# Patient Record
Sex: Male | Born: 1970
Health system: Southern US, Community
[De-identification: ages and names within clinical notes are randomized; demographics above are authoritative.]

## PROBLEM LIST (undated history)

## (undated) DIAGNOSIS — Z21 Asymptomatic human immunodeficiency virus [HIV] infection status: Secondary | ICD-10-CM

## (undated) DIAGNOSIS — B20 Human immunodeficiency virus [HIV] disease: Secondary | ICD-10-CM

## (undated) DIAGNOSIS — R0600 Dyspnea, unspecified: Secondary | ICD-10-CM

## (undated) DIAGNOSIS — Z992 Dependence on renal dialysis: Secondary | ICD-10-CM

## (undated) DIAGNOSIS — F419 Anxiety disorder, unspecified: Secondary | ICD-10-CM

## (undated) DIAGNOSIS — I10 Essential (primary) hypertension: Secondary | ICD-10-CM

## (undated) DIAGNOSIS — E1140 Type 2 diabetes mellitus with diabetic neuropathy, unspecified: Secondary | ICD-10-CM

## (undated) DIAGNOSIS — E119 Type 2 diabetes mellitus without complications: Secondary | ICD-10-CM

## (undated) DIAGNOSIS — N186 End stage renal disease: Secondary | ICD-10-CM

## (undated) DIAGNOSIS — N189 Chronic kidney disease, unspecified: Secondary | ICD-10-CM

## (undated) HISTORY — DX: Essential (primary) hypertension: I10

## (undated) HISTORY — PX: AV FISTULA PLACEMENT: SHX1204

## (undated) HISTORY — DX: Asymptomatic human immunodeficiency virus (hiv) infection status: Z21

## (undated) HISTORY — DX: Human immunodeficiency virus (HIV) disease: B20

## (undated) SURGERY — ABDOMINAL AORTOGRAM
Anesthesia: Moderate Sedation | Laterality: Right

---

## 2013-04-24 ENCOUNTER — Ambulatory Visit: Payer: Self-pay

## 2013-05-01 ENCOUNTER — Ambulatory Visit: Payer: Self-pay

## 2013-05-02 ENCOUNTER — Telehealth: Payer: Self-pay

## 2013-05-02 NOTE — Telephone Encounter (Signed)
Pt called he is not able to make his appointment on Tuesday, May 01, 2013 he would like to reschedule.   Left message to call with available dates for intakes.   Laverle Patter, RN

## 2013-07-06 ENCOUNTER — Telehealth: Payer: Self-pay

## 2013-07-06 NOTE — Telephone Encounter (Signed)
Patient contacted regarding new intake.  [address] Counselor has contacted him regarding two missed intake appointments and he has assured her he will come for office visit.    Appointment given to patient.  He is un insured .  Explained Financial counselor will be calling to give him information regarding services offered.   Laverle Patter, RN

## 2013-07-17 ENCOUNTER — Ambulatory Visit: Payer: Self-pay

## 2013-07-19 ENCOUNTER — Ambulatory Visit (INDEPENDENT_AMBULATORY_CARE_PROVIDER_SITE_OTHER): Payer: Self-pay

## 2013-07-19 ENCOUNTER — Ambulatory Visit: Payer: Self-pay

## 2013-07-19 DIAGNOSIS — B20 Human immunodeficiency virus [HIV] disease: Secondary | ICD-10-CM

## 2013-07-19 DIAGNOSIS — Z79899 Other long term (current) drug therapy: Secondary | ICD-10-CM

## 2013-07-19 DIAGNOSIS — Z113 Encounter for screening for infections with a predominantly sexual mode of transmission: Secondary | ICD-10-CM

## 2013-07-19 DIAGNOSIS — E119 Type 2 diabetes mellitus without complications: Secondary | ICD-10-CM

## 2013-07-19 DIAGNOSIS — Z23 Encounter for immunization: Secondary | ICD-10-CM

## 2013-07-19 LAB — CBC WITH DIFFERENTIAL/PLATELET
Eosinophils Relative: 1 % (ref 0–5)
HCT: 36.7 % — ABNORMAL LOW (ref 39.0–52.0)
Hemoglobin: 12.2 g/dL — ABNORMAL LOW (ref 13.0–17.0)
Lymphocytes Relative: 44 % (ref 12–46)
Lymphs Abs: 3.1 10*3/uL (ref 0.7–4.0)
MCV: 83 fL (ref 78.0–100.0)
Monocytes Absolute: 0.8 10*3/uL (ref 0.1–1.0)
Monocytes Relative: 11 % (ref 3–12)
RBC: 4.42 MIL/uL (ref 4.22–5.81)
RDW: 15.1 % (ref 11.5–15.5)
WBC: 7 10*3/uL (ref 4.0–10.5)

## 2013-07-19 LAB — COMPLETE METABOLIC PANEL WITH GFR
BUN: 14 mg/dL (ref 6–23)
CO2: 26 mEq/L (ref 19–32)
Calcium: 8.8 mg/dL (ref 8.4–10.5)
Chloride: 98 mEq/L (ref 96–112)
Creat: 1.08 mg/dL (ref 0.50–1.35)
GFR, Est African American: >89 mL/min

## 2013-07-19 LAB — LIPID PANEL
Cholesterol: 156 mg/dL (ref 0–200)
HDL: 34 mg/dL — ABNORMAL LOW (ref >39)
LDL Cholesterol: 62 mg/dL (ref 0–99)
Triglycerides: 299 mg/dL — ABNORMAL HIGH (ref <150)

## 2013-07-20 LAB — URINALYSIS
Bilirubin Urine: NEGATIVE
Ketones, ur: NEGATIVE mg/dL
Nitrite: NEGATIVE
Specific Gravity, Urine: 1.026 (ref 1.005–1.030)
pH: 5.5 (ref 5.0–8.0)

## 2013-07-20 LAB — T-HELPER CELL (CD4) - (RCID CLINIC ONLY)
CD4 % Helper T Cell: 9 % — ABNORMAL LOW (ref 33–55)
CD4 T Cell Abs: 320 /uL — ABNORMAL LOW (ref 400–2700)

## 2013-07-20 LAB — HEPATITIS A ANTIBODY, TOTAL: Hep A Total Ab: POSITIVE — AB

## 2013-07-20 LAB — HEMOGLOBIN A1C: Mean Plasma Glucose: 367 mg/dL — ABNORMAL HIGH (ref <117)

## 2013-07-20 LAB — RPR

## 2013-07-20 LAB — HEPATITIS B SURFACE ANTIGEN: Hepatitis B Surface Ag: NEGATIVE

## 2013-07-24 LAB — TB SKIN TEST: Induration: 0 mm

## 2013-07-24 NOTE — Progress Notes (Signed)
Patient states he was not informed of his HIV status prior to discharge from hospital in March, 2014. He only found out later when seeing a primary care physician for diabetes.  He has not been in care for HIV due to lack of insurance and a recent move to [place].   He has not been in care for his hypertension or diabetes and  has been borrowing insulin from a friend . He has not checked his blood sugar in several months. There is a  sore present left lower leg that has not healed for a least one month per patient report.  He will need  assitance with obtaining insulin and blood glucose machine. I think he would benefit from seeing Internal Medicine for assistance with diabetes and hypertension. Vaccines given.    Laverle Patter, RN

## 2013-07-25 LAB — HIV-1 GENOTYPR PLUS

## 2013-08-07 ENCOUNTER — Ambulatory Visit: Payer: Self-pay | Admitting: Internal Medicine

## 2013-08-07 ENCOUNTER — Ambulatory Visit: Payer: Self-pay

## 2013-08-10 ENCOUNTER — Telehealth: Payer: Self-pay

## 2013-08-10 NOTE — Telephone Encounter (Signed)
Patient came in for intake on 07/19/13 - had some of his financial docouments, heleft one job and went right to next one and stated was going to fax or email his other paystubsW2/2013 taxes - called several times and left vm messages - he did not return calls or show for Dr. Hilaria Ota on 9/30.

## 2013-09-17 ENCOUNTER — Telehealth: Payer: Self-pay

## 2013-09-17 NOTE — Telephone Encounter (Signed)
Patient missed appointment with provider.  Informed Photographer.  Laverle Patter, RN

## 2014-04-09 ENCOUNTER — Telehealth: Payer: Self-pay

## 2014-04-09 NOTE — Telephone Encounter (Signed)
Patient did not show for office visit. [place] Counselor Referral.   Laverle Patter, RN

## 2015-03-05 DIAGNOSIS — E11628 Type 2 diabetes mellitus with other skin complications: Secondary | ICD-10-CM | POA: Insufficient documentation

## 2015-03-05 DIAGNOSIS — L089 Local infection of the skin and subcutaneous tissue, unspecified: Secondary | ICD-10-CM | POA: Insufficient documentation

## 2015-03-05 DIAGNOSIS — E1065 Type 1 diabetes mellitus with hyperglycemia: Secondary | ICD-10-CM | POA: Insufficient documentation

## 2015-03-05 DIAGNOSIS — R809 Proteinuria, unspecified: Secondary | ICD-10-CM | POA: Insufficient documentation

## 2015-03-05 DIAGNOSIS — D509 Iron deficiency anemia, unspecified: Secondary | ICD-10-CM | POA: Insufficient documentation

## 2015-03-09 DIAGNOSIS — K8020 Calculus of gallbladder without cholecystitis without obstruction: Secondary | ICD-10-CM | POA: Insufficient documentation

## 2015-03-19 ENCOUNTER — Ambulatory Visit: Payer: Self-pay | Admitting: Family Medicine

## 2016-05-31 DIAGNOSIS — R069 Unspecified abnormalities of breathing: Secondary | ICD-10-CM | POA: Insufficient documentation

## 2016-05-31 DIAGNOSIS — Z95828 Presence of other vascular implants and grafts: Secondary | ICD-10-CM | POA: Insufficient documentation

## 2016-05-31 DIAGNOSIS — E213 Hyperparathyroidism, unspecified: Secondary | ICD-10-CM | POA: Insufficient documentation

## 2016-05-31 DIAGNOSIS — R52 Pain, unspecified: Secondary | ICD-10-CM | POA: Insufficient documentation

## 2016-05-31 DIAGNOSIS — N186 End stage renal disease: Secondary | ICD-10-CM | POA: Insufficient documentation

## 2016-05-31 DIAGNOSIS — Z23 Encounter for immunization: Secondary | ICD-10-CM | POA: Insufficient documentation

## 2016-05-31 DIAGNOSIS — D509 Iron deficiency anemia, unspecified: Secondary | ICD-10-CM | POA: Insufficient documentation

## 2016-05-31 DIAGNOSIS — L299 Pruritus, unspecified: Secondary | ICD-10-CM | POA: Insufficient documentation

## 2016-05-31 DIAGNOSIS — N189 Chronic kidney disease, unspecified: Secondary | ICD-10-CM | POA: Insufficient documentation

## 2016-05-31 DIAGNOSIS — D631 Anemia in chronic kidney disease: Secondary | ICD-10-CM | POA: Insufficient documentation

## 2016-05-31 DIAGNOSIS — R509 Fever, unspecified: Secondary | ICD-10-CM | POA: Insufficient documentation

## 2016-05-31 DIAGNOSIS — D689 Coagulation defect, unspecified: Secondary | ICD-10-CM | POA: Insufficient documentation

## 2016-05-31 DIAGNOSIS — E1122 Type 2 diabetes mellitus with diabetic chronic kidney disease: Secondary | ICD-10-CM | POA: Insufficient documentation

## 2016-05-31 DIAGNOSIS — B20 Human immunodeficiency virus [HIV] disease: Secondary | ICD-10-CM | POA: Insufficient documentation

## 2016-05-31 DIAGNOSIS — I158 Other secondary hypertension: Secondary | ICD-10-CM | POA: Insufficient documentation

## 2016-07-06 DIAGNOSIS — E875 Hyperkalemia: Secondary | ICD-10-CM | POA: Insufficient documentation

## 2016-07-08 DIAGNOSIS — E440 Moderate protein-calorie malnutrition: Secondary | ICD-10-CM | POA: Insufficient documentation

## 2016-07-14 DIAGNOSIS — E8779 Other fluid overload: Secondary | ICD-10-CM | POA: Insufficient documentation

## 2016-07-15 ENCOUNTER — Other Ambulatory Visit: Payer: Self-pay | Admitting: Vascular Surgery

## 2016-07-19 ENCOUNTER — Ambulatory Visit: Admission: RE | Admit: 2016-07-19 | Payer: Medicaid Other | Source: Ambulatory Visit | Admitting: Vascular Surgery

## 2016-07-19 SURGERY — DIALYSIS/PERMA CATHETER REMOVAL
Anesthesia: Moderate Sedation

## 2016-08-09 ENCOUNTER — Other Ambulatory Visit
Admission: RE | Admit: 2016-08-09 | Discharge: 2016-08-09 | Disposition: A | Discharge status: Home / Self Care | Payer: Medicaid Other | Source: Ambulatory Visit | Attending: Nephrology | Admitting: Nephrology

## 2016-08-09 DIAGNOSIS — E875 Hyperkalemia: Secondary | ICD-10-CM | POA: Diagnosis not present

## 2016-08-09 LAB — POTASSIUM: Potassium: 5.6 mmol/L — ABNORMAL HIGH (ref 3.5–5.1)

## 2016-08-30 DIAGNOSIS — A528 Late syphilis, latent: Secondary | ICD-10-CM | POA: Insufficient documentation

## 2016-10-26 ENCOUNTER — Other Ambulatory Visit
Admission: RE | Admit: 2016-10-26 | Discharge: 2016-10-26 | Disposition: A | Discharge status: Home / Self Care | Payer: Medicaid Other | Source: Ambulatory Visit | Attending: Nephrology | Admitting: Nephrology

## 2016-10-26 DIAGNOSIS — E875 Hyperkalemia: Secondary | ICD-10-CM | POA: Insufficient documentation

## 2016-10-26 LAB — POTASSIUM: POTASSIUM: 5.8 mmol/L — AB (ref 3.5–5.1)

## 2016-12-24 DIAGNOSIS — K029 Dental caries, unspecified: Secondary | ICD-10-CM | POA: Insufficient documentation

## 2017-01-31 ENCOUNTER — Encounter: Payer: Self-pay | Admitting: *Deleted

## 2017-01-31 ENCOUNTER — Emergency Department: Payer: Medicaid Other

## 2017-01-31 ENCOUNTER — Inpatient Hospital Stay
Admission: EM | Admit: 2017-01-31 | Discharge: 2017-02-08 | DRG: 969 | Disposition: A | Discharge status: Home Health | Payer: Medicaid Other | Attending: Internal Medicine | Admitting: Internal Medicine

## 2017-01-31 DIAGNOSIS — Z794 Long term (current) use of insulin: Secondary | ICD-10-CM | POA: Diagnosis not present

## 2017-01-31 DIAGNOSIS — E1140 Type 2 diabetes mellitus with diabetic neuropathy, unspecified: Secondary | ICD-10-CM | POA: Diagnosis present

## 2017-01-31 DIAGNOSIS — E440 Moderate protein-calorie malnutrition: Secondary | ICD-10-CM | POA: Insufficient documentation

## 2017-01-31 DIAGNOSIS — E1169 Type 2 diabetes mellitus with other specified complication: Secondary | ICD-10-CM | POA: Diagnosis present

## 2017-01-31 DIAGNOSIS — I120 Hypertensive chronic kidney disease with stage 5 chronic kidney disease or end stage renal disease: Secondary | ICD-10-CM | POA: Diagnosis present

## 2017-01-31 DIAGNOSIS — L97526 Non-pressure chronic ulcer of other part of left foot with bone involvement without evidence of necrosis: Secondary | ICD-10-CM | POA: Diagnosis present

## 2017-01-31 DIAGNOSIS — A419 Sepsis, unspecified organism: Secondary | ICD-10-CM | POA: Diagnosis present

## 2017-01-31 DIAGNOSIS — D631 Anemia in chronic kidney disease: Secondary | ICD-10-CM | POA: Diagnosis present

## 2017-01-31 DIAGNOSIS — N186 End stage renal disease: Secondary | ICD-10-CM | POA: Diagnosis present

## 2017-01-31 DIAGNOSIS — M869 Osteomyelitis, unspecified: Secondary | ICD-10-CM | POA: Diagnosis present

## 2017-01-31 DIAGNOSIS — E1122 Type 2 diabetes mellitus with diabetic chronic kidney disease: Secondary | ICD-10-CM | POA: Diagnosis present

## 2017-01-31 DIAGNOSIS — E11621 Type 2 diabetes mellitus with foot ulcer: Secondary | ICD-10-CM | POA: Diagnosis present

## 2017-01-31 DIAGNOSIS — Z7982 Long term (current) use of aspirin: Secondary | ICD-10-CM | POA: Diagnosis not present

## 2017-01-31 DIAGNOSIS — Z79899 Other long term (current) drug therapy: Secondary | ICD-10-CM

## 2017-01-31 DIAGNOSIS — Z833 Family history of diabetes mellitus: Secondary | ICD-10-CM | POA: Diagnosis not present

## 2017-01-31 DIAGNOSIS — B20 Human immunodeficiency virus [HIV] disease: Secondary | ICD-10-CM | POA: Diagnosis present

## 2017-01-31 DIAGNOSIS — S91309A Unspecified open wound, unspecified foot, initial encounter: Secondary | ICD-10-CM

## 2017-01-31 DIAGNOSIS — R778 Other specified abnormalities of plasma proteins: Secondary | ICD-10-CM

## 2017-01-31 DIAGNOSIS — N2581 Secondary hyperparathyroidism of renal origin: Secondary | ICD-10-CM | POA: Diagnosis present

## 2017-01-31 DIAGNOSIS — R7989 Other specified abnormal findings of blood chemistry: Secondary | ICD-10-CM

## 2017-01-31 DIAGNOSIS — Z8249 Family history of ischemic heart disease and other diseases of the circulatory system: Secondary | ICD-10-CM | POA: Diagnosis not present

## 2017-01-31 DIAGNOSIS — L089 Local infection of the skin and subcutaneous tissue, unspecified: Secondary | ICD-10-CM | POA: Diagnosis present

## 2017-01-31 DIAGNOSIS — L97523 Non-pressure chronic ulcer of other part of left foot with necrosis of muscle: Secondary | ICD-10-CM

## 2017-01-31 DIAGNOSIS — Z992 Dependence on renal dialysis: Secondary | ICD-10-CM

## 2017-01-31 HISTORY — DX: End stage renal disease: Z99.2

## 2017-01-31 HISTORY — DX: Type 2 diabetes mellitus without complications: E11.9

## 2017-01-31 HISTORY — DX: Type 2 diabetes mellitus with diabetic neuropathy, unspecified: E11.40

## 2017-01-31 HISTORY — DX: End stage renal disease: N18.6

## 2017-01-31 LAB — CBC WITH DIFFERENTIAL/PLATELET
Basophils Absolute: 0 10*3/uL (ref 0–0.1)
Basophils Relative: 0 %
EOS PCT: 0 %
Eosinophils Absolute: 0 10*3/uL (ref 0–0.7)
HEMATOCRIT: 34.2 % — AB (ref 40.0–52.0)
HEMOGLOBIN: 11.5 g/dL — AB (ref 13.0–18.0)
LYMPHS ABS: 1 10*3/uL (ref 1.0–3.6)
LYMPHS PCT: 6 %
MCH: 29.8 pg (ref 26.0–34.0)
MCHC: 33.6 g/dL (ref 32.0–36.0)
MCV: 88.7 fL (ref 80.0–100.0)
MONOS PCT: 6 %
Monocytes Absolute: 0.9 10*3/uL (ref 0.2–1.0)
NEUTROS ABS: 13.3 10*3/uL — AB (ref 1.4–6.5)
Neutrophils Relative %: 88 %
Platelets: 237 10*3/uL (ref 150–440)
RBC: 3.86 MIL/uL — ABNORMAL LOW (ref 4.40–5.90)
RDW: 17.5 % — AB (ref 11.5–14.5)
WBC: 15.2 10*3/uL — ABNORMAL HIGH (ref 3.8–10.6)

## 2017-01-31 LAB — COMPREHENSIVE METABOLIC PANEL
ALBUMIN: 3.3 g/dL — AB (ref 3.5–5.0)
ALT: 10 U/L — ABNORMAL LOW (ref 17–63)
ANION GAP: 10 (ref 5–15)
AST: 11 U/L — AB (ref 15–41)
Alkaline Phosphatase: 68 U/L (ref 38–126)
BILIRUBIN TOTAL: 0.7 mg/dL (ref 0.3–1.2)
BUN: 37 mg/dL — AB (ref 6–20)
CO2: 28 mmol/L (ref 22–32)
Calcium: 8.7 mg/dL — ABNORMAL LOW (ref 8.9–10.3)
Chloride: 94 mmol/L — ABNORMAL LOW (ref 101–111)
Creatinine, Ser: 9.34 mg/dL — ABNORMAL HIGH (ref 0.61–1.24)
GFR calc Af Amer: 7 mL/min — ABNORMAL LOW (ref >60)
GFR calc non Af Amer: 6 mL/min — ABNORMAL LOW (ref >60)
GLUCOSE: 182 mg/dL — AB (ref 65–99)
POTASSIUM: 4 mmol/L (ref 3.5–5.1)
SODIUM: 132 mmol/L — AB (ref 135–145)
Total Protein: 8.9 g/dL — ABNORMAL HIGH (ref 6.5–8.1)

## 2017-01-31 LAB — GLUCOSE, CAPILLARY: Glucose-Capillary: 203 mg/dL — ABNORMAL HIGH (ref 65–99)

## 2017-01-31 LAB — TROPONIN I: Troponin I: 0.03 ng/mL (ref <0.03)

## 2017-01-31 LAB — PROCALCITONIN: Procalcitonin: 127.27 ng/mL

## 2017-01-31 LAB — PROTIME-INR
INR: 1.23
PROTHROMBIN TIME: 15.6 s — AB (ref 11.4–15.2)

## 2017-01-31 LAB — LIPASE, BLOOD: Lipase: 11 U/L (ref 11–51)

## 2017-01-31 LAB — LACTIC ACID, PLASMA: Lactic Acid, Venous: 1.6 mmol/L (ref 0.5–1.9)

## 2017-01-31 MED ORDER — LOSARTAN POTASSIUM 50 MG PO TABS
100.0000 mg | ORAL_TABLET | Freq: Every day | ORAL | Status: DC
  Administered 2017-02-01 – 2017-02-08 (×8): 100 mg via ORAL
  Filled 2017-01-31 (×8): qty 2

## 2017-01-31 MED ORDER — ACETAMINOPHEN 650 MG RE SUPP: 650.0000 mg | Freq: Four times a day (QID) | RECTAL | Status: DC | PRN

## 2017-01-31 MED ORDER — VANCOMYCIN HCL IN DEXTROSE 1-5 GM/200ML-% IV SOLN
1000.0000 mg | Freq: Once | INTRAVENOUS | Status: AC
  Administered 2017-01-31: 1000 mg via INTRAVENOUS
  Filled 2017-01-31: qty 200

## 2017-01-31 MED ORDER — HYDRALAZINE HCL 50 MG PO TABS: 25.0000 mg | ORAL_TABLET | Freq: Three times a day (TID) | ORAL | Status: DC

## 2017-01-31 MED ORDER — DOLUTEGRAVIR SODIUM 50 MG PO TABS
50.0000 mg | ORAL_TABLET | Freq: Two times a day (BID) | ORAL | Status: DC
Start: 2017-01-31 — End: 2017-02-08
  Administered 2017-01-31 – 2017-02-08 (×15): 50 mg via ORAL
  Filled 2017-01-31 (×15): qty 1

## 2017-01-31 MED ORDER — MINOXIDIL 2.5 MG PO TABS
2.5000 mg | ORAL_TABLET | Freq: Two times a day (BID) | ORAL | Status: DC
  Administered 2017-02-01 – 2017-02-08 (×11): 2.5 mg via ORAL
  Filled 2017-01-31 (×15): qty 1

## 2017-01-31 MED ORDER — PIPERACILLIN-TAZOBACTAM 3.375 G IVPB
3.3750 g | Freq: Two times a day (BID) | INTRAVENOUS | Status: DC
  Administered 2017-02-01 – 2017-02-08 (×15): 3.375 g via INTRAVENOUS
  Filled 2017-01-31 (×16): qty 50

## 2017-01-31 MED ORDER — CARVEDILOL 25 MG PO TABS
25.0000 mg | ORAL_TABLET | Freq: Two times a day (BID) | ORAL | Status: DC
  Administered 2017-02-01 – 2017-02-08 (×12): 25 mg via ORAL
  Filled 2017-01-31 (×14): qty 1

## 2017-01-31 MED ORDER — INSULIN ASPART 100 UNIT/ML ~~LOC~~ SOLN
0.0000 [IU] | Freq: Three times a day (TID) | SUBCUTANEOUS | Status: DC
  Administered 2017-02-01: 1 [IU] via SUBCUTANEOUS
  Administered 2017-02-01 – 2017-02-02 (×2): 2 [IU] via SUBCUTANEOUS
  Administered 2017-02-03 – 2017-02-07 (×6): 1 [IU] via SUBCUTANEOUS
  Administered 2017-02-08: 2 [IU] via SUBCUTANEOUS
  Filled 2017-01-31: qty 1
  Filled 2017-01-31: qty 2
  Filled 2017-01-31 (×2): qty 1
  Filled 2017-01-31 (×2): qty 2
  Filled 2017-01-31 (×2): qty 1
  Filled 2017-01-31 (×2): qty 2
  Filled 2017-01-31: qty 1

## 2017-01-31 MED ORDER — PIPERACILLIN-TAZOBACTAM 3.375 G IVPB 30 MIN
3.3750 g | Freq: Once | INTRAVENOUS | Status: AC
  Administered 2017-01-31: 3.375 g via INTRAVENOUS
  Filled 2017-01-31: qty 50

## 2017-01-31 MED ORDER — SODIUM CHLORIDE 0.9 % IV BOLUS (SEPSIS): 1000.0000 mL | Freq: Once | INTRAVENOUS | Status: DC

## 2017-01-31 MED ORDER — ONDANSETRON HCL 4 MG PO TABS: 4.0000 mg | ORAL_TABLET | Freq: Four times a day (QID) | ORAL | Status: DC | PRN

## 2017-01-31 MED ORDER — SENNOSIDES-DOCUSATE SODIUM 8.6-50 MG PO TABS: 1.0000 | ORAL_TABLET | Freq: Every evening | ORAL | Status: DC | PRN

## 2017-01-31 MED ORDER — INSULIN ASPART 100 UNIT/ML ~~LOC~~ SOLN
0.0000 [IU] | Freq: Every day | SUBCUTANEOUS | Status: DC
  Administered 2017-01-31: 2 [IU] via SUBCUTANEOUS
  Filled 2017-01-31: qty 2

## 2017-01-31 MED ORDER — ABACAVIR SULFATE 300 MG PO TABS
300.0000 mg | ORAL_TABLET | Freq: Two times a day (BID) | ORAL | Status: DC
  Administered 2017-01-31 – 2017-02-08 (×15): 300 mg via ORAL
  Filled 2017-01-31 (×16): qty 1

## 2017-01-31 MED ORDER — SODIUM CHLORIDE 0.9 % IV BOLUS (SEPSIS): 500.0000 mL | Freq: Once | INTRAVENOUS | Status: DC

## 2017-01-31 MED ORDER — ALBUTEROL SULFATE (2.5 MG/3ML) 0.083% IN NEBU: 2.5000 mg | INHALATION_SOLUTION | RESPIRATORY_TRACT | Status: DC | PRN

## 2017-01-31 MED ORDER — ACETAMINOPHEN 325 MG PO TABS: 650.0000 mg | ORAL_TABLET | Freq: Four times a day (QID) | ORAL | Status: DC | PRN

## 2017-01-31 MED ORDER — SODIUM CHLORIDE 0.9% FLUSH: 3.0000 mL | INTRAVENOUS | Status: DC | PRN

## 2017-01-31 MED ORDER — LAMIVUDINE 10 MG/ML PO SOLN
25.0000 mg | Freq: Every day | ORAL | Status: DC
  Administered 2017-02-01 – 2017-02-07 (×7): 25 mg via ORAL
  Filled 2017-01-31 (×10): qty 5

## 2017-01-31 MED ORDER — SODIUM CHLORIDE 0.9 % IV BOLUS (SEPSIS)
1000.0000 mL | Freq: Once | INTRAVENOUS | Status: AC
  Administered 2017-01-31: 1000 mL via INTRAVENOUS

## 2017-01-31 MED ORDER — SODIUM CHLORIDE 0.9 % IV SOLN
250.0000 mL | INTRAVENOUS | Status: DC | PRN
  Administered 2017-02-04: 13:00:00 via INTRAVENOUS

## 2017-01-31 MED ORDER — HYDROCODONE-ACETAMINOPHEN 5-325 MG PO TABS
1.0000 | ORAL_TABLET | ORAL | Status: DC | PRN
  Administered 2017-02-05 – 2017-02-08 (×3): 2 via ORAL
  Filled 2017-01-31 (×3): qty 2

## 2017-01-31 MED ORDER — ONDANSETRON HCL 4 MG/2ML IJ SOLN: 4.0000 mg | Freq: Four times a day (QID) | INTRAMUSCULAR | Status: DC | PRN

## 2017-01-31 MED ORDER — CALCIUM ACETATE (PHOS BINDER) 667 MG PO CAPS
667.0000 mg | ORAL_CAPSULE | Freq: Three times a day (TID) | ORAL | Status: DC
  Administered 2017-02-01 – 2017-02-06 (×11): 667 mg via ORAL
  Filled 2017-01-31 (×11): qty 1

## 2017-01-31 MED ORDER — ASPIRIN 81 MG PO CHEW
81.0000 mg | CHEWABLE_TABLET | Freq: Every day | ORAL | Status: DC
  Administered 2017-02-01 – 2017-02-08 (×8): 81 mg via ORAL
  Filled 2017-01-31 (×8): qty 1

## 2017-01-31 MED ORDER — HEPARIN SODIUM (PORCINE) 5000 UNIT/ML IJ SOLN
5000.0000 [IU] | Freq: Three times a day (TID) | INTRAMUSCULAR | Status: DC
Start: 2017-01-31 — End: 2017-02-08
  Administered 2017-01-31 – 2017-02-08 (×19): 5000 [IU] via SUBCUTANEOUS
  Filled 2017-01-31 (×21): qty 1

## 2017-01-31 MED ORDER — SODIUM CHLORIDE 0.9% FLUSH
3.0000 mL | Freq: Two times a day (BID) | INTRAVENOUS | Status: DC
  Administered 2017-01-31 – 2017-02-08 (×13): 3 mL via INTRAVENOUS

## 2017-01-31 NOTE — ED Notes (Signed)
Spoke with lab regarding blood work sent at approximately 1545. Reports they just received it and will process it now. MD made aware.

## 2017-01-31 NOTE — ED Triage Notes (Addendum)
Pt to triage via wheelchair.  Pt reports fever, chills, cough today.  Pt received only part of  dialysis today.  Pt has a cough.  Pt alert. Pt took tylenol at 1100am today.  Pt now reports an ulcer on left foot.   Pt is diabetic.  Pt taken to room 11 from triage.

## 2017-01-31 NOTE — ED Notes (Signed)
NAD. Waiting on admit bed. Pt alert and oriented. No needs at this time.

## 2017-01-31 NOTE — Progress Notes (Signed)
Pharmacy Antibiotic Note  Nathan Ayers is a 46 y.o. male admitted on 01/31/2017 with sepsis.  Pharmacy has been consulted for vancomycin and piperacillin/tazobactam dosing.  Patient has ESRD on HD.  Plan: Patient received vancomycin 1000 mg at 1600. Will order another 1000 mg dose to be given NOW for total loading dose of 2000 mg. Pharmacy will need to follow HD schedule and order 1000 mg to be given at the end of each HD session  Goal vancomycin level 15-25 mcg/mL pre HD - to be checked prior to 3rd session per protocol.  Piperacillin/tazobactam 3.375 g IV q12h EI  Height: 6' 6"  (198.1 cm) Weight: 222 lb 10.6 oz (101 kg) IBW/kg (Calculated) : 91.4  Temp (24hrs), Avg:103.1 F (39.5 C), Min:103.1 F (39.5 C), Max:103.1 F (39.5 C)   Recent Labs Lab 01/31/17 1600  WBC 15.2*  CREATININE 9.34*  LATICACIDVEN 1.6    Estimated Creatinine Clearance: 13.2 mL/min (A) (by C-G formula based on SCr of 9.34 mg/dL (H)).    No Known Allergies  Antimicrobials this admission: Piperacillin/tazobactam 3/26 >>  vancomycin 3/26 >>   Dose adjustments this admission:  Microbiology results: 3/26 BCx: Sent 3/26 UCx: Sent   Thank you for allowing pharmacy to be a part of this patient's care.  Lenis Noon, PharmD Clinical Pharmacist 01/31/2017 6:44 PM

## 2017-01-31 NOTE — ED Notes (Signed)
Code sepsis called to [facility] at [place]

## 2017-01-31 NOTE — ED Notes (Signed)
Code sepsis called to [facility] at [place]

## 2017-01-31 NOTE — ED Provider Notes (Signed)
[facility] Emergency Department Provider Note  ____________________________________________   First MD Initiated Contact with Patient 01/31/17 1523     (approximate)  I have reviewed the triage vital signs and the nursing notes.   HISTORY  Chief Complaint Fever and Code Sepsis    HPI Nathan Ayers is a 46 y.o. male whose past medical history includes HIV infection on antiretroviral meds, chronic kidney disease on dialysis, and diabetes with chronic neuropathy who receives all of his care at [facility] including infectious disease.  He presents today for evaluation of fever, chills,and a large wound on his left foot that is apparently been present and gradually worsening for at least weeks if not months.  He started having chills but no fever as of about 4 days ago.  Over the last couple of days he started having fevers and today was as high as 104.  He has been nauseated and had several episodes of vomiting.  He has had a mild cough but denies difficulty breathing and denies chest pain.  He had a short course of dialysis today before they sent him here for further evaluation of his fever.  He reports that the wound on the bottom of his foot was present about a year ago and got better.  He thinks it developed again over the last few weeks or even months but has not had anyone look at it.  It is open and draining a combination of liquid and purulent material.  He has severe neuropathy so he cannot feel it.  Nothing makes his symptoms better nor worse but he describes them as severe at this time.  He reports that his CD4 count and viral load were last checked about 6 months ago and they told him the viral load was undetectable.  He is not sure about the CD4 count at that time.   Past Medical History:  Diagnosis Date  . Chronic kidney disease on chronic dialysis ([place])   . Diabetes mellitus ([place])   . Diabetic neuropathy ([place])   . HIV infection ([place])   . Hypertension       Patient Active Problem List   Diagnosis Date Noted  . Sepsis ([place]) 01/31/2017    History reviewed. No pertinent surgical history.  Prior to Admission medications   Medication Sig Start Date End Date Taking? Authorizing Provider  abacavir (ZIAGEN) 300 MG tablet Take 1 tablet by mouth 2 (two) times daily. 11/12/16  Yes Historical Provider, MD  calcium acetate (PHOSLO) 667 MG capsule Take 1 capsule by mouth 3 (three) times daily. 11/06/16  Yes Historical Provider, MD  carvedilol (COREG) 25 MG tablet Take 1 tablet by mouth every 12 (twelve) hours. 01/02/17  Yes Historical Provider, MD  insulin aspart (NOVOLOG) 100 UNIT/ML injection Inject into the skin 3 (three) times daily with meals.   Yes Historical Provider, MD  lamiVUDine (EPIVIR) 10 MG/ML solution Take 2.5 mLs by mouth daily. Take 2.21m daily, after dialysis and on days of dialysis 01/02/17  Yes Historical Provider, MD  losartan (COZAAR) 100 MG tablet Take 100 mg by mouth daily. 06/28/16 06/28/17 Yes Historical Provider, MD  minoxidil (LONITEN) 2.5 MG tablet Take 2.5 mg by mouth 2 (two) times daily. 01/23/17  Yes Historical Provider, MD  ondansetron (ZOFRAN-ODT) 4 MG disintegrating tablet Take 1 tablet by mouth every 8 (eight) hours as needed. 02/24/16  Yes Historical Provider, MD  TIVICAY 50 MG tablet Take 1 tablet by mouth 2 (two) times daily. 01/02/17  Yes  Historical Provider, MD  aspirin 81 MG chewable tablet Chew 81 mg by mouth daily.    Historical Provider, MD  hydrALAZINE (APRESOLINE) 25 MG tablet Take 1 tablet by mouth every 8 (eight) hours. 02/24/16 02/23/17  Historical Provider, MD    Allergies Patient has no known allergies.  Family History  Problem Relation Age of Onset  . Diabetes Mother   . Hypertension Mother     Social History Social History  Substance Use Topics  . Smoking status: Never Smoker  . Smokeless tobacco: Never Used  . Alcohol use No    Review of Systems Constitutional: +fever/chills, MAXIMUM  TEMPERATURE of 104 prior to arrival and 103 and ED triage Eyes: No visual changes. ENT: No sore throat. Cardiovascular: Denies chest pain. Respiratory: Denies shortness of breath.  Mild cough Gastrointestinal: No abdominal pain.  Several episodes of nausea and vomiting.  No diarrhea.  No constipation. Genitourinary: Negative for dysuria. Musculoskeletal: Negative for back pain. Skin: Negative for rash.  Large wound on the bottom of his left foot Neurological: Mild global headache.  No focal weakness.  Chronic peripheral neuropathy  10-point ROS otherwise negative.  ____________________________________________   PHYSICAL EXAM:  VITAL SIGNS: ED Triage Vitals  Enc Vitals Group     BP 01/31/17 1507 (!) 113/48     Pulse Rate 01/31/17 1507 (!) 107     Resp 01/31/17 1507 20     Temp 01/31/17 1507 (!) 103.1 F (39.5 C)     Temp Source 01/31/17 1507 Oral     SpO2 01/31/17 1507 99 %     Weight 01/31/17 1507 222 lb 10.6 oz (101 kg)     Height 01/31/17 1507 6' 6"  (1.981 m)     Head Circumference --      Peak Flow --      Pain Score 01/31/17 1506 0     Pain Loc --      Pain Edu? --      Excl. in [place]? --     Constitutional: Alert and oriented. Ill appearing but does not appear toxic. Eyes: Conjunctivae are normal. PERRL. EOMI. Head: Atraumatic. Nose: No congestion/rhinnorhea. Mouth/Throat: Mucous membranes are moist. Neck: No stridor.  No meningeal signs.   Cardiovascular: Normal rate, regular rhythm. Good peripheral circulation. Grossly normal heart sounds. Respiratory: Normal respiratory effort.  No retractions. Lungs CTAB. Gastrointestinal: Soft and nontender. No distention.  Musculoskeletal: No lower extremity tenderness nor edema.  The patient has a large (approximately 4 cm in diameter) and deep chronic appearing wound on the bottom of his left foot.  It is oozing serosanguineous material as well as having some gangrenous-appearing tissue.   Neurologic:  Normal speech and  language. No gross focal neurologic deficits are appreciated except for chronic peripheral neuropathy. Psychiatric: Mood and affect are normal. Speech and behavior are normal.  ____________________________________________   LABS (all labs ordered are listed, but only abnormal results are displayed)  Labs Reviewed  PROTIME-INR - Abnormal; Notable for the following:       Result Value   Prothrombin Time 15.6 (*)    All other components within normal limits  TROPONIN I - Abnormal; Notable for the following:    Troponin I 0.03 (*)    All other components within normal limits  COMPREHENSIVE METABOLIC PANEL - Abnormal; Notable for the following:    Sodium 132 (*)    Chloride 94 (*)    Glucose, Bld 182 (*)    BUN 37 (*)    Creatinine, Ser  9.34 (*)    Calcium 8.7 (*)    Total Protein 8.9 (*)    Albumin 3.3 (*)    AST 11 (*)    ALT 10 (*)    GFR calc non Af Amer 6 (*)    GFR calc Af Amer 7 (*)    All other components within normal limits  CBC WITH DIFFERENTIAL/PLATELET - Abnormal; Notable for the following:    WBC 15.2 (*)    RBC 3.86 (*)    Hemoglobin 11.5 (*)    HCT 34.2 (*)    RDW 17.5 (*)    Neutro Abs 13.3 (*)    All other components within normal limits  CULTURE, BLOOD (ROUTINE X 2)  CULTURE, BLOOD (ROUTINE X 2)  AEROBIC/ANAEROBIC CULTURE (SURGICAL/DEEP WOUND)  LIPASE, BLOOD  PROCALCITONIN  LACTIC ACID, PLASMA  BASIC METABOLIC PANEL  CBC  HIV ANTIBODY (ROUTINE TESTING)  HEMOGLOBIN A1C   ____________________________________________  EKG  ED ECG REPORT I, Alisi Lupien, the attending physician, personally viewed and interpreted this ECG.  Date: 01/31/2017 EKG Time: 15:59 Rate: 103 Rhythm: borderline sinus tachycardia QRS Axis: normal Intervals: normal ST/T Wave abnormalities: Non-specific ST segment / T-wave changes, but no evidence of acute ischemia. Conduction Disturbances: none Narrative Interpretation:  unremarkable  ____________________________________________  RADIOLOGY   Dg Chest Portable 1 View  Result Date: 01/31/2017 CLINICAL DATA:  Code sepsis EXAM: PORTABLE CHEST 1 VIEW COMPARISON:  None. FINDINGS: Top-normal heart size. Normal mediastinal contour. No pneumothorax. No pleural effusion. Lungs appear clear, with no acute consolidative airspace disease and no pulmonary edema. IMPRESSION: No active disease. Electronically Signed   By: Ilona Sorrel M.D.   On: 01/31/2017 16:08   Dg Foot Complete Left  Result Date: 01/31/2017 CLINICAL DATA:  Ulcer overlying the distal aspect of the fifth metatarsal bone. Rule out osteomyelitis. EXAM: LEFT FOOT - COMPLETE 3+ VIEW COMPARISON:  None. FINDINGS: There is a large also or adjacent to the undersurface of the distal aspect of the fifth metatarsal bone and fifth MTP joints. There is no focal areas of bony destruction. No fractures or dislocation. IMPRESSION: 1. Large ulcer overlying the distal aspect of the fifth metatarsal. No underlying bony destruction identified. Electronically Signed   By: Kerby Moors M.D.   On: 01/31/2017 16:09    ____________________________________________   PROCEDURES  Critical Care performed: Yes, see critical care procedure note(s)   Procedure(s) performed:   .Critical Care Performed by: Hinda Kehr Authorized by: Hinda Kehr   Critical care provider statement:    Critical care time (minutes):  30   Critical care time was exclusive of:  Separately billable procedures and treating other patients   Critical care was necessary to treat or prevent imminent or life-threatening deterioration of the following conditions:  Sepsis   Critical care was time spent personally by me on the following activities:  Development of treatment plan with patient or surrogate, discussions with consultants, evaluation of patient's response to treatment, examination of patient, obtaining history from patient or surrogate, ordering  and performing treatments and interventions, ordering and review of laboratory studies, ordering and review of radiographic studies, pulse oximetry, re-evaluation of patient's condition and review of old charts      ____________________________________________   INITIAL IMPRESSION / [place] / ED COURSE  Pertinent labs & imaging results that were available during my care of the patient were reviewed by me and considered in my medical decision making (see chart for details).  The patient is not in septic  shock but does meet criteria for sepsis.  He is immunocompromised with HIV and febrile to 103 with an obvious wound on his foot suspicious for osteomyelitis.  I have made him a code sepsis and will treat aggressively with empiric antibiotics as well as IV fluids until his lactic acid results, but I need to be judicious with fluids given that he is a dialysis patient and did not receive a full course of dialysis today.  At this time he has no shortness of breath and no other signs of infectious processes.  He has no tenderness to palpation of his abdomen.  He has had no indication of influenza.  Because the patient receives all his care including infectious disease at [facility], I ask him his preference for transfer versus staying at [facility], and at this time he prefers to stay here.   Clinical Course as of Feb 01 1943  Mon Jan 31, 2017  1635 Normal CXR, no evidence of osteomyelitis on foot radiographs.  Awaiting lab results.  [CF]  1704 Leukocytosis, but lactic acid WNL.  Meets sepsis criteria and has HIV.  Will discuss with hospitalist.  Given no evidence of shock, will discontinue 1m/kg fluid bolus and stick with 1.5L target at this time given infection and persistent tachycardia.  No SOB.  Will discuss with hospitalist.  [CF]    Clinical Course User Index [CF] CHinda Kehr MD    ____________________________________________  FINAL CLINICAL  IMPRESSION(S) / ED DIAGNOSES  Final diagnoses:  Wound, open, foot  Sepsis, due to unspecified organism ([place]  HIV (human immunodeficiency virus infection) ([place]  Chronic kidney disease on chronic dialysis ([place]  Elevated troponin I level     MEDICATIONS GIVEN DURING THIS VISIT:  Medications  heparin injection 5,000 Units (not administered)  acetaminophen (TYLENOL) tablet 650 mg (not administered)    Or  acetaminophen (TYLENOL) suppository 650 mg (not administered)  ondansetron (ZOFRAN) tablet 4 mg (not administered)    Or  ondansetron (ZOFRAN) injection 4 mg (not administered)  albuterol (PROVENTIL) (2.5 MG/3ML) 0.083% nebulizer solution 2.5 mg (not administered)  sodium chloride flush (NS) 0.9 % injection 3 mL (not administered)  sodium chloride flush (NS) 0.9 % injection 3 mL (not administered)  0.9 %  sodium chloride infusion (not administered)  HYDROcodone-acetaminophen (NORCO/VICODIN) 5-325 MG per tablet 1-2 tablet (not administered)  senna-docusate (Senokot-S) tablet 1 tablet (not administered)  abacavir (ZIAGEN) tablet 300 mg (not administered)  aspirin chewable tablet 81 mg (not administered)  calcium acetate (PHOSLO) capsule 667 mg (not administered)  carvedilol (COREG) tablet 25 mg (not administered)  hydrALAZINE (APRESOLINE) tablet 25 mg (not administered)  lamiVUDine (EPIVIR) 10 MG/ML solution 25 mg (not administered)  losartan (COZAAR) tablet 100 mg (not administered)  minoxidil (LONITEN) tablet 2.5 mg (not administered)  dolutegravir (TIVICAY) tablet 50 mg (not administered)  insulin aspart (novoLOG) injection 0-9 Units (not administered)  insulin aspart (novoLOG) injection 0-5 Units (not administered)  piperacillin-tazobactam (ZOSYN) IVPB 3.375 g (not administered)  vancomycin (VANCOCIN) IVPB 1000 mg/200 mL premix (not administered)  sodium chloride 0.9 % bolus 1,000 mL (0 mLs Intravenous Stopped 01/31/17 1722)  piperacillin-tazobactam (ZOSYN) IVPB 3.375 g (0 g  Intravenous Stopped 01/31/17 1639)  vancomycin (VANCOCIN) IVPB 1000 mg/200 mL premix (0 mg Intravenous Stopped 01/31/17 1710)     NEW OUTPATIENT MEDICATIONS STARTED DURING THIS VISIT:  Current Discharge Medication List      Current Discharge Medication List      Current Discharge Medication List  Note:  This document was prepared using Dragon voice recognition software and may include unintentional dictation errors.    Hinda Kehr, MD 01/31/17 1944

## 2017-01-31 NOTE — ED Notes (Signed)
Report received 

## 2017-01-31 NOTE — Plan of Care (Signed)
Problem: Physical Regulation: Goal: Will remain free from infection Outcome: Progressing IV antibiotics started in ED

## 2017-01-31 NOTE — H&P (Signed)
[facility] NAME: Nathan Ayers    MR#:  [id number]  DATE OF BIRTH:  [birth year]  DATE OF ADMISSION:  01/31/2017  PRIMARY CARE PHYSICIAN: No PCP Per Patient   REQUESTING/REFERRING PHYSICIAN: Hinda Kehr, MD  CHIEF COMPLAINT:   Chief Complaint  Patient presents with  . Fever  . Code Sepsis   Fever and chills for several days. HISTORY OF PRESENT ILLNESS:  Nathan Ayers  is a 46 y.o. male with a known history of Hypertension, diabetes, diabetes neuropathy, ESRD on hemodialysis and HIV. The patient has had fever, chills for a few days. His temperature went up to 104 for the past couple of days. He also complains of nausea and vomiting several times. He denies any diarrhea, abdominal pain, melena or bloody stool. He has had a large wound on his left foot full at least weeks if not months. She got 2 hours hemodialysis today and then sent to the ED for further evaluation. He hasn't seen physician for long time. He said he is compliance to his medication. He was also found septic, given vancomycin and Zosyn in the ED.  PAST MEDICAL HISTORY:   Past Medical History:  Diagnosis Date  . Chronic kidney disease on chronic dialysis ([place])   . Diabetes mellitus ([place])   . Diabetic neuropathy ([place])   . HIV infection ([place])   . Hypertension     PAST SURGICAL HISTORY:  History reviewed. No pertinent surgical history.  SOCIAL HISTORY:   Social History  Substance Use Topics  . Smoking status: Never Smoker  . Smokeless tobacco: Never Used  . Alcohol use No    FAMILY HISTORY:   Family History  Problem Relation Age of Onset  . Diabetes Mother   . Hypertension Mother     DRUG ALLERGIES:  No Known Allergies  REVIEW OF SYSTEMS:   Review of Systems  Constitutional: Positive for chills, fever and malaise/fatigue.  HENT: Negative for congestion.   Eyes: Negative for blurred vision and double vision.  Respiratory: Negative for cough,  shortness of breath, wheezing and stridor.   Cardiovascular: Negative for chest pain, palpitations and leg swelling.  Gastrointestinal: Positive for nausea. Negative for abdominal pain, blood in stool, constipation, diarrhea, melena and vomiting.  Genitourinary: Negative for dysuria and hematuria.  Musculoskeletal: Negative for back pain.  Skin: Negative for itching and rash.  Neurological: Positive for weakness. Negative for dizziness, focal weakness, loss of consciousness and headaches.  Psychiatric/Behavioral: Negative for depression. The patient is not nervous/anxious.     MEDICATIONS AT HOME:   Prior to Admission medications   Medication Sig Start Date End Date Taking? Authorizing Provider  abacavir (ZIAGEN) 300 MG tablet Take 1 tablet by mouth 2 (two) times daily. 11/12/16  Yes Historical Provider, MD  calcium acetate (PHOSLO) 667 MG capsule Take 1 capsule by mouth 3 (three) times daily. 11/06/16  Yes Historical Provider, MD  carvedilol (COREG) 25 MG tablet Take 1 tablet by mouth every 12 (twelve) hours. 01/02/17  Yes Historical Provider, MD  insulin aspart (NOVOLOG) 100 UNIT/ML injection Inject into the skin 3 (three) times daily with meals.   Yes Historical Provider, MD  lamiVUDine (EPIVIR) 10 MG/ML solution Take 2.5 mLs by mouth daily. Take 2.71m daily, after dialysis and on days of dialysis 01/02/17  Yes Historical Provider, MD  losartan (COZAAR) 100 MG tablet Take 100 mg by mouth daily. 06/28/16 06/28/17 Yes Historical Provider, MD  minoxidil (LONITEN) 2.5 MG tablet  Take 2.5 mg by mouth 2 (two) times daily. 01/23/17  Yes Historical Provider, MD  ondansetron (ZOFRAN-ODT) 4 MG disintegrating tablet Take 1 tablet by mouth every 8 (eight) hours as needed. 02/24/16  Yes Historical Provider, MD  TIVICAY 50 MG tablet Take 1 tablet by mouth 2 (two) times daily. 01/02/17  Yes Historical Provider, MD  aspirin 81 MG chewable tablet Chew 81 mg by mouth daily.    Historical Provider, MD  hydrALAZINE  (APRESOLINE) 25 MG tablet Take 1 tablet by mouth every 8 (eight) hours. 02/24/16 02/23/17  Historical Provider, MD      VITAL SIGNS:  Blood pressure 115/65, pulse 99, temperature (!) 103.1 F (39.5 C), temperature source Oral, resp. rate 18, height 6' 6"  (1.981 m), weight 222 lb 10.6 oz (101 kg), SpO2 95 %.  PHYSICAL EXAMINATION:  Physical Exam  GENERAL:  46 y.o.-year-old patient lying in the bed with no acute distress.  EYES: Pupils equal, round, reactive to light and accommodation. No scleral icterus. Extraocular muscles intact.  HEENT: Head atraumatic, normocephalic. Oropharynx and nasopharynx clear.  NECK:  Supple, no jugular venous distention. No thyroid enlargement, no tenderness.  LUNGS: Normal breath sounds bilaterally, no wheezing, rales,rhonchi or crepitation. No use of accessory muscles of respiration.  CARDIOVASCULAR: S1, S2 normal. No murmurs, rubs, or gallops.  ABDOMEN: Soft, nontender, nondistended. Bowel sounds present. No organomegaly or mass.  EXTREMITIES: No pedal edema, cyanosis, or clubbing. left foot palm ulcer 2 cm in diameter, stage IV with discharge. NEUROLOGIC: Cranial nerves II through XII are intact. Muscle strength 5/5 in all extremities. Sensation intact. Gait not checked.  PSYCHIATRIC: The patient is alert and oriented x 3.  SKIN: No obvious rash, lesion, or ulcer.   LABORATORY PANEL:   CBC  Recent Labs Lab 01/31/17 1600  WBC 15.2*  HGB 11.5*  HCT 34.2*  PLT 237   ------------------------------------------------------------------------------------------------------------------  Chemistries   Recent Labs Lab 01/31/17 1600  NA 132*  K 4.0  CL 94*  CO2 28  GLUCOSE 182*  BUN 37*  CREATININE 9.34*  CALCIUM 8.7*  AST 11*  ALT 10*  ALKPHOS 68  BILITOT 0.7   ------------------------------------------------------------------------------------------------------------------  Cardiac Enzymes  Recent Labs Lab 01/31/17 1600  TROPONINI 0.03*    ------------------------------------------------------------------------------------------------------------------  RADIOLOGY:  Dg Chest Portable 1 View  Result Date: 01/31/2017 CLINICAL DATA:  Code sepsis EXAM: PORTABLE CHEST 1 VIEW COMPARISON:  None. FINDINGS: Top-normal heart size. Normal mediastinal contour. No pneumothorax. No pleural effusion. Lungs appear clear, with no acute consolidative airspace disease and no pulmonary edema. IMPRESSION: No active disease. Electronically Signed   By: Ilona Sorrel M.D.   On: 01/31/2017 16:08   Dg Foot Complete Left  Result Date: 01/31/2017 CLINICAL DATA:  Ulcer overlying the distal aspect of the fifth metatarsal bone. Rule out osteomyelitis. EXAM: LEFT FOOT - COMPLETE 3+ VIEW COMPARISON:  None. FINDINGS: There is a large also or adjacent to the undersurface of the distal aspect of the fifth metatarsal bone and fifth MTP joints. There is no focal areas of bony destruction. No fractures or dislocation. IMPRESSION: 1. Large ulcer overlying the distal aspect of the fifth metatarsal. No underlying bony destruction identified. Electronically Signed   By: Kerby Moors M.D.   On: 01/31/2017 16:09      IMPRESSION AND PLAN:   Sepsis due to left foot ulcer infection. The patient will be admitted to medical floor. Continue Zosyn and vancomycin, follow-up CBC and cultures, follow-up infectious disease consult and podiatry consult.  ESRD on  hemodialysis. Follow-up nephrology for hemodialysis.  Diabetes. Start sliding scale and check hemoglobin A1c.  Hypertension. Continue hypertension medication.  HIV. Continue HIV medications and ID consult.  All the records are reviewed and case discussed with ED provider. Management plans discussed with the patient, family and they are in agreement.  CODE STATUS: Full code  TOTAL TIME TAKING CARE OF THIS PATIENT: 56 minutes.    Nathan Ayers M.D on 01/31/2017 at 5:47 PM  Between 7am to 6pm - Pager -  [phone]  After 6pm go to [email or web address] - [facility]  Office  [phone]  CC: Primary care physician; No PCP Per Patient   Note: This dictation was prepared with Dragon dictation along with smaller phrase technology. Any transcriptional errors that result from this process are unintentional.

## 2017-02-01 ENCOUNTER — Inpatient Hospital Stay: Payer: Medicaid Other

## 2017-02-01 DIAGNOSIS — E440 Moderate protein-calorie malnutrition: Secondary | ICD-10-CM | POA: Insufficient documentation

## 2017-02-01 LAB — HEMOGLOBIN A1C
Hgb A1c MFr Bld: 6.1 % — ABNORMAL HIGH (ref 4.8–5.6)
Mean Plasma Glucose: 128 mg/dL

## 2017-02-01 LAB — CBC
HEMATOCRIT: 28.6 % — AB (ref 40.0–52.0)
Hemoglobin: 9.5 g/dL — ABNORMAL LOW (ref 13.0–18.0)
MCH: 29.5 pg (ref 26.0–34.0)
MCHC: 33.4 g/dL (ref 32.0–36.0)
MCV: 88.5 fL (ref 80.0–100.0)
PLATELETS: 212 10*3/uL (ref 150–440)
RBC: 3.23 MIL/uL — AB (ref 4.40–5.90)
RDW: 17.7 % — AB (ref 11.5–14.5)
WBC: 20.1 10*3/uL — ABNORMAL HIGH (ref 3.8–10.6)

## 2017-02-01 LAB — GLUCOSE, CAPILLARY
GLUCOSE-CAPILLARY: 97 mg/dL (ref 65–99)
GLUCOSE-CAPILLARY: 156 mg/dL — AB (ref 65–99)
GLUCOSE-CAPILLARY: 167 mg/dL — AB (ref 65–99)
Glucose-Capillary: 137 mg/dL — ABNORMAL HIGH (ref 65–99)

## 2017-02-01 LAB — BASIC METABOLIC PANEL
Anion gap: 10 (ref 5–15)
BUN: 46 mg/dL — ABNORMAL HIGH (ref 6–20)
CHLORIDE: 95 mmol/L — AB (ref 101–111)
CO2: 28 mmol/L (ref 22–32)
CREATININE: 11.49 mg/dL — AB (ref 0.61–1.24)
Calcium: 8 mg/dL — ABNORMAL LOW (ref 8.9–10.3)
GFR calc Af Amer: 5 mL/min — ABNORMAL LOW (ref >60)
GFR calc non Af Amer: 5 mL/min — ABNORMAL LOW (ref >60)
Glucose, Bld: 95 mg/dL (ref 65–99)
Potassium: 4.3 mmol/L (ref 3.5–5.1)
Sodium: 133 mmol/L — ABNORMAL LOW (ref 135–145)

## 2017-02-01 LAB — MRSA PCR SCREENING: MRSA by PCR: NEGATIVE

## 2017-02-01 MED ORDER — ALTEPLASE 2 MG IJ SOLR: 2.0000 mg | Freq: Once | INTRAMUSCULAR | Status: DC | PRN

## 2017-02-01 MED ORDER — NEPRO/CARBSTEADY PO LIQD: 237.0000 mL | ORAL | Status: DC | PRN

## 2017-02-01 MED ORDER — PRO-STAT SUGAR FREE PO LIQD
30.0000 mL | Freq: Two times a day (BID) | ORAL | Status: DC
  Administered 2017-02-01 – 2017-02-03 (×5): 30 mL via ORAL

## 2017-02-01 MED ORDER — HEPARIN SODIUM (PORCINE) 1000 UNIT/ML DIALYSIS
1000.0000 [IU] | INTRAMUSCULAR | Status: DC | PRN
  Filled 2017-02-01: qty 1

## 2017-02-01 MED ORDER — LIDOCAINE HCL (PF) 1 % IJ SOLN
5.0000 mL | INTRAMUSCULAR | Status: DC | PRN
  Filled 2017-02-01: qty 5

## 2017-02-01 MED ORDER — SODIUM CHLORIDE 0.9 % IV SOLN: 100.0000 mL | INTRAVENOUS | Status: DC | PRN

## 2017-02-01 MED ORDER — LIDOCAINE-PRILOCAINE 2.5-2.5 % EX CREA
1.0000 "application " | TOPICAL_CREAM | CUTANEOUS | Status: DC | PRN
  Filled 2017-02-01: qty 5

## 2017-02-01 NOTE — Consult Note (Signed)
ORTHOPAEDIC CONSULTATION  REQUESTING PHYSICIAN: Henreitta Leber, MD  Chief Complaint: Left foot infection  HPI: Nathan Ayers is a 46 y.o. male who complains of  left foot infection. Patient has a history of diabetes with end-stage renal disease on dialysis and HIV. He has noted neuropathy to the lower extremities. Has not seen a foot doctor in some time. Has had a chronic ulcer on his left foot. Has not seen anyone about this but noticed worsening drainage and foul odor over the last several days. Admitted to the hospital with code sepsis. Elevated white blood cell count of 20,000.  Past Medical History:  Diagnosis Date  . Chronic kidney disease on chronic dialysis ([place])   . Diabetes mellitus ([place])   . Diabetic neuropathy ([place])   . HIV infection ([place])   . Hypertension    History reviewed. No pertinent surgical history. Social History   Social History  . Marital status: Single    Spouse name: N/A  . Number of children: N/A  . Years of education: N/A   Social History Main Topics  . Smoking status: Never Smoker  . Smokeless tobacco: Never Used  . Alcohol use No  . Drug use: No  . Sexual activity: Not Currently    Partners: Male    Birth control/ protection: Condom   Other Topics Concern  . None   Social History Narrative  . None   Family History  Problem Relation Age of Onset  . Diabetes Mother   . Hypertension Mother    No Known Allergies Prior to Admission medications   Medication Sig Start Date End Date Taking? Authorizing Provider  abacavir (ZIAGEN) 300 MG tablet Take 1 tablet by mouth 2 (two) times daily. 11/12/16  Yes Historical Provider, MD  calcium acetate (PHOSLO) 667 MG capsule Take 1 capsule by mouth 3 (three) times daily. 11/06/16  Yes Historical Provider, MD  carvedilol (COREG) 25 MG tablet Take 1 tablet by mouth every 12 (twelve) hours. 01/02/17  Yes Historical Provider, MD  insulin aspart (NOVOLOG) 100 UNIT/ML injection Inject into the skin 3 (three)  times daily with meals.   Yes Historical Provider, MD  lamiVUDine (EPIVIR) 10 MG/ML solution Take 2.5 mLs by mouth daily. Take 2.25m daily, after dialysis and on days of dialysis 01/02/17  Yes Historical Provider, MD  losartan (COZAAR) 100 MG tablet Take 100 mg by mouth daily. 06/28/16 06/28/17 Yes Historical Provider, MD  minoxidil (LONITEN) 2.5 MG tablet Take 2.5 mg by mouth 2 (two) times daily. 01/23/17  Yes Historical Provider, MD  ondansetron (ZOFRAN-ODT) 4 MG disintegrating tablet Take 1 tablet by mouth every 8 (eight) hours as needed. 02/24/16  Yes Historical Provider, MD  TIVICAY 50 MG tablet Take 1 tablet by mouth 2 (two) times daily. 01/02/17  Yes Historical Provider, MD  aspirin 81 MG chewable tablet Chew 81 mg by mouth daily.    Historical Provider, MD  hydrALAZINE (APRESOLINE) 25 MG tablet Take 1 tablet by mouth every 8 (eight) hours. 02/24/16 02/23/17  Historical Provider, MD   Dg Chest Portable 1 View  Result Date: 01/31/2017 CLINICAL DATA:  Code sepsis EXAM: PORTABLE CHEST 1 VIEW COMPARISON:  None. FINDINGS: Top-normal heart size. Normal mediastinal contour. No pneumothorax. No pleural effusion. Lungs appear clear, with no acute consolidative airspace disease and no pulmonary edema. IMPRESSION: No active disease. Electronically Signed   By: JIlona SorrelM.D.   On: 01/31/2017 16:08   Dg Foot Complete Left  Result Date: 01/31/2017 CLINICAL DATA:  Ulcer overlying the distal aspect of the fifth metatarsal bone. Rule out osteomyelitis. EXAM: LEFT FOOT - COMPLETE 3+ VIEW COMPARISON:  None. FINDINGS: There is a large also or adjacent to the undersurface of the distal aspect of the fifth metatarsal bone and fifth MTP joints. There is no focal areas of bony destruction. No fractures or dislocation. IMPRESSION: 1. Large ulcer overlying the distal aspect of the fifth metatarsal. No underlying bony destruction identified. Electronically Signed   By: Kerby Moors M.D.   On: 01/31/2017 16:09     Positive ROS: All other systems have been reviewed and were otherwise negative with the exception of those mentioned in the HPI and as above.  12 point ROS was performed.  Physical Exam: General: Alert and oriented.  No apparent distress.  Vascular:  Left foot:Dorsalis Pedis:  present Posterior Tibial:  present  Right foot: Dorsalis Pedis:  present Posterior Tibial:  present  Neuro:absent objective sensation  Derm: Right foot without ulceration  Left foot with full-thickness necrotic tissue centimeter ulceration under the left fifth MTPJ. This probes directly down to bone. Foul odor and purulent drainage noted.  Ortho/MS: No pain with range of motion. Edema to the left foot is noted.  X-rays: Left foot x-ray does not show obvious erosive changes. Some osteopenia at the ulcerative site at the left fifth metatarsal head.   Assessment: Diabetic neuropathic ulceration left fifth metatarsal head probes directly to bone with necrosis of muscle and soft tissue.  Plan: Deep wound culture was performed. This does probe directly down to bone and I was worried osteomyelitis. The very necrotic foul odor to the area. We'll order an MRI to evaluate for osteomyelitis. We discussed the possibility of fifth metatarsal head resection and/or amputation fifth toe based on MRI findings. I'll follow-up after the MRI has been performed. Dressing applied today    Elesa Hacker, DPM Cell [phone]   02/01/2017 1:03 PM

## 2017-02-01 NOTE — Plan of Care (Signed)
Problem: Pain Managment: Goal: General experience of comfort will improve Outcome: Progressing Patient has had no complaints of pain.

## 2017-02-01 NOTE — Consult Note (Signed)
[facility] Infectious Disease     Reason for Consult: DM foot infection, HIV    Referring Physician:  Estanislado Spire Date of Admission:  01/31/2017   Active Problems:   Sepsis ([place])   Malnutrition of moderate degree   HPI: Nathan Ayers is a 46 y.o. male with well controlled HIV, ESRD, DM with PN and HTN admitted with fevers, chills and NV. He had a large L foot wound progressive over last several months. He has not sought care for it. He has not been on otpt abx. Said had similar issue a year ago. He reports compliance with his HIV meds and last  Cd4 435, VL 01 Jan 2017. Follows with Dr Russ Halo at [facility].   Past Medical History:  Diagnosis Date  . Chronic kidney disease on chronic dialysis ([place])   . Diabetes mellitus ([place])   . Diabetic neuropathy ([place])   . HIV infection ([place])   . Hypertension    History reviewed. No pertinent surgical history. Social History  Substance Use Topics  . Smoking status: Never Smoker  . Smokeless tobacco: Never Used  . Alcohol use No   Family History  Problem Relation Age of Onset  . Diabetes Mother   . Hypertension Mother     Allergies: No Known Allergies  Current antibiotics: Antibiotics Given (last 72 hours)    Date/Time Action Medication Dose Rate   01/31/17 2118 Given   abacavir (ZIAGEN) tablet 300 mg 300 mg    01/31/17 2118 Given   dolutegravir (TIVICAY) tablet 50 mg 50 mg    01/31/17 2118 Given   vancomycin (VANCOCIN) IVPB 1000 mg/200 mL premix 1,000 mg 200 mL/hr   02/01/17 2725 Given   piperacillin-tazobactam (ZOSYN) IVPB 3.375 g 3.375 g 12.5 mL/hr   02/01/17 3664 Given  [per patient preference]   abacavir (ZIAGEN) tablet 300 mg 300 mg    02/01/17 4034 Given  [per patient preference]   dolutegravir (TIVICAY) tablet 50 mg 50 mg       MEDICATIONS: . abacavir  300 mg Oral BID  . aspirin  81 mg Oral Daily  . calcium acetate  667 mg Oral TID WC  . carvedilol  25 mg Oral BID WC  . dolutegravir  50 mg Oral BID  . feeding  supplement (PRO-STAT SUGAR FREE 64)  30 mL Oral BID  . heparin  5,000 Units Subcutaneous Q8H  . insulin aspart  0-5 Units Subcutaneous QHS  . insulin aspart  0-9 Units Subcutaneous TID WC  . lamiVUDine  25 mg Oral Daily  . losartan  100 mg Oral Daily  . minoxidil  2.5 mg Oral BID  . piperacillin-tazobactam (ZOSYN)  IV  3.375 g Intravenous Q12H  . sodium chloride flush  3 mL Intravenous Q12H    Review of Systems - 11 systems reviewed and negative per HPI   OBJECTIVE: Temp:  [98.3 F (36.8 C)-102.1 F (38.9 C)] 98.5 F (36.9 C) (03/27 1248) Pulse Rate:  [83-105] 83 (03/27 1248) Resp:  [14-23] 16 (03/27 1248) BP: (98-157)/(52-77) 116/53 (03/27 1248) SpO2:  [95 %-100 %] 99 % (03/27 1248) Weight:  [99.7 kg (219 lb 14.4 oz)] 99.7 kg (219 lb 14.4 oz) (03/26 1919) Physical Exam  Constitutional: He is oriented to person, place, and time. He appears well-developed and well-nourished. No distress.  HENT: anicteric  Mouth/Throat: Oropharynx is clear and moist. No oropharyngeal exudate.  Cardiovascular: Normal rate, regular rhythm and normal heart sounds.  Pulmonary/Chest: Effort normal and breath sounds normal. No  respiratory distress. He has no wheezes.  Abdominal: Soft. Bowel sounds are normal. He exhibits no distension. There is no tenderness.  Lymphadenopathy: He has no cervical adenopathy.  Neurological: He is alert and oriented to person, place, and time.  Skin: L foot with large 2-3 cm circular ulcer on plantar surface laterally with necrotic tissue, odor, drainage. Probes to bone.  Psychiatric: He has a flat  affect. His behavior is normal.     LABS: Results for orders placed or performed during the hospital encounter of 01/31/17 (from the past 48 hour(s))  Culture, blood (Routine x 2)     Status: None (Preliminary result)   Collection Time: 01/31/17  3:32 PM  Result Value Ref Range   Specimen Description BLOOD LEFT ANTECUBITAL    Special Requests      BOTTLES DRAWN AEROBIC  AND ANAEROBIC Blood Culture adequate volume   Culture NO GROWTH < 24 HOURS    Report Status PENDING   Culture, blood (Routine x 2)     Status: None (Preliminary result)   Collection Time: 01/31/17  3:32 PM  Result Value Ref Range   Specimen Description BLOOD LEFT FOREARM    Special Requests      BOTTLES DRAWN AEROBIC AND ANAEROBIC Blood Culture adequate volume   Culture NO GROWTH < 24 HOURS    Report Status PENDING   Hemoglobin A1c     Status: Abnormal   Collection Time: 01/31/17  3:32 PM  Result Value Ref Range   Hgb A1c MFr Bld 6.1 (H) 4.8 - 5.6 %    Comment: (NOTE)         Pre-diabetes: 5.7 - 6.4         Diabetes: >6.4         Glycemic control for adults with diabetes: <7.0    Mean Plasma Glucose 128 mg/dL    Comment: (NOTE) Performed At: [facility] [address] [place], [place] [id number] Lindon Romp MD OT:[id number]   Lipase, blood     Status: None   Collection Time: 01/31/17  4:00 PM  Result Value Ref Range   Lipase 11 11 - 51 U/L  Procalcitonin     Status: None   Collection Time: 01/31/17  4:00 PM  Result Value Ref Range   Procalcitonin 127.27 ng/mL    Comment:        Interpretation: PCT >= 10 ng/mL: Important systemic inflammatory response, almost exclusively due to severe bacterial sepsis or septic shock. (NOTE)         ICU PCT Algorithm               Non ICU PCT Algorithm    ----------------------------     ------------------------------         PCT < 0.25 ng/mL                 PCT < 0.1 ng/mL     Stopping of antibiotics            Stopping of antibiotics       strongly encouraged.               strongly encouraged.    ----------------------------     ------------------------------       PCT level decrease by               PCT < 0.25 ng/mL       >= 80% from peak PCT       OR PCT 0.25 - 0.5  ng/mL          Stopping of antibiotics                                             encouraged.     Stopping of antibiotics           encouraged.     ----------------------------     ------------------------------       PCT level decrease by              PCT >= 0.25 ng/mL       < 80% from peak PCT        AND PCT >= 0.5 ng/mL             Continuing antibiotics                                              encouraged.       Continuing antibiotics            encouraged.    ----------------------------     ------------------------------     PCT level increase compared          PCT > 0.5 ng/mL         with peak PCT AND          PCT >= 0.5 ng/mL             Escalation of antibiotics                                          strongly encouraged.      Escalation of antibiotics        strongly encouraged.   Protime-INR     Status: Abnormal   Collection Time: 01/31/17  4:00 PM  Result Value Ref Range   Prothrombin Time 15.6 (H) 11.4 - 15.2 seconds   INR 1.23   Troponin I     Status: Abnormal   Collection Time: 01/31/17  4:00 PM  Result Value Ref Range   Troponin I 0.03 (HH) <0.03 ng/mL    Comment: CRITICAL RESULT CALLED TO, READ BACK BY AND VERIFIED WITH LAURA CATES AT 1709 01/31/2017 BY TFK.   Comprehensive metabolic panel     Status: Abnormal   Collection Time: 01/31/17  4:00 PM  Result Value Ref Range   Sodium 132 (L) 135 - 145 mmol/L   Potassium 4.0 3.5 - 5.1 mmol/L   Chloride 94 (L) 101 - 111 mmol/L   CO2 28 22 - 32 mmol/L   Glucose, Bld 182 (H) 65 - 99 mg/dL   BUN 37 (H) 6 - 20 mg/dL   Creatinine, Ser 9.34 (H) 0.61 - 1.24 mg/dL   Calcium 8.7 (L) 8.9 - 10.3 mg/dL   Total Protein 8.9 (H) 6.5 - 8.1 g/dL   Albumin 3.3 (L) 3.5 - 5.0 g/dL   AST 11 (L) 15 - 41 U/L   ALT 10 (L) 17 - 63 U/L   Alkaline Phosphatase 68 38 - 126 U/L   Total Bilirubin 0.7 0.3 - 1.2 mg/dL   GFR calc non Af Amer 6 (L) >60 mL/min   GFR calc  Af Amer 7 (L) >60 mL/min    Comment: (NOTE) The eGFR has been calculated using the CKD EPI equation. This calculation has not been validated in all clinical situations. eGFR's persistently <60 mL/min signify possible  Chronic Kidney Disease.    Anion gap 10 5 - 15  CBC with Differential     Status: Abnormal   Collection Time: 01/31/17  4:00 PM  Result Value Ref Range   WBC 15.2 (H) 3.8 - 10.6 K/uL   RBC 3.86 (L) 4.40 - 5.90 MIL/uL   Hemoglobin 11.5 (L) 13.0 - 18.0 g/dL   HCT 34.2 (L) 40.0 - 52.0 %   MCV 88.7 80.0 - 100.0 fL   MCH 29.8 26.0 - 34.0 pg   MCHC 33.6 32.0 - 36.0 g/dL   RDW 17.5 (H) 11.5 - 14.5 %   Platelets 237 150 - 440 K/uL   Neutrophils Relative % 88 %   Neutro Abs 13.3 (H) 1.4 - 6.5 K/uL   Lymphocytes Relative 6 %   Lymphs Abs 1.0 1.0 - 3.6 K/uL   Monocytes Relative 6 %   Monocytes Absolute 0.9 0.2 - 1.0 K/uL   Eosinophils Relative 0 %   Eosinophils Absolute 0.0 0 - 0.7 K/uL   Basophils Relative 0 %   Basophils Absolute 0.0 0 - 0.1 K/uL  Lactic acid, plasma     Status: None   Collection Time: 01/31/17  4:00 PM  Result Value Ref Range   Lactic Acid, Venous 1.6 0.5 - 1.9 mmol/L  Aerobic/Anaerobic Culture (surgical/deep wound)     Status: None (Preliminary result)   Collection Time: 01/31/17  4:00 PM  Result Value Ref Range   Specimen Description ABSCESS    Special Requests Immunocompromised    Gram Stain      MODERATE WBC PRESENT,BOTH PMN AND MONONUCLEAR ABUNDANT GRAM NEGATIVE RODS ABUNDANT GRAM POSITIVE COCCI IN PAIRS RARE GRAM POSITIVE RODS    Culture      TOO YOUNG TO READ Performed at [facility], [address], [place], [place] [address]    Report Status PENDING   Glucose, capillary     Status: Abnormal   Collection Time: 01/31/17  9:39 PM  Result Value Ref Range   Glucose-Capillary 203 (H) 65 - 99 mg/dL  Basic metabolic panel     Status: Abnormal   Collection Time: 02/01/17  6:15 AM  Result Value Ref Range   Sodium 133 (L) 135 - 145 mmol/L   Potassium 4.3 3.5 - 5.1 mmol/L   Chloride 95 (L) 101 - 111 mmol/L   CO2 28 22 - 32 mmol/L   Glucose, Bld 95 65 - 99 mg/dL   BUN 46 (H) 6 - 20 mg/dL   Creatinine, Ser 11.49 (H) 0.61 - 1.24 mg/dL   Calcium  8.0 (L) 8.9 - 10.3 mg/dL   GFR calc non Af Amer 5 (L) >60 mL/min   GFR calc Af Amer 5 (L) >60 mL/min    Comment: (NOTE) The eGFR has been calculated using the CKD EPI equation. This calculation has not been validated in all clinical situations. eGFR's persistently <60 mL/min signify possible Chronic Kidney Disease.    Anion gap 10 5 - 15  CBC     Status: Abnormal   Collection Time: 02/01/17  6:15 AM  Result Value Ref Range   WBC 20.1 (H) 3.8 - 10.6 K/uL   RBC 3.23 (L) 4.40 - 5.90 MIL/uL   Hemoglobin 9.5 (L) 13.0 - 18.0 g/dL  HCT 28.6 (L) 40.0 - 52.0 %   MCV 88.5 80.0 - 100.0 fL   MCH 29.5 26.0 - 34.0 pg   MCHC 33.4 32.0 - 36.0 g/dL   RDW 17.7 (H) 11.5 - 14.5 %   Platelets 212 150 - 440 K/uL  Glucose, capillary     Status: None   Collection Time: 02/01/17  7:45 AM  Result Value Ref Range   Glucose-Capillary 97 65 - 99 mg/dL   Comment 1 Notify RN   MRSA PCR Screening     Status: None   Collection Time: 02/01/17  8:26 AM  Result Value Ref Range   MRSA by PCR NEGATIVE NEGATIVE    Comment:        The GeneXpert MRSA Assay (FDA approved for NASAL specimens only), is one component of a comprehensive MRSA colonization surveillance program. It is not intended to diagnose MRSA infection nor to guide or monitor treatment for MRSA infections.   Glucose, capillary     Status: Abnormal   Collection Time: 02/01/17 11:51 AM  Result Value Ref Range   Glucose-Capillary 137 (H) 65 - 99 mg/dL   Comment 1 Notify RN    No components found for: ESR, C REACTIVE PROTEIN MICRO: Recent Results (from the past 720 hour(s))  Culture, blood (Routine x 2)     Status: None (Preliminary result)   Collection Time: 01/31/17  3:32 PM  Result Value Ref Range Status   Specimen Description BLOOD LEFT ANTECUBITAL  Final   Special Requests   Final    BOTTLES DRAWN AEROBIC AND ANAEROBIC Blood Culture adequate volume   Culture NO GROWTH < 24 HOURS  Final   Report Status PENDING  Incomplete  Culture,  blood (Routine x 2)     Status: None (Preliminary result)   Collection Time: 01/31/17  3:32 PM  Result Value Ref Range Status   Specimen Description BLOOD LEFT FOREARM  Final   Special Requests   Final    BOTTLES DRAWN AEROBIC AND ANAEROBIC Blood Culture adequate volume   Culture NO GROWTH < 24 HOURS  Final   Report Status PENDING  Incomplete  Aerobic/Anaerobic Culture (surgical/deep wound)     Status: None (Preliminary result)   Collection Time: 01/31/17  4:00 PM  Result Value Ref Range Status   Specimen Description ABSCESS  Final   Special Requests Immunocompromised  Final   Gram Stain   Final    MODERATE WBC PRESENT,BOTH PMN AND MONONUCLEAR ABUNDANT GRAM NEGATIVE RODS ABUNDANT GRAM POSITIVE COCCI IN PAIRS RARE GRAM POSITIVE RODS    Culture   Final    TOO YOUNG TO READ Performed at [facility], [place] [address], [place], [place] [address]    Report Status PENDING  Incomplete  MRSA PCR Screening     Status: None   Collection Time: 02/01/17  8:26 AM  Result Value Ref Range Status   MRSA by PCR NEGATIVE NEGATIVE Final    Comment:        The GeneXpert MRSA Assay (FDA approved for NASAL specimens only), is one component of a comprehensive MRSA colonization surveillance program. It is not intended to diagnose MRSA infection nor to guide or monitor treatment for MRSA infections.     IMAGING: Mr Foot Left Wo Contrast  Result Date: 02/01/2017 CLINICAL DATA:  Pt has a large wound on his left foot that is apparently been present and gradually worsening for at least weeks if not months. EXAM: MRI OF THE LEFT FOOT WITHOUT  CONTRAST TECHNIQUE: Multiplanar, multisequence MR imaging of the left forefoot was performed. No intravenous contrast was administered. COMPARISON:  None. FINDINGS: Bones/Joint/Cartilage Large soft tissue ulcer overlying the fifth MTP joint. Cortical destruction of the fifth metatarsal head with severe marrow edema most consistent with osteomyelitis. Cortical  irregularity of the fifth proximal phalanx with severe edema concerning for osteomyelitis. No other marrow signal abnormality. No fracture or dislocation. Normal alignment. No joint effusion. Ligaments Collateral ligaments are intact.  Lisfranc ligament is intact. Muscles and Tendons Flexor, peroneal and extensor compartment tendons are intact. Soft tissue No fluid collection or hematoma.  No soft tissue mass. IMPRESSION: 1. Large soft tissue ulcer overlying the fifth MTP joint. Findings concerning for osteomyelitis of the fifth metatarsal head and possibly base of the fifth proximal phalanx. Electronically Signed   By: Kathreen Devoid   On: 02/01/2017 14:46   Dg Chest Portable 1 View  Result Date: 01/31/2017 CLINICAL DATA:  Code sepsis EXAM: PORTABLE CHEST 1 VIEW COMPARISON:  None. FINDINGS: Top-normal heart size. Normal mediastinal contour. No pneumothorax. No pleural effusion. Lungs appear clear, with no acute consolidative airspace disease and no pulmonary edema. IMPRESSION: No active disease. Electronically Signed   By: Ilona Sorrel M.D.   On: 01/31/2017 16:08   Dg Foot Complete Left  Result Date: 01/31/2017 CLINICAL DATA:  Ulcer overlying the distal aspect of the fifth metatarsal bone. Rule out osteomyelitis. EXAM: LEFT FOOT - COMPLETE 3+ VIEW COMPARISON:  None. FINDINGS: There is a large also or adjacent to the undersurface of the distal aspect of the fifth metatarsal bone and fifth MTP joints. There is no focal areas of bony destruction. No fractures or dislocation. IMPRESSION: 1. Large ulcer overlying the distal aspect of the fifth metatarsal. No underlying bony destruction identified. Electronically Signed   By: Kerby Moors M.D.   On: 01/31/2017 16:09    Assessment:   Nathan Ayers is a 46 y.o. male with well controlled HIV, DM, PN, ESRD on HD admitted with L foot severe ulcer and underlying osteomyelitis of 5th MT head. Cultures pending  Recommendations 1. DM foot infection with  osteomyelitis- podiatry will likely operate. cxs pending Continue vanco and zosyn Will need prolonged IV abx but can be given at HD  2. HIV- well controlled. Cont current meds  Thank you very much for allowing me to participate in the care of this patient. Please call with questions.   Cheral Marker. Ola Spurr, MD

## 2017-02-01 NOTE — Progress Notes (Signed)
[place] [facility] [place] NAME: Nathan Ayers    MR#:  [id number]  DATE OF BIRTH:  Aug 24, 1971  SUBJECTIVE:   Patient here due to sepsis secondary to a left diabetic foot ulcer. Remains afebrile today, hemodynamically stable. Still having significant pain and foul-smelling drainage from the ulcer on the left foot.  REVIEW OF SYSTEMS:    Review of Systems  Constitutional: Negative for chills and fever.  HENT: Negative for congestion and tinnitus.   Eyes: Negative for blurred vision and double vision.  Respiratory: Negative for cough, shortness of breath and wheezing.   Cardiovascular: Negative for chest pain, orthopnea and PND.  Gastrointestinal: Negative for abdominal pain, diarrhea, nausea and vomiting.  Genitourinary: Negative for dysuria and hematuria.  Neurological: Positive for weakness. Negative for dizziness, sensory change and focal weakness.  All other systems reviewed and are negative.   Nutrition: Renal/Carb diet Tolerating Diet: Yes Tolerating PT: Await Eval   DRUG ALLERGIES:  No Known Allergies  VITALS:  Blood pressure (!) 116/53, pulse 83, temperature 98.5 F (36.9 C), temperature source Oral, resp. rate 16, height _0  (1.981 m), weight 99.7 kg (219 lb 14.4 oz), SpO2 99 %.  PHYSICAL EXAMINATION:   Physical Exam  GENERAL:  46 y.o.-year-old patient lying in bed in no acute distress.  EYES: Pupils equal, round, reactive to light and accommodation. No scleral icterus. Extraocular muscles intact.  HEENT: Head atraumatic, normocephalic. Oropharynx and nasopharynx clear.  NECK:  Supple, no jugular venous distention. No thyroid enlargement, no tenderness.  LUNGS: Normal breath sounds bilaterally, no wheezing, rales, rhonchi. No use of accessory muscles of respiration.  CARDIOVASCULAR: S1, S2 normal. No murmurs, rubs, or gallops.  ABDOMEN: Soft, nontender, nondistended. Bowel sounds present. No organomegaly or mass.   EXTREMITIES: No cyanosis, clubbing or edema b/l.    NEUROLOGIC: Cranial nerves II through XII are intact. No focal Motor or sensory deficits b/l.   PSYCHIATRIC: The patient is alert and oriented x 3.  SKIN: No obvious rash, lesion, left diabetic foot ulcer w/ foul smelling drainage.    LABORATORY PANEL:   CBC  Recent Labs Lab 02/01/17 0615  WBC 20.1*  HGB 9.5*  HCT 28.6*  PLT 212   ------------------------------------------------------------------------------------------------------------------  Chemistries   Recent Labs Lab 01/31/17 1600 02/01/17 0615  NA 132* 133*  K 4.0 4.3  CL 94* 95*  CO2 28 28  GLUCOSE 182* 95  BUN 37* 46*  CREATININE 9.34* 11.49*  CALCIUM 8.7* 8.0*  AST 11*  --   ALT 10*  --   ALKPHOS 68  --   BILITOT 0.7  --    ------------------------------------------------------------------------------------------------------------------  Cardiac Enzymes  Recent Labs Lab 01/31/17 1600  TROPONINI 0.03*   ------------------------------------------------------------------------------------------------------------------  RADIOLOGY:  Dg Chest Portable 1 View  Result Date: 01/31/2017 CLINICAL DATA:  Code sepsis EXAM: PORTABLE CHEST 1 VIEW COMPARISON:  None. FINDINGS: Top-normal heart size. Normal mediastinal contour. No pneumothorax. No pleural effusion. Lungs appear clear, with no acute consolidative airspace disease and no pulmonary edema. IMPRESSION: No active disease. Electronically Signed   By: Nathan Ayers M.D.   On: 01/31/2017 16:08   Dg Foot Complete Left  Result Date: 01/31/2017 CLINICAL DATA:  Ulcer overlying the distal aspect of the fifth metatarsal bone. Rule out osteomyelitis. EXAM: LEFT FOOT - COMPLETE 3+ VIEW COMPARISON:  None. FINDINGS: There is a large also or adjacent to the undersurface of the distal aspect of the fifth metatarsal bone and fifth MTP joints. There  is no focal areas of bony destruction. No fractures or dislocation.  IMPRESSION: 1. Large ulcer overlying the distal aspect of the fifth metatarsal. No underlying bony destruction identified. Electronically Signed   By: Nathan Ayers M.D.   On: 01/31/2017 16:09     ASSESSMENT AND PLAN:   46 year old history of HIV, hypertension, diabetes, end-stage renal disease hemodialysis, diabetic neuropathy who presented to the hospital due to fever and a left foot ulcer with foul-smelling drainage.  1. Sepsis-patient met criteria on admission given leukocytosis, fever -Continue broad-spectrum IV antibiotics with vancomycin, Zosyn. -Follow cultures, currently afebrile.  2. Left foot ulcer with foul-smelling drainage-dose of patient's sepsis. -Continue IV vancomycin, Zosyn. Appreciate podiatry input await MRI results and likely needs surgical intervention this Friday.  3. Stage renal disease on hemodialysis-we'll consult nephrology, continue dialysis on Monday Wednesday Friday.  4. Hx of HIV - cont. HAART.   - no acute issue. Await ID consult.   5. DM - cont. SSI.   - BS stable.   6. Imdur hyperparathyroidism-continue PhosLo.  7. Essential hypertension-continue losartan, minoxidil, Coreg.   All the records are reviewed and case discussed with Care Management/Social Worker. Management plans discussed with the patient, family and they are in agreement.  CODE STATUS: Full Code  DVT Prophylaxis: Hep. SQ  TOTAL TIME TAKING CARE OF THIS PATIENT: 30 minutes.   POSSIBLE D/C IN 2-3 DAYS, DEPENDING ON CLINICAL CONDITION.   Nathan Ayers M.D on 02/01/2017 at 2:06 PM  Between 7am to 6pm - Pager - [phone]  After 6pm go to [email or web address] - Technical brewer [facility]  Office  [phone]  CC: Primary care physician; No PCP Per Patient

## 2017-02-01 NOTE — Consult Note (Signed)
Date: 02/01/2017                  Patient Name:  Nathan Ayers  MRN: [id number]  DOB: 1971-08-05  Age / Sex: 46 y.o., male         PCP: No PCP Per Patient                 Service Requesting Consult: IM                 Reason for Consult: ESRD            History of Present Illness: Patient is a 46 y.o. male with medical problems of HIV, ESRD (Bx proven DM), diabetes with neuropathy, hypertension, who was admitted to [facility] on 01/31/2017 for evaluation of Large progressive foul smelling left foot wound.  Patient reports that he has been on hemodialysis for one year.  He dialyzes at [facility] nephrology.  History of diabetes Monday, Wednesday and Friday at 10:30 AM.  He dialyzes for 4-1/2 hours.  His binders calcium acetate He is disabled.  He used to work as a TEFL teacher Nephrology team has been consulted to arrange for dialysis while he is in the hospital   Medications: Outpatient medications: Prescriptions Prior to Admission  Medication Sig Dispense Refill Last Dose  . abacavir (ZIAGEN) 300 MG tablet Take 1 tablet by mouth 2 (two) times daily.   01/31/2017 at Unknown time  . calcium acetate (PHOSLO) 667 MG capsule Take 1 capsule by mouth 3 (three) times daily.  3 01/31/2017 at Unknown time  . carvedilol (COREG) 25 MG tablet Take 1 tablet by mouth every 12 (twelve) hours.  9 01/31/2017 at Unknown time  . insulin aspart (NOVOLOG) 100 UNIT/ML injection Inject into the skin 3 (three) times daily with meals.   01/31/2017 at Unknown time  . lamiVUDine (EPIVIR) 10 MG/ML solution Take 2.5 mLs by mouth daily. Take 2.24m daily, after dialysis and on days of dialysis  3 01/30/2017 at Unknown time  . losartan (COZAAR) 100 MG tablet Take 100 mg by mouth daily.   01/31/2017 at Unknown time  . minoxidil (LONITEN) 2.5 MG tablet Take 2.5 mg by mouth 2 (two) times daily.  3 01/31/2017 at Unknown time  . ondansetron (ZOFRAN-ODT) 4 MG disintegrating tablet Take 1 tablet by mouth every 8 (eight) hours as needed.    prn at prn  . TIVICAY 50 MG tablet Take 1 tablet by mouth 2 (two) times daily.  3 01/31/2017 at Unknown time  . aspirin 81 MG chewable tablet Chew 81 mg by mouth daily.     . hydrALAZINE (APRESOLINE) 25 MG tablet Take 1 tablet by mouth every 8 (eight) hours.   prn at prn    Current medications: Current Facility-Administered Medications  Medication Dose Route Frequency Provider Last Rate Last Dose  . 0.9 %  sodium chloride infusion  250 mL Intravenous PRN QDemetrios Loll MD      . abacavir ([facility] tablet 300 mg  300 mg Oral BID QDemetrios Loll MD   300 mg at 02/01/17 08546 . acetaminophen (TYLENOL) tablet 650 mg  650 mg Oral Q6H PRN QDemetrios Loll MD       Or  . acetaminophen (TYLENOL) suppository 650 mg  650 mg Rectal Q6H PRN QDemetrios Loll MD      . albuterol (PROVENTIL) (2.5 MG/3ML) 0.083% nebulizer solution 2.5 mg  2.5 mg Nebulization Q2H PRN QDemetrios Loll MD      . aspirin  chewable tablet 81 mg  81 mg Oral Daily Demetrios Loll, MD   81 mg at 02/01/17 0839  . calcium acetate (PHOSLO) capsule 667 mg  667 mg Oral TID WC Demetrios Loll, MD   667 mg at 02/01/17 1301  . carvedilol (COREG) tablet 25 mg  25 mg Oral BID WC Demetrios Loll, MD   25 mg at 02/01/17 0839  . dolutegravir (TIVICAY) tablet 50 mg  50 mg Oral BID Demetrios Loll, MD   50 mg at 02/01/17 0839  . feeding supplement (PRO-STAT SUGAR FREE 64) liquid 30 mL  30 mL Oral BID Henreitta Leber, MD   30 mL at 02/01/17 1300  . heparin injection 5,000 Units  5,000 Units Subcutaneous Q8H Demetrios Loll, MD   5,000 Units at 02/01/17 1301  . HYDROcodone-acetaminophen (NORCO/VICODIN) 5-325 MG per tablet 1-2 tablet  1-2 tablet Oral Q4H PRN Demetrios Loll, MD      . insulin aspart (novoLOG) injection 0-5 Units  0-5 Units Subcutaneous QHS Demetrios Loll, MD   2 Units at 01/31/17 2217  . insulin aspart (novoLOG) injection 0-9 Units  0-9 Units Subcutaneous TID WC Demetrios Loll, MD   1 Units at 02/01/17 1301  . lamiVUDine (EPIVIR) 10 MG/ML solution 25 mg  25 mg Oral Daily Demetrios Loll, MD      . losartan  (COZAAR) tablet 100 mg  100 mg Oral Daily Demetrios Loll, MD   100 mg at 02/01/17 0839  . minoxidil (LONITEN) tablet 2.5 mg  2.5 mg Oral BID Demetrios Loll, MD   2.5 mg at 02/01/17 0839  . ondansetron (ZOFRAN) tablet 4 mg  4 mg Oral Q6H PRN Demetrios Loll, MD       Or  . ondansetron [facility]) injection 4 mg  4 mg Intravenous Q6H PRN Demetrios Loll, MD      . piperacillin-tazobactam (ZOSYN) IVPB 3.375 g  3.375 g Intravenous Q12H Lenis Noon, RPH   3.375 g at 02/01/17 4431  . senna-docusate (Senokot-S) tablet 1 tablet  1 tablet Oral QHS PRN Demetrios Loll, MD      . sodium chloride flush (NS) 0.9 % injection 3 mL  3 mL Intravenous Q12H Demetrios Loll, MD   3 mL at 02/01/17 0840  . sodium chloride flush (NS) 0.9 % injection 3 mL  3 mL Intravenous PRN Demetrios Loll, MD          Allergies: No Known Allergies    Past Medical History: Past Medical History:  Diagnosis Date  . Chronic kidney disease on chronic dialysis ([place])   . Diabetes mellitus ([place])   . Diabetic neuropathy ([place])   . HIV infection ([place])   . Hypertension      Past Surgical History: History reviewed. No pertinent surgical history.   Family History: Family History  Problem Relation Age of Onset  . Diabetes Mother   . Hypertension Mother      Social History: Social History   Social History  . Marital status: Single    Spouse name: N/A  . Number of children: N/A  . Years of education: N/A   Occupational History  . Not on file.   Social History Main Topics  . Smoking status: Never Smoker  . Smokeless tobacco: Never Used  . Alcohol use No  . Drug use: No  . Sexual activity: Not Currently    Partners: Male    Birth control/ protection: Condom   Other Topics Concern  . Not on file   Social History Narrative  .  No narrative on file     Review of Systems: Gen: fever, chills HEENT: no vision changes, hearing problems or sore throat CV: no chest pain or shortness of breath Resp: no cough or sputum GI: appetite is fair GU :denies  any history of kidney stones.  No dysuria  MS: left foot wound ulcer.  No other complaints Derm:  no complaints Psych:no complaints Heme: no complaints Neuro: no complaints Endocrine.  No complaints  Vital Signs: Blood pressure (!) 116/53, pulse 83, temperature 98.5 F (36.9 C), temperature source Oral, resp. rate 16, height 6' 6"  (1.981 m), weight 99.7 kg (219 lb 14.4 oz), SpO2 99 %.   Intake/Output Summary (Last 24 hours) at 02/01/17 1707 Last data filed at 02/01/17 0900  Gross per 24 hour  Intake             1160 ml  Output                0 ml  Net             1160 ml    Weight trends: Filed Weights   01/31/17 1507 01/31/17 1919  Weight: 101 kg (222 lb 10.6 oz) 99.7 kg (219 lb 14.4 oz)    Physical Exam: General:  no acute distress  HEENT Anicteric, moist oral mucous membranes  Neck:  supple  Lungs: normal breathing effort  Heart::  regular, no rub or gallop  Abdomen: Soft, nontender  Extremities: no peripheral edema, left foot wound  Neurologic: Alert and oriented  Skin: No acute rashes  Access: AVF          Lab results: Basic Metabolic Panel:  Recent Labs Lab 01/31/17 1600 02/01/17 0615  NA 132* 133*  K 4.0 4.3  CL 94* 95*  CO2 28 28  GLUCOSE 182* 95  BUN 37* 46*  CREATININE 9.34* 11.49*  CALCIUM 8.7* 8.0*    Liver Function Tests:  Recent Labs Lab 01/31/17 1600  AST 11*  ALT 10*  ALKPHOS 68  BILITOT 0.7  PROT 8.9*  ALBUMIN 3.3*    Recent Labs Lab 01/31/17 1600  LIPASE 11   No results for input(s): AMMONIA in the last 168 hours.  CBC:  Recent Labs Lab 01/31/17 1600 02/01/17 0615  WBC 15.2* 20.1*  NEUTROABS 13.3*  --   HGB 11.5* 9.5*  HCT 34.2* 28.6*  MCV 88.7 88.5  PLT 237 212    Cardiac Enzymes:  Recent Labs Lab 01/31/17 1600  TROPONINI 0.03*    BNP: Invalid input(s): POCBNP  CBG:  Recent Labs Lab 01/31/17 2139 02/01/17 0745 02/01/17 1151 02/01/17 1651  GLUCAP 203* 97 137* 156*     Microbiology: Recent Results (from the past 720 hour(s))  Culture, blood (Routine x 2)     Status: None (Preliminary result)   Collection Time: 01/31/17  3:32 PM  Result Value Ref Range Status   Specimen Description BLOOD LEFT ANTECUBITAL  Final   Special Requests   Final    BOTTLES DRAWN AEROBIC AND ANAEROBIC Blood Culture adequate volume   Culture NO GROWTH < 24 HOURS  Final   Report Status PENDING  Incomplete  Culture, blood (Routine x 2)     Status: None (Preliminary result)   Collection Time: 01/31/17  3:32 PM  Result Value Ref Range Status   Specimen Description BLOOD LEFT FOREARM  Final   Special Requests   Final    BOTTLES DRAWN AEROBIC AND ANAEROBIC Blood Culture adequate volume   Culture NO GROWTH <  24 HOURS  Final   Report Status PENDING  Incomplete  Aerobic/Anaerobic Culture (surgical/deep wound)     Status: None (Preliminary result)   Collection Time: 01/31/17  4:00 PM  Result Value Ref Range Status   Specimen Description ABSCESS  Final   Special Requests Immunocompromised  Final   Gram Stain   Final    MODERATE WBC PRESENT,BOTH PMN AND MONONUCLEAR ABUNDANT GRAM NEGATIVE RODS ABUNDANT GRAM POSITIVE COCCI IN PAIRS RARE GRAM POSITIVE RODS    Culture   Final    TOO YOUNG TO READ Performed at [facility], [place] [address], [place],  [address]    Report Status PENDING  Incomplete  MRSA PCR Screening     Status: None   Collection Time: 02/01/17  8:26 AM  Result Value Ref Range Status   MRSA by PCR NEGATIVE NEGATIVE Final    Comment:        The GeneXpert MRSA Assay (FDA approved for NASAL specimens only), is one component of a comprehensive MRSA colonization surveillance program. It is not intended to diagnose MRSA infection nor to guide or monitor treatment for MRSA infections.      Coagulation Studies:  Recent Labs  01/31/17 1600  LABPROT 15.6*  INR 1.23    Urinalysis: No results for input(s): COLORURINE, LABSPEC, PHURINE,  GLUCOSEU, HGBUR, BILIRUBINUR, KETONESUR, PROTEINUR, UROBILINOGEN, NITRITE, LEUKOCYTESUR in the last 72 hours.  Invalid input(s): APPERANCEUR      Imaging: Mr Foot Left Wo Contrast  Result Date: 02/01/2017 CLINICAL DATA:  Pt has a large wound on his left foot that is apparently been present and gradually worsening for at least weeks if not months. EXAM: MRI OF THE LEFT FOOT WITHOUT CONTRAST TECHNIQUE: Multiplanar, multisequence MR imaging of the left forefoot was performed. No intravenous contrast was administered. COMPARISON:  None. FINDINGS: Bones/Joint/Cartilage Large soft tissue ulcer overlying the fifth MTP joint. Cortical destruction of the fifth metatarsal head with severe marrow edema most consistent with osteomyelitis. Cortical irregularity of the fifth proximal phalanx with severe edema concerning for osteomyelitis. No other marrow signal abnormality. No fracture or dislocation. Normal alignment. No joint effusion. Ligaments Collateral ligaments are intact.  Lisfranc ligament is intact. Muscles and Tendons Flexor, peroneal and extensor compartment tendons are intact. Soft tissue No fluid collection or hematoma.  No soft tissue mass. IMPRESSION: 1. Large soft tissue ulcer overlying the fifth MTP joint. Findings concerning for osteomyelitis of the fifth metatarsal head and possibly base of the fifth proximal phalanx. Electronically Signed   By: Kathreen Devoid   On: 02/01/2017 14:46   Dg Chest Portable 1 View  Result Date: 01/31/2017 CLINICAL DATA:  Code sepsis EXAM: PORTABLE CHEST 1 VIEW COMPARISON:  None. FINDINGS: Top-normal heart size. Normal mediastinal contour. No pneumothorax. No pleural effusion. Lungs appear clear, with no acute consolidative airspace disease and no pulmonary edema. IMPRESSION: No active disease. Electronically Signed   By: Ilona Sorrel M.D.   On: 01/31/2017 16:08   Dg Foot Complete Left  Result Date: 01/31/2017 CLINICAL DATA:  Ulcer overlying the distal aspect of the  fifth metatarsal bone. Rule out osteomyelitis. EXAM: LEFT FOOT - COMPLETE 3+ VIEW COMPARISON:  None. FINDINGS: There is a large also or adjacent to the undersurface of the distal aspect of the fifth metatarsal bone and fifth MTP joints. There is no focal areas of bony destruction. No fractures or dislocation. IMPRESSION: 1. Large ulcer overlying the distal aspect of the fifth metatarsal. No underlying bony destruction identified. Electronically Signed  By: Kerby Moors M.D.   On: 01/31/2017 16:09      Assessment & Plan: Pt is a 46 y.o. AA male with DM-2, HTN, HIV, was admitted on 01/31/2017 with foot ulcer and osteomyelitis  [facility]/ [address] [facility], MWF/ 4.5 hrs  1. ESRD 2. AOCKD 3. SHPTH 4. Left Diabetic foot ulcer and osteomyelitis  Plan: HD MWF while in hospital Monitor phos and Hgb Will schedule HD for tomorrow

## 2017-02-01 NOTE — Progress Notes (Signed)
Initial Nutrition Assessment  DOCUMENTATION CODES:   Non-severe (moderate) malnutrition in context of chronic illness  INTERVENTION:  -Add Prostat BID; pt agreeable to trying -Snacks TID between meals -Discussed importance of adequate protein and calories for wound healing, weight maintenance, increased nutritional needs with HD. Reviewed good sources of protein; encouraged smaller, more frequent meals. Pt receptive  NUTRITION DIAGNOSIS:   Malnutrition related to chronic illness as evidenced by mild depletion of muscle mass, energy intake < 75% for > or equal to 1 month.  GOAL:   Patient will meet greater than or equal to 90% of their needs  MONITOR:   PO intake, Supplement acceptance, Labs, Weight trends  REASON FOR ASSESSMENT:   Malnutrition Screening Tool    ASSESSMENT:   46 yo male admitted with fever, chills, N/V with sepsis due to left foot ulcer infection. Pt with hx of HIV, ESRD on HD, HTN, DM.  Pt ate 100% at breakfast this AM. Reports appetite is fair. Pt reports he typically eats 1.5 meals per day, not using any nutritional supplements. Meals might consist of sandwich or meat and 2 sides.  Pt started dialysis in April 2017. At that time, pt weighed 265-270 pounds; current wt 219 pounds (99.7 kg) 17-18.9% wt loss. Pt reports dry weight of 100 kg. Pt reports wt loss of 10 pounds in past couple of months (4.4% wt loss)  Nutrition-Focused physical exam completed. Findings are no fat depletion, mild muscle depletion, and no edema.   Labs: sodium 133 Meds: phoslo, ss novolog  Diet Order:  Diet renal/carb modified with fluid restriction Diet-HS Snack? Nothing; Room service appropriate? Yes; Fluid consistency: Thin  Skin:  Wound (see comment) (left infected foot ulceration)  Last BM:  01/28/17  Height:   Ht Readings from Last 1 Encounters:  01/31/17 6' 6"  (1.981 m)    Weight:   Wt Readings from Last 1 Encounters:  01/31/17 219 lb 14.4 oz (99.7 kg)    BMI:   Body mass index is 25.41 kg/m.  Estimated Nutritional Needs:   Kcal:  2500-3000 kcals  Protein:  125-150 g  Fluid:  1000 plus UOP  EDUCATION NEEDS:   Education needs addressed  Kerman Passey [place], [place], LDN [phone] Pager  [phone] Weekend/On-Call Pager

## 2017-02-02 LAB — GLUCOSE, CAPILLARY
Glucose-Capillary: 135 mg/dL — ABNORMAL HIGH (ref 65–99)
Glucose-Capillary: 163 mg/dL — ABNORMAL HIGH (ref 65–99)

## 2017-02-02 LAB — RENAL FUNCTION PANEL
ALBUMIN: 2.7 g/dL — AB (ref 3.5–5.0)
Anion gap: 11 (ref 5–15)
BUN: 68 mg/dL — AB (ref 6–20)
CO2: 26 mmol/L (ref 22–32)
Calcium: 8 mg/dL — ABNORMAL LOW (ref 8.9–10.3)
Chloride: 97 mmol/L — ABNORMAL LOW (ref 101–111)
Creatinine, Ser: 13.76 mg/dL — ABNORMAL HIGH (ref 0.61–1.24)
GFR calc Af Amer: 4 mL/min — ABNORMAL LOW (ref >60)
GFR, EST NON AFRICAN AMERICAN: 4 mL/min — AB (ref >60)
GLUCOSE: 136 mg/dL — AB (ref 65–99)
PHOSPHORUS: 5.7 mg/dL — AB (ref 2.5–4.6)
Potassium: 4.3 mmol/L (ref 3.5–5.1)
SODIUM: 134 mmol/L — AB (ref 135–145)

## 2017-02-02 LAB — CBC
HEMATOCRIT: 28.1 % — AB (ref 40.0–52.0)
HEMOGLOBIN: 9.4 g/dL — AB (ref 13.0–18.0)
MCH: 29.8 pg (ref 26.0–34.0)
MCHC: 33.5 g/dL (ref 32.0–36.0)
MCV: 88.9 fL (ref 80.0–100.0)
Platelets: 244 10*3/uL (ref 150–440)
RBC: 3.16 MIL/uL — ABNORMAL LOW (ref 4.40–5.90)
RDW: 17.4 % — AB (ref 11.5–14.5)
WBC: 16 10*3/uL — ABNORMAL HIGH (ref 3.8–10.6)

## 2017-02-02 LAB — HIV 1/2 AB DIFFERENTIATION
HIV 1 AB: POSITIVE — AB
HIV 2 AB: NEGATIVE

## 2017-02-02 LAB — HIV ANTIBODY (ROUTINE TESTING W REFLEX): HIV Screen 4th Generation wRfx: REACTIVE — AB

## 2017-02-02 MED ORDER — VANCOMYCIN HCL IN DEXTROSE 1-5 GM/200ML-% IV SOLN
1000.0000 mg | INTRAVENOUS | Status: DC
  Administered 2017-02-02: 1000 mg via INTRAVENOUS
  Filled 2017-02-02 (×2): qty 200

## 2017-02-02 NOTE — Progress Notes (Signed)
Handoff report given to Hoyle Sauer, RN.

## 2017-02-02 NOTE — Progress Notes (Signed)
[place] INFECTIOUS DISEASE PROGRESS NOTE Date of Admission:  01/31/2017     ID: Nathan Ayers is a 46 y.o. male with DM foot infection and osteo, HIV, ESRD Active Problems:   Sepsis ([place])   Malnutrition of moderate degree   Subjective: No fevers,   ROS  Eleven systems are reviewed and negative except per hpi  Medications:  Antibiotics Given (last 72 hours)    Date/Time Action Medication Dose Rate   01/31/17 2118 Given   abacavir (ZIAGEN) tablet 300 mg 300 mg    01/31/17 2118 Given   dolutegravir (TIVICAY) tablet 50 mg 50 mg    01/31/17 2118 Given   vancomycin (VANCOCIN) IVPB 1000 mg/200 mL premix 1,000 mg 200 mL/hr   02/01/17 0704 Given   piperacillin-tazobactam (ZOSYN) IVPB 3.375 g 3.375 g 12.5 mL/hr   02/01/17 6761 Given  [per patient preference]   abacavir (ZIAGEN) tablet 300 mg 300 mg    02/01/17 0839 Given  [per patient preference]   dolutegravir (TIVICAY) tablet 50 mg 50 mg    02/01/17 1720 Given   piperacillin-tazobactam (ZOSYN) IVPB 3.375 g 3.375 g 12.5 mL/hr   02/01/17 2130 Given   abacavir (ZIAGEN) tablet 300 mg 300 mg    02/01/17 2130 Given   dolutegravir (TIVICAY) tablet 50 mg 50 mg    02/01/17 2131 Given   lamiVUDine (EPIVIR) 10 MG/ML solution 25 mg 25 mg    02/02/17 0651 Given   piperacillin-tazobactam (ZOSYN) IVPB 3.375 g 3.375 g 12.5 mL/hr   02/02/17 1201 Given   vancomycin (VANCOCIN) IVPB 1000 mg/200 mL premix 1,000 mg 200 mL/hr   02/02/17 1419 Given   dolutegravir (TIVICAY) tablet 50 mg 50 mg    02/02/17 1420 Given   abacavir (ZIAGEN) tablet 300 mg 300 mg      . abacavir  300 mg Oral BID  . aspirin  81 mg Oral Daily  . calcium acetate  667 mg Oral TID WC  . carvedilol  25 mg Oral BID WC  . dolutegravir  50 mg Oral BID  . feeding supplement (PRO-STAT SUGAR FREE 64)  30 mL Oral BID  . heparin  5,000 Units Subcutaneous Q8H  . insulin aspart  0-5 Units Subcutaneous QHS  . insulin aspart  0-9 Units Subcutaneous TID WC  . lamiVUDine  25  mg Oral Daily  . losartan  100 mg Oral Daily  . minoxidil  2.5 mg Oral BID  . piperacillin-tazobactam (ZOSYN)  IV  3.375 g Intravenous Q12H  . sodium chloride flush  3 mL Intravenous Q12H    Objective: Vital signs in last 24 hours: Temp:  [98 F (36.7 C)-98.2 F (36.8 C)] 98 F (36.7 C) (03/28 1306) Pulse Rate:  [77-83] 79 (03/28 1308) Resp:  [12-19] 16 (03/28 1308) BP: (124-186)/(64-100) 178/96 (03/28 1308) SpO2:  [95 %-99 %] 97 % (03/28 1308) Weight:  [99.8 kg (220 lb)-101.5 kg (223 lb 12.8 oz)] 99.8 kg (220 lb) (03/28 1306) Constitutional: He is oriented to person, place, and time. He appears well-developed and well-nourished. No distress.  HENT: anicteric  Mouth/Throat: Oropharynx is clear and moist. No oropharyngeal exudate.  Cardiovascular: Normal rate, regular rhythm and normal heart sounds.  Pulmonary/Chest: Effort normal and breath sounds normal. No respiratory distress. He has no wheezes.  Abdominal: Soft. Bowel sounds are normal. He exhibits no distension. There is no tenderness.  Lymphadenopathy: He has no cervical adenopathy.  Neurological: He is alert and oriented to person, place, and time.  Skin: L foot  with large 2-3 cm circular ulcer on plantar surface laterally with necrotic tissue, odor, drainage. Probes to bone.  Psychiatric: He has a flat  affect. His behavior is normal.   Lab Results  Recent Labs  02/01/17 0615 02/02/17 0935  WBC 20.1* 16.0*  HGB 9.5* 9.4*  HCT 28.6* 28.1*  NA 133* 134*  K 4.3 4.3  CL 95* 97*  CO2 28 26  BUN 46* 68*  CREATININE 11.49* 13.76*    Microbiology: @micro @ Studies/Results: Mr Foot Left Wo Contrast  Result Date: 02/01/2017 CLINICAL DATA:  Pt has a large wound on his left foot that is apparently been present and gradually worsening for at least weeks if not months. EXAM: MRI OF THE LEFT FOOT WITHOUT CONTRAST TECHNIQUE: Multiplanar, multisequence MR imaging of the left forefoot was performed. No intravenous contrast was  administered. COMPARISON:  None. FINDINGS: Bones/Joint/Cartilage Large soft tissue ulcer overlying the fifth MTP joint. Cortical destruction of the fifth metatarsal head with severe marrow edema most consistent with osteomyelitis. Cortical irregularity of the fifth proximal phalanx with severe edema concerning for osteomyelitis. No other marrow signal abnormality. No fracture or dislocation. Normal alignment. No joint effusion. Ligaments Collateral ligaments are intact.  Lisfranc ligament is intact. Muscles and Tendons Flexor, peroneal and extensor compartment tendons are intact. Soft tissue No fluid collection or hematoma.  No soft tissue mass. IMPRESSION: 1. Large soft tissue ulcer overlying the fifth MTP joint. Findings concerning for osteomyelitis of the fifth metatarsal head and possibly base of the fifth proximal phalanx. Electronically Signed   By: Kathreen Devoid   On: 02/01/2017 14:46   Dg Chest Portable 1 View  Result Date: 01/31/2017 CLINICAL DATA:  Code sepsis EXAM: PORTABLE CHEST 1 VIEW COMPARISON:  None. FINDINGS: Top-normal heart size. Normal mediastinal contour. No pneumothorax. No pleural effusion. Lungs appear clear, with no acute consolidative airspace disease and no pulmonary edema. IMPRESSION: No active disease. Electronically Signed   By: Ilona Sorrel M.D.   On: 01/31/2017 16:08   Dg Foot Complete Left  Result Date: 01/31/2017 CLINICAL DATA:  Ulcer overlying the distal aspect of the fifth metatarsal bone. Rule out osteomyelitis. EXAM: LEFT FOOT - COMPLETE 3+ VIEW COMPARISON:  None. FINDINGS: There is a large also or adjacent to the undersurface of the distal aspect of the fifth metatarsal bone and fifth MTP joints. There is no focal areas of bony destruction. No fractures or dislocation. IMPRESSION: 1. Large ulcer overlying the distal aspect of the fifth metatarsal. No underlying bony destruction identified. Electronically Signed   By: Kerby Moors M.D.   On: 01/31/2017 16:09     Assessment/Plan: Nathan Ayers is a 46 y.o. male with well controlled HIV, DM, PN, ESRD on HD admitted with L foot severe ulcer and underlying osteomyelitis of 5th MT head. Cultures pending  Recommendations 1. DM foot infection with osteomyelitis- podiatry will likely operate. cxs pending Continue vanco and zosyn Will need prolonged IV abx but can be given at HD  2. HIV- well controlled. Cont current meds Thank you very much for the consult. Will follow with you.  Shanedra Lave P   02/02/2017, 3:59 PM

## 2017-02-02 NOTE — Progress Notes (Signed)
Pre HD

## 2017-02-02 NOTE — Progress Notes (Signed)
[facility] [place] NAME: Nathan Ayers    MR#:  [id number]  DATE OF BIRTH:  05/11/1971  SUBJECTIVE:   Patient here due to sepsis secondary to a left diabetic foot ulcer. MRI yesterday showing evidence of Osteomyelitis.  Going to OR on Friday as per Podiatry. Seen at HD today and no other complaints.   REVIEW OF SYSTEMS:    Review of Systems  Constitutional: Negative for chills and fever.  HENT: Negative for congestion and tinnitus.   Eyes: Negative for blurred vision and double vision.  Respiratory: Negative for cough, shortness of breath and wheezing.   Cardiovascular: Negative for chest pain, orthopnea and PND.  Gastrointestinal: Negative for abdominal pain, diarrhea, nausea and vomiting.  Genitourinary: Negative for dysuria and hematuria.  Neurological: Positive for weakness. Negative for dizziness, sensory change and focal weakness.  All other systems reviewed and are negative.   Nutrition: Renal/Carb diet Tolerating Diet: Yes Tolerating PT: Await Eval   DRUG ALLERGIES:  No Known Allergies  VITALS:  Blood pressure (!) 178/96, pulse 79, temperature 98 F (36.7 C), temperature source Oral, resp. rate 16, height 6' 6"  (1.981 m), weight 99.8 kg (220 lb), SpO2 97 %.  PHYSICAL EXAMINATION:   Physical Exam  GENERAL:  46 y.o.-year-old patient lying in bed in no acute distress.  EYES: Pupils equal, round, reactive to light and accommodation. No scleral icterus. Extraocular muscles intact.  HEENT: Head atraumatic, normocephalic. Oropharynx and nasopharynx clear.  NECK:  Supple, no jugular venous distention. No thyroid enlargement, no tenderness.  LUNGS: Normal breath sounds bilaterally, no wheezing, rales, rhonchi. No use of accessory muscles of respiration.  CARDIOVASCULAR: S1, S2 normal. No murmurs, rubs, or gallops.  ABDOMEN: Soft, nontender, nondistended. Bowel sounds present. No organomegaly or mass.  EXTREMITIES: No cyanosis,  clubbing or edema b/l.    NEUROLOGIC: Cranial nerves II through XII are intact. No focal Motor or sensory deficits b/l.   PSYCHIATRIC: The patient is alert and oriented x 3.  SKIN: No obvious rash, lesion, left diabetic foot ulcer w/ foul smelling drainage.    LABORATORY PANEL:   CBC  Recent Labs Lab 02/02/17 0935  WBC 16.0*  HGB 9.4*  HCT 28.1*  PLT 244   ------------------------------------------------------------------------------------------------------------------  Chemistries   Recent Labs Lab 01/31/17 1600  02/02/17 0935  NA 132*  < > 134*  K 4.0  < > 4.3  CL 94*  < > 97*  CO2 28  < > 26  GLUCOSE 182*  < > 136*  BUN 37*  < > 68*  CREATININE 9.34*  < > 13.76*  CALCIUM 8.7*  < > 8.0*  AST 11*  --   --   ALT 10*  --   --   ALKPHOS 68  --   --   BILITOT 0.7  --   --   < > = values in this interval not displayed. ------------------------------------------------------------------------------------------------------------------  Cardiac Enzymes  Recent Labs Lab 01/31/17 1600  TROPONINI 0.03*   ------------------------------------------------------------------------------------------------------------------  RADIOLOGY:  Mr Foot Left Wo Contrast  Result Date: 02/01/2017 CLINICAL DATA:  Pt has a large wound on his left foot that is apparently been present and gradually worsening for at least weeks if not months. EXAM: MRI OF THE LEFT FOOT WITHOUT CONTRAST TECHNIQUE: Multiplanar, multisequence MR imaging of the left forefoot was performed. No intravenous contrast was administered. COMPARISON:  None. FINDINGS: Bones/Joint/Cartilage Large soft tissue ulcer overlying the fifth MTP joint. Cortical destruction of the  fifth metatarsal head with severe marrow edema most consistent with osteomyelitis. Cortical irregularity of the fifth proximal phalanx with severe edema concerning for osteomyelitis. No other marrow signal abnormality. No fracture or dislocation. Normal  alignment. No joint effusion. Ligaments Collateral ligaments are intact.  Lisfranc ligament is intact. Muscles and Tendons Flexor, peroneal and extensor compartment tendons are intact. Soft tissue No fluid collection or hematoma.  No soft tissue mass. IMPRESSION: 1. Large soft tissue ulcer overlying the fifth MTP joint. Findings concerning for osteomyelitis of the fifth metatarsal head and possibly base of the fifth proximal phalanx. Electronically Signed   By: Kathreen Devoid   On: 02/01/2017 14:46     ASSESSMENT AND PLAN:   46 year old history of HIV, hypertension, diabetes, end-stage renal disease hemodialysis, diabetic neuropathy who presented to the hospital due to fever and a left foot ulcer with foul-smelling drainage.  1. Sepsis-patient met criteria on admission given leukocytosis, fever -Continue IV Zosyn, MRSA PCR (-) and d/c Vanc. For now.  -Follow cultures, currently afebrile.  2. Left foot ulcer with foul-smelling drainage- cause of patient's sepsis. -Continue IV Zosyn. Appreciate podiatry input MRI yesterday showing evidence of osteomyelitis in the fifth metatarsal head and proximal base of the fifth proximal phalanx. Podiatry plans on doing surgery on Friday.  3. End Stage renal disease on hemodialysis-Nephrology following. Cont. HD on MWF.   4. Hx of HIV - cont. HAART.   - no acute issue. Appreciate ID input and cont. Current care.    5. DM - cont. SSI.   - BS stable.   6. Imdur hyperparathyroidism-continue PhosLo.  7. Essential hypertension-continue losartan, minoxidil, Coreg.   All the records are reviewed and case discussed with Care Management/Social Worker. Management plans discussed with the patient, family and they are in agreement.  CODE STATUS: Full Code  DVT Prophylaxis: Hep. SQ  TOTAL TIME TAKING CARE OF THIS PATIENT: 25 minutes.   POSSIBLE D/C IN 2-3 DAYS, DEPENDING ON CLINICAL CONDITION.   Henreitta Leber M.D on 02/02/2017 at 4:23 PM  Between 7am to  6pm - Pager - [phone]  After 6pm go to [email or web address] - [employer] [facility]  Office  [phone]  CC: Primary care physician; No PCP Per Patient

## 2017-02-02 NOTE — Progress Notes (Signed)
MRI consistent with osteomyelitis. Plan for surgery Friday. Will d/w pt tomorrow.  MRI: Large soft tissue ulcer overlying the fifth MTP joint. Findings concerning for osteomyelitis of the fifth metatarsal head and possibly base of the fifth proximal phalanx.

## 2017-02-02 NOTE — Care Management (Signed)
Amanda Morris HD liaison notified of admission.  

## 2017-02-02 NOTE — Progress Notes (Signed)
HD completed without issue. Goal met. 1.5L uf. Patient tolerated well

## 2017-02-02 NOTE — Progress Notes (Signed)
Pharmacy Antibiotic Note  Nathan Ayers is a 46 y.o. male admitted on 01/31/2017 with sepsis.  Pharmacy has been consulted for vancomycin and piperacillin/tazobactam dosing.  Patient has ESRD on HD.  Plan: Patient received 2 g IV Loading dose.   Will start Vancomycin 1 g IV with each dialysis session.   Goal vancomycin level 15-25 mcg/mL pre HD - to be checked prior to 3rd session per protocol on 4/2   Piperacillin/tazobactam 3.375 g IV q12h EI  Height: 6' 6"  (198.1 cm) Weight: 223 lb 12.8 oz (101.5 kg) IBW/kg (Calculated) : 91.4  Temp (24hrs), Avg:98.3 F (36.8 C), Min:98.1 F (36.7 C), Max:98.5 F (36.9 C)   Recent Labs Lab 01/31/17 1600 02/01/17 0615  WBC 15.2* 20.1*  CREATININE 9.34* 11.49*  LATICACIDVEN 1.6  --     Estimated Creatinine Clearance: 10.7 mL/min (A) (by C-G formula based on SCr of 11.49 mg/dL (H)).    No Known Allergies  Antimicrobials this admission: Piperacillin/tazobactam 3/26 >>  vancomycin 3/26 >>   Dose adjustments this admission:  Microbiology results: 3/26 BCx: Sent 3/26 UCx: Sent   Thank you for allowing pharmacy to be a part of this patient's care.  Larene Beach, PharmD Clinical Pharmacist 02/02/2017 10:01 AM

## 2017-02-02 NOTE — Progress Notes (Signed)
HD initiated without issue. Patient has no complaints. States "my foot is numb but doesn't hurt."

## 2017-02-02 NOTE — Progress Notes (Signed)
Pre hd assessment

## 2017-02-02 NOTE — Progress Notes (Signed)
Subjective:   Patient seen during dialysis Tolerating well   HEMODIALYSIS FLOWSHEET:  Blood Flow Rate (mL/min): 350 mL/min Arterial Pressure (mmHg): -220 mmHg Venous Pressure (mmHg): 170 mmHg Transmembrane Pressure (mmHg): 50 mmHg Ultrafiltration Rate (mL/min): 570 mL/min Dialysate Flow Rate (mL/min): 800 ml/min Conductivity: Machine : 14.1 Conductivity: Machine : 14.1 Dialysis Fluid Bolus: Normal Saline Bolus Amount (mL): 100 mL Dialysate Change:  (3k 2.5ca) Intra-Hemodialysis Comments: system flushed, 1240 ml removed. patient resting with eyes closed    Objective:  Vital signs in last 24 hours:  Temp:  [98.1 F (36.7 C)-98.5 F (36.9 C)] 98.2 F (36.8 C) (03/28 0930) Pulse Rate:  [77-83] 81 (03/28 1130) Resp:  [12-19] 15 (03/28 1130) BP: (116-186)/(53-99) 186/99 (03/28 1130) SpO2:  [96 %-99 %] 96 % (03/28 1130) Weight:  [101.5 kg (223 lb 12.8 oz)] 101.5 kg (223 lb 12.8 oz) (03/28 0930)  Weight change:  Filed Weights   01/31/17 1507 01/31/17 1919 02/02/17 0930  Weight: 101 kg (222 lb 10.6 oz) 99.7 kg (219 lb 14.4 oz) 101.5 kg (223 lb 12.8 oz)    Intake/Output:    Intake/Output Summary (Last 24 hours) at 02/02/17 1158 Last data filed at 02/02/17 1019  Gross per 24 hour  Intake              585 ml  Output                0 ml  Net              585 ml     Physical Exam: General: No acute distress, laying in the bed   HEENT Anicteric, moist oral mucous membranes   Neck Supple   Pulm/lungs Normal breathing effort   CVS/Heart Regular rhythm, no rub or gallop   Abdomen:  Soft, nontender   Extremities: Left foot ulcer   Neurologic: Alert, oriented   Skin: No acute rashes   Access: AV fistula        Basic Metabolic Panel:   Recent Labs Lab 01/31/17 1600 02/01/17 0615 02/02/17 0935  NA 132* 133* 134*  K 4.0 4.3 4.3  CL 94* 95* 97*  CO2 28 28 26   GLUCOSE 182* 95 136*  BUN 37* 46* 68*  CREATININE 9.34* 11.49* 13.76*  CALCIUM 8.7* 8.0* 8.0*  PHOS   --   --  5.7*     CBC:  Recent Labs Lab 01/31/17 1600 02/01/17 0615 02/02/17 0935  WBC 15.2* 20.1* 16.0*  NEUTROABS 13.3*  --   --   HGB 11.5* 9.5* 9.4*  HCT 34.2* 28.6* 28.1*  MCV 88.7 88.5 88.9  PLT 237 212 244      Microbiology:  Recent Results (from the past 720 hour(s))  Culture, blood (Routine x 2)     Status: None (Preliminary result)   Collection Time: 01/31/17  3:32 PM  Result Value Ref Range Status   Specimen Description BLOOD LEFT ANTECUBITAL  Final   Special Requests   Final    BOTTLES DRAWN AEROBIC AND ANAEROBIC Blood Culture adequate volume   Culture NO GROWTH 2 DAYS  Final   Report Status PENDING  Incomplete  Culture, blood (Routine x 2)     Status: None (Preliminary result)   Collection Time: 01/31/17  3:32 PM  Result Value Ref Range Status   Specimen Description BLOOD LEFT FOREARM  Final   Special Requests   Final    BOTTLES DRAWN AEROBIC AND ANAEROBIC Blood Culture adequate volume   Culture NO GROWTH  2 DAYS  Final   Report Status PENDING  Incomplete  Aerobic/Anaerobic Culture (surgical/deep wound)     Status: None (Preliminary result)   Collection Time: 01/31/17  4:00 PM  Result Value Ref Range Status   Specimen Description ABSCESS  Final   Special Requests Immunocompromised  Final   Gram Stain   Final    MODERATE WBC PRESENT,BOTH PMN AND MONONUCLEAR ABUNDANT GRAM NEGATIVE RODS ABUNDANT GRAM POSITIVE COCCI IN PAIRS RARE GRAM POSITIVE RODS    Culture   Final    TOO YOUNG TO READ Performed at [facility], [place] [address], [place],  [address]    Report Status PENDING  Incomplete  MRSA PCR Screening     Status: None   Collection Time: 02/01/17  8:26 AM  Result Value Ref Range Status   MRSA by PCR NEGATIVE NEGATIVE Final    Comment:        The GeneXpert MRSA Assay (FDA approved for NASAL specimens only), is one component of a comprehensive MRSA colonization surveillance program. It is not intended to diagnose MRSA infection  nor to guide or monitor treatment for MRSA infections.     Coagulation Studies:  Recent Labs  01/31/17 1600  LABPROT 15.6*  INR 1.23    Urinalysis: No results for input(s): COLORURINE, LABSPEC, PHURINE, GLUCOSEU, HGBUR, BILIRUBINUR, KETONESUR, PROTEINUR, UROBILINOGEN, NITRITE, LEUKOCYTESUR in the last 72 hours.  Invalid input(s): APPERANCEUR    Imaging: Mr Foot Left Wo Contrast  Result Date: 02/01/2017 CLINICAL DATA:  Pt has a large wound on his left foot that is apparently been present and gradually worsening for at least weeks if not months. EXAM: MRI OF THE LEFT FOOT WITHOUT CONTRAST TECHNIQUE: Multiplanar, multisequence MR imaging of the left forefoot was performed. No intravenous contrast was administered. COMPARISON:  None. FINDINGS: Bones/Joint/Cartilage Large soft tissue ulcer overlying the fifth MTP joint. Cortical destruction of the fifth metatarsal head with severe marrow edema most consistent with osteomyelitis. Cortical irregularity of the fifth proximal phalanx with severe edema concerning for osteomyelitis. No other marrow signal abnormality. No fracture or dislocation. Normal alignment. No joint effusion. Ligaments Collateral ligaments are intact.  Lisfranc ligament is intact. Muscles and Tendons Flexor, peroneal and extensor compartment tendons are intact. Soft tissue No fluid collection or hematoma.  No soft tissue mass. IMPRESSION: 1. Large soft tissue ulcer overlying the fifth MTP joint. Findings concerning for osteomyelitis of the fifth metatarsal head and possibly base of the fifth proximal phalanx. Electronically Signed   By: Kathreen Devoid   On: 02/01/2017 14:46   Dg Chest Portable 1 View  Result Date: 01/31/2017 CLINICAL DATA:  Code sepsis EXAM: PORTABLE CHEST 1 VIEW COMPARISON:  None. FINDINGS: Top-normal heart size. Normal mediastinal contour. No pneumothorax. No pleural effusion. Lungs appear clear, with no acute consolidative airspace disease and no pulmonary  edema. IMPRESSION: No active disease. Electronically Signed   By: Ilona Sorrel M.D.   On: 01/31/2017 16:08   Dg Foot Complete Left  Result Date: 01/31/2017 CLINICAL DATA:  Ulcer overlying the distal aspect of the fifth metatarsal bone. Rule out osteomyelitis. EXAM: LEFT FOOT - COMPLETE 3+ VIEW COMPARISON:  None. FINDINGS: There is a large also or adjacent to the undersurface of the distal aspect of the fifth metatarsal bone and fifth MTP joints. There is no focal areas of bony destruction. No fractures or dislocation. IMPRESSION: 1. Large ulcer overlying the distal aspect of the fifth metatarsal. No underlying bony destruction identified. Electronically Signed   By:  Kerby Moors M.D.   On: 01/31/2017 16:09     Medications:    . abacavir  300 mg Oral BID  . aspirin  81 mg Oral Daily  . calcium acetate  667 mg Oral TID WC  . carvedilol  25 mg Oral BID WC  . dolutegravir  50 mg Oral BID  . feeding supplement (PRO-STAT SUGAR FREE 64)  30 mL Oral BID  . heparin  5,000 Units Subcutaneous Q8H  . insulin aspart  0-5 Units Subcutaneous QHS  . insulin aspart  0-9 Units Subcutaneous TID WC  . lamiVUDine  25 mg Oral Daily  . losartan  100 mg Oral Daily  . minoxidil  2.5 mg Oral BID  . piperacillin-tazobactam (ZOSYN)  IV  3.375 g Intravenous Q12H  . sodium chloride flush  3 mL Intravenous Q12H  . vancomycin  1,000 mg Intravenous Q M,W,F-HD   sodium chloride, sodium chloride, sodium chloride, acetaminophen **OR** acetaminophen, albuterol, alteplase, feeding supplement (NEPRO CARB STEADY), heparin, HYDROcodone-acetaminophen, lidocaine (PF), lidocaine-prilocaine, ondansetron **OR** ondansetron (ZOFRAN) IV, senna-docusate, sodium chloride flush  Assessment/ Plan:  46 y.o.African American male with ESRD on HD since APR 2017, DM-2, HTN, HIV, was admitted on 01/31/2017 with Left foot diabetic ulcer and osteomyelitis  [facility]/ [address] [facility], MWF/ 4.5 hrs  1. ESRD 2. AOCKD 3. SHPTH 4. Left Diabetic  foot ulcer and osteomyelitis  Plan: HD MWF while in hospital Monitor phos and Hgb  Patient seen during dialysis Tolerating well MRI confirms osteomyelitis of foot.  Evaluated by podiatry (Dr Vickki Muff) and infectious disease. Recommended vancomycin and Zosyn Possibility of foot amputation this admission as determined by Podiatrist.      LOS: 2 Karmin Kasprzak 3/28/201811:58 AM

## 2017-02-02 NOTE — Progress Notes (Signed)
Post HD assessment unchanged

## 2017-02-03 LAB — CBC
HEMATOCRIT: 31.4 % — AB (ref 40.0–52.0)
HEMOGLOBIN: 10.7 g/dL — AB (ref 13.0–18.0)
MCH: 30.5 pg (ref 26.0–34.0)
MCHC: 34 g/dL (ref 32.0–36.0)
MCV: 89.7 fL (ref 80.0–100.0)
Platelets: 245 10*3/uL (ref 150–440)
RBC: 3.5 MIL/uL — AB (ref 4.40–5.90)
RDW: 17.3 % — ABNORMAL HIGH (ref 11.5–14.5)
WBC: 9.7 10*3/uL (ref 3.8–10.6)

## 2017-02-03 LAB — GLUCOSE, CAPILLARY
GLUCOSE-CAPILLARY: 136 mg/dL — AB (ref 65–99)
GLUCOSE-CAPILLARY: 128 mg/dL — AB (ref 65–99)
GLUCOSE-CAPILLARY: 145 mg/dL — AB (ref 65–99)
Glucose-Capillary: 150 mg/dL — ABNORMAL HIGH (ref 65–99)
Glucose-Capillary: 124 mg/dL — ABNORMAL HIGH (ref 65–99)

## 2017-02-03 MED ORDER — CHLORHEXIDINE GLUCONATE 4 % EX LIQD
60.0000 mL | Freq: Once | CUTANEOUS | Status: AC
  Administered 2017-02-04: 4 via TOPICAL

## 2017-02-03 NOTE — Progress Notes (Signed)
Subjective:   Patient  Is doing fair No acute changes or c/o   Objective:  Vital signs in last 24 hours:  Temp:  [98.3 F (36.8 C)-99.1 F (37.3 C)] 98.3 F (36.8 C) (03/29 1302) Pulse Rate:  [77-81] 77 (03/29 1302) Resp:  [16-20] 16 (03/29 1302) BP: (162-178)/(83-93) 163/86 (03/29 1302) SpO2:  [93 %-100 %] 93 % (03/29 1302)  Weight change:  Filed Weights   01/31/17 1919 02/02/17 0930 02/02/17 1306  Weight: 99.7 kg (219 lb 14.4 oz) 101.5 kg (223 lb 12.8 oz) 99.8 kg (220 lb)    Intake/Output:    Intake/Output Summary (Last 24 hours) at 02/03/17 1357 Last data filed at 02/03/17 5374  Gross per 24 hour  Intake              361 ml  Output                0 ml  Net              361 ml     Physical Exam: General: No acute distress, laying in the bed   HEENT Anicteric, moist oral mucous membranes   Neck Supple   Pulm/lungs Normal breathing effort   CVS/Heart Regular rhythm, no rub or gallop   Abdomen:  Soft, nontender   Extremities: Left foot ulcer   Neurologic: Alert, oriented   Skin: No acute rashes   Access: AV fistula        Basic Metabolic Panel:   Recent Labs Lab 01/31/17 1600 02/01/17 0615 02/02/17 0935  NA 132* 133* 134*  K 4.0 4.3 4.3  CL 94* 95* 97*  CO2 28 28 26   GLUCOSE 182* 95 136*  BUN 37* 46* 68*  CREATININE 9.34* 11.49* 13.76*  CALCIUM 8.7* 8.0* 8.0*  PHOS  --   --  5.7*     CBC:  Recent Labs Lab 01/31/17 1600 02/01/17 0615 02/02/17 0935 02/03/17 0532  WBC 15.2* 20.1* 16.0* 9.7  NEUTROABS 13.3*  --   --   --   HGB 11.5* 9.5* 9.4* 10.7*  HCT 34.2* 28.6* 28.1* 31.4*  MCV 88.7 88.5 88.9 89.7  PLT 237 212 244 245      Microbiology:  Recent Results (from the past 720 hour(s))  Culture, blood (Routine x 2)     Status: None (Preliminary result)   Collection Time: 01/31/17  3:32 PM  Result Value Ref Range Status   Specimen Description BLOOD LEFT ANTECUBITAL  Final   Special Requests   Final    BOTTLES DRAWN AEROBIC AND  ANAEROBIC Blood Culture adequate volume   Culture NO GROWTH 3 DAYS  Final   Report Status PENDING  Incomplete  Culture, blood (Routine x 2)     Status: None (Preliminary result)   Collection Time: 01/31/17  3:32 PM  Result Value Ref Range Status   Specimen Description BLOOD LEFT FOREARM  Final   Special Requests   Final    BOTTLES DRAWN AEROBIC AND ANAEROBIC Blood Culture adequate volume   Culture NO GROWTH 3 DAYS  Final   Report Status PENDING  Incomplete  Aerobic/Anaerobic Culture (surgical/deep wound)     Status: Abnormal (Preliminary result)   Collection Time: 01/31/17  4:00 PM  Result Value Ref Range Status   Specimen Description ABSCESS  Final   Special Requests Immunocompromised  Final   Gram Stain   Final    MODERATE WBC PRESENT,BOTH PMN AND MONONUCLEAR ABUNDANT GRAM NEGATIVE RODS ABUNDANT GRAM POSITIVE COCCI IN  PAIRS RARE GRAM POSITIVE RODS    Culture (A)  Final    MULTIPLE ORGANISMS PRESENT, NONE PREDOMINANT HOLDING FOR POSSIBLE ANAEROBE Performed at [facility], [place] [address], [place], [place] [address]    Report Status PENDING  Incomplete  MRSA PCR Screening     Status: None   Collection Time: 02/01/17  8:26 AM  Result Value Ref Range Status   MRSA by PCR NEGATIVE NEGATIVE Final    Comment:        The GeneXpert MRSA Assay (FDA approved for NASAL specimens only), is one component of a comprehensive MRSA colonization surveillance program. It is not intended to diagnose MRSA infection nor to guide or monitor treatment for MRSA infections.     Coagulation Studies:  Recent Labs  01/31/17 1600  LABPROT 15.6*  INR 1.23    Urinalysis: No results for input(s): COLORURINE, LABSPEC, PHURINE, GLUCOSEU, HGBUR, BILIRUBINUR, KETONESUR, PROTEINUR, UROBILINOGEN, NITRITE, LEUKOCYTESUR in the last 72 hours.  Invalid input(s): APPERANCEUR    Imaging: Mr Foot Left Wo Contrast  Result Date: 02/01/2017 CLINICAL DATA:  Pt has a large wound on his left foot  that is apparently been present and gradually worsening for at least weeks if not months. EXAM: MRI OF THE LEFT FOOT WITHOUT CONTRAST TECHNIQUE: Multiplanar, multisequence MR imaging of the left forefoot was performed. No intravenous contrast was administered. COMPARISON:  None. FINDINGS: Bones/Joint/Cartilage Large soft tissue ulcer overlying the fifth MTP joint. Cortical destruction of the fifth metatarsal head with severe marrow edema most consistent with osteomyelitis. Cortical irregularity of the fifth proximal phalanx with severe edema concerning for osteomyelitis. No other marrow signal abnormality. No fracture or dislocation. Normal alignment. No joint effusion. Ligaments Collateral ligaments are intact.  Lisfranc ligament is intact. Muscles and Tendons Flexor, peroneal and extensor compartment tendons are intact. Soft tissue No fluid collection or hematoma.  No soft tissue mass. IMPRESSION: 1. Large soft tissue ulcer overlying the fifth MTP joint. Findings concerning for osteomyelitis of the fifth metatarsal head and possibly base of the fifth proximal phalanx. Electronically Signed   By: Kathreen Devoid   On: 02/01/2017 14:46     Medications:    . abacavir  300 mg Oral BID  . aspirin  81 mg Oral Daily  . calcium acetate  667 mg Oral TID WC  . carvedilol  25 mg Oral BID WC  . dolutegravir  50 mg Oral BID  . feeding supplement (PRO-STAT SUGAR FREE 64)  30 mL Oral BID  . heparin  5,000 Units Subcutaneous Q8H  . insulin aspart  0-5 Units Subcutaneous QHS  . insulin aspart  0-9 Units Subcutaneous TID WC  . lamiVUDine  25 mg Oral Daily  . losartan  100 mg Oral Daily  . minoxidil  2.5 mg Oral BID  . piperacillin-tazobactam (ZOSYN)  IV  3.375 g Intravenous Q12H  . sodium chloride flush  3 mL Intravenous Q12H   sodium chloride, sodium chloride, sodium chloride, acetaminophen **OR** acetaminophen, albuterol, alteplase, feeding supplement (NEPRO CARB STEADY), heparin, HYDROcodone-acetaminophen,  lidocaine (PF), lidocaine-prilocaine, ondansetron **OR** ondansetron (ZOFRAN) IV, senna-docusate, sodium chloride flush  Assessment/ Plan:  46 y.o.African American male with ESRD on HD since APR 2017, DM-2, HTN, HIV, was admitted on 01/31/2017 with Left foot diabetic ulcer and osteomyelitis  [facility]/ [address] [facility], MWF/ 4.5 hrs  1. ESRD 2. AOCKD 3. SHPTH 4. Left Diabetic foot ulcer and osteomyelitis  Plan: HD MWF while in hospital Monitor phos and Hgb  MRI confirms osteomyelitis  of foot.  Evaluated by podiatry (Dr Vickki Muff) and infectious disease. Recommended vancomycin and Zosyn Possibility of foot amputation this admission  Dialysis tomorrow        LOS: 3 Seydou Hearns 3/29/20181:57 PM

## 2017-02-03 NOTE — Progress Notes (Signed)
[facility] [place] NAME: Nathan Ayers    MR#:  [id number]  DATE OF BIRTH:  10-12-1971  SUBJECTIVE:   Patient here due to sepsis secondary to a left diabetic foot ulcer. MRI showing evidence of Osteomyelitis.  Going to OR on Friday as per Podiatry.   REVIEW OF SYSTEMS:    Review of Systems  Constitutional: Negative for chills and fever.  HENT: Negative for congestion and tinnitus.   Eyes: Negative for blurred vision and double vision.  Respiratory: Negative for cough, shortness of breath and wheezing.   Cardiovascular: Negative for chest pain, orthopnea and PND.  Gastrointestinal: Negative for abdominal pain, diarrhea, nausea and vomiting.  Genitourinary: Negative for dysuria and hematuria.  Neurological: Positive for weakness. Negative for dizziness, sensory change and focal weakness.  All other systems reviewed and are negative.   Nutrition: Renal/Carb diet Tolerating Diet: Yes Tolerating PT: Await Eval   DRUG ALLERGIES:  No Known Allergies  VITALS:  Blood pressure (!) 163/86, pulse 77, temperature 98.3 F (36.8 C), temperature source Oral, resp. rate 16, height 6' 6"  (1.981 m), weight 99.8 kg (220 lb), SpO2 93 %.  PHYSICAL EXAMINATION:   Physical Exam  GENERAL:  46 y.o.-year-old patient lying in bed in no acute distress.  EYES: Pupils equal, round, reactive to light and accommodation. No scleral icterus. Extraocular muscles intact.  HEENT: Head atraumatic, normocephalic. Oropharynx and nasopharynx clear.  NECK:  Supple, no jugular venous distention. No thyroid enlargement, no tenderness.  LUNGS: Normal breath sounds bilaterally, no wheezing, rales, rhonchi. No use of accessory muscles of respiration.  CARDIOVASCULAR: S1, S2 normal. No murmurs, rubs, or gallops.  ABDOMEN: Soft, nontender, nondistended. Bowel sounds present. No organomegaly or mass.  EXTREMITIES: No cyanosis, clubbing or edema b/l.    NEUROLOGIC: Cranial  nerves II through XII are intact. No focal Motor or sensory deficits b/l.   PSYCHIATRIC: The patient is alert and oriented x 3.  SKIN: No obvious rash, lesion, left diabetic foot ulcer w/ foul smelling drainage.    LABORATORY PANEL:   CBC  Recent Labs Lab 02/03/17 0532  WBC 9.7  HGB 10.7*  HCT 31.4*  PLT 245   ------------------------------------------------------------------------------------------------------------------  Chemistries   Recent Labs Lab 01/31/17 1600  02/02/17 0935  NA 132*  < > 134*  K 4.0  < > 4.3  CL 94*  < > 97*  CO2 28  < > 26  GLUCOSE 182*  < > 136*  BUN 37*  < > 68*  CREATININE 9.34*  < > 13.76*  CALCIUM 8.7*  < > 8.0*  AST 11*  --   --   ALT 10*  --   --   ALKPHOS 68  --   --   BILITOT 0.7  --   --   < > = values in this interval not displayed. ------------------------------------------------------------------------------------------------------------------  Cardiac Enzymes  Recent Labs Lab 01/31/17 1600  TROPONINI 0.03*   ------------------------------------------------------------------------------------------------------------------  RADIOLOGY:  No results found.   ASSESSMENT AND PLAN:   46 year old history of HIV, hypertension, diabetes, end-stage renal disease hemodialysis, diabetic neuropathy who presented to the hospital due to fever and a left foot ulcer with foul-smelling drainage.  1. Sepsis-patient met criteria on admission given leukocytosis, fever -Continue IV Zosyn - Wound cultures growing multiple organisms. currently afebrile.  2. Left foot ulcer with foul-smelling drainage- cause of patient's sepsis. -Continue IV Zosyn. Appreciate podiatry input MRI showing evidence of osteomyelitis in the fifth metatarsal head  and proximal base of the fifth proximal phalanx. Podiatry plans for surgical debridement tomorrow.  - likely long term abx but can be done at HD.   3. End Stage renal disease on hemodialysis-Nephrology  following. Cont. HD on MWF.   4. Hx of HIV - cont. HAART.   - no acute issue. Appreciate ID input and cont. Current care.    5. DM - cont. SSI.   - BS stable.   6. Secondary hyperparathyroidism-continue PhosLo.  7. Essential hypertension-continue losartan, minoxidil, Coreg.   All the records are reviewed and case discussed with Care Management/Social Worker. Management plans discussed with the patient, family and they are in agreement.  CODE STATUS: Full Code  DVT Prophylaxis: Hep. SQ  TOTAL TIME TAKING CARE OF THIS PATIENT: 25 minutes.   POSSIBLE D/C IN 2-3 DAYS, DEPENDING ON CLINICAL CONDITION.   Henreitta Leber M.D on 02/03/2017 at 2:36 PM  Between 7am to 6pm - Pager - [phone]  After 6pm go to [email or web address] - Technical [employer] [facility]  Office  [phone]  CC: Primary care physician; No PCP Per Patient

## 2017-02-03 NOTE — Progress Notes (Signed)
Pharmacy Antibiotic Note  Nathan Ayers is a 46 y.o. male admitted on 01/31/2017 with sepsis.  Pharmacy has been consulted for vancomycin and piperacillin/tazobactam dosing.  Patient has ESRD on HD.  Vancomycin d/c.  Plan: Day 4- Piperacillin/tazobactam 3.375 g IV q12h EI   Height: 6' 6"  (198.1 cm) Weight: 220 lb (99.8 kg) IBW/kg (Calculated) : 91.4  Temp (24hrs), Avg:98.7 F (37.1 C), Min:98.3 F (36.8 C), Max:99.1 F (37.3 C)   Recent Labs Lab 01/31/17 1600 02/01/17 0615 02/02/17 0935 02/03/17 0532  WBC 15.2* 20.1* 16.0* 9.7  CREATININE 9.34* 11.49* 13.76*  --   LATICACIDVEN 1.6  --   --   --     Estimated Creatinine Clearance: 8.9 mL/min (A) (by C-G formula based on SCr of 13.76 mg/dL (H)).    No Known Allergies  Antimicrobials this admission: Piperacillin/tazobactam 3/26 >>  vancomycin 3/26 >> 3/28  Dose adjustments this admission:  Microbiology results: 3/26 BCx: Sent 3/26 UCx: Sent   Thank you for allowing pharmacy to be a part of this patient's care.  Noralee Space, PharmD Clinical Pharmacist 02/03/2017 3:31 PM

## 2017-02-03 NOTE — Progress Notes (Signed)
Daily Progress Note   Subjective  - * No surgery date entered *  f/u left foot ulcer.  MRI consistent with osteomyelitis  Objective Vitals:   02/02/17 1308 02/02/17 1735 02/02/17 2008 02/03/17 0427  BP: (!) 178/96 (!) 171/88 (!) 176/93 (!) 162/83  Pulse: 79 81 81 80  Resp: 16  20 18   Temp:   99.1 F (37.3 C) 98.4 F (36.9 C)  TempSrc:   Oral Oral  SpO2: 97%  100% 100%  Weight:      Height:        Physical Exam: Ulcer present deep to bone.    MRI: Study Result   CLINICAL DATA:  Pt has a large wound on his left foot that is apparently been present and gradually worsening for at least weeks if not months.  EXAM: MRI OF THE LEFT FOOT WITHOUT CONTRAST  TECHNIQUE: Multiplanar, multisequence MR imaging of the left forefoot was performed. No intravenous contrast was administered.  COMPARISON:  None.  FINDINGS: Bones/Joint/Cartilage  Large soft tissue ulcer overlying the fifth MTP joint. Cortical destruction of the fifth metatarsal head with severe marrow edema most consistent with osteomyelitis. Cortical irregularity of the fifth proximal phalanx with severe edema concerning for osteomyelitis.  No other marrow signal abnormality. No fracture or dislocation. Normal alignment. No joint effusion.  Ligaments  Collateral ligaments are intact.  Lisfranc ligament is intact.  Muscles and Tendons Flexor, peroneal and extensor compartment tendons are intact.  Soft tissue No fluid collection or hematoma.  No soft tissue mass.  IMPRESSION: 1. Large soft tissue ulcer overlying the fifth MTP joint. Findings concerning for osteomyelitis of the fifth metatarsal head and possibly base of the fifth proximal phalanx.       Laboratory CBC    Component Value Date/Time   WBC 9.7 02/03/2017 0532   HGB 10.7 (L) 02/03/2017 0532   HCT 31.4 (L) 02/03/2017 0532   PLT 245 02/03/2017 0532    BMET    Component Value Date/Time   NA 134 (L) 02/02/2017 0935   K  4.3 02/02/2017 0935   CL 97 (L) 02/02/2017 0935   CO2 26 02/02/2017 0935   GLUCOSE 136 (H) 02/02/2017 0935   BUN 68 (H) 02/02/2017 0935   CREATININE 13.76 (H) 02/02/2017 0935   CREATININE 1.08 07/19/2013 1631   CALCIUM 8.0 (L) 02/02/2017 0935   GFRNONAA 4 (L) 02/02/2017 0935   GFRNONAA 85 07/19/2013 1631   GFRAA 4 (L) 02/02/2017 0935   GFRAA >89 07/19/2013 1631    Assessment/Planning: Osteomyelitisleft 5th metatarsal    Will plan for amputation 5th toe and joint tomorrow.  All r/b/a/c d/w pt  NPO tomorrow.   [name]  02/03/2017, 8:04 AM

## 2017-02-04 ENCOUNTER — Encounter
Admission: EM | Disposition: A | Discharge status: Home Health | Payer: Self-pay | Source: Home / Self Care | Attending: Internal Medicine

## 2017-02-04 ENCOUNTER — Inpatient Hospital Stay: Payer: Medicaid Other | Admitting: Anesthesiology

## 2017-02-04 ENCOUNTER — Encounter: Payer: Self-pay | Admitting: Anesthesiology

## 2017-02-04 HISTORY — PX: AMPUTATION TOE: SHX6595

## 2017-02-04 LAB — GLUCOSE, CAPILLARY
GLUCOSE-CAPILLARY: 112 mg/dL — AB (ref 65–99)
GLUCOSE-CAPILLARY: 118 mg/dL — AB (ref 65–99)
Glucose-Capillary: 112 mg/dL — ABNORMAL HIGH (ref 65–99)
Glucose-Capillary: 115 mg/dL — ABNORMAL HIGH (ref 65–99)
Glucose-Capillary: 109 mg/dL — ABNORMAL HIGH (ref 65–99)

## 2017-02-04 LAB — AEROBIC/ANAEROBIC CULTURE (SURGICAL/DEEP WOUND)

## 2017-02-04 LAB — AEROBIC/ANAEROBIC CULTURE W GRAM STAIN (SURGICAL/DEEP WOUND)

## 2017-02-04 SURGERY — AMPUTATION, TOE
Anesthesia: General | Site: Foot | Laterality: Left | Wound class: Dirty or Infected

## 2017-02-04 MED ORDER — LABETALOL HCL 5 MG/ML IV SOLN
INTRAVENOUS | Status: AC
  Administered 2017-02-04: 5 mg via INTRAVENOUS
  Filled 2017-02-04: qty 4

## 2017-02-04 MED ORDER — LIDOCAINE HCL 2 % EX GEL
CUTANEOUS | Status: AC
  Filled 2017-02-04: qty 5

## 2017-02-04 MED ORDER — MIDAZOLAM HCL 2 MG/2ML IJ SOLN
INTRAMUSCULAR | Status: AC
  Filled 2017-02-04: qty 2

## 2017-02-04 MED ORDER — LIDOCAINE HCL (PF) 1 % IJ SOLN
INTRAMUSCULAR | Status: DC | PRN
  Administered 2017-02-04: 5 mL

## 2017-02-04 MED ORDER — PHENYLEPHRINE HCL 10 MG/ML IJ SOLN
INTRAMUSCULAR | Status: DC | PRN
  Administered 2017-02-04 (×2): 100 ug via INTRAVENOUS

## 2017-02-04 MED ORDER — ONDANSETRON HCL 4 MG/2ML IJ SOLN: 4.0000 mg | Freq: Once | INTRAMUSCULAR | Status: DC | PRN

## 2017-02-04 MED ORDER — FENTANYL CITRATE (PF) 100 MCG/2ML IJ SOLN
INTRAMUSCULAR | Status: DC | PRN
  Administered 2017-02-04: 50 ug via INTRAVENOUS

## 2017-02-04 MED ORDER — LIDOCAINE HCL (PF) 1 % IJ SOLN
INTRAMUSCULAR | Status: AC
  Filled 2017-02-04: qty 30

## 2017-02-04 MED ORDER — LOPERAMIDE HCL 2 MG PO CAPS
2.0000 mg | ORAL_CAPSULE | ORAL | Status: DC | PRN
  Administered 2017-02-04: 2 mg via ORAL
  Filled 2017-02-04: qty 1

## 2017-02-04 MED ORDER — LABETALOL HCL 5 MG/ML IV SOLN
5.0000 mg | INTRAVENOUS | Status: AC | PRN
  Administered 2017-02-04 (×4): 5 mg via INTRAVENOUS

## 2017-02-04 MED ORDER — VANCOMYCIN HCL 1000 MG IV SOLR
INTRAVENOUS | Status: DC | PRN
  Administered 2017-02-04: 1000 mg

## 2017-02-04 MED ORDER — FENTANYL CITRATE (PF) 100 MCG/2ML IJ SOLN: 25.0000 ug | INTRAMUSCULAR | Status: DC | PRN

## 2017-02-04 MED ORDER — EPHEDRINE SULFATE 50 MG/ML IJ SOLN
INTRAMUSCULAR | Status: AC
  Filled 2017-02-04: qty 1

## 2017-02-04 MED ORDER — MORPHINE SULFATE (PF) 2 MG/ML IV SOLN: 2.0000 mg | INTRAVENOUS | Status: DC | PRN

## 2017-02-04 MED ORDER — PRO-STAT SUGAR FREE PO LIQD
30.0000 mL | Freq: Every day | ORAL | Status: DC
  Administered 2017-02-05 – 2017-02-08 (×4): 30 mL via ORAL

## 2017-02-04 MED ORDER — FENTANYL CITRATE (PF) 100 MCG/2ML IJ SOLN
INTRAMUSCULAR | Status: AC
  Filled 2017-02-04: qty 2

## 2017-02-04 MED ORDER — BUPIVACAINE-EPINEPHRINE 0.25% -1:200000 IJ SOLN
INTRAMUSCULAR | Status: DC | PRN
  Administered 2017-02-04: 3 mL

## 2017-02-04 MED ORDER — CARVEDILOL 25 MG PO TABS
ORAL_TABLET | ORAL | Status: AC
  Filled 2017-02-04: qty 1

## 2017-02-04 MED ORDER — RENA-VITE PO TABS
1.0000 | ORAL_TABLET | Freq: Every day | ORAL | Status: DC
  Administered 2017-02-04 – 2017-02-07 (×4): 1 via ORAL
  Filled 2017-02-04 (×4): qty 1

## 2017-02-04 MED ORDER — GLYCOPYRROLATE 0.2 MG/ML IJ SOLN
INTRAMUSCULAR | Status: DC | PRN
  Administered 2017-02-04: .2 mg via INTRAVENOUS

## 2017-02-04 MED ORDER — HYDRALAZINE HCL 20 MG/ML IJ SOLN
10.0000 mg | Freq: Once | INTRAMUSCULAR | Status: AC
  Administered 2017-02-04: 10 mg via INTRAVENOUS

## 2017-02-04 MED ORDER — BUPIVACAINE HCL 0.5 % IJ SOLN
INTRAMUSCULAR | Status: DC | PRN
  Administered 2017-02-04: 5 mL

## 2017-02-04 MED ORDER — LIDOCAINE HCL (CARDIAC) 20 MG/ML IV SOLN
INTRAVENOUS | Status: DC | PRN
  Administered 2017-02-04: 60 mg via INTRAVENOUS

## 2017-02-04 MED ORDER — NEPRO/CARBSTEADY PO LIQD
237.0000 mL | Freq: Every day | ORAL | Status: DC
  Administered 2017-02-07: 237 mL via ORAL

## 2017-02-04 MED ORDER — MIDAZOLAM HCL 2 MG/2ML IJ SOLN
INTRAMUSCULAR | Status: DC | PRN
  Administered 2017-02-04: 2 mg via INTRAVENOUS

## 2017-02-04 MED ORDER — PROPOFOL 10 MG/ML IV BOLUS
INTRAVENOUS | Status: AC
  Filled 2017-02-04: qty 20

## 2017-02-04 MED ORDER — HYDRALAZINE HCL 20 MG/ML IJ SOLN
INTRAMUSCULAR | Status: AC
Start: 2017-02-04 — End: 2017-02-05
  Filled 2017-02-04: qty 1

## 2017-02-04 MED ORDER — PROPOFOL 10 MG/ML IV BOLUS
INTRAVENOUS | Status: DC | PRN
  Administered 2017-02-04: 150 mg via INTRAVENOUS

## 2017-02-04 MED ORDER — GENTAMICIN SULFATE 40 MG/ML IJ SOLN
INTRAMUSCULAR | Status: AC
  Filled 2017-02-04: qty 4

## 2017-02-04 MED ORDER — BUPIVACAINE HCL (PF) 0.5 % IJ SOLN
INTRAMUSCULAR | Status: AC
  Filled 2017-02-04: qty 30

## 2017-02-04 MED ORDER — VANCOMYCIN HCL 1000 MG IV SOLR
INTRAVENOUS | Status: AC
  Filled 2017-02-04: qty 2000

## 2017-02-04 MED ORDER — CARVEDILOL 25 MG PO TABS: 25.0000 mg | ORAL_TABLET | Freq: Once | ORAL | Status: DC

## 2017-02-04 SURGICAL SUPPLY — 43 items
BANDAGE ELASTIC 4 LF NS (GAUZE/BANDAGES/DRESSINGS) ×2 IMPLANT
BANDAGE STRETCH 3X4.1 STRL (GAUZE/BANDAGES/DRESSINGS) ×4 IMPLANT
BLADE OSC/SAGITTAL 5.5X25 (BLADE) ×2 IMPLANT
BLADE OSC/SAGITTAL MD 5.5X18 (BLADE) ×2 IMPLANT
BLADE SURG MINI STRL (BLADE) ×2 IMPLANT
BNDG ESMARK 4X12 TAN STRL LF (GAUZE/BANDAGES/DRESSINGS) ×2 IMPLANT
BNDG GAUZE 4.5X4.1 6PLY STRL (MISCELLANEOUS) ×2 IMPLANT
CANISTER SUCT 1200ML W/VALVE (MISCELLANEOUS) ×2 IMPLANT
DRAPE FLUOR MINI C-ARM 54X84 (DRAPES) ×2 IMPLANT
DRAPE XRAY CASSETTE 23X24 (DRAPES) ×2 IMPLANT
DRSG MEPITEL 4X7.2 (GAUZE/BANDAGES/DRESSINGS) ×2 IMPLANT
DURAPREP 26ML APPLICATOR (WOUND CARE) ×2 IMPLANT
ELECT REM PT RETURN 9FT ADLT (ELECTROSURGICAL) ×2
ELECTRODE REM PT RTRN 9FT ADLT (ELECTROSURGICAL) ×1 IMPLANT
GAUZE PACKING IODOFORM 1/2 (PACKING) ×2 IMPLANT
GAUZE PETRO XEROFOAM 1X8 (MISCELLANEOUS) ×2 IMPLANT
GAUZE SPONGE 4X4 12PLY STRL (GAUZE/BANDAGES/DRESSINGS) ×2 IMPLANT
GAUZE STRETCH 2X75IN STRL (MISCELLANEOUS) ×2 IMPLANT
GLOVE BIO SURGEON STRL SZ7.5 (GLOVE) ×2 IMPLANT
GLOVE INDICATOR 8.0 STRL GRN (GLOVE) ×2 IMPLANT
GOWN STRL REUS W/ TWL LRG LVL3 (GOWN DISPOSABLE) ×2 IMPLANT
GOWN STRL REUS W/TWL LRG LVL3 (GOWN DISPOSABLE) ×2
HANDPIECE INTERPULSE COAX TIP (DISPOSABLE) ×1
KIT RM TURNOVER STRD PROC AR (KITS) ×2 IMPLANT
KIT STIMULAN RAPID CURE 5CC (Orthopedic Implant) ×2 IMPLANT
LABEL OR SOLS (LABEL) ×2 IMPLANT
NEEDLE FILTER BLUNT 18X 1/2SAF (NEEDLE) ×1
NEEDLE FILTER BLUNT 18X1 1/2 (NEEDLE) ×1 IMPLANT
NEEDLE HYPO 25X1 1.5 SAFETY (NEEDLE) ×2 IMPLANT
NS IRRIG 500ML POUR BTL (IV SOLUTION) ×2 IMPLANT
PACK EXTREMITY ARMC (MISCELLANEOUS) ×2 IMPLANT
PAD ABD DERMACEA PRESS 5X9 (GAUZE/BANDAGES/DRESSINGS) ×4 IMPLANT
SET HNDPC FAN SPRY TIP SCT (DISPOSABLE) ×1 IMPLANT
SOL .9 NS 3000ML IRR  AL (IV SOLUTION) ×1
SOL .9 NS 3000ML IRR UROMATIC (IV SOLUTION) ×1 IMPLANT
STAPLER SKIN PROX 35W (STAPLE) ×2 IMPLANT
STOCKINETTE M/LG 89821 (MISCELLANEOUS) ×2 IMPLANT
STRAP SAFETY BODY (MISCELLANEOUS) ×2 IMPLANT
SUT ETHILON 3-0 FS-10 30 BLK (SUTURE) ×2
SUT ETHILON 5-0 FS-2 18 BLK (SUTURE) ×2 IMPLANT
SUT VIC AB 4-0 FS2 27 (SUTURE) ×2 IMPLANT
SUTURE EHLN 3-0 FS-10 30 BLK (SUTURE) ×1 IMPLANT
SYRINGE 10CC LL (SYRINGE) ×6 IMPLANT

## 2017-02-04 NOTE — Progress Notes (Signed)
Pharmacy Antibiotic Note  Nathan Ayers is a 46 y.o. male admitted on 01/31/2017 with sepsis.  Pharmacy has been consulted for vancomycin and piperacillin/tazobactam dosing.  Patient has ESRD on HD.  This is day #5 of piperacillin/tazobactam.  Plan: Continue Piperacillin/tazobactam 3.375 g IV q12h EI   Height: 6' 6"  (198.1 cm) Weight: 220 lb (99.8 kg) IBW/kg (Calculated) : 91.4  Temp (24hrs), Avg:98.4 F (36.9 C), Min:98.3 F (36.8 C), Max:98.5 F (36.9 C)   Recent Labs Lab 01/31/17 1600 02/01/17 0615 02/02/17 0935 02/03/17 0532  WBC 15.2* 20.1* 16.0* 9.7  CREATININE 9.34* 11.49* 13.76*  --   LATICACIDVEN 1.6  --   --   --     Estimated Creatinine Clearance: 8.9 mL/min (A) (by C-G formula based on SCr of 13.76 mg/dL (H)).    No Known Allergies  Antimicrobials this admission: Piperacillin/tazobactam 3/26 >>  vancomycin 3/26 >> 3/28  Dose adjustments this admission:  Microbiology results: 3/26 BCx: No growth 4 days  Thank you for allowing pharmacy to be a part of this patient's care.  Lenis Noon, PharmD Clinical Pharmacist 02/04/2017 10:21 AM

## 2017-02-04 NOTE — Progress Notes (Signed)
Nutrition Follow-up  DOCUMENTATION CODES:   Non-severe (moderate) malnutrition in context of chronic illness  INTERVENTION:  -Continues snacks between meals -Continue Prostat daily -add Nepro daily (continue prn order for missed meal due to dialysis) -Continue to encourage protein intake -Add renal MVI  NUTRITION DIAGNOSIS:   Malnutrition related to chronic illness as evidenced by mild depletion of muscle mass, energy intake < 75% for > or equal to 1 month.  Improving as pt eating 100% of meals, taking snacks and supplements  GOAL:   Patient will meet greater than or equal to 90% of their needs  Progressing  MONITOR:   PO intake, Supplement acceptance, Labs, Weight trends  REASON FOR ASSESSMENT:   Malnutrition Screening Tool    ASSESSMENT:   46 yo male admitted with fever, chills, N/V with sepsis due to left foot ulcer infection. Pt with hx of HIV, ESRD on HD, HTN, DM.  Pt with osteomyelitis of left 5th metatarsal with plan for amputation of 5th toe and joint today; plan for scheduled dialysis today as well; last HD 3/28 with net UF 1.5 L. Weight 219 pounds on 3/26, 4 pound (1.8 kg) intradialytic weight gain between 3/26 and 3/28 which is acceptable  Recorded po intake 100% of meals; pt reports good appetite and that he is eating well at meal times. Pt also reports she is receiving his snacks between meals and eating these as well. Pt does not like the Prostat but is taking BID. Pt has Nepro ordered prn for missed meal during dialysis but reports he has not had a Nepro since admission  Labs: phosphorus 5.7 (H), corrected calcium 9.0 (albumin 2.7) Meds: phoslo   Diet Order:  Diet NPO time specified Except for: Sips with Meds  Skin:  Wound (see comment) (left infected foot ulceration)  Last BM:  01/28/17  Height:   Ht Readings from Last 1 Encounters:  01/31/17 6' 6"  (1.981 m)    Weight:   Wt Readings from Last 1 Encounters:  02/02/17 220 lb (99.8 kg)   Filed  Weights   01/31/17 1919 02/02/17 0930 02/02/17 1306  Weight: 219 lb 14.4 oz (99.7 kg) 223 lb 12.8 oz (101.5 kg) 220 lb (99.8 kg)    BMI:  Body mass index is 25.42 kg/m.  Estimated Nutritional Needs:   Kcal:  2500-3000 kcals  Protein:  125-150 g  Fluid:  1000 plus UOP  EDUCATION NEEDS:   Education needs addressed  Kerman Passey [place], [place], LDN [phone] Pager  [phone] Weekend/On-Call Pager

## 2017-02-04 NOTE — Transfer of Care (Signed)
Immediate Anesthesia Transfer of Care Note  Patient: Nathan Ayers  Procedure(s) Performed: Procedure(s): AMPUTATION 5TH METATARSAL AND JOINT (Left)  Patient Location: PACU  Anesthesia Type:General  Level of Consciousness: awake  Airway & Oxygen Therapy: Patient Spontanous Breathing and Patient connected to face mask oxygen  Post-op Assessment: Report given to RN and Post -op Vital signs reviewed and stable  Post vital signs: Reviewed and stable  Last Vitals:  Vitals:   02/04/17 1220 02/04/17 1431  BP: (!) 182/98 (!) 144/91  Pulse: 86 78  Resp: 20 (!) 0  Temp: 36.9 C 36.7 C    Last Pain:  Vitals:   02/04/17 1220  TempSrc: Tympanic  PainSc: 0-No pain         Complications: No apparent anesthesia complications

## 2017-02-04 NOTE — Progress Notes (Signed)
[facility] [place] NAME: Nathan Ayers    MR#:  [id number]  DATE OF BIRTH:  11-22-1970  SUBJECTIVE:   Patient here due to sepsis secondary to a left diabetic foot ulcer. MRI showing evidence of Osteomyelitis.   Going to OR today.  REVIEW OF SYSTEMS:    Review of Systems  Constitutional: Negative for chills and fever.  HENT: Negative for congestion and tinnitus.   Eyes: Negative for blurred vision and double vision.  Respiratory: Negative for cough, shortness of breath and wheezing.   Cardiovascular: Negative for chest pain, orthopnea and PND.  Gastrointestinal: Negative for abdominal pain, diarrhea, nausea and vomiting.  Genitourinary: Negative for dysuria and hematuria.  Neurological: Positive for weakness. Negative for dizziness, sensory change and focal weakness.  All other systems reviewed and are negative.  Nutrition: Renal/Carb diet Tolerating Diet: Yes Tolerating PT: Await Eval  DRUG ALLERGIES:  No Known Allergies  VITALS:  Blood pressure (!) 182/98, pulse 86, temperature 98.4 F (36.9 C), temperature source Tympanic, resp. rate 20, height 6' 6"  (1.981 m), weight 99.8 kg (220 lb), SpO2 100 %.  PHYSICAL EXAMINATION:   Physical Exam  GENERAL:  46 y.o.-year-old patient lying in bed in no acute distress.  EYES: Pupils equal, round, reactive to light and accommodation. No scleral icterus. Extraocular muscles intact.  HEENT: Head atraumatic, normocephalic. Oropharynx and nasopharynx clear.  NECK:  Supple, no jugular venous distention. No thyroid enlargement, no tenderness.  LUNGS: Normal breath sounds bilaterally, no wheezing, rales, rhonchi. No use of accessory muscles of respiration.  CARDIOVASCULAR: S1, S2 normal. No murmurs, rubs, or gallops.  ABDOMEN: Soft, nontender, nondistended. Bowel sounds present. No organomegaly or mass.  EXTREMITIES: No cyanosis, clubbing or edema b/l.    NEUROLOGIC: Cranial nerves II through XII  are intact. No focal Motor or sensory deficits b/l.   PSYCHIATRIC: The patient is alert and oriented x 3.  SKIN: No obvious rash, lesion, left diabetic foot ulcer w/ foul smelling drainage.   LABORATORY PANEL:   CBC  Recent Labs Lab 02/03/17 0532  WBC 9.7  HGB 10.7*  HCT 31.4*  PLT 245   ---------------------------------------------------------------------------------------------------------  Chemistries   Recent Labs Lab 01/31/17 1600  02/02/17 0935  NA 132*  < > 134*  K 4.0  < > 4.3  CL 94*  < > 97*  CO2 28  < > 26  GLUCOSE 182*  < > 136*  BUN 37*  < > 68*  CREATININE 9.34*  < > 13.76*  CALCIUM 8.7*  < > 8.0*  AST 11*  --   --   ALT 10*  --   --   ALKPHOS 68  --   --   BILITOT 0.7  --   --   < > = values in this interval not displayed. ---------------------------------------------------------------------------------------------------------  Cardiac Enzymes  Recent Labs Lab 01/31/17 1600  TROPONINI 0.03*   ------------------------------------------------------------------------------------------------------------------  RADIOLOGY:  No results found.   ASSESSMENT AND PLAN:   46 year old history of HIV, hypertension, diabetes, end-stage renal disease hemodialysis, diabetic neuropathy who presented to the hospital due to fever and a left foot ulcer with foul-smelling drainage.  1. Sepsis- POA and now resolved -Continue IV Zosyn - Wound cultures growing multiple organisms. currently afebrile.  2. Left foot ulcer with foul-smelling drainage- cause of patient's sepsis. -Continue IV Zosyn. Appreciate podiatry input MRI showing evidence of osteomyelitis in the fifth metatarsal head and proximal base of the fifth proximal phalanx. surgical debridement  today. - likely long term abx but can be done at HD. Wait for intra op cultures.  3. End Stage renal disease on hemodialysis-Nephrology following. Cont. HD on MWF.   4. Hx of HIV - cont. HAART.   - No acute  issue. Appreciate ID input.  5. DM - cont. SSI.   - BS stable.   6. Secondary hyperparathyroidism-continue PhosLo.  7. Essential hypertension-continue losartan, minoxidil, Coreg.  All the records are reviewed and case discussed with Care Management/Social Worker. Management plans discussed with the patient, family and they are in agreement.  CODE STATUS: Full Code  DVT Prophylaxis: Heparin SQ  TOTAL TIME TAKING CARE OF THIS PATIENT: 30 minutes.   POSSIBLE D/C IN 2-3 DAYS, DEPENDING ON CLINICAL CONDITION.  Hillary Bow R M.D on 02/04/2017 at 1:25 PM  Between 7am to 6pm - Pager - [phone]  After 6pm go to [email or web address] - Technical [employer] [facility]  Office  [phone]  CC: Primary care physician; No PCP Per Patient

## 2017-02-04 NOTE — Anesthesia Procedure Notes (Signed)
Procedure Name: LMA Insertion Date/Time: 02/04/2017 1:32 PM Performed by: Kannen Moxey Pre-anesthesia Checklist: Patient identified, Emergency Drugs available, Suction available, Patient being monitored and Timeout performed Patient Re-evaluated:Patient Re-evaluated prior to inductionOxygen Delivery Method: Circle system utilized Preoxygenation: Pre-oxygenation with 100% oxygen Intubation Type: IV induction Ventilation: Mask ventilation without difficulty LMA: LMA inserted LMA Size: 5.0 Number of attempts: 1 Placement Confirmation: ETT inserted through vocal cords under direct vision,  positive ETCO2 and breath sounds checked- equal and bilateral Tube secured with: Tape Dental Injury: Teeth and Oropharynx as per pre-operative assessment

## 2017-02-04 NOTE — Op Note (Signed)
Operative note   Surgeon:Benjamin Merrihew    Assistant:none    Preop diagnosis: Osteomyelitis left fifth ray    Postop diagnosis: Same    Procedure: Partial fifth ray amputation left foot    EBLMinimal    Anesthesia:local and general    HemMid calf tourniquet inflated to 200 mmHg for approximately 40 minutes    Specimen:  Partial fifth ray amputation for specimen and pathology    Complications: None    Operative indications:Sy A Lingafelter is an 46 y.o. that presents today for surgical intervention.  The risks/benefits/alternatives/complications have been discussed and consent has been given.    Procedure:  Patient was brought into the OR and placed on the operating table in thesupine position. After anesthesia was obtained theleft lower extremity was prepped and draped in usual sterile fashion.   after inflation of the tourniquet attention was directed to the dorsal aspect of the fifth metatarsal where a longitudinal incision was performed. Full-thickness incision was taken down to the bone. The toe was disarticulated and the fifth metatarsal midshaft area was exposed. An osteotomy was created in the residual bone was removed. A deep wound culture was performed from the bone. The remaining tissue was sent for pathological examination. All areas were flushed copiously. The wound was then excised of all necrotic infected soft tissue. The dorsal incision was closed with a 3-0 nylon. The plantar ulceration was reapproximated with a 3-0 nylon. It was packed with antibiotic vancomycin impregnated calcium sulfate beads. A small opening was left for drainage. This was then covered with a Mepitel. Bulky sterile dressing was then applied to the left foot.    Patient tolerated the procedure and anesthesia well.  Was transported from the OR to the PACU with all vital signs stable and vascular status intact. To be discharged per routine protocol.  Will follow up in approximately 1 week in the outpatient  clinic.

## 2017-02-04 NOTE — Anesthesia Post-op Follow-up Note (Cosign Needed)
Anesthesia QCDR form completed.        

## 2017-02-04 NOTE — Progress Notes (Signed)
Subjective:   Patient  Is doing fair No acute changes or c/o   Objective:  Vital signs in last 24 hours:  Temp:  [98 F (36.7 C)-98.5 F (36.9 C)] 98.1 F (36.7 C) (03/30 1609) Pulse Rate:  [76-86] 81 (03/30 1609) Resp:  [10-20] 16 (03/30 1609) BP: (138-197)/(72-106) 152/72 (03/30 1609) SpO2:  [97 %-100 %] 99 % (03/30 1609) Weight:  [99.8 kg (220 lb)] 99.8 kg (220 lb) (03/30 1236)  Weight change:  Filed Weights   02/02/17 0930 02/02/17 1306 02/04/17 1236  Weight: 101.5 kg (223 lb 12.8 oz) 99.8 kg (220 lb) 99.8 kg (220 lb)    Intake/Output:    Intake/Output Summary (Last 24 hours) at 02/04/17 1653 Last data filed at 02/04/17 1418  Gross per 24 hour  Intake              847 ml  Output                2 ml  Net              845 ml     Physical Exam: General: No acute distress, laying in the bed   HEENT Anicteric, moist oral mucous membranes   Neck Supple   Pulm/lungs Normal breathing effort   CVS/Heart Regular rhythm, no rub or gallop   Abdomen:  Soft, nontender   Extremities: Left foot ulcer   Neurologic: Alert, oriented   Skin: No acute rashes   Access: AV fistula        Basic Metabolic Panel:   Recent Labs Lab 01/31/17 1600 02/01/17 0615 02/02/17 0935  NA 132* 133* 134*  K 4.0 4.3 4.3  CL 94* 95* 97*  CO2 28 28 26   GLUCOSE 182* 95 136*  BUN 37* 46* 68*  CREATININE 9.34* 11.49* 13.76*  CALCIUM 8.7* 8.0* 8.0*  PHOS  --   --  5.7*     CBC:  Recent Labs Lab 01/31/17 1600 02/01/17 0615 02/02/17 0935 02/03/17 0532  WBC 15.2* 20.1* 16.0* 9.7  NEUTROABS 13.3*  --   --   --   HGB 11.5* 9.5* 9.4* 10.7*  HCT 34.2* 28.6* 28.1* 31.4*  MCV 88.7 88.5 88.9 89.7  PLT 237 212 244 245      Microbiology:  Recent Results (from the past 720 hour(s))  Culture, blood (Routine x 2)     Status: None (Preliminary result)   Collection Time: 01/31/17  3:32 PM  Result Value Ref Range Status   Specimen Description BLOOD LEFT ANTECUBITAL  Final    Special Requests   Final    BOTTLES DRAWN AEROBIC AND ANAEROBIC Blood Culture adequate volume   Culture NO GROWTH 4 DAYS  Final   Report Status PENDING  Incomplete  Culture, blood (Routine x 2)     Status: None (Preliminary result)   Collection Time: 01/31/17  3:32 PM  Result Value Ref Range Status   Specimen Description BLOOD LEFT FOREARM  Final   Special Requests   Final    BOTTLES DRAWN AEROBIC AND ANAEROBIC Blood Culture adequate volume   Culture NO GROWTH 4 DAYS  Final   Report Status PENDING  Incomplete  Aerobic/Anaerobic Culture (surgical/deep wound)     Status: Abnormal   Collection Time: 01/31/17  4:00 PM  Result Value Ref Range Status   Specimen Description ABSCESS  Final   Special Requests Immunocompromised  Final   Gram Stain   Final    MODERATE WBC PRESENT,BOTH PMN AND MONONUCLEAR ABUNDANT  GRAM NEGATIVE RODS ABUNDANT GRAM POSITIVE COCCI IN PAIRS RARE GRAM POSITIVE RODS Performed at [facility], [place] [address], [place], [place] [address]    Culture (A)  Final    MULTIPLE ORGANISMS PRESENT, NONE PREDOMINANT MIXED ANAEROBIC FLORA PRESENT.  CALL LAB IF FURTHER IID REQUIRED.    Report Status 02/04/2017 FINAL  Final  MRSA PCR Screening     Status: None   Collection Time: 02/01/17  8:26 AM  Result Value Ref Range Status   MRSA by PCR NEGATIVE NEGATIVE Final    Comment:        The GeneXpert MRSA Assay (FDA approved for NASAL specimens only), is one component of a comprehensive MRSA colonization surveillance program. It is not intended to diagnose MRSA infection nor to guide or monitor treatment for MRSA infections.     Coagulation Studies: No results for input(s): LABPROT, INR in the last 72 hours.  Urinalysis: No results for input(s): COLORURINE, LABSPEC, PHURINE, GLUCOSEU, HGBUR, BILIRUBINUR, KETONESUR, PROTEINUR, UROBILINOGEN, NITRITE, LEUKOCYTESUR in the last 72 hours.  Invalid input(s): APPERANCEUR    Imaging: No results  found.   Medications:    . abacavir  300 mg Oral BID  . aspirin  81 mg Oral Daily  . calcium acetate  667 mg Oral TID WC  . carvedilol      . carvedilol  25 mg Oral BID WC  . carvedilol  25 mg Oral Once  . dolutegravir  50 mg Oral BID  . feeding supplement (NEPRO CARB STEADY)  237 mL Oral Q1400  . [START ON 02/05/2017] feeding supplement (PRO-STAT SUGAR FREE 64)  30 mL Oral Daily  . heparin  5,000 Units Subcutaneous Q8H  . insulin aspart  0-5 Units Subcutaneous QHS  . insulin aspart  0-9 Units Subcutaneous TID WC  . lamiVUDine  25 mg Oral Daily  . losartan  100 mg Oral Daily  . minoxidil  2.5 mg Oral BID  . multivitamin  1 tablet Oral QHS  . piperacillin-tazobactam (ZOSYN)  IV  3.375 g Intravenous Q12H  . sodium chloride flush  3 mL Intravenous Q12H   sodium chloride, sodium chloride, sodium chloride, acetaminophen **OR** acetaminophen, albuterol, alteplase, feeding supplement (NEPRO CARB STEADY), heparin, HYDROcodone-acetaminophen, lidocaine (PF), lidocaine-prilocaine, loperamide, morphine injection, ondansetron **OR** ondansetron (ZOFRAN) IV, senna-docusate, sodium chloride flush  Assessment/ Plan:  46 y.o.African American male with ESRD on HD since APR 2017, DM-2, HTN, HIV, was admitted on 01/31/2017 with Left foot diabetic ulcer and osteomyelitis  [facility]/ [address] [facility], MWF/ 4.5 hrs  1. ESRD 2. AOCKD 3. SHPTH 4. Left Diabetic foot ulcer and osteomyelitis  Plan: HD MWF while in hospital- with surgery today postpone HD to saturday Monitor phos and Hgb  MRI confirms osteomyelitis of foot.  Evaluated by podiatry (Dr Vickki Muff) and infectious disease. Recommended vancomycin and Zosyn            LOS: 4 Dashanna Kinnamon 3/30/20184:53 PM

## 2017-02-04 NOTE — Anesthesia Preprocedure Evaluation (Signed)
Anesthesia Evaluation  Patient identified by MRN, date of birth, ID band Patient awake    Reviewed: Allergy & Precautions, NPO status , Patient's Chart, lab work & pertinent test results, reviewed documented beta blocker date and time   Airway Mallampati: II  TM Distance: >3 FB     Dental  (+) Chipped   Pulmonary           Cardiovascular hypertension, Pt. on medications and Pt. on home beta blockers      Neuro/Psych    GI/Hepatic   Endo/Other  diabetes, Type 2  Renal/GU Renal disease     Musculoskeletal   Abdominal   Peds  Hematology   Anesthesia Other Findings HIV. Hypertensive this am.  Reproductive/Obstetrics                             Anesthesia Physical Anesthesia Plan  ASA: III  Anesthesia Plan: General   Post-op Pain Management:    Induction: Intravenous  Airway Management Planned: LMA  Additional Equipment:   Intra-op Plan:   Post-operative Plan:   Informed Consent: I have reviewed the patients History and Physical, chart, labs and discussed the procedure including the risks, benefits and alternatives for the proposed anesthesia with the patient or authorized representative who has indicated his/her understanding and acceptance.     Plan Discussed with: CRNA  Anesthesia Plan Comments:         Anesthesia Quick Evaluation

## 2017-02-04 NOTE — OR Nursing (Signed)
Patient arrived in sds for surgery, warm to touch but temperature is 98.4.  Patient quiet and withdrawn. Unwilling to ask for assistance for current medical situation. Very tearful when questioned about his needs.  States he can take care of himself. Foot remains with foul odor. No visible drainage on dressing. Toe nails are long and curved with growth underneath. He states he has not had them clipped in a long time.  Is there a possibility of having this done while inpatient??

## 2017-02-04 NOTE — H&P (Signed)
HISTORY AND PHYSICAL INTERVAL NOTE:  02/04/2017  12:58 PM  Nathan Ayers  has presented today for surgery, with the diagnosis of osteomyelitis.  The various methods of treatment have been discussed with the patient.  No guarantees were given.  After consideration of risks, benefits and other options for treatment, the patient has consented to surgery.  I have reviewed the patients' chart and labs.    Patient Vitals for the past 24 hrs:  BP Temp Temp src Pulse Resp SpO2 Height Weight  02/04/17 1236 - - - - - - 6' 6"  (1.981 m) 99.8 kg (220 lb)  02/04/17 1220 (!) 182/98 98.4 F (36.9 C) Tympanic 86 20 100 % - -  02/04/17 0522 (!) 160/81 98.4 F (36.9 C) Oral 79 18 97 % - -  02/03/17 2015 138/72 98.5 F (36.9 C) Oral 78 18 100 % - -  02/03/17 1302 (!) 163/86 98.3 F (36.8 C) Oral 77 16 93 % - -      The patient was reexamined.  There have been no changes to his history and physical examination.  [name]

## 2017-02-05 ENCOUNTER — Encounter: Payer: Self-pay | Admitting: Podiatry

## 2017-02-05 LAB — CULTURE, BLOOD (ROUTINE X 2)
Culture: NO GROWTH
Culture: NO GROWTH
SPECIAL REQUESTS: ADEQUATE
SPECIAL REQUESTS: ADEQUATE

## 2017-02-05 LAB — GLUCOSE, CAPILLARY
GLUCOSE-CAPILLARY: 90 mg/dL (ref 65–99)
GLUCOSE-CAPILLARY: 88 mg/dL (ref 65–99)
GLUCOSE-CAPILLARY: 117 mg/dL — AB (ref 65–99)

## 2017-02-05 MED ORDER — EPOETIN ALFA 10000 UNIT/ML IJ SOLN: 4000.0000 [IU] | INTRAMUSCULAR | Status: DC

## 2017-02-05 MED ORDER — EPOETIN ALFA 10000 UNIT/ML IJ SOLN
4000.0000 [IU] | INTRAMUSCULAR | Status: DC
  Administered 2017-02-05 – 2017-02-07 (×2): 4000 [IU] via INTRAVENOUS
  Filled 2017-02-05: qty 1

## 2017-02-05 NOTE — Progress Notes (Signed)
RN called to room by patient saying he was bleeding. Patient found foot wound/dressing to be saturated and dripping blood into floor. Patient put back in bed and left extremity elevated. Dressing removed and Dr. Vickki Muff notified. MD order to apply large, bulky, tight pressure dressing. Dressing applied with help of charge RN. Patient laying comfortable in bed. Patient transported to dialysis without complication. Carmell Austria, RN in dialysis made aware to watch foot for bleeding.

## 2017-02-05 NOTE — Progress Notes (Signed)
Pre hd info

## 2017-02-05 NOTE — Progress Notes (Signed)
  End of hd

## 2017-02-05 NOTE — Progress Notes (Signed)
Subjective:   Patient  Is doing fair No acute changes or c/o Report that after he worked with PT there was some bleeding from foot wound   HEMODIALYSIS FLOWSHEET:  Blood Flow Rate (mL/min): 400 mL/min Arterial Pressure (mmHg): -150 mmHg Venous Pressure (mmHg): 150 mmHg Transmembrane Pressure (mmHg): 50 mmHg Ultrafiltration Rate (mL/min): 570 mL/min Dialysate Flow Rate (mL/min): 800 ml/min Conductivity: Machine : 14 Conductivity: Machine : 14 Dialysis Fluid Bolus: Normal Saline Bolus Amount (mL): 250 mL (prime) Dialysate Change:  (3k) Intra-Hemodialysis Comments: 558m (resting with eyes open)     Objective:  Vital signs in last 24 hours:  Temp:  [97.4 F (36.3 C)-99.6 F (37.6 C)] 99 F (37.2 C) (03/31 1024) Pulse Rate:  [76-88] 80 (03/31 1130) Resp:  [10-20] 18 (03/31 1105) BP: (115-197)/(61-106) 149/90 (03/31 1130) SpO2:  [97 %-100 %] 99 % (03/31 1130) Weight:  [99.8 kg (220 lb)-100 kg (220 lb 7.4 oz)] 100 kg (220 lb 7.4 oz) (03/31 1024)  Weight change:  Filed Weights   02/02/17 1306 02/04/17 1236 02/05/17 1024  Weight: 99.8 kg (220 lb) 99.8 kg (220 lb) 100 kg (220 lb 7.4 oz)    Intake/Output:    Intake/Output Summary (Last 24 hours) at 02/05/17 1156 Last data filed at 02/05/17 02725 Gross per 24 hour  Intake              590 ml  Output                2 ml  Net              588 ml     Physical Exam: General: No acute distress, laying in the bed   HEENT Anicteric, moist oral mucous membranes   Neck Supple   Pulm/lungs Normal breathing effort   CVS/Heart Regular rhythm, no rub or gallop   Abdomen:  Soft, nontender   Extremities: Left foot ulcer   Neurologic: Alert, oriented   Skin: No acute rashes   Access: AV fistula        Basic Metabolic Panel:   Recent Labs Lab 01/31/17 1600 02/01/17 0615 02/02/17 0935  NA 132* 133* 134*  K 4.0 4.3 4.3  CL 94* 95* 97*  CO2 28 28 26   GLUCOSE 182* 95 136*  BUN 37* 46* 68*  CREATININE 9.34* 11.49*  13.76*  CALCIUM 8.7* 8.0* 8.0*  PHOS  --   --  5.7*     CBC:  Recent Labs Lab 01/31/17 1600 02/01/17 0615 02/02/17 0935 02/03/17 0532  WBC 15.2* 20.1* 16.0* 9.7  NEUTROABS 13.3*  --   --   --   HGB 11.5* 9.5* 9.4* 10.7*  HCT 34.2* 28.6* 28.1* 31.4*  MCV 88.7 88.5 88.9 89.7  PLT 237 212 244 245      Microbiology:  Recent Results (from the past 720 hour(s))  Culture, blood (Routine x 2)     Status: None   Collection Time: 01/31/17  3:32 PM  Result Value Ref Range Status   Specimen Description BLOOD LEFT ANTECUBITAL  Final   Special Requests   Final    BOTTLES DRAWN AEROBIC AND ANAEROBIC Blood Culture adequate volume   Culture NO GROWTH 5 DAYS  Final   Report Status 02/05/2017 FINAL  Final  Culture, blood (Routine x 2)     Status: None   Collection Time: 01/31/17  3:32 PM  Result Value Ref Range Status   Specimen Description BLOOD LEFT FOREARM  Final   Special Requests  Final    BOTTLES DRAWN AEROBIC AND ANAEROBIC Blood Culture adequate volume   Culture NO GROWTH 5 DAYS  Final   Report Status 02/05/2017 FINAL  Final  Aerobic/Anaerobic Culture (surgical/deep wound)     Status: Abnormal   Collection Time: 01/31/17  4:00 PM  Result Value Ref Range Status   Specimen Description ABSCESS  Final   Special Requests Immunocompromised  Final   Gram Stain   Final    MODERATE WBC PRESENT,BOTH PMN AND MONONUCLEAR ABUNDANT GRAM NEGATIVE RODS ABUNDANT GRAM POSITIVE COCCI IN PAIRS RARE GRAM POSITIVE RODS Performed at [facility], [address], [place], [place] [address]    Culture (A)  Final    MULTIPLE ORGANISMS PRESENT, NONE PREDOMINANT MIXED ANAEROBIC FLORA PRESENT.  CALL LAB IF FURTHER IID REQUIRED.    Report Status 02/04/2017 FINAL  Final  MRSA PCR Screening     Status: None   Collection Time: 02/01/17  8:26 AM  Result Value Ref Range Status   MRSA by PCR NEGATIVE NEGATIVE Final    Comment:        The GeneXpert MRSA Assay (FDA approved for NASAL  specimens only), is one component of a comprehensive MRSA colonization surveillance program. It is not intended to diagnose MRSA infection nor to guide or monitor treatment for MRSA infections.   Aerobic/Anaerobic Culture (surgical/deep wound)     Status: None (Preliminary result)   Collection Time: 02/04/17  1:22 PM  Result Value Ref Range Status   Specimen Description TOE TOE  Final   Special Requests NONE  Final   Gram Stain   Final    FEW WBC PRESENT, PREDOMINANTLY PMN RARE GRAM POSITIVE COCCI IN PAIRS Performed at [facility], [address], [place], [place] [address]    Culture PENDING  Incomplete   Report Status PENDING  Incomplete    Coagulation Studies: No results for input(s): LABPROT, INR in the last 72 hours.  Urinalysis: No results for input(s): COLORURINE, LABSPEC, PHURINE, GLUCOSEU, HGBUR, BILIRUBINUR, KETONESUR, PROTEINUR, UROBILINOGEN, NITRITE, LEUKOCYTESUR in the last 72 hours.  Invalid input(s): APPERANCEUR    Imaging: No results found.   Medications:    . abacavir  300 mg Oral BID  . aspirin  81 mg Oral Daily  . calcium acetate  667 mg Oral TID WC  . carvedilol  25 mg Oral BID WC  . carvedilol  25 mg Oral Once  . dolutegravir  50 mg Oral BID  . feeding supplement (NEPRO CARB STEADY)  237 mL Oral Q1400  . feeding supplement (PRO-STAT SUGAR FREE 64)  30 mL Oral Daily  . heparin  5,000 Units Subcutaneous Q8H  . insulin aspart  0-5 Units Subcutaneous QHS  . insulin aspart  0-9 Units Subcutaneous TID WC  . lamiVUDine  25 mg Oral Daily  . losartan  100 mg Oral Daily  . minoxidil  2.5 mg Oral BID  . multivitamin  1 tablet Oral QHS  . piperacillin-tazobactam (ZOSYN)  IV  3.375 g Intravenous Q12H  . sodium chloride flush  3 mL Intravenous Q12H   sodium chloride, sodium chloride, sodium chloride, acetaminophen **OR** acetaminophen, albuterol, alteplase, feeding supplement (NEPRO CARB STEADY), heparin, HYDROcodone-acetaminophen, lidocaine  (PF), lidocaine-prilocaine, loperamide, morphine injection, ondansetron **OR** ondansetron (ZOFRAN) IV, senna-docusate, sodium chloride flush  Assessment/ Plan:  46 y.o.African American male with ESRD on HD since APR 2017, DM-2, HTN, HIV, was admitted on 01/31/2017 with Left foot diabetic ulcer and osteomyelitis  [facility]/ [address] [place], MWF/  4.5 hrs  1. ESRD 2. AOCKD 3. SHPTH 4. Left Diabetic foot ulcer and osteomyelitis  Plan: HD MWF while in hospital-  Patient seen during dialysis Tolerating well  Monitor phos and Hgb  MRI confirms osteomyelitis of foot.  Evaluated by podiatry (Dr Vickki Muff) and infectious disease.   Underwent partial 5th ray amputation on 3/30          LOS: 5 Rakel Junio 3/31/201811:56 AM

## 2017-02-05 NOTE — Evaluation (Signed)
Physical Therapy Evaluation Patient Details Name: Nathan Ayers MRN: [id number] DOB: June 02, 1971 Today's Date: 02/05/2017   History of Present Illness  46 y/o male HD pt.  L 5th toe osteomylitis with amputation 3/30.  Clinical Impression  Pt showed good effort with PT exam, but did not do as well with ambulation as he initially thought he would (decided he was not confident/safe enough on the steps or to use crutches instead of walker).  He was able to maintain NWBing on the L during all tasks, and did not need direct assist with most tasks.  Pt reports that his parents will be able to stay with him, but he has no contacted them yet, encouraged him to do so as he would be unable to manage at home w/o assist. Pt eager to get home, but realized post PT exam that he will need more assist than he initially expected.     Follow Up Recommendations Home health PT    Equipment Recommendations  Rolling walker with 5" wheels - tall height. Possibly knee scooter?   Recommendations for Other Services       Precautions / Restrictions Precautions Precautions: Fall Restrictions Weight Bearing Restrictions: Yes LLE Weight Bearing: Non weight bearing      Mobility  Bed Mobility Overal bed mobility: Independent             General bed mobility comments: able to get up to EOB w/o assist  Transfers Overall transfer level: Modified independent Equipment used: Rolling walker (2 wheeled)             General transfer comment: Pt needed extra cuing and some assist to stabilize in standing  Ambulation/Gait Ambulation/Gait assistance: Supervision Ambulation Distance (Feet): 50 Feet Assistive device: Rolling walker (2 wheeled)       General Gait Details: Pt able to use walker effectively to keep weight off the L LE.  He initially was unsure of his balance, but after taking a few steps felt okay with walker (did not feel confident enough to try less stable crutches).    Stairs Stairs:  Yes Stairs assistance: Min guard Stair Management: One rail Left Number of Stairs: 2 General stair comments: Initial plan was to do flight of steps, pt did a few and reports "I'm just going to stay on the first floor."  He showed good effort and was able to do 2 steps w/o direct assist; but with great effort and poor confidence.   Wheelchair Mobility    Modified Rankin (Stroke Patients Only)       Balance Overall balance assessment: Modified Independent                                           Pertinent Vitals/Pain Pain Assessment: 0-10 Pain Score: 2  Pain Location: surgical site    Home Living Family/patient expects to be discharged to:: Private residence Living Arrangements: Alone Available Help at Discharge: Family (reports parents can/will stay with him as long as he needs) Type of Home: House Home Access: Stairs to enter Entrance Stairs-Rails:  (wall) Entrance Stairs-Number of Steps: 2 Home Layout: Two level;1/2 bath on main level;Bed/bath upstairs Home Equipment: None      Prior Function Level of Independence: Independent         Comments: Pt drives himself to dialysis, able to run errands, etc     Hand Dominance  Extremity/Trunk Assessment   Upper Extremity Assessment Upper Extremity Assessment: Overall WFL for tasks assessed    Lower Extremity Assessment Lower Extremity Assessment: Overall WFL for tasks assessed       Communication   Communication: No difficulties  Cognition Arousal/Alertness: Awake/alert Behavior During Therapy: WFL for tasks assessed/performed Overall Cognitive Status: Within Functional Limits for tasks assessed                                        General Comments      Exercises     Assessment/Plan    PT Assessment Patient needs continued PT services  PT Problem List Decreased strength;Decreased activity tolerance;Decreased range of motion;Decreased balance;Decreased  mobility;Decreased knowledge of use of DME;Decreased safety awareness;Decreased knowledge of precautions;Pain       PT Treatment Interventions DME instruction;Gait training;Stair training;Functional mobility training;Therapeutic activities;Therapeutic exercise;Balance training;Neuromuscular re-education;Patient/family education    PT Goals (Current goals can be found in the Care Plan section)  Acute Rehab PT Goals Patient Stated Goal: go home PT Goal Formulation: With patient Time For Goal Achievement: 02/19/17 Potential to Achieve Goals: Fair    Frequency 7X/week   Barriers to discharge        Co-evaluation               End of Session Equipment Utilized During Treatment: Gait belt Activity Tolerance: Patient limited by fatigue;Patient tolerated treatment well Patient left: with chair alarm set;with call bell/phone within reach Nurse Communication: Mobility status PT Visit Diagnosis: Difficulty in walking, not elsewhere classified (R26.2);Muscle weakness (generalized) (M62.81)    Time: 9643-8381 PT Time Calculation (min) (ACUTE ONLY): 28 min   Charges:   PT Evaluation $PT Eval Low Complexity: 1 Procedure     PT G Codes:         Kreg Shropshire, DPT 02/05/2017, 10:46 AM

## 2017-02-05 NOTE — Progress Notes (Signed)
Hd start

## 2017-02-05 NOTE — Progress Notes (Signed)
This note also relates to the following rows which could not be included: Pulse Rate - Cannot attach notes to unvalidated device data BP - Cannot attach notes to unvalidated device data SpO2 - Cannot attach notes to unvalidated device data  Post hd vitals

## 2017-02-05 NOTE — Progress Notes (Signed)
Daily Progress Note   Subjective  - 1 Day Post-Op  f/u left 5th ray amputation. Minimal pain.    Objective Vitals:   02/04/17 1656 02/04/17 1808 02/04/17 2021 02/05/17 0626  BP: (!) 154/76 (!) 147/74 115/61 (!) 149/75  Pulse: 83 84 81 88  Resp: 12 16 18 16   Temp: 97.4 F (36.3 C) 98.2 F (36.8 C) 97.5 F (36.4 C) 99.6 F (37.6 C)  TempSrc: Oral Oral Oral Oral  SpO2: 99% 100% 97% 100%  Weight:      Height:        Physical Exam: Bandage with moderate bloody drainage.  No active bleeding with dressing change today.  Wound coapted dorsally. No purulence from plantar wound.  Laboratory CBC    Component Value Date/Time   WBC 9.7 02/03/2017 0532   HGB 10.7 (L) 02/03/2017 0532   HCT 31.4 (L) 02/03/2017 0532   PLT 245 02/03/2017 0532    BMET    Component Value Date/Time   NA 134 (L) 02/02/2017 0935   K 4.3 02/02/2017 0935   CL 97 (L) 02/02/2017 0935   CO2 26 02/02/2017 0935   GLUCOSE 136 (H) 02/02/2017 0935   BUN 68 (H) 02/02/2017 0935   CREATININE 13.76 (H) 02/02/2017 0935   CREATININE 1.08 07/19/2013 1631   CALCIUM 8.0 (L) 02/02/2017 0935   GFRNONAA 4 (L) 02/02/2017 0935   GFRNONAA 85 07/19/2013 1631   GFRAA 4 (L) 02/02/2017 0935   GFRAA >89 07/19/2013 1631   Results for orders placed or performed during the hospital encounter of 01/31/17  Culture, blood (Routine x 2)     Status: None   Collection Time: 01/31/17  3:32 PM  Result Value Ref Range Status   Specimen Description BLOOD LEFT ANTECUBITAL  Final   Special Requests   Final    BOTTLES DRAWN AEROBIC AND ANAEROBIC Blood Culture adequate volume   Culture NO GROWTH 5 DAYS  Final   Report Status 02/05/2017 FINAL  Final  Culture, blood (Routine x 2)     Status: None   Collection Time: 01/31/17  3:32 PM  Result Value Ref Range Status   Specimen Description BLOOD LEFT FOREARM  Final   Special Requests   Final    BOTTLES DRAWN AEROBIC AND ANAEROBIC Blood Culture adequate volume   Culture NO GROWTH 5 DAYS   Final   Report Status 02/05/2017 FINAL  Final  Aerobic/Anaerobic Culture (surgical/deep wound)     Status: Abnormal   Collection Time: 01/31/17  4:00 PM  Result Value Ref Range Status   Specimen Description ABSCESS  Final   Special Requests Immunocompromised  Final   Gram Stain   Final    MODERATE WBC PRESENT,BOTH PMN AND MONONUCLEAR ABUNDANT GRAM NEGATIVE RODS ABUNDANT GRAM POSITIVE COCCI IN PAIRS RARE GRAM POSITIVE RODS Performed at [facility], [place] [address], [place], [place] [address]    Culture (A)  Final    MULTIPLE ORGANISMS PRESENT, NONE PREDOMINANT MIXED ANAEROBIC FLORA PRESENT.  CALL LAB IF FURTHER IID REQUIRED.    Report Status 02/04/2017 FINAL  Final  MRSA PCR Screening     Status: None   Collection Time: 02/01/17  8:26 AM  Result Value Ref Range Status   MRSA by PCR NEGATIVE NEGATIVE Final    Comment:        The GeneXpert MRSA Assay (FDA approved for NASAL specimens only), is one component of a comprehensive MRSA colonization surveillance program. It is not intended to diagnose MRSA infection  nor to guide or monitor treatment for MRSA infections.   Aerobic/Anaerobic Culture (surgical/deep wound)     Status: None (Preliminary result)   Collection Time: 02/04/17  1:22 PM  Result Value Ref Range Status   Specimen Description TOE TOE  Final   Special Requests NONE  Final   Gram Stain   Final    FEW WBC PRESENT, PREDOMINANTLY PMN RARE GRAM POSITIVE COCCI IN PAIRS Performed at [facility], [address] [place], [place] [address]    Culture PENDING  Incomplete   Report Status PENDING  Incomplete    Assessment/Planning: Osteomyelitis left 5th ray.    Dressing changed today.  Will have home health change 3x's per week.  Culture pending.  To receive IV abx at dialysis.  NWB to left foot  f/u with me in 2-3 weeks.   Nathan Ayers A  02/05/2017, 9:05 AM

## 2017-02-05 NOTE — Progress Notes (Signed)
[facility] [place] NAME: Nathan Ayers    MR#:  [id number]  DATE OF BIRTH:  1971-03-26  SUBJECTIVE:   Patient here due to sepsis secondary to a left diabetic foot ulcer. MRI showing evidence of Osteomyelitis.   Surgery 02/04/2017.  Walk with physical therapy today had some bleeding from surgical site after that. Now stopped.  Dressing changed by Dr. Vickki Muff.  REVIEW OF SYSTEMS:    Review of Systems  Constitutional: Negative for chills and fever.  HENT: Negative for congestion and tinnitus.   Eyes: Negative for blurred vision and double vision.  Respiratory: Negative for cough, shortness of breath and wheezing.   Cardiovascular: Negative for chest pain, orthopnea and PND.  Gastrointestinal: Negative for abdominal pain, diarrhea, nausea and vomiting.  Genitourinary: Negative for dysuria and hematuria.  Neurological: Positive for weakness. Negative for dizziness, sensory change and focal weakness.  All other systems reviewed and are negative.  Nutrition: Renal/Carb diet Tolerating Diet: Yes Tolerating PT: Await Eval  DRUG ALLERGIES:  No Known Allergies  VITALS:  Blood pressure (!) 149/90, pulse 80, temperature 99 F (37.2 C), temperature source Oral, resp. rate 18, height 6' 6"  (1.981 m), weight 100 kg (220 lb 7.4 oz), SpO2 99 %.  PHYSICAL EXAMINATION:   Physical Exam  GENERAL:  46 y.o.-year-old patient lying in bed in no acute distress.  EYES: Pupils equal, round, reactive to light and accommodation. No scleral icterus. Extraocular muscles intact.  HEENT: Head atraumatic, normocephalic. Oropharynx and nasopharynx clear.  NECK:  Supple, no jugular venous distention. No thyroid enlargement, no tenderness.  LUNGS: Normal breath sounds bilaterally, no wheezing, rales, rhonchi. No use of accessory muscles of respiration.  CARDIOVASCULAR: S1, S2 normal. No murmurs, rubs, or gallops.  ABDOMEN: Soft, nontender, nondistended. Bowel  sounds present. No organomegaly or mass.  EXTREMITIES: No cyanosis, clubbing or edema b/l.    NEUROLOGIC: Cranial nerves II through XII are intact. No focal Motor or sensory deficits b/l.   PSYCHIATRIC: The patient is alert and oriented x 3.  SKIN: Dressing over left foot.  LABORATORY PANEL:   CBC  Recent Labs Lab 02/03/17 0532  WBC 9.7  HGB 10.7*  HCT 31.4*  PLT 245   ---------------------------------------------------------------------------------------------------------  Chemistries   Recent Labs Lab 01/31/17 1600  02/02/17 0935  NA 132*  < > 134*  K 4.0  < > 4.3  CL 94*  < > 97*  CO2 28  < > 26  GLUCOSE 182*  < > 136*  BUN 37*  < > 68*  CREATININE 9.34*  < > 13.76*  CALCIUM 8.7*  < > 8.0*  AST 11*  --   --   ALT 10*  --   --   ALKPHOS 68  --   --   BILITOT 0.7  --   --   < > = values in this interval not displayed. ---------------------------------------------------------------------------------------------------------  Cardiac Enzymes  Recent Labs Lab 01/31/17 1600  TROPONINI 0.03*   ------------------------------------------------------------------------------------------------------------------  RADIOLOGY:  No results found.   ASSESSMENT AND PLAN:   46 year old history of HIV, hypertension, diabetes, end-stage renal disease hemodialysis, diabetic neuropathy who presented to the hospital due to fever and a left foot ulcer with foul-smelling drainage.  1. Sepsis- POA and now resolved -Continue IV Zosyn - Wound cultures growing multiple organisms. currently afebrile.  2. Left foot ulcer with foul-smelling drainage- cause of patient's sepsis. -Continue IV Zosyn. Appreciate podiatry input MRI showing evidence of osteomyelitis in the  fifth metatarsal head and proximal base of the fifth proximal phalanx. surgical debridement 01/25/2017. - likely long term abx but can be done at HD. Wait for intra op cultures. Likely d/c 1-2 days once cx  available  3. End Stage renal disease on hemodialysis-Nephrology following. Cont. HD on MWF.   4. Hx of HIV - cont. HAART.   - No acute issue. Appreciate ID input.  5. DM - cont. SSI.   - BS stable.   6. Secondary hyperparathyroidism-continue PhosLo.  7. Essential hypertension-continue losartan, minoxidil, Coreg.  All the records are reviewed and case discussed with Care Management/Social Worker. Management plans discussed with the patient, family and they are in agreement.  CODE STATUS: Full Code  DVT Prophylaxis: Heparin SQ  TOTAL TIME TAKING CARE OF THIS PATIENT: 30 minutes.   POSSIBLE D/C IN 2-3 DAYS, DEPENDING ON CLINICAL CONDITION.  Hillary Bow R M.D on 02/05/2017 at 11:36 AM  Between 7am to 6pm - Pager - [phone]  After 6pm go to [email or web address] - [employer] [facility] [facility]  Office  [phone]  CC: Primary care physician; No PCP Per Patient

## 2017-02-05 NOTE — Progress Notes (Signed)
Pre hd assessment

## 2017-02-05 NOTE — Progress Notes (Signed)
Post hd assessment

## 2017-02-06 LAB — CBC
HEMATOCRIT: 29.1 % — AB (ref 40.0–52.0)
HEMOGLOBIN: 9.6 g/dL — AB (ref 13.0–18.0)
MCH: 29.3 pg (ref 26.0–34.0)
MCHC: 33.1 g/dL (ref 32.0–36.0)
MCV: 88.3 fL (ref 80.0–100.0)
Platelets: 264 10*3/uL (ref 150–440)
RBC: 3.29 MIL/uL — ABNORMAL LOW (ref 4.40–5.90)
RDW: 17.4 % — ABNORMAL HIGH (ref 11.5–14.5)
WBC: 7.7 10*3/uL (ref 3.8–10.6)

## 2017-02-06 LAB — GLUCOSE, CAPILLARY
GLUCOSE-CAPILLARY: 132 mg/dL — AB (ref 65–99)
GLUCOSE-CAPILLARY: 138 mg/dL — AB (ref 65–99)
Glucose-Capillary: 95 mg/dL (ref 65–99)
Glucose-Capillary: 146 mg/dL — ABNORMAL HIGH (ref 65–99)

## 2017-02-06 MED ORDER — CALCIUM ACETATE (PHOS BINDER) 667 MG PO CAPS
1334.0000 mg | ORAL_CAPSULE | Freq: Three times a day (TID) | ORAL | Status: DC
  Administered 2017-02-06 – 2017-02-08 (×7): 1334 mg via ORAL
  Filled 2017-02-06 (×7): qty 2

## 2017-02-06 NOTE — Progress Notes (Signed)
Subjective:   Patient  Is doing fair No acute changes or c/o States that his bleeding is better controlled now   Objective:  Vital signs in last 24 hours:  Temp:  [98 F (36.7 C)-98.6 F (37 C)] 98.3 F (36.8 C) (04/01 0510) Pulse Rate:  [77-83] 79 (04/01 0510) Resp:  [15-18] 16 (04/01 0510) BP: (122-164)/(63-90) 122/63 (04/01 0510) SpO2:  [93 %-100 %] 100 % (04/01 0510) Weight:  [98.9 kg (218 lb 0.6 oz)] 98.9 kg (218 lb 0.6 oz) (03/31 1345)  Weight change: 0.209 kg (7.4 oz) Filed Weights   02/04/17 1236 02/05/17 1024 02/05/17 1345  Weight: 99.8 kg (220 lb) 100 kg (220 lb 7.4 oz) 98.9 kg (218 lb 0.6 oz)    Intake/Output:    Intake/Output Summary (Last 24 hours) at 02/06/17 1032 Last data filed at 02/06/17 0958  Gross per 24 hour  Intake              720 ml  Output             1550 ml  Net             -830 ml     Physical Exam: General: No acute distress, laying in the bed   HEENT Anicteric, moist oral mucous membranes   Neck Supple   Pulm/lungs Normal breathing effort   CVS/Heart Regular rhythm, no rub or gallop   Abdomen:  Soft, nontender   Extremities: Left foot ulcer   Neurologic: Alert, oriented   Skin: No acute rashes   Access: AV fistula        Basic Metabolic Panel:   Recent Labs Lab 01/31/17 1600 02/01/17 0615 02/02/17 0935  NA 132* 133* 134*  K 4.0 4.3 4.3  CL 94* 95* 97*  CO2 28 28 26   GLUCOSE 182* 95 136*  BUN 37* 46* 68*  CREATININE 9.34* 11.49* 13.76*  CALCIUM 8.7* 8.0* 8.0*  PHOS  --   --  5.7*     CBC:  Recent Labs Lab 01/31/17 1600 02/01/17 0615 02/02/17 0935 02/03/17 0532 02/06/17 0258  WBC 15.2* 20.1* 16.0* 9.7 7.7  NEUTROABS 13.3*  --   --   --   --   HGB 11.5* 9.5* 9.4* 10.7* 9.6*  HCT 34.2* 28.6* 28.1* 31.4* 29.1*  MCV 88.7 88.5 88.9 89.7 88.3  PLT 237 212 244 245 264      Microbiology:  Recent Results (from the past 720 hour(s))  Culture, blood (Routine x 2)     Status: None   Collection Time:  01/31/17  3:32 PM  Result Value Ref Range Status   Specimen Description BLOOD LEFT ANTECUBITAL  Final   Special Requests   Final    BOTTLES DRAWN AEROBIC AND ANAEROBIC Blood Culture adequate volume   Culture NO GROWTH 5 DAYS  Final   Report Status 02/05/2017 FINAL  Final  Culture, blood (Routine x 2)     Status: None   Collection Time: 01/31/17  3:32 PM  Result Value Ref Range Status   Specimen Description BLOOD LEFT FOREARM  Final   Special Requests   Final    BOTTLES DRAWN AEROBIC AND ANAEROBIC Blood Culture adequate volume   Culture NO GROWTH 5 DAYS  Final   Report Status 02/05/2017 FINAL  Final  Aerobic/Anaerobic Culture (surgical/deep wound)     Status: Abnormal   Collection Time: 01/31/17  4:00 PM  Result Value Ref Range Status   Specimen Description ABSCESS  Final   Special  Requests Immunocompromised  Final   Gram Stain   Final    MODERATE WBC PRESENT,BOTH PMN AND MONONUCLEAR ABUNDANT GRAM NEGATIVE RODS ABUNDANT GRAM POSITIVE COCCI IN PAIRS RARE GRAM POSITIVE RODS Performed at [facility], [address] [place], [place] [address]    Culture (A)  Final    MULTIPLE ORGANISMS PRESENT, NONE PREDOMINANT MIXED ANAEROBIC FLORA PRESENT.  CALL LAB IF FURTHER IID REQUIRED.    Report Status 02/04/2017 FINAL  Final  MRSA PCR Screening     Status: None   Collection Time: 02/01/17  8:26 AM  Result Value Ref Range Status   MRSA by PCR NEGATIVE NEGATIVE Final    Comment:        The GeneXpert MRSA Assay (FDA approved for NASAL specimens only), is one component of a comprehensive MRSA colonization surveillance program. It is not intended to diagnose MRSA infection nor to guide or monitor treatment for MRSA infections.   Aerobic/Anaerobic Culture (surgical/deep wound)     Status: None (Preliminary result)   Collection Time: 02/04/17  1:22 PM  Result Value Ref Range Status   Specimen Description TOE TOE  Final   Special Requests NONE  Final   Gram Stain   Final    FEW  WBC PRESENT, PREDOMINANTLY PMN RARE GRAM POSITIVE COCCI IN PAIRS    Culture   Final    TOO YOUNG TO READ Performed at [facility], [place] [address], [place], [place] [address]    Report Status PENDING  Incomplete    Coagulation Studies: No results for input(s): LABPROT, INR in the last 72 hours.  Urinalysis: No results for input(s): COLORURINE, LABSPEC, PHURINE, GLUCOSEU, HGBUR, BILIRUBINUR, KETONESUR, PROTEINUR, UROBILINOGEN, NITRITE, LEUKOCYTESUR in the last 72 hours.  Invalid input(s): APPERANCEUR    Imaging: No results found.   Medications:    . abacavir  300 mg Oral BID  . aspirin  81 mg Oral Daily  . calcium acetate  667 mg Oral TID WC  . carvedilol  25 mg Oral BID WC  . carvedilol  25 mg Oral Once  . dolutegravir  50 mg Oral BID  . epoetin (EPOGEN/PROCRIT) injection  4,000 Units Intravenous Q M,W,F-HD  . feeding supplement (NEPRO CARB STEADY)  237 mL Oral Q1400  . feeding supplement (PRO-STAT SUGAR FREE 64)  30 mL Oral Daily  . heparin  5,000 Units Subcutaneous Q8H  . insulin aspart  0-5 Units Subcutaneous QHS  . insulin aspart  0-9 Units Subcutaneous TID WC  . lamiVUDine  25 mg Oral Daily  . losartan  100 mg Oral Daily  . minoxidil  2.5 mg Oral BID  . multivitamin  1 tablet Oral QHS  . piperacillin-tazobactam (ZOSYN)  IV  3.375 g Intravenous Q12H  . sodium chloride flush  3 mL Intravenous Q12H   sodium chloride, sodium chloride, sodium chloride, acetaminophen **OR** acetaminophen, albuterol, alteplase, feeding supplement (NEPRO CARB STEADY), heparin, HYDROcodone-acetaminophen, lidocaine (PF), lidocaine-prilocaine, loperamide, morphine injection, ondansetron **OR** ondansetron (ZOFRAN) IV, senna-docusate, sodium chloride flush  Assessment/ Plan:  46 y.o.African American male with ESRD on HD since APR 2017, DM-2, HTN, HIV, was admitted on 01/31/2017 with Left foot diabetic ulcer and osteomyelitis  [facility]/ [address] [facility], MWF/ 4.5 hrs  1. ESRD 2. AOCKD 3.  SHPTH 4. Left Diabetic foot ulcer and osteomyelitis  Plan: HD MWF while in hospital- Underwent partial fifth ray amputation of left foot by Dr. Vickki Muff on March 30  Antibiotics for osteomyelitis as per primary team Continue  low-dose EPO Increase PhosLo dose   LOS: 6 Nathan Ayers 4/1/201810:32 AM

## 2017-02-06 NOTE — Progress Notes (Signed)
[facility] [place] NAME: Nathan Ayers    MR#:  [id number]  DATE OF BIRTH:  Sep 23, 1971  SUBJECTIVE:   Patient here due to sepsis secondary to a left diabetic foot ulcer. MRI showing evidence of Osteomyelitis.   Surgery 02/04/2017.  Bleeding from the wound has stopped.  No pain due to neuropathy.  REVIEW OF SYSTEMS:    Review of Systems  Constitutional: Negative for chills and fever.  HENT: Negative for congestion and tinnitus.   Eyes: Negative for blurred vision and double vision.  Respiratory: Negative for cough, shortness of breath and wheezing.   Cardiovascular: Negative for chest pain, orthopnea and PND.  Gastrointestinal: Negative for abdominal pain, diarrhea, nausea and vomiting.  Genitourinary: Negative for dysuria and hematuria.  Neurological: Positive for weakness. Negative for dizziness, sensory change and focal weakness.  All other systems reviewed and are negative.  Nutrition: Renal/Carb diet Tolerating Diet: Yes Tolerating PT: Await Eval  DRUG ALLERGIES:  No Known Allergies  VITALS:  Blood pressure 122/63, pulse 79, temperature 98.3 F (36.8 C), temperature source Oral, resp. rate 16, height 6' 6"  (1.981 m), weight 98.9 kg (218 lb 0.6 oz), SpO2 100 %.  PHYSICAL EXAMINATION:   Physical Exam  GENERAL:  46 y.o.-year-old patient lying in bed in no acute distress.  EYES: Pupils equal, round, reactive to light and accommodation. No scleral icterus. Extraocular muscles intact.  HEENT: Head atraumatic, normocephalic. Oropharynx and nasopharynx clear.  NECK:  Supple, no jugular venous distention. No thyroid enlargement, no tenderness.  LUNGS: Normal breath sounds bilaterally, no wheezing, rales, rhonchi. No use of accessory muscles of respiration.  CARDIOVASCULAR: S1, S2 normal. No murmurs, rubs, or gallops.  ABDOMEN: Soft, nontender, nondistended. Bowel sounds present. No organomegaly or mass.  EXTREMITIES: No  cyanosis, clubbing or edema b/l.    NEUROLOGIC: Cranial nerves II through XII are intact. No focal Motor or sensory deficits b/l.   PSYCHIATRIC: The patient is alert and oriented x 3.  SKIN: Dressing over left foot.  LABORATORY PANEL:   CBC  Recent Labs Lab 02/06/17 0258  WBC 7.7  HGB 9.6*  HCT 29.1*  PLT 264   ---------------------------------------------------------------------------------------------------------  Chemistries   Recent Labs Lab 01/31/17 1600  02/02/17 0935  NA 132*  < > 134*  K 4.0  < > 4.3  CL 94*  < > 97*  CO2 28  < > 26  GLUCOSE 182*  < > 136*  BUN 37*  < > 68*  CREATININE 9.34*  < > 13.76*  CALCIUM 8.7*  < > 8.0*  AST 11*  --   --   ALT 10*  --   --   ALKPHOS 68  --   --   BILITOT 0.7  --   --   < > = values in this interval not displayed. ---------------------------------------------------------------------------------------------------------  Cardiac Enzymes  Recent Labs Lab 01/31/17 1600  TROPONINI 0.03*   ------------------------------------------------------------------------------------------------------------------  RADIOLOGY:  No results found.   ASSESSMENT AND PLAN:   46 year old history of HIV, hypertension, diabetes, end-stage renal disease hemodialysis, diabetic neuropathy who presented to the hospital due to fever and a left foot ulcer with foul-smelling drainage.  1. Sepsis- POA and now resolved -Continue IV Zosyn - Wound cultures growing multiple organisms. currently afebrile.  2. Left foot ulcer with foul-smelling drainage- cause of patient's sepsis. -Continue IV Zosyn. Appreciate podiatry input MRI showing evidence of osteomyelitis in the fifth metatarsal head and proximal base of the fifth proximal  phalanx. surgical debridement 01/25/2017. - likely long term abx but can be done at HD. Wait for intra op cultures. Likely d/c 1-2 days once cx available  3. End Stage renal disease on hemodialysis-Nephrology  following. Cont. HD on MWF.   4. Hx of HIV - cont. HAART.   - No acute issue. Appreciate ID input.  5. DM - cont. SSI.   - BS stable.   6. Secondary hyperparathyroidism-continue PhosLo.  7. Essential hypertension-continue losartan, minoxidil, Coreg.  All the records are reviewed and case discussed with Care Management/Social Worker. Management plans discussed with the patient, family and they are in agreement.  CODE STATUS: Full Code  DVT Prophylaxis: Heparin SQ  TOTAL TIME TAKING CARE OF THIS PATIENT: 30 minutes.   Likely discharge tomorrow once culture results available  Neita Carp M.D on 02/06/2017 at 11:45 AM  Between 7am to 6pm - Pager - [phone]  After 6pm go to [email or web address] - [employer] [facility] [facility]  Office  [phone]  CC: Primary care physician; No PCP Per Patient

## 2017-02-06 NOTE — Progress Notes (Signed)
qPhysical Therapy Treatment Patient Details Name: Nathan Ayers MRN: [id number] DOB: February 09, 1971 Today's Date: 02/06/2017    History of Present Illness 46 y/o male HD pt.  L 5th toe osteomylitis with amputation 3/30.    PT Comments    Pt did well with prolonged ambulation today and though he had some fatigue and needed cuing he was safe and able to keep weight off L LE w/o issue.  Pt showed good strength and mobility with seated exercises.  He states his parents will be home with him initially to help and he therefore should be able to go home safely with HHPT.   Follow Up Recommendations  Home health PT     Equipment Recommendations  Rolling walker with 5" wheels (tall height)    Recommendations for Other Services       Precautions / Restrictions Precautions Precautions: Fall Required Braces or Orthoses:  (post-op shoe) Restrictions Weight Bearing Restrictions: Yes LLE Weight Bearing: Non weight bearing    Mobility  Bed Mobility Overal bed mobility: Independent             General bed mobility comments: able to get up to EOB w/o assist  Transfers Overall transfer level: Modified independent Equipment used: Rolling walker (2 wheeled)             General transfer comment: Pt continues to need cuing to use UEs appropriate to both rise and return to sitting in a controlled manner  Ambulation/Gait Ambulation/Gait assistance: Supervision Ambulation Distance (Feet): 125 Feet Assistive device: Rolling walker (2 wheeled)       General Gait Details: Pt again able to keep weight off L LE  relatively well.  He did fatigue in UEs with the effort of prolonged ambulation and likely would not have been able to do a whole lot more than that.     Stairs            Wheelchair Mobility    Modified Rankin (Stroke Patients Only)       Balance Overall balance assessment: Modified Independent                                          Cognition  Arousal/Alertness: Awake/alert Behavior During Therapy: WFL for tasks assessed/performed Overall Cognitive Status: Within Functional Limits for tasks assessed                                        Exercises General Exercises - Lower Extremity Ankle Circles/Pumps: AROM;10 reps Long Arc Quad: Strengthening;10 reps Heel Slides: Strengthening;10 reps Hip ABduction/ADduction: Strengthening;10 reps Hip Flexion/Marching: Strengthening;10 reps    General Comments        Pertinent Vitals/Pain Pain Assessment: 0-10 Pain Score: 3     Home Living                      Prior Function            PT Goals (current goals can now be found in the care plan section) Progress towards PT goals: Progressing toward goals    Frequency    7X/week      PT Plan Current plan remains appropriate    Co-evaluation             End of Session Equipment Utilized  During Treatment: Gait belt Activity Tolerance: Patient limited by fatigue;Patient tolerated treatment well Patient left: with chair alarm set;with call bell/phone within reach   PT Visit Diagnosis: Difficulty in walking, not elsewhere classified (R26.2);Muscle weakness (generalized) (M62.81)     Time: 3435-6861 PT Time Calculation (min) (ACUTE ONLY): 26 min  Charges:  $Gait Training: 8-22 mins $Therapeutic Exercise: 8-22 mins                    G Codes:        Kreg Shropshire, DPT 02/06/2017, 3:07 PM

## 2017-02-07 LAB — GLUCOSE, CAPILLARY
Glucose-Capillary: 116 mg/dL — ABNORMAL HIGH (ref 65–99)
Glucose-Capillary: 120 mg/dL — ABNORMAL HIGH (ref 65–99)
Glucose-Capillary: 147 mg/dL — ABNORMAL HIGH (ref 65–99)
Glucose-Capillary: 137 mg/dL — ABNORMAL HIGH (ref 65–99)

## 2017-02-07 LAB — CBC
HCT: 26.8 % — ABNORMAL LOW (ref 40.0–52.0)
Hemoglobin: 9.1 g/dL — ABNORMAL LOW (ref 13.0–18.0)
MCH: 29.4 pg (ref 26.0–34.0)
MCHC: 33.9 g/dL (ref 32.0–36.0)
MCV: 86.6 fL (ref 80.0–100.0)
Platelets: 294 K/uL (ref 150–440)
RBC: 3.09 MIL/uL — ABNORMAL LOW (ref 4.40–5.90)
RDW: 17.7 % — ABNORMAL HIGH (ref 11.5–14.5)
WBC: 8.2 K/uL (ref 3.8–10.6)

## 2017-02-07 LAB — RENAL FUNCTION PANEL
Albumin: 2.7 g/dL — ABNORMAL LOW (ref 3.5–5.0)
Anion gap: 10 (ref 5–15)
BUN: 65 mg/dL — ABNORMAL HIGH (ref 6–20)
CO2: 26 mmol/L (ref 22–32)
Calcium: 8.5 mg/dL — ABNORMAL LOW (ref 8.9–10.3)
Chloride: 98 mmol/L — ABNORMAL LOW (ref 101–111)
Creatinine, Ser: 12.08 mg/dL — ABNORMAL HIGH (ref 0.61–1.24)
GFR calc Af Amer: 5 mL/min — ABNORMAL LOW
GFR calc non Af Amer: 4 mL/min — ABNORMAL LOW
Glucose, Bld: 132 mg/dL — ABNORMAL HIGH (ref 65–99)
Phosphorus: 6.6 mg/dL — ABNORMAL HIGH (ref 2.5–4.6)
Potassium: 4.5 mmol/L (ref 3.5–5.1)
Sodium: 134 mmol/L — ABNORMAL LOW (ref 135–145)

## 2017-02-07 NOTE — Care Management (Addendum)
Patient receives chronic dialysis at [facility] MWF.  Elvera Bicker with [facility] is aware.  PT has recommended home health PT. His current diagnosis is not covered by medicaid.  He will require home health nursing would wound care/instruction/management.  ID has documented patient will require prolonged IV antibiotics that can be given with his dialysis.  Updated Elvera Bicker

## 2017-02-07 NOTE — Progress Notes (Signed)
[facility] INFECTIOUS DISEASE PROGRESS NOTE Date of Admission:  01/31/2017     ID: Nathan Ayers is a 45 y.o. male with DM foot infection and osteo, HIV, ESRD Active Problems:   Sepsis ([place])   Malnutrition of moderate degree   Subjective: Had surgery 3/30 - no fevers, wbc down to 8  ROS  Eleven systems are reviewed and negative except per hpi  Medications:  Antibiotics Given (last 72 hours)    Date/Time Action Medication Dose Rate   02/04/17 1751 Given   lamiVUDine (EPIVIR) 10 MG/ML solution 25 mg 25 mg    02/04/17 1752 Given   piperacillin-tazobactam (ZOSYN) IVPB 3.375 g 3.375 g 12.5 mL/hr   02/04/17 2110 Given   dolutegravir (TIVICAY) tablet 50 mg 50 mg    02/04/17 2111 Given   abacavir (ZIAGEN) tablet 300 mg 300 mg    02/05/17 0522 Given   piperacillin-tazobactam (ZOSYN) IVPB 3.375 g 3.375 g 12.5 mL/hr   02/05/17 0737 Given   abacavir (ZIAGEN) tablet 300 mg 300 mg    02/05/17 0905 Given   dolutegravir (TIVICAY) tablet 50 mg 50 mg    02/05/17 1754 Given   piperacillin-tazobactam (ZOSYN) IVPB 3.375 g 3.375 g 12.5 mL/hr   02/05/17 2127 Given   lamiVUDine (EPIVIR) 10 MG/ML solution 25 mg 25 mg    02/05/17 2127 Given   dolutegravir (TIVICAY) tablet 50 mg 50 mg    02/05/17 2128 Given   abacavir (ZIAGEN) tablet 300 mg 300 mg    02/06/17 0553 Given   piperacillin-tazobactam (ZOSYN) IVPB 3.375 g 3.375 g 12.5 mL/hr   02/06/17 1062 Given   abacavir (ZIAGEN) tablet 300 mg 300 mg    02/06/17 6948 Given   dolutegravir (TIVICAY) tablet 50 mg 50 mg    02/06/17 1757 Given   piperacillin-tazobactam (ZOSYN) IVPB 3.375 g 3.375 g 12.5 mL/hr   02/06/17 2143 Given   abacavir (ZIAGEN) tablet 300 mg 300 mg    02/06/17 2143 Given   lamiVUDine (EPIVIR) 10 MG/ML solution 25 mg 25 mg    02/06/17 2143 Given   dolutegravir (TIVICAY) tablet 50 mg 50 mg    02/07/17 0522 Given   piperacillin-tazobactam (ZOSYN) IVPB 3.375 g 3.375 g 12.5 mL/hr   02/07/17 1455 Given   abacavir (ZIAGEN)  tablet 300 mg 300 mg    02/07/17 1455 Given   dolutegravir (TIVICAY) tablet 50 mg 50 mg      . abacavir  300 mg Oral BID  . aspirin  81 mg Oral Daily  . calcium acetate  1,334 mg Oral TID WC  . carvedilol  25 mg Oral BID WC  . dolutegravir  50 mg Oral BID  . epoetin (EPOGEN/PROCRIT) injection  4,000 Units Intravenous Q M,W,F-HD  . feeding supplement (NEPRO CARB STEADY)  237 mL Oral Q1400  . feeding supplement (PRO-STAT SUGAR FREE 64)  30 mL Oral Daily  . heparin  5,000 Units Subcutaneous Q8H  . insulin aspart  0-5 Units Subcutaneous QHS  . insulin aspart  0-9 Units Subcutaneous TID WC  . lamiVUDine  25 mg Oral Daily  . losartan  100 mg Oral Daily  . minoxidil  2.5 mg Oral BID  . multivitamin  1 tablet Oral QHS  . piperacillin-tazobactam (ZOSYN)  IV  3.375 g Intravenous Q12H  . sodium chloride flush  3 mL Intravenous Q12H    Objective: Vital signs in last 24 hours: Temp:  [98 F (36.7 C)-98.4 F (36.9 C)] 98 F (36.7 C) (04/02 1426)  Pulse Rate:  [80-86] 86 (04/02 1426) Resp:  [11-20] 12 (04/02 1426) BP: (124-164)/(63-89) 164/84 (04/02 1426) SpO2:  [96 %-100 %] 99 % (04/02 1426) Weight:  [97.1 kg (214 lb 1.1 oz)-98.7 kg (217 lb 9.5 oz)] 97.1 kg (214 lb 1.1 oz) (04/02 1351) Constitutional: He is oriented to person, place, and time. He appears well-developed and well-nourished. No distress.  HENT: anicteric  Mouth/Throat: Oropharynx is clear and moist. No oropharyngeal exudate.  Cardiovascular: Normal rate, regular rhythm and normal heart sounds.  Pulmonary/Chest: Effort normal and breath sounds normal. No respiratory distress. He has no wheezes.  Abdominal: Soft. Bowel sounds are normal. He exhibits no distension. There is no tenderness.  Lymphadenopathy: He has no cervical adenopathy.  Neurological: He is alert and oriented to person, place, and time.  Skin:L foot wrapped Psychiatric: He has a flat  affect. His behavior is normal.   Lab Results  Recent Labs   02/06/17 0258 02/07/17 1020  WBC 7.7 8.2  HGB 9.6* 9.1*  HCT 29.1* 26.8*  NA  --  134*  K  --  4.5  CL  --  98*  CO2  --  26  BUN  --  65*  CREATININE  --  12.08*    Microbiology: Results for orders placed or performed during the hospital encounter of 01/31/17  Culture, blood (Routine x 2)     Status: None   Collection Time: 01/31/17  3:32 PM  Result Value Ref Range Status   Specimen Description BLOOD LEFT ANTECUBITAL  Final   Special Requests   Final    BOTTLES DRAWN AEROBIC AND ANAEROBIC Blood Culture adequate volume   Culture NO GROWTH 5 DAYS  Final   Report Status 02/05/2017 FINAL  Final  Culture, blood (Routine x 2)     Status: None   Collection Time: 01/31/17  3:32 PM  Result Value Ref Range Status   Specimen Description BLOOD LEFT FOREARM  Final   Special Requests   Final    BOTTLES DRAWN AEROBIC AND ANAEROBIC Blood Culture adequate volume   Culture NO GROWTH 5 DAYS  Final   Report Status 02/05/2017 FINAL  Final  Aerobic/Anaerobic Culture (surgical/deep wound)     Status: Abnormal   Collection Time: 01/31/17  4:00 PM  Result Value Ref Range Status   Specimen Description ABSCESS  Final   Special Requests Immunocompromised  Final   Gram Stain   Final    MODERATE WBC PRESENT,BOTH PMN AND MONONUCLEAR ABUNDANT GRAM NEGATIVE RODS ABUNDANT GRAM POSITIVE COCCI IN PAIRS RARE GRAM POSITIVE RODS Performed at [facility], [place] [address], [place], [place] [address]    Culture (A)  Final    MULTIPLE ORGANISMS PRESENT, NONE PREDOMINANT MIXED ANAEROBIC FLORA PRESENT.  CALL LAB IF FURTHER IID REQUIRED.    Report Status 02/04/2017 FINAL  Final  MRSA PCR Screening     Status: None   Collection Time: 02/01/17  8:26 AM  Result Value Ref Range Status   MRSA by PCR NEGATIVE NEGATIVE Final    Comment:        The GeneXpert MRSA Assay (FDA approved for NASAL specimens only), is one component of a comprehensive MRSA colonization surveillance program. It is not intended  to diagnose MRSA infection nor to guide or monitor treatment for MRSA infections.   Aerobic/Anaerobic Culture (surgical/deep wound)     Status: None (Preliminary result)   Collection Time: 02/04/17  1:22 PM  Result Value Ref Range Status   Specimen Description  TOE TOE  Final   Special Requests NONE  Final   Gram Stain   Final    FEW WBC PRESENT, PREDOMINANTLY PMN RARE GRAM POSITIVE COCCI IN PAIRS Performed at [facility], [place] [address], [place], [place] [address]    Culture   Final    NORMAL SKIN FLORA AEROBICALLY NO ANAEROBES ISOLATED; CULTURE IN PROGRESS FOR 5 DAYS    Report Status PENDING  Incomplete    Studies/Results: No results found.  Assessment/Plan: Nathan Ayers is a 46 y.o. male with well controlled HIV, DM, PN, ESRD on HD admitted with L foot severe ulcer and underlying osteomyelitis of 5th MT head. Cultures grew mixed aerobic and anaerobic flora. S/p surgery 3/30 with partial 5th ray amputation - abx beads placed.   Recommendations 1. DM foot infection with osteomyelitis- s/p surgery 3/30- cultures mixed. Continue vanco  Change zosyn at dc to ceftazidime for ease of administering at HD Would rec a 4 week course at HD then fu to see if needs orals  2. HIV- well controlled. Cont current meds Thank you very much for the consult. Will follow with you.  Elaine Middleton P   02/07/2017, 4:16 PM

## 2017-02-07 NOTE — Progress Notes (Signed)
Pre HD

## 2017-02-07 NOTE — Progress Notes (Signed)
PT Cancellation Note  Patient Details Name: Nathan Ayers MRN: [id number] DOB: 1971/05/19   Cancelled Treatment:    Reason Eval/Treat Not Completed: Patient at procedure or test/unavailable. Treatment attempted; pt not available. Out of room for dialysis. Re attempt treatment at a later time/date as the schedule allows.     Larae Grooms, PTA 02/07/2017, 11:48 AM

## 2017-02-07 NOTE — Progress Notes (Signed)
Subjective:  Patient completed hemodialysis today. Resting comfortably at the moment.   Objective:  Vital signs in last 24 hours:  Temp:  [98 F (36.7 C)-98.4 F (36.9 C)] 98 F (36.7 C) (04/02 1426) Pulse Rate:  [80-86] 86 (04/02 1426) Resp:  [11-20] 12 (04/02 1426) BP: (124-164)/(63-89) 164/84 (04/02 1426) SpO2:  [96 %-100 %] 99 % (04/02 1426) Weight:  [97.1 kg (214 lb 1.1 oz)-98.7 kg (217 lb 9.5 oz)] 97.1 kg (214 lb 1.1 oz) (04/02 1351)  Weight change: -1.3 kg (-2 lb 13.9 oz) Filed Weights   02/07/17 0542 02/07/17 1012 02/07/17 1351  Weight: 98.7 kg (217 lb 9.5 oz) 98.6 kg (217 lb 6 oz) 97.1 kg (214 lb 1.1 oz)    Intake/Output:    Intake/Output Summary (Last 24 hours) at 02/07/17 1524 Last data filed at 02/07/17 1351  Gross per 24 hour  Intake              300 ml  Output             1700 ml  Net            -1400 ml     Physical Exam: General: No acute distress, laying in the bed   HEENT Anicteric, moist oral mucous membranes   Neck Supple   Pulm/lungs Normal breathing effort, CTAB  CVS/Heart Regular rhythm, no rub or gallop   Abdomen:  Soft, nontender   Extremities: Left foot ulcer   Neurologic: Alert, oriented x 3, follows commands  Skin: No acute rashes   Access: AV fistula        Basic Metabolic Panel:   Recent Labs Lab 01/31/17 1600 02/01/17 0615 02/02/17 0935 02/07/17 1020  NA 132* 133* 134* 134*  K 4.0 4.3 4.3 4.5  CL 94* 95* 97* 98*  CO2 28 28 26 26   GLUCOSE 182* 95 136* 132*  BUN 37* 46* 68* 65*  CREATININE 9.34* 11.49* 13.76* 12.08*  CALCIUM 8.7* 8.0* 8.0* 8.5*  PHOS  --   --  5.7* 6.6*     CBC:  Recent Labs Lab 01/31/17 1600 02/01/17 0615 02/02/17 0935 02/03/17 0532 02/06/17 0258 02/07/17 1020  WBC 15.2* 20.1* 16.0* 9.7 7.7 8.2  NEUTROABS 13.3*  --   --   --   --   --   HGB 11.5* 9.5* 9.4* 10.7* 9.6* 9.1*  HCT 34.2* 28.6* 28.1* 31.4* 29.1* 26.8*  MCV 88.7 88.5 88.9 89.7 88.3 86.6  PLT 237 212 244 245 264 294       Microbiology:  Recent Results (from the past 720 hour(s))  Culture, blood (Routine x 2)     Status: None   Collection Time: 01/31/17  3:32 PM  Result Value Ref Range Status   Specimen Description BLOOD LEFT ANTECUBITAL  Final   Special Requests   Final    BOTTLES DRAWN AEROBIC AND ANAEROBIC Blood Culture adequate volume   Culture NO GROWTH 5 DAYS  Final   Report Status 02/05/2017 FINAL  Final  Culture, blood (Routine x 2)     Status: None   Collection Time: 01/31/17  3:32 PM  Result Value Ref Range Status   Specimen Description BLOOD LEFT FOREARM  Final   Special Requests   Final    BOTTLES DRAWN AEROBIC AND ANAEROBIC Blood Culture adequate volume   Culture NO GROWTH 5 DAYS  Final   Report Status 02/05/2017 FINAL  Final  Aerobic/Anaerobic Culture (surgical/deep wound)     Status: Abnormal  Collection Time: 01/31/17  4:00 PM  Result Value Ref Range Status   Specimen Description ABSCESS  Final   Special Requests Immunocompromised  Final   Gram Stain   Final    MODERATE WBC PRESENT,BOTH PMN AND MONONUCLEAR ABUNDANT GRAM NEGATIVE RODS ABUNDANT GRAM POSITIVE COCCI IN PAIRS RARE GRAM POSITIVE RODS Performed at [facility], [place] [address] [place], [place] [address]    Culture (A)  Final    MULTIPLE ORGANISMS PRESENT, NONE PREDOMINANT MIXED ANAEROBIC FLORA PRESENT.  CALL LAB IF FURTHER IID REQUIRED.    Report Status 02/04/2017 FINAL  Final  MRSA PCR Screening     Status: None   Collection Time: 02/01/17  8:26 AM  Result Value Ref Range Status   MRSA by PCR NEGATIVE NEGATIVE Final    Comment:        The GeneXpert MRSA Assay (FDA approved for NASAL specimens only), is one component of a comprehensive MRSA colonization surveillance program. It is not intended to diagnose MRSA infection nor to guide or monitor treatment for MRSA infections.   Aerobic/Anaerobic Culture (surgical/deep wound)     Status: None (Preliminary result)   Collection Time: 02/04/17   1:22 PM  Result Value Ref Range Status   Specimen Description TOE TOE  Final   Special Requests NONE  Final   Gram Stain   Final    FEW WBC PRESENT, PREDOMINANTLY PMN RARE GRAM POSITIVE COCCI IN PAIRS Performed at [facility], [address] [place], [place] [address]    Culture   Final    NORMAL SKIN FLORA AEROBICALLY NO ANAEROBES ISOLATED; CULTURE IN PROGRESS FOR 5 DAYS    Report Status PENDING  Incomplete    Coagulation Studies: No results for input(s): LABPROT, INR in the last 72 hours.  Urinalysis: No results for input(s): COLORURINE, LABSPEC, PHURINE, GLUCOSEU, HGBUR, BILIRUBINUR, KETONESUR, PROTEINUR, UROBILINOGEN, NITRITE, LEUKOCYTESUR in the last 72 hours.  Invalid input(s): APPERANCEUR    Imaging: No results found.   Medications:    . abacavir  300 mg Oral BID  . aspirin  81 mg Oral Daily  . calcium acetate  1,334 mg Oral TID WC  . carvedilol  25 mg Oral BID WC  . dolutegravir  50 mg Oral BID  . epoetin (EPOGEN/PROCRIT) injection  4,000 Units Intravenous Q M,W,F-HD  . feeding supplement (NEPRO CARB STEADY)  237 mL Oral Q1400  . feeding supplement (PRO-STAT SUGAR FREE 64)  30 mL Oral Daily  . heparin  5,000 Units Subcutaneous Q8H  . insulin aspart  0-5 Units Subcutaneous QHS  . insulin aspart  0-9 Units Subcutaneous TID WC  . lamiVUDine  25 mg Oral Daily  . losartan  100 mg Oral Daily  . minoxidil  2.5 mg Oral BID  . multivitamin  1 tablet Oral QHS  . piperacillin-tazobactam (ZOSYN)  IV  3.375 g Intravenous Q12H  . sodium chloride flush  3 mL Intravenous Q12H   sodium chloride, sodium chloride, sodium chloride, acetaminophen **OR** acetaminophen, albuterol, alteplase, feeding supplement (NEPRO CARB STEADY), heparin, HYDROcodone-acetaminophen, lidocaine (PF), lidocaine-prilocaine, loperamide, morphine injection, ondansetron **OR** ondansetron (ZOFRAN) IV, senna-docusate, sodium chloride flush  Assessment/ Plan:  46 y.o.African American male with  ESRD on HD since APR 2017, DM-2, HTN, HIV, was admitted on 01/31/2017 with Left foot diabetic ulcer and osteomyelitis  [facility]/ [address] [facility], MWF/ 4.5 hrs  1. ESRD on HD MWF 2. AOCKD 3. SHP[facility] 4. Left Diabetic foot ulcer and osteomyelitis  Plan: Patient completed  hemodialysis today and tolerated well.  Continue Epogen 4000 units IV with dialysis.  We will also continue to monitor bone mineral metabolism parameters.  Patient will be continued on PhosLo 2 tablets by mouth 3 times a day with meals for phosphorous control.  Otherwise antibiotic management as per hospitalist and podiatry.   LOS: 7 Maddyson Keil 4/2/20183:24 PM

## 2017-02-07 NOTE — Progress Notes (Signed)
HD initiated without issue via R AVF. Patient currently has no complaints

## 2017-02-07 NOTE — Progress Notes (Signed)
[facility] NAME: Izaha Shughart    MR#:  [id number]  DATE OF BIRTH:  [birth year]  SUBJECTIVE: Seen during dialysis, patient denies any complaints. waiting for  intraoperative cultures from    Patient here due to sepsis secondary to a left diabetic foot ulcer. MRI showing evidence of Osteomyelitis.   Surgery 02/04/2017.Partial fifth ray amputation left foot    No pain due to neuropathy.  REVIEW OF SYSTEMS:    Review of Systems  Constitutional: Positive for weight loss. Negative for chills and fever.  HENT: Negative for congestion and tinnitus.   Eyes: Negative for blurred vision and double vision.  Respiratory: Negative.  Negative for cough, shortness of breath and wheezing.   Cardiovascular: Negative for chest pain, orthopnea and PND.  Gastrointestinal: Negative for abdominal pain, diarrhea, nausea and vomiting.  Genitourinary: Negative for dysuria and hematuria.  Neurological: Positive for weakness. Negative for dizziness, sensory change and focal weakness.  All other systems reviewed and are negative.  Nutrition: Renal/Carb diet Tolerating Diet: Yes Tolerating PT: Await Eval  DRUG ALLERGIES:  No Known Allergies  VITALS:  Blood pressure 130/72, pulse 81, temperature 98.2 F (36.8 C), temperature source Oral, resp. rate 17, height 6' 6"  (1.981 m), weight 98.6 kg (217 lb 6 oz), SpO2 96 %.  PHYSICAL EXAMINATION:   Physical Exam  GENERAL:  46 y.o.-year-old patient lying in bed in no acute distress.  EYES: Pupils equal, round, reactive to light . No scleral icterus. Extraocular muscles intact.  HEENT: Head atraumatic, normocephalic. Oropharynx and nasopharynx clear.  NECK:  Supple, no jugular venous distention. No thyroid enlargement, no tenderness.  LUNGS: Normal breath sounds bilaterally, no wheezing, rales, rhonchi. No use of accessory muscles of respiration.  CARDIOVASCULAR: S1, S2 normal. No murmurs, rubs, or gallops.   ABDOMEN: Soft, nontender, nondistended. Bowel sounds present. No organomegaly or mass.  EXTREMITIES: No cyanosis, clubbing or edema b/l.    NEUROLOGIC: Cranial nerves II through XII are intact. No focal Motor or sensory deficits b/l.   PSYCHIATRIC: The patient is alert and oriented x 3.  SKIN: Dressing over left foot.  LABORATORY PANEL:   CBC  Recent Labs Lab 02/07/17 1020  WBC 8.2  HGB 9.1*  HCT 26.8*  PLT 294   ---------------------------------------------------------------------------------------------------------  Chemistries   Recent Labs Lab 01/31/17 1600  02/07/17 1020  NA 132*  < > 134*  K 4.0  < > 4.5  CL 94*  < > 98*  CO2 28  < > 26  GLUCOSE 182*  < > 132*  BUN 37*  < > 65*  CREATININE 9.34*  < > 12.08*  CALCIUM 8.7*  < > 8.5*  AST 11*  --   --   ALT 10*  --   --   ALKPHOS 68  --   --   BILITOT 0.7  --   --   < > = values in this interval not displayed. ---------------------------------------------------------------------------------------------------------  Cardiac Enzymes  Recent Labs Lab 01/31/17 1600  TROPONINI 0.03*   ------------------------------------------------------------------------------------------------------------------  RADIOLOGY:  No results found.   ASSESSMENT AND PLAN:   46 year old history of HIV, hypertension, diabetes, end-stage renal disease hemodialysis, diabetic neuropathy who presented to the hospital due to fever and a left foot ulcer with foul-smelling drainage.  1. Sepsis- POA and now resolved -Continue IV Zosyn   2. Left foot ulcer with foul-smelling drainage- cause of patient's sepsis. -Continue IV Zosyn. Appreciate podiatry input MRI showing evidence of osteomyelitis  in the fifth metatarsal head and proximal base of the fifth proximal phalanx.urgery 02/04/2017.Partial fifth ray amputation left foot   - likely long term abx but can be done at HD. Wait for intra op cultures. Likely d/c 1-2 days once cx  available  3. End Stage renal disease on hemodialysis-Nephrology following. Cont. HD on MWF.   4. Hx of HIV - cont. HAART.   - No acute issue. Appreciate ID input.  5. DM - cont. SSI.   - BS stable.   6. Secondary hyperparathyroidism-continue PhosLo.  7. Essential hypertension-continue losartan, minoxidil, Coreg.  All the records are reviewed and case discussed with Care Management/Social Worker. Management plans discussed with the patient, family and they are in agreement.  CODE STATUS: Full Code  DVT Prophylaxis: Heparin SQ  TOTAL TIME TAKING CARE OF THIS PATIENT: 30 minutes.   Likely discharge tomorrow once culture results available  Kalyna Paolella M.D on 02/07/2017 at 1:33 PM  Between 7am to 6pm - Pager - [phone]  After 6pm go to [email or web address] - [employer] [facility]  Office  [phone]  CC: Primary care physician; No PCP Per Patient

## 2017-02-07 NOTE — Progress Notes (Signed)
HD completed without issue. Tolerated well

## 2017-02-07 NOTE — Progress Notes (Signed)
qPhysical Therapy Treatment Patient Details Name: DEMONTAE ANTUNES MRN: [id number] DOB: November 15, 1970 Today's Date: 02/07/2017    History of Present Illness 46 y/o male HD pt.  L 5th toe osteomylitis with amputation 3/30.    PT Comments     Pt recently returned from dialysis and notes he is too tired to physically participate with PT. Pt has question regarding assistive device use. Pt questioning if he can use crutches instead of rolling walker. Lengthy discussion regarding pt's activity, dialysis schedule and home set up once discharged to address best assistive device to use in those situations. Compared use of rolling walker versus crutches regarding safety, ease of use and versatility in situations pt will come across. Discussed stair use as well. Pt notes his upper body strength is suffering as well. Advised use of rw for safer and greater ease of use. Pt agreeable to use rolling walker. Offered crutches to try at this time; pt refuses due to fatigue. Discussed possibility of purchase or borrowing crutches later once strength improved or assessed by another healthcare professional. Continue PT to progress strength,endurance to improve functional mobility to allow for an optimal, safe transition home.   Follow Up Recommendations  Home health PT     Equipment Recommendations  Rolling walker with 5" wheels    Recommendations for Other Services       Precautions / Restrictions Precautions Precautions: Fall Restrictions Weight Bearing Restrictions: Yes LLE Weight Bearing: Non weight bearing    Mobility  Bed Mobility               General bed mobility comments: Refused out of bed/physical activity due to fatigue from dialysis  Transfers                    Ambulation/Gait             General Gait Details: Pt has question regarding AD. Pt questioning if he can use crutches instead of rw. Lengthy discussion regarding pt's activity, dialysis schedule and home set up  once discharged to address AD use in those situations. Compared use of rw versus crutches regarding safety, ease of use and versatility in situations pt will come across. Discussed stair use as well. Pt notes his upper body strength is suffering as well. Advised use of rw for safer and greater ease of use. Pt agreeable to use rw. Offered crutches to try at this time; pt refuses due to fatigue.    Stairs            Wheelchair Mobility    Modified Rankin (Stroke Patients Only)       Balance                                            Cognition Arousal/Alertness: Awake/alert Behavior During Therapy: WFL for tasks assessed/performed Overall Cognitive Status: Within Functional Limits for tasks assessed                                        Exercises      General Comments        Pertinent Vitals/Pain      Home Living                      Prior  Function            PT Goals (current goals can now be found in the care plan section) Progress towards PT goals: PT to reassess next treatment    Frequency    7X/week      PT Plan Current plan remains appropriate    Co-evaluation             End of Session   Activity Tolerance: Patient limited by fatigue Patient left: in bed;with call bell/phone within reach;with bed alarm set   PT Visit Diagnosis: Difficulty in walking, not elsewhere classified (R26.2);Muscle weakness (generalized) (M62.81)     Time: 7915-0569 PT Time Calculation (min) (ACUTE ONLY): 13 min  Charges:  $Gait Training: 8-22 mins                    G Codes:        Larae Grooms, PTA 02/07/2017, 3:39 PM

## 2017-02-07 NOTE — Progress Notes (Signed)
Pharmacy Antibiotic Note  Nathan Ayers is a 46 y.o. male admitted on 01/31/2017 with sepsis.  Pharmacy has been consulted for vancomycin and piperacillin/tazobactam dosing.  Patient has ESRD on HD.  This is day #7 of piperacillin/tazobactam.  Plan: Continue Piperacillin/tazobactam 3.375 g IV q12h EI  Height: 6' 6"  (198.1 cm) Weight: 217 lb 9.5 oz (98.7 kg) IBW/kg (Calculated) : 91.4  Temp (24hrs), Avg:98.3 F (36.8 C), Min:98.2 F (36.8 C), Max:98.4 F (36.9 C)   Recent Labs Lab 01/31/17 1600 02/01/17 0615 02/02/17 0935 02/03/17 0532 02/06/17 0258  WBC 15.2* 20.1* 16.0* 9.7 7.7  CREATININE 9.34* 11.49* 13.76*  --   --   LATICACIDVEN 1.6  --   --   --   --     Estimated Creatinine Clearance: 8.9 mL/min (A) (by C-G formula based on SCr of 13.76 mg/dL (H)).    No Known Allergies  Antimicrobials this admission: Piperacillin/tazobactam 3/26 >>  vancomycin 3/26 >> 3/28  Dose adjustments this admission:  Microbiology results: 3/26 BCx: No growth final 3/26 Wound: multiple organisms present, none predominant  Thank you for allowing pharmacy to be a part of this patient's care.  Lenis Noon, PharmD Clinical Pharmacist 02/07/2017 9:40 AM

## 2017-02-07 NOTE — Progress Notes (Signed)
Post HD assessment unchanged

## 2017-02-08 LAB — GLUCOSE, CAPILLARY
GLUCOSE-CAPILLARY: 106 mg/dL — AB (ref 65–99)
GLUCOSE-CAPILLARY: 157 mg/dL — AB (ref 65–99)
Glucose-Capillary: 125 mg/dL — ABNORMAL HIGH (ref 65–99)

## 2017-02-08 LAB — SURGICAL PATHOLOGY

## 2017-02-08 MED ORDER — NEPRO/CARBSTEADY PO LIQD: 237.0000 mL | ORAL | 0 refills | Status: AC | PRN

## 2017-02-08 MED ORDER — VANCOMYCIN HCL IN DEXTROSE 1-5 GM/200ML-% IV SOLN
1000.0000 mg | INTRAVENOUS | Status: DC
  Filled 2017-02-08: qty 200

## 2017-02-08 MED ORDER — VANCOMYCIN HCL IN DEXTROSE 1-5 GM/200ML-% IV SOLN: 1000.0000 mg | INTRAVENOUS | 4 refills | Status: DC

## 2017-02-08 MED ORDER — DEXTROSE 5 % IV SOLN: 2.0000 g | INTRAVENOUS | 0 refills | Status: DC

## 2017-02-08 MED ORDER — PRO-STAT SUGAR FREE PO LIQD: 30.0000 mL | Freq: Every day | ORAL | 0 refills | Status: DC

## 2017-02-08 MED ORDER — DEXTROSE 5 % IV SOLN: 2.0000 g | INTRAVENOUS | Status: DC

## 2017-02-08 MED ORDER — VANCOMYCIN HCL 10 G IV SOLR
1500.0000 mg | Freq: Once | INTRAVENOUS | Status: AC
  Administered 2017-02-08: 1500 mg via INTRAVENOUS
  Filled 2017-02-08: qty 1500

## 2017-02-08 NOTE — Progress Notes (Signed)
02/08/2017  BP 139/76   Pulse 85   Temp 98.4 F (36.9 C) (Oral)   Resp 15   Ht 6' 6"  (1.981 m)   Wt 97.1 kg (214 lb 1.1 oz)   SpO2 100%   BMI 24.74 kg/m  Patient discharged per MD orders. Discharge instructions reviewed with patient and patient verbalized understanding. IV removed per policy.Patient instructed how to wrap foot/change dressing. Home meds returned to patient. Prescriptions discussed and given to patient. Discharged via wheelchair escorted by nursing staff.  Almedia Balls, RN

## 2017-02-08 NOTE — Progress Notes (Signed)
Subjective:  Pt had dialysis yesterday.  Feeling well today.  Counseled pt on increased protein intake.   Objective:  Vital signs in last 24 hours:  Temp:  [98 F (36.7 C)-98.5 F (36.9 C)] 98.2 F (36.8 C) (04/03 0408) Pulse Rate:  [81-91] 91 (04/03 1008) Resp:  [12-17] 16 (04/03 0408) BP: (108-164)/(59-84) 108/59 (04/03 0408) SpO2:  [96 %-100 %] 100 % (04/03 1008) Weight:  [97.1 kg (214 lb 1.1 oz)] 97.1 kg (214 lb 1.1 oz) (04/02 1351)  Weight change: -0.1 kg (-3.5 oz) Filed Weights   02/07/17 0542 02/07/17 1012 02/07/17 1351  Weight: 98.7 kg (217 lb 9.5 oz) 98.6 kg (217 lb 6 oz) 97.1 kg (214 lb 1.1 oz)    Intake/Output:    Intake/Output Summary (Last 24 hours) at 02/08/17 1229 Last data filed at 02/08/17 0900  Gross per 24 hour  Intake              240 ml  Output             1500 ml  Net            -1260 ml     Physical Exam: General: No acute distress  HEENT Anicteric, moist oral mucous membranes   Neck Supple   Pulm/lungs Normal breathing effort, CTAB  CVS/Heart Regular rhythm, no rub or gallop   Abdomen:  Soft, nontender   Extremities: Left foot bandaged  Neurologic: Alert, oriented x 3, follows commands  Skin: No acute rashes   Access: AV fistula        Basic Metabolic Panel:   Recent Labs Lab 02/02/17 0935 02/07/17 1020  NA 134* 134*  K 4.3 4.5  CL 97* 98*  CO2 26 26  GLUCOSE 136* 132*  BUN 68* 65*  CREATININE 13.76* 12.08*  CALCIUM 8.0* 8.5*  PHOS 5.7* 6.6*     CBC:  Recent Labs Lab 02/02/17 0935 02/03/17 0532 02/06/17 0258 02/07/17 1020  WBC 16.0* 9.7 7.7 8.2  HGB 9.4* 10.7* 9.6* 9.1*  HCT 28.1* 31.4* 29.1* 26.8*  MCV 88.9 89.7 88.3 86.6  PLT 244 245 264 294      Microbiology:  Recent Results (from the past 720 hour(s))  Culture, blood (Routine x 2)     Status: None   Collection Time: 01/31/17  3:32 PM  Result Value Ref Range Status   Specimen Description BLOOD LEFT ANTECUBITAL  Final   Special Requests   Final   BOTTLES DRAWN AEROBIC AND ANAEROBIC Blood Culture adequate volume   Culture NO GROWTH 5 DAYS  Final   Report Status 02/05/2017 FINAL  Final  Culture, blood (Routine x 2)     Status: None   Collection Time: 01/31/17  3:32 PM  Result Value Ref Range Status   Specimen Description BLOOD LEFT FOREARM  Final   Special Requests   Final    BOTTLES DRAWN AEROBIC AND ANAEROBIC Blood Culture adequate volume   Culture NO GROWTH 5 DAYS  Final   Report Status 02/05/2017 FINAL  Final  Aerobic/Anaerobic Culture (surgical/deep wound)     Status: Abnormal   Collection Time: 01/31/17  4:00 PM  Result Value Ref Range Status   Specimen Description ABSCESS  Final   Special Requests Immunocompromised  Final   Gram Stain   Final    MODERATE WBC PRESENT,BOTH PMN AND MONONUCLEAR ABUNDANT GRAM NEGATIVE RODS ABUNDANT GRAM POSITIVE COCCI IN PAIRS RARE GRAM POSITIVE RODS Performed at [facility], [address],  [place] [address]    Culture (A)  Final    MULTIPLE ORGANISMS PRESENT, NONE PREDOMINANT MIXED ANAEROBIC FLORA PRESENT.  CALL LAB IF FURTHER IID REQUIRED.    Report Status 02/04/2017 FINAL  Final  MRSA PCR Screening     Status: None   Collection Time: 02/01/17  8:26 AM  Result Value Ref Range Status   MRSA by PCR NEGATIVE NEGATIVE Final    Comment:        The GeneXpert MRSA Assay (FDA approved for NASAL specimens only), is one component of a comprehensive MRSA colonization surveillance program. It is not intended to diagnose MRSA infection nor to guide or monitor treatment for MRSA infections.   Aerobic/Anaerobic Culture (surgical/deep wound)     Status: None (Preliminary result)   Collection Time: 02/04/17  1:22 PM  Result Value Ref Range Status   Specimen Description TOE TOE  Final   Special Requests NONE  Final   Gram Stain   Final    FEW WBC PRESENT, PREDOMINANTLY PMN RARE GRAM POSITIVE COCCI IN PAIRS Performed at [facility], [address] [place],  [place] [address]    Culture   Final    NORMAL SKIN FLORA AEROBICALLY NO ANAEROBES ISOLATED; CULTURE IN PROGRESS FOR 5 DAYS    Report Status PENDING  Incomplete    Coagulation Studies: No results for input(s): LABPROT, INR in the last 72 hours.  Urinalysis: No results for input(s): COLORURINE, LABSPEC, PHURINE, GLUCOSEU, HGBUR, BILIRUBINUR, KETONESUR, PROTEINUR, UROBILINOGEN, NITRITE, LEUKOCYTESUR in the last 72 hours.  Invalid input(s): APPERANCEUR    Imaging: No results found.   Medications:    . abacavir  300 mg Oral BID  . aspirin  81 mg Oral Daily  . calcium acetate  1,334 mg Oral TID WC  . carvedilol  25 mg Oral BID WC  . dolutegravir  50 mg Oral BID  . epoetin (EPOGEN/PROCRIT) injection  4,000 Units Intravenous Q M,W,F-HD  . feeding supplement (NEPRO CARB STEADY)  237 mL Oral Q1400  . feeding supplement (PRO-STAT SUGAR FREE 64)  30 mL Oral Daily  . heparin  5,000 Units Subcutaneous Q8H  . insulin aspart  0-5 Units Subcutaneous QHS  . insulin aspart  0-9 Units Subcutaneous TID WC  . lamiVUDine  25 mg Oral Daily  . losartan  100 mg Oral Daily  . minoxidil  2.5 mg Oral BID  . multivitamin  1 tablet Oral QHS  . piperacillin-tazobactam (ZOSYN)  IV  3.375 g Intravenous Q12H  . sodium chloride flush  3 mL Intravenous Q12H   sodium chloride, sodium chloride, sodium chloride, acetaminophen **OR** acetaminophen, albuterol, alteplase, feeding supplement (NEPRO CARB STEADY), heparin, HYDROcodone-acetaminophen, lidocaine (PF), lidocaine-prilocaine, loperamide, morphine injection, ondansetron **OR** ondansetron (ZOFRAN) IV, senna-docusate, sodium chloride flush  Assessment/ Plan:  46 y.o.African American male with ESRD on HD since APR 2017, DM-2, HTN, HIV, was admitted on 01/31/2017 with Left foot diabetic ulcer and osteomyelitis  [facility]/ [address] [facility], MWF/ 4.5 hrs  1. ESRD on HD MWF 2. [facility] 3. [facility] 4. Left Diabetic foot ulcer and osteomyelitis  Plan: Patient had  hemodialysis yesterday. He tolerated this well. He will resume his normal outpatient dialysis schedule tomorrow.  Patient has had low albumin as an outpatient. We counseled the patient on increasing his protein intake to help improve his albumin as he has a healing surgical wound on his left foot now. Patient verbalized understanding of this. Thanks for allowing [place] to participate in his care.  LOS: 8  Adilynn Bessey 4/3/201812:29 PM

## 2017-02-08 NOTE — Progress Notes (Signed)
Pharmacy Antibiotic Note  Nathan Ayers is a 46 y.o. male admitted on 01/31/2017 with OM.  Pharmacy has been consulted for vancomycin and ceftazidime dosing.  Patient has ESRD on HD MWF. Antibiotics being switched from piperacillin/tazobactam to ceftazidime and vancomycin on discharge today.   Plan: Vancomycin 1500 mg loading dose today followed by vancomycin 1000 mg with HD MWF.  Ceftazidime 2 g IV post-HD on MWF  Height: 6' 6"  (198.1 cm) Weight: 214 lb 1.1 oz (97.1 kg) IBW/kg (Calculated) : 91.4  Temp (24hrs), Avg:98.2 F (36.8 C), Min:98 F (36.7 C), Max:98.5 F (36.9 C)   Recent Labs Lab 02/02/17 0935 02/03/17 0532 02/06/17 0258 02/07/17 1020  WBC 16.0* 9.7 7.7 8.2  CREATININE 13.76*  --   --  12.08*    Estimated Creatinine Clearance: 10.2 mL/min (A) (by C-G formula based on SCr of 12.08 mg/dL (H)).    No Known Allergies  Thank you for allowing pharmacy to be a part of this patient's care.  Darylene Price Crystel Demarco 02/08/2017 1:46 PM

## 2017-02-08 NOTE — Progress Notes (Signed)
qPhysical Therapy Treatment Patient Details Name: Nathan Ayers MRN: [id number] DOB: 1971-02-05 Today's Date: 02/08/2017    History of Present Illness 46 y/o male HD pt.  L 5th toe osteomylitis with amputation 3/30.    PT Comments    Pt agreeable to PT; reports minimal pain left foot. Pt does note some dizziness with change in position that does subside with rest time before further activity. Pt feels this is due to medication that is given differently here than at home. Advised pt to discuss with nurse. Pt demonstrating I with bed mobility and supervision/Mod I with transfers and ambulation in the room as well as static stand for hygiene at the sink. Discussed needs for strengthening exercises; pt feels need for upper extremities, which was addressed this session. Pt given written instructions and T band for upper extremity/upper back strengthening with instruction for carryover at home regarding technique and progression/regression. Continue PT to progress strength, and endurance for improved functional mobility to ensure a safe transition home.   Follow Up Recommendations  Home health PT     Equipment Recommendations  Rolling walker with 5" wheels    Recommendations for Other Services       Precautions / Restrictions Precautions Precautions: Fall Restrictions Weight Bearing Restrictions: Yes LLE Weight Bearing: Non weight bearing    Mobility  Bed Mobility Overal bed mobility: Independent             General bed mobility comments: No issues; good speed, not effortful. Pt does experience mild dizziness with change in position that pt feels is medication induced  Transfers Overall transfer level: Modified independent Equipment used: Rolling walker (2 wheeled)             General transfer comment: Safe use of hands/technique. Encouraged to stand and ensure no any dizziness resolves before ambulation  Ambulation/Gait Ambulation/Gait assistance:  Supervision Ambulation Distance (Feet): 10 Feet (performed 2x w/ lengthy stand time btn for hygiene) Assistive device: Rolling walker (2 wheeled) Gait Pattern/deviations: WFL(Within Functional Limits) Gait velocity: reduced Gait velocity interpretation: Below normal speed for age/gender General Gait Details: Safe use of rw. Able to maintain NWB on L. Re iterated ease of functional use of rw vs. crutches, as rw stays safely around pt for quick use if needed.    Stairs            Wheelchair Mobility    Modified Rankin (Stroke Patients Only)       Balance Overall balance assessment: Modified Independent                                          Cognition Arousal/Alertness: Awake/alert Behavior During Therapy: WFL for tasks assessed/performed Overall Cognitive Status: Within Functional Limits for tasks assessed                                        Exercises General Exercises - Upper Extremity Shoulder Horizontal ABduction: Strengthening;Both;10 reps;Seated (with T band) Elbow Flexion: Strengthening;Both;10 reps;Seated (with T band) Elbow Extension: Strengthening;Both;10 reps;Seated (T band) Chair Push Up: Strengthening;10 reps;Seated Other Exercises Other Exercises: Diagonal scap retraction B 10x each Other Exercises: chest press with t band seated 10x  Other Exercises: Pt given yellow and red t band    General Comments  Pertinent Vitals/Pain Pain Assessment: 0-10 Pain Score: 1  Pain Location: surgical site Pain Intervention(s): Monitored during session;Premedicated before session    Home Living                      Prior Function            PT Goals (current goals can now be found in the care plan section) Progress towards PT goals: Progressing toward goals    Frequency    7X/week      PT Plan Current plan remains appropriate    Co-evaluation             End of Session   Activity  Tolerance: Patient tolerated treatment well;Other (comment) (mild dizziness) Patient left: in bed;with call bell/phone within reach;Other (comment) (MD in room)   PT Visit Diagnosis: Difficulty in walking, not elsewhere classified (R26.2);Muscle weakness (generalized) (M62.81)     Time: 2426-8341 PT Time Calculation (min) (ACUTE ONLY): 33 min  Charges:  $Gait Training: 8-22 mins $Therapeutic Exercise: 8-22 mins                    G Codes:        Larae Grooms, PTA 02/08/2017, 11:13 AM

## 2017-02-08 NOTE — Progress Notes (Signed)
Infectious Disease Long Term IV Antibiotic Orders  Diagnosis: DM foot infection with osteomyelitis  Culture results Mixed cultures  Allergies: No Known Allergies  Discharge antibiotics Vancomycin   1000  mg  every      Dialysis session Goal vancomycin trough 15-20.    Pharmacy to adjust dosing based on levels Ceftazidime 2 gm IV  At each Hemodialysis session  PICC Care per protocol Labs weekly while on IV antibiotics      CBC w diff   Comprehensive met panel Vancomycin Trough   CRP    Planned duration of antibiotics 4 weeks from 3/30   Stop date 4/28  Follow up clinic date Within 2 weeks of dc  FAX weekly labs to [phone]  Leonel Ramsay, MD

## 2017-02-08 NOTE — Care Management (Signed)
Patient to discharge home today.  [facility] liaison notified of discharge.  Plan to have antibiotics at HD. RW delivered to room.  Patient does not insurance coverage for home health PT.  Patient states that he does not have a PCP.  I have called Medicaid [place] [facility].  The rep stated that the patient's PCP listed is [facility] [place].  Rep states that patient would have to call and request PCP to be changed, that it was not something I could do as a case Freight forwarder.  Rep also states that after patient calls for a change, it will not go in to effect until the following month.  Patient updated with this information.  Due to not having a PCP patient will not be eligible for home health services for dressing changes.  Patient to have bulk dry dressing changed 3 times a week.  [facility] liaison has checked with outpatient HD clinic to see if this is something they are able to accomodate.  They are not able to.  Patient agreeable to change his own dressings.  Bedside nurse to educate prior to discharge.  RNCM signing off

## 2017-02-08 NOTE — Discharge Summary (Signed)
Nathan Ayers, is a 46 y.o. male  DOB 10-28-1971  MRN [id number].  Admission date:  01/31/2017  Admitting Physician  Demetrios Loll, MD  Discharge Date:  02/08/2017   Primary MD  No PCP Per Patient  Recommendations for primary care physician for things to follow:   Up with Dr. Ola Spurr in 2 weeks Follow-up with Dr. Vickki Muff on April 17.   Admission Diagnosis  HIV (human immunodeficiency virus infection) ([place]) [B20] Wound, open, foot [S91.309A] Elevated troponin I level [R74.8] Sepsis, due to unspecified organism [facility]) [A41.9] Chronic kidney disease on chronic dialysis ([place]) [N18.9, Z99.2]   Discharge Diagnosis  HIV (human immunodeficiency virus infection) ([place]) [B20] Wound, open, foot [S91.309A] Elevated troponin I level [R74.8] Sepsis, due to unspecified organism [facility]) [A41.9] Chronic kidney disease on chronic dialysis ([place]) [N18.9, Z99.2]    Active Problems:   Sepsis ([name])   Malnutrition of moderate degree      Past Medical History:  Diagnosis Date  . Chronic kidney disease on chronic dialysis ([place])   . Diabetes mellitus ([place])   . Diabetic neuropathy ([place])   . HIV infection ([place]athan A[place])   . Hypertension     Past Surgical History:  Procedure Laterality Date  . AMPUTATION TOE Left 02/04/2017   Procedure: AMPUTATION 5TH METATARSAL AND JOINT;  Surgeon: Samara Deist, DPM;  Location: [facility] ORS;  Service: Podiatry;  Laterality: Left;       History of present illness and  Hospital Course:     Kindly see H&P for history of present illness and admission details, please review complete Labs, Consult reports and Test reports for all details in brief  HPI  from the history and physical done on the day of admission 46 year old male patient admitted on March 26 for fever, foul-smelling drainage from left foot  ulcer.   Hospital Course   -1. Sepsis- POA and now resolved.. Sepsis secondary to left ulcer with MRI showing osteomyelitis of fifth metatarsal head status post amputation by podiatry., seen by ID. Discharge home today with home health physical therapy, registered nurse, IV antibiotics with dialysis, Fortaz 2 g IV at each hemodialysis, vancomycin 1 g with each hemodialysis with . Vanco trough 15-20, weekly labs CBC with differential, CMP, Vanco trough, CRP  To fax labs Dr. Blane Ohara office. duration of antibiotics for weeks from March 30, stop date for antibiotics April 28. Labs  2.proximal base of the fifth proximal phalanx.urgery 02/04/2017.Partial fifth ray amputation left foot intraoperative  cultures showed mixed cell species. Discharge home with home with home health . nurse for dressing changes 3 times a week for the left foot. -  3. End Stage renal disease on hemodialysis-Nephrology following. Cont. HD on MWF.   4. Hx of HIV - cont. HAART.   - No acute issue. Appreciate ID input.  5. DM - cont. SSI.   - BS stable.   6. Secondary hyperparathyroidism-continue PhosLo.  7. Essential hypertension-continue losartan, minoxidil, Coreg.          Discharge Condition: stable.   Follow UP  Follow-up Information    Nathan Ayers, DPM. Go on 02/22/2017.   Specialty:  Podiatry Why:  Tuesday at 8:30am for hospital follow-up Contact information: [place] [address] [phone]        Nathan Ramsay, MD. Nathan Ayers on 02/22/2017.   Specialty:  Infectious Diseases Why:  Tuesday at 9:00am for hospital follow-up Contact information: [place] [address] [phone]  Discharge Instructions  and  Discharge Medications    Discharge Instructions    Apply dressing    Complete by:  As directed    Home health for dressing changes.  Cleanse wound with saline.  Apply large bulky dry dressing  (gauze, abd's, kerlex, kling and ace wrap) three times per week.   Face-to-face encounter (required for Medicare/Medicaid patients)    Complete by:  As directed    I Jeramia Saleeby certify that this patient is under my care and that I, or a nurse practitioner or physician's assistant working with me, had a face-to-face encounter that meets the physician face-to-face encounter requirements with this patient on 02/08/2017. The encounter with the patient was in whole, or in part for the following medical condition(s) which is the primary reason for home health care  Diabetic foot infection HTN DMII\ HIV   The encounter with the patient was in whole, or in part, for the following medical condition, which is the primary reason for home health care:  whole   I certify that, based on my findings, the following services are medically necessary home health services:   Nursing Physical therapy     Reason for Medically Necessary Home Health Services:  Skilled Nursing- Complex Wound Care   My clinical findings support the need for the above services:  Pain interferes with ambulation/mobility   Further, I certify that my clinical findings support that this patient is homebound due to:  Pain interferes with ambulation/mobility   Home Health    Complete by:  As directed    To provide the following care/treatments:   PT RN       Allergies as of 02/08/2017   No Known Allergies     Medication List    TAKE these medications   abacavir 300 MG tablet Commonly known as:  ZIAGEN Take 1 tablet by mouth 2 (two) times daily.   aspirin 81 MG chewable tablet Chew 81 mg by mouth daily.   calcium acetate 667 MG capsule Commonly known as:  PHOSLO Take 1 capsule by mouth 3 (three) times daily.   carvedilol 25 MG tablet Commonly known as:  COREG Take 1 tablet by mouth every 12 (twelve) hours.   cefTAZidime 2 g in dextrose 5 % 50 mL Inject 2 g into the vein every Monday, Wednesday, and Friday with  hemodialysis. Start taking on:  02/09/2017   feeding supplement (NEPRO CARB STEADY) Liqd Take 237 mLs by mouth as needed (missed meal during dialysis.).   feeding supplement (PRO-STAT SUGAR FREE 64) Liqd Take 30 mLs by mouth daily. Start taking on:  02/09/2017   hydrALAZINE 25 MG tablet Commonly known as:  APRESOLINE Take 1 tablet by mouth every 8 (eight) hours.   insulin aspart 100 UNIT/ML injection Commonly known as:  novoLOG Inject into the skin 3 (three) times daily with meals.   lamiVUDine 10 MG/ML solution Commonly known as:  EPIVIR Take 2.5 mLs by mouth daily. Take 2.59m daily, after dialysis and on days of dialysis   losartan 100 MG tablet Commonly known as:  COZAAR Take 100 mg by mouth daily.   minoxidil 2.5 MG tablet Commonly known as:  LONITEN Take 2.5 mg by mouth 2 (two) times daily.   ondansetron 4 MG disintegrating tablet Commonly known as:  ZOFRAN-ODT Take 1 tablet by mouth every 8 (eight) hours as needed.   TIVICAY 50 MG tablet Generic drug:  dolutegravir Take 1 tablet by mouth 2 (two) times daily.   vancomycin 1-5  GM/200ML-% Soln Commonly known as:  VANCOCIN Inject 200 mLs (1,000 mg total) into the vein every Monday, Wednesday, and Friday with hemodialysis. Start taking on:  02/09/2017         Diet and Activity recommendation: See Discharge Instructions above   Consults obtained - podiatry, ID, nephrology Major procedures and Radiology Reports - PLEASE review detailed and final reports for all details, in brief -     Mr Foot Left Wo Contrast  Result Date: 02/01/2017 CLINICAL DATA:  Pt has a large wound on his left foot that is apparently been present and gradually worsening for at least weeks if not months. EXAM: MRI OF THE LEFT FOOT WITHOUT CONTRAST TECHNIQUE: Multiplanar, multisequence MR imaging of the left forefoot was performed. No intravenous contrast was administered. COMPARISON:  None. FINDINGS: Bones/Joint/Cartilage Large soft tissue ulcer  overlying the fifth MTP joint. Cortical destruction of the fifth metatarsal head with severe marrow edema most consistent with osteomyelitis. Cortical irregularity of the fifth proximal phalanx with severe edema concerning for osteomyelitis. No other marrow signal abnormality. No fracture or dislocation. Normal alignment. No joint effusion. Ligaments Collateral ligaments are intact.  Lisfranc ligament is intact. Muscles and Tendons Flexor, peroneal and extensor compartment tendons are intact. Soft tissue No fluid collection or hematoma.  No soft tissue mass. IMPRESSION: 1. Large soft tissue ulcer overlying the fifth MTP joint. Findings concerning for osteomyelitis of the fifth metatarsal head and possibly base of the fifth proximal phalanx. Electronically Signed   By: Kathreen Devoid   On: 02/01/2017 14:46   Dg Chest Portable 1 View  Result Date: 01/31/2017 CLINICAL DATA:  Code sepsis EXAM: PORTABLE CHEST 1 VIEW COMPARISON:  None. FINDINGS: Top-normal heart size. Normal mediastinal contour. No pneumothorax. No pleural effusion. Lungs appear clear, with no acute consolidative airspace disease and no pulmonary edema. IMPRESSION: No active disease. Electronically Signed   By: Ilona Sorrel M.D.   On: 01/31/2017 16:08   Dg Foot Complete Left  Result Date: 01/31/2017 CLINICAL DATA:  Ulcer overlying the distal aspect of the fifth metatarsal bone. Rule out osteomyelitis. EXAM: LEFT FOOT - COMPLETE 3+ VIEW COMPARISON:  None. FINDINGS: There is a large also or adjacent to the undersurface of the distal aspect of the fifth metatarsal bone and fifth MTP joints. There is no focal areas of bony destruction. No fractures or dislocation. IMPRESSION: 1. Large ulcer overlying the distal aspect of the fifth metatarsal. No underlying bony destruction identified. Electronically Signed   By: Kerby Moors M.D.   On: 01/31/2017 16:09    Micro Results    Recent Results (from the past 240 hour(s))  Culture, blood (Routine x 2)      Status: None   Collection Time: 01/31/17  3:32 PM  Result Value Ref Range Status   Specimen Description BLOOD LEFT ANTECUBITAL  Final   Special Requests   Final    BOTTLES DRAWN AEROBIC AND ANAEROBIC Blood Culture adequate volume   Culture NO GROWTH 5 DAYS  Final   Report Status 02/05/2017 FINAL  Final  Culture, blood (Routine x 2)     Status: None   Collection Time: 01/31/17  3:32 PM  Result Value Ref Range Status   Specimen Description BLOOD LEFT FOREARM  Final   Special Requests   Final    BOTTLES DRAWN AEROBIC AND ANAEROBIC Blood Culture adequate volume   Culture NO GROWTH 5 DAYS  Final   Report Status 02/05/2017 FINAL  Final  Aerobic/Anaerobic Culture (surgical/deep wound)  Status: Abnormal   Collection Time: 01/31/17  4:00 PM  Result Value Ref Range Status   Specimen Description ABSCESS  Final   Special Requests Immunocompromised  Final   Gram Stain   Final    MODERATE WBC PRESENT,BOTH PMN AND MONONUCLEAR ABUNDANT GRAM NEGATIVE RODS ABUNDANT GRAM POSITIVE COCCI IN PAIRS RARE GRAM POSITIVE RODS Performed at [facility], [place] [address], [place], [place] [address]    Culture (A)  Final    MULTIPLE ORGANISMS PRESENT, NONE PREDOMINANT MIXED ANAEROBIC FLORA PRESENT.  CALL LAB IF FURTHER IID REQUIRED.    Report Status 02/04/2017 FINAL  Final  MRSA PCR Screening     Status: None   Collection Time: 02/01/17  8:26 AM  Result Value Ref Range Status   MRSA by PCR NEGATIVE NEGATIVE Final    Comment:        The GeneXpert MRSA Assay (FDA approved for NASAL specimens only), is one component of a comprehensive MRSA colonization surveillance program. It is not intended to diagnose MRSA infection nor to guide or monitor treatment for MRSA infections.   Aerobic/Anaerobic Culture (surgical/deep wound)     Status: None (Preliminary result)   Collection Time: 02/04/17  1:22 PM  Result Value Ref Range Status   Specimen Description TOE TOE  Final   Special Requests NONE   Final   Gram Stain   Final    FEW WBC PRESENT, PREDOMINANTLY PMN RARE GRAM POSITIVE COCCI IN PAIRS Performed at [facility], [address] [place], [place] [address]    Culture   Final    NORMAL SKIN FLORA AEROBICALLY NO ANAEROBES ISOLATED; CULTURE IN PROGRESS FOR 5 DAYS    Report Status PENDING  Incomplete       Today   Subjective:   Greycen Felter today has no headache,no chest abdominal pain,no new weakness tingling or numbness, feels much better wants to go home today.   Objective:   Blood pressure 139/76, pulse 85, temperature 98.4 F (36.9 C), temperature source Oral, resp. rate 15, height 6' 6"  (1.981 m), weight 97.1 kg (214 lb 1.1 oz), SpO2 100 %.   Intake/Output Summary (Last 24 hours) at 02/08/17 1431 Last data filed at 02/08/17 0900  Gross per 24 hour  Intake              240 ml  Output                0 ml  Net              240 ml    Exam Awake Alert, Oriented x 3, No new F.N deficits, Normal affect Onset.AT,PERRAL Supple Neck,No JVD, No cervical lymphadenopathy appriciated.  Symmetrical Chest wall movement, Good air movement bilaterally, CTAB RRR,No Gallops,Rubs or new Murmurs, No Parasternal Heave +ve B.Sounds, Abd Soft, Non tender, No organomegaly appriciated, No rebound -guarding or rigidity. No Cyanosis, left foot dressing present  Data Review   CBC w Diff:  Lab Results  Component Value Date   WBC 8.2 02/07/2017   HGB 9.1 (L) 02/07/2017   HCT 26.8 (L) 02/07/2017   PLT 294 02/07/2017   LYMPHOPCT 6 01/31/2017   MONOPCT 6 01/31/2017   EOSPCT 0 01/31/2017   BASOPCT 0 01/31/2017    CMP:  Lab Results  Component Value Date   NA 134 (L) 02/07/2017   K 4.5 02/07/2017   CL 98 (L) 02/07/2017   CO2 26 02/07/2017   BUN 65 (H) 02/07/2017  CREATININE 12.08 (H) 02/07/2017   CREATININE 1.08 07/19/2013   PROT 8.9 (H) 01/31/2017   ALBUMIN 2.7 (L) 02/07/2017   BILITOT 0.7 01/31/2017   ALKPHOS 68 01/31/2017   AST 11 (L) 01/31/2017   ALT 10  (L) 01/31/2017  .   Total Time in preparing paper work, data evaluation and todays exam - 52 minutes  Nasrin Lanzo M.D on 02/08/2017 at 2:31 PM    Note: This dictation was prepared with Dragon dictation along with smaller phrase technology. Any transcriptional errors that result from this process are unintentional.

## 2017-02-08 NOTE — Progress Notes (Deleted)
Patient's PPD test that was administered 48 hours prior to today is negative. There is no inderation at the injection site.

## 2017-02-09 DIAGNOSIS — Z5181 Encounter for therapeutic drug level monitoring: Secondary | ICD-10-CM | POA: Insufficient documentation

## 2017-02-09 DIAGNOSIS — A498 Other bacterial infections of unspecified site: Secondary | ICD-10-CM | POA: Insufficient documentation

## 2017-02-09 LAB — AEROBIC/ANAEROBIC CULTURE W GRAM STAIN (SURGICAL/DEEP WOUND): Culture: NORMAL

## 2017-02-10 NOTE — Anesthesia Postprocedure Evaluation (Signed)
Anesthesia Post Note  Patient: Nathan Ayers  Procedure(s) Performed: Procedure(s) (LRB): AMPUTATION 5TH METATARSAL AND JOINT (Left)  Patient location during evaluation: PACU Anesthesia Type: General Level of consciousness: awake and alert Pain management: pain level controlled Vital Signs Assessment: post-procedure vital signs reviewed and stable Respiratory status: spontaneous breathing, nonlabored ventilation, respiratory function stable and patient connected to nasal cannula oxygen Cardiovascular status: blood pressure returned to baseline and stable Postop Assessment: no signs of nausea or vomiting Anesthetic complications: no     Last Vitals:  Vitals:   02/08/17 1008 02/08/17 1426  BP:  139/76  Pulse: 91 85  Resp:  15  Temp:  36.9 C    Last Pain:  Vitals:   02/08/17 1522  TempSrc:   PainSc: 2                  Molli Barrows

## 2017-05-12 ENCOUNTER — Other Ambulatory Visit: Payer: Self-pay | Admitting: Podiatry

## 2017-05-17 ENCOUNTER — Encounter
Admission: RE | Admit: 2017-05-17 | Discharge: 2017-05-17 | Disposition: A | Discharge status: Home / Self Care | Payer: Medicaid Other | Source: Ambulatory Visit | Attending: Podiatry | Admitting: Podiatry

## 2017-05-17 DIAGNOSIS — Z01812 Encounter for preprocedural laboratory examination: Secondary | ICD-10-CM | POA: Diagnosis not present

## 2017-05-17 DIAGNOSIS — M86472 Chronic osteomyelitis with draining sinus, left ankle and foot: Secondary | ICD-10-CM | POA: Diagnosis not present

## 2017-05-17 DIAGNOSIS — Z01818 Encounter for other preprocedural examination: Secondary | ICD-10-CM | POA: Diagnosis not present

## 2017-05-17 DIAGNOSIS — I10 Essential (primary) hypertension: Secondary | ICD-10-CM | POA: Diagnosis not present

## 2017-05-17 HISTORY — DX: Anxiety disorder, unspecified: F41.9

## 2017-05-17 HISTORY — DX: Dyspnea, unspecified: R06.00

## 2017-05-17 HISTORY — DX: Chronic kidney disease, unspecified: N18.9

## 2017-05-17 LAB — CBC
HCT: 32 % — ABNORMAL LOW (ref 40.0–52.0)
Hemoglobin: 10.8 g/dL — ABNORMAL LOW (ref 13.0–18.0)
MCH: 29.1 pg (ref 26.0–34.0)
MCHC: 33.7 g/dL (ref 32.0–36.0)
MCV: 86.4 fL (ref 80.0–100.0)
Platelets: 227 K/uL (ref 150–440)
RBC: 3.71 MIL/uL — ABNORMAL LOW (ref 4.40–5.90)
RDW: 16.9 % — ABNORMAL HIGH (ref 11.5–14.5)
WBC: 8.5 K/uL (ref 3.8–10.6)

## 2017-05-17 LAB — BASIC METABOLIC PANEL WITH GFR
Anion gap: 12 (ref 5–15)
BUN: 51 mg/dL — ABNORMAL HIGH (ref 6–20)
CO2: 32 mmol/L (ref 22–32)
Calcium: 9.3 mg/dL (ref 8.9–10.3)
Chloride: 96 mmol/L — ABNORMAL LOW (ref 101–111)
Creatinine, Ser: 7.95 mg/dL — ABNORMAL HIGH (ref 0.61–1.24)
GFR calc Af Amer: 8 mL/min — ABNORMAL LOW
GFR calc non Af Amer: 7 mL/min — ABNORMAL LOW
Glucose, Bld: 217 mg/dL — ABNORMAL HIGH (ref 65–99)
Potassium: 5.7 mmol/L — ABNORMAL HIGH (ref 3.5–5.1)
Sodium: 140 mmol/L (ref 135–145)

## 2017-05-17 NOTE — Patient Instructions (Signed)
Your procedure is scheduled on: 05/20/17 Fri Report to Same Day Surgery 2nd floor medical mall [facility] Entrance-take elevator on left to 2nd floor.  Check in with surgery information desk.) To find out your arrival time please call [phone] between 1PM - 3PM on   Remember: Instructions that are not followed completely may result in serious medical risk, up to and including death, or upon the discretion of your surgeon and anesthesiologist your surgery may need to be rescheduled.    _x___ 1. Do not eat food or drink liquids after midnight. No gum chewing or                              hard candies.     __x__ 2. No Alcohol for 24 hours before or after surgery.   __x__3. No Smoking for 24 prior to surgery.   ____  4. Bring all medications with you on the day of surgery if instructed.    __x__ 5. Notify your doctor if there is any change in your medical condition     (cold, fever, infections).     Do not wear jewelry, make-up, hairpins, clips or nail polish.  Do not wear lotions, powders, or perfumes. You may wear deodorant.  Do not shave 48 hours prior to surgery. Men may shave face and neck.  Do not bring valuables to the hospital.    [facility] is not responsible for any belongings or valuables.               Contacts, dentures or bridgework may not be worn into surgery.  Leave your suitcase in the car. After surgery it may be brought to your room.  For patients admitted to the hospital, discharge time is determined by your                       treatment team.   Patients discharged the day of surgery will not be allowed to drive home.  You will need someone to drive you home and stay with you the night of your procedure.    Please read over the following fact sheets that you were given:   [facility] Preparing for Surgery and or MRSA Information   _x___ Take anti-hypertensive (unless it includes a diuretic), cardiac, seizure, asthma,     anti-reflux and psychiatric  medicines. These include:  1. abacavir (ZIAGEN  2.carvedilol (COREG  3.losartan (COZAAR  4.lamiVUDine (EPIVIR)  5.minoxidil (LONITEN  6.TIVICAY 50 MG   ____Fleets enema or Magnesium Citrate as directed.   _x___ Use CHG Soap or sage wipes as directed on instruction sheet   ____ Use inhalers on the day of surgery and bring to hospital day of surgery  ____ Stop Metformin and Janumet 2 days prior to surgery.    ____ Take 1/2 of usual insulin dose the night before surgery and none on the morning     surgery.   _x___ Follow recommendations from Cardiologist, Pulmonologist or PCP regarding          stopping Aspirin, Coumadin, Pllavix ,Eliquis, Effient, or Pradaxa, and Pletal.  Stop Aspirin until after surgery.  X____Stop Anti-inflammatories such as Advil, Aleve, Ibuprofen, Motrin, Naproxen, Naprosyn, Goodies powders or aspirin products. OK to take Tylenol and                          Celebrex.   _x___  Stop supplements until after surgery.  But may continue Vitamin D, Vitamin B,       and multivitamin.   ____ Bring C-Pap to the hospital.

## 2017-05-18 ENCOUNTER — Other Ambulatory Visit (INDEPENDENT_AMBULATORY_CARE_PROVIDER_SITE_OTHER): Payer: Self-pay | Admitting: Vascular Surgery

## 2017-05-18 DIAGNOSIS — L97521 Non-pressure chronic ulcer of other part of left foot limited to breakdown of skin: Secondary | ICD-10-CM

## 2017-05-19 ENCOUNTER — Ambulatory Visit (INDEPENDENT_AMBULATORY_CARE_PROVIDER_SITE_OTHER): Payer: Medicaid Other | Admitting: Vascular Surgery

## 2017-05-19 ENCOUNTER — Ambulatory Visit (INDEPENDENT_AMBULATORY_CARE_PROVIDER_SITE_OTHER): Payer: Medicaid Other

## 2017-05-19 ENCOUNTER — Encounter (INDEPENDENT_AMBULATORY_CARE_PROVIDER_SITE_OTHER): Payer: Self-pay | Admitting: Vascular Surgery

## 2017-05-19 DIAGNOSIS — L97521 Non-pressure chronic ulcer of other part of left foot limited to breakdown of skin: Secondary | ICD-10-CM | POA: Diagnosis not present

## 2017-05-19 DIAGNOSIS — I10 Essential (primary) hypertension: Secondary | ICD-10-CM | POA: Diagnosis not present

## 2017-05-19 DIAGNOSIS — B20 Human immunodeficiency virus [HIV] disease: Secondary | ICD-10-CM | POA: Diagnosis not present

## 2017-05-19 DIAGNOSIS — I70245 Atherosclerosis of native arteries of left leg with ulceration of other part of foot: Secondary | ICD-10-CM

## 2017-05-19 DIAGNOSIS — E1052 Type 1 diabetes mellitus with diabetic peripheral angiopathy with gangrene: Secondary | ICD-10-CM | POA: Diagnosis not present

## 2017-05-19 MED ORDER — CEFAZOLIN SODIUM-DEXTROSE 2-4 GM/100ML-% IV SOLN
2.0000 g | INTRAVENOUS | Status: AC
  Administered 2017-05-20: 2 g via INTRAVENOUS

## 2017-05-20 ENCOUNTER — Ambulatory Visit: Payer: Medicaid Other | Admitting: Certified Registered Nurse Anesthetist

## 2017-05-20 ENCOUNTER — Encounter
Admission: RE | Disposition: A | Discharge status: Home / Self Care | Payer: Self-pay | Source: Ambulatory Visit | Attending: Podiatry

## 2017-05-20 ENCOUNTER — Encounter: Payer: Self-pay | Admitting: *Deleted

## 2017-05-20 ENCOUNTER — Ambulatory Visit
Admission: RE | Admit: 2017-05-20 | Discharge: 2017-05-20 | Disposition: A | Discharge status: Home / Self Care | Payer: Medicaid Other | Source: Ambulatory Visit | Attending: Podiatry | Admitting: Podiatry

## 2017-05-20 DIAGNOSIS — B20 Human immunodeficiency virus [HIV] disease: Secondary | ICD-10-CM | POA: Insufficient documentation

## 2017-05-20 DIAGNOSIS — E1069 Type 1 diabetes mellitus with other specified complication: Secondary | ICD-10-CM | POA: Insufficient documentation

## 2017-05-20 DIAGNOSIS — Z992 Dependence on renal dialysis: Secondary | ICD-10-CM | POA: Insufficient documentation

## 2017-05-20 DIAGNOSIS — I10 Essential (primary) hypertension: Secondary | ICD-10-CM | POA: Insufficient documentation

## 2017-05-20 DIAGNOSIS — L97523 Non-pressure chronic ulcer of other part of left foot with necrosis of muscle: Secondary | ICD-10-CM | POA: Insufficient documentation

## 2017-05-20 DIAGNOSIS — I120 Hypertensive chronic kidney disease with stage 5 chronic kidney disease or end stage renal disease: Secondary | ICD-10-CM | POA: Insufficient documentation

## 2017-05-20 DIAGNOSIS — E1022 Type 1 diabetes mellitus with diabetic chronic kidney disease: Secondary | ICD-10-CM | POA: Diagnosis not present

## 2017-05-20 DIAGNOSIS — Z79899 Other long term (current) drug therapy: Secondary | ICD-10-CM | POA: Insufficient documentation

## 2017-05-20 DIAGNOSIS — M86672 Other chronic osteomyelitis, left ankle and foot: Secondary | ICD-10-CM | POA: Diagnosis present

## 2017-05-20 DIAGNOSIS — I70209 Unspecified atherosclerosis of native arteries of extremities, unspecified extremity: Secondary | ICD-10-CM | POA: Insufficient documentation

## 2017-05-20 DIAGNOSIS — L98499 Non-pressure chronic ulcer of skin of other sites with unspecified severity: Secondary | ICD-10-CM

## 2017-05-20 DIAGNOSIS — Z89422 Acquired absence of other left toe(s): Secondary | ICD-10-CM | POA: Diagnosis not present

## 2017-05-20 DIAGNOSIS — Z7982 Long term (current) use of aspirin: Secondary | ICD-10-CM | POA: Diagnosis not present

## 2017-05-20 DIAGNOSIS — E1065 Type 1 diabetes mellitus with hyperglycemia: Secondary | ICD-10-CM | POA: Diagnosis not present

## 2017-05-20 DIAGNOSIS — N186 End stage renal disease: Secondary | ICD-10-CM | POA: Diagnosis not present

## 2017-05-20 DIAGNOSIS — E119 Type 2 diabetes mellitus without complications: Secondary | ICD-10-CM | POA: Insufficient documentation

## 2017-05-20 DIAGNOSIS — Z794 Long term (current) use of insulin: Secondary | ICD-10-CM | POA: Diagnosis not present

## 2017-05-20 DIAGNOSIS — E109 Type 1 diabetes mellitus without complications: Secondary | ICD-10-CM | POA: Insufficient documentation

## 2017-05-20 HISTORY — PX: APPLICATION OF WOUND VAC: SHX5189

## 2017-05-20 HISTORY — PX: INCISION AND DRAINAGE OF WOUND: SHX1803

## 2017-05-20 LAB — POCT I-STAT 4, (NA,K, GLUC, HGB,HCT)
Glucose, Bld: 193 mg/dL — ABNORMAL HIGH (ref 65–99)
HCT: 32 % — ABNORMAL LOW (ref 39.0–52.0)
Hemoglobin: 10.9 g/dL — ABNORMAL LOW (ref 13.0–17.0)
Potassium: 5.3 mmol/L — ABNORMAL HIGH (ref 3.5–5.1)
Sodium: 139 mmol/L (ref 135–145)

## 2017-05-20 LAB — GLUCOSE, CAPILLARY: Glucose-Capillary: 193 mg/dL — ABNORMAL HIGH (ref 65–99)

## 2017-05-20 SURGERY — IRRIGATION AND DEBRIDEMENT WOUND
Anesthesia: General | Laterality: Left | Wound class: Clean Contaminated

## 2017-05-20 MED ORDER — FAMOTIDINE 20 MG PO TABS
20.0000 mg | ORAL_TABLET | Freq: Once | ORAL | Status: AC
  Administered 2017-05-20: 20 mg via ORAL

## 2017-05-20 MED ORDER — LIDOCAINE HCL (PF) 2 % IJ SOLN
INTRAMUSCULAR | Status: AC
Start: 2017-05-20 — End: ?
  Filled 2017-05-20: qty 2

## 2017-05-20 MED ORDER — POVIDONE-IODINE 7.5 % EX SOLN
Freq: Once | CUTANEOUS | Status: DC
  Filled 2017-05-20: qty 118

## 2017-05-20 MED ORDER — SODIUM CHLORIDE 0.9 % IV SOLN
INTRAVENOUS | Status: DC
  Administered 2017-05-20: 08:00:00 via INTRAVENOUS

## 2017-05-20 MED ORDER — ONDANSETRON HCL 4 MG/2ML IJ SOLN
INTRAMUSCULAR | Status: AC
  Filled 2017-05-20: qty 2

## 2017-05-20 MED ORDER — PROMETHAZINE HCL 25 MG/ML IJ SOLN: 6.2500 mg | INTRAMUSCULAR | Status: DC | PRN

## 2017-05-20 MED ORDER — MIDAZOLAM HCL 2 MG/2ML IJ SOLN
INTRAMUSCULAR | Status: AC
Start: 2017-05-20 — End: ?
  Filled 2017-05-20: qty 2

## 2017-05-20 MED ORDER — PROPOFOL 10 MG/ML IV BOLUS
INTRAVENOUS | Status: AC
  Filled 2017-05-20: qty 20

## 2017-05-20 MED ORDER — CEFAZOLIN SODIUM-DEXTROSE 2-4 GM/100ML-% IV SOLN
INTRAVENOUS | Status: AC
  Filled 2017-05-20: qty 100

## 2017-05-20 MED ORDER — FENTANYL CITRATE (PF) 100 MCG/2ML IJ SOLN
INTRAMUSCULAR | Status: DC | PRN
  Administered 2017-05-20: 25 ug via INTRAVENOUS

## 2017-05-20 MED ORDER — EPHEDRINE SULFATE 50 MG/ML IJ SOLN
INTRAMUSCULAR | Status: AC
Start: 2017-05-20 — End: ?
  Filled 2017-05-20: qty 1

## 2017-05-20 MED ORDER — PROPOFOL 10 MG/ML IV BOLUS
INTRAVENOUS | Status: DC | PRN
  Administered 2017-05-20: 200 mg via INTRAVENOUS

## 2017-05-20 MED ORDER — LIDOCAINE-EPINEPHRINE 1 %-1:100000 IJ SOLN
INTRAMUSCULAR | Status: AC
  Filled 2017-05-20: qty 1

## 2017-05-20 MED ORDER — DEXAMETHASONE SODIUM PHOSPHATE 10 MG/ML IJ SOLN
INTRAMUSCULAR | Status: AC
  Filled 2017-05-20: qty 1

## 2017-05-20 MED ORDER — EPHEDRINE SULFATE 50 MG/ML IJ SOLN
INTRAMUSCULAR | Status: DC | PRN
  Administered 2017-05-20 (×2): 10 mg via INTRAVENOUS

## 2017-05-20 MED ORDER — DEXAMETHASONE SODIUM PHOSPHATE 10 MG/ML IJ SOLN
INTRAMUSCULAR | Status: DC | PRN
  Administered 2017-05-20: 10 mg via INTRAVENOUS

## 2017-05-20 MED ORDER — FAMOTIDINE 20 MG PO TABS
ORAL_TABLET | ORAL | Status: AC
  Administered 2017-05-20: 20 mg via ORAL
  Filled 2017-05-20: qty 1

## 2017-05-20 MED ORDER — FENTANYL CITRATE (PF) 100 MCG/2ML IJ SOLN: 25.0000 ug | INTRAMUSCULAR | Status: DC | PRN

## 2017-05-20 MED ORDER — FENTANYL CITRATE (PF) 100 MCG/2ML IJ SOLN
INTRAMUSCULAR | Status: AC
  Filled 2017-05-20: qty 2

## 2017-05-20 MED ORDER — LIDOCAINE HCL (CARDIAC) 20 MG/ML IV SOLN
INTRAVENOUS | Status: DC | PRN
  Administered 2017-05-20: 60 mg via INTRAVENOUS

## 2017-05-20 MED ORDER — ONDANSETRON HCL 4 MG/2ML IJ SOLN
INTRAMUSCULAR | Status: DC | PRN
  Administered 2017-05-20: 4 mg via INTRAVENOUS

## 2017-05-20 MED ORDER — MIDAZOLAM HCL 2 MG/2ML IJ SOLN
INTRAMUSCULAR | Status: DC | PRN
  Administered 2017-05-20: 2 mg via INTRAVENOUS

## 2017-05-20 MED ORDER — PHENYLEPHRINE HCL 10 MG/ML IJ SOLN
INTRAMUSCULAR | Status: DC | PRN
  Administered 2017-05-20 (×2): 200 ug via INTRAVENOUS
  Administered 2017-05-20 (×2): 100 ug via INTRAVENOUS

## 2017-05-20 MED ORDER — PHENYLEPHRINE HCL 10 MG/ML IJ SOLN
INTRAMUSCULAR | Status: AC
  Filled 2017-05-20: qty 1

## 2017-05-20 SURGICAL SUPPLY — 62 items
BANDAGE ACE 4X5 VEL STRL LF (GAUZE/BANDAGES/DRESSINGS) IMPLANT
BANDAGE STRETCH 3X4.1 STRL (GAUZE/BANDAGES/DRESSINGS) IMPLANT
BLADE OSC/SAGITTAL MD 5.5X18 (BLADE) IMPLANT
BLADE OSCILLATING/SAGITTAL (BLADE)
BLADE SURG 15 STRL LF DISP TIS (BLADE) IMPLANT
BLADE SURG 15 STRL SS (BLADE)
BLADE SW THK.38XMED LNG THN (BLADE) IMPLANT
BNDG COHESIVE 4X5 TAN STRL (GAUZE/BANDAGES/DRESSINGS) IMPLANT
BNDG COHESIVE 6X5 TAN STRL LF (GAUZE/BANDAGES/DRESSINGS) IMPLANT
BNDG ESMARK 4X12 TAN STRL LF (GAUZE/BANDAGES/DRESSINGS) IMPLANT
BNDG GAUZE 4.5X4.1 6PLY STRL (MISCELLANEOUS) IMPLANT
CANISTER SUCT 1200ML W/VALVE (MISCELLANEOUS) ×2 IMPLANT
CANISTER SUCT 3000ML PPV (MISCELLANEOUS) IMPLANT
CUFF TOURN 18 STER (MISCELLANEOUS) ×2 IMPLANT
CUFF TOURN DUAL PL 12 NO SLV (MISCELLANEOUS) IMPLANT
DRAPE FLUOR MINI C-ARM 54X84 (DRAPES) IMPLANT
DRAPE XRAY CASSETTE 23X24 (DRAPES) IMPLANT
DRESSING ALLEVYN 4X4 (MISCELLANEOUS) IMPLANT
DURAPREP 26ML APPLICATOR (WOUND CARE) IMPLANT
ELECT REM PT RETURN 9FT ADLT (ELECTROSURGICAL) ×2
ELECTRODE REM PT RTRN 9FT ADLT (ELECTROSURGICAL) ×1 IMPLANT
GAUZE PACKING 1/4 X5 YD (GAUZE/BANDAGES/DRESSINGS) IMPLANT
GAUZE PACKING IODOFORM 1X5 (MISCELLANEOUS) IMPLANT
GAUZE PETRO XEROFOAM 1X8 (MISCELLANEOUS) IMPLANT
GAUZE SPONGE 4X4 12PLY STRL (GAUZE/BANDAGES/DRESSINGS) IMPLANT
GAUZE STRETCH 2X75IN STRL (MISCELLANEOUS) IMPLANT
GLOVE BIO SURGEON STRL SZ7.5 (GLOVE) ×4 IMPLANT
GLOVE INDICATOR 8.0 STRL GRN (GLOVE) ×4 IMPLANT
GOWN STRL REUS W/ TWL LRG LVL3 (GOWN DISPOSABLE) ×2 IMPLANT
GOWN STRL REUS W/TWL LRG LVL3 (GOWN DISPOSABLE) ×2
GOWN STRL REUS W/TWL MED LVL3 (GOWN DISPOSABLE) IMPLANT
HANDPIECE INTERPULSE COAX TIP (DISPOSABLE)
HANDPIECE VERSAJET DEBRIDEMENT (MISCELLANEOUS) ×2 IMPLANT
IV NS 1000ML (IV SOLUTION) ×1
IV NS 1000ML BAXH (IV SOLUTION) ×1 IMPLANT
KIT DRSG VAC SLVR GRANUFM (MISCELLANEOUS) ×2 IMPLANT
KIT RM TURNOVER STRD PROC AR (KITS) ×2 IMPLANT
LABEL OR SOLS (LABEL) IMPLANT
NEEDLE FILTER BLUNT 18X 1/2SAF (NEEDLE)
NEEDLE FILTER BLUNT 18X1 1/2 (NEEDLE) IMPLANT
NEEDLE HYPO 25X1 1.5 SAFETY (NEEDLE) IMPLANT
NS IRRIG 500ML POUR BTL (IV SOLUTION) ×2 IMPLANT
PACK EXTREMITY ARMC (MISCELLANEOUS) ×2 IMPLANT
PAD ABD DERMACEA PRESS 5X9 (GAUZE/BANDAGES/DRESSINGS) IMPLANT
PENCIL ELECTRO HAND CTR (MISCELLANEOUS) IMPLANT
RASP SM TEAR CROSS CUT (RASP) IMPLANT
SET HNDPC FAN SPRY TIP SCT (DISPOSABLE) IMPLANT
SOL .9 NS 3000ML IRR  AL (IV SOLUTION)
SOL .9 NS 3000ML IRR UROMATIC (IV SOLUTION) IMPLANT
STOCKINETTE IMPERVIOUS 9X36 MD (GAUZE/BANDAGES/DRESSINGS) ×2 IMPLANT
SUT ETHILON 2 0 FS 18 (SUTURE) IMPLANT
SUT ETHILON 4-0 (SUTURE)
SUT ETHILON 4-0 FS2 18XMFL BLK (SUTURE)
SUT VIC AB 3-0 SH 27 (SUTURE)
SUT VIC AB 3-0 SH 27X BRD (SUTURE) IMPLANT
SUT VIC AB 4-0 FS2 27 (SUTURE) IMPLANT
SUTURE ETHLN 4-0 FS2 18XMF BLK (SUTURE) IMPLANT
SWAB CULTURE AMIES ANAERIB BLU (MISCELLANEOUS) IMPLANT
SYR 3ML LL SCALE MARK (SYRINGE) IMPLANT
SYRINGE 10CC LL (SYRINGE) IMPLANT
TRAY PREP VAG/GEN (MISCELLANEOUS) IMPLANT
WND VAC CANISTER 500ML (MISCELLANEOUS) IMPLANT

## 2017-05-20 NOTE — Progress Notes (Signed)
States no pain and wants to go home

## 2017-05-20 NOTE — Progress Notes (Signed)
Wound vac intact and draining

## 2017-05-20 NOTE — Anesthesia Preprocedure Evaluation (Signed)
Anesthesia Evaluation  Patient identified by MRN, date of birth, ID band Patient awake    Reviewed: Allergy & Precautions, NPO status , Patient's Chart, lab work & pertinent test results, reviewed documented beta blocker date and time   History of Anesthesia Complications Negative for: history of anesthetic complications  Airway Mallampati: II  TM Distance: >3 FB Neck ROM: full    Dental  (+) Chipped, Dental Advidsory Given   Pulmonary neg pulmonary ROS,           Cardiovascular Exercise Tolerance: Good hypertension, Pt. on medications and Pt. on home beta blockers (-) angina(-) CAD, (-) Past MI, (-) Cardiac Stents and (-) CABG (-) dysrhythmias (-) Valvular Problems/Murmurs     Neuro/Psych negative neurological ROS  negative psych ROS   GI/Hepatic negative GI ROS, Neg liver ROS,   Endo/Other  diabetes, Type 2  Renal/GU ESRF and DialysisRenal disease  negative genitourinary   Musculoskeletal   Abdominal   Peds  Hematology negative hematology ROS (+)   Anesthesia Other Findings Past Medical History: No date: Anxiety No date: Chronic kidney disease (CKD), active medical management  without dialysis     Comment:  Mon, Wed and Fri No date: Chronic kidney disease on chronic dialysis ([place]) No date: Diabetes mellitus ([place]) No date: Diabetic neuropathy ([place]) No date: Dyspnea No date: HIV infection ([place]) No date: Hypertension   Reproductive/Obstetrics negative OB ROS                             Anesthesia Physical  Anesthesia Plan  ASA: III  Anesthesia Plan: General   Post-op Pain Management:    Induction: Intravenous  PONV Risk Score and Plan: 2 and Ondansetron and Dexamethasone  Airway Management Planned: LMA  Additional Equipment:   Intra-op Plan:   Post-operative Plan: Extubation in OR  Informed Consent: I have reviewed the patients History and Physical, chart, labs and  discussed the procedure including the risks, benefits and alternatives for the proposed anesthesia with the patient or authorized representative who has indicated his/her understanding and acceptance.   Dental Advisory Given  Plan Discussed with: CRNA  Anesthesia Plan Comments:         Anesthesia Quick Evaluation

## 2017-05-20 NOTE — Anesthesia Procedure Notes (Signed)
Procedure Name: LMA Insertion Date/Time: 05/20/2017 9:02 AM Performed by: Eben Burow Pre-anesthesia Checklist: Patient identified, Emergency Drugs available, Suction available, Patient being monitored and Timeout performed Patient Re-evaluated:Patient Re-evaluated prior to induction Oxygen Delivery Method: Circle system utilized Preoxygenation: Pre-oxygenation with 100% oxygen Induction Type: IV induction Ventilation: Mask ventilation without difficulty LMA: LMA inserted LMA Size: 5.0 Number of attempts: 1 Placement Confirmation: positive ETCO2 Tube secured with: Tape Dental Injury: Teeth and Oropharynx as per pre-operative assessment

## 2017-05-20 NOTE — Anesthesia Post-op Follow-up Note (Cosign Needed)
Anesthesia QCDR form completed.        

## 2017-05-20 NOTE — Anesthesia Postprocedure Evaluation (Signed)
Anesthesia Post Note  Patient: Nathan Ayers  Procedure(s) Performed: Procedure(s) (LRB): IRRIGATION AND DEBRIDEMENT WOUND (Left) APPLICATION OF WOUND VAC (Left)  Patient location during evaluation: PACU Anesthesia Type: General Level of consciousness: awake and alert Pain management: pain level controlled Vital Signs Assessment: post-procedure vital signs reviewed and stable Respiratory status: spontaneous breathing, nonlabored ventilation, respiratory function stable and patient connected to nasal cannula oxygen Cardiovascular status: blood pressure returned to baseline and stable Postop Assessment: no signs of nausea or vomiting Anesthetic complications: no     Last Vitals:  Vitals:   05/20/17 1034 05/20/17 1100  BP: 127/73 (!) 147/77  Pulse: 84 80  Resp: 16   Temp: (!) 36 C     Last Pain:  Vitals:   05/20/17 1034  TempSrc: Temporal                 Martha Clan

## 2017-05-20 NOTE — Op Note (Addendum)
Operative note   Surgeon:Erikah Thumm    Assistant:none    Preop diagnosis: Full-thickness ulceration dorsal and lateral left foot to bone.  110 cmsq total size    Postop diagnosis: Same    Procedure: 1.Excisional debridement to bone and including bone dorsal and lower left foot, 110 cm total  2. Application wound VAC left foot    EBL: Minimal    Anesthesia:local and general    Hemostasis: Epinephrine infiltrated along the incision site.    Specimen: None    Complications: None    Operative indications:Nathan Ayers is an 46 y.o. that presents today for surgical intervention.  The risks/benefits/alternatives/complications have been discussed and consent has been given.    Procedure:  Patient was brought into the OR and placed on the operating table in thesupine position. The wound was infiltrated with a total of approximately 15 cc of lidocaine with epinephrine. After anesthesia was obtained theleft lower extremity was prepped and draped in usual sterile fashion.  Attention was directed to the dorsolateral left foot. Full-thickness large ulcerations were noted. First ulceration is only lateral aspect of the foot where previous fifth ray amputation has been performed. Excisional debridement down to and including the stump of the fifth metatarsal base was performed to good healthy bleeding tissue with the use of a versa jet. Fibrotic tissue was noted with good granular tissue also mixed. This ulceration measured 8 cm x 4 cm. Second ulceration was on the dorsal aspect of the foot measuring 13 x 6 cm. Full-thickness excisional debridement performed in this region as well with versa jet. Good bleeding was noted. Pressure was applied and hemostasis obtained. At this time a wound VAC was applied with a silver wound VAC pad with good suction at 125 mmHg continuous. Bulky sterile bandage was applied.    Patient tolerated the procedure and anesthesia well.  Was transported from the OR to the  PACU with all vital signs stable and vascular status intact. To be discharged per routine protocol.  Will follow up in approximately 1 week in the outpatient clinic.

## 2017-05-20 NOTE — Transfer of Care (Signed)
Immediate Anesthesia Transfer of Care Note  Patient: Nathan Ayers  Procedure(s) Performed: Procedure(s): IRRIGATION AND DEBRIDEMENT WOUND (Left) APPLICATION OF WOUND VAC (Left)  Patient Location: PACU  Anesthesia Type:general  Level of Consciousness: drowsy  Airway & Oxygen Therapy: Patient Spontanous Breathing and Patient connected to nasal cannula oxygen  Post-op Assessment: Report given to RN and Post -op Vital signs reviewed and stable  Post vital signs: Reviewed and stable  Last Vitals:  Vitals:   05/20/17 0759 05/20/17 0954  BP: 131/74 (!) 127/92  Pulse: 89 82  Resp: 18 17  Temp: (!) 36.3 C (P) 36.4 C    Last Pain:  Vitals:   05/20/17 0759  TempSrc: Oral         Complications: No apparent anesthesia complications

## 2017-05-20 NOTE — H&P (Signed)
HISTORY AND PHYSICAL INTERVAL NOTE:  05/20/2017  8:46 AM  Nathan Ayers  has presented today for surgery, with the diagnosis of Chronic osteomyelitis of left foot with draining sinus - M86.472.  The various methods of treatment have been discussed with the patient.  No guarantees were given.  After consideration of risks, benefits and other options for treatment, the patient has consented to surgery.  I have reviewed the patients' chart and labs.    Patient Vitals for the past 24 hrs:  BP Temp Temp src Pulse Resp SpO2 Height Weight  05/20/17 0759 131/74 (!) 97.4 F (36.3 C) Oral 89 18 100 % 6' 6"  (1.981 m) 103.4 kg (228 lb)    A history and physical examination was performed in my office.  The patient was reexamined.  There have been no changes to this history and physical examination.  Nathan Ayers A

## 2017-05-20 NOTE — Discharge Instructions (Signed)
Keep wound VAC intact. Allow home health nursing to change. Remain nonweightbearing. Keep dressing clean and dry. Use crutches.  [place] DR. [place]   1. Take your medication as prescribed.  Pain medication should be taken only as needed.  2. Keep the dressing clean, dry and intact.  3. Keep your foot elevated above the heart level for the first 48 hours.  4. Walking to the bathroom and brief periods of walking are acceptable, unless we have instructed you to be non-weight bearing.  5. Always wear your post-op shoe when walking.  Always use your crutches if you are to be non-weight bearing.  6. Do not take a shower.  7. Every hour you are awake:  - Bend your knee 15 times. - Flex foot 15 times - Massage calf 15 times  8. Call [facility] [phone]) if any of the following problems occur: - You develop a temperature or fever. - The bandage becomes saturated with blood. - Medication does not stop your pain. - Injury of the foot occurs. - Any symptoms of infection including redness, odor, or red streaks running from wound.

## 2017-05-20 NOTE — Progress Notes (Signed)
MRN : [id number]  Nathan Ayers is a 46 y.o. (1971-08-11) male who presents with chief complaint of  Chief Complaint  Patient presents with  . New Evaluation    ABI/ART, PVD  .  History of Present Illness: The patient is seen for evaluation of painful lower extremities and diminished pulses associated with ulceration of the Left foot.  He is seen at the request of Dr. Vickki Muff who is already debrided a large amount of necrotic tissue from the left foot. Apparently, he underwent amputation of the fifth toe for gangrene this area subsequently dehisced and the patient was attempting his own dressing changes. It is likely that he wrapped his foot too tight and subsequently caused necrosis on the dorsum of the left foot over a large area of the mid foot. The patient notes the ulcer has been present for multiple weeks and feels that it has not been improving.  It is very painful and has had some drainage.  No specific history of trauma noted by the patient.  The patient denies fever or chills.  the patient does have diabetes which has been difficult to control.  Patient notes prior to the ulcer developing the extremities were not painful and he denies any problems with ambulation or activity and the discomfort is very consistent day today.   The patient denies rest pain or dangling of an extremity off the side of the bed during the night for relief. No prior interventions or surgeries.  No history of back problems or DJD of the lumbar sacral spine.   The patient denies amaurosis fugax or recent TIA symptoms. There are no recent neurological changes noted. The patient denies history of DVT, PE or superficial thrombophlebitis. The patient denies recent episodes of angina or shortness of breath.   ABIs right equals 1.19 with triphasic Doppler signals and left equals 1.17 with hyperemic Doppler signals. Toe tracings are strongly pulsatile.  Duplex ultrasound of the left lobe 70 arterial system  demonstrates hyperemic signal throughout but the velocities are uniform and there is no evidence of a hemodynamically significant lesion.  Current Meds  Medication Sig  . abacavir (ZIAGEN) 300 MG tablet Take 300 mg by mouth 2 (two) times daily.   . Amino Acids-Protein Hydrolys (FEEDING SUPPLEMENT, PRO-STAT SUGAR FREE 64,) LIQD Take 30 mLs by mouth daily. (Patient taking differently: Take 30 mLs by mouth every Monday, Wednesday, and Friday. )  . calcium acetate (PHOSLO) 667 MG capsule Take 1,334 mg by mouth 3 (three) times daily.   . carvedilol (COREG) 25 MG tablet Take 25 mg by mouth 2 (two) times daily.   . cefTAZidime 2 g in dextrose 5 % 50 mL Inject 2 g into the vein every Monday, Wednesday, and Friday with hemodialysis.  Marland Kitchen insulin aspart (NOVOLOG) 100 UNIT/ML injection Inject 2-18 Units into the skin 3 (three) times daily with meals. ONLY TAKES IF BLOOD SUGAR OVER 200 SLIDING SCALE  201-250 = 2-6 UNITS 251-300= 10 UNITS 301-350=10-12 UNITS 351-400= 15-18 UNITS  OVER 400= 18 UNITS  . lamiVUDine (EPIVIR) 10 MG/ML solution Take 2.5 mLs by mouth at bedtime.   Marland Kitchen losartan (COZAAR) 100 MG tablet Take 100 mg by mouth daily.  . metroNIDAZOLE (FLAGYL) 500 MG tablet Take 500 mg by mouth 2 (two) times daily.  . minoxidil (LONITEN) 2.5 MG tablet Take 2.5 mg by mouth See admin instructions. TAKES TWICE DAILY, EXCEPT FOR THE AM DOSE ON DIALYSIS DAYS; MON, WED, FRI  . mirtazapine (REMERON) 7.5  MG tablet Take 7.5 mg by mouth at bedtime.  . Nutritional Supplements (FEEDING SUPPLEMENT, NEPRO CARB STEADY,) LIQD Take 237 mLs by mouth as needed (missed meal during dialysis.).  Marland Kitchen ondansetron (ZOFRAN-ODT) 4 MG disintegrating tablet Take 4 mg by mouth every 8 (eight) hours as needed for nausea or vomiting.   Marland Kitchen SANTYL ointment Apply 1 application topically daily. APPLIES TO FOOT USING GAUZE THEN WRAPS FOOT  . TIVICAY 50 MG tablet Take 50 mg by mouth 2 (two) times daily.   . Turmeric 450 MG CAPS Take 450 mg by  mouth 2 (two) times daily as needed (PAIN).  Marland Kitchen vancomycin (VANCOCIN) 1-5 GM/200ML-% SOLN Inject 200 mLs (1,000 mg total) into the vein every Monday, Wednesday, and Friday with hemodialysis.    Past Medical History:  Diagnosis Date  . Anxiety   . Chronic kidney disease (CKD), active medical management without dialysis    Mon, Wed and Fri  . Chronic kidney disease on chronic dialysis ([place])   . Diabetes mellitus ([place])   . Diabetic neuropathy ([place])   . Dyspnea   . HIV infection ([place])   . Hypertension     Past Surgical History:  Procedure Laterality Date  . AMPUTATION TOE Left 02/04/2017   Procedure: AMPUTATION 5TH METATARSAL AND JOINT;  Surgeon: Samara Deist, DPM;  Location: [facility] ORS;  Service: Podiatry;  Laterality: Left;  . AV FISTULA PLACEMENT      Social History Social History  Substance Use Topics  . Smoking status: Never Smoker  . Smokeless tobacco: Never Used  . Alcohol use No    Family History Family History  Problem Relation Age of Onset  . Diabetes Mother   . Hypertension Mother   No family history of bleeding/clotting disorders, porphyria or autoimmune disease   Allergies  Allergen Reactions  . Lac Bovis Other (See Comments)    PLEASE DO NOT GIVE PATIENT MILK PRODUCT. PATIENT IS LACTOSE INTOLERANT.      REVIEW OF SYSTEMS (Negative unless checked)  Constitutional: [] Weight loss  [] Fever  [] Chills Cardiac: [] Chest pain   [] Chest pressure   [] Palpitations   [] Shortness of breath when laying flat   [x] Shortness of breath with exertion. Vascular:  [] Pain in legs with walking   [] Pain in legs at rest  [] History of DVT   [] Phlebitis   [] Swelling in legs   [] Varicose veins   [x] Non-healing ulcers Pulmonary:   [] Uses home oxygen   [] Productive cough   [] Hemoptysis   [] Wheeze  [] COPD   [] Asthma Neurologic:  [] Dizziness   [] Seizures   [] History of stroke   [] History of TIA  [] Aphasia   [] Vissual changes   [] Weakness or numbness in arm   [] Weakness or numbness in  leg Musculoskeletal:   [] Joint swelling   [] Joint pain   [] Low back pain Hematologic:  [] Easy bruising  [] Easy bleeding   [] Hypercoagulable state   [] Anemic Gastrointestinal:  [] Diarrhea   [] Vomiting  [] Gastroesophageal reflux/heartburn   [] Difficulty swallowing. Genitourinary:  [] Chronic kidney disease   [] Difficult urination  [] Frequent urination   [] Blood in urine Skin:  [] Rashes   [x] Ulcers  Psychological:  [] History of anxiety   []  History of major depression.  Physical Examination  Vitals:   05/19/17 1553  BP: (!) 148/83  Pulse: 84  Resp: 17  Weight: 102.5 kg (226 lb)  Height: 6' 6"  (1.981 m)   Body mass index is 26.12 kg/m. Gen: WD/WN, NAD Head: [place]/AT, No temporalis wasting.  Ear/Nose/Throat: Hearing grossly intact, nares w/o erythema or  drainage, poor dentition Eyes: PER, EOMI, sclera nonicteric.  Neck: Supple, no masses.  No bruit or JVD.  Pulmonary:  Good air movement, clear to auscultation bilaterally, no use of accessory muscles.  Cardiac: RRR, normal S1, S2, no Murmurs. Vascular: The left foot shows a large ulceration on the dorsal surface over the midfoot which encompasses two thirds of the foot itself there is also an area laterally over the fifth ray.  Both of these ulcers are richly granulated. The dorsal ulcer has slough in the midportion but surrounded by Rich very healthy-appearing granulation. There is no odor there is no drainage. He has significant trophic nail changes noted as well. Vessel Right Left  Radial Palpable Palpable  PT Palpable Palpable  DP Palpable Unable to palpate because of the ulcer   Gastrointestinal: soft, non-distended. No guarding/no peritoneal signs.  Musculoskeletal: M/S 5/5 throughout.  No deformity or atrophy.  Neurologic: CN 2-12 intact. Pain and light touch intact in extremities.  Symmetrical.  Speech is fluent. Motor exam as listed above. Psychiatric: Judgment intact, Mood & affect appropriate for pt's clinical  situation. Dermatologic: No rashes or ulcers noted.  No changes consistent with cellulitis. Lymph : No Cervical lymphadenopathy, no lichenification or skin changes of chronic lymphedema.  CBC Lab Results  Component Value Date   WBC 8.5 05/17/2017   HGB 10.9 (L) 05/20/2017   HCT 32.0 (L) 05/20/2017   MCV 86.4 05/17/2017   PLT 227 05/17/2017    BMET    Component Value Date/Time   NA 139 05/20/2017 0829   K 5.3 (H) 05/20/2017 0829   CL 96 (L) 05/17/2017 0820   CO2 32 05/17/2017 0820   GLUCOSE 193 (H) 05/20/2017 0829   BUN 51 (H) 05/17/2017 0820   CREATININE 7.95 (H) 05/17/2017 0820   CREATININE 1.08 07/19/2013 1631   CALCIUM 9.3 05/17/2017 0820   GFRNONAA 7 (L) 05/17/2017 0820   GFRNONAA 85 07/19/2013 1631   GFRAA 8 (L) 05/17/2017 0820   GFRAA >89 07/19/2013 1631   Estimated Creatinine Clearance: 15.3 mL/min (A) (by C-G formula based on SCr of 7.95 mg/dL (H)).  COAG Lab Results  Component Value Date   INR 1.23 01/31/2017    Radiology No results found.   Assessment/Plan 1. Atherosclerosis of native artery of left lower extremity with ulceration of other part of foot ([place]) Recommend:  The patient appears to have some mild small vessel disease consistent with his diabetes and his subsequent nonhealing of his fifth toe amputation. I do not find evidence of macrovascular disease that would necessitate angiography or intervention.  Although his wounds are certainly very large at this point they appear to be granulating quite richly there is no odor and I believe there is hope that he will heal this.  Noninvasive studies including ABI's of the legs and left lobes from the arterial duplex do not identify critical vascular problems.  ABIs right equals 1.19 with triphasic Doppler signals and left equals 1.17 with hyperemic Doppler signals. Toe tracings are strongly pulsatile.  Duplex ultrasound of the left lobe 70 arterial system demonstrates hyperemic signal throughout but  the velocities are uniform and there is no evidence of a hemodynamically significant lesion.  The patient should continue with his plan for further debridement in the operating room tomorrow and application of the [facility] dressing change as I believe this will help him right a bit.  The patient should continue his antiplatelet therapy and aggressive treatment of the lipid abnormalities.  Once the patient is  healed he should begin wearing graduated compression socks 15-20 mmHg strength to control her mild edema.  Patient will follow-up with me on a PRN basis   2. Type 1 diabetes mellitus with diabetic peripheral angiopathy with gangrene (HCC) Continue hypoglycemic medications as already ordered, these medications have been reviewed and there are no changes at this time.  Hgb A1C to be monitored as already arranged by primary service   3. Essential hypertension Continue antihypertensive medications as already ordered, these medications have been reviewed and there are no changes at this time.   4. HIV disease ([place]) Continue antiviral medications as already ordered, these medications have been reviewed and there are no changes at this time.     Hortencia Pilar, MD  05/20/2017 3:45 PM

## 2017-05-21 ENCOUNTER — Encounter: Payer: Self-pay | Admitting: Podiatry

## 2017-06-17 DIAGNOSIS — N2581 Secondary hyperparathyroidism of renal origin: Secondary | ICD-10-CM | POA: Insufficient documentation

## 2017-09-06 ENCOUNTER — Encounter (INDEPENDENT_AMBULATORY_CARE_PROVIDER_SITE_OTHER): Payer: Self-pay | Admitting: Vascular Surgery

## 2017-12-06 ENCOUNTER — Encounter (INDEPENDENT_AMBULATORY_CARE_PROVIDER_SITE_OTHER): Payer: Self-pay

## 2017-12-06 ENCOUNTER — Ambulatory Visit (INDEPENDENT_AMBULATORY_CARE_PROVIDER_SITE_OTHER): Payer: Self-pay | Admitting: Vascular Surgery

## 2018-01-05 ENCOUNTER — Other Ambulatory Visit (INDEPENDENT_AMBULATORY_CARE_PROVIDER_SITE_OTHER): Payer: Self-pay | Admitting: Vascular Surgery

## 2018-01-05 ENCOUNTER — Ambulatory Visit (INDEPENDENT_AMBULATORY_CARE_PROVIDER_SITE_OTHER): Payer: Medicare Other | Admitting: Vascular Surgery

## 2018-01-05 ENCOUNTER — Encounter (INDEPENDENT_AMBULATORY_CARE_PROVIDER_SITE_OTHER): Payer: Self-pay | Admitting: Vascular Surgery

## 2018-01-05 ENCOUNTER — Ambulatory Visit (INDEPENDENT_AMBULATORY_CARE_PROVIDER_SITE_OTHER): Payer: Medicare Other

## 2018-01-05 VITALS — BP 140/82 | HR 97 | Resp 16 | Ht 78.0 in | Wt 240.0 lb

## 2018-01-05 DIAGNOSIS — E1052 Type 1 diabetes mellitus with diabetic peripheral angiopathy with gangrene: Secondary | ICD-10-CM | POA: Diagnosis not present

## 2018-01-05 DIAGNOSIS — I10 Essential (primary) hypertension: Secondary | ICD-10-CM

## 2018-01-05 DIAGNOSIS — T829XXA Unspecified complication of cardiac and vascular prosthetic device, implant and graft, initial encounter: Secondary | ICD-10-CM | POA: Diagnosis not present

## 2018-01-05 DIAGNOSIS — Z992 Dependence on renal dialysis: Secondary | ICD-10-CM

## 2018-01-05 DIAGNOSIS — N186 End stage renal disease: Secondary | ICD-10-CM | POA: Diagnosis not present

## 2018-01-05 NOTE — Progress Notes (Signed)
Subjective:    Patient ID: Nathan Ayers, male    DOB: 25-May-1971, 47 y.o.   MRN: [id number] Chief Complaint  Patient presents with  . Follow-up    Ultrasound   The patient presents at the request of his dialysis center.  As per the patient, he was having some cannulation issues approximately 1 month ago however since then he has not experienced any problems with dialysis. The patient underwent a duplex ultrasound of the AV access which was notable for a patent fistula without any significant hemodynamic stenosis. Hemodialysis doppler flow is 1158. The patient denies any issues with hemodialysis such as cannulation problems, increased bleeding, decrease in doppler flow or recirculation. The patient also denies any fistula skin breakdown, pain, edema, pallor or ulceration of the arm / hand.  The patient denies any uremic symptoms.  The patient denies any fever, nausea or vomiting.   Review of Systems  Constitutional: Negative.   HENT: Negative.   Eyes: Negative.   Respiratory: Negative.   Cardiovascular: Negative.   Gastrointestinal: Negative.   Endocrine: Negative.   Genitourinary: Negative.   Musculoskeletal: Negative.   Skin: Negative.   Allergic/Immunologic: Negative.   Neurological: Negative.   Hematological: Negative.   Psychiatric/Behavioral: Negative.       Objective:   Physical Exam  Constitutional: He is oriented to person, place, and time. He appears well-developed and well-nourished. No distress.  HENT:  Head: Normocephalic and atraumatic.  Eyes: Conjunctivae are normal. Pupils are equal, round, and reactive to light.  Neck: Normal range of motion.  Cardiovascular: Normal rate, regular rhythm, normal heart sounds and intact distal pulses.  Pulses:      Radial pulses are 2+ on the right side, and 2+ on the left side.  Right upper extremity radiocephalic AV fistula: Good bruit and thrill.  Skin is intact.  Pulmonary/Chest: Effort normal and breath sounds normal.    Musculoskeletal: Normal range of motion. He exhibits no edema.  Neurological: He is alert and oriented to person, place, and time.  Skin: He is not diaphoretic.  Psychiatric: He has a normal mood and affect. His behavior is normal. Judgment and thought content normal.  Vitals reviewed.  BP 140/82   Pulse 97   Resp 16   Ht 6' 6"  (1.981 m)   Wt 240 lb (108.9 kg)   BMI 27.73 kg/m   Past Medical History:  Diagnosis Date  . Anxiety   . Chronic kidney disease (CKD), active medical management without dialysis    Mon, Wed and Fri  . Chronic kidney disease on chronic dialysis ([place])   . Diabetes mellitus ([place])   . Diabetic neuropathy ([place])   . Dyspnea   . HIV infection ([place])   . Hypertension    Social History   Socioeconomic History  . Marital status: Single    Spouse name: Not on file  . Number of children: Not on file  . Years of education: Not on file  . Highest education level: Not on file  Social Needs  . Financial resource strain: Not on file  . Food insecurity - worry: Not on file  . Food insecurity - inability: Not on file  . Transportation needs - medical: Not on file  . Transportation needs - non-medical: Not on file  Occupational History  . Not on file  Tobacco Use  . Smoking status: Never Smoker  . Smokeless tobacco: Never Used  Substance and Sexual Activity  . Alcohol use: No  . Drug use:  No  . Sexual activity: Not Currently    Partners: Male    Birth control/protection: Condom  Other Topics Concern  . Not on file  Social History Narrative  . Not on file   Past Surgical History:  Procedure Laterality Date  . AMPUTATION TOE Left 02/04/2017   Procedure: AMPUTATION 5TH METATARSAL AND JOINT;  Surgeon: Samara Deist, DPM;  Location: [facility] ORS;  Service: Podiatry;  Laterality: Left;  . APPLICATION OF WOUND VAC Left 05/20/2017   Procedure: APPLICATION OF WOUND VAC;  Surgeon: Samara Deist, DPM;  Location: [facility] ORS;  Service: Podiatry;  Laterality: Left;  . AV  FISTULA PLACEMENT    . INCISION AND DRAINAGE OF WOUND Left 05/20/2017   Procedure: IRRIGATION AND DEBRIDEMENT WOUND;  Surgeon: Samara Deist, DPM;  Location: [facility] ORS;  Service: Podiatry;  Laterality: Left;   Family History  Problem Relation Age of Onset  . Diabetes Mother   . Hypertension Mother    Allergies  Allergen Reactions  . Lac Bovis Other (See Comments)    PLEASE DO NOT GIVE PATIENT MILK PRODUCT. PATIENT IS LACTOSE INTOLERANT.       Assessment & Plan:  The patient presents at the request of his dialysis center.  As per the patient, he was having some cannulation issues approximately 1 month ago however since then he has not experienced any problems with dialysis. The patient underwent a duplex ultrasound of the AV access which was notable for a patent fistula without any significant hemodynamic stenosis. Hemodialysis doppler flow is 1158. The patient denies any issues with hemodialysis such as cannulation problems, increased bleeding, decrease in doppler flow or recirculation. The patient also denies any fistula skin breakdown, pain, edema, pallor or ulceration of the arm / hand.  The patient denies any uremic symptoms.  The patient denies any fever, nausea or vomiting.  1. ESRD on dialysis ([place]) - Stable Studies reviewed with patient. The patient is doing well and currently has adequate dialysis access.  The patient denies any issues with dialysis at this time Duplex ultrasound of the AV access shows a patent access with no evidence of hemodynamically significant strictures or stenosis.  We will hold off on a fistulogram for now however if the patient should start to experience any issues the dialysis center/the patient should call her office and we will move forward with the fistulogram The patient should continue to have duplex ultrasounds of the dialysis access every 6 months. The patient was instructed to call the office in the interim if any issues with dialysis access /  doppler flow, pain, edema, pallor, fistula skin breakdown or ulceration of the arm / hand occur. The patient expressed their understanding.  - VAS US DUPLEX DIALYSIS ACCESS (AVF,AVG); Future  2. Type 1 diabetes mellitus with diabetic peripheral angiopathy with gangrene (HCC) - Stable Encouraged good control as its slows the progression of atherosclerotic disease  3. Essential hypertension - Stable Encouraged good control as its slows the progression of atherosclerotic disease  Current Outpatient Medications on File Prior to Visit  Medication Sig Dispense Refill  . abacavir (ZIAGEN) 300 MG tablet Take 300 mg by mouth 2 (two) times daily.     . Amino Acids-Protein Hydrolys (FEEDING SUPPLEMENT, PRO-STAT SUGAR FREE 64,) LIQD Take 30 mLs by mouth daily. (Patient taking differently: Take 30 mLs by mouth every Monday, Wednesday, and Friday. ) 900 mL 0  . calcium acetate (PHOSLO) 667 MG capsule Take 1,334 mg by mouth 3 (three) times daily.   3  .  carvedilol (COREG) 25 MG tablet Take 25 mg by mouth 2 (two) times daily.   9  . lamiVUDine (EPIVIR) 10 MG/ML solution Take 2.5 mLs by mouth at bedtime.   3  . losartan (COZAAR) 100 MG tablet Take 100 mg by mouth 1 day or 1 dose.    . minoxidil (LONITEN) 2.5 MG tablet Take 2.5 mg by mouth See admin instructions. TAKES TWICE DAILY, EXCEPT FOR THE AM DOSE ON DIALYSIS DAYS; MON, WED, FRI  3  . Nutritional Supplements (FEEDING SUPPLEMENT, NEPRO CARB STEADY,) LIQD Take 237 mLs by mouth as needed (missed meal during dialysis.). 30 Can 0  . TIVICAY 50 MG tablet Take 50 mg by mouth 2 (two) times daily.   3  . aspirin EC 81 MG tablet Take 81 mg by mouth 3 (three) times a week.    . cefTAZidime 2 g in dextrose 5 % 50 mL Inject 2 g into the vein every Monday, Wednesday, and Friday with hemodialysis. (Patient not taking: Reported on 01/05/2018) 12 ampule 0  . insulin aspart (NOVOLOG) 100 UNIT/ML injection Inject 2-18 Units into the skin 3 (three) times daily with meals.  ONLY TAKES IF BLOOD SUGAR OVER 200 SLIDING SCALE  201-250 = 2-6 UNITS 251-300= 10 UNITS 301-350=10-12 UNITS 351-400= 15-18 UNITS  OVER 400= 18 UNITS    . losartan (COZAAR) 100 MG tablet Take 100 mg by mouth daily.    . mirtazapine (REMERON) 7.5 MG tablet Take 7.5 mg by mouth at bedtime.  3  . ondansetron (ZOFRAN-ODT) 4 MG disintegrating tablet Take 4 mg by mouth every 8 (eight) hours as needed for nausea or vomiting.     Marland Kitchen SANTYL ointment Apply 1 application topically daily. APPLIES TO FOOT USING GAUZE THEN WRAPS FOOT  2  . Turmeric 450 MG CAPS Take 450 mg by mouth 2 (two) times daily as needed (PAIN).    Marland Kitchen vancomycin (VANCOCIN) 1-5 GM/200ML-% SOLN Inject 200 mLs (1,000 mg total) into the vein every Monday, Wednesday, and Friday with hemodialysis. (Patient not taking: Reported on 01/05/2018) 4000 mL 4   No current facility-administered medications on file prior to visit.    There are no Patient Instructions on file for this visit. No Follow-up on file.  Jaquisha Frech A Chan Sheahan, PA-C

## 2018-02-07 ENCOUNTER — Other Ambulatory Visit: Payer: Self-pay | Admitting: Podiatry

## 2018-02-10 ENCOUNTER — Other Ambulatory Visit: Payer: Self-pay

## 2018-02-10 ENCOUNTER — Encounter
Admission: RE | Admit: 2018-02-10 | Discharge: 2018-02-10 | Disposition: A | Discharge status: Home / Self Care | Payer: Medicare Other | Source: Ambulatory Visit | Attending: Podiatry | Admitting: Podiatry

## 2018-02-10 DIAGNOSIS — I10 Essential (primary) hypertension: Secondary | ICD-10-CM | POA: Insufficient documentation

## 2018-02-10 DIAGNOSIS — R9431 Abnormal electrocardiogram [ECG] [EKG]: Secondary | ICD-10-CM | POA: Diagnosis not present

## 2018-02-10 DIAGNOSIS — Z01810 Encounter for preprocedural cardiovascular examination: Secondary | ICD-10-CM | POA: Insufficient documentation

## 2018-02-10 DIAGNOSIS — Z01812 Encounter for preprocedural laboratory examination: Secondary | ICD-10-CM | POA: Insufficient documentation

## 2018-02-10 LAB — BASIC METABOLIC PANEL
Anion gap: 10 (ref 5–15)
BUN: 30 mg/dL — ABNORMAL HIGH (ref 6–20)
CHLORIDE: 92 mmol/L — AB (ref 101–111)
CO2: 34 mmol/L — ABNORMAL HIGH (ref 22–32)
Calcium: 9 mg/dL (ref 8.9–10.3)
Creatinine, Ser: 6.05 mg/dL — ABNORMAL HIGH (ref 0.61–1.24)
GFR calc Af Amer: 12 mL/min — ABNORMAL LOW (ref >60)
GFR calc non Af Amer: 10 mL/min — ABNORMAL LOW (ref >60)
GLUCOSE: 274 mg/dL — AB (ref 65–99)
POTASSIUM: 3.8 mmol/L (ref 3.5–5.1)
SODIUM: 136 mmol/L (ref 135–145)

## 2018-02-10 NOTE — Patient Instructions (Signed)
Your procedure is scheduled on: February 17, 2018 FRIDAY Report to Day Surgery on the 2nd floor of the [facility]. To find out your arrival time, please call [phone] between 1PM - 3PM on: Thursday February 16, 2018  REMEMBER: Instructions that are not followed completely may result in serious medical risk, up to and including death; or upon the discretion of your surgeon and anesthesiologist your surgery may need to be rescheduled.  Do not eat food after midnight the night before your procedure.  No gum chewing, lozengers or hard candies.  You may however, drink CLEAR liquids up to 2 hours before you are scheduled to arrive for your surgery. Do not drink anything within 2 hours of the start of your surgery.  Clear liquids include: - water    Do NOT drink anything that is not on this list.  Type 1 and Type 2 diabetics should only drink water.  No Alcohol for 24 hours before or after surgery.  No Smoking including e-cigarettes for 24 hours prior to surgery.  No chewable tobacco products for at least 6 hours prior to surgery.  No nicotine patches on the day of surgery.  On the morning of surgery brush your teeth with toothpaste and water, you may rinse your mouth with mouthwash if you wish. Do not swallow any toothpaste or mouthwash.  Notify your doctor if there is any change in your medical condition (cold, fever, infection).  Do not wear jewelry, make-up, hairpins, clips or nail polish.  Do not wear lotions, powders, or perfumes. You may wear deodorant.  Do not shave 48 hours prior to surgery. Men may shave face and neck.  Contacts and dentures may not be worn into surgery.  Do not bring valuables to the hospital, including drivers license, insurance or credit cards.  [facility] is not responsible for any belongings or valuables.   TAKE THESE MEDICATIONS THE MORNING OF SURGERY: CARVEDILOL AND ABACAVIR IF CASE SCHEDULED AFTERNOON   Use CHG Soap  as directed on  instruction sheet.  Take 1/2 of usual insulin dose the night before surgery and none on the morning of surgery.  Follow recommendations from Cardiologist, Pulmonologist or PCP regarding stopping Aspirin, Coumadin, Plavix, Eliquis, Pradaxa, or Pletal.  Stop Anti-inflammatories (NSAIDS) such as Advil, Aleve, Ibuprofen, Motrin, Naproxen, Naprosyn and Aspirin based products such as Excedrin, Goodys Powder, BC Powder. (May take Tylenol or Acetaminophen if needed.)  Stop NOW  ANY OVER THE COUNTER  TUMERIC supplements until after surgery. (May continue Vitamin D, Vitamin B, and multivitamin.)  Wear comfortable clothing (specific to your surgery type) to the hospital.  Plan for stool softeners for home use.  If you are being discharged the day of surgery, you will not be allowed to drive home. You will need a responsible adult to drive you home and stay with you that night.   If you are taking public transportation, you will need to have a responsible adult with you. Please confirm with your physician that it is acceptable to use public transportation.   Please call [phone] if you have any questions about these instructions.

## 2018-02-13 NOTE — Pre-Procedure Instructions (Signed)
EKG OK BY DR Ronelle Nigh

## 2018-02-16 MED ORDER — CEFAZOLIN SODIUM-DEXTROSE 2-4 GM/100ML-% IV SOLN
2.0000 g | INTRAVENOUS | Status: AC
  Administered 2018-02-17: 2 g via INTRAVENOUS
  Filled 2018-02-16: qty 100

## 2018-02-17 ENCOUNTER — Ambulatory Visit
Admission: RE | Admit: 2018-02-17 | Discharge: 2018-02-17 | Disposition: A | Discharge status: Home / Self Care | Payer: Medicare Other | Source: Ambulatory Visit | Attending: Podiatry | Admitting: Podiatry

## 2018-02-17 ENCOUNTER — Encounter
Admission: RE | Disposition: A | Discharge status: Home / Self Care | Payer: Self-pay | Source: Ambulatory Visit | Attending: Podiatry

## 2018-02-17 ENCOUNTER — Ambulatory Visit: Payer: Medicare Other | Admitting: Anesthesiology

## 2018-02-17 ENCOUNTER — Encounter: Payer: Self-pay | Admitting: *Deleted

## 2018-02-17 ENCOUNTER — Other Ambulatory Visit: Payer: Self-pay

## 2018-02-17 DIAGNOSIS — I120 Hypertensive chronic kidney disease with stage 5 chronic kidney disease or end stage renal disease: Secondary | ICD-10-CM | POA: Diagnosis not present

## 2018-02-17 DIAGNOSIS — E1069 Type 1 diabetes mellitus with other specified complication: Secondary | ICD-10-CM | POA: Insufficient documentation

## 2018-02-17 DIAGNOSIS — N186 End stage renal disease: Secondary | ICD-10-CM | POA: Insufficient documentation

## 2018-02-17 DIAGNOSIS — Z7982 Long term (current) use of aspirin: Secondary | ICD-10-CM | POA: Insufficient documentation

## 2018-02-17 DIAGNOSIS — M86472 Chronic osteomyelitis with draining sinus, left ankle and foot: Secondary | ICD-10-CM | POA: Diagnosis not present

## 2018-02-17 DIAGNOSIS — L97529 Non-pressure chronic ulcer of other part of left foot with unspecified severity: Secondary | ICD-10-CM | POA: Diagnosis not present

## 2018-02-17 DIAGNOSIS — E1040 Type 1 diabetes mellitus with diabetic neuropathy, unspecified: Secondary | ICD-10-CM | POA: Insufficient documentation

## 2018-02-17 DIAGNOSIS — Z992 Dependence on renal dialysis: Secondary | ICD-10-CM | POA: Diagnosis not present

## 2018-02-17 DIAGNOSIS — Z21 Asymptomatic human immunodeficiency virus [HIV] infection status: Secondary | ICD-10-CM | POA: Insufficient documentation

## 2018-02-17 DIAGNOSIS — E1065 Type 1 diabetes mellitus with hyperglycemia: Secondary | ICD-10-CM | POA: Diagnosis not present

## 2018-02-17 DIAGNOSIS — E1022 Type 1 diabetes mellitus with diabetic chronic kidney disease: Secondary | ICD-10-CM | POA: Insufficient documentation

## 2018-02-17 DIAGNOSIS — Z89422 Acquired absence of other left toe(s): Secondary | ICD-10-CM | POA: Diagnosis not present

## 2018-02-17 DIAGNOSIS — Z79899 Other long term (current) drug therapy: Secondary | ICD-10-CM | POA: Diagnosis not present

## 2018-02-17 DIAGNOSIS — Z794 Long term (current) use of insulin: Secondary | ICD-10-CM | POA: Insufficient documentation

## 2018-02-17 DIAGNOSIS — E1051 Type 1 diabetes mellitus with diabetic peripheral angiopathy without gangrene: Secondary | ICD-10-CM | POA: Insufficient documentation

## 2018-02-17 HISTORY — PX: BONE EXCISION: SHX6730

## 2018-02-17 LAB — GLUCOSE, CAPILLARY
Glucose-Capillary: 166 mg/dL — ABNORMAL HIGH (ref 65–99)
Glucose-Capillary: 179 mg/dL — ABNORMAL HIGH (ref 65–99)

## 2018-02-17 LAB — POCT I-STAT 4, (NA,K, GLUC, HGB,HCT)
Glucose, Bld: 176 mg/dL — ABNORMAL HIGH (ref 65–99)
HCT: 38 % — ABNORMAL LOW (ref 39.0–52.0)
Hemoglobin: 12.9 g/dL — ABNORMAL LOW (ref 13.0–17.0)
Potassium: 5.1 mmol/L (ref 3.5–5.1)
Sodium: 136 mmol/L (ref 135–145)

## 2018-02-17 SURGERY — BONE EXCISION
Anesthesia: General | Site: Foot | Laterality: Left | Wound class: Dirty or Infected

## 2018-02-17 MED ORDER — PROPOFOL 500 MG/50ML IV EMUL
INTRAVENOUS | Status: DC | PRN
  Administered 2018-02-17: 60 ug/kg/min via INTRAVENOUS

## 2018-02-17 MED ORDER — LIDOCAINE HCL (CARDIAC) 20 MG/ML IV SOLN
INTRAVENOUS | Status: DC | PRN
  Administered 2018-02-17: 100 mg via INTRAVENOUS

## 2018-02-17 MED ORDER — FAMOTIDINE 20 MG PO TABS
ORAL_TABLET | ORAL | Status: AC
  Filled 2018-02-17: qty 1

## 2018-02-17 MED ORDER — ONDANSETRON HCL 4 MG PO TABS: 4.0000 mg | ORAL_TABLET | Freq: Four times a day (QID) | ORAL | Status: DC | PRN

## 2018-02-17 MED ORDER — MIDAZOLAM HCL 2 MG/2ML IJ SOLN
INTRAMUSCULAR | Status: DC | PRN
  Administered 2018-02-17: 2 mg via INTRAVENOUS

## 2018-02-17 MED ORDER — PROPOFOL 500 MG/50ML IV EMUL
INTRAVENOUS | Status: AC
  Filled 2018-02-17: qty 50

## 2018-02-17 MED ORDER — BUPIVACAINE HCL (PF) 0.5 % IJ SOLN
INTRAMUSCULAR | Status: DC | PRN
  Administered 2018-02-17: 5 mL

## 2018-02-17 MED ORDER — SODIUM CHLORIDE 0.9 % IV SOLN
INTRAVENOUS | Status: DC
  Administered 2018-02-17: 09:00:00 via INTRAVENOUS

## 2018-02-17 MED ORDER — ONDANSETRON HCL 4 MG/2ML IJ SOLN
INTRAMUSCULAR | Status: DC | PRN
  Administered 2018-02-17: 4 mg via INTRAVENOUS

## 2018-02-17 MED ORDER — ONDANSETRON HCL 4 MG/2ML IJ SOLN: 4.0000 mg | Freq: Four times a day (QID) | INTRAMUSCULAR | Status: DC | PRN

## 2018-02-17 MED ORDER — FAMOTIDINE 20 MG PO TABS
20.0000 mg | ORAL_TABLET | Freq: Once | ORAL | Status: AC
  Administered 2018-02-17: 20 mg via ORAL

## 2018-02-17 MED ORDER — LIDOCAINE-EPINEPHRINE 1 %-1:100000 IJ SOLN
INTRAMUSCULAR | Status: DC | PRN
  Administered 2018-02-17: 5 mL

## 2018-02-17 MED ORDER — MIDAZOLAM HCL 2 MG/2ML IJ SOLN
INTRAMUSCULAR | Status: AC
  Filled 2018-02-17: qty 2

## 2018-02-17 MED ORDER — PROPOFOL 10 MG/ML IV BOLUS
INTRAVENOUS | Status: DC | PRN
  Administered 2018-02-17: 200 mg via INTRAVENOUS

## 2018-02-17 MED ORDER — POVIDONE-IODINE 7.5 % EX SOLN
Freq: Once | CUTANEOUS | Status: AC
  Administered 2018-02-17: 09:00:00 via TOPICAL
  Filled 2018-02-17: qty 118

## 2018-02-17 MED ORDER — FENTANYL CITRATE (PF) 100 MCG/2ML IJ SOLN
INTRAMUSCULAR | Status: AC
  Filled 2018-02-17: qty 2

## 2018-02-17 MED ORDER — FENTANYL CITRATE (PF) 100 MCG/2ML IJ SOLN: 25.0000 ug | INTRAMUSCULAR | Status: DC | PRN

## 2018-02-17 MED ORDER — CEFAZOLIN SODIUM-DEXTROSE 2-3 GM-%(50ML) IV SOLR
INTRAVENOUS | Status: AC
  Filled 2018-02-17: qty 50

## 2018-02-17 MED ORDER — DEXMEDETOMIDINE HCL 200 MCG/2ML IV SOLN
INTRAVENOUS | Status: DC | PRN
  Administered 2018-02-17 (×2): 8 ug via INTRAVENOUS

## 2018-02-17 MED ORDER — PROMETHAZINE HCL 25 MG/ML IJ SOLN: 6.2500 mg | INTRAMUSCULAR | Status: DC | PRN

## 2018-02-17 MED ORDER — FENTANYL CITRATE (PF) 100 MCG/2ML IJ SOLN
INTRAMUSCULAR | Status: DC | PRN
  Administered 2018-02-17 (×2): 50 ug via INTRAVENOUS

## 2018-02-17 SURGICAL SUPPLY — 46 items
BAG COUNTER SPONGE EZ (MISCELLANEOUS) ×2 IMPLANT
BANDAGE ACE 4X5 VEL STRL LF (GAUZE/BANDAGES/DRESSINGS) ×2 IMPLANT
BANDAGE ELASTIC 4 LF NS (GAUZE/BANDAGES/DRESSINGS) ×2 IMPLANT
BANDAGE ELASTIC 6 LF NS (GAUZE/BANDAGES/DRESSINGS) ×4 IMPLANT
BLADE MED AGGRESSIVE (BLADE) ×2 IMPLANT
BLADE OSC/SAGITTAL MD 5.5X18 (BLADE) ×2 IMPLANT
BLADE SURG 15 STRL LF DISP TIS (BLADE) ×2 IMPLANT
BLADE SURG 15 STRL SS (BLADE) ×2
BLADE SURG MINI STRL (BLADE) ×2 IMPLANT
BNDG CONFORM 3 STRL LF (GAUZE/BANDAGES/DRESSINGS) ×2 IMPLANT
BNDG ESMARK 4X12 TAN STRL LF (GAUZE/BANDAGES/DRESSINGS) ×2 IMPLANT
BNDG STRETCH 4X75 STRL LF (GAUZE/BANDAGES/DRESSINGS) ×2 IMPLANT
CANISTER SUCT 1200ML W/VALVE (MISCELLANEOUS) ×2 IMPLANT
DRAPE FLUOR MINI C-ARM 54X84 (DRAPES) ×2 IMPLANT
DURAPREP 26ML APPLICATOR (WOUND CARE) ×2 IMPLANT
ELECT REM PT RETURN 9FT ADLT (ELECTROSURGICAL) ×2
ELECTRODE REM PT RTRN 9FT ADLT (ELECTROSURGICAL) ×1 IMPLANT
GAUZE PETRO XEROFOAM 1X8 (MISCELLANEOUS) ×2 IMPLANT
GAUZE SPONGE 4X4 12PLY STRL (GAUZE/BANDAGES/DRESSINGS) ×2 IMPLANT
GAUZE XEROFORM 4X4 STRL (GAUZE/BANDAGES/DRESSINGS) ×2 IMPLANT
GLOVE BIO SURGEON STRL SZ7.5 (GLOVE) ×2 IMPLANT
GLOVE INDICATOR 8.0 STRL GRN (GLOVE) ×2 IMPLANT
GOWN STRL REUS W/ TWL LRG LVL3 (GOWN DISPOSABLE) ×2 IMPLANT
GOWN STRL REUS W/TWL LRG LVL3 (GOWN DISPOSABLE) ×2
NEEDLE FILTER BLUNT 18X 1/2SAF (NEEDLE) ×1
NEEDLE FILTER BLUNT 18X1 1/2 (NEEDLE) ×1 IMPLANT
NS IRRIG 500ML POUR BTL (IV SOLUTION) ×2 IMPLANT
PACK EXTREMITY ARMC (MISCELLANEOUS) ×2 IMPLANT
PAD ABD DERMACEA PRESS 5X9 (GAUZE/BANDAGES/DRESSINGS) ×2 IMPLANT
PAD CAST CTTN 4X4 STRL (SOFTGOODS) ×1 IMPLANT
PAD PREP 24X41 OB/GYN DISP (PERSONAL CARE ITEMS) ×2 IMPLANT
PADDING CAST COTTON 4X4 STRL (SOFTGOODS) ×1
PENCIL ELECTRO HAND CTR (MISCELLANEOUS) ×2 IMPLANT
PIN BALLS 3/8 F/.054-.062 WIRE (MISCELLANEOUS) ×2 IMPLANT
RASP SM TEAR CROSS CUT (RASP) ×2 IMPLANT
SPONGE XRAY 4X4 16PLY STRL (MISCELLANEOUS) ×2 IMPLANT
STOCKINETTE STRL 6IN 960660 (GAUZE/BANDAGES/DRESSINGS) ×2 IMPLANT
STRAP SAFETY 5IN WIDE (MISCELLANEOUS) ×2 IMPLANT
STRIP CLOSURE SKIN 1/4X4 (GAUZE/BANDAGES/DRESSINGS) ×2 IMPLANT
SUT ETHILON 5-0 FS-2 18 BLK (SUTURE) ×2 IMPLANT
SUT VIC AB 4-0 FS2 27 (SUTURE) ×2 IMPLANT
SUT VIC AB 4-0 RB1 27 (SUTURE) ×1
SUT VIC AB 4-0 RB1 27X BRD (SUTURE) ×1 IMPLANT
SYR 10ML LL (SYRINGE) ×2 IMPLANT
WIRE Z .045 C-WIRE SPADE TIP (WIRE) ×4 IMPLANT
WIRE Z .062 C-WIRE SPADE TIP (WIRE) ×4 IMPLANT

## 2018-02-17 NOTE — Anesthesia Post-op Follow-up Note (Signed)
Anesthesia QCDR form completed.        

## 2018-02-17 NOTE — Anesthesia Postprocedure Evaluation (Signed)
Anesthesia Post Note  Patient: WYLAN GENTZLER  Procedure(s) Performed: BONE EXCISION METATARSAL (Left Foot)  Patient location during evaluation: PACU Anesthesia Type: General Level of consciousness: awake and alert Pain management: pain level controlled Vital Signs Assessment: post-procedure vital signs reviewed and stable Respiratory status: spontaneous breathing, nonlabored ventilation, respiratory function stable and patient connected to nasal cannula oxygen Cardiovascular status: blood pressure returned to baseline and stable Postop Assessment: no apparent nausea or vomiting Anesthetic complications: no     Last Vitals:  Vitals:   02/17/18 1107 02/17/18 1121  BP: (!) 172/87 (!) 144/80  Pulse: 71   Resp:    Temp: (!) 36.4 C   SpO2: 100%     Last Pain:  Vitals:   02/17/18 1107  TempSrc:   PainSc: 0-No pain                 Martha Clan

## 2018-02-17 NOTE — Discharge Instructions (Signed)
AMBULATORY SURGERY  DISCHARGE INSTRUCTIONS   1) The drugs that you were given will stay in your system until tomorrow so for the next 24 hours you should not:  A) Drive an automobile B) Make any legal decisions C) Drink any alcoholic beverage   2) You may resume regular meals tomorrow.  Today it is better to start with liquids and gradually work up to solid foods.  You may eat anything you prefer, but it is better to start with liquids, then soup and crackers, and gradually work up to solid foods.   3) Please notify your doctor immediately if you have any unusual bleeding, trouble breathing, redness and pain at the surgery site, drainage, fever, or pain not relieved by medication.    4) Additional Instructions:        Please contact your physician with any problems or Same Day Surgery at [phone], Monday through Friday 6 am to 4 pm, or [place] at [facility] number at [phone].AMBULATORY SURGERY

## 2018-02-17 NOTE — OR Nursing (Signed)
Taking sips of soda tolerated well

## 2018-02-17 NOTE — Anesthesia Procedure Notes (Signed)
Procedure Name: LMA Insertion Date/Time: 02/17/2018 9:00 AM Performed by: Patience Musca., CRNA Pre-anesthesia Checklist: Patient identified, Patient being monitored, Timeout performed, Emergency Drugs available and Suction available Patient Re-evaluated:Patient Re-evaluated prior to induction Oxygen Delivery Method: Circle system utilized Preoxygenation: Pre-oxygenation with 100% oxygen Induction Type: IV induction Ventilation: Mask ventilation without difficulty LMA: LMA inserted LMA Size: 5.0 Tube type: Oral Number of attempts: 1 Placement Confirmation: positive ETCO2 and breath sounds checked- equal and bilateral Tube secured with: Tape Dental Injury: Teeth and Oropharynx as per pre-operative assessment

## 2018-02-17 NOTE — H&P (Signed)
HISTORY AND PHYSICAL INTERVAL NOTE:  02/17/2018  8:32 AM  Nathan Ayers  has presented today for surgery, with the diagnosis of Chronic osteomyelitis-left foot.  The various methods of treatment have been discussed with the patient.  No guarantees were given.  After consideration of risks, benefits and other options for treatment, the patient has consented to surgery.  I have reviewed the patients' chart and labs.    Patient Vitals for the past 24 hrs:  BP Temp Temp src Pulse Resp SpO2 Height Weight  02/17/18 0808 134/80 (!) 97.2 F (36.2 C) Temporal 84 12 100 % 6' 6"  (1.981 m) 104.3 kg (230 lb)    A history and physical examination was performed in my office.  The patient was reexamined.  There have been no changes to this history and physical examination.  [name]

## 2018-02-17 NOTE — Transfer of Care (Signed)
Immediate Anesthesia Transfer of Care Note  Patient: Nathan Ayers  Procedure(s) Performed: BONE EXCISION METATARSAL (Left Foot)  Patient Location: PACU  Anesthesia Type:General  Level of Consciousness: awake, alert , oriented and patient cooperative  Airway & Oxygen Therapy: Patient Spontanous Breathing  Post-op Assessment: Report given to RN, Post -op Vital signs reviewed and stable and Patient moving all extremities  Post vital signs: Reviewed and stable  Last Vitals:  Vitals Value Taken Time  BP    Temp    Pulse    Resp 16 02/17/2018  9:39 AM  SpO2    Vitals shown include unvalidated device data.  Last Pain:  Vitals:   02/17/18 0808  TempSrc: Temporal  PainSc: 0-No pain         Complications: No apparent anesthesia complications

## 2018-02-17 NOTE — Anesthesia Preprocedure Evaluation (Addendum)
Anesthesia Evaluation  Patient identified by MRN, date of birth, ID band Patient awake    Reviewed: Allergy & Precautions, NPO status , Patient's Chart, lab work & pertinent test results, reviewed documented beta blocker date and time   History of Anesthesia Complications Negative for: history of anesthetic complications  Airway Mallampati: II  TM Distance: >3 FB Neck ROM: full    Dental  (+) Chipped, Dental Advidsory Given   Pulmonary neg pulmonary ROS,           Cardiovascular Exercise Tolerance: Good hypertension, Pt. on medications and Pt. on home beta blockers (-) angina+ Peripheral Vascular Disease  (-) CAD, (-) Past MI, (-) Cardiac Stents and (-) CABG (-) dysrhythmias (-) Valvular Problems/Murmurs     Neuro/Psych PSYCHIATRIC DISORDERS Anxiety negative neurological ROS     GI/Hepatic negative GI ROS, Neg liver ROS,   Endo/Other  diabetes, Type 2  Renal/GU ESRF and DialysisRenal disease  negative genitourinary   Musculoskeletal   Abdominal   Peds  Hematology  (+) HIV,   Anesthesia Other Findings Past Medical History: No date: Anxiety No date: Chronic kidney disease (CKD), active medical management  without dialysis     Comment:  Mon, Wed and Fri No date: Chronic kidney disease on chronic dialysis ([place]) No date: Diabetes mellitus ([place]) No date: Diabetic neuropathy ([place]) No date: Dyspnea No date: HIV infection ([place]) No date: Hypertension   Reproductive/Obstetrics negative OB ROS                            Anesthesia Physical  Anesthesia Plan  ASA: III  Anesthesia Plan: General   Post-op Pain Management:    Induction: Intravenous  PONV Risk Score and Plan: 2 and Ondansetron and Dexamethasone  Airway Management Planned: LMA  Additional Equipment:   Intra-op Plan:   Post-operative Plan: Extubation in OR  Informed Consent: I have reviewed the patients History and  Physical, chart, labs and discussed the procedure including the risks, benefits and alternatives for the proposed anesthesia with the patient or authorized representative who has indicated his/her understanding and acceptance.   Dental Advisory Given  Plan Discussed with: CRNA  Anesthesia Plan Comments:         Anesthesia Quick Evaluation

## 2018-02-17 NOTE — Op Note (Signed)
Operative note   Surgeon:Ashiyah Pavlak Lawyer: None    Preop diagnosis: Osteomyelitis left fifth metatarsal    Postop diagnosis: Same    Procedure: Partial excision left fifth metatarsal    EBL: Minimal    Anesthesia:local and general    Hemostasis: Ankle tourniquet inflated to 200 mmHg for 10 minutes    Specimen: Bone for pathology and deep wound culture    Complications: None    Operative indications:Nathan Ayers is an 47 y.o. that presents today for surgical intervention.  The risks/benefits/alternatives/complications have been discussed and consent has been given.    Procedure:  Patient was brought into the OR and placed on the operating table in thesupine position. After anesthesia was obtained theleft lower extremity was prepped and draped in usual sterile fashion.  Attention was directed to the lateral aspect of the foot where the open ulceration was noted at the level of what would be the midshaft of the fifth metatarsal.  Fluoroscopy revealed erosive changes of previous excision of the distal one half of the fifth metatarsal and toe.  Incision was taken down to bone.  Incision was carried back to the base of the fifth metatarsal.  At this time just distal to the base of the fifth metatarsal leaving the peroneal brevis insertion intact a through and through osteotomy was created.  The distal portion of the bone was then excised full-thickness.  A deep wound culture was performed to the wound site.  Wound was flushed with copious amounts of irrigation.  Tourniquet was deflated.  All bleeders were Bovie cauterized.  Closure was then performed with a 3-0 nylon.  Small ulcerative site was left open for drainage.  Large bulky sterile dressing was applied.    Patient tolerated the procedure and anesthesia well.  Was transported from the OR to the PACU with all vital signs stable and vascular status intact. To be discharged per routine protocol.  Will follow up in approximately  1 week in the outpatient clinic.

## 2018-02-17 NOTE — OR Nursing (Signed)
Discharge instructions discussed with pt and Dad. Both voice understanding.

## 2018-02-20 ENCOUNTER — Encounter: Payer: Self-pay | Admitting: Podiatry

## 2018-02-21 LAB — SURGICAL PATHOLOGY

## 2018-02-23 LAB — AEROBIC/ANAEROBIC CULTURE W GRAM STAIN (SURGICAL/DEEP WOUND)

## 2018-03-12 DIAGNOSIS — Z9181 History of falling: Secondary | ICD-10-CM | POA: Insufficient documentation

## 2018-03-17 DIAGNOSIS — R61 Generalized hyperhidrosis: Secondary | ICD-10-CM | POA: Insufficient documentation

## 2018-07-06 ENCOUNTER — Encounter (INDEPENDENT_AMBULATORY_CARE_PROVIDER_SITE_OTHER): Payer: Medicare Other

## 2018-07-06 ENCOUNTER — Ambulatory Visit (INDEPENDENT_AMBULATORY_CARE_PROVIDER_SITE_OTHER): Payer: Medicare Other | Admitting: Vascular Surgery

## 2018-07-21 DIAGNOSIS — Z23 Encounter for immunization: Secondary | ICD-10-CM | POA: Insufficient documentation

## 2018-07-27 DIAGNOSIS — E441 Mild protein-calorie malnutrition: Secondary | ICD-10-CM | POA: Insufficient documentation

## 2019-01-02 DIAGNOSIS — R0982 Postnasal drip: Secondary | ICD-10-CM | POA: Insufficient documentation

## 2019-01-02 DIAGNOSIS — R0683 Snoring: Secondary | ICD-10-CM | POA: Insufficient documentation

## 2019-04-08 DIAGNOSIS — G4733 Obstructive sleep apnea (adult) (pediatric): Secondary | ICD-10-CM | POA: Diagnosis present

## 2019-05-29 ENCOUNTER — Other Ambulatory Visit
Admission: RE | Admit: 2019-05-29 | Discharge: 2019-05-29 | Disposition: A | Discharge status: Home / Self Care | Payer: Medicare Other | Source: Ambulatory Visit | Attending: Podiatry | Admitting: Podiatry

## 2019-05-29 DIAGNOSIS — M659 Synovitis and tenosynovitis, unspecified: Secondary | ICD-10-CM | POA: Insufficient documentation

## 2019-05-29 DIAGNOSIS — M25572 Pain in left ankle and joints of left foot: Secondary | ICD-10-CM | POA: Insufficient documentation

## 2019-05-30 LAB — SYNOVIAL FLUID, CRYSTAL: Crystals, Fluid: NONE SEEN

## 2019-06-02 LAB — BODY FLUID CULTURE: Culture: NO GROWTH

## 2020-02-08 DIAGNOSIS — Z992 Dependence on renal dialysis: Secondary | ICD-10-CM | POA: Diagnosis not present

## 2020-02-08 DIAGNOSIS — N2581 Secondary hyperparathyroidism of renal origin: Secondary | ICD-10-CM | POA: Diagnosis not present

## 2020-02-08 DIAGNOSIS — D689 Coagulation defect, unspecified: Secondary | ICD-10-CM | POA: Diagnosis not present

## 2020-02-08 DIAGNOSIS — N186 End stage renal disease: Secondary | ICD-10-CM | POA: Diagnosis not present

## 2020-02-08 DIAGNOSIS — D509 Iron deficiency anemia, unspecified: Secondary | ICD-10-CM | POA: Diagnosis not present

## 2020-02-08 DIAGNOSIS — D631 Anemia in chronic kidney disease: Secondary | ICD-10-CM | POA: Diagnosis not present

## 2020-02-11 DIAGNOSIS — N186 End stage renal disease: Secondary | ICD-10-CM | POA: Diagnosis not present

## 2020-02-11 DIAGNOSIS — D689 Coagulation defect, unspecified: Secondary | ICD-10-CM | POA: Diagnosis not present

## 2020-02-11 DIAGNOSIS — Z992 Dependence on renal dialysis: Secondary | ICD-10-CM | POA: Diagnosis not present

## 2020-02-11 DIAGNOSIS — N2581 Secondary hyperparathyroidism of renal origin: Secondary | ICD-10-CM | POA: Diagnosis not present

## 2020-02-11 DIAGNOSIS — D509 Iron deficiency anemia, unspecified: Secondary | ICD-10-CM | POA: Diagnosis not present

## 2020-02-11 DIAGNOSIS — D631 Anemia in chronic kidney disease: Secondary | ICD-10-CM | POA: Diagnosis not present

## 2020-02-13 DIAGNOSIS — N186 End stage renal disease: Secondary | ICD-10-CM | POA: Diagnosis not present

## 2020-02-13 DIAGNOSIS — D509 Iron deficiency anemia, unspecified: Secondary | ICD-10-CM | POA: Diagnosis not present

## 2020-02-13 DIAGNOSIS — D631 Anemia in chronic kidney disease: Secondary | ICD-10-CM | POA: Diagnosis not present

## 2020-02-13 DIAGNOSIS — Z992 Dependence on renal dialysis: Secondary | ICD-10-CM | POA: Diagnosis not present

## 2020-02-13 DIAGNOSIS — D689 Coagulation defect, unspecified: Secondary | ICD-10-CM | POA: Diagnosis not present

## 2020-02-13 DIAGNOSIS — N2581 Secondary hyperparathyroidism of renal origin: Secondary | ICD-10-CM | POA: Diagnosis not present

## 2020-02-15 DIAGNOSIS — N186 End stage renal disease: Secondary | ICD-10-CM | POA: Diagnosis not present

## 2020-02-15 DIAGNOSIS — D689 Coagulation defect, unspecified: Secondary | ICD-10-CM | POA: Diagnosis not present

## 2020-02-15 DIAGNOSIS — D509 Iron deficiency anemia, unspecified: Secondary | ICD-10-CM | POA: Diagnosis not present

## 2020-02-15 DIAGNOSIS — Z992 Dependence on renal dialysis: Secondary | ICD-10-CM | POA: Diagnosis not present

## 2020-02-15 DIAGNOSIS — D631 Anemia in chronic kidney disease: Secondary | ICD-10-CM | POA: Diagnosis not present

## 2020-02-15 DIAGNOSIS — N2581 Secondary hyperparathyroidism of renal origin: Secondary | ICD-10-CM | POA: Diagnosis not present

## 2020-02-18 DIAGNOSIS — D631 Anemia in chronic kidney disease: Secondary | ICD-10-CM | POA: Diagnosis not present

## 2020-02-18 DIAGNOSIS — D689 Coagulation defect, unspecified: Secondary | ICD-10-CM | POA: Diagnosis not present

## 2020-02-18 DIAGNOSIS — D509 Iron deficiency anemia, unspecified: Secondary | ICD-10-CM | POA: Diagnosis not present

## 2020-02-18 DIAGNOSIS — N2581 Secondary hyperparathyroidism of renal origin: Secondary | ICD-10-CM | POA: Diagnosis not present

## 2020-02-18 DIAGNOSIS — Z992 Dependence on renal dialysis: Secondary | ICD-10-CM | POA: Diagnosis not present

## 2020-02-18 DIAGNOSIS — N186 End stage renal disease: Secondary | ICD-10-CM | POA: Diagnosis not present

## 2020-02-20 ENCOUNTER — Encounter: Payer: Self-pay | Admitting: Internal Medicine

## 2020-02-20 DIAGNOSIS — Z992 Dependence on renal dialysis: Secondary | ICD-10-CM | POA: Diagnosis not present

## 2020-02-20 DIAGNOSIS — D689 Coagulation defect, unspecified: Secondary | ICD-10-CM | POA: Diagnosis not present

## 2020-02-20 DIAGNOSIS — N2581 Secondary hyperparathyroidism of renal origin: Secondary | ICD-10-CM | POA: Diagnosis not present

## 2020-02-20 DIAGNOSIS — D631 Anemia in chronic kidney disease: Secondary | ICD-10-CM | POA: Diagnosis not present

## 2020-02-20 DIAGNOSIS — D509 Iron deficiency anemia, unspecified: Secondary | ICD-10-CM | POA: Diagnosis not present

## 2020-02-20 DIAGNOSIS — N186 End stage renal disease: Secondary | ICD-10-CM | POA: Diagnosis not present

## 2020-02-20 DIAGNOSIS — G4733 Obstructive sleep apnea (adult) (pediatric): Secondary | ICD-10-CM | POA: Diagnosis not present

## 2020-02-22 DIAGNOSIS — N186 End stage renal disease: Secondary | ICD-10-CM | POA: Diagnosis not present

## 2020-02-22 DIAGNOSIS — D509 Iron deficiency anemia, unspecified: Secondary | ICD-10-CM | POA: Diagnosis not present

## 2020-02-22 DIAGNOSIS — D631 Anemia in chronic kidney disease: Secondary | ICD-10-CM | POA: Diagnosis not present

## 2020-02-22 DIAGNOSIS — Z992 Dependence on renal dialysis: Secondary | ICD-10-CM | POA: Diagnosis not present

## 2020-02-22 DIAGNOSIS — D689 Coagulation defect, unspecified: Secondary | ICD-10-CM | POA: Diagnosis not present

## 2020-02-22 DIAGNOSIS — N2581 Secondary hyperparathyroidism of renal origin: Secondary | ICD-10-CM | POA: Diagnosis not present

## 2020-02-27 DIAGNOSIS — N186 End stage renal disease: Secondary | ICD-10-CM | POA: Diagnosis not present

## 2020-02-27 DIAGNOSIS — D509 Iron deficiency anemia, unspecified: Secondary | ICD-10-CM | POA: Diagnosis not present

## 2020-02-27 DIAGNOSIS — D631 Anemia in chronic kidney disease: Secondary | ICD-10-CM | POA: Diagnosis not present

## 2020-02-27 DIAGNOSIS — N2581 Secondary hyperparathyroidism of renal origin: Secondary | ICD-10-CM | POA: Diagnosis not present

## 2020-02-27 DIAGNOSIS — Z992 Dependence on renal dialysis: Secondary | ICD-10-CM | POA: Diagnosis not present

## 2020-02-27 DIAGNOSIS — D689 Coagulation defect, unspecified: Secondary | ICD-10-CM | POA: Diagnosis not present

## 2020-02-29 DIAGNOSIS — N186 End stage renal disease: Secondary | ICD-10-CM | POA: Diagnosis not present

## 2020-02-29 DIAGNOSIS — D509 Iron deficiency anemia, unspecified: Secondary | ICD-10-CM | POA: Diagnosis not present

## 2020-02-29 DIAGNOSIS — D689 Coagulation defect, unspecified: Secondary | ICD-10-CM | POA: Diagnosis not present

## 2020-02-29 DIAGNOSIS — N2581 Secondary hyperparathyroidism of renal origin: Secondary | ICD-10-CM | POA: Diagnosis not present

## 2020-02-29 DIAGNOSIS — D631 Anemia in chronic kidney disease: Secondary | ICD-10-CM | POA: Diagnosis not present

## 2020-02-29 DIAGNOSIS — Z992 Dependence on renal dialysis: Secondary | ICD-10-CM | POA: Diagnosis not present

## 2020-03-03 DIAGNOSIS — Z992 Dependence on renal dialysis: Secondary | ICD-10-CM | POA: Diagnosis not present

## 2020-03-03 DIAGNOSIS — N2581 Secondary hyperparathyroidism of renal origin: Secondary | ICD-10-CM | POA: Diagnosis not present

## 2020-03-03 DIAGNOSIS — D509 Iron deficiency anemia, unspecified: Secondary | ICD-10-CM | POA: Diagnosis not present

## 2020-03-03 DIAGNOSIS — D631 Anemia in chronic kidney disease: Secondary | ICD-10-CM | POA: Diagnosis not present

## 2020-03-03 DIAGNOSIS — D689 Coagulation defect, unspecified: Secondary | ICD-10-CM | POA: Diagnosis not present

## 2020-03-03 DIAGNOSIS — N186 End stage renal disease: Secondary | ICD-10-CM | POA: Diagnosis not present

## 2020-03-05 DIAGNOSIS — D509 Iron deficiency anemia, unspecified: Secondary | ICD-10-CM | POA: Diagnosis not present

## 2020-03-05 DIAGNOSIS — N2581 Secondary hyperparathyroidism of renal origin: Secondary | ICD-10-CM | POA: Diagnosis not present

## 2020-03-05 DIAGNOSIS — D689 Coagulation defect, unspecified: Secondary | ICD-10-CM | POA: Diagnosis not present

## 2020-03-05 DIAGNOSIS — D631 Anemia in chronic kidney disease: Secondary | ICD-10-CM | POA: Diagnosis not present

## 2020-03-05 DIAGNOSIS — Z992 Dependence on renal dialysis: Secondary | ICD-10-CM | POA: Diagnosis not present

## 2020-03-05 DIAGNOSIS — N186 End stage renal disease: Secondary | ICD-10-CM | POA: Diagnosis not present

## 2020-03-07 DIAGNOSIS — Z992 Dependence on renal dialysis: Secondary | ICD-10-CM | POA: Diagnosis not present

## 2020-03-07 DIAGNOSIS — D689 Coagulation defect, unspecified: Secondary | ICD-10-CM | POA: Diagnosis not present

## 2020-03-07 DIAGNOSIS — N186 End stage renal disease: Secondary | ICD-10-CM | POA: Diagnosis not present

## 2020-03-07 DIAGNOSIS — D631 Anemia in chronic kidney disease: Secondary | ICD-10-CM | POA: Diagnosis not present

## 2020-03-07 DIAGNOSIS — N2581 Secondary hyperparathyroidism of renal origin: Secondary | ICD-10-CM | POA: Diagnosis not present

## 2020-03-07 DIAGNOSIS — E1122 Type 2 diabetes mellitus with diabetic chronic kidney disease: Secondary | ICD-10-CM | POA: Diagnosis not present

## 2020-03-07 DIAGNOSIS — D509 Iron deficiency anemia, unspecified: Secondary | ICD-10-CM | POA: Diagnosis not present

## 2020-03-10 DIAGNOSIS — D689 Coagulation defect, unspecified: Secondary | ICD-10-CM | POA: Diagnosis not present

## 2020-03-10 DIAGNOSIS — D509 Iron deficiency anemia, unspecified: Secondary | ICD-10-CM | POA: Diagnosis not present

## 2020-03-10 DIAGNOSIS — D631 Anemia in chronic kidney disease: Secondary | ICD-10-CM | POA: Diagnosis not present

## 2020-03-10 DIAGNOSIS — N186 End stage renal disease: Secondary | ICD-10-CM | POA: Diagnosis not present

## 2020-03-10 DIAGNOSIS — Z992 Dependence on renal dialysis: Secondary | ICD-10-CM | POA: Diagnosis not present

## 2020-03-10 DIAGNOSIS — N2581 Secondary hyperparathyroidism of renal origin: Secondary | ICD-10-CM | POA: Diagnosis not present

## 2020-03-12 DIAGNOSIS — N186 End stage renal disease: Secondary | ICD-10-CM | POA: Diagnosis not present

## 2020-03-12 DIAGNOSIS — N2581 Secondary hyperparathyroidism of renal origin: Secondary | ICD-10-CM | POA: Diagnosis not present

## 2020-03-12 DIAGNOSIS — D509 Iron deficiency anemia, unspecified: Secondary | ICD-10-CM | POA: Diagnosis not present

## 2020-03-12 DIAGNOSIS — D631 Anemia in chronic kidney disease: Secondary | ICD-10-CM | POA: Diagnosis not present

## 2020-03-12 DIAGNOSIS — Z992 Dependence on renal dialysis: Secondary | ICD-10-CM | POA: Diagnosis not present

## 2020-03-12 DIAGNOSIS — D689 Coagulation defect, unspecified: Secondary | ICD-10-CM | POA: Diagnosis not present

## 2020-03-17 DIAGNOSIS — N2581 Secondary hyperparathyroidism of renal origin: Secondary | ICD-10-CM | POA: Diagnosis not present

## 2020-03-17 DIAGNOSIS — D689 Coagulation defect, unspecified: Secondary | ICD-10-CM | POA: Diagnosis not present

## 2020-03-17 DIAGNOSIS — D509 Iron deficiency anemia, unspecified: Secondary | ICD-10-CM | POA: Diagnosis not present

## 2020-03-17 DIAGNOSIS — D631 Anemia in chronic kidney disease: Secondary | ICD-10-CM | POA: Diagnosis not present

## 2020-03-17 DIAGNOSIS — N186 End stage renal disease: Secondary | ICD-10-CM | POA: Diagnosis not present

## 2020-03-17 DIAGNOSIS — Z992 Dependence on renal dialysis: Secondary | ICD-10-CM | POA: Diagnosis not present

## 2020-03-18 DIAGNOSIS — L97511 Non-pressure chronic ulcer of other part of right foot limited to breakdown of skin: Secondary | ICD-10-CM | POA: Diagnosis not present

## 2020-03-18 DIAGNOSIS — B351 Tinea unguium: Secondary | ICD-10-CM | POA: Diagnosis not present

## 2020-03-18 DIAGNOSIS — Z89422 Acquired absence of other left toe(s): Secondary | ICD-10-CM | POA: Diagnosis not present

## 2020-03-18 DIAGNOSIS — E11621 Type 2 diabetes mellitus with foot ulcer: Secondary | ICD-10-CM | POA: Diagnosis not present

## 2020-03-18 DIAGNOSIS — L97509 Non-pressure chronic ulcer of other part of unspecified foot with unspecified severity: Secondary | ICD-10-CM | POA: Diagnosis not present

## 2020-03-19 DIAGNOSIS — D509 Iron deficiency anemia, unspecified: Secondary | ICD-10-CM | POA: Diagnosis not present

## 2020-03-19 DIAGNOSIS — N2581 Secondary hyperparathyroidism of renal origin: Secondary | ICD-10-CM | POA: Diagnosis not present

## 2020-03-19 DIAGNOSIS — D631 Anemia in chronic kidney disease: Secondary | ICD-10-CM | POA: Diagnosis not present

## 2020-03-19 DIAGNOSIS — N186 End stage renal disease: Secondary | ICD-10-CM | POA: Diagnosis not present

## 2020-03-19 DIAGNOSIS — D689 Coagulation defect, unspecified: Secondary | ICD-10-CM | POA: Diagnosis not present

## 2020-03-19 DIAGNOSIS — Z992 Dependence on renal dialysis: Secondary | ICD-10-CM | POA: Diagnosis not present

## 2020-03-21 DIAGNOSIS — Z992 Dependence on renal dialysis: Secondary | ICD-10-CM | POA: Diagnosis not present

## 2020-03-21 DIAGNOSIS — D631 Anemia in chronic kidney disease: Secondary | ICD-10-CM | POA: Diagnosis not present

## 2020-03-21 DIAGNOSIS — N2581 Secondary hyperparathyroidism of renal origin: Secondary | ICD-10-CM | POA: Diagnosis not present

## 2020-03-21 DIAGNOSIS — D689 Coagulation defect, unspecified: Secondary | ICD-10-CM | POA: Diagnosis not present

## 2020-03-21 DIAGNOSIS — N186 End stage renal disease: Secondary | ICD-10-CM | POA: Diagnosis not present

## 2020-03-21 DIAGNOSIS — D509 Iron deficiency anemia, unspecified: Secondary | ICD-10-CM | POA: Diagnosis not present

## 2020-03-24 DIAGNOSIS — D509 Iron deficiency anemia, unspecified: Secondary | ICD-10-CM | POA: Diagnosis not present

## 2020-03-24 DIAGNOSIS — Z992 Dependence on renal dialysis: Secondary | ICD-10-CM | POA: Diagnosis not present

## 2020-03-24 DIAGNOSIS — D689 Coagulation defect, unspecified: Secondary | ICD-10-CM | POA: Diagnosis not present

## 2020-03-24 DIAGNOSIS — N186 End stage renal disease: Secondary | ICD-10-CM | POA: Diagnosis not present

## 2020-03-24 DIAGNOSIS — D631 Anemia in chronic kidney disease: Secondary | ICD-10-CM | POA: Diagnosis not present

## 2020-03-24 DIAGNOSIS — N2581 Secondary hyperparathyroidism of renal origin: Secondary | ICD-10-CM | POA: Diagnosis not present

## 2020-03-26 DIAGNOSIS — Z992 Dependence on renal dialysis: Secondary | ICD-10-CM | POA: Diagnosis not present

## 2020-03-26 DIAGNOSIS — D509 Iron deficiency anemia, unspecified: Secondary | ICD-10-CM | POA: Diagnosis not present

## 2020-03-26 DIAGNOSIS — D631 Anemia in chronic kidney disease: Secondary | ICD-10-CM | POA: Diagnosis not present

## 2020-03-26 DIAGNOSIS — N2581 Secondary hyperparathyroidism of renal origin: Secondary | ICD-10-CM | POA: Diagnosis not present

## 2020-03-26 DIAGNOSIS — N186 End stage renal disease: Secondary | ICD-10-CM | POA: Diagnosis not present

## 2020-03-26 DIAGNOSIS — D689 Coagulation defect, unspecified: Secondary | ICD-10-CM | POA: Diagnosis not present

## 2020-03-28 DIAGNOSIS — Z992 Dependence on renal dialysis: Secondary | ICD-10-CM | POA: Diagnosis not present

## 2020-03-28 DIAGNOSIS — N186 End stage renal disease: Secondary | ICD-10-CM | POA: Diagnosis not present

## 2020-03-28 DIAGNOSIS — D509 Iron deficiency anemia, unspecified: Secondary | ICD-10-CM | POA: Diagnosis not present

## 2020-03-28 DIAGNOSIS — D689 Coagulation defect, unspecified: Secondary | ICD-10-CM | POA: Diagnosis not present

## 2020-03-28 DIAGNOSIS — D631 Anemia in chronic kidney disease: Secondary | ICD-10-CM | POA: Diagnosis not present

## 2020-03-28 DIAGNOSIS — N2581 Secondary hyperparathyroidism of renal origin: Secondary | ICD-10-CM | POA: Diagnosis not present

## 2020-03-31 DIAGNOSIS — D689 Coagulation defect, unspecified: Secondary | ICD-10-CM | POA: Diagnosis not present

## 2020-03-31 DIAGNOSIS — N186 End stage renal disease: Secondary | ICD-10-CM | POA: Diagnosis not present

## 2020-03-31 DIAGNOSIS — D631 Anemia in chronic kidney disease: Secondary | ICD-10-CM | POA: Diagnosis not present

## 2020-03-31 DIAGNOSIS — D509 Iron deficiency anemia, unspecified: Secondary | ICD-10-CM | POA: Diagnosis not present

## 2020-03-31 DIAGNOSIS — Z992 Dependence on renal dialysis: Secondary | ICD-10-CM | POA: Diagnosis not present

## 2020-03-31 DIAGNOSIS — N2581 Secondary hyperparathyroidism of renal origin: Secondary | ICD-10-CM | POA: Diagnosis not present

## 2020-04-01 DIAGNOSIS — E11621 Type 2 diabetes mellitus with foot ulcer: Secondary | ICD-10-CM | POA: Diagnosis not present

## 2020-04-01 DIAGNOSIS — L97522 Non-pressure chronic ulcer of other part of left foot with fat layer exposed: Secondary | ICD-10-CM | POA: Diagnosis not present

## 2020-04-01 DIAGNOSIS — L97509 Non-pressure chronic ulcer of other part of unspecified foot with unspecified severity: Secondary | ICD-10-CM | POA: Diagnosis not present

## 2020-04-02 DIAGNOSIS — N2581 Secondary hyperparathyroidism of renal origin: Secondary | ICD-10-CM | POA: Diagnosis not present

## 2020-04-02 DIAGNOSIS — D631 Anemia in chronic kidney disease: Secondary | ICD-10-CM | POA: Diagnosis not present

## 2020-04-02 DIAGNOSIS — Z992 Dependence on renal dialysis: Secondary | ICD-10-CM | POA: Diagnosis not present

## 2020-04-02 DIAGNOSIS — D509 Iron deficiency anemia, unspecified: Secondary | ICD-10-CM | POA: Diagnosis not present

## 2020-04-02 DIAGNOSIS — D689 Coagulation defect, unspecified: Secondary | ICD-10-CM | POA: Diagnosis not present

## 2020-04-02 DIAGNOSIS — N186 End stage renal disease: Secondary | ICD-10-CM | POA: Diagnosis not present

## 2020-04-04 DIAGNOSIS — N2581 Secondary hyperparathyroidism of renal origin: Secondary | ICD-10-CM | POA: Diagnosis not present

## 2020-04-04 DIAGNOSIS — Z992 Dependence on renal dialysis: Secondary | ICD-10-CM | POA: Diagnosis not present

## 2020-04-04 DIAGNOSIS — D509 Iron deficiency anemia, unspecified: Secondary | ICD-10-CM | POA: Diagnosis not present

## 2020-04-04 DIAGNOSIS — D631 Anemia in chronic kidney disease: Secondary | ICD-10-CM | POA: Diagnosis not present

## 2020-04-04 DIAGNOSIS — N186 End stage renal disease: Secondary | ICD-10-CM | POA: Diagnosis not present

## 2020-04-04 DIAGNOSIS — D689 Coagulation defect, unspecified: Secondary | ICD-10-CM | POA: Diagnosis not present

## 2020-04-07 DIAGNOSIS — N186 End stage renal disease: Secondary | ICD-10-CM | POA: Diagnosis not present

## 2020-04-07 DIAGNOSIS — E1122 Type 2 diabetes mellitus with diabetic chronic kidney disease: Secondary | ICD-10-CM | POA: Diagnosis not present

## 2020-04-07 DIAGNOSIS — Z992 Dependence on renal dialysis: Secondary | ICD-10-CM | POA: Diagnosis not present

## 2020-04-09 DIAGNOSIS — Z992 Dependence on renal dialysis: Secondary | ICD-10-CM | POA: Diagnosis not present

## 2020-04-09 DIAGNOSIS — D631 Anemia in chronic kidney disease: Secondary | ICD-10-CM | POA: Diagnosis not present

## 2020-04-09 DIAGNOSIS — N2581 Secondary hyperparathyroidism of renal origin: Secondary | ICD-10-CM | POA: Diagnosis not present

## 2020-04-09 DIAGNOSIS — E1122 Type 2 diabetes mellitus with diabetic chronic kidney disease: Secondary | ICD-10-CM | POA: Diagnosis not present

## 2020-04-09 DIAGNOSIS — N186 End stage renal disease: Secondary | ICD-10-CM | POA: Diagnosis not present

## 2020-04-09 DIAGNOSIS — D689 Coagulation defect, unspecified: Secondary | ICD-10-CM | POA: Diagnosis not present

## 2020-04-09 DIAGNOSIS — D509 Iron deficiency anemia, unspecified: Secondary | ICD-10-CM | POA: Diagnosis not present

## 2020-04-14 DIAGNOSIS — E1122 Type 2 diabetes mellitus with diabetic chronic kidney disease: Secondary | ICD-10-CM | POA: Diagnosis not present

## 2020-04-14 DIAGNOSIS — N186 End stage renal disease: Secondary | ICD-10-CM | POA: Diagnosis not present

## 2020-04-14 DIAGNOSIS — Z992 Dependence on renal dialysis: Secondary | ICD-10-CM | POA: Diagnosis not present

## 2020-04-14 DIAGNOSIS — N2581 Secondary hyperparathyroidism of renal origin: Secondary | ICD-10-CM | POA: Diagnosis not present

## 2020-04-14 DIAGNOSIS — D689 Coagulation defect, unspecified: Secondary | ICD-10-CM | POA: Diagnosis not present

## 2020-04-14 DIAGNOSIS — D631 Anemia in chronic kidney disease: Secondary | ICD-10-CM | POA: Diagnosis not present

## 2020-04-14 DIAGNOSIS — D509 Iron deficiency anemia, unspecified: Secondary | ICD-10-CM | POA: Diagnosis not present

## 2020-04-15 DIAGNOSIS — L97521 Non-pressure chronic ulcer of other part of left foot limited to breakdown of skin: Secondary | ICD-10-CM | POA: Diagnosis not present

## 2020-04-15 DIAGNOSIS — E11621 Type 2 diabetes mellitus with foot ulcer: Secondary | ICD-10-CM | POA: Diagnosis not present

## 2020-04-16 DIAGNOSIS — E1122 Type 2 diabetes mellitus with diabetic chronic kidney disease: Secondary | ICD-10-CM | POA: Diagnosis not present

## 2020-04-16 DIAGNOSIS — N2581 Secondary hyperparathyroidism of renal origin: Secondary | ICD-10-CM | POA: Diagnosis not present

## 2020-04-16 DIAGNOSIS — Z992 Dependence on renal dialysis: Secondary | ICD-10-CM | POA: Diagnosis not present

## 2020-04-16 DIAGNOSIS — N186 End stage renal disease: Secondary | ICD-10-CM | POA: Diagnosis not present

## 2020-04-16 DIAGNOSIS — D509 Iron deficiency anemia, unspecified: Secondary | ICD-10-CM | POA: Diagnosis not present

## 2020-04-16 DIAGNOSIS — D689 Coagulation defect, unspecified: Secondary | ICD-10-CM | POA: Diagnosis not present

## 2020-04-16 DIAGNOSIS — D631 Anemia in chronic kidney disease: Secondary | ICD-10-CM | POA: Diagnosis not present

## 2020-04-18 DIAGNOSIS — D509 Iron deficiency anemia, unspecified: Secondary | ICD-10-CM | POA: Diagnosis not present

## 2020-04-18 DIAGNOSIS — Z992 Dependence on renal dialysis: Secondary | ICD-10-CM | POA: Diagnosis not present

## 2020-04-18 DIAGNOSIS — D631 Anemia in chronic kidney disease: Secondary | ICD-10-CM | POA: Diagnosis not present

## 2020-04-18 DIAGNOSIS — D689 Coagulation defect, unspecified: Secondary | ICD-10-CM | POA: Diagnosis not present

## 2020-04-18 DIAGNOSIS — N186 End stage renal disease: Secondary | ICD-10-CM | POA: Diagnosis not present

## 2020-04-18 DIAGNOSIS — N2581 Secondary hyperparathyroidism of renal origin: Secondary | ICD-10-CM | POA: Diagnosis not present

## 2020-04-18 DIAGNOSIS — E1122 Type 2 diabetes mellitus with diabetic chronic kidney disease: Secondary | ICD-10-CM | POA: Diagnosis not present

## 2020-04-21 DIAGNOSIS — E1122 Type 2 diabetes mellitus with diabetic chronic kidney disease: Secondary | ICD-10-CM | POA: Diagnosis not present

## 2020-04-21 DIAGNOSIS — D689 Coagulation defect, unspecified: Secondary | ICD-10-CM | POA: Diagnosis not present

## 2020-04-21 DIAGNOSIS — N186 End stage renal disease: Secondary | ICD-10-CM | POA: Diagnosis not present

## 2020-04-21 DIAGNOSIS — D509 Iron deficiency anemia, unspecified: Secondary | ICD-10-CM | POA: Diagnosis not present

## 2020-04-21 DIAGNOSIS — D631 Anemia in chronic kidney disease: Secondary | ICD-10-CM | POA: Diagnosis not present

## 2020-04-21 DIAGNOSIS — N2581 Secondary hyperparathyroidism of renal origin: Secondary | ICD-10-CM | POA: Diagnosis not present

## 2020-04-21 DIAGNOSIS — Z992 Dependence on renal dialysis: Secondary | ICD-10-CM | POA: Diagnosis not present

## 2020-04-23 DIAGNOSIS — D689 Coagulation defect, unspecified: Secondary | ICD-10-CM | POA: Diagnosis not present

## 2020-04-23 DIAGNOSIS — D631 Anemia in chronic kidney disease: Secondary | ICD-10-CM | POA: Diagnosis not present

## 2020-04-23 DIAGNOSIS — E1122 Type 2 diabetes mellitus with diabetic chronic kidney disease: Secondary | ICD-10-CM | POA: Diagnosis not present

## 2020-04-23 DIAGNOSIS — N186 End stage renal disease: Secondary | ICD-10-CM | POA: Diagnosis not present

## 2020-04-23 DIAGNOSIS — Z992 Dependence on renal dialysis: Secondary | ICD-10-CM | POA: Diagnosis not present

## 2020-04-23 DIAGNOSIS — N2581 Secondary hyperparathyroidism of renal origin: Secondary | ICD-10-CM | POA: Diagnosis not present

## 2020-04-23 DIAGNOSIS — D509 Iron deficiency anemia, unspecified: Secondary | ICD-10-CM | POA: Diagnosis not present

## 2020-04-25 DIAGNOSIS — D631 Anemia in chronic kidney disease: Secondary | ICD-10-CM | POA: Diagnosis not present

## 2020-04-25 DIAGNOSIS — D689 Coagulation defect, unspecified: Secondary | ICD-10-CM | POA: Diagnosis not present

## 2020-04-25 DIAGNOSIS — N186 End stage renal disease: Secondary | ICD-10-CM | POA: Diagnosis not present

## 2020-04-25 DIAGNOSIS — Z992 Dependence on renal dialysis: Secondary | ICD-10-CM | POA: Diagnosis not present

## 2020-04-25 DIAGNOSIS — N2581 Secondary hyperparathyroidism of renal origin: Secondary | ICD-10-CM | POA: Diagnosis not present

## 2020-04-25 DIAGNOSIS — E1122 Type 2 diabetes mellitus with diabetic chronic kidney disease: Secondary | ICD-10-CM | POA: Diagnosis not present

## 2020-04-25 DIAGNOSIS — D509 Iron deficiency anemia, unspecified: Secondary | ICD-10-CM | POA: Diagnosis not present

## 2020-04-28 DIAGNOSIS — Z992 Dependence on renal dialysis: Secondary | ICD-10-CM | POA: Diagnosis not present

## 2020-04-28 DIAGNOSIS — N186 End stage renal disease: Secondary | ICD-10-CM | POA: Diagnosis not present

## 2020-04-28 DIAGNOSIS — D689 Coagulation defect, unspecified: Secondary | ICD-10-CM | POA: Diagnosis not present

## 2020-04-28 DIAGNOSIS — D509 Iron deficiency anemia, unspecified: Secondary | ICD-10-CM | POA: Diagnosis not present

## 2020-04-28 DIAGNOSIS — D631 Anemia in chronic kidney disease: Secondary | ICD-10-CM | POA: Diagnosis not present

## 2020-04-28 DIAGNOSIS — E1122 Type 2 diabetes mellitus with diabetic chronic kidney disease: Secondary | ICD-10-CM | POA: Diagnosis not present

## 2020-04-28 DIAGNOSIS — N2581 Secondary hyperparathyroidism of renal origin: Secondary | ICD-10-CM | POA: Diagnosis not present

## 2020-04-30 DIAGNOSIS — N2581 Secondary hyperparathyroidism of renal origin: Secondary | ICD-10-CM | POA: Diagnosis not present

## 2020-04-30 DIAGNOSIS — D689 Coagulation defect, unspecified: Secondary | ICD-10-CM | POA: Diagnosis not present

## 2020-04-30 DIAGNOSIS — D509 Iron deficiency anemia, unspecified: Secondary | ICD-10-CM | POA: Diagnosis not present

## 2020-04-30 DIAGNOSIS — E1122 Type 2 diabetes mellitus with diabetic chronic kidney disease: Secondary | ICD-10-CM | POA: Diagnosis not present

## 2020-04-30 DIAGNOSIS — Z992 Dependence on renal dialysis: Secondary | ICD-10-CM | POA: Diagnosis not present

## 2020-04-30 DIAGNOSIS — D631 Anemia in chronic kidney disease: Secondary | ICD-10-CM | POA: Diagnosis not present

## 2020-04-30 DIAGNOSIS — N186 End stage renal disease: Secondary | ICD-10-CM | POA: Diagnosis not present

## 2020-05-02 DIAGNOSIS — D631 Anemia in chronic kidney disease: Secondary | ICD-10-CM | POA: Diagnosis not present

## 2020-05-02 DIAGNOSIS — E1122 Type 2 diabetes mellitus with diabetic chronic kidney disease: Secondary | ICD-10-CM | POA: Diagnosis not present

## 2020-05-02 DIAGNOSIS — D689 Coagulation defect, unspecified: Secondary | ICD-10-CM | POA: Diagnosis not present

## 2020-05-02 DIAGNOSIS — D509 Iron deficiency anemia, unspecified: Secondary | ICD-10-CM | POA: Diagnosis not present

## 2020-05-02 DIAGNOSIS — N186 End stage renal disease: Secondary | ICD-10-CM | POA: Diagnosis not present

## 2020-05-02 DIAGNOSIS — N2581 Secondary hyperparathyroidism of renal origin: Secondary | ICD-10-CM | POA: Diagnosis not present

## 2020-05-02 DIAGNOSIS — Z992 Dependence on renal dialysis: Secondary | ICD-10-CM | POA: Diagnosis not present

## 2020-05-06 DIAGNOSIS — L97521 Non-pressure chronic ulcer of other part of left foot limited to breakdown of skin: Secondary | ICD-10-CM | POA: Diagnosis not present

## 2020-05-06 DIAGNOSIS — Z89422 Acquired absence of other left toe(s): Secondary | ICD-10-CM | POA: Diagnosis not present

## 2020-05-06 DIAGNOSIS — M14672 Charcot's joint, left ankle and foot: Secondary | ICD-10-CM | POA: Diagnosis not present

## 2020-05-06 DIAGNOSIS — L97509 Non-pressure chronic ulcer of other part of unspecified foot with unspecified severity: Secondary | ICD-10-CM | POA: Diagnosis not present

## 2020-05-06 DIAGNOSIS — E11621 Type 2 diabetes mellitus with foot ulcer: Secondary | ICD-10-CM | POA: Diagnosis not present

## 2020-05-07 DIAGNOSIS — D509 Iron deficiency anemia, unspecified: Secondary | ICD-10-CM | POA: Diagnosis not present

## 2020-05-07 DIAGNOSIS — Z992 Dependence on renal dialysis: Secondary | ICD-10-CM | POA: Diagnosis not present

## 2020-05-07 DIAGNOSIS — D689 Coagulation defect, unspecified: Secondary | ICD-10-CM | POA: Diagnosis not present

## 2020-05-07 DIAGNOSIS — D631 Anemia in chronic kidney disease: Secondary | ICD-10-CM | POA: Diagnosis not present

## 2020-05-07 DIAGNOSIS — N186 End stage renal disease: Secondary | ICD-10-CM | POA: Diagnosis not present

## 2020-05-07 DIAGNOSIS — E1122 Type 2 diabetes mellitus with diabetic chronic kidney disease: Secondary | ICD-10-CM | POA: Diagnosis not present

## 2020-05-07 DIAGNOSIS — N2581 Secondary hyperparathyroidism of renal origin: Secondary | ICD-10-CM | POA: Diagnosis not present

## 2020-05-09 DIAGNOSIS — D631 Anemia in chronic kidney disease: Secondary | ICD-10-CM | POA: Diagnosis not present

## 2020-05-09 DIAGNOSIS — D689 Coagulation defect, unspecified: Secondary | ICD-10-CM | POA: Diagnosis not present

## 2020-05-09 DIAGNOSIS — N2581 Secondary hyperparathyroidism of renal origin: Secondary | ICD-10-CM | POA: Diagnosis not present

## 2020-05-09 DIAGNOSIS — D509 Iron deficiency anemia, unspecified: Secondary | ICD-10-CM | POA: Diagnosis not present

## 2020-05-09 DIAGNOSIS — N186 End stage renal disease: Secondary | ICD-10-CM | POA: Diagnosis not present

## 2020-05-09 DIAGNOSIS — Z992 Dependence on renal dialysis: Secondary | ICD-10-CM | POA: Diagnosis not present

## 2020-05-14 DIAGNOSIS — D689 Coagulation defect, unspecified: Secondary | ICD-10-CM | POA: Diagnosis not present

## 2020-05-14 DIAGNOSIS — N186 End stage renal disease: Secondary | ICD-10-CM | POA: Diagnosis not present

## 2020-05-14 DIAGNOSIS — N2581 Secondary hyperparathyroidism of renal origin: Secondary | ICD-10-CM | POA: Diagnosis not present

## 2020-05-14 DIAGNOSIS — D509 Iron deficiency anemia, unspecified: Secondary | ICD-10-CM | POA: Diagnosis not present

## 2020-05-14 DIAGNOSIS — Z992 Dependence on renal dialysis: Secondary | ICD-10-CM | POA: Diagnosis not present

## 2020-05-14 DIAGNOSIS — D631 Anemia in chronic kidney disease: Secondary | ICD-10-CM | POA: Diagnosis not present

## 2020-05-16 DIAGNOSIS — N186 End stage renal disease: Secondary | ICD-10-CM | POA: Diagnosis not present

## 2020-05-16 DIAGNOSIS — Z992 Dependence on renal dialysis: Secondary | ICD-10-CM | POA: Diagnosis not present

## 2020-05-16 DIAGNOSIS — D689 Coagulation defect, unspecified: Secondary | ICD-10-CM | POA: Diagnosis not present

## 2020-05-16 DIAGNOSIS — N2581 Secondary hyperparathyroidism of renal origin: Secondary | ICD-10-CM | POA: Diagnosis not present

## 2020-05-16 DIAGNOSIS — D631 Anemia in chronic kidney disease: Secondary | ICD-10-CM | POA: Diagnosis not present

## 2020-05-16 DIAGNOSIS — D509 Iron deficiency anemia, unspecified: Secondary | ICD-10-CM | POA: Diagnosis not present

## 2020-05-19 DIAGNOSIS — N186 End stage renal disease: Secondary | ICD-10-CM | POA: Diagnosis not present

## 2020-05-19 DIAGNOSIS — Z992 Dependence on renal dialysis: Secondary | ICD-10-CM | POA: Diagnosis not present

## 2020-05-19 DIAGNOSIS — D631 Anemia in chronic kidney disease: Secondary | ICD-10-CM | POA: Diagnosis not present

## 2020-05-19 DIAGNOSIS — N2581 Secondary hyperparathyroidism of renal origin: Secondary | ICD-10-CM | POA: Diagnosis not present

## 2020-05-19 DIAGNOSIS — D689 Coagulation defect, unspecified: Secondary | ICD-10-CM | POA: Diagnosis not present

## 2020-05-19 DIAGNOSIS — D509 Iron deficiency anemia, unspecified: Secondary | ICD-10-CM | POA: Diagnosis not present

## 2020-05-21 DIAGNOSIS — Z992 Dependence on renal dialysis: Secondary | ICD-10-CM | POA: Diagnosis not present

## 2020-05-21 DIAGNOSIS — D631 Anemia in chronic kidney disease: Secondary | ICD-10-CM | POA: Diagnosis not present

## 2020-05-21 DIAGNOSIS — N2581 Secondary hyperparathyroidism of renal origin: Secondary | ICD-10-CM | POA: Diagnosis not present

## 2020-05-21 DIAGNOSIS — D689 Coagulation defect, unspecified: Secondary | ICD-10-CM | POA: Diagnosis not present

## 2020-05-21 DIAGNOSIS — N186 End stage renal disease: Secondary | ICD-10-CM | POA: Diagnosis not present

## 2020-05-21 DIAGNOSIS — D509 Iron deficiency anemia, unspecified: Secondary | ICD-10-CM | POA: Diagnosis not present

## 2020-05-23 DIAGNOSIS — N2581 Secondary hyperparathyroidism of renal origin: Secondary | ICD-10-CM | POA: Diagnosis not present

## 2020-05-23 DIAGNOSIS — N186 End stage renal disease: Secondary | ICD-10-CM | POA: Diagnosis not present

## 2020-05-23 DIAGNOSIS — D631 Anemia in chronic kidney disease: Secondary | ICD-10-CM | POA: Diagnosis not present

## 2020-05-23 DIAGNOSIS — D509 Iron deficiency anemia, unspecified: Secondary | ICD-10-CM | POA: Diagnosis not present

## 2020-05-23 DIAGNOSIS — D689 Coagulation defect, unspecified: Secondary | ICD-10-CM | POA: Diagnosis not present

## 2020-05-23 DIAGNOSIS — Z992 Dependence on renal dialysis: Secondary | ICD-10-CM | POA: Diagnosis not present

## 2020-05-26 DIAGNOSIS — D509 Iron deficiency anemia, unspecified: Secondary | ICD-10-CM | POA: Diagnosis not present

## 2020-05-26 DIAGNOSIS — Z992 Dependence on renal dialysis: Secondary | ICD-10-CM | POA: Diagnosis not present

## 2020-05-26 DIAGNOSIS — N2581 Secondary hyperparathyroidism of renal origin: Secondary | ICD-10-CM | POA: Diagnosis not present

## 2020-05-26 DIAGNOSIS — D689 Coagulation defect, unspecified: Secondary | ICD-10-CM | POA: Diagnosis not present

## 2020-05-26 DIAGNOSIS — N186 End stage renal disease: Secondary | ICD-10-CM | POA: Diagnosis not present

## 2020-05-26 DIAGNOSIS — D631 Anemia in chronic kidney disease: Secondary | ICD-10-CM | POA: Diagnosis not present

## 2020-05-27 DIAGNOSIS — E11621 Type 2 diabetes mellitus with foot ulcer: Secondary | ICD-10-CM | POA: Diagnosis not present

## 2020-05-27 DIAGNOSIS — L97509 Non-pressure chronic ulcer of other part of unspecified foot with unspecified severity: Secondary | ICD-10-CM | POA: Diagnosis not present

## 2020-05-27 DIAGNOSIS — Z794 Long term (current) use of insulin: Secondary | ICD-10-CM | POA: Diagnosis not present

## 2020-05-27 DIAGNOSIS — Z89422 Acquired absence of other left toe(s): Secondary | ICD-10-CM | POA: Diagnosis not present

## 2020-05-27 DIAGNOSIS — S90822A Blister (nonthermal), left foot, initial encounter: Secondary | ICD-10-CM | POA: Diagnosis not present

## 2020-05-28 DIAGNOSIS — D689 Coagulation defect, unspecified: Secondary | ICD-10-CM | POA: Diagnosis not present

## 2020-05-28 DIAGNOSIS — Z992 Dependence on renal dialysis: Secondary | ICD-10-CM | POA: Diagnosis not present

## 2020-05-28 DIAGNOSIS — N2581 Secondary hyperparathyroidism of renal origin: Secondary | ICD-10-CM | POA: Diagnosis not present

## 2020-05-28 DIAGNOSIS — D631 Anemia in chronic kidney disease: Secondary | ICD-10-CM | POA: Diagnosis not present

## 2020-05-28 DIAGNOSIS — D509 Iron deficiency anemia, unspecified: Secondary | ICD-10-CM | POA: Diagnosis not present

## 2020-05-28 DIAGNOSIS — N186 End stage renal disease: Secondary | ICD-10-CM | POA: Diagnosis not present

## 2020-05-30 DIAGNOSIS — D631 Anemia in chronic kidney disease: Secondary | ICD-10-CM | POA: Diagnosis not present

## 2020-05-30 DIAGNOSIS — Z992 Dependence on renal dialysis: Secondary | ICD-10-CM | POA: Diagnosis not present

## 2020-05-30 DIAGNOSIS — N2581 Secondary hyperparathyroidism of renal origin: Secondary | ICD-10-CM | POA: Diagnosis not present

## 2020-05-30 DIAGNOSIS — D689 Coagulation defect, unspecified: Secondary | ICD-10-CM | POA: Diagnosis not present

## 2020-05-30 DIAGNOSIS — D509 Iron deficiency anemia, unspecified: Secondary | ICD-10-CM | POA: Diagnosis not present

## 2020-05-30 DIAGNOSIS — N186 End stage renal disease: Secondary | ICD-10-CM | POA: Diagnosis not present

## 2020-06-04 DIAGNOSIS — D631 Anemia in chronic kidney disease: Secondary | ICD-10-CM | POA: Diagnosis not present

## 2020-06-04 DIAGNOSIS — D689 Coagulation defect, unspecified: Secondary | ICD-10-CM | POA: Diagnosis not present

## 2020-06-04 DIAGNOSIS — N186 End stage renal disease: Secondary | ICD-10-CM | POA: Diagnosis not present

## 2020-06-04 DIAGNOSIS — N2581 Secondary hyperparathyroidism of renal origin: Secondary | ICD-10-CM | POA: Diagnosis not present

## 2020-06-04 DIAGNOSIS — D509 Iron deficiency anemia, unspecified: Secondary | ICD-10-CM | POA: Diagnosis not present

## 2020-06-04 DIAGNOSIS — Z992 Dependence on renal dialysis: Secondary | ICD-10-CM | POA: Diagnosis not present

## 2020-06-06 DIAGNOSIS — N186 End stage renal disease: Secondary | ICD-10-CM | POA: Diagnosis not present

## 2020-06-06 DIAGNOSIS — D689 Coagulation defect, unspecified: Secondary | ICD-10-CM | POA: Diagnosis not present

## 2020-06-06 DIAGNOSIS — D631 Anemia in chronic kidney disease: Secondary | ICD-10-CM | POA: Diagnosis not present

## 2020-06-06 DIAGNOSIS — Z992 Dependence on renal dialysis: Secondary | ICD-10-CM | POA: Diagnosis not present

## 2020-06-06 DIAGNOSIS — D509 Iron deficiency anemia, unspecified: Secondary | ICD-10-CM | POA: Diagnosis not present

## 2020-06-06 DIAGNOSIS — N2581 Secondary hyperparathyroidism of renal origin: Secondary | ICD-10-CM | POA: Diagnosis not present

## 2020-06-07 DIAGNOSIS — E1122 Type 2 diabetes mellitus with diabetic chronic kidney disease: Secondary | ICD-10-CM | POA: Diagnosis not present

## 2020-06-07 DIAGNOSIS — N186 End stage renal disease: Secondary | ICD-10-CM | POA: Diagnosis not present

## 2020-06-07 DIAGNOSIS — Z992 Dependence on renal dialysis: Secondary | ICD-10-CM | POA: Diagnosis not present

## 2020-06-09 DIAGNOSIS — N186 End stage renal disease: Secondary | ICD-10-CM | POA: Diagnosis not present

## 2020-06-09 DIAGNOSIS — D689 Coagulation defect, unspecified: Secondary | ICD-10-CM | POA: Diagnosis not present

## 2020-06-09 DIAGNOSIS — N2581 Secondary hyperparathyroidism of renal origin: Secondary | ICD-10-CM | POA: Diagnosis not present

## 2020-06-09 DIAGNOSIS — D631 Anemia in chronic kidney disease: Secondary | ICD-10-CM | POA: Diagnosis not present

## 2020-06-09 DIAGNOSIS — D509 Iron deficiency anemia, unspecified: Secondary | ICD-10-CM | POA: Diagnosis not present

## 2020-06-09 DIAGNOSIS — Z992 Dependence on renal dialysis: Secondary | ICD-10-CM | POA: Diagnosis not present

## 2020-06-11 DIAGNOSIS — N186 End stage renal disease: Secondary | ICD-10-CM | POA: Diagnosis not present

## 2020-06-11 DIAGNOSIS — D689 Coagulation defect, unspecified: Secondary | ICD-10-CM | POA: Diagnosis not present

## 2020-06-11 DIAGNOSIS — D509 Iron deficiency anemia, unspecified: Secondary | ICD-10-CM | POA: Diagnosis not present

## 2020-06-11 DIAGNOSIS — N2581 Secondary hyperparathyroidism of renal origin: Secondary | ICD-10-CM | POA: Diagnosis not present

## 2020-06-11 DIAGNOSIS — Z992 Dependence on renal dialysis: Secondary | ICD-10-CM | POA: Diagnosis not present

## 2020-06-11 DIAGNOSIS — D631 Anemia in chronic kidney disease: Secondary | ICD-10-CM | POA: Diagnosis not present

## 2020-06-13 DIAGNOSIS — N186 End stage renal disease: Secondary | ICD-10-CM | POA: Diagnosis not present

## 2020-06-13 DIAGNOSIS — D509 Iron deficiency anemia, unspecified: Secondary | ICD-10-CM | POA: Diagnosis not present

## 2020-06-13 DIAGNOSIS — N2581 Secondary hyperparathyroidism of renal origin: Secondary | ICD-10-CM | POA: Diagnosis not present

## 2020-06-13 DIAGNOSIS — D631 Anemia in chronic kidney disease: Secondary | ICD-10-CM | POA: Diagnosis not present

## 2020-06-13 DIAGNOSIS — D689 Coagulation defect, unspecified: Secondary | ICD-10-CM | POA: Diagnosis not present

## 2020-06-13 DIAGNOSIS — Z992 Dependence on renal dialysis: Secondary | ICD-10-CM | POA: Diagnosis not present

## 2020-06-16 DIAGNOSIS — Z992 Dependence on renal dialysis: Secondary | ICD-10-CM | POA: Diagnosis not present

## 2020-06-16 DIAGNOSIS — D689 Coagulation defect, unspecified: Secondary | ICD-10-CM | POA: Diagnosis not present

## 2020-06-16 DIAGNOSIS — D631 Anemia in chronic kidney disease: Secondary | ICD-10-CM | POA: Diagnosis not present

## 2020-06-16 DIAGNOSIS — N2581 Secondary hyperparathyroidism of renal origin: Secondary | ICD-10-CM | POA: Diagnosis not present

## 2020-06-16 DIAGNOSIS — D509 Iron deficiency anemia, unspecified: Secondary | ICD-10-CM | POA: Diagnosis not present

## 2020-06-16 DIAGNOSIS — N186 End stage renal disease: Secondary | ICD-10-CM | POA: Diagnosis not present

## 2020-06-17 DIAGNOSIS — L97522 Non-pressure chronic ulcer of other part of left foot with fat layer exposed: Secondary | ICD-10-CM | POA: Diagnosis not present

## 2020-06-17 DIAGNOSIS — Z794 Long term (current) use of insulin: Secondary | ICD-10-CM | POA: Diagnosis not present

## 2020-06-17 DIAGNOSIS — E11621 Type 2 diabetes mellitus with foot ulcer: Secondary | ICD-10-CM | POA: Diagnosis not present

## 2020-06-17 DIAGNOSIS — L97509 Non-pressure chronic ulcer of other part of unspecified foot with unspecified severity: Secondary | ICD-10-CM | POA: Diagnosis not present

## 2020-06-18 DIAGNOSIS — D509 Iron deficiency anemia, unspecified: Secondary | ICD-10-CM | POA: Diagnosis not present

## 2020-06-18 DIAGNOSIS — D631 Anemia in chronic kidney disease: Secondary | ICD-10-CM | POA: Diagnosis not present

## 2020-06-18 DIAGNOSIS — D689 Coagulation defect, unspecified: Secondary | ICD-10-CM | POA: Diagnosis not present

## 2020-06-18 DIAGNOSIS — N2581 Secondary hyperparathyroidism of renal origin: Secondary | ICD-10-CM | POA: Diagnosis not present

## 2020-06-18 DIAGNOSIS — N186 End stage renal disease: Secondary | ICD-10-CM | POA: Diagnosis not present

## 2020-06-18 DIAGNOSIS — Z992 Dependence on renal dialysis: Secondary | ICD-10-CM | POA: Diagnosis not present

## 2020-06-20 DIAGNOSIS — D631 Anemia in chronic kidney disease: Secondary | ICD-10-CM | POA: Diagnosis not present

## 2020-06-20 DIAGNOSIS — N186 End stage renal disease: Secondary | ICD-10-CM | POA: Diagnosis not present

## 2020-06-20 DIAGNOSIS — Z992 Dependence on renal dialysis: Secondary | ICD-10-CM | POA: Diagnosis not present

## 2020-06-20 DIAGNOSIS — D509 Iron deficiency anemia, unspecified: Secondary | ICD-10-CM | POA: Diagnosis not present

## 2020-06-20 DIAGNOSIS — D689 Coagulation defect, unspecified: Secondary | ICD-10-CM | POA: Diagnosis not present

## 2020-06-20 DIAGNOSIS — N2581 Secondary hyperparathyroidism of renal origin: Secondary | ICD-10-CM | POA: Diagnosis not present

## 2020-06-21 DIAGNOSIS — G4733 Obstructive sleep apnea (adult) (pediatric): Secondary | ICD-10-CM | POA: Diagnosis not present

## 2020-06-25 DIAGNOSIS — D509 Iron deficiency anemia, unspecified: Secondary | ICD-10-CM | POA: Diagnosis not present

## 2020-06-25 DIAGNOSIS — D631 Anemia in chronic kidney disease: Secondary | ICD-10-CM | POA: Diagnosis not present

## 2020-06-25 DIAGNOSIS — Z992 Dependence on renal dialysis: Secondary | ICD-10-CM | POA: Diagnosis not present

## 2020-06-25 DIAGNOSIS — E1122 Type 2 diabetes mellitus with diabetic chronic kidney disease: Secondary | ICD-10-CM | POA: Diagnosis not present

## 2020-06-25 DIAGNOSIS — E1065 Type 1 diabetes mellitus with hyperglycemia: Secondary | ICD-10-CM | POA: Diagnosis not present

## 2020-06-25 DIAGNOSIS — Z4902 Encounter for fitting and adjustment of peritoneal dialysis catheter: Secondary | ICD-10-CM | POA: Diagnosis not present

## 2020-06-25 DIAGNOSIS — G4733 Obstructive sleep apnea (adult) (pediatric): Secondary | ICD-10-CM | POA: Diagnosis not present

## 2020-06-25 DIAGNOSIS — D689 Coagulation defect, unspecified: Secondary | ICD-10-CM | POA: Diagnosis not present

## 2020-06-25 DIAGNOSIS — N2581 Secondary hyperparathyroidism of renal origin: Secondary | ICD-10-CM | POA: Diagnosis not present

## 2020-06-25 DIAGNOSIS — N186 End stage renal disease: Secondary | ICD-10-CM | POA: Diagnosis not present

## 2020-06-27 DIAGNOSIS — N186 End stage renal disease: Secondary | ICD-10-CM | POA: Diagnosis not present

## 2020-06-27 DIAGNOSIS — D689 Coagulation defect, unspecified: Secondary | ICD-10-CM | POA: Diagnosis not present

## 2020-06-27 DIAGNOSIS — D631 Anemia in chronic kidney disease: Secondary | ICD-10-CM | POA: Diagnosis not present

## 2020-06-27 DIAGNOSIS — Z992 Dependence on renal dialysis: Secondary | ICD-10-CM | POA: Diagnosis not present

## 2020-06-27 DIAGNOSIS — D509 Iron deficiency anemia, unspecified: Secondary | ICD-10-CM | POA: Diagnosis not present

## 2020-06-27 DIAGNOSIS — N2581 Secondary hyperparathyroidism of renal origin: Secondary | ICD-10-CM | POA: Diagnosis not present

## 2020-06-30 DIAGNOSIS — D689 Coagulation defect, unspecified: Secondary | ICD-10-CM | POA: Diagnosis not present

## 2020-06-30 DIAGNOSIS — Z992 Dependence on renal dialysis: Secondary | ICD-10-CM | POA: Diagnosis not present

## 2020-06-30 DIAGNOSIS — N2581 Secondary hyperparathyroidism of renal origin: Secondary | ICD-10-CM | POA: Diagnosis not present

## 2020-06-30 DIAGNOSIS — D631 Anemia in chronic kidney disease: Secondary | ICD-10-CM | POA: Diagnosis not present

## 2020-06-30 DIAGNOSIS — D509 Iron deficiency anemia, unspecified: Secondary | ICD-10-CM | POA: Diagnosis not present

## 2020-06-30 DIAGNOSIS — N186 End stage renal disease: Secondary | ICD-10-CM | POA: Diagnosis not present

## 2020-07-02 DIAGNOSIS — N186 End stage renal disease: Secondary | ICD-10-CM | POA: Diagnosis not present

## 2020-07-02 DIAGNOSIS — D509 Iron deficiency anemia, unspecified: Secondary | ICD-10-CM | POA: Diagnosis not present

## 2020-07-02 DIAGNOSIS — N2581 Secondary hyperparathyroidism of renal origin: Secondary | ICD-10-CM | POA: Diagnosis not present

## 2020-07-02 DIAGNOSIS — Z992 Dependence on renal dialysis: Secondary | ICD-10-CM | POA: Diagnosis not present

## 2020-07-02 DIAGNOSIS — D631 Anemia in chronic kidney disease: Secondary | ICD-10-CM | POA: Diagnosis not present

## 2020-07-02 DIAGNOSIS — D689 Coagulation defect, unspecified: Secondary | ICD-10-CM | POA: Diagnosis not present

## 2020-07-07 DIAGNOSIS — D509 Iron deficiency anemia, unspecified: Secondary | ICD-10-CM | POA: Diagnosis not present

## 2020-07-07 DIAGNOSIS — D689 Coagulation defect, unspecified: Secondary | ICD-10-CM | POA: Diagnosis not present

## 2020-07-07 DIAGNOSIS — Z992 Dependence on renal dialysis: Secondary | ICD-10-CM | POA: Diagnosis not present

## 2020-07-07 DIAGNOSIS — N2581 Secondary hyperparathyroidism of renal origin: Secondary | ICD-10-CM | POA: Diagnosis not present

## 2020-07-07 DIAGNOSIS — N186 End stage renal disease: Secondary | ICD-10-CM | POA: Diagnosis not present

## 2020-07-07 DIAGNOSIS — D631 Anemia in chronic kidney disease: Secondary | ICD-10-CM | POA: Diagnosis not present

## 2020-07-08 DIAGNOSIS — Z992 Dependence on renal dialysis: Secondary | ICD-10-CM | POA: Diagnosis not present

## 2020-07-08 DIAGNOSIS — L97509 Non-pressure chronic ulcer of other part of unspecified foot with unspecified severity: Secondary | ICD-10-CM | POA: Diagnosis not present

## 2020-07-08 DIAGNOSIS — L97521 Non-pressure chronic ulcer of other part of left foot limited to breakdown of skin: Secondary | ICD-10-CM | POA: Diagnosis not present

## 2020-07-08 DIAGNOSIS — E11621 Type 2 diabetes mellitus with foot ulcer: Secondary | ICD-10-CM | POA: Diagnosis not present

## 2020-07-08 DIAGNOSIS — E1122 Type 2 diabetes mellitus with diabetic chronic kidney disease: Secondary | ICD-10-CM | POA: Diagnosis not present

## 2020-07-08 DIAGNOSIS — N186 End stage renal disease: Secondary | ICD-10-CM | POA: Diagnosis not present

## 2020-07-08 DIAGNOSIS — Z794 Long term (current) use of insulin: Secondary | ICD-10-CM | POA: Diagnosis not present

## 2020-07-09 DIAGNOSIS — Z992 Dependence on renal dialysis: Secondary | ICD-10-CM | POA: Diagnosis not present

## 2020-07-09 DIAGNOSIS — E1122 Type 2 diabetes mellitus with diabetic chronic kidney disease: Secondary | ICD-10-CM | POA: Diagnosis not present

## 2020-07-09 DIAGNOSIS — D509 Iron deficiency anemia, unspecified: Secondary | ICD-10-CM | POA: Diagnosis not present

## 2020-07-09 DIAGNOSIS — N2581 Secondary hyperparathyroidism of renal origin: Secondary | ICD-10-CM | POA: Diagnosis not present

## 2020-07-09 DIAGNOSIS — N186 End stage renal disease: Secondary | ICD-10-CM | POA: Diagnosis not present

## 2020-07-09 DIAGNOSIS — D631 Anemia in chronic kidney disease: Secondary | ICD-10-CM | POA: Diagnosis not present

## 2020-07-09 DIAGNOSIS — D689 Coagulation defect, unspecified: Secondary | ICD-10-CM | POA: Diagnosis not present

## 2020-07-11 DIAGNOSIS — D631 Anemia in chronic kidney disease: Secondary | ICD-10-CM | POA: Diagnosis not present

## 2020-07-11 DIAGNOSIS — D509 Iron deficiency anemia, unspecified: Secondary | ICD-10-CM | POA: Diagnosis not present

## 2020-07-11 DIAGNOSIS — D689 Coagulation defect, unspecified: Secondary | ICD-10-CM | POA: Diagnosis not present

## 2020-07-11 DIAGNOSIS — E1122 Type 2 diabetes mellitus with diabetic chronic kidney disease: Secondary | ICD-10-CM | POA: Diagnosis not present

## 2020-07-11 DIAGNOSIS — N186 End stage renal disease: Secondary | ICD-10-CM | POA: Diagnosis not present

## 2020-07-11 DIAGNOSIS — Z992 Dependence on renal dialysis: Secondary | ICD-10-CM | POA: Diagnosis not present

## 2020-07-11 DIAGNOSIS — N2581 Secondary hyperparathyroidism of renal origin: Secondary | ICD-10-CM | POA: Diagnosis not present

## 2020-07-15 DIAGNOSIS — E10621 Type 1 diabetes mellitus with foot ulcer: Secondary | ICD-10-CM | POA: Diagnosis not present

## 2020-07-15 DIAGNOSIS — E1022 Type 1 diabetes mellitus with diabetic chronic kidney disease: Secondary | ICD-10-CM | POA: Diagnosis not present

## 2020-07-15 DIAGNOSIS — N186 End stage renal disease: Secondary | ICD-10-CM | POA: Diagnosis not present

## 2020-07-15 DIAGNOSIS — I10 Essential (primary) hypertension: Secondary | ICD-10-CM | POA: Diagnosis not present

## 2020-07-15 DIAGNOSIS — Z794 Long term (current) use of insulin: Secondary | ICD-10-CM | POA: Diagnosis not present

## 2020-07-15 DIAGNOSIS — E1042 Type 1 diabetes mellitus with diabetic polyneuropathy: Secondary | ICD-10-CM | POA: Diagnosis not present

## 2020-07-15 DIAGNOSIS — Z992 Dependence on renal dialysis: Secondary | ICD-10-CM | POA: Diagnosis not present

## 2020-07-15 DIAGNOSIS — Z23 Encounter for immunization: Secondary | ICD-10-CM | POA: Diagnosis not present

## 2020-07-15 DIAGNOSIS — E1065 Type 1 diabetes mellitus with hyperglycemia: Secondary | ICD-10-CM | POA: Diagnosis not present

## 2020-07-15 DIAGNOSIS — I120 Hypertensive chronic kidney disease with stage 5 chronic kidney disease or end stage renal disease: Secondary | ICD-10-CM | POA: Diagnosis not present

## 2020-07-16 DIAGNOSIS — D631 Anemia in chronic kidney disease: Secondary | ICD-10-CM | POA: Diagnosis not present

## 2020-07-16 DIAGNOSIS — D689 Coagulation defect, unspecified: Secondary | ICD-10-CM | POA: Diagnosis not present

## 2020-07-16 DIAGNOSIS — N186 End stage renal disease: Secondary | ICD-10-CM | POA: Diagnosis not present

## 2020-07-16 DIAGNOSIS — D509 Iron deficiency anemia, unspecified: Secondary | ICD-10-CM | POA: Diagnosis not present

## 2020-07-16 DIAGNOSIS — E1122 Type 2 diabetes mellitus with diabetic chronic kidney disease: Secondary | ICD-10-CM | POA: Diagnosis not present

## 2020-07-16 DIAGNOSIS — Z992 Dependence on renal dialysis: Secondary | ICD-10-CM | POA: Diagnosis not present

## 2020-07-16 DIAGNOSIS — N2581 Secondary hyperparathyroidism of renal origin: Secondary | ICD-10-CM | POA: Diagnosis not present

## 2020-07-17 DIAGNOSIS — N189 Chronic kidney disease, unspecified: Secondary | ICD-10-CM | POA: Diagnosis not present

## 2020-07-17 DIAGNOSIS — Z4902 Encounter for fitting and adjustment of peritoneal dialysis catheter: Secondary | ICD-10-CM | POA: Diagnosis not present

## 2020-07-17 DIAGNOSIS — Z992 Dependence on renal dialysis: Secondary | ICD-10-CM | POA: Diagnosis not present

## 2020-07-17 DIAGNOSIS — I120 Hypertensive chronic kidney disease with stage 5 chronic kidney disease or end stage renal disease: Secondary | ICD-10-CM | POA: Diagnosis not present

## 2020-07-17 DIAGNOSIS — N185 Chronic kidney disease, stage 5: Secondary | ICD-10-CM | POA: Diagnosis not present

## 2020-07-17 DIAGNOSIS — E1122 Type 2 diabetes mellitus with diabetic chronic kidney disease: Secondary | ICD-10-CM | POA: Diagnosis not present

## 2020-07-17 DIAGNOSIS — G4730 Sleep apnea, unspecified: Secondary | ICD-10-CM | POA: Diagnosis not present

## 2020-07-17 DIAGNOSIS — K660 Peritoneal adhesions (postprocedural) (postinfection): Secondary | ICD-10-CM | POA: Diagnosis not present

## 2020-07-17 DIAGNOSIS — N186 End stage renal disease: Secondary | ICD-10-CM | POA: Diagnosis not present

## 2020-07-18 DIAGNOSIS — Z992 Dependence on renal dialysis: Secondary | ICD-10-CM | POA: Diagnosis not present

## 2020-07-18 DIAGNOSIS — D631 Anemia in chronic kidney disease: Secondary | ICD-10-CM | POA: Diagnosis not present

## 2020-07-18 DIAGNOSIS — N2581 Secondary hyperparathyroidism of renal origin: Secondary | ICD-10-CM | POA: Diagnosis not present

## 2020-07-18 DIAGNOSIS — N186 End stage renal disease: Secondary | ICD-10-CM | POA: Diagnosis not present

## 2020-07-18 DIAGNOSIS — E1122 Type 2 diabetes mellitus with diabetic chronic kidney disease: Secondary | ICD-10-CM | POA: Diagnosis not present

## 2020-07-18 DIAGNOSIS — D689 Coagulation defect, unspecified: Secondary | ICD-10-CM | POA: Diagnosis not present

## 2020-07-18 DIAGNOSIS — D509 Iron deficiency anemia, unspecified: Secondary | ICD-10-CM | POA: Diagnosis not present

## 2020-07-21 DIAGNOSIS — D509 Iron deficiency anemia, unspecified: Secondary | ICD-10-CM | POA: Diagnosis not present

## 2020-07-21 DIAGNOSIS — N2581 Secondary hyperparathyroidism of renal origin: Secondary | ICD-10-CM | POA: Diagnosis not present

## 2020-07-21 DIAGNOSIS — E1122 Type 2 diabetes mellitus with diabetic chronic kidney disease: Secondary | ICD-10-CM | POA: Diagnosis not present

## 2020-07-21 DIAGNOSIS — Z992 Dependence on renal dialysis: Secondary | ICD-10-CM | POA: Diagnosis not present

## 2020-07-21 DIAGNOSIS — D689 Coagulation defect, unspecified: Secondary | ICD-10-CM | POA: Diagnosis not present

## 2020-07-21 DIAGNOSIS — N186 End stage renal disease: Secondary | ICD-10-CM | POA: Diagnosis not present

## 2020-07-21 DIAGNOSIS — D631 Anemia in chronic kidney disease: Secondary | ICD-10-CM | POA: Diagnosis not present

## 2020-07-22 DIAGNOSIS — G4733 Obstructive sleep apnea (adult) (pediatric): Secondary | ICD-10-CM | POA: Diagnosis not present

## 2020-07-23 DIAGNOSIS — Z992 Dependence on renal dialysis: Secondary | ICD-10-CM | POA: Diagnosis not present

## 2020-07-23 DIAGNOSIS — D689 Coagulation defect, unspecified: Secondary | ICD-10-CM | POA: Diagnosis not present

## 2020-07-23 DIAGNOSIS — N186 End stage renal disease: Secondary | ICD-10-CM | POA: Diagnosis not present

## 2020-07-23 DIAGNOSIS — D509 Iron deficiency anemia, unspecified: Secondary | ICD-10-CM | POA: Diagnosis not present

## 2020-07-23 DIAGNOSIS — D631 Anemia in chronic kidney disease: Secondary | ICD-10-CM | POA: Diagnosis not present

## 2020-07-23 DIAGNOSIS — E1122 Type 2 diabetes mellitus with diabetic chronic kidney disease: Secondary | ICD-10-CM | POA: Diagnosis not present

## 2020-07-23 DIAGNOSIS — N2581 Secondary hyperparathyroidism of renal origin: Secondary | ICD-10-CM | POA: Diagnosis not present

## 2020-07-28 DIAGNOSIS — D509 Iron deficiency anemia, unspecified: Secondary | ICD-10-CM | POA: Diagnosis not present

## 2020-07-28 DIAGNOSIS — D631 Anemia in chronic kidney disease: Secondary | ICD-10-CM | POA: Diagnosis not present

## 2020-07-28 DIAGNOSIS — D689 Coagulation defect, unspecified: Secondary | ICD-10-CM | POA: Diagnosis not present

## 2020-07-28 DIAGNOSIS — E1122 Type 2 diabetes mellitus with diabetic chronic kidney disease: Secondary | ICD-10-CM | POA: Diagnosis not present

## 2020-07-28 DIAGNOSIS — N186 End stage renal disease: Secondary | ICD-10-CM | POA: Diagnosis not present

## 2020-07-28 DIAGNOSIS — Z992 Dependence on renal dialysis: Secondary | ICD-10-CM | POA: Diagnosis not present

## 2020-07-28 DIAGNOSIS — N2581 Secondary hyperparathyroidism of renal origin: Secondary | ICD-10-CM | POA: Diagnosis not present

## 2020-07-30 DIAGNOSIS — N186 End stage renal disease: Secondary | ICD-10-CM | POA: Diagnosis not present

## 2020-07-30 DIAGNOSIS — D631 Anemia in chronic kidney disease: Secondary | ICD-10-CM | POA: Diagnosis not present

## 2020-07-30 DIAGNOSIS — N2581 Secondary hyperparathyroidism of renal origin: Secondary | ICD-10-CM | POA: Diagnosis not present

## 2020-07-30 DIAGNOSIS — D689 Coagulation defect, unspecified: Secondary | ICD-10-CM | POA: Diagnosis not present

## 2020-07-30 DIAGNOSIS — Z992 Dependence on renal dialysis: Secondary | ICD-10-CM | POA: Diagnosis not present

## 2020-07-30 DIAGNOSIS — E1122 Type 2 diabetes mellitus with diabetic chronic kidney disease: Secondary | ICD-10-CM | POA: Diagnosis not present

## 2020-07-30 DIAGNOSIS — D509 Iron deficiency anemia, unspecified: Secondary | ICD-10-CM | POA: Diagnosis not present

## 2020-08-01 DIAGNOSIS — D631 Anemia in chronic kidney disease: Secondary | ICD-10-CM | POA: Diagnosis not present

## 2020-08-01 DIAGNOSIS — Z992 Dependence on renal dialysis: Secondary | ICD-10-CM | POA: Diagnosis not present

## 2020-08-01 DIAGNOSIS — N186 End stage renal disease: Secondary | ICD-10-CM | POA: Diagnosis not present

## 2020-08-01 DIAGNOSIS — D509 Iron deficiency anemia, unspecified: Secondary | ICD-10-CM | POA: Diagnosis not present

## 2020-08-01 DIAGNOSIS — N2581 Secondary hyperparathyroidism of renal origin: Secondary | ICD-10-CM | POA: Diagnosis not present

## 2020-08-01 DIAGNOSIS — E1122 Type 2 diabetes mellitus with diabetic chronic kidney disease: Secondary | ICD-10-CM | POA: Diagnosis not present

## 2020-08-01 DIAGNOSIS — D689 Coagulation defect, unspecified: Secondary | ICD-10-CM | POA: Diagnosis not present

## 2020-08-04 DIAGNOSIS — N2581 Secondary hyperparathyroidism of renal origin: Secondary | ICD-10-CM | POA: Diagnosis not present

## 2020-08-04 DIAGNOSIS — Z992 Dependence on renal dialysis: Secondary | ICD-10-CM | POA: Diagnosis not present

## 2020-08-04 DIAGNOSIS — N186 End stage renal disease: Secondary | ICD-10-CM | POA: Diagnosis not present

## 2020-08-05 DIAGNOSIS — Z992 Dependence on renal dialysis: Secondary | ICD-10-CM | POA: Diagnosis not present

## 2020-08-05 DIAGNOSIS — N186 End stage renal disease: Secondary | ICD-10-CM | POA: Diagnosis not present

## 2020-08-05 DIAGNOSIS — N2581 Secondary hyperparathyroidism of renal origin: Secondary | ICD-10-CM | POA: Diagnosis not present

## 2020-08-06 DIAGNOSIS — N2581 Secondary hyperparathyroidism of renal origin: Secondary | ICD-10-CM | POA: Diagnosis not present

## 2020-08-06 DIAGNOSIS — Z992 Dependence on renal dialysis: Secondary | ICD-10-CM | POA: Diagnosis not present

## 2020-08-06 DIAGNOSIS — N186 End stage renal disease: Secondary | ICD-10-CM | POA: Diagnosis not present

## 2020-08-07 DIAGNOSIS — N2581 Secondary hyperparathyroidism of renal origin: Secondary | ICD-10-CM | POA: Diagnosis not present

## 2020-08-07 DIAGNOSIS — N186 End stage renal disease: Secondary | ICD-10-CM | POA: Diagnosis not present

## 2020-08-07 DIAGNOSIS — Z992 Dependence on renal dialysis: Secondary | ICD-10-CM | POA: Diagnosis not present

## 2020-08-07 DIAGNOSIS — E1122 Type 2 diabetes mellitus with diabetic chronic kidney disease: Secondary | ICD-10-CM | POA: Diagnosis not present

## 2020-08-08 DIAGNOSIS — E789 Disorder of lipoprotein metabolism, unspecified: Secondary | ICD-10-CM | POA: Insufficient documentation

## 2020-08-08 DIAGNOSIS — E878 Other disorders of electrolyte and fluid balance, not elsewhere classified: Secondary | ICD-10-CM | POA: Insufficient documentation

## 2020-08-08 DIAGNOSIS — E1122 Type 2 diabetes mellitus with diabetic chronic kidney disease: Secondary | ICD-10-CM | POA: Diagnosis not present

## 2020-08-08 DIAGNOSIS — D509 Iron deficiency anemia, unspecified: Secondary | ICD-10-CM | POA: Diagnosis not present

## 2020-08-08 DIAGNOSIS — K769 Liver disease, unspecified: Secondary | ICD-10-CM | POA: Insufficient documentation

## 2020-08-08 DIAGNOSIS — N2589 Other disorders resulting from impaired renal tubular function: Secondary | ICD-10-CM | POA: Diagnosis not present

## 2020-08-08 DIAGNOSIS — N2581 Secondary hyperparathyroidism of renal origin: Secondary | ICD-10-CM | POA: Diagnosis not present

## 2020-08-08 DIAGNOSIS — R17 Unspecified jaundice: Secondary | ICD-10-CM | POA: Insufficient documentation

## 2020-08-08 DIAGNOSIS — Z992 Dependence on renal dialysis: Secondary | ICD-10-CM | POA: Diagnosis not present

## 2020-08-08 DIAGNOSIS — N186 End stage renal disease: Secondary | ICD-10-CM | POA: Diagnosis not present

## 2020-08-08 DIAGNOSIS — E872 Acidosis, unspecified: Secondary | ICD-10-CM | POA: Insufficient documentation

## 2020-08-08 DIAGNOSIS — Z79899 Other long term (current) drug therapy: Secondary | ICD-10-CM | POA: Insufficient documentation

## 2020-08-11 DIAGNOSIS — D509 Iron deficiency anemia, unspecified: Secondary | ICD-10-CM | POA: Diagnosis not present

## 2020-08-11 DIAGNOSIS — N186 End stage renal disease: Secondary | ICD-10-CM | POA: Diagnosis not present

## 2020-08-11 DIAGNOSIS — Z992 Dependence on renal dialysis: Secondary | ICD-10-CM | POA: Diagnosis not present

## 2020-08-11 DIAGNOSIS — E789 Disorder of lipoprotein metabolism, unspecified: Secondary | ICD-10-CM | POA: Diagnosis not present

## 2020-08-11 DIAGNOSIS — N2581 Secondary hyperparathyroidism of renal origin: Secondary | ICD-10-CM | POA: Diagnosis not present

## 2020-08-11 DIAGNOSIS — N2589 Other disorders resulting from impaired renal tubular function: Secondary | ICD-10-CM | POA: Diagnosis not present

## 2020-08-11 DIAGNOSIS — K769 Liver disease, unspecified: Secondary | ICD-10-CM | POA: Diagnosis not present

## 2020-08-11 DIAGNOSIS — E1122 Type 2 diabetes mellitus with diabetic chronic kidney disease: Secondary | ICD-10-CM | POA: Diagnosis not present

## 2020-08-12 DIAGNOSIS — L97509 Non-pressure chronic ulcer of other part of unspecified foot with unspecified severity: Secondary | ICD-10-CM | POA: Diagnosis not present

## 2020-08-12 DIAGNOSIS — L97521 Non-pressure chronic ulcer of other part of left foot limited to breakdown of skin: Secondary | ICD-10-CM | POA: Diagnosis not present

## 2020-08-12 DIAGNOSIS — N2589 Other disorders resulting from impaired renal tubular function: Secondary | ICD-10-CM | POA: Diagnosis not present

## 2020-08-12 DIAGNOSIS — E789 Disorder of lipoprotein metabolism, unspecified: Secondary | ICD-10-CM | POA: Diagnosis not present

## 2020-08-12 DIAGNOSIS — E11621 Type 2 diabetes mellitus with foot ulcer: Secondary | ICD-10-CM | POA: Diagnosis not present

## 2020-08-12 DIAGNOSIS — K769 Liver disease, unspecified: Secondary | ICD-10-CM | POA: Diagnosis not present

## 2020-08-12 DIAGNOSIS — D509 Iron deficiency anemia, unspecified: Secondary | ICD-10-CM | POA: Diagnosis not present

## 2020-08-12 DIAGNOSIS — N2581 Secondary hyperparathyroidism of renal origin: Secondary | ICD-10-CM | POA: Diagnosis not present

## 2020-08-12 DIAGNOSIS — Z89422 Acquired absence of other left toe(s): Secondary | ICD-10-CM | POA: Diagnosis not present

## 2020-08-12 DIAGNOSIS — Z992 Dependence on renal dialysis: Secondary | ICD-10-CM | POA: Diagnosis not present

## 2020-08-12 DIAGNOSIS — N186 End stage renal disease: Secondary | ICD-10-CM | POA: Diagnosis not present

## 2020-08-12 DIAGNOSIS — Z794 Long term (current) use of insulin: Secondary | ICD-10-CM | POA: Diagnosis not present

## 2020-08-12 DIAGNOSIS — E1122 Type 2 diabetes mellitus with diabetic chronic kidney disease: Secondary | ICD-10-CM | POA: Diagnosis not present

## 2020-08-13 DIAGNOSIS — N2581 Secondary hyperparathyroidism of renal origin: Secondary | ICD-10-CM | POA: Diagnosis not present

## 2020-08-13 DIAGNOSIS — N2589 Other disorders resulting from impaired renal tubular function: Secondary | ICD-10-CM | POA: Diagnosis not present

## 2020-08-13 DIAGNOSIS — K769 Liver disease, unspecified: Secondary | ICD-10-CM | POA: Diagnosis not present

## 2020-08-13 DIAGNOSIS — E1122 Type 2 diabetes mellitus with diabetic chronic kidney disease: Secondary | ICD-10-CM | POA: Diagnosis not present

## 2020-08-13 DIAGNOSIS — G4733 Obstructive sleep apnea (adult) (pediatric): Secondary | ICD-10-CM | POA: Diagnosis not present

## 2020-08-13 DIAGNOSIS — D509 Iron deficiency anemia, unspecified: Secondary | ICD-10-CM | POA: Diagnosis not present

## 2020-08-13 DIAGNOSIS — N186 End stage renal disease: Secondary | ICD-10-CM | POA: Diagnosis not present

## 2020-08-13 DIAGNOSIS — Z992 Dependence on renal dialysis: Secondary | ICD-10-CM | POA: Diagnosis not present

## 2020-08-13 DIAGNOSIS — E789 Disorder of lipoprotein metabolism, unspecified: Secondary | ICD-10-CM | POA: Diagnosis not present

## 2020-08-14 DIAGNOSIS — N186 End stage renal disease: Secondary | ICD-10-CM | POA: Diagnosis not present

## 2020-08-14 DIAGNOSIS — Z992 Dependence on renal dialysis: Secondary | ICD-10-CM | POA: Diagnosis not present

## 2020-08-14 DIAGNOSIS — D631 Anemia in chronic kidney disease: Secondary | ICD-10-CM | POA: Diagnosis not present

## 2020-08-14 DIAGNOSIS — D509 Iron deficiency anemia, unspecified: Secondary | ICD-10-CM | POA: Diagnosis not present

## 2020-08-14 DIAGNOSIS — N2581 Secondary hyperparathyroidism of renal origin: Secondary | ICD-10-CM | POA: Diagnosis not present

## 2020-08-15 DIAGNOSIS — D631 Anemia in chronic kidney disease: Secondary | ICD-10-CM | POA: Diagnosis not present

## 2020-08-15 DIAGNOSIS — D509 Iron deficiency anemia, unspecified: Secondary | ICD-10-CM | POA: Diagnosis not present

## 2020-08-15 DIAGNOSIS — Z992 Dependence on renal dialysis: Secondary | ICD-10-CM | POA: Diagnosis not present

## 2020-08-15 DIAGNOSIS — N2581 Secondary hyperparathyroidism of renal origin: Secondary | ICD-10-CM | POA: Diagnosis not present

## 2020-08-15 DIAGNOSIS — N186 End stage renal disease: Secondary | ICD-10-CM | POA: Diagnosis not present

## 2020-08-16 DIAGNOSIS — N2581 Secondary hyperparathyroidism of renal origin: Secondary | ICD-10-CM | POA: Diagnosis not present

## 2020-08-16 DIAGNOSIS — D631 Anemia in chronic kidney disease: Secondary | ICD-10-CM | POA: Diagnosis not present

## 2020-08-16 DIAGNOSIS — D509 Iron deficiency anemia, unspecified: Secondary | ICD-10-CM | POA: Diagnosis not present

## 2020-08-16 DIAGNOSIS — Z992 Dependence on renal dialysis: Secondary | ICD-10-CM | POA: Diagnosis not present

## 2020-08-16 DIAGNOSIS — N186 End stage renal disease: Secondary | ICD-10-CM | POA: Diagnosis not present

## 2020-08-17 DIAGNOSIS — Z992 Dependence on renal dialysis: Secondary | ICD-10-CM | POA: Diagnosis not present

## 2020-08-17 DIAGNOSIS — N186 End stage renal disease: Secondary | ICD-10-CM | POA: Diagnosis not present

## 2020-08-17 DIAGNOSIS — D509 Iron deficiency anemia, unspecified: Secondary | ICD-10-CM | POA: Diagnosis not present

## 2020-08-17 DIAGNOSIS — D631 Anemia in chronic kidney disease: Secondary | ICD-10-CM | POA: Diagnosis not present

## 2020-08-17 DIAGNOSIS — N2581 Secondary hyperparathyroidism of renal origin: Secondary | ICD-10-CM | POA: Diagnosis not present

## 2020-08-18 DIAGNOSIS — N186 End stage renal disease: Secondary | ICD-10-CM | POA: Diagnosis not present

## 2020-08-18 DIAGNOSIS — N2581 Secondary hyperparathyroidism of renal origin: Secondary | ICD-10-CM | POA: Diagnosis not present

## 2020-08-18 DIAGNOSIS — D631 Anemia in chronic kidney disease: Secondary | ICD-10-CM | POA: Diagnosis not present

## 2020-08-18 DIAGNOSIS — D509 Iron deficiency anemia, unspecified: Secondary | ICD-10-CM | POA: Diagnosis not present

## 2020-08-18 DIAGNOSIS — Z992 Dependence on renal dialysis: Secondary | ICD-10-CM | POA: Diagnosis not present

## 2020-08-19 DIAGNOSIS — Z992 Dependence on renal dialysis: Secondary | ICD-10-CM | POA: Diagnosis not present

## 2020-08-19 DIAGNOSIS — D631 Anemia in chronic kidney disease: Secondary | ICD-10-CM | POA: Diagnosis not present

## 2020-08-19 DIAGNOSIS — N186 End stage renal disease: Secondary | ICD-10-CM | POA: Diagnosis not present

## 2020-08-19 DIAGNOSIS — N2581 Secondary hyperparathyroidism of renal origin: Secondary | ICD-10-CM | POA: Diagnosis not present

## 2020-08-19 DIAGNOSIS — D509 Iron deficiency anemia, unspecified: Secondary | ICD-10-CM | POA: Diagnosis not present

## 2020-08-20 DIAGNOSIS — N186 End stage renal disease: Secondary | ICD-10-CM | POA: Diagnosis not present

## 2020-08-20 DIAGNOSIS — D509 Iron deficiency anemia, unspecified: Secondary | ICD-10-CM | POA: Diagnosis not present

## 2020-08-20 DIAGNOSIS — Z992 Dependence on renal dialysis: Secondary | ICD-10-CM | POA: Diagnosis not present

## 2020-08-20 DIAGNOSIS — D631 Anemia in chronic kidney disease: Secondary | ICD-10-CM | POA: Diagnosis not present

## 2020-08-20 DIAGNOSIS — N2581 Secondary hyperparathyroidism of renal origin: Secondary | ICD-10-CM | POA: Diagnosis not present

## 2020-08-21 DIAGNOSIS — D631 Anemia in chronic kidney disease: Secondary | ICD-10-CM | POA: Diagnosis not present

## 2020-08-21 DIAGNOSIS — D509 Iron deficiency anemia, unspecified: Secondary | ICD-10-CM | POA: Diagnosis not present

## 2020-08-21 DIAGNOSIS — Z992 Dependence on renal dialysis: Secondary | ICD-10-CM | POA: Diagnosis not present

## 2020-08-21 DIAGNOSIS — G4733 Obstructive sleep apnea (adult) (pediatric): Secondary | ICD-10-CM | POA: Diagnosis not present

## 2020-08-21 DIAGNOSIS — N2581 Secondary hyperparathyroidism of renal origin: Secondary | ICD-10-CM | POA: Diagnosis not present

## 2020-08-21 DIAGNOSIS — N186 End stage renal disease: Secondary | ICD-10-CM | POA: Diagnosis not present

## 2020-08-22 DIAGNOSIS — N186 End stage renal disease: Secondary | ICD-10-CM | POA: Diagnosis not present

## 2020-08-22 DIAGNOSIS — N2581 Secondary hyperparathyroidism of renal origin: Secondary | ICD-10-CM | POA: Diagnosis not present

## 2020-08-22 DIAGNOSIS — D509 Iron deficiency anemia, unspecified: Secondary | ICD-10-CM | POA: Diagnosis not present

## 2020-08-22 DIAGNOSIS — Z992 Dependence on renal dialysis: Secondary | ICD-10-CM | POA: Diagnosis not present

## 2020-08-22 DIAGNOSIS — D631 Anemia in chronic kidney disease: Secondary | ICD-10-CM | POA: Diagnosis not present

## 2020-08-23 DIAGNOSIS — N186 End stage renal disease: Secondary | ICD-10-CM | POA: Diagnosis not present

## 2020-08-23 DIAGNOSIS — D631 Anemia in chronic kidney disease: Secondary | ICD-10-CM | POA: Diagnosis not present

## 2020-08-23 DIAGNOSIS — Z992 Dependence on renal dialysis: Secondary | ICD-10-CM | POA: Diagnosis not present

## 2020-08-23 DIAGNOSIS — N2581 Secondary hyperparathyroidism of renal origin: Secondary | ICD-10-CM | POA: Diagnosis not present

## 2020-08-23 DIAGNOSIS — D509 Iron deficiency anemia, unspecified: Secondary | ICD-10-CM | POA: Diagnosis not present

## 2020-08-24 DIAGNOSIS — D631 Anemia in chronic kidney disease: Secondary | ICD-10-CM | POA: Diagnosis not present

## 2020-08-24 DIAGNOSIS — N186 End stage renal disease: Secondary | ICD-10-CM | POA: Diagnosis not present

## 2020-08-24 DIAGNOSIS — D509 Iron deficiency anemia, unspecified: Secondary | ICD-10-CM | POA: Diagnosis not present

## 2020-08-24 DIAGNOSIS — Z992 Dependence on renal dialysis: Secondary | ICD-10-CM | POA: Diagnosis not present

## 2020-08-24 DIAGNOSIS — N2581 Secondary hyperparathyroidism of renal origin: Secondary | ICD-10-CM | POA: Diagnosis not present

## 2020-08-25 DIAGNOSIS — D509 Iron deficiency anemia, unspecified: Secondary | ICD-10-CM | POA: Diagnosis not present

## 2020-08-25 DIAGNOSIS — N186 End stage renal disease: Secondary | ICD-10-CM | POA: Diagnosis not present

## 2020-08-25 DIAGNOSIS — N2581 Secondary hyperparathyroidism of renal origin: Secondary | ICD-10-CM | POA: Diagnosis not present

## 2020-08-25 DIAGNOSIS — D631 Anemia in chronic kidney disease: Secondary | ICD-10-CM | POA: Diagnosis not present

## 2020-08-25 DIAGNOSIS — Z992 Dependence on renal dialysis: Secondary | ICD-10-CM | POA: Diagnosis not present

## 2020-08-26 DIAGNOSIS — D509 Iron deficiency anemia, unspecified: Secondary | ICD-10-CM | POA: Diagnosis not present

## 2020-08-26 DIAGNOSIS — N2581 Secondary hyperparathyroidism of renal origin: Secondary | ICD-10-CM | POA: Diagnosis not present

## 2020-08-26 DIAGNOSIS — N186 End stage renal disease: Secondary | ICD-10-CM | POA: Diagnosis not present

## 2020-08-26 DIAGNOSIS — D631 Anemia in chronic kidney disease: Secondary | ICD-10-CM | POA: Diagnosis not present

## 2020-08-26 DIAGNOSIS — Z992 Dependence on renal dialysis: Secondary | ICD-10-CM | POA: Diagnosis not present

## 2020-08-27 DIAGNOSIS — Z992 Dependence on renal dialysis: Secondary | ICD-10-CM | POA: Diagnosis not present

## 2020-08-27 DIAGNOSIS — N2581 Secondary hyperparathyroidism of renal origin: Secondary | ICD-10-CM | POA: Diagnosis not present

## 2020-08-27 DIAGNOSIS — N186 End stage renal disease: Secondary | ICD-10-CM | POA: Diagnosis not present

## 2020-08-27 DIAGNOSIS — D509 Iron deficiency anemia, unspecified: Secondary | ICD-10-CM | POA: Diagnosis not present

## 2020-08-27 DIAGNOSIS — D631 Anemia in chronic kidney disease: Secondary | ICD-10-CM | POA: Diagnosis not present

## 2020-08-28 DIAGNOSIS — D509 Iron deficiency anemia, unspecified: Secondary | ICD-10-CM | POA: Diagnosis not present

## 2020-08-28 DIAGNOSIS — N186 End stage renal disease: Secondary | ICD-10-CM | POA: Diagnosis not present

## 2020-08-28 DIAGNOSIS — Z992 Dependence on renal dialysis: Secondary | ICD-10-CM | POA: Diagnosis not present

## 2020-08-28 DIAGNOSIS — D631 Anemia in chronic kidney disease: Secondary | ICD-10-CM | POA: Diagnosis not present

## 2020-08-28 DIAGNOSIS — N2581 Secondary hyperparathyroidism of renal origin: Secondary | ICD-10-CM | POA: Diagnosis not present

## 2020-08-29 DIAGNOSIS — Z992 Dependence on renal dialysis: Secondary | ICD-10-CM | POA: Diagnosis not present

## 2020-08-29 DIAGNOSIS — D631 Anemia in chronic kidney disease: Secondary | ICD-10-CM | POA: Diagnosis not present

## 2020-08-29 DIAGNOSIS — D509 Iron deficiency anemia, unspecified: Secondary | ICD-10-CM | POA: Diagnosis not present

## 2020-08-29 DIAGNOSIS — N2581 Secondary hyperparathyroidism of renal origin: Secondary | ICD-10-CM | POA: Diagnosis not present

## 2020-08-29 DIAGNOSIS — N186 End stage renal disease: Secondary | ICD-10-CM | POA: Diagnosis not present

## 2020-08-30 DIAGNOSIS — D631 Anemia in chronic kidney disease: Secondary | ICD-10-CM | POA: Diagnosis not present

## 2020-08-30 DIAGNOSIS — Z992 Dependence on renal dialysis: Secondary | ICD-10-CM | POA: Diagnosis not present

## 2020-08-30 DIAGNOSIS — N186 End stage renal disease: Secondary | ICD-10-CM | POA: Diagnosis not present

## 2020-08-30 DIAGNOSIS — D509 Iron deficiency anemia, unspecified: Secondary | ICD-10-CM | POA: Diagnosis not present

## 2020-08-30 DIAGNOSIS — N2581 Secondary hyperparathyroidism of renal origin: Secondary | ICD-10-CM | POA: Diagnosis not present

## 2020-08-31 DIAGNOSIS — D509 Iron deficiency anemia, unspecified: Secondary | ICD-10-CM | POA: Diagnosis not present

## 2020-08-31 DIAGNOSIS — Z992 Dependence on renal dialysis: Secondary | ICD-10-CM | POA: Diagnosis not present

## 2020-08-31 DIAGNOSIS — D631 Anemia in chronic kidney disease: Secondary | ICD-10-CM | POA: Diagnosis not present

## 2020-08-31 DIAGNOSIS — N2581 Secondary hyperparathyroidism of renal origin: Secondary | ICD-10-CM | POA: Diagnosis not present

## 2020-08-31 DIAGNOSIS — N186 End stage renal disease: Secondary | ICD-10-CM | POA: Diagnosis not present

## 2020-09-01 DIAGNOSIS — Z992 Dependence on renal dialysis: Secondary | ICD-10-CM | POA: Diagnosis not present

## 2020-09-01 DIAGNOSIS — D631 Anemia in chronic kidney disease: Secondary | ICD-10-CM | POA: Diagnosis not present

## 2020-09-01 DIAGNOSIS — N2581 Secondary hyperparathyroidism of renal origin: Secondary | ICD-10-CM | POA: Diagnosis not present

## 2020-09-01 DIAGNOSIS — D509 Iron deficiency anemia, unspecified: Secondary | ICD-10-CM | POA: Diagnosis not present

## 2020-09-01 DIAGNOSIS — N186 End stage renal disease: Secondary | ICD-10-CM | POA: Diagnosis not present

## 2020-09-02 DIAGNOSIS — E11621 Type 2 diabetes mellitus with foot ulcer: Secondary | ICD-10-CM | POA: Diagnosis not present

## 2020-09-02 DIAGNOSIS — D631 Anemia in chronic kidney disease: Secondary | ICD-10-CM | POA: Diagnosis not present

## 2020-09-02 DIAGNOSIS — N2581 Secondary hyperparathyroidism of renal origin: Secondary | ICD-10-CM | POA: Diagnosis not present

## 2020-09-02 DIAGNOSIS — L97522 Non-pressure chronic ulcer of other part of left foot with fat layer exposed: Secondary | ICD-10-CM | POA: Diagnosis not present

## 2020-09-02 DIAGNOSIS — M25572 Pain in left ankle and joints of left foot: Secondary | ICD-10-CM | POA: Diagnosis not present

## 2020-09-02 DIAGNOSIS — M14672 Charcot's joint, left ankle and foot: Secondary | ICD-10-CM | POA: Diagnosis not present

## 2020-09-02 DIAGNOSIS — D509 Iron deficiency anemia, unspecified: Secondary | ICD-10-CM | POA: Diagnosis not present

## 2020-09-02 DIAGNOSIS — L97509 Non-pressure chronic ulcer of other part of unspecified foot with unspecified severity: Secondary | ICD-10-CM | POA: Diagnosis not present

## 2020-09-02 DIAGNOSIS — N186 End stage renal disease: Secondary | ICD-10-CM | POA: Diagnosis not present

## 2020-09-02 DIAGNOSIS — Z992 Dependence on renal dialysis: Secondary | ICD-10-CM | POA: Diagnosis not present

## 2020-09-03 DIAGNOSIS — Z992 Dependence on renal dialysis: Secondary | ICD-10-CM | POA: Diagnosis not present

## 2020-09-03 DIAGNOSIS — N2581 Secondary hyperparathyroidism of renal origin: Secondary | ICD-10-CM | POA: Diagnosis not present

## 2020-09-03 DIAGNOSIS — D509 Iron deficiency anemia, unspecified: Secondary | ICD-10-CM | POA: Diagnosis not present

## 2020-09-03 DIAGNOSIS — D631 Anemia in chronic kidney disease: Secondary | ICD-10-CM | POA: Diagnosis not present

## 2020-09-03 DIAGNOSIS — N186 End stage renal disease: Secondary | ICD-10-CM | POA: Diagnosis not present

## 2020-09-04 DIAGNOSIS — N186 End stage renal disease: Secondary | ICD-10-CM | POA: Diagnosis not present

## 2020-09-04 DIAGNOSIS — D631 Anemia in chronic kidney disease: Secondary | ICD-10-CM | POA: Diagnosis not present

## 2020-09-04 DIAGNOSIS — N2581 Secondary hyperparathyroidism of renal origin: Secondary | ICD-10-CM | POA: Diagnosis not present

## 2020-09-04 DIAGNOSIS — Z992 Dependence on renal dialysis: Secondary | ICD-10-CM | POA: Diagnosis not present

## 2020-09-04 DIAGNOSIS — D509 Iron deficiency anemia, unspecified: Secondary | ICD-10-CM | POA: Diagnosis not present

## 2020-09-05 DIAGNOSIS — N186 End stage renal disease: Secondary | ICD-10-CM | POA: Diagnosis not present

## 2020-09-05 DIAGNOSIS — D631 Anemia in chronic kidney disease: Secondary | ICD-10-CM | POA: Diagnosis not present

## 2020-09-05 DIAGNOSIS — D509 Iron deficiency anemia, unspecified: Secondary | ICD-10-CM | POA: Diagnosis not present

## 2020-09-05 DIAGNOSIS — Z992 Dependence on renal dialysis: Secondary | ICD-10-CM | POA: Diagnosis not present

## 2020-09-05 DIAGNOSIS — N2581 Secondary hyperparathyroidism of renal origin: Secondary | ICD-10-CM | POA: Diagnosis not present

## 2020-09-06 DIAGNOSIS — D631 Anemia in chronic kidney disease: Secondary | ICD-10-CM | POA: Diagnosis not present

## 2020-09-06 DIAGNOSIS — Z992 Dependence on renal dialysis: Secondary | ICD-10-CM | POA: Diagnosis not present

## 2020-09-06 DIAGNOSIS — N186 End stage renal disease: Secondary | ICD-10-CM | POA: Diagnosis not present

## 2020-09-06 DIAGNOSIS — D509 Iron deficiency anemia, unspecified: Secondary | ICD-10-CM | POA: Diagnosis not present

## 2020-09-06 DIAGNOSIS — N2581 Secondary hyperparathyroidism of renal origin: Secondary | ICD-10-CM | POA: Diagnosis not present

## 2020-09-07 DIAGNOSIS — Z992 Dependence on renal dialysis: Secondary | ICD-10-CM | POA: Diagnosis not present

## 2020-09-07 DIAGNOSIS — N186 End stage renal disease: Secondary | ICD-10-CM | POA: Diagnosis not present

## 2020-09-07 DIAGNOSIS — N2581 Secondary hyperparathyroidism of renal origin: Secondary | ICD-10-CM | POA: Diagnosis not present

## 2020-09-07 DIAGNOSIS — D509 Iron deficiency anemia, unspecified: Secondary | ICD-10-CM | POA: Diagnosis not present

## 2020-09-07 DIAGNOSIS — E1122 Type 2 diabetes mellitus with diabetic chronic kidney disease: Secondary | ICD-10-CM | POA: Diagnosis not present

## 2020-09-07 DIAGNOSIS — D631 Anemia in chronic kidney disease: Secondary | ICD-10-CM | POA: Diagnosis not present

## 2020-09-08 DIAGNOSIS — Z992 Dependence on renal dialysis: Secondary | ICD-10-CM | POA: Diagnosis not present

## 2020-09-08 DIAGNOSIS — N2581 Secondary hyperparathyroidism of renal origin: Secondary | ICD-10-CM | POA: Diagnosis not present

## 2020-09-08 DIAGNOSIS — E440 Moderate protein-calorie malnutrition: Secondary | ICD-10-CM | POA: Diagnosis not present

## 2020-09-08 DIAGNOSIS — R17 Unspecified jaundice: Secondary | ICD-10-CM | POA: Diagnosis not present

## 2020-09-08 DIAGNOSIS — D509 Iron deficiency anemia, unspecified: Secondary | ICD-10-CM | POA: Diagnosis not present

## 2020-09-08 DIAGNOSIS — Z79899 Other long term (current) drug therapy: Secondary | ICD-10-CM | POA: Diagnosis not present

## 2020-09-08 DIAGNOSIS — N186 End stage renal disease: Secondary | ICD-10-CM | POA: Diagnosis not present

## 2020-09-08 DIAGNOSIS — D631 Anemia in chronic kidney disease: Secondary | ICD-10-CM | POA: Diagnosis not present

## 2020-09-08 DIAGNOSIS — N2589 Other disorders resulting from impaired renal tubular function: Secondary | ICD-10-CM | POA: Diagnosis not present

## 2020-09-09 DIAGNOSIS — Z79899 Other long term (current) drug therapy: Secondary | ICD-10-CM | POA: Diagnosis not present

## 2020-09-09 DIAGNOSIS — E440 Moderate protein-calorie malnutrition: Secondary | ICD-10-CM | POA: Diagnosis not present

## 2020-09-09 DIAGNOSIS — R17 Unspecified jaundice: Secondary | ICD-10-CM | POA: Diagnosis not present

## 2020-09-09 DIAGNOSIS — N2589 Other disorders resulting from impaired renal tubular function: Secondary | ICD-10-CM | POA: Diagnosis not present

## 2020-09-09 DIAGNOSIS — D509 Iron deficiency anemia, unspecified: Secondary | ICD-10-CM | POA: Diagnosis not present

## 2020-09-09 DIAGNOSIS — N2581 Secondary hyperparathyroidism of renal origin: Secondary | ICD-10-CM | POA: Diagnosis not present

## 2020-09-09 DIAGNOSIS — Z992 Dependence on renal dialysis: Secondary | ICD-10-CM | POA: Diagnosis not present

## 2020-09-09 DIAGNOSIS — D631 Anemia in chronic kidney disease: Secondary | ICD-10-CM | POA: Diagnosis not present

## 2020-09-09 DIAGNOSIS — N186 End stage renal disease: Secondary | ICD-10-CM | POA: Diagnosis not present

## 2020-09-10 DIAGNOSIS — Z79899 Other long term (current) drug therapy: Secondary | ICD-10-CM | POA: Diagnosis not present

## 2020-09-10 DIAGNOSIS — N186 End stage renal disease: Secondary | ICD-10-CM | POA: Diagnosis not present

## 2020-09-10 DIAGNOSIS — R17 Unspecified jaundice: Secondary | ICD-10-CM | POA: Diagnosis not present

## 2020-09-10 DIAGNOSIS — D509 Iron deficiency anemia, unspecified: Secondary | ICD-10-CM | POA: Diagnosis not present

## 2020-09-10 DIAGNOSIS — D631 Anemia in chronic kidney disease: Secondary | ICD-10-CM | POA: Diagnosis not present

## 2020-09-10 DIAGNOSIS — E440 Moderate protein-calorie malnutrition: Secondary | ICD-10-CM | POA: Diagnosis not present

## 2020-09-10 DIAGNOSIS — Z992 Dependence on renal dialysis: Secondary | ICD-10-CM | POA: Diagnosis not present

## 2020-09-10 DIAGNOSIS — N2581 Secondary hyperparathyroidism of renal origin: Secondary | ICD-10-CM | POA: Diagnosis not present

## 2020-09-10 DIAGNOSIS — N2589 Other disorders resulting from impaired renal tubular function: Secondary | ICD-10-CM | POA: Diagnosis not present

## 2020-09-11 DIAGNOSIS — Z79899 Other long term (current) drug therapy: Secondary | ICD-10-CM | POA: Diagnosis not present

## 2020-09-11 DIAGNOSIS — N186 End stage renal disease: Secondary | ICD-10-CM | POA: Diagnosis not present

## 2020-09-11 DIAGNOSIS — N2589 Other disorders resulting from impaired renal tubular function: Secondary | ICD-10-CM | POA: Diagnosis not present

## 2020-09-11 DIAGNOSIS — R17 Unspecified jaundice: Secondary | ICD-10-CM | POA: Diagnosis not present

## 2020-09-11 DIAGNOSIS — Z992 Dependence on renal dialysis: Secondary | ICD-10-CM | POA: Diagnosis not present

## 2020-09-11 DIAGNOSIS — E440 Moderate protein-calorie malnutrition: Secondary | ICD-10-CM | POA: Diagnosis not present

## 2020-09-11 DIAGNOSIS — D509 Iron deficiency anemia, unspecified: Secondary | ICD-10-CM | POA: Diagnosis not present

## 2020-09-11 DIAGNOSIS — D631 Anemia in chronic kidney disease: Secondary | ICD-10-CM | POA: Diagnosis not present

## 2020-09-11 DIAGNOSIS — N2581 Secondary hyperparathyroidism of renal origin: Secondary | ICD-10-CM | POA: Diagnosis not present

## 2020-09-12 DIAGNOSIS — E440 Moderate protein-calorie malnutrition: Secondary | ICD-10-CM | POA: Diagnosis not present

## 2020-09-12 DIAGNOSIS — Z79899 Other long term (current) drug therapy: Secondary | ICD-10-CM | POA: Diagnosis not present

## 2020-09-12 DIAGNOSIS — N2589 Other disorders resulting from impaired renal tubular function: Secondary | ICD-10-CM | POA: Diagnosis not present

## 2020-09-12 DIAGNOSIS — R17 Unspecified jaundice: Secondary | ICD-10-CM | POA: Diagnosis not present

## 2020-09-12 DIAGNOSIS — Z992 Dependence on renal dialysis: Secondary | ICD-10-CM | POA: Diagnosis not present

## 2020-09-12 DIAGNOSIS — N186 End stage renal disease: Secondary | ICD-10-CM | POA: Diagnosis not present

## 2020-09-12 DIAGNOSIS — D631 Anemia in chronic kidney disease: Secondary | ICD-10-CM | POA: Diagnosis not present

## 2020-09-12 DIAGNOSIS — N2581 Secondary hyperparathyroidism of renal origin: Secondary | ICD-10-CM | POA: Diagnosis not present

## 2020-09-12 DIAGNOSIS — D509 Iron deficiency anemia, unspecified: Secondary | ICD-10-CM | POA: Diagnosis not present

## 2020-09-13 DIAGNOSIS — N2589 Other disorders resulting from impaired renal tubular function: Secondary | ICD-10-CM | POA: Diagnosis not present

## 2020-09-13 DIAGNOSIS — R17 Unspecified jaundice: Secondary | ICD-10-CM | POA: Diagnosis not present

## 2020-09-13 DIAGNOSIS — Z992 Dependence on renal dialysis: Secondary | ICD-10-CM | POA: Diagnosis not present

## 2020-09-13 DIAGNOSIS — N2581 Secondary hyperparathyroidism of renal origin: Secondary | ICD-10-CM | POA: Diagnosis not present

## 2020-09-13 DIAGNOSIS — D631 Anemia in chronic kidney disease: Secondary | ICD-10-CM | POA: Diagnosis not present

## 2020-09-13 DIAGNOSIS — D509 Iron deficiency anemia, unspecified: Secondary | ICD-10-CM | POA: Diagnosis not present

## 2020-09-13 DIAGNOSIS — N186 End stage renal disease: Secondary | ICD-10-CM | POA: Diagnosis not present

## 2020-09-13 DIAGNOSIS — E440 Moderate protein-calorie malnutrition: Secondary | ICD-10-CM | POA: Diagnosis not present

## 2020-09-13 DIAGNOSIS — Z79899 Other long term (current) drug therapy: Secondary | ICD-10-CM | POA: Diagnosis not present

## 2020-09-14 DIAGNOSIS — E440 Moderate protein-calorie malnutrition: Secondary | ICD-10-CM | POA: Diagnosis not present

## 2020-09-14 DIAGNOSIS — Z992 Dependence on renal dialysis: Secondary | ICD-10-CM | POA: Diagnosis not present

## 2020-09-14 DIAGNOSIS — D509 Iron deficiency anemia, unspecified: Secondary | ICD-10-CM | POA: Diagnosis not present

## 2020-09-14 DIAGNOSIS — D631 Anemia in chronic kidney disease: Secondary | ICD-10-CM | POA: Diagnosis not present

## 2020-09-14 DIAGNOSIS — N2581 Secondary hyperparathyroidism of renal origin: Secondary | ICD-10-CM | POA: Diagnosis not present

## 2020-09-14 DIAGNOSIS — N2589 Other disorders resulting from impaired renal tubular function: Secondary | ICD-10-CM | POA: Diagnosis not present

## 2020-09-14 DIAGNOSIS — N186 End stage renal disease: Secondary | ICD-10-CM | POA: Diagnosis not present

## 2020-09-14 DIAGNOSIS — Z79899 Other long term (current) drug therapy: Secondary | ICD-10-CM | POA: Diagnosis not present

## 2020-09-14 DIAGNOSIS — R17 Unspecified jaundice: Secondary | ICD-10-CM | POA: Diagnosis not present

## 2020-09-15 DIAGNOSIS — D631 Anemia in chronic kidney disease: Secondary | ICD-10-CM | POA: Diagnosis not present

## 2020-09-15 DIAGNOSIS — N2589 Other disorders resulting from impaired renal tubular function: Secondary | ICD-10-CM | POA: Diagnosis not present

## 2020-09-15 DIAGNOSIS — N186 End stage renal disease: Secondary | ICD-10-CM | POA: Diagnosis not present

## 2020-09-15 DIAGNOSIS — D509 Iron deficiency anemia, unspecified: Secondary | ICD-10-CM | POA: Diagnosis not present

## 2020-09-15 DIAGNOSIS — R17 Unspecified jaundice: Secondary | ICD-10-CM | POA: Diagnosis not present

## 2020-09-15 DIAGNOSIS — N2581 Secondary hyperparathyroidism of renal origin: Secondary | ICD-10-CM | POA: Diagnosis not present

## 2020-09-15 DIAGNOSIS — E440 Moderate protein-calorie malnutrition: Secondary | ICD-10-CM | POA: Diagnosis not present

## 2020-09-15 DIAGNOSIS — Z992 Dependence on renal dialysis: Secondary | ICD-10-CM | POA: Diagnosis not present

## 2020-09-15 DIAGNOSIS — Z79899 Other long term (current) drug therapy: Secondary | ICD-10-CM | POA: Diagnosis not present

## 2020-09-16 DIAGNOSIS — D509 Iron deficiency anemia, unspecified: Secondary | ICD-10-CM | POA: Diagnosis not present

## 2020-09-16 DIAGNOSIS — D631 Anemia in chronic kidney disease: Secondary | ICD-10-CM | POA: Diagnosis not present

## 2020-09-16 DIAGNOSIS — L97509 Non-pressure chronic ulcer of other part of unspecified foot with unspecified severity: Secondary | ICD-10-CM | POA: Diagnosis not present

## 2020-09-16 DIAGNOSIS — Z79899 Other long term (current) drug therapy: Secondary | ICD-10-CM | POA: Diagnosis not present

## 2020-09-16 DIAGNOSIS — N186 End stage renal disease: Secondary | ICD-10-CM | POA: Diagnosis not present

## 2020-09-16 DIAGNOSIS — L97522 Non-pressure chronic ulcer of other part of left foot with fat layer exposed: Secondary | ICD-10-CM | POA: Diagnosis not present

## 2020-09-16 DIAGNOSIS — N2589 Other disorders resulting from impaired renal tubular function: Secondary | ICD-10-CM | POA: Diagnosis not present

## 2020-09-16 DIAGNOSIS — E11621 Type 2 diabetes mellitus with foot ulcer: Secondary | ICD-10-CM | POA: Diagnosis not present

## 2020-09-16 DIAGNOSIS — M14672 Charcot's joint, left ankle and foot: Secondary | ICD-10-CM | POA: Diagnosis not present

## 2020-09-16 DIAGNOSIS — Z992 Dependence on renal dialysis: Secondary | ICD-10-CM | POA: Diagnosis not present

## 2020-09-16 DIAGNOSIS — N2581 Secondary hyperparathyroidism of renal origin: Secondary | ICD-10-CM | POA: Diagnosis not present

## 2020-09-16 DIAGNOSIS — L97511 Non-pressure chronic ulcer of other part of right foot limited to breakdown of skin: Secondary | ICD-10-CM | POA: Diagnosis not present

## 2020-09-16 DIAGNOSIS — R17 Unspecified jaundice: Secondary | ICD-10-CM | POA: Diagnosis not present

## 2020-09-16 DIAGNOSIS — E440 Moderate protein-calorie malnutrition: Secondary | ICD-10-CM | POA: Diagnosis not present

## 2020-09-17 DIAGNOSIS — N2589 Other disorders resulting from impaired renal tubular function: Secondary | ICD-10-CM | POA: Diagnosis not present

## 2020-09-17 DIAGNOSIS — D631 Anemia in chronic kidney disease: Secondary | ICD-10-CM | POA: Diagnosis not present

## 2020-09-17 DIAGNOSIS — D509 Iron deficiency anemia, unspecified: Secondary | ICD-10-CM | POA: Diagnosis not present

## 2020-09-17 DIAGNOSIS — Z79899 Other long term (current) drug therapy: Secondary | ICD-10-CM | POA: Diagnosis not present

## 2020-09-17 DIAGNOSIS — R17 Unspecified jaundice: Secondary | ICD-10-CM | POA: Diagnosis not present

## 2020-09-17 DIAGNOSIS — N2581 Secondary hyperparathyroidism of renal origin: Secondary | ICD-10-CM | POA: Diagnosis not present

## 2020-09-17 DIAGNOSIS — N186 End stage renal disease: Secondary | ICD-10-CM | POA: Diagnosis not present

## 2020-09-17 DIAGNOSIS — E440 Moderate protein-calorie malnutrition: Secondary | ICD-10-CM | POA: Diagnosis not present

## 2020-09-17 DIAGNOSIS — Z992 Dependence on renal dialysis: Secondary | ICD-10-CM | POA: Diagnosis not present

## 2020-09-18 DIAGNOSIS — N186 End stage renal disease: Secondary | ICD-10-CM | POA: Diagnosis not present

## 2020-09-18 DIAGNOSIS — Z79899 Other long term (current) drug therapy: Secondary | ICD-10-CM | POA: Diagnosis not present

## 2020-09-18 DIAGNOSIS — R17 Unspecified jaundice: Secondary | ICD-10-CM | POA: Diagnosis not present

## 2020-09-18 DIAGNOSIS — E440 Moderate protein-calorie malnutrition: Secondary | ICD-10-CM | POA: Diagnosis not present

## 2020-09-18 DIAGNOSIS — Z992 Dependence on renal dialysis: Secondary | ICD-10-CM | POA: Diagnosis not present

## 2020-09-18 DIAGNOSIS — N2589 Other disorders resulting from impaired renal tubular function: Secondary | ICD-10-CM | POA: Diagnosis not present

## 2020-09-18 DIAGNOSIS — D631 Anemia in chronic kidney disease: Secondary | ICD-10-CM | POA: Diagnosis not present

## 2020-09-18 DIAGNOSIS — N2581 Secondary hyperparathyroidism of renal origin: Secondary | ICD-10-CM | POA: Diagnosis not present

## 2020-09-18 DIAGNOSIS — D509 Iron deficiency anemia, unspecified: Secondary | ICD-10-CM | POA: Diagnosis not present

## 2020-09-19 DIAGNOSIS — E440 Moderate protein-calorie malnutrition: Secondary | ICD-10-CM | POA: Diagnosis not present

## 2020-09-19 DIAGNOSIS — N2581 Secondary hyperparathyroidism of renal origin: Secondary | ICD-10-CM | POA: Diagnosis not present

## 2020-09-19 DIAGNOSIS — D509 Iron deficiency anemia, unspecified: Secondary | ICD-10-CM | POA: Diagnosis not present

## 2020-09-19 DIAGNOSIS — N2589 Other disorders resulting from impaired renal tubular function: Secondary | ICD-10-CM | POA: Diagnosis not present

## 2020-09-19 DIAGNOSIS — D631 Anemia in chronic kidney disease: Secondary | ICD-10-CM | POA: Diagnosis not present

## 2020-09-19 DIAGNOSIS — Z992 Dependence on renal dialysis: Secondary | ICD-10-CM | POA: Diagnosis not present

## 2020-09-19 DIAGNOSIS — R17 Unspecified jaundice: Secondary | ICD-10-CM | POA: Diagnosis not present

## 2020-09-19 DIAGNOSIS — N186 End stage renal disease: Secondary | ICD-10-CM | POA: Diagnosis not present

## 2020-09-19 DIAGNOSIS — Z79899 Other long term (current) drug therapy: Secondary | ICD-10-CM | POA: Diagnosis not present

## 2020-09-20 DIAGNOSIS — N2581 Secondary hyperparathyroidism of renal origin: Secondary | ICD-10-CM | POA: Diagnosis not present

## 2020-09-20 DIAGNOSIS — N186 End stage renal disease: Secondary | ICD-10-CM | POA: Diagnosis not present

## 2020-09-20 DIAGNOSIS — E440 Moderate protein-calorie malnutrition: Secondary | ICD-10-CM | POA: Diagnosis not present

## 2020-09-20 DIAGNOSIS — D631 Anemia in chronic kidney disease: Secondary | ICD-10-CM | POA: Diagnosis not present

## 2020-09-20 DIAGNOSIS — D509 Iron deficiency anemia, unspecified: Secondary | ICD-10-CM | POA: Diagnosis not present

## 2020-09-20 DIAGNOSIS — Z992 Dependence on renal dialysis: Secondary | ICD-10-CM | POA: Diagnosis not present

## 2020-09-20 DIAGNOSIS — R17 Unspecified jaundice: Secondary | ICD-10-CM | POA: Diagnosis not present

## 2020-09-20 DIAGNOSIS — N2589 Other disorders resulting from impaired renal tubular function: Secondary | ICD-10-CM | POA: Diagnosis not present

## 2020-09-20 DIAGNOSIS — Z79899 Other long term (current) drug therapy: Secondary | ICD-10-CM | POA: Diagnosis not present

## 2020-09-21 DIAGNOSIS — N2581 Secondary hyperparathyroidism of renal origin: Secondary | ICD-10-CM | POA: Diagnosis not present

## 2020-09-21 DIAGNOSIS — N186 End stage renal disease: Secondary | ICD-10-CM | POA: Diagnosis not present

## 2020-09-21 DIAGNOSIS — N2589 Other disorders resulting from impaired renal tubular function: Secondary | ICD-10-CM | POA: Diagnosis not present

## 2020-09-21 DIAGNOSIS — D631 Anemia in chronic kidney disease: Secondary | ICD-10-CM | POA: Diagnosis not present

## 2020-09-21 DIAGNOSIS — E440 Moderate protein-calorie malnutrition: Secondary | ICD-10-CM | POA: Diagnosis not present

## 2020-09-21 DIAGNOSIS — Z992 Dependence on renal dialysis: Secondary | ICD-10-CM | POA: Diagnosis not present

## 2020-09-21 DIAGNOSIS — G4733 Obstructive sleep apnea (adult) (pediatric): Secondary | ICD-10-CM | POA: Diagnosis not present

## 2020-09-21 DIAGNOSIS — Z79899 Other long term (current) drug therapy: Secondary | ICD-10-CM | POA: Diagnosis not present

## 2020-09-21 DIAGNOSIS — R17 Unspecified jaundice: Secondary | ICD-10-CM | POA: Diagnosis not present

## 2020-09-21 DIAGNOSIS — D509 Iron deficiency anemia, unspecified: Secondary | ICD-10-CM | POA: Diagnosis not present

## 2020-09-22 DIAGNOSIS — D631 Anemia in chronic kidney disease: Secondary | ICD-10-CM | POA: Diagnosis not present

## 2020-09-22 DIAGNOSIS — N2581 Secondary hyperparathyroidism of renal origin: Secondary | ICD-10-CM | POA: Diagnosis not present

## 2020-09-22 DIAGNOSIS — E440 Moderate protein-calorie malnutrition: Secondary | ICD-10-CM | POA: Diagnosis not present

## 2020-09-22 DIAGNOSIS — Z992 Dependence on renal dialysis: Secondary | ICD-10-CM | POA: Diagnosis not present

## 2020-09-22 DIAGNOSIS — D509 Iron deficiency anemia, unspecified: Secondary | ICD-10-CM | POA: Diagnosis not present

## 2020-09-22 DIAGNOSIS — N2589 Other disorders resulting from impaired renal tubular function: Secondary | ICD-10-CM | POA: Diagnosis not present

## 2020-09-22 DIAGNOSIS — R17 Unspecified jaundice: Secondary | ICD-10-CM | POA: Diagnosis not present

## 2020-09-22 DIAGNOSIS — N186 End stage renal disease: Secondary | ICD-10-CM | POA: Diagnosis not present

## 2020-09-22 DIAGNOSIS — Z79899 Other long term (current) drug therapy: Secondary | ICD-10-CM | POA: Diagnosis not present

## 2020-09-23 DIAGNOSIS — D509 Iron deficiency anemia, unspecified: Secondary | ICD-10-CM | POA: Diagnosis not present

## 2020-09-23 DIAGNOSIS — N2581 Secondary hyperparathyroidism of renal origin: Secondary | ICD-10-CM | POA: Diagnosis not present

## 2020-09-23 DIAGNOSIS — N2589 Other disorders resulting from impaired renal tubular function: Secondary | ICD-10-CM | POA: Diagnosis not present

## 2020-09-23 DIAGNOSIS — Z79899 Other long term (current) drug therapy: Secondary | ICD-10-CM | POA: Diagnosis not present

## 2020-09-23 DIAGNOSIS — D631 Anemia in chronic kidney disease: Secondary | ICD-10-CM | POA: Diagnosis not present

## 2020-09-23 DIAGNOSIS — E440 Moderate protein-calorie malnutrition: Secondary | ICD-10-CM | POA: Diagnosis not present

## 2020-09-23 DIAGNOSIS — R17 Unspecified jaundice: Secondary | ICD-10-CM | POA: Diagnosis not present

## 2020-09-23 DIAGNOSIS — N186 End stage renal disease: Secondary | ICD-10-CM | POA: Diagnosis not present

## 2020-09-23 DIAGNOSIS — Z992 Dependence on renal dialysis: Secondary | ICD-10-CM | POA: Diagnosis not present

## 2020-09-24 DIAGNOSIS — N2581 Secondary hyperparathyroidism of renal origin: Secondary | ICD-10-CM | POA: Diagnosis not present

## 2020-09-24 DIAGNOSIS — E440 Moderate protein-calorie malnutrition: Secondary | ICD-10-CM | POA: Diagnosis not present

## 2020-09-24 DIAGNOSIS — N186 End stage renal disease: Secondary | ICD-10-CM | POA: Diagnosis not present

## 2020-09-24 DIAGNOSIS — Z992 Dependence on renal dialysis: Secondary | ICD-10-CM | POA: Diagnosis not present

## 2020-09-24 DIAGNOSIS — Z79899 Other long term (current) drug therapy: Secondary | ICD-10-CM | POA: Diagnosis not present

## 2020-09-24 DIAGNOSIS — N2589 Other disorders resulting from impaired renal tubular function: Secondary | ICD-10-CM | POA: Diagnosis not present

## 2020-09-24 DIAGNOSIS — D509 Iron deficiency anemia, unspecified: Secondary | ICD-10-CM | POA: Diagnosis not present

## 2020-09-24 DIAGNOSIS — D631 Anemia in chronic kidney disease: Secondary | ICD-10-CM | POA: Diagnosis not present

## 2020-09-24 DIAGNOSIS — R17 Unspecified jaundice: Secondary | ICD-10-CM | POA: Diagnosis not present

## 2020-09-25 DIAGNOSIS — Z992 Dependence on renal dialysis: Secondary | ICD-10-CM | POA: Diagnosis not present

## 2020-09-25 DIAGNOSIS — D509 Iron deficiency anemia, unspecified: Secondary | ICD-10-CM | POA: Diagnosis not present

## 2020-09-25 DIAGNOSIS — Z79899 Other long term (current) drug therapy: Secondary | ICD-10-CM | POA: Diagnosis not present

## 2020-09-25 DIAGNOSIS — N186 End stage renal disease: Secondary | ICD-10-CM | POA: Diagnosis not present

## 2020-09-25 DIAGNOSIS — N2589 Other disorders resulting from impaired renal tubular function: Secondary | ICD-10-CM | POA: Diagnosis not present

## 2020-09-25 DIAGNOSIS — D631 Anemia in chronic kidney disease: Secondary | ICD-10-CM | POA: Diagnosis not present

## 2020-09-25 DIAGNOSIS — R17 Unspecified jaundice: Secondary | ICD-10-CM | POA: Diagnosis not present

## 2020-09-25 DIAGNOSIS — N2581 Secondary hyperparathyroidism of renal origin: Secondary | ICD-10-CM | POA: Diagnosis not present

## 2020-09-25 DIAGNOSIS — E440 Moderate protein-calorie malnutrition: Secondary | ICD-10-CM | POA: Diagnosis not present

## 2020-09-26 DIAGNOSIS — E440 Moderate protein-calorie malnutrition: Secondary | ICD-10-CM | POA: Diagnosis not present

## 2020-09-26 DIAGNOSIS — D509 Iron deficiency anemia, unspecified: Secondary | ICD-10-CM | POA: Diagnosis not present

## 2020-09-26 DIAGNOSIS — N186 End stage renal disease: Secondary | ICD-10-CM | POA: Diagnosis not present

## 2020-09-26 DIAGNOSIS — N2589 Other disorders resulting from impaired renal tubular function: Secondary | ICD-10-CM | POA: Diagnosis not present

## 2020-09-26 DIAGNOSIS — N2581 Secondary hyperparathyroidism of renal origin: Secondary | ICD-10-CM | POA: Diagnosis not present

## 2020-09-26 DIAGNOSIS — Z992 Dependence on renal dialysis: Secondary | ICD-10-CM | POA: Diagnosis not present

## 2020-09-26 DIAGNOSIS — R17 Unspecified jaundice: Secondary | ICD-10-CM | POA: Diagnosis not present

## 2020-09-26 DIAGNOSIS — Z79899 Other long term (current) drug therapy: Secondary | ICD-10-CM | POA: Diagnosis not present

## 2020-09-26 DIAGNOSIS — D631 Anemia in chronic kidney disease: Secondary | ICD-10-CM | POA: Diagnosis not present

## 2020-09-27 DIAGNOSIS — N2589 Other disorders resulting from impaired renal tubular function: Secondary | ICD-10-CM | POA: Diagnosis not present

## 2020-09-27 DIAGNOSIS — D509 Iron deficiency anemia, unspecified: Secondary | ICD-10-CM | POA: Diagnosis not present

## 2020-09-27 DIAGNOSIS — Z992 Dependence on renal dialysis: Secondary | ICD-10-CM | POA: Diagnosis not present

## 2020-09-27 DIAGNOSIS — Z79899 Other long term (current) drug therapy: Secondary | ICD-10-CM | POA: Diagnosis not present

## 2020-09-27 DIAGNOSIS — D631 Anemia in chronic kidney disease: Secondary | ICD-10-CM | POA: Diagnosis not present

## 2020-09-27 DIAGNOSIS — N2581 Secondary hyperparathyroidism of renal origin: Secondary | ICD-10-CM | POA: Diagnosis not present

## 2020-09-27 DIAGNOSIS — E440 Moderate protein-calorie malnutrition: Secondary | ICD-10-CM | POA: Diagnosis not present

## 2020-09-27 DIAGNOSIS — R17 Unspecified jaundice: Secondary | ICD-10-CM | POA: Diagnosis not present

## 2020-09-27 DIAGNOSIS — N186 End stage renal disease: Secondary | ICD-10-CM | POA: Diagnosis not present

## 2020-09-28 DIAGNOSIS — N2581 Secondary hyperparathyroidism of renal origin: Secondary | ICD-10-CM | POA: Diagnosis not present

## 2020-09-28 DIAGNOSIS — Z992 Dependence on renal dialysis: Secondary | ICD-10-CM | POA: Diagnosis not present

## 2020-09-28 DIAGNOSIS — D509 Iron deficiency anemia, unspecified: Secondary | ICD-10-CM | POA: Diagnosis not present

## 2020-09-28 DIAGNOSIS — Z79899 Other long term (current) drug therapy: Secondary | ICD-10-CM | POA: Diagnosis not present

## 2020-09-28 DIAGNOSIS — N186 End stage renal disease: Secondary | ICD-10-CM | POA: Diagnosis not present

## 2020-09-28 DIAGNOSIS — E440 Moderate protein-calorie malnutrition: Secondary | ICD-10-CM | POA: Diagnosis not present

## 2020-09-28 DIAGNOSIS — N2589 Other disorders resulting from impaired renal tubular function: Secondary | ICD-10-CM | POA: Diagnosis not present

## 2020-09-28 DIAGNOSIS — D631 Anemia in chronic kidney disease: Secondary | ICD-10-CM | POA: Diagnosis not present

## 2020-09-28 DIAGNOSIS — R17 Unspecified jaundice: Secondary | ICD-10-CM | POA: Diagnosis not present

## 2020-09-29 DIAGNOSIS — N2581 Secondary hyperparathyroidism of renal origin: Secondary | ICD-10-CM | POA: Diagnosis not present

## 2020-09-29 DIAGNOSIS — N186 End stage renal disease: Secondary | ICD-10-CM | POA: Diagnosis not present

## 2020-09-29 DIAGNOSIS — D509 Iron deficiency anemia, unspecified: Secondary | ICD-10-CM | POA: Diagnosis not present

## 2020-09-29 DIAGNOSIS — D631 Anemia in chronic kidney disease: Secondary | ICD-10-CM | POA: Diagnosis not present

## 2020-09-29 DIAGNOSIS — Z992 Dependence on renal dialysis: Secondary | ICD-10-CM | POA: Diagnosis not present

## 2020-09-29 DIAGNOSIS — R17 Unspecified jaundice: Secondary | ICD-10-CM | POA: Diagnosis not present

## 2020-09-29 DIAGNOSIS — Z79899 Other long term (current) drug therapy: Secondary | ICD-10-CM | POA: Diagnosis not present

## 2020-09-29 DIAGNOSIS — E440 Moderate protein-calorie malnutrition: Secondary | ICD-10-CM | POA: Diagnosis not present

## 2020-09-29 DIAGNOSIS — N2589 Other disorders resulting from impaired renal tubular function: Secondary | ICD-10-CM | POA: Diagnosis not present

## 2020-09-30 DIAGNOSIS — Z992 Dependence on renal dialysis: Secondary | ICD-10-CM | POA: Diagnosis not present

## 2020-09-30 DIAGNOSIS — D509 Iron deficiency anemia, unspecified: Secondary | ICD-10-CM | POA: Diagnosis not present

## 2020-09-30 DIAGNOSIS — N2581 Secondary hyperparathyroidism of renal origin: Secondary | ICD-10-CM | POA: Diagnosis not present

## 2020-09-30 DIAGNOSIS — E440 Moderate protein-calorie malnutrition: Secondary | ICD-10-CM | POA: Diagnosis not present

## 2020-09-30 DIAGNOSIS — R17 Unspecified jaundice: Secondary | ICD-10-CM | POA: Diagnosis not present

## 2020-09-30 DIAGNOSIS — N2589 Other disorders resulting from impaired renal tubular function: Secondary | ICD-10-CM | POA: Diagnosis not present

## 2020-09-30 DIAGNOSIS — N186 End stage renal disease: Secondary | ICD-10-CM | POA: Diagnosis not present

## 2020-09-30 DIAGNOSIS — D631 Anemia in chronic kidney disease: Secondary | ICD-10-CM | POA: Diagnosis not present

## 2020-09-30 DIAGNOSIS — Z79899 Other long term (current) drug therapy: Secondary | ICD-10-CM | POA: Diagnosis not present

## 2020-10-01 DIAGNOSIS — E440 Moderate protein-calorie malnutrition: Secondary | ICD-10-CM | POA: Diagnosis not present

## 2020-10-01 DIAGNOSIS — R17 Unspecified jaundice: Secondary | ICD-10-CM | POA: Diagnosis not present

## 2020-10-01 DIAGNOSIS — Z992 Dependence on renal dialysis: Secondary | ICD-10-CM | POA: Diagnosis not present

## 2020-10-01 DIAGNOSIS — N2581 Secondary hyperparathyroidism of renal origin: Secondary | ICD-10-CM | POA: Diagnosis not present

## 2020-10-01 DIAGNOSIS — Z79899 Other long term (current) drug therapy: Secondary | ICD-10-CM | POA: Diagnosis not present

## 2020-10-01 DIAGNOSIS — D631 Anemia in chronic kidney disease: Secondary | ICD-10-CM | POA: Diagnosis not present

## 2020-10-01 DIAGNOSIS — N2589 Other disorders resulting from impaired renal tubular function: Secondary | ICD-10-CM | POA: Diagnosis not present

## 2020-10-01 DIAGNOSIS — N186 End stage renal disease: Secondary | ICD-10-CM | POA: Diagnosis not present

## 2020-10-01 DIAGNOSIS — D509 Iron deficiency anemia, unspecified: Secondary | ICD-10-CM | POA: Diagnosis not present

## 2020-10-02 DIAGNOSIS — E440 Moderate protein-calorie malnutrition: Secondary | ICD-10-CM | POA: Diagnosis not present

## 2020-10-02 DIAGNOSIS — D631 Anemia in chronic kidney disease: Secondary | ICD-10-CM | POA: Diagnosis not present

## 2020-10-02 DIAGNOSIS — Z992 Dependence on renal dialysis: Secondary | ICD-10-CM | POA: Diagnosis not present

## 2020-10-02 DIAGNOSIS — D509 Iron deficiency anemia, unspecified: Secondary | ICD-10-CM | POA: Diagnosis not present

## 2020-10-02 DIAGNOSIS — R17 Unspecified jaundice: Secondary | ICD-10-CM | POA: Diagnosis not present

## 2020-10-02 DIAGNOSIS — Z79899 Other long term (current) drug therapy: Secondary | ICD-10-CM | POA: Diagnosis not present

## 2020-10-02 DIAGNOSIS — N2589 Other disorders resulting from impaired renal tubular function: Secondary | ICD-10-CM | POA: Diagnosis not present

## 2020-10-02 DIAGNOSIS — N186 End stage renal disease: Secondary | ICD-10-CM | POA: Diagnosis not present

## 2020-10-02 DIAGNOSIS — N2581 Secondary hyperparathyroidism of renal origin: Secondary | ICD-10-CM | POA: Diagnosis not present

## 2020-10-03 DIAGNOSIS — N2581 Secondary hyperparathyroidism of renal origin: Secondary | ICD-10-CM | POA: Diagnosis not present

## 2020-10-03 DIAGNOSIS — Z992 Dependence on renal dialysis: Secondary | ICD-10-CM | POA: Diagnosis not present

## 2020-10-03 DIAGNOSIS — N2589 Other disorders resulting from impaired renal tubular function: Secondary | ICD-10-CM | POA: Diagnosis not present

## 2020-10-03 DIAGNOSIS — D631 Anemia in chronic kidney disease: Secondary | ICD-10-CM | POA: Diagnosis not present

## 2020-10-03 DIAGNOSIS — Z79899 Other long term (current) drug therapy: Secondary | ICD-10-CM | POA: Diagnosis not present

## 2020-10-03 DIAGNOSIS — N186 End stage renal disease: Secondary | ICD-10-CM | POA: Diagnosis not present

## 2020-10-03 DIAGNOSIS — R17 Unspecified jaundice: Secondary | ICD-10-CM | POA: Diagnosis not present

## 2020-10-03 DIAGNOSIS — E440 Moderate protein-calorie malnutrition: Secondary | ICD-10-CM | POA: Diagnosis not present

## 2020-10-03 DIAGNOSIS — D509 Iron deficiency anemia, unspecified: Secondary | ICD-10-CM | POA: Diagnosis not present

## 2020-10-04 DIAGNOSIS — N186 End stage renal disease: Secondary | ICD-10-CM | POA: Diagnosis not present

## 2020-10-04 DIAGNOSIS — N2589 Other disorders resulting from impaired renal tubular function: Secondary | ICD-10-CM | POA: Diagnosis not present

## 2020-10-04 DIAGNOSIS — E440 Moderate protein-calorie malnutrition: Secondary | ICD-10-CM | POA: Diagnosis not present

## 2020-10-04 DIAGNOSIS — R17 Unspecified jaundice: Secondary | ICD-10-CM | POA: Diagnosis not present

## 2020-10-04 DIAGNOSIS — Z79899 Other long term (current) drug therapy: Secondary | ICD-10-CM | POA: Diagnosis not present

## 2020-10-04 DIAGNOSIS — D509 Iron deficiency anemia, unspecified: Secondary | ICD-10-CM | POA: Diagnosis not present

## 2020-10-04 DIAGNOSIS — D631 Anemia in chronic kidney disease: Secondary | ICD-10-CM | POA: Diagnosis not present

## 2020-10-04 DIAGNOSIS — Z992 Dependence on renal dialysis: Secondary | ICD-10-CM | POA: Diagnosis not present

## 2020-10-04 DIAGNOSIS — N2581 Secondary hyperparathyroidism of renal origin: Secondary | ICD-10-CM | POA: Diagnosis not present

## 2020-10-05 DIAGNOSIS — E440 Moderate protein-calorie malnutrition: Secondary | ICD-10-CM | POA: Diagnosis not present

## 2020-10-05 DIAGNOSIS — R17 Unspecified jaundice: Secondary | ICD-10-CM | POA: Diagnosis not present

## 2020-10-05 DIAGNOSIS — Z79899 Other long term (current) drug therapy: Secondary | ICD-10-CM | POA: Diagnosis not present

## 2020-10-05 DIAGNOSIS — Z992 Dependence on renal dialysis: Secondary | ICD-10-CM | POA: Diagnosis not present

## 2020-10-05 DIAGNOSIS — N2581 Secondary hyperparathyroidism of renal origin: Secondary | ICD-10-CM | POA: Diagnosis not present

## 2020-10-05 DIAGNOSIS — D509 Iron deficiency anemia, unspecified: Secondary | ICD-10-CM | POA: Diagnosis not present

## 2020-10-05 DIAGNOSIS — D631 Anemia in chronic kidney disease: Secondary | ICD-10-CM | POA: Diagnosis not present

## 2020-10-05 DIAGNOSIS — N2589 Other disorders resulting from impaired renal tubular function: Secondary | ICD-10-CM | POA: Diagnosis not present

## 2020-10-05 DIAGNOSIS — N186 End stage renal disease: Secondary | ICD-10-CM | POA: Diagnosis not present

## 2020-10-06 DIAGNOSIS — D509 Iron deficiency anemia, unspecified: Secondary | ICD-10-CM | POA: Diagnosis not present

## 2020-10-06 DIAGNOSIS — N186 End stage renal disease: Secondary | ICD-10-CM | POA: Diagnosis not present

## 2020-10-06 DIAGNOSIS — Z79899 Other long term (current) drug therapy: Secondary | ICD-10-CM | POA: Diagnosis not present

## 2020-10-06 DIAGNOSIS — N2581 Secondary hyperparathyroidism of renal origin: Secondary | ICD-10-CM | POA: Diagnosis not present

## 2020-10-06 DIAGNOSIS — Z992 Dependence on renal dialysis: Secondary | ICD-10-CM | POA: Diagnosis not present

## 2020-10-06 DIAGNOSIS — D631 Anemia in chronic kidney disease: Secondary | ICD-10-CM | POA: Diagnosis not present

## 2020-10-06 DIAGNOSIS — E440 Moderate protein-calorie malnutrition: Secondary | ICD-10-CM | POA: Diagnosis not present

## 2020-10-06 DIAGNOSIS — R17 Unspecified jaundice: Secondary | ICD-10-CM | POA: Diagnosis not present

## 2020-10-06 DIAGNOSIS — N2589 Other disorders resulting from impaired renal tubular function: Secondary | ICD-10-CM | POA: Diagnosis not present

## 2020-10-07 DIAGNOSIS — N186 End stage renal disease: Secondary | ICD-10-CM | POA: Diagnosis not present

## 2020-10-07 DIAGNOSIS — N2581 Secondary hyperparathyroidism of renal origin: Secondary | ICD-10-CM | POA: Diagnosis not present

## 2020-10-07 DIAGNOSIS — Z79899 Other long term (current) drug therapy: Secondary | ICD-10-CM | POA: Diagnosis not present

## 2020-10-07 DIAGNOSIS — E1122 Type 2 diabetes mellitus with diabetic chronic kidney disease: Secondary | ICD-10-CM | POA: Diagnosis not present

## 2020-10-07 DIAGNOSIS — D631 Anemia in chronic kidney disease: Secondary | ICD-10-CM | POA: Diagnosis not present

## 2020-10-07 DIAGNOSIS — D509 Iron deficiency anemia, unspecified: Secondary | ICD-10-CM | POA: Diagnosis not present

## 2020-10-07 DIAGNOSIS — N2589 Other disorders resulting from impaired renal tubular function: Secondary | ICD-10-CM | POA: Diagnosis not present

## 2020-10-07 DIAGNOSIS — R17 Unspecified jaundice: Secondary | ICD-10-CM | POA: Diagnosis not present

## 2020-10-07 DIAGNOSIS — E440 Moderate protein-calorie malnutrition: Secondary | ICD-10-CM | POA: Diagnosis not present

## 2020-10-07 DIAGNOSIS — Z992 Dependence on renal dialysis: Secondary | ICD-10-CM | POA: Diagnosis not present

## 2020-10-08 DIAGNOSIS — N2581 Secondary hyperparathyroidism of renal origin: Secondary | ICD-10-CM | POA: Diagnosis not present

## 2020-10-08 DIAGNOSIS — N2589 Other disorders resulting from impaired renal tubular function: Secondary | ICD-10-CM | POA: Diagnosis not present

## 2020-10-08 DIAGNOSIS — E1122 Type 2 diabetes mellitus with diabetic chronic kidney disease: Secondary | ICD-10-CM | POA: Diagnosis not present

## 2020-10-08 DIAGNOSIS — D631 Anemia in chronic kidney disease: Secondary | ICD-10-CM | POA: Diagnosis not present

## 2020-10-08 DIAGNOSIS — Z992 Dependence on renal dialysis: Secondary | ICD-10-CM | POA: Diagnosis not present

## 2020-10-08 DIAGNOSIS — D509 Iron deficiency anemia, unspecified: Secondary | ICD-10-CM | POA: Diagnosis not present

## 2020-10-08 DIAGNOSIS — N186 End stage renal disease: Secondary | ICD-10-CM | POA: Diagnosis not present

## 2020-10-09 DIAGNOSIS — Z89422 Acquired absence of other left toe(s): Secondary | ICD-10-CM | POA: Diagnosis not present

## 2020-10-09 DIAGNOSIS — L97511 Non-pressure chronic ulcer of other part of right foot limited to breakdown of skin: Secondary | ICD-10-CM | POA: Diagnosis not present

## 2020-10-09 DIAGNOSIS — D631 Anemia in chronic kidney disease: Secondary | ICD-10-CM | POA: Diagnosis not present

## 2020-10-09 DIAGNOSIS — N186 End stage renal disease: Secondary | ICD-10-CM | POA: Diagnosis not present

## 2020-10-09 DIAGNOSIS — Z794 Long term (current) use of insulin: Secondary | ICD-10-CM | POA: Diagnosis not present

## 2020-10-09 DIAGNOSIS — D509 Iron deficiency anemia, unspecified: Secondary | ICD-10-CM | POA: Diagnosis not present

## 2020-10-09 DIAGNOSIS — E1122 Type 2 diabetes mellitus with diabetic chronic kidney disease: Secondary | ICD-10-CM | POA: Diagnosis not present

## 2020-10-09 DIAGNOSIS — Z992 Dependence on renal dialysis: Secondary | ICD-10-CM | POA: Diagnosis not present

## 2020-10-09 DIAGNOSIS — E11621 Type 2 diabetes mellitus with foot ulcer: Secondary | ICD-10-CM | POA: Diagnosis not present

## 2020-10-09 DIAGNOSIS — L97522 Non-pressure chronic ulcer of other part of left foot with fat layer exposed: Secondary | ICD-10-CM | POA: Diagnosis not present

## 2020-10-09 DIAGNOSIS — N2581 Secondary hyperparathyroidism of renal origin: Secondary | ICD-10-CM | POA: Diagnosis not present

## 2020-10-09 DIAGNOSIS — N2589 Other disorders resulting from impaired renal tubular function: Secondary | ICD-10-CM | POA: Diagnosis not present

## 2020-10-10 DIAGNOSIS — N186 End stage renal disease: Secondary | ICD-10-CM | POA: Diagnosis not present

## 2020-10-10 DIAGNOSIS — N2589 Other disorders resulting from impaired renal tubular function: Secondary | ICD-10-CM | POA: Diagnosis not present

## 2020-10-10 DIAGNOSIS — Z992 Dependence on renal dialysis: Secondary | ICD-10-CM | POA: Diagnosis not present

## 2020-10-10 DIAGNOSIS — E1122 Type 2 diabetes mellitus with diabetic chronic kidney disease: Secondary | ICD-10-CM | POA: Diagnosis not present

## 2020-10-10 DIAGNOSIS — N2581 Secondary hyperparathyroidism of renal origin: Secondary | ICD-10-CM | POA: Diagnosis not present

## 2020-10-10 DIAGNOSIS — D509 Iron deficiency anemia, unspecified: Secondary | ICD-10-CM | POA: Diagnosis not present

## 2020-10-10 DIAGNOSIS — D631 Anemia in chronic kidney disease: Secondary | ICD-10-CM | POA: Diagnosis not present

## 2020-10-11 DIAGNOSIS — N2581 Secondary hyperparathyroidism of renal origin: Secondary | ICD-10-CM | POA: Diagnosis not present

## 2020-10-11 DIAGNOSIS — E1122 Type 2 diabetes mellitus with diabetic chronic kidney disease: Secondary | ICD-10-CM | POA: Diagnosis not present

## 2020-10-11 DIAGNOSIS — N186 End stage renal disease: Secondary | ICD-10-CM | POA: Diagnosis not present

## 2020-10-11 DIAGNOSIS — Z992 Dependence on renal dialysis: Secondary | ICD-10-CM | POA: Diagnosis not present

## 2020-10-11 DIAGNOSIS — D509 Iron deficiency anemia, unspecified: Secondary | ICD-10-CM | POA: Diagnosis not present

## 2020-10-11 DIAGNOSIS — N2589 Other disorders resulting from impaired renal tubular function: Secondary | ICD-10-CM | POA: Diagnosis not present

## 2020-10-11 DIAGNOSIS — D631 Anemia in chronic kidney disease: Secondary | ICD-10-CM | POA: Diagnosis not present

## 2020-10-12 DIAGNOSIS — Z992 Dependence on renal dialysis: Secondary | ICD-10-CM | POA: Diagnosis not present

## 2020-10-12 DIAGNOSIS — E1122 Type 2 diabetes mellitus with diabetic chronic kidney disease: Secondary | ICD-10-CM | POA: Diagnosis not present

## 2020-10-12 DIAGNOSIS — N2589 Other disorders resulting from impaired renal tubular function: Secondary | ICD-10-CM | POA: Diagnosis not present

## 2020-10-12 DIAGNOSIS — N186 End stage renal disease: Secondary | ICD-10-CM | POA: Diagnosis not present

## 2020-10-12 DIAGNOSIS — N2581 Secondary hyperparathyroidism of renal origin: Secondary | ICD-10-CM | POA: Diagnosis not present

## 2020-10-12 DIAGNOSIS — D631 Anemia in chronic kidney disease: Secondary | ICD-10-CM | POA: Diagnosis not present

## 2020-10-12 DIAGNOSIS — D509 Iron deficiency anemia, unspecified: Secondary | ICD-10-CM | POA: Diagnosis not present

## 2020-10-13 DIAGNOSIS — Z992 Dependence on renal dialysis: Secondary | ICD-10-CM | POA: Diagnosis not present

## 2020-10-13 DIAGNOSIS — N2581 Secondary hyperparathyroidism of renal origin: Secondary | ICD-10-CM | POA: Diagnosis not present

## 2020-10-13 DIAGNOSIS — E1122 Type 2 diabetes mellitus with diabetic chronic kidney disease: Secondary | ICD-10-CM | POA: Diagnosis not present

## 2020-10-13 DIAGNOSIS — N186 End stage renal disease: Secondary | ICD-10-CM | POA: Diagnosis not present

## 2020-10-13 DIAGNOSIS — D509 Iron deficiency anemia, unspecified: Secondary | ICD-10-CM | POA: Diagnosis not present

## 2020-10-13 DIAGNOSIS — N2589 Other disorders resulting from impaired renal tubular function: Secondary | ICD-10-CM | POA: Diagnosis not present

## 2020-10-13 DIAGNOSIS — D631 Anemia in chronic kidney disease: Secondary | ICD-10-CM | POA: Diagnosis not present

## 2020-10-14 DIAGNOSIS — D509 Iron deficiency anemia, unspecified: Secondary | ICD-10-CM | POA: Diagnosis not present

## 2020-10-14 DIAGNOSIS — E1122 Type 2 diabetes mellitus with diabetic chronic kidney disease: Secondary | ICD-10-CM | POA: Diagnosis not present

## 2020-10-14 DIAGNOSIS — N2581 Secondary hyperparathyroidism of renal origin: Secondary | ICD-10-CM | POA: Diagnosis not present

## 2020-10-14 DIAGNOSIS — N186 End stage renal disease: Secondary | ICD-10-CM | POA: Diagnosis not present

## 2020-10-14 DIAGNOSIS — Z992 Dependence on renal dialysis: Secondary | ICD-10-CM | POA: Diagnosis not present

## 2020-10-14 DIAGNOSIS — D631 Anemia in chronic kidney disease: Secondary | ICD-10-CM | POA: Diagnosis not present

## 2020-10-14 DIAGNOSIS — N2589 Other disorders resulting from impaired renal tubular function: Secondary | ICD-10-CM | POA: Diagnosis not present

## 2020-10-15 DIAGNOSIS — D631 Anemia in chronic kidney disease: Secondary | ICD-10-CM | POA: Diagnosis not present

## 2020-10-15 DIAGNOSIS — D509 Iron deficiency anemia, unspecified: Secondary | ICD-10-CM | POA: Diagnosis not present

## 2020-10-15 DIAGNOSIS — N2581 Secondary hyperparathyroidism of renal origin: Secondary | ICD-10-CM | POA: Diagnosis not present

## 2020-10-15 DIAGNOSIS — Z992 Dependence on renal dialysis: Secondary | ICD-10-CM | POA: Diagnosis not present

## 2020-10-15 DIAGNOSIS — N2589 Other disorders resulting from impaired renal tubular function: Secondary | ICD-10-CM | POA: Diagnosis not present

## 2020-10-15 DIAGNOSIS — E1122 Type 2 diabetes mellitus with diabetic chronic kidney disease: Secondary | ICD-10-CM | POA: Diagnosis not present

## 2020-10-15 DIAGNOSIS — N186 End stage renal disease: Secondary | ICD-10-CM | POA: Diagnosis not present

## 2020-10-16 DIAGNOSIS — N2581 Secondary hyperparathyroidism of renal origin: Secondary | ICD-10-CM | POA: Diagnosis not present

## 2020-10-16 DIAGNOSIS — Z992 Dependence on renal dialysis: Secondary | ICD-10-CM | POA: Diagnosis not present

## 2020-10-16 DIAGNOSIS — N186 End stage renal disease: Secondary | ICD-10-CM | POA: Diagnosis not present

## 2020-10-16 DIAGNOSIS — E1122 Type 2 diabetes mellitus with diabetic chronic kidney disease: Secondary | ICD-10-CM | POA: Diagnosis not present

## 2020-10-16 DIAGNOSIS — D509 Iron deficiency anemia, unspecified: Secondary | ICD-10-CM | POA: Diagnosis not present

## 2020-10-16 DIAGNOSIS — D631 Anemia in chronic kidney disease: Secondary | ICD-10-CM | POA: Diagnosis not present

## 2020-10-16 DIAGNOSIS — N2589 Other disorders resulting from impaired renal tubular function: Secondary | ICD-10-CM | POA: Diagnosis not present

## 2020-10-17 DIAGNOSIS — Z992 Dependence on renal dialysis: Secondary | ICD-10-CM | POA: Diagnosis not present

## 2020-10-17 DIAGNOSIS — D631 Anemia in chronic kidney disease: Secondary | ICD-10-CM | POA: Diagnosis not present

## 2020-10-17 DIAGNOSIS — N186 End stage renal disease: Secondary | ICD-10-CM | POA: Diagnosis not present

## 2020-10-17 DIAGNOSIS — N2589 Other disorders resulting from impaired renal tubular function: Secondary | ICD-10-CM | POA: Diagnosis not present

## 2020-10-17 DIAGNOSIS — N2581 Secondary hyperparathyroidism of renal origin: Secondary | ICD-10-CM | POA: Diagnosis not present

## 2020-10-17 DIAGNOSIS — D509 Iron deficiency anemia, unspecified: Secondary | ICD-10-CM | POA: Diagnosis not present

## 2020-10-17 DIAGNOSIS — E1122 Type 2 diabetes mellitus with diabetic chronic kidney disease: Secondary | ICD-10-CM | POA: Diagnosis not present

## 2020-10-18 DIAGNOSIS — E1122 Type 2 diabetes mellitus with diabetic chronic kidney disease: Secondary | ICD-10-CM | POA: Diagnosis not present

## 2020-10-18 DIAGNOSIS — D631 Anemia in chronic kidney disease: Secondary | ICD-10-CM | POA: Diagnosis not present

## 2020-10-18 DIAGNOSIS — N186 End stage renal disease: Secondary | ICD-10-CM | POA: Diagnosis not present

## 2020-10-18 DIAGNOSIS — N2589 Other disorders resulting from impaired renal tubular function: Secondary | ICD-10-CM | POA: Diagnosis not present

## 2020-10-18 DIAGNOSIS — D509 Iron deficiency anemia, unspecified: Secondary | ICD-10-CM | POA: Diagnosis not present

## 2020-10-18 DIAGNOSIS — Z992 Dependence on renal dialysis: Secondary | ICD-10-CM | POA: Diagnosis not present

## 2020-10-18 DIAGNOSIS — N2581 Secondary hyperparathyroidism of renal origin: Secondary | ICD-10-CM | POA: Diagnosis not present

## 2020-10-19 DIAGNOSIS — D509 Iron deficiency anemia, unspecified: Secondary | ICD-10-CM | POA: Diagnosis not present

## 2020-10-19 DIAGNOSIS — Z992 Dependence on renal dialysis: Secondary | ICD-10-CM | POA: Diagnosis not present

## 2020-10-19 DIAGNOSIS — N2589 Other disorders resulting from impaired renal tubular function: Secondary | ICD-10-CM | POA: Diagnosis not present

## 2020-10-19 DIAGNOSIS — N2581 Secondary hyperparathyroidism of renal origin: Secondary | ICD-10-CM | POA: Diagnosis not present

## 2020-10-19 DIAGNOSIS — N186 End stage renal disease: Secondary | ICD-10-CM | POA: Diagnosis not present

## 2020-10-19 DIAGNOSIS — D631 Anemia in chronic kidney disease: Secondary | ICD-10-CM | POA: Diagnosis not present

## 2020-10-19 DIAGNOSIS — E1122 Type 2 diabetes mellitus with diabetic chronic kidney disease: Secondary | ICD-10-CM | POA: Diagnosis not present

## 2020-10-20 DIAGNOSIS — N186 End stage renal disease: Secondary | ICD-10-CM | POA: Diagnosis not present

## 2020-10-20 DIAGNOSIS — N2589 Other disorders resulting from impaired renal tubular function: Secondary | ICD-10-CM | POA: Diagnosis not present

## 2020-10-20 DIAGNOSIS — D509 Iron deficiency anemia, unspecified: Secondary | ICD-10-CM | POA: Diagnosis not present

## 2020-10-20 DIAGNOSIS — D631 Anemia in chronic kidney disease: Secondary | ICD-10-CM | POA: Diagnosis not present

## 2020-10-20 DIAGNOSIS — E1122 Type 2 diabetes mellitus with diabetic chronic kidney disease: Secondary | ICD-10-CM | POA: Diagnosis not present

## 2020-10-20 DIAGNOSIS — N2581 Secondary hyperparathyroidism of renal origin: Secondary | ICD-10-CM | POA: Diagnosis not present

## 2020-10-20 DIAGNOSIS — Z992 Dependence on renal dialysis: Secondary | ICD-10-CM | POA: Diagnosis not present

## 2020-10-21 DIAGNOSIS — N186 End stage renal disease: Secondary | ICD-10-CM | POA: Diagnosis not present

## 2020-10-21 DIAGNOSIS — G4733 Obstructive sleep apnea (adult) (pediatric): Secondary | ICD-10-CM | POA: Diagnosis not present

## 2020-10-21 DIAGNOSIS — D509 Iron deficiency anemia, unspecified: Secondary | ICD-10-CM | POA: Diagnosis not present

## 2020-10-21 DIAGNOSIS — Z992 Dependence on renal dialysis: Secondary | ICD-10-CM | POA: Diagnosis not present

## 2020-10-21 DIAGNOSIS — E1122 Type 2 diabetes mellitus with diabetic chronic kidney disease: Secondary | ICD-10-CM | POA: Diagnosis not present

## 2020-10-21 DIAGNOSIS — N2581 Secondary hyperparathyroidism of renal origin: Secondary | ICD-10-CM | POA: Diagnosis not present

## 2020-10-21 DIAGNOSIS — N2589 Other disorders resulting from impaired renal tubular function: Secondary | ICD-10-CM | POA: Diagnosis not present

## 2020-10-21 DIAGNOSIS — D631 Anemia in chronic kidney disease: Secondary | ICD-10-CM | POA: Diagnosis not present

## 2020-10-22 DIAGNOSIS — E1122 Type 2 diabetes mellitus with diabetic chronic kidney disease: Secondary | ICD-10-CM | POA: Diagnosis not present

## 2020-10-22 DIAGNOSIS — N2589 Other disorders resulting from impaired renal tubular function: Secondary | ICD-10-CM | POA: Diagnosis not present

## 2020-10-22 DIAGNOSIS — N2581 Secondary hyperparathyroidism of renal origin: Secondary | ICD-10-CM | POA: Diagnosis not present

## 2020-10-22 DIAGNOSIS — D509 Iron deficiency anemia, unspecified: Secondary | ICD-10-CM | POA: Diagnosis not present

## 2020-10-22 DIAGNOSIS — D631 Anemia in chronic kidney disease: Secondary | ICD-10-CM | POA: Diagnosis not present

## 2020-10-22 DIAGNOSIS — Z992 Dependence on renal dialysis: Secondary | ICD-10-CM | POA: Diagnosis not present

## 2020-10-22 DIAGNOSIS — N186 End stage renal disease: Secondary | ICD-10-CM | POA: Diagnosis not present

## 2020-10-23 DIAGNOSIS — Z992 Dependence on renal dialysis: Secondary | ICD-10-CM | POA: Diagnosis not present

## 2020-10-23 DIAGNOSIS — D631 Anemia in chronic kidney disease: Secondary | ICD-10-CM | POA: Diagnosis not present

## 2020-10-23 DIAGNOSIS — D509 Iron deficiency anemia, unspecified: Secondary | ICD-10-CM | POA: Diagnosis not present

## 2020-10-23 DIAGNOSIS — N2589 Other disorders resulting from impaired renal tubular function: Secondary | ICD-10-CM | POA: Diagnosis not present

## 2020-10-23 DIAGNOSIS — E1122 Type 2 diabetes mellitus with diabetic chronic kidney disease: Secondary | ICD-10-CM | POA: Diagnosis not present

## 2020-10-23 DIAGNOSIS — N2581 Secondary hyperparathyroidism of renal origin: Secondary | ICD-10-CM | POA: Diagnosis not present

## 2020-10-23 DIAGNOSIS — N186 End stage renal disease: Secondary | ICD-10-CM | POA: Diagnosis not present

## 2020-10-24 DIAGNOSIS — E1122 Type 2 diabetes mellitus with diabetic chronic kidney disease: Secondary | ICD-10-CM | POA: Diagnosis not present

## 2020-10-24 DIAGNOSIS — N2581 Secondary hyperparathyroidism of renal origin: Secondary | ICD-10-CM | POA: Diagnosis not present

## 2020-10-24 DIAGNOSIS — D509 Iron deficiency anemia, unspecified: Secondary | ICD-10-CM | POA: Diagnosis not present

## 2020-10-24 DIAGNOSIS — D631 Anemia in chronic kidney disease: Secondary | ICD-10-CM | POA: Diagnosis not present

## 2020-10-24 DIAGNOSIS — N186 End stage renal disease: Secondary | ICD-10-CM | POA: Diagnosis not present

## 2020-10-24 DIAGNOSIS — N2589 Other disorders resulting from impaired renal tubular function: Secondary | ICD-10-CM | POA: Diagnosis not present

## 2020-10-24 DIAGNOSIS — Z992 Dependence on renal dialysis: Secondary | ICD-10-CM | POA: Diagnosis not present

## 2020-10-25 DIAGNOSIS — N2589 Other disorders resulting from impaired renal tubular function: Secondary | ICD-10-CM | POA: Diagnosis not present

## 2020-10-25 DIAGNOSIS — N2581 Secondary hyperparathyroidism of renal origin: Secondary | ICD-10-CM | POA: Diagnosis not present

## 2020-10-25 DIAGNOSIS — D631 Anemia in chronic kidney disease: Secondary | ICD-10-CM | POA: Diagnosis not present

## 2020-10-25 DIAGNOSIS — E1122 Type 2 diabetes mellitus with diabetic chronic kidney disease: Secondary | ICD-10-CM | POA: Diagnosis not present

## 2020-10-25 DIAGNOSIS — Z992 Dependence on renal dialysis: Secondary | ICD-10-CM | POA: Diagnosis not present

## 2020-10-25 DIAGNOSIS — N186 End stage renal disease: Secondary | ICD-10-CM | POA: Diagnosis not present

## 2020-10-25 DIAGNOSIS — D509 Iron deficiency anemia, unspecified: Secondary | ICD-10-CM | POA: Diagnosis not present

## 2020-10-26 DIAGNOSIS — D509 Iron deficiency anemia, unspecified: Secondary | ICD-10-CM | POA: Diagnosis not present

## 2020-10-26 DIAGNOSIS — N2581 Secondary hyperparathyroidism of renal origin: Secondary | ICD-10-CM | POA: Diagnosis not present

## 2020-10-26 DIAGNOSIS — N186 End stage renal disease: Secondary | ICD-10-CM | POA: Diagnosis not present

## 2020-10-26 DIAGNOSIS — N2589 Other disorders resulting from impaired renal tubular function: Secondary | ICD-10-CM | POA: Diagnosis not present

## 2020-10-26 DIAGNOSIS — E1122 Type 2 diabetes mellitus with diabetic chronic kidney disease: Secondary | ICD-10-CM | POA: Diagnosis not present

## 2020-10-26 DIAGNOSIS — D631 Anemia in chronic kidney disease: Secondary | ICD-10-CM | POA: Diagnosis not present

## 2020-10-26 DIAGNOSIS — Z992 Dependence on renal dialysis: Secondary | ICD-10-CM | POA: Diagnosis not present

## 2020-10-27 DIAGNOSIS — D631 Anemia in chronic kidney disease: Secondary | ICD-10-CM | POA: Diagnosis not present

## 2020-10-27 DIAGNOSIS — Z992 Dependence on renal dialysis: Secondary | ICD-10-CM | POA: Diagnosis not present

## 2020-10-27 DIAGNOSIS — N2589 Other disorders resulting from impaired renal tubular function: Secondary | ICD-10-CM | POA: Diagnosis not present

## 2020-10-27 DIAGNOSIS — E1122 Type 2 diabetes mellitus with diabetic chronic kidney disease: Secondary | ICD-10-CM | POA: Diagnosis not present

## 2020-10-27 DIAGNOSIS — N2581 Secondary hyperparathyroidism of renal origin: Secondary | ICD-10-CM | POA: Diagnosis not present

## 2020-10-27 DIAGNOSIS — N186 End stage renal disease: Secondary | ICD-10-CM | POA: Diagnosis not present

## 2020-10-27 DIAGNOSIS — D509 Iron deficiency anemia, unspecified: Secondary | ICD-10-CM | POA: Diagnosis not present

## 2020-10-28 DIAGNOSIS — N186 End stage renal disease: Secondary | ICD-10-CM | POA: Diagnosis not present

## 2020-10-28 DIAGNOSIS — E1122 Type 2 diabetes mellitus with diabetic chronic kidney disease: Secondary | ICD-10-CM | POA: Diagnosis not present

## 2020-10-28 DIAGNOSIS — D509 Iron deficiency anemia, unspecified: Secondary | ICD-10-CM | POA: Diagnosis not present

## 2020-10-28 DIAGNOSIS — Z992 Dependence on renal dialysis: Secondary | ICD-10-CM | POA: Diagnosis not present

## 2020-10-28 DIAGNOSIS — N2581 Secondary hyperparathyroidism of renal origin: Secondary | ICD-10-CM | POA: Diagnosis not present

## 2020-10-28 DIAGNOSIS — L97511 Non-pressure chronic ulcer of other part of right foot limited to breakdown of skin: Secondary | ICD-10-CM | POA: Diagnosis not present

## 2020-10-28 DIAGNOSIS — N2589 Other disorders resulting from impaired renal tubular function: Secondary | ICD-10-CM | POA: Diagnosis not present

## 2020-10-28 DIAGNOSIS — Z794 Long term (current) use of insulin: Secondary | ICD-10-CM | POA: Diagnosis not present

## 2020-10-28 DIAGNOSIS — E11621 Type 2 diabetes mellitus with foot ulcer: Secondary | ICD-10-CM | POA: Diagnosis not present

## 2020-10-28 DIAGNOSIS — D631 Anemia in chronic kidney disease: Secondary | ICD-10-CM | POA: Diagnosis not present

## 2020-10-28 DIAGNOSIS — L97522 Non-pressure chronic ulcer of other part of left foot with fat layer exposed: Secondary | ICD-10-CM | POA: Diagnosis not present

## 2020-10-28 DIAGNOSIS — Z89422 Acquired absence of other left toe(s): Secondary | ICD-10-CM | POA: Diagnosis not present

## 2020-10-29 DIAGNOSIS — N2589 Other disorders resulting from impaired renal tubular function: Secondary | ICD-10-CM | POA: Diagnosis not present

## 2020-10-29 DIAGNOSIS — N186 End stage renal disease: Secondary | ICD-10-CM | POA: Diagnosis not present

## 2020-10-29 DIAGNOSIS — N2581 Secondary hyperparathyroidism of renal origin: Secondary | ICD-10-CM | POA: Diagnosis not present

## 2020-10-29 DIAGNOSIS — D509 Iron deficiency anemia, unspecified: Secondary | ICD-10-CM | POA: Diagnosis not present

## 2020-10-29 DIAGNOSIS — D631 Anemia in chronic kidney disease: Secondary | ICD-10-CM | POA: Diagnosis not present

## 2020-10-29 DIAGNOSIS — E1122 Type 2 diabetes mellitus with diabetic chronic kidney disease: Secondary | ICD-10-CM | POA: Diagnosis not present

## 2020-10-29 DIAGNOSIS — Z992 Dependence on renal dialysis: Secondary | ICD-10-CM | POA: Diagnosis not present

## 2020-10-30 DIAGNOSIS — D509 Iron deficiency anemia, unspecified: Secondary | ICD-10-CM | POA: Diagnosis not present

## 2020-10-30 DIAGNOSIS — D631 Anemia in chronic kidney disease: Secondary | ICD-10-CM | POA: Diagnosis not present

## 2020-10-30 DIAGNOSIS — E1122 Type 2 diabetes mellitus with diabetic chronic kidney disease: Secondary | ICD-10-CM | POA: Diagnosis not present

## 2020-10-30 DIAGNOSIS — Z992 Dependence on renal dialysis: Secondary | ICD-10-CM | POA: Diagnosis not present

## 2020-10-30 DIAGNOSIS — N2589 Other disorders resulting from impaired renal tubular function: Secondary | ICD-10-CM | POA: Diagnosis not present

## 2020-10-30 DIAGNOSIS — N2581 Secondary hyperparathyroidism of renal origin: Secondary | ICD-10-CM | POA: Diagnosis not present

## 2020-10-30 DIAGNOSIS — N186 End stage renal disease: Secondary | ICD-10-CM | POA: Diagnosis not present

## 2020-10-31 DIAGNOSIS — D509 Iron deficiency anemia, unspecified: Secondary | ICD-10-CM | POA: Diagnosis not present

## 2020-10-31 DIAGNOSIS — N186 End stage renal disease: Secondary | ICD-10-CM | POA: Diagnosis not present

## 2020-10-31 DIAGNOSIS — D631 Anemia in chronic kidney disease: Secondary | ICD-10-CM | POA: Diagnosis not present

## 2020-10-31 DIAGNOSIS — N2581 Secondary hyperparathyroidism of renal origin: Secondary | ICD-10-CM | POA: Diagnosis not present

## 2020-10-31 DIAGNOSIS — N2589 Other disorders resulting from impaired renal tubular function: Secondary | ICD-10-CM | POA: Diagnosis not present

## 2020-10-31 DIAGNOSIS — Z992 Dependence on renal dialysis: Secondary | ICD-10-CM | POA: Diagnosis not present

## 2020-10-31 DIAGNOSIS — E1122 Type 2 diabetes mellitus with diabetic chronic kidney disease: Secondary | ICD-10-CM | POA: Diagnosis not present

## 2020-11-01 DIAGNOSIS — D631 Anemia in chronic kidney disease: Secondary | ICD-10-CM | POA: Diagnosis not present

## 2020-11-01 DIAGNOSIS — N186 End stage renal disease: Secondary | ICD-10-CM | POA: Diagnosis not present

## 2020-11-01 DIAGNOSIS — Z992 Dependence on renal dialysis: Secondary | ICD-10-CM | POA: Diagnosis not present

## 2020-11-01 DIAGNOSIS — N2581 Secondary hyperparathyroidism of renal origin: Secondary | ICD-10-CM | POA: Diagnosis not present

## 2020-11-01 DIAGNOSIS — E1122 Type 2 diabetes mellitus with diabetic chronic kidney disease: Secondary | ICD-10-CM | POA: Diagnosis not present

## 2020-11-01 DIAGNOSIS — N2589 Other disorders resulting from impaired renal tubular function: Secondary | ICD-10-CM | POA: Diagnosis not present

## 2020-11-01 DIAGNOSIS — D509 Iron deficiency anemia, unspecified: Secondary | ICD-10-CM | POA: Diagnosis not present

## 2020-11-02 DIAGNOSIS — Z992 Dependence on renal dialysis: Secondary | ICD-10-CM | POA: Diagnosis not present

## 2020-11-02 DIAGNOSIS — N2589 Other disorders resulting from impaired renal tubular function: Secondary | ICD-10-CM | POA: Diagnosis not present

## 2020-11-02 DIAGNOSIS — E1122 Type 2 diabetes mellitus with diabetic chronic kidney disease: Secondary | ICD-10-CM | POA: Diagnosis not present

## 2020-11-02 DIAGNOSIS — D509 Iron deficiency anemia, unspecified: Secondary | ICD-10-CM | POA: Diagnosis not present

## 2020-11-02 DIAGNOSIS — N186 End stage renal disease: Secondary | ICD-10-CM | POA: Diagnosis not present

## 2020-11-02 DIAGNOSIS — N2581 Secondary hyperparathyroidism of renal origin: Secondary | ICD-10-CM | POA: Diagnosis not present

## 2020-11-02 DIAGNOSIS — D631 Anemia in chronic kidney disease: Secondary | ICD-10-CM | POA: Diagnosis not present

## 2020-11-03 DIAGNOSIS — D631 Anemia in chronic kidney disease: Secondary | ICD-10-CM | POA: Diagnosis not present

## 2020-11-03 DIAGNOSIS — Z992 Dependence on renal dialysis: Secondary | ICD-10-CM | POA: Diagnosis not present

## 2020-11-03 DIAGNOSIS — N2589 Other disorders resulting from impaired renal tubular function: Secondary | ICD-10-CM | POA: Diagnosis not present

## 2020-11-03 DIAGNOSIS — N2581 Secondary hyperparathyroidism of renal origin: Secondary | ICD-10-CM | POA: Diagnosis not present

## 2020-11-03 DIAGNOSIS — D509 Iron deficiency anemia, unspecified: Secondary | ICD-10-CM | POA: Diagnosis not present

## 2020-11-03 DIAGNOSIS — N186 End stage renal disease: Secondary | ICD-10-CM | POA: Diagnosis not present

## 2020-11-03 DIAGNOSIS — E1122 Type 2 diabetes mellitus with diabetic chronic kidney disease: Secondary | ICD-10-CM | POA: Diagnosis not present

## 2020-11-04 DIAGNOSIS — N2581 Secondary hyperparathyroidism of renal origin: Secondary | ICD-10-CM | POA: Diagnosis not present

## 2020-11-04 DIAGNOSIS — Z992 Dependence on renal dialysis: Secondary | ICD-10-CM | POA: Diagnosis not present

## 2020-11-04 DIAGNOSIS — D509 Iron deficiency anemia, unspecified: Secondary | ICD-10-CM | POA: Diagnosis not present

## 2020-11-04 DIAGNOSIS — N2589 Other disorders resulting from impaired renal tubular function: Secondary | ICD-10-CM | POA: Diagnosis not present

## 2020-11-04 DIAGNOSIS — N186 End stage renal disease: Secondary | ICD-10-CM | POA: Diagnosis not present

## 2020-11-04 DIAGNOSIS — E1122 Type 2 diabetes mellitus with diabetic chronic kidney disease: Secondary | ICD-10-CM | POA: Diagnosis not present

## 2020-11-04 DIAGNOSIS — D631 Anemia in chronic kidney disease: Secondary | ICD-10-CM | POA: Diagnosis not present

## 2020-11-05 DIAGNOSIS — N2589 Other disorders resulting from impaired renal tubular function: Secondary | ICD-10-CM | POA: Diagnosis not present

## 2020-11-05 DIAGNOSIS — Z992 Dependence on renal dialysis: Secondary | ICD-10-CM | POA: Diagnosis not present

## 2020-11-05 DIAGNOSIS — D631 Anemia in chronic kidney disease: Secondary | ICD-10-CM | POA: Diagnosis not present

## 2020-11-05 DIAGNOSIS — N186 End stage renal disease: Secondary | ICD-10-CM | POA: Diagnosis not present

## 2020-11-05 DIAGNOSIS — E1122 Type 2 diabetes mellitus with diabetic chronic kidney disease: Secondary | ICD-10-CM | POA: Diagnosis not present

## 2020-11-05 DIAGNOSIS — N2581 Secondary hyperparathyroidism of renal origin: Secondary | ICD-10-CM | POA: Diagnosis not present

## 2020-11-05 DIAGNOSIS — D509 Iron deficiency anemia, unspecified: Secondary | ICD-10-CM | POA: Diagnosis not present

## 2020-11-06 DIAGNOSIS — D631 Anemia in chronic kidney disease: Secondary | ICD-10-CM | POA: Diagnosis not present

## 2020-11-06 DIAGNOSIS — E1122 Type 2 diabetes mellitus with diabetic chronic kidney disease: Secondary | ICD-10-CM | POA: Diagnosis not present

## 2020-11-06 DIAGNOSIS — N2581 Secondary hyperparathyroidism of renal origin: Secondary | ICD-10-CM | POA: Diagnosis not present

## 2020-11-06 DIAGNOSIS — N2589 Other disorders resulting from impaired renal tubular function: Secondary | ICD-10-CM | POA: Diagnosis not present

## 2020-11-06 DIAGNOSIS — Z992 Dependence on renal dialysis: Secondary | ICD-10-CM | POA: Diagnosis not present

## 2020-11-06 DIAGNOSIS — N186 End stage renal disease: Secondary | ICD-10-CM | POA: Diagnosis not present

## 2020-11-06 DIAGNOSIS — D509 Iron deficiency anemia, unspecified: Secondary | ICD-10-CM | POA: Diagnosis not present

## 2020-11-07 DIAGNOSIS — N2589 Other disorders resulting from impaired renal tubular function: Secondary | ICD-10-CM | POA: Diagnosis not present

## 2020-11-07 DIAGNOSIS — N186 End stage renal disease: Secondary | ICD-10-CM | POA: Diagnosis not present

## 2020-11-07 DIAGNOSIS — D631 Anemia in chronic kidney disease: Secondary | ICD-10-CM | POA: Diagnosis not present

## 2020-11-07 DIAGNOSIS — Z992 Dependence on renal dialysis: Secondary | ICD-10-CM | POA: Diagnosis not present

## 2020-11-07 DIAGNOSIS — D509 Iron deficiency anemia, unspecified: Secondary | ICD-10-CM | POA: Diagnosis not present

## 2020-11-07 DIAGNOSIS — E1122 Type 2 diabetes mellitus with diabetic chronic kidney disease: Secondary | ICD-10-CM | POA: Diagnosis not present

## 2020-11-07 DIAGNOSIS — N2581 Secondary hyperparathyroidism of renal origin: Secondary | ICD-10-CM | POA: Diagnosis not present

## 2020-11-08 DIAGNOSIS — N2581 Secondary hyperparathyroidism of renal origin: Secondary | ICD-10-CM | POA: Diagnosis not present

## 2020-11-08 DIAGNOSIS — Z79899 Other long term (current) drug therapy: Secondary | ICD-10-CM | POA: Diagnosis not present

## 2020-11-08 DIAGNOSIS — R17 Unspecified jaundice: Secondary | ICD-10-CM | POA: Diagnosis not present

## 2020-11-08 DIAGNOSIS — D509 Iron deficiency anemia, unspecified: Secondary | ICD-10-CM | POA: Diagnosis not present

## 2020-11-08 DIAGNOSIS — Z992 Dependence on renal dialysis: Secondary | ICD-10-CM | POA: Diagnosis not present

## 2020-11-08 DIAGNOSIS — D631 Anemia in chronic kidney disease: Secondary | ICD-10-CM | POA: Diagnosis not present

## 2020-11-08 DIAGNOSIS — E440 Moderate protein-calorie malnutrition: Secondary | ICD-10-CM | POA: Diagnosis not present

## 2020-11-08 DIAGNOSIS — N186 End stage renal disease: Secondary | ICD-10-CM | POA: Diagnosis not present

## 2020-11-08 DIAGNOSIS — N2589 Other disorders resulting from impaired renal tubular function: Secondary | ICD-10-CM | POA: Diagnosis not present

## 2020-11-09 DIAGNOSIS — N186 End stage renal disease: Secondary | ICD-10-CM | POA: Diagnosis not present

## 2020-11-09 DIAGNOSIS — N2589 Other disorders resulting from impaired renal tubular function: Secondary | ICD-10-CM | POA: Diagnosis not present

## 2020-11-09 DIAGNOSIS — E440 Moderate protein-calorie malnutrition: Secondary | ICD-10-CM | POA: Diagnosis not present

## 2020-11-09 DIAGNOSIS — R17 Unspecified jaundice: Secondary | ICD-10-CM | POA: Diagnosis not present

## 2020-11-09 DIAGNOSIS — N2581 Secondary hyperparathyroidism of renal origin: Secondary | ICD-10-CM | POA: Diagnosis not present

## 2020-11-09 DIAGNOSIS — Z992 Dependence on renal dialysis: Secondary | ICD-10-CM | POA: Diagnosis not present

## 2020-11-09 DIAGNOSIS — D509 Iron deficiency anemia, unspecified: Secondary | ICD-10-CM | POA: Diagnosis not present

## 2020-11-09 DIAGNOSIS — D631 Anemia in chronic kidney disease: Secondary | ICD-10-CM | POA: Diagnosis not present

## 2020-11-09 DIAGNOSIS — Z79899 Other long term (current) drug therapy: Secondary | ICD-10-CM | POA: Diagnosis not present

## 2020-11-10 DIAGNOSIS — Z79899 Other long term (current) drug therapy: Secondary | ICD-10-CM | POA: Diagnosis not present

## 2020-11-10 DIAGNOSIS — Z992 Dependence on renal dialysis: Secondary | ICD-10-CM | POA: Diagnosis not present

## 2020-11-10 DIAGNOSIS — N2581 Secondary hyperparathyroidism of renal origin: Secondary | ICD-10-CM | POA: Diagnosis not present

## 2020-11-10 DIAGNOSIS — N2589 Other disorders resulting from impaired renal tubular function: Secondary | ICD-10-CM | POA: Diagnosis not present

## 2020-11-10 DIAGNOSIS — E440 Moderate protein-calorie malnutrition: Secondary | ICD-10-CM | POA: Diagnosis not present

## 2020-11-10 DIAGNOSIS — D509 Iron deficiency anemia, unspecified: Secondary | ICD-10-CM | POA: Diagnosis not present

## 2020-11-10 DIAGNOSIS — R17 Unspecified jaundice: Secondary | ICD-10-CM | POA: Diagnosis not present

## 2020-11-10 DIAGNOSIS — D631 Anemia in chronic kidney disease: Secondary | ICD-10-CM | POA: Diagnosis not present

## 2020-11-10 DIAGNOSIS — N186 End stage renal disease: Secondary | ICD-10-CM | POA: Diagnosis not present

## 2020-11-11 DIAGNOSIS — G4733 Obstructive sleep apnea (adult) (pediatric): Secondary | ICD-10-CM | POA: Diagnosis not present

## 2020-11-11 DIAGNOSIS — N186 End stage renal disease: Secondary | ICD-10-CM | POA: Diagnosis not present

## 2020-11-11 DIAGNOSIS — E440 Moderate protein-calorie malnutrition: Secondary | ICD-10-CM | POA: Diagnosis not present

## 2020-11-11 DIAGNOSIS — D509 Iron deficiency anemia, unspecified: Secondary | ICD-10-CM | POA: Diagnosis not present

## 2020-11-11 DIAGNOSIS — N2581 Secondary hyperparathyroidism of renal origin: Secondary | ICD-10-CM | POA: Diagnosis not present

## 2020-11-11 DIAGNOSIS — D631 Anemia in chronic kidney disease: Secondary | ICD-10-CM | POA: Diagnosis not present

## 2020-11-11 DIAGNOSIS — Z992 Dependence on renal dialysis: Secondary | ICD-10-CM | POA: Diagnosis not present

## 2020-11-11 DIAGNOSIS — N2589 Other disorders resulting from impaired renal tubular function: Secondary | ICD-10-CM | POA: Diagnosis not present

## 2020-11-11 DIAGNOSIS — Z79899 Other long term (current) drug therapy: Secondary | ICD-10-CM | POA: Diagnosis not present

## 2020-11-11 DIAGNOSIS — R17 Unspecified jaundice: Secondary | ICD-10-CM | POA: Diagnosis not present

## 2020-11-12 DIAGNOSIS — Z992 Dependence on renal dialysis: Secondary | ICD-10-CM | POA: Diagnosis not present

## 2020-11-12 DIAGNOSIS — D509 Iron deficiency anemia, unspecified: Secondary | ICD-10-CM | POA: Diagnosis not present

## 2020-11-12 DIAGNOSIS — E440 Moderate protein-calorie malnutrition: Secondary | ICD-10-CM | POA: Diagnosis not present

## 2020-11-12 DIAGNOSIS — R17 Unspecified jaundice: Secondary | ICD-10-CM | POA: Diagnosis not present

## 2020-11-12 DIAGNOSIS — Z79899 Other long term (current) drug therapy: Secondary | ICD-10-CM | POA: Diagnosis not present

## 2020-11-12 DIAGNOSIS — N2581 Secondary hyperparathyroidism of renal origin: Secondary | ICD-10-CM | POA: Diagnosis not present

## 2020-11-12 DIAGNOSIS — D631 Anemia in chronic kidney disease: Secondary | ICD-10-CM | POA: Diagnosis not present

## 2020-11-12 DIAGNOSIS — N2589 Other disorders resulting from impaired renal tubular function: Secondary | ICD-10-CM | POA: Diagnosis not present

## 2020-11-12 DIAGNOSIS — N186 End stage renal disease: Secondary | ICD-10-CM | POA: Diagnosis not present

## 2020-11-13 DIAGNOSIS — N2581 Secondary hyperparathyroidism of renal origin: Secondary | ICD-10-CM | POA: Diagnosis not present

## 2020-11-13 DIAGNOSIS — Z992 Dependence on renal dialysis: Secondary | ICD-10-CM | POA: Diagnosis not present

## 2020-11-13 DIAGNOSIS — D631 Anemia in chronic kidney disease: Secondary | ICD-10-CM | POA: Diagnosis not present

## 2020-11-13 DIAGNOSIS — N186 End stage renal disease: Secondary | ICD-10-CM | POA: Diagnosis not present

## 2020-11-13 DIAGNOSIS — R17 Unspecified jaundice: Secondary | ICD-10-CM | POA: Diagnosis not present

## 2020-11-13 DIAGNOSIS — D509 Iron deficiency anemia, unspecified: Secondary | ICD-10-CM | POA: Diagnosis not present

## 2020-11-13 DIAGNOSIS — Z79899 Other long term (current) drug therapy: Secondary | ICD-10-CM | POA: Diagnosis not present

## 2020-11-13 DIAGNOSIS — E440 Moderate protein-calorie malnutrition: Secondary | ICD-10-CM | POA: Diagnosis not present

## 2020-11-13 DIAGNOSIS — N2589 Other disorders resulting from impaired renal tubular function: Secondary | ICD-10-CM | POA: Diagnosis not present

## 2020-11-14 DIAGNOSIS — Z992 Dependence on renal dialysis: Secondary | ICD-10-CM | POA: Diagnosis not present

## 2020-11-14 DIAGNOSIS — R17 Unspecified jaundice: Secondary | ICD-10-CM | POA: Diagnosis not present

## 2020-11-14 DIAGNOSIS — Z79899 Other long term (current) drug therapy: Secondary | ICD-10-CM | POA: Diagnosis not present

## 2020-11-14 DIAGNOSIS — D509 Iron deficiency anemia, unspecified: Secondary | ICD-10-CM | POA: Diagnosis not present

## 2020-11-14 DIAGNOSIS — N2589 Other disorders resulting from impaired renal tubular function: Secondary | ICD-10-CM | POA: Diagnosis not present

## 2020-11-14 DIAGNOSIS — E440 Moderate protein-calorie malnutrition: Secondary | ICD-10-CM | POA: Diagnosis not present

## 2020-11-14 DIAGNOSIS — D631 Anemia in chronic kidney disease: Secondary | ICD-10-CM | POA: Diagnosis not present

## 2020-11-14 DIAGNOSIS — N186 End stage renal disease: Secondary | ICD-10-CM | POA: Diagnosis not present

## 2020-11-14 DIAGNOSIS — N2581 Secondary hyperparathyroidism of renal origin: Secondary | ICD-10-CM | POA: Diagnosis not present

## 2020-11-15 DIAGNOSIS — N186 End stage renal disease: Secondary | ICD-10-CM | POA: Diagnosis not present

## 2020-11-15 DIAGNOSIS — R17 Unspecified jaundice: Secondary | ICD-10-CM | POA: Diagnosis not present

## 2020-11-15 DIAGNOSIS — Z992 Dependence on renal dialysis: Secondary | ICD-10-CM | POA: Diagnosis not present

## 2020-11-15 DIAGNOSIS — N2589 Other disorders resulting from impaired renal tubular function: Secondary | ICD-10-CM | POA: Diagnosis not present

## 2020-11-15 DIAGNOSIS — N2581 Secondary hyperparathyroidism of renal origin: Secondary | ICD-10-CM | POA: Diagnosis not present

## 2020-11-15 DIAGNOSIS — Z79899 Other long term (current) drug therapy: Secondary | ICD-10-CM | POA: Diagnosis not present

## 2020-11-15 DIAGNOSIS — E440 Moderate protein-calorie malnutrition: Secondary | ICD-10-CM | POA: Diagnosis not present

## 2020-11-15 DIAGNOSIS — D631 Anemia in chronic kidney disease: Secondary | ICD-10-CM | POA: Diagnosis not present

## 2020-11-15 DIAGNOSIS — D509 Iron deficiency anemia, unspecified: Secondary | ICD-10-CM | POA: Diagnosis not present

## 2020-11-16 DIAGNOSIS — E440 Moderate protein-calorie malnutrition: Secondary | ICD-10-CM | POA: Diagnosis not present

## 2020-11-16 DIAGNOSIS — D631 Anemia in chronic kidney disease: Secondary | ICD-10-CM | POA: Diagnosis not present

## 2020-11-16 DIAGNOSIS — Z79899 Other long term (current) drug therapy: Secondary | ICD-10-CM | POA: Diagnosis not present

## 2020-11-16 DIAGNOSIS — R17 Unspecified jaundice: Secondary | ICD-10-CM | POA: Diagnosis not present

## 2020-11-16 DIAGNOSIS — N2589 Other disorders resulting from impaired renal tubular function: Secondary | ICD-10-CM | POA: Diagnosis not present

## 2020-11-16 DIAGNOSIS — N186 End stage renal disease: Secondary | ICD-10-CM | POA: Diagnosis not present

## 2020-11-16 DIAGNOSIS — D509 Iron deficiency anemia, unspecified: Secondary | ICD-10-CM | POA: Diagnosis not present

## 2020-11-16 DIAGNOSIS — N2581 Secondary hyperparathyroidism of renal origin: Secondary | ICD-10-CM | POA: Diagnosis not present

## 2020-11-16 DIAGNOSIS — Z992 Dependence on renal dialysis: Secondary | ICD-10-CM | POA: Diagnosis not present

## 2020-11-17 DIAGNOSIS — D509 Iron deficiency anemia, unspecified: Secondary | ICD-10-CM | POA: Diagnosis not present

## 2020-11-17 DIAGNOSIS — Z79899 Other long term (current) drug therapy: Secondary | ICD-10-CM | POA: Diagnosis not present

## 2020-11-17 DIAGNOSIS — Z992 Dependence on renal dialysis: Secondary | ICD-10-CM | POA: Diagnosis not present

## 2020-11-17 DIAGNOSIS — R17 Unspecified jaundice: Secondary | ICD-10-CM | POA: Diagnosis not present

## 2020-11-17 DIAGNOSIS — N2581 Secondary hyperparathyroidism of renal origin: Secondary | ICD-10-CM | POA: Diagnosis not present

## 2020-11-17 DIAGNOSIS — E440 Moderate protein-calorie malnutrition: Secondary | ICD-10-CM | POA: Diagnosis not present

## 2020-11-17 DIAGNOSIS — N2589 Other disorders resulting from impaired renal tubular function: Secondary | ICD-10-CM | POA: Diagnosis not present

## 2020-11-17 DIAGNOSIS — N186 End stage renal disease: Secondary | ICD-10-CM | POA: Diagnosis not present

## 2020-11-17 DIAGNOSIS — D631 Anemia in chronic kidney disease: Secondary | ICD-10-CM | POA: Diagnosis not present

## 2020-11-18 DIAGNOSIS — E11621 Type 2 diabetes mellitus with foot ulcer: Secondary | ICD-10-CM | POA: Diagnosis not present

## 2020-11-18 DIAGNOSIS — N186 End stage renal disease: Secondary | ICD-10-CM | POA: Diagnosis not present

## 2020-11-18 DIAGNOSIS — L97509 Non-pressure chronic ulcer of other part of unspecified foot with unspecified severity: Secondary | ICD-10-CM | POA: Diagnosis not present

## 2020-11-18 DIAGNOSIS — L97522 Non-pressure chronic ulcer of other part of left foot with fat layer exposed: Secondary | ICD-10-CM | POA: Diagnosis not present

## 2020-11-18 DIAGNOSIS — D631 Anemia in chronic kidney disease: Secondary | ICD-10-CM | POA: Diagnosis not present

## 2020-11-18 DIAGNOSIS — L97511 Non-pressure chronic ulcer of other part of right foot limited to breakdown of skin: Secondary | ICD-10-CM | POA: Diagnosis not present

## 2020-11-18 DIAGNOSIS — Z79899 Other long term (current) drug therapy: Secondary | ICD-10-CM | POA: Diagnosis not present

## 2020-11-18 DIAGNOSIS — D509 Iron deficiency anemia, unspecified: Secondary | ICD-10-CM | POA: Diagnosis not present

## 2020-11-18 DIAGNOSIS — E440 Moderate protein-calorie malnutrition: Secondary | ICD-10-CM | POA: Diagnosis not present

## 2020-11-18 DIAGNOSIS — Z794 Long term (current) use of insulin: Secondary | ICD-10-CM | POA: Diagnosis not present

## 2020-11-18 DIAGNOSIS — N2581 Secondary hyperparathyroidism of renal origin: Secondary | ICD-10-CM | POA: Diagnosis not present

## 2020-11-18 DIAGNOSIS — N2589 Other disorders resulting from impaired renal tubular function: Secondary | ICD-10-CM | POA: Diagnosis not present

## 2020-11-18 DIAGNOSIS — R17 Unspecified jaundice: Secondary | ICD-10-CM | POA: Diagnosis not present

## 2020-11-18 DIAGNOSIS — Z992 Dependence on renal dialysis: Secondary | ICD-10-CM | POA: Diagnosis not present

## 2020-11-19 DIAGNOSIS — N2589 Other disorders resulting from impaired renal tubular function: Secondary | ICD-10-CM | POA: Diagnosis not present

## 2020-11-19 DIAGNOSIS — D631 Anemia in chronic kidney disease: Secondary | ICD-10-CM | POA: Diagnosis not present

## 2020-11-19 DIAGNOSIS — D509 Iron deficiency anemia, unspecified: Secondary | ICD-10-CM | POA: Diagnosis not present

## 2020-11-19 DIAGNOSIS — Z992 Dependence on renal dialysis: Secondary | ICD-10-CM | POA: Diagnosis not present

## 2020-11-19 DIAGNOSIS — Z79899 Other long term (current) drug therapy: Secondary | ICD-10-CM | POA: Diagnosis not present

## 2020-11-19 DIAGNOSIS — N186 End stage renal disease: Secondary | ICD-10-CM | POA: Diagnosis not present

## 2020-11-19 DIAGNOSIS — R17 Unspecified jaundice: Secondary | ICD-10-CM | POA: Diagnosis not present

## 2020-11-19 DIAGNOSIS — E440 Moderate protein-calorie malnutrition: Secondary | ICD-10-CM | POA: Diagnosis not present

## 2020-11-19 DIAGNOSIS — N2581 Secondary hyperparathyroidism of renal origin: Secondary | ICD-10-CM | POA: Diagnosis not present

## 2020-11-20 DIAGNOSIS — Z992 Dependence on renal dialysis: Secondary | ICD-10-CM | POA: Diagnosis not present

## 2020-11-20 DIAGNOSIS — E440 Moderate protein-calorie malnutrition: Secondary | ICD-10-CM | POA: Diagnosis not present

## 2020-11-20 DIAGNOSIS — N2589 Other disorders resulting from impaired renal tubular function: Secondary | ICD-10-CM | POA: Diagnosis not present

## 2020-11-20 DIAGNOSIS — Z79899 Other long term (current) drug therapy: Secondary | ICD-10-CM | POA: Diagnosis not present

## 2020-11-20 DIAGNOSIS — D509 Iron deficiency anemia, unspecified: Secondary | ICD-10-CM | POA: Diagnosis not present

## 2020-11-20 DIAGNOSIS — D631 Anemia in chronic kidney disease: Secondary | ICD-10-CM | POA: Diagnosis not present

## 2020-11-20 DIAGNOSIS — R17 Unspecified jaundice: Secondary | ICD-10-CM | POA: Diagnosis not present

## 2020-11-20 DIAGNOSIS — N186 End stage renal disease: Secondary | ICD-10-CM | POA: Diagnosis not present

## 2020-11-20 DIAGNOSIS — N2581 Secondary hyperparathyroidism of renal origin: Secondary | ICD-10-CM | POA: Diagnosis not present

## 2020-11-21 DIAGNOSIS — E440 Moderate protein-calorie malnutrition: Secondary | ICD-10-CM | POA: Diagnosis not present

## 2020-11-21 DIAGNOSIS — G4733 Obstructive sleep apnea (adult) (pediatric): Secondary | ICD-10-CM | POA: Diagnosis not present

## 2020-11-21 DIAGNOSIS — R17 Unspecified jaundice: Secondary | ICD-10-CM | POA: Diagnosis not present

## 2020-11-21 DIAGNOSIS — Z79899 Other long term (current) drug therapy: Secondary | ICD-10-CM | POA: Diagnosis not present

## 2020-11-21 DIAGNOSIS — N2589 Other disorders resulting from impaired renal tubular function: Secondary | ICD-10-CM | POA: Diagnosis not present

## 2020-11-21 DIAGNOSIS — Z992 Dependence on renal dialysis: Secondary | ICD-10-CM | POA: Diagnosis not present

## 2020-11-21 DIAGNOSIS — N186 End stage renal disease: Secondary | ICD-10-CM | POA: Diagnosis not present

## 2020-11-21 DIAGNOSIS — N2581 Secondary hyperparathyroidism of renal origin: Secondary | ICD-10-CM | POA: Diagnosis not present

## 2020-11-21 DIAGNOSIS — D631 Anemia in chronic kidney disease: Secondary | ICD-10-CM | POA: Diagnosis not present

## 2020-11-21 DIAGNOSIS — D509 Iron deficiency anemia, unspecified: Secondary | ICD-10-CM | POA: Diagnosis not present

## 2020-11-22 DIAGNOSIS — N186 End stage renal disease: Secondary | ICD-10-CM | POA: Diagnosis not present

## 2020-11-22 DIAGNOSIS — N2581 Secondary hyperparathyroidism of renal origin: Secondary | ICD-10-CM | POA: Diagnosis not present

## 2020-11-22 DIAGNOSIS — Z992 Dependence on renal dialysis: Secondary | ICD-10-CM | POA: Diagnosis not present

## 2020-11-22 DIAGNOSIS — D509 Iron deficiency anemia, unspecified: Secondary | ICD-10-CM | POA: Diagnosis not present

## 2020-11-22 DIAGNOSIS — Z79899 Other long term (current) drug therapy: Secondary | ICD-10-CM | POA: Diagnosis not present

## 2020-11-22 DIAGNOSIS — E440 Moderate protein-calorie malnutrition: Secondary | ICD-10-CM | POA: Diagnosis not present

## 2020-11-22 DIAGNOSIS — D631 Anemia in chronic kidney disease: Secondary | ICD-10-CM | POA: Diagnosis not present

## 2020-11-22 DIAGNOSIS — N2589 Other disorders resulting from impaired renal tubular function: Secondary | ICD-10-CM | POA: Diagnosis not present

## 2020-11-22 DIAGNOSIS — R17 Unspecified jaundice: Secondary | ICD-10-CM | POA: Diagnosis not present

## 2020-11-23 DIAGNOSIS — E440 Moderate protein-calorie malnutrition: Secondary | ICD-10-CM | POA: Diagnosis not present

## 2020-11-23 DIAGNOSIS — R17 Unspecified jaundice: Secondary | ICD-10-CM | POA: Diagnosis not present

## 2020-11-23 DIAGNOSIS — N2589 Other disorders resulting from impaired renal tubular function: Secondary | ICD-10-CM | POA: Diagnosis not present

## 2020-11-23 DIAGNOSIS — D631 Anemia in chronic kidney disease: Secondary | ICD-10-CM | POA: Diagnosis not present

## 2020-11-23 DIAGNOSIS — Z992 Dependence on renal dialysis: Secondary | ICD-10-CM | POA: Diagnosis not present

## 2020-11-23 DIAGNOSIS — N186 End stage renal disease: Secondary | ICD-10-CM | POA: Diagnosis not present

## 2020-11-23 DIAGNOSIS — D509 Iron deficiency anemia, unspecified: Secondary | ICD-10-CM | POA: Diagnosis not present

## 2020-11-23 DIAGNOSIS — Z79899 Other long term (current) drug therapy: Secondary | ICD-10-CM | POA: Diagnosis not present

## 2020-11-23 DIAGNOSIS — N2581 Secondary hyperparathyroidism of renal origin: Secondary | ICD-10-CM | POA: Diagnosis not present

## 2020-11-24 DIAGNOSIS — R17 Unspecified jaundice: Secondary | ICD-10-CM | POA: Diagnosis not present

## 2020-11-24 DIAGNOSIS — N2589 Other disorders resulting from impaired renal tubular function: Secondary | ICD-10-CM | POA: Diagnosis not present

## 2020-11-24 DIAGNOSIS — D509 Iron deficiency anemia, unspecified: Secondary | ICD-10-CM | POA: Diagnosis not present

## 2020-11-24 DIAGNOSIS — E440 Moderate protein-calorie malnutrition: Secondary | ICD-10-CM | POA: Diagnosis not present

## 2020-11-24 DIAGNOSIS — Z992 Dependence on renal dialysis: Secondary | ICD-10-CM | POA: Diagnosis not present

## 2020-11-24 DIAGNOSIS — N2581 Secondary hyperparathyroidism of renal origin: Secondary | ICD-10-CM | POA: Diagnosis not present

## 2020-11-24 DIAGNOSIS — N186 End stage renal disease: Secondary | ICD-10-CM | POA: Diagnosis not present

## 2020-11-24 DIAGNOSIS — D631 Anemia in chronic kidney disease: Secondary | ICD-10-CM | POA: Diagnosis not present

## 2020-11-24 DIAGNOSIS — Z79899 Other long term (current) drug therapy: Secondary | ICD-10-CM | POA: Diagnosis not present

## 2020-11-25 DIAGNOSIS — N2589 Other disorders resulting from impaired renal tubular function: Secondary | ICD-10-CM | POA: Diagnosis not present

## 2020-11-25 DIAGNOSIS — N186 End stage renal disease: Secondary | ICD-10-CM | POA: Diagnosis not present

## 2020-11-25 DIAGNOSIS — N2581 Secondary hyperparathyroidism of renal origin: Secondary | ICD-10-CM | POA: Diagnosis not present

## 2020-11-25 DIAGNOSIS — D631 Anemia in chronic kidney disease: Secondary | ICD-10-CM | POA: Diagnosis not present

## 2020-11-25 DIAGNOSIS — R17 Unspecified jaundice: Secondary | ICD-10-CM | POA: Diagnosis not present

## 2020-11-25 DIAGNOSIS — Z992 Dependence on renal dialysis: Secondary | ICD-10-CM | POA: Diagnosis not present

## 2020-11-25 DIAGNOSIS — Z79899 Other long term (current) drug therapy: Secondary | ICD-10-CM | POA: Diagnosis not present

## 2020-11-25 DIAGNOSIS — E440 Moderate protein-calorie malnutrition: Secondary | ICD-10-CM | POA: Diagnosis not present

## 2020-11-25 DIAGNOSIS — D509 Iron deficiency anemia, unspecified: Secondary | ICD-10-CM | POA: Diagnosis not present

## 2020-11-26 DIAGNOSIS — N186 End stage renal disease: Secondary | ICD-10-CM | POA: Diagnosis not present

## 2020-11-26 DIAGNOSIS — Z79899 Other long term (current) drug therapy: Secondary | ICD-10-CM | POA: Diagnosis not present

## 2020-11-26 DIAGNOSIS — D509 Iron deficiency anemia, unspecified: Secondary | ICD-10-CM | POA: Diagnosis not present

## 2020-11-26 DIAGNOSIS — E440 Moderate protein-calorie malnutrition: Secondary | ICD-10-CM | POA: Diagnosis not present

## 2020-11-26 DIAGNOSIS — N2589 Other disorders resulting from impaired renal tubular function: Secondary | ICD-10-CM | POA: Diagnosis not present

## 2020-11-26 DIAGNOSIS — N2581 Secondary hyperparathyroidism of renal origin: Secondary | ICD-10-CM | POA: Diagnosis not present

## 2020-11-26 DIAGNOSIS — D631 Anemia in chronic kidney disease: Secondary | ICD-10-CM | POA: Diagnosis not present

## 2020-11-26 DIAGNOSIS — Z992 Dependence on renal dialysis: Secondary | ICD-10-CM | POA: Diagnosis not present

## 2020-11-26 DIAGNOSIS — R17 Unspecified jaundice: Secondary | ICD-10-CM | POA: Diagnosis not present

## 2020-11-27 DIAGNOSIS — R17 Unspecified jaundice: Secondary | ICD-10-CM | POA: Diagnosis not present

## 2020-11-27 DIAGNOSIS — N186 End stage renal disease: Secondary | ICD-10-CM | POA: Diagnosis not present

## 2020-11-27 DIAGNOSIS — Z992 Dependence on renal dialysis: Secondary | ICD-10-CM | POA: Diagnosis not present

## 2020-11-27 DIAGNOSIS — E440 Moderate protein-calorie malnutrition: Secondary | ICD-10-CM | POA: Diagnosis not present

## 2020-11-27 DIAGNOSIS — Z79899 Other long term (current) drug therapy: Secondary | ICD-10-CM | POA: Diagnosis not present

## 2020-11-27 DIAGNOSIS — N2589 Other disorders resulting from impaired renal tubular function: Secondary | ICD-10-CM | POA: Diagnosis not present

## 2020-11-27 DIAGNOSIS — N2581 Secondary hyperparathyroidism of renal origin: Secondary | ICD-10-CM | POA: Diagnosis not present

## 2020-11-27 DIAGNOSIS — D509 Iron deficiency anemia, unspecified: Secondary | ICD-10-CM | POA: Diagnosis not present

## 2020-11-27 DIAGNOSIS — D631 Anemia in chronic kidney disease: Secondary | ICD-10-CM | POA: Diagnosis not present

## 2020-11-28 DIAGNOSIS — D509 Iron deficiency anemia, unspecified: Secondary | ICD-10-CM | POA: Diagnosis not present

## 2020-11-28 DIAGNOSIS — N186 End stage renal disease: Secondary | ICD-10-CM | POA: Diagnosis not present

## 2020-11-28 DIAGNOSIS — Z79899 Other long term (current) drug therapy: Secondary | ICD-10-CM | POA: Diagnosis not present

## 2020-11-28 DIAGNOSIS — Z992 Dependence on renal dialysis: Secondary | ICD-10-CM | POA: Diagnosis not present

## 2020-11-28 DIAGNOSIS — N2589 Other disorders resulting from impaired renal tubular function: Secondary | ICD-10-CM | POA: Diagnosis not present

## 2020-11-28 DIAGNOSIS — R17 Unspecified jaundice: Secondary | ICD-10-CM | POA: Diagnosis not present

## 2020-11-28 DIAGNOSIS — N2581 Secondary hyperparathyroidism of renal origin: Secondary | ICD-10-CM | POA: Diagnosis not present

## 2020-11-28 DIAGNOSIS — D631 Anemia in chronic kidney disease: Secondary | ICD-10-CM | POA: Diagnosis not present

## 2020-11-28 DIAGNOSIS — E440 Moderate protein-calorie malnutrition: Secondary | ICD-10-CM | POA: Diagnosis not present

## 2020-11-29 DIAGNOSIS — E440 Moderate protein-calorie malnutrition: Secondary | ICD-10-CM | POA: Diagnosis not present

## 2020-11-29 DIAGNOSIS — N2589 Other disorders resulting from impaired renal tubular function: Secondary | ICD-10-CM | POA: Diagnosis not present

## 2020-11-29 DIAGNOSIS — Z79899 Other long term (current) drug therapy: Secondary | ICD-10-CM | POA: Diagnosis not present

## 2020-11-29 DIAGNOSIS — R17 Unspecified jaundice: Secondary | ICD-10-CM | POA: Diagnosis not present

## 2020-11-29 DIAGNOSIS — Z992 Dependence on renal dialysis: Secondary | ICD-10-CM | POA: Diagnosis not present

## 2020-11-29 DIAGNOSIS — N186 End stage renal disease: Secondary | ICD-10-CM | POA: Diagnosis not present

## 2020-11-29 DIAGNOSIS — N2581 Secondary hyperparathyroidism of renal origin: Secondary | ICD-10-CM | POA: Diagnosis not present

## 2020-11-29 DIAGNOSIS — D509 Iron deficiency anemia, unspecified: Secondary | ICD-10-CM | POA: Diagnosis not present

## 2020-11-29 DIAGNOSIS — D631 Anemia in chronic kidney disease: Secondary | ICD-10-CM | POA: Diagnosis not present

## 2020-11-30 DIAGNOSIS — Z992 Dependence on renal dialysis: Secondary | ICD-10-CM | POA: Diagnosis not present

## 2020-11-30 DIAGNOSIS — N186 End stage renal disease: Secondary | ICD-10-CM | POA: Diagnosis not present

## 2020-11-30 DIAGNOSIS — E440 Moderate protein-calorie malnutrition: Secondary | ICD-10-CM | POA: Diagnosis not present

## 2020-11-30 DIAGNOSIS — R17 Unspecified jaundice: Secondary | ICD-10-CM | POA: Diagnosis not present

## 2020-11-30 DIAGNOSIS — D631 Anemia in chronic kidney disease: Secondary | ICD-10-CM | POA: Diagnosis not present

## 2020-11-30 DIAGNOSIS — D509 Iron deficiency anemia, unspecified: Secondary | ICD-10-CM | POA: Diagnosis not present

## 2020-11-30 DIAGNOSIS — N2581 Secondary hyperparathyroidism of renal origin: Secondary | ICD-10-CM | POA: Diagnosis not present

## 2020-11-30 DIAGNOSIS — N2589 Other disorders resulting from impaired renal tubular function: Secondary | ICD-10-CM | POA: Diagnosis not present

## 2020-11-30 DIAGNOSIS — Z79899 Other long term (current) drug therapy: Secondary | ICD-10-CM | POA: Diagnosis not present

## 2020-12-01 DIAGNOSIS — R17 Unspecified jaundice: Secondary | ICD-10-CM | POA: Diagnosis not present

## 2020-12-01 DIAGNOSIS — E440 Moderate protein-calorie malnutrition: Secondary | ICD-10-CM | POA: Diagnosis not present

## 2020-12-01 DIAGNOSIS — N186 End stage renal disease: Secondary | ICD-10-CM | POA: Diagnosis not present

## 2020-12-01 DIAGNOSIS — N2589 Other disorders resulting from impaired renal tubular function: Secondary | ICD-10-CM | POA: Diagnosis not present

## 2020-12-01 DIAGNOSIS — Z992 Dependence on renal dialysis: Secondary | ICD-10-CM | POA: Diagnosis not present

## 2020-12-01 DIAGNOSIS — D631 Anemia in chronic kidney disease: Secondary | ICD-10-CM | POA: Diagnosis not present

## 2020-12-01 DIAGNOSIS — Z79899 Other long term (current) drug therapy: Secondary | ICD-10-CM | POA: Diagnosis not present

## 2020-12-01 DIAGNOSIS — N2581 Secondary hyperparathyroidism of renal origin: Secondary | ICD-10-CM | POA: Diagnosis not present

## 2020-12-01 DIAGNOSIS — D509 Iron deficiency anemia, unspecified: Secondary | ICD-10-CM | POA: Diagnosis not present

## 2020-12-02 DIAGNOSIS — Z79899 Other long term (current) drug therapy: Secondary | ICD-10-CM | POA: Diagnosis not present

## 2020-12-02 DIAGNOSIS — D509 Iron deficiency anemia, unspecified: Secondary | ICD-10-CM | POA: Diagnosis not present

## 2020-12-02 DIAGNOSIS — N2581 Secondary hyperparathyroidism of renal origin: Secondary | ICD-10-CM | POA: Diagnosis not present

## 2020-12-02 DIAGNOSIS — D631 Anemia in chronic kidney disease: Secondary | ICD-10-CM | POA: Diagnosis not present

## 2020-12-02 DIAGNOSIS — Z992 Dependence on renal dialysis: Secondary | ICD-10-CM | POA: Diagnosis not present

## 2020-12-02 DIAGNOSIS — R17 Unspecified jaundice: Secondary | ICD-10-CM | POA: Diagnosis not present

## 2020-12-02 DIAGNOSIS — N186 End stage renal disease: Secondary | ICD-10-CM | POA: Diagnosis not present

## 2020-12-02 DIAGNOSIS — N2589 Other disorders resulting from impaired renal tubular function: Secondary | ICD-10-CM | POA: Diagnosis not present

## 2020-12-02 DIAGNOSIS — E440 Moderate protein-calorie malnutrition: Secondary | ICD-10-CM | POA: Diagnosis not present

## 2020-12-03 DIAGNOSIS — N186 End stage renal disease: Secondary | ICD-10-CM | POA: Diagnosis not present

## 2020-12-03 DIAGNOSIS — E440 Moderate protein-calorie malnutrition: Secondary | ICD-10-CM | POA: Diagnosis not present

## 2020-12-03 DIAGNOSIS — N2589 Other disorders resulting from impaired renal tubular function: Secondary | ICD-10-CM | POA: Diagnosis not present

## 2020-12-03 DIAGNOSIS — N2581 Secondary hyperparathyroidism of renal origin: Secondary | ICD-10-CM | POA: Diagnosis not present

## 2020-12-03 DIAGNOSIS — D631 Anemia in chronic kidney disease: Secondary | ICD-10-CM | POA: Diagnosis not present

## 2020-12-03 DIAGNOSIS — Z992 Dependence on renal dialysis: Secondary | ICD-10-CM | POA: Diagnosis not present

## 2020-12-03 DIAGNOSIS — Z79899 Other long term (current) drug therapy: Secondary | ICD-10-CM | POA: Diagnosis not present

## 2020-12-03 DIAGNOSIS — R17 Unspecified jaundice: Secondary | ICD-10-CM | POA: Diagnosis not present

## 2020-12-03 DIAGNOSIS — D509 Iron deficiency anemia, unspecified: Secondary | ICD-10-CM | POA: Diagnosis not present

## 2020-12-04 DIAGNOSIS — D509 Iron deficiency anemia, unspecified: Secondary | ICD-10-CM | POA: Diagnosis not present

## 2020-12-04 DIAGNOSIS — R17 Unspecified jaundice: Secondary | ICD-10-CM | POA: Diagnosis not present

## 2020-12-04 DIAGNOSIS — Z992 Dependence on renal dialysis: Secondary | ICD-10-CM | POA: Diagnosis not present

## 2020-12-04 DIAGNOSIS — N2581 Secondary hyperparathyroidism of renal origin: Secondary | ICD-10-CM | POA: Diagnosis not present

## 2020-12-04 DIAGNOSIS — N2589 Other disorders resulting from impaired renal tubular function: Secondary | ICD-10-CM | POA: Diagnosis not present

## 2020-12-04 DIAGNOSIS — Z79899 Other long term (current) drug therapy: Secondary | ICD-10-CM | POA: Diagnosis not present

## 2020-12-04 DIAGNOSIS — D631 Anemia in chronic kidney disease: Secondary | ICD-10-CM | POA: Diagnosis not present

## 2020-12-04 DIAGNOSIS — N186 End stage renal disease: Secondary | ICD-10-CM | POA: Diagnosis not present

## 2020-12-04 DIAGNOSIS — E440 Moderate protein-calorie malnutrition: Secondary | ICD-10-CM | POA: Diagnosis not present

## 2020-12-05 DIAGNOSIS — E440 Moderate protein-calorie malnutrition: Secondary | ICD-10-CM | POA: Diagnosis not present

## 2020-12-05 DIAGNOSIS — N2581 Secondary hyperparathyroidism of renal origin: Secondary | ICD-10-CM | POA: Diagnosis not present

## 2020-12-05 DIAGNOSIS — N186 End stage renal disease: Secondary | ICD-10-CM | POA: Diagnosis not present

## 2020-12-05 DIAGNOSIS — R17 Unspecified jaundice: Secondary | ICD-10-CM | POA: Diagnosis not present

## 2020-12-05 DIAGNOSIS — D631 Anemia in chronic kidney disease: Secondary | ICD-10-CM | POA: Diagnosis not present

## 2020-12-05 DIAGNOSIS — D509 Iron deficiency anemia, unspecified: Secondary | ICD-10-CM | POA: Diagnosis not present

## 2020-12-05 DIAGNOSIS — Z79899 Other long term (current) drug therapy: Secondary | ICD-10-CM | POA: Diagnosis not present

## 2020-12-05 DIAGNOSIS — N2589 Other disorders resulting from impaired renal tubular function: Secondary | ICD-10-CM | POA: Diagnosis not present

## 2020-12-05 DIAGNOSIS — Z992 Dependence on renal dialysis: Secondary | ICD-10-CM | POA: Diagnosis not present

## 2020-12-06 DIAGNOSIS — E440 Moderate protein-calorie malnutrition: Secondary | ICD-10-CM | POA: Diagnosis not present

## 2020-12-06 DIAGNOSIS — N2589 Other disorders resulting from impaired renal tubular function: Secondary | ICD-10-CM | POA: Diagnosis not present

## 2020-12-06 DIAGNOSIS — Z992 Dependence on renal dialysis: Secondary | ICD-10-CM | POA: Diagnosis not present

## 2020-12-06 DIAGNOSIS — D631 Anemia in chronic kidney disease: Secondary | ICD-10-CM | POA: Diagnosis not present

## 2020-12-06 DIAGNOSIS — N186 End stage renal disease: Secondary | ICD-10-CM | POA: Diagnosis not present

## 2020-12-06 DIAGNOSIS — D509 Iron deficiency anemia, unspecified: Secondary | ICD-10-CM | POA: Diagnosis not present

## 2020-12-06 DIAGNOSIS — N2581 Secondary hyperparathyroidism of renal origin: Secondary | ICD-10-CM | POA: Diagnosis not present

## 2020-12-06 DIAGNOSIS — Z79899 Other long term (current) drug therapy: Secondary | ICD-10-CM | POA: Diagnosis not present

## 2020-12-06 DIAGNOSIS — R17 Unspecified jaundice: Secondary | ICD-10-CM | POA: Diagnosis not present

## 2020-12-07 DIAGNOSIS — R17 Unspecified jaundice: Secondary | ICD-10-CM | POA: Diagnosis not present

## 2020-12-07 DIAGNOSIS — N2589 Other disorders resulting from impaired renal tubular function: Secondary | ICD-10-CM | POA: Diagnosis not present

## 2020-12-07 DIAGNOSIS — E440 Moderate protein-calorie malnutrition: Secondary | ICD-10-CM | POA: Diagnosis not present

## 2020-12-07 DIAGNOSIS — D509 Iron deficiency anemia, unspecified: Secondary | ICD-10-CM | POA: Diagnosis not present

## 2020-12-07 DIAGNOSIS — Z79899 Other long term (current) drug therapy: Secondary | ICD-10-CM | POA: Diagnosis not present

## 2020-12-07 DIAGNOSIS — D631 Anemia in chronic kidney disease: Secondary | ICD-10-CM | POA: Diagnosis not present

## 2020-12-07 DIAGNOSIS — N186 End stage renal disease: Secondary | ICD-10-CM | POA: Diagnosis not present

## 2020-12-07 DIAGNOSIS — Z992 Dependence on renal dialysis: Secondary | ICD-10-CM | POA: Diagnosis not present

## 2020-12-07 DIAGNOSIS — N2581 Secondary hyperparathyroidism of renal origin: Secondary | ICD-10-CM | POA: Diagnosis not present

## 2020-12-08 DIAGNOSIS — E1122 Type 2 diabetes mellitus with diabetic chronic kidney disease: Secondary | ICD-10-CM | POA: Diagnosis not present

## 2020-12-08 DIAGNOSIS — N2581 Secondary hyperparathyroidism of renal origin: Secondary | ICD-10-CM | POA: Diagnosis not present

## 2020-12-08 DIAGNOSIS — N2589 Other disorders resulting from impaired renal tubular function: Secondary | ICD-10-CM | POA: Diagnosis not present

## 2020-12-08 DIAGNOSIS — D631 Anemia in chronic kidney disease: Secondary | ICD-10-CM | POA: Diagnosis not present

## 2020-12-08 DIAGNOSIS — Z992 Dependence on renal dialysis: Secondary | ICD-10-CM | POA: Diagnosis not present

## 2020-12-08 DIAGNOSIS — R17 Unspecified jaundice: Secondary | ICD-10-CM | POA: Diagnosis not present

## 2020-12-08 DIAGNOSIS — N186 End stage renal disease: Secondary | ICD-10-CM | POA: Diagnosis not present

## 2020-12-08 DIAGNOSIS — E440 Moderate protein-calorie malnutrition: Secondary | ICD-10-CM | POA: Diagnosis not present

## 2020-12-08 DIAGNOSIS — D509 Iron deficiency anemia, unspecified: Secondary | ICD-10-CM | POA: Diagnosis not present

## 2020-12-08 DIAGNOSIS — Z79899 Other long term (current) drug therapy: Secondary | ICD-10-CM | POA: Diagnosis not present

## 2020-12-09 DIAGNOSIS — R17 Unspecified jaundice: Secondary | ICD-10-CM | POA: Diagnosis not present

## 2020-12-09 DIAGNOSIS — N186 End stage renal disease: Secondary | ICD-10-CM | POA: Diagnosis not present

## 2020-12-09 DIAGNOSIS — D509 Iron deficiency anemia, unspecified: Secondary | ICD-10-CM | POA: Diagnosis not present

## 2020-12-09 DIAGNOSIS — E440 Moderate protein-calorie malnutrition: Secondary | ICD-10-CM | POA: Diagnosis not present

## 2020-12-09 DIAGNOSIS — N2589 Other disorders resulting from impaired renal tubular function: Secondary | ICD-10-CM | POA: Diagnosis not present

## 2020-12-09 DIAGNOSIS — Z992 Dependence on renal dialysis: Secondary | ICD-10-CM | POA: Diagnosis not present

## 2020-12-09 DIAGNOSIS — N2581 Secondary hyperparathyroidism of renal origin: Secondary | ICD-10-CM | POA: Diagnosis not present

## 2020-12-09 DIAGNOSIS — Z79899 Other long term (current) drug therapy: Secondary | ICD-10-CM | POA: Diagnosis not present

## 2020-12-09 DIAGNOSIS — D631 Anemia in chronic kidney disease: Secondary | ICD-10-CM | POA: Diagnosis not present

## 2020-12-10 DIAGNOSIS — D509 Iron deficiency anemia, unspecified: Secondary | ICD-10-CM | POA: Diagnosis not present

## 2020-12-10 DIAGNOSIS — Z79899 Other long term (current) drug therapy: Secondary | ICD-10-CM | POA: Diagnosis not present

## 2020-12-10 DIAGNOSIS — R17 Unspecified jaundice: Secondary | ICD-10-CM | POA: Diagnosis not present

## 2020-12-10 DIAGNOSIS — E440 Moderate protein-calorie malnutrition: Secondary | ICD-10-CM | POA: Diagnosis not present

## 2020-12-10 DIAGNOSIS — Z992 Dependence on renal dialysis: Secondary | ICD-10-CM | POA: Diagnosis not present

## 2020-12-10 DIAGNOSIS — D631 Anemia in chronic kidney disease: Secondary | ICD-10-CM | POA: Diagnosis not present

## 2020-12-10 DIAGNOSIS — N2589 Other disorders resulting from impaired renal tubular function: Secondary | ICD-10-CM | POA: Diagnosis not present

## 2020-12-10 DIAGNOSIS — N186 End stage renal disease: Secondary | ICD-10-CM | POA: Diagnosis not present

## 2020-12-10 DIAGNOSIS — N2581 Secondary hyperparathyroidism of renal origin: Secondary | ICD-10-CM | POA: Diagnosis not present

## 2020-12-11 DIAGNOSIS — N186 End stage renal disease: Secondary | ICD-10-CM | POA: Diagnosis not present

## 2020-12-11 DIAGNOSIS — Z992 Dependence on renal dialysis: Secondary | ICD-10-CM | POA: Diagnosis not present

## 2020-12-11 DIAGNOSIS — E440 Moderate protein-calorie malnutrition: Secondary | ICD-10-CM | POA: Diagnosis not present

## 2020-12-11 DIAGNOSIS — D509 Iron deficiency anemia, unspecified: Secondary | ICD-10-CM | POA: Diagnosis not present

## 2020-12-11 DIAGNOSIS — D631 Anemia in chronic kidney disease: Secondary | ICD-10-CM | POA: Diagnosis not present

## 2020-12-11 DIAGNOSIS — N2589 Other disorders resulting from impaired renal tubular function: Secondary | ICD-10-CM | POA: Diagnosis not present

## 2020-12-11 DIAGNOSIS — Z79899 Other long term (current) drug therapy: Secondary | ICD-10-CM | POA: Diagnosis not present

## 2020-12-11 DIAGNOSIS — N2581 Secondary hyperparathyroidism of renal origin: Secondary | ICD-10-CM | POA: Diagnosis not present

## 2020-12-11 DIAGNOSIS — R17 Unspecified jaundice: Secondary | ICD-10-CM | POA: Diagnosis not present

## 2020-12-12 DIAGNOSIS — N2589 Other disorders resulting from impaired renal tubular function: Secondary | ICD-10-CM | POA: Diagnosis not present

## 2020-12-12 DIAGNOSIS — N2581 Secondary hyperparathyroidism of renal origin: Secondary | ICD-10-CM | POA: Diagnosis not present

## 2020-12-12 DIAGNOSIS — N186 End stage renal disease: Secondary | ICD-10-CM | POA: Diagnosis not present

## 2020-12-12 DIAGNOSIS — Z992 Dependence on renal dialysis: Secondary | ICD-10-CM | POA: Diagnosis not present

## 2020-12-12 DIAGNOSIS — Z79899 Other long term (current) drug therapy: Secondary | ICD-10-CM | POA: Diagnosis not present

## 2020-12-12 DIAGNOSIS — R17 Unspecified jaundice: Secondary | ICD-10-CM | POA: Diagnosis not present

## 2020-12-12 DIAGNOSIS — D509 Iron deficiency anemia, unspecified: Secondary | ICD-10-CM | POA: Diagnosis not present

## 2020-12-12 DIAGNOSIS — E440 Moderate protein-calorie malnutrition: Secondary | ICD-10-CM | POA: Diagnosis not present

## 2020-12-12 DIAGNOSIS — D631 Anemia in chronic kidney disease: Secondary | ICD-10-CM | POA: Diagnosis not present

## 2020-12-13 DIAGNOSIS — D509 Iron deficiency anemia, unspecified: Secondary | ICD-10-CM | POA: Diagnosis not present

## 2020-12-13 DIAGNOSIS — Z79899 Other long term (current) drug therapy: Secondary | ICD-10-CM | POA: Diagnosis not present

## 2020-12-13 DIAGNOSIS — Z992 Dependence on renal dialysis: Secondary | ICD-10-CM | POA: Diagnosis not present

## 2020-12-13 DIAGNOSIS — N2589 Other disorders resulting from impaired renal tubular function: Secondary | ICD-10-CM | POA: Diagnosis not present

## 2020-12-13 DIAGNOSIS — E440 Moderate protein-calorie malnutrition: Secondary | ICD-10-CM | POA: Diagnosis not present

## 2020-12-13 DIAGNOSIS — N2581 Secondary hyperparathyroidism of renal origin: Secondary | ICD-10-CM | POA: Diagnosis not present

## 2020-12-13 DIAGNOSIS — N186 End stage renal disease: Secondary | ICD-10-CM | POA: Diagnosis not present

## 2020-12-13 DIAGNOSIS — R17 Unspecified jaundice: Secondary | ICD-10-CM | POA: Diagnosis not present

## 2020-12-13 DIAGNOSIS — D631 Anemia in chronic kidney disease: Secondary | ICD-10-CM | POA: Diagnosis not present

## 2020-12-14 DIAGNOSIS — D631 Anemia in chronic kidney disease: Secondary | ICD-10-CM | POA: Diagnosis not present

## 2020-12-14 DIAGNOSIS — D509 Iron deficiency anemia, unspecified: Secondary | ICD-10-CM | POA: Diagnosis not present

## 2020-12-14 DIAGNOSIS — N2581 Secondary hyperparathyroidism of renal origin: Secondary | ICD-10-CM | POA: Diagnosis not present

## 2020-12-14 DIAGNOSIS — R17 Unspecified jaundice: Secondary | ICD-10-CM | POA: Diagnosis not present

## 2020-12-14 DIAGNOSIS — Z992 Dependence on renal dialysis: Secondary | ICD-10-CM | POA: Diagnosis not present

## 2020-12-14 DIAGNOSIS — Z79899 Other long term (current) drug therapy: Secondary | ICD-10-CM | POA: Diagnosis not present

## 2020-12-14 DIAGNOSIS — N2589 Other disorders resulting from impaired renal tubular function: Secondary | ICD-10-CM | POA: Diagnosis not present

## 2020-12-14 DIAGNOSIS — E440 Moderate protein-calorie malnutrition: Secondary | ICD-10-CM | POA: Diagnosis not present

## 2020-12-14 DIAGNOSIS — N186 End stage renal disease: Secondary | ICD-10-CM | POA: Diagnosis not present

## 2020-12-15 DIAGNOSIS — E440 Moderate protein-calorie malnutrition: Secondary | ICD-10-CM | POA: Diagnosis not present

## 2020-12-15 DIAGNOSIS — D631 Anemia in chronic kidney disease: Secondary | ICD-10-CM | POA: Diagnosis not present

## 2020-12-15 DIAGNOSIS — D509 Iron deficiency anemia, unspecified: Secondary | ICD-10-CM | POA: Diagnosis not present

## 2020-12-15 DIAGNOSIS — Z79899 Other long term (current) drug therapy: Secondary | ICD-10-CM | POA: Diagnosis not present

## 2020-12-15 DIAGNOSIS — N186 End stage renal disease: Secondary | ICD-10-CM | POA: Diagnosis not present

## 2020-12-15 DIAGNOSIS — Z992 Dependence on renal dialysis: Secondary | ICD-10-CM | POA: Diagnosis not present

## 2020-12-15 DIAGNOSIS — N2589 Other disorders resulting from impaired renal tubular function: Secondary | ICD-10-CM | POA: Diagnosis not present

## 2020-12-15 DIAGNOSIS — R17 Unspecified jaundice: Secondary | ICD-10-CM | POA: Diagnosis not present

## 2020-12-15 DIAGNOSIS — N2581 Secondary hyperparathyroidism of renal origin: Secondary | ICD-10-CM | POA: Diagnosis not present

## 2020-12-16 DIAGNOSIS — L97511 Non-pressure chronic ulcer of other part of right foot limited to breakdown of skin: Secondary | ICD-10-CM | POA: Diagnosis not present

## 2020-12-16 DIAGNOSIS — N2589 Other disorders resulting from impaired renal tubular function: Secondary | ICD-10-CM | POA: Diagnosis not present

## 2020-12-16 DIAGNOSIS — D631 Anemia in chronic kidney disease: Secondary | ICD-10-CM | POA: Diagnosis not present

## 2020-12-16 DIAGNOSIS — Z89422 Acquired absence of other left toe(s): Secondary | ICD-10-CM | POA: Diagnosis not present

## 2020-12-16 DIAGNOSIS — N186 End stage renal disease: Secondary | ICD-10-CM | POA: Diagnosis not present

## 2020-12-16 DIAGNOSIS — Z794 Long term (current) use of insulin: Secondary | ICD-10-CM | POA: Diagnosis not present

## 2020-12-16 DIAGNOSIS — M14672 Charcot's joint, left ankle and foot: Secondary | ICD-10-CM | POA: Diagnosis not present

## 2020-12-16 DIAGNOSIS — Z79899 Other long term (current) drug therapy: Secondary | ICD-10-CM | POA: Diagnosis not present

## 2020-12-16 DIAGNOSIS — N2581 Secondary hyperparathyroidism of renal origin: Secondary | ICD-10-CM | POA: Diagnosis not present

## 2020-12-16 DIAGNOSIS — E440 Moderate protein-calorie malnutrition: Secondary | ICD-10-CM | POA: Diagnosis not present

## 2020-12-16 DIAGNOSIS — Z992 Dependence on renal dialysis: Secondary | ICD-10-CM | POA: Diagnosis not present

## 2020-12-16 DIAGNOSIS — E11621 Type 2 diabetes mellitus with foot ulcer: Secondary | ICD-10-CM | POA: Diagnosis not present

## 2020-12-16 DIAGNOSIS — L97522 Non-pressure chronic ulcer of other part of left foot with fat layer exposed: Secondary | ICD-10-CM | POA: Diagnosis not present

## 2020-12-16 DIAGNOSIS — D509 Iron deficiency anemia, unspecified: Secondary | ICD-10-CM | POA: Diagnosis not present

## 2020-12-16 DIAGNOSIS — R17 Unspecified jaundice: Secondary | ICD-10-CM | POA: Diagnosis not present

## 2020-12-16 DIAGNOSIS — L97509 Non-pressure chronic ulcer of other part of unspecified foot with unspecified severity: Secondary | ICD-10-CM | POA: Diagnosis not present

## 2020-12-17 DIAGNOSIS — E440 Moderate protein-calorie malnutrition: Secondary | ICD-10-CM | POA: Diagnosis not present

## 2020-12-17 DIAGNOSIS — Z79899 Other long term (current) drug therapy: Secondary | ICD-10-CM | POA: Diagnosis not present

## 2020-12-17 DIAGNOSIS — D631 Anemia in chronic kidney disease: Secondary | ICD-10-CM | POA: Diagnosis not present

## 2020-12-17 DIAGNOSIS — D509 Iron deficiency anemia, unspecified: Secondary | ICD-10-CM | POA: Diagnosis not present

## 2020-12-17 DIAGNOSIS — N2581 Secondary hyperparathyroidism of renal origin: Secondary | ICD-10-CM | POA: Diagnosis not present

## 2020-12-17 DIAGNOSIS — Z992 Dependence on renal dialysis: Secondary | ICD-10-CM | POA: Diagnosis not present

## 2020-12-17 DIAGNOSIS — N186 End stage renal disease: Secondary | ICD-10-CM | POA: Diagnosis not present

## 2020-12-17 DIAGNOSIS — N2589 Other disorders resulting from impaired renal tubular function: Secondary | ICD-10-CM | POA: Diagnosis not present

## 2020-12-17 DIAGNOSIS — R17 Unspecified jaundice: Secondary | ICD-10-CM | POA: Diagnosis not present

## 2020-12-18 DIAGNOSIS — N2589 Other disorders resulting from impaired renal tubular function: Secondary | ICD-10-CM | POA: Diagnosis not present

## 2020-12-18 DIAGNOSIS — Z79899 Other long term (current) drug therapy: Secondary | ICD-10-CM | POA: Diagnosis not present

## 2020-12-18 DIAGNOSIS — D631 Anemia in chronic kidney disease: Secondary | ICD-10-CM | POA: Diagnosis not present

## 2020-12-18 DIAGNOSIS — Z992 Dependence on renal dialysis: Secondary | ICD-10-CM | POA: Diagnosis not present

## 2020-12-18 DIAGNOSIS — E440 Moderate protein-calorie malnutrition: Secondary | ICD-10-CM | POA: Diagnosis not present

## 2020-12-18 DIAGNOSIS — D509 Iron deficiency anemia, unspecified: Secondary | ICD-10-CM | POA: Diagnosis not present

## 2020-12-18 DIAGNOSIS — N2581 Secondary hyperparathyroidism of renal origin: Secondary | ICD-10-CM | POA: Diagnosis not present

## 2020-12-18 DIAGNOSIS — R17 Unspecified jaundice: Secondary | ICD-10-CM | POA: Diagnosis not present

## 2020-12-18 DIAGNOSIS — N186 End stage renal disease: Secondary | ICD-10-CM | POA: Diagnosis not present

## 2020-12-19 DIAGNOSIS — D509 Iron deficiency anemia, unspecified: Secondary | ICD-10-CM | POA: Diagnosis not present

## 2020-12-19 DIAGNOSIS — N2589 Other disorders resulting from impaired renal tubular function: Secondary | ICD-10-CM | POA: Diagnosis not present

## 2020-12-19 DIAGNOSIS — Z79899 Other long term (current) drug therapy: Secondary | ICD-10-CM | POA: Diagnosis not present

## 2020-12-19 DIAGNOSIS — Z992 Dependence on renal dialysis: Secondary | ICD-10-CM | POA: Diagnosis not present

## 2020-12-19 DIAGNOSIS — N2581 Secondary hyperparathyroidism of renal origin: Secondary | ICD-10-CM | POA: Diagnosis not present

## 2020-12-19 DIAGNOSIS — R17 Unspecified jaundice: Secondary | ICD-10-CM | POA: Diagnosis not present

## 2020-12-19 DIAGNOSIS — N186 End stage renal disease: Secondary | ICD-10-CM | POA: Diagnosis not present

## 2020-12-19 DIAGNOSIS — E440 Moderate protein-calorie malnutrition: Secondary | ICD-10-CM | POA: Diagnosis not present

## 2020-12-19 DIAGNOSIS — D631 Anemia in chronic kidney disease: Secondary | ICD-10-CM | POA: Diagnosis not present

## 2020-12-20 DIAGNOSIS — Z79899 Other long term (current) drug therapy: Secondary | ICD-10-CM | POA: Diagnosis not present

## 2020-12-20 DIAGNOSIS — R17 Unspecified jaundice: Secondary | ICD-10-CM | POA: Diagnosis not present

## 2020-12-20 DIAGNOSIS — Z992 Dependence on renal dialysis: Secondary | ICD-10-CM | POA: Diagnosis not present

## 2020-12-20 DIAGNOSIS — D509 Iron deficiency anemia, unspecified: Secondary | ICD-10-CM | POA: Diagnosis not present

## 2020-12-20 DIAGNOSIS — N2589 Other disorders resulting from impaired renal tubular function: Secondary | ICD-10-CM | POA: Diagnosis not present

## 2020-12-20 DIAGNOSIS — N186 End stage renal disease: Secondary | ICD-10-CM | POA: Diagnosis not present

## 2020-12-20 DIAGNOSIS — N2581 Secondary hyperparathyroidism of renal origin: Secondary | ICD-10-CM | POA: Diagnosis not present

## 2020-12-20 DIAGNOSIS — D631 Anemia in chronic kidney disease: Secondary | ICD-10-CM | POA: Diagnosis not present

## 2020-12-20 DIAGNOSIS — E440 Moderate protein-calorie malnutrition: Secondary | ICD-10-CM | POA: Diagnosis not present

## 2020-12-21 DIAGNOSIS — N2581 Secondary hyperparathyroidism of renal origin: Secondary | ICD-10-CM | POA: Diagnosis not present

## 2020-12-21 DIAGNOSIS — R17 Unspecified jaundice: Secondary | ICD-10-CM | POA: Diagnosis not present

## 2020-12-21 DIAGNOSIS — D631 Anemia in chronic kidney disease: Secondary | ICD-10-CM | POA: Diagnosis not present

## 2020-12-21 DIAGNOSIS — N2589 Other disorders resulting from impaired renal tubular function: Secondary | ICD-10-CM | POA: Diagnosis not present

## 2020-12-21 DIAGNOSIS — E440 Moderate protein-calorie malnutrition: Secondary | ICD-10-CM | POA: Diagnosis not present

## 2020-12-21 DIAGNOSIS — D509 Iron deficiency anemia, unspecified: Secondary | ICD-10-CM | POA: Diagnosis not present

## 2020-12-21 DIAGNOSIS — Z992 Dependence on renal dialysis: Secondary | ICD-10-CM | POA: Diagnosis not present

## 2020-12-21 DIAGNOSIS — Z79899 Other long term (current) drug therapy: Secondary | ICD-10-CM | POA: Diagnosis not present

## 2020-12-21 DIAGNOSIS — N186 End stage renal disease: Secondary | ICD-10-CM | POA: Diagnosis not present

## 2020-12-22 DIAGNOSIS — D631 Anemia in chronic kidney disease: Secondary | ICD-10-CM | POA: Diagnosis not present

## 2020-12-22 DIAGNOSIS — R17 Unspecified jaundice: Secondary | ICD-10-CM | POA: Diagnosis not present

## 2020-12-22 DIAGNOSIS — N2589 Other disorders resulting from impaired renal tubular function: Secondary | ICD-10-CM | POA: Diagnosis not present

## 2020-12-22 DIAGNOSIS — E440 Moderate protein-calorie malnutrition: Secondary | ICD-10-CM | POA: Diagnosis not present

## 2020-12-22 DIAGNOSIS — N2581 Secondary hyperparathyroidism of renal origin: Secondary | ICD-10-CM | POA: Diagnosis not present

## 2020-12-22 DIAGNOSIS — N186 End stage renal disease: Secondary | ICD-10-CM | POA: Diagnosis not present

## 2020-12-22 DIAGNOSIS — D509 Iron deficiency anemia, unspecified: Secondary | ICD-10-CM | POA: Diagnosis not present

## 2020-12-22 DIAGNOSIS — Z79899 Other long term (current) drug therapy: Secondary | ICD-10-CM | POA: Diagnosis not present

## 2020-12-22 DIAGNOSIS — Z992 Dependence on renal dialysis: Secondary | ICD-10-CM | POA: Diagnosis not present

## 2020-12-23 DIAGNOSIS — N2581 Secondary hyperparathyroidism of renal origin: Secondary | ICD-10-CM | POA: Diagnosis not present

## 2020-12-23 DIAGNOSIS — R17 Unspecified jaundice: Secondary | ICD-10-CM | POA: Diagnosis not present

## 2020-12-23 DIAGNOSIS — N186 End stage renal disease: Secondary | ICD-10-CM | POA: Diagnosis not present

## 2020-12-23 DIAGNOSIS — D509 Iron deficiency anemia, unspecified: Secondary | ICD-10-CM | POA: Diagnosis not present

## 2020-12-23 DIAGNOSIS — D631 Anemia in chronic kidney disease: Secondary | ICD-10-CM | POA: Diagnosis not present

## 2020-12-23 DIAGNOSIS — Z992 Dependence on renal dialysis: Secondary | ICD-10-CM | POA: Diagnosis not present

## 2020-12-23 DIAGNOSIS — N2589 Other disorders resulting from impaired renal tubular function: Secondary | ICD-10-CM | POA: Diagnosis not present

## 2020-12-23 DIAGNOSIS — E440 Moderate protein-calorie malnutrition: Secondary | ICD-10-CM | POA: Diagnosis not present

## 2020-12-23 DIAGNOSIS — Z79899 Other long term (current) drug therapy: Secondary | ICD-10-CM | POA: Diagnosis not present

## 2020-12-24 DIAGNOSIS — R17 Unspecified jaundice: Secondary | ICD-10-CM | POA: Diagnosis not present

## 2020-12-24 DIAGNOSIS — D631 Anemia in chronic kidney disease: Secondary | ICD-10-CM | POA: Diagnosis not present

## 2020-12-24 DIAGNOSIS — N2589 Other disorders resulting from impaired renal tubular function: Secondary | ICD-10-CM | POA: Diagnosis not present

## 2020-12-24 DIAGNOSIS — Z79899 Other long term (current) drug therapy: Secondary | ICD-10-CM | POA: Diagnosis not present

## 2020-12-24 DIAGNOSIS — E440 Moderate protein-calorie malnutrition: Secondary | ICD-10-CM | POA: Diagnosis not present

## 2020-12-24 DIAGNOSIS — N186 End stage renal disease: Secondary | ICD-10-CM | POA: Diagnosis not present

## 2020-12-24 DIAGNOSIS — Z992 Dependence on renal dialysis: Secondary | ICD-10-CM | POA: Diagnosis not present

## 2020-12-24 DIAGNOSIS — D509 Iron deficiency anemia, unspecified: Secondary | ICD-10-CM | POA: Diagnosis not present

## 2020-12-24 DIAGNOSIS — N2581 Secondary hyperparathyroidism of renal origin: Secondary | ICD-10-CM | POA: Diagnosis not present

## 2020-12-25 DIAGNOSIS — Z992 Dependence on renal dialysis: Secondary | ICD-10-CM | POA: Diagnosis not present

## 2020-12-25 DIAGNOSIS — N2589 Other disorders resulting from impaired renal tubular function: Secondary | ICD-10-CM | POA: Diagnosis not present

## 2020-12-25 DIAGNOSIS — N2581 Secondary hyperparathyroidism of renal origin: Secondary | ICD-10-CM | POA: Diagnosis not present

## 2020-12-25 DIAGNOSIS — D631 Anemia in chronic kidney disease: Secondary | ICD-10-CM | POA: Diagnosis not present

## 2020-12-25 DIAGNOSIS — R17 Unspecified jaundice: Secondary | ICD-10-CM | POA: Diagnosis not present

## 2020-12-25 DIAGNOSIS — N186 End stage renal disease: Secondary | ICD-10-CM | POA: Diagnosis not present

## 2020-12-25 DIAGNOSIS — Z79899 Other long term (current) drug therapy: Secondary | ICD-10-CM | POA: Diagnosis not present

## 2020-12-25 DIAGNOSIS — E440 Moderate protein-calorie malnutrition: Secondary | ICD-10-CM | POA: Diagnosis not present

## 2020-12-25 DIAGNOSIS — D509 Iron deficiency anemia, unspecified: Secondary | ICD-10-CM | POA: Diagnosis not present

## 2020-12-26 DIAGNOSIS — R17 Unspecified jaundice: Secondary | ICD-10-CM | POA: Diagnosis not present

## 2020-12-26 DIAGNOSIS — E440 Moderate protein-calorie malnutrition: Secondary | ICD-10-CM | POA: Diagnosis not present

## 2020-12-26 DIAGNOSIS — Z79899 Other long term (current) drug therapy: Secondary | ICD-10-CM | POA: Diagnosis not present

## 2020-12-26 DIAGNOSIS — Z992 Dependence on renal dialysis: Secondary | ICD-10-CM | POA: Diagnosis not present

## 2020-12-26 DIAGNOSIS — N2589 Other disorders resulting from impaired renal tubular function: Secondary | ICD-10-CM | POA: Diagnosis not present

## 2020-12-26 DIAGNOSIS — D631 Anemia in chronic kidney disease: Secondary | ICD-10-CM | POA: Diagnosis not present

## 2020-12-26 DIAGNOSIS — D509 Iron deficiency anemia, unspecified: Secondary | ICD-10-CM | POA: Diagnosis not present

## 2020-12-26 DIAGNOSIS — N2581 Secondary hyperparathyroidism of renal origin: Secondary | ICD-10-CM | POA: Diagnosis not present

## 2020-12-26 DIAGNOSIS — N186 End stage renal disease: Secondary | ICD-10-CM | POA: Diagnosis not present

## 2020-12-27 DIAGNOSIS — Z992 Dependence on renal dialysis: Secondary | ICD-10-CM | POA: Diagnosis not present

## 2020-12-27 DIAGNOSIS — E440 Moderate protein-calorie malnutrition: Secondary | ICD-10-CM | POA: Diagnosis not present

## 2020-12-27 DIAGNOSIS — N2589 Other disorders resulting from impaired renal tubular function: Secondary | ICD-10-CM | POA: Diagnosis not present

## 2020-12-27 DIAGNOSIS — R17 Unspecified jaundice: Secondary | ICD-10-CM | POA: Diagnosis not present

## 2020-12-27 DIAGNOSIS — D509 Iron deficiency anemia, unspecified: Secondary | ICD-10-CM | POA: Diagnosis not present

## 2020-12-27 DIAGNOSIS — N2581 Secondary hyperparathyroidism of renal origin: Secondary | ICD-10-CM | POA: Diagnosis not present

## 2020-12-27 DIAGNOSIS — N186 End stage renal disease: Secondary | ICD-10-CM | POA: Diagnosis not present

## 2020-12-27 DIAGNOSIS — D631 Anemia in chronic kidney disease: Secondary | ICD-10-CM | POA: Diagnosis not present

## 2020-12-27 DIAGNOSIS — Z79899 Other long term (current) drug therapy: Secondary | ICD-10-CM | POA: Diagnosis not present

## 2020-12-28 DIAGNOSIS — D631 Anemia in chronic kidney disease: Secondary | ICD-10-CM | POA: Diagnosis not present

## 2020-12-28 DIAGNOSIS — Z992 Dependence on renal dialysis: Secondary | ICD-10-CM | POA: Diagnosis not present

## 2020-12-28 DIAGNOSIS — N2581 Secondary hyperparathyroidism of renal origin: Secondary | ICD-10-CM | POA: Diagnosis not present

## 2020-12-28 DIAGNOSIS — R17 Unspecified jaundice: Secondary | ICD-10-CM | POA: Diagnosis not present

## 2020-12-28 DIAGNOSIS — E440 Moderate protein-calorie malnutrition: Secondary | ICD-10-CM | POA: Diagnosis not present

## 2020-12-28 DIAGNOSIS — N186 End stage renal disease: Secondary | ICD-10-CM | POA: Diagnosis not present

## 2020-12-28 DIAGNOSIS — Z79899 Other long term (current) drug therapy: Secondary | ICD-10-CM | POA: Diagnosis not present

## 2020-12-28 DIAGNOSIS — N2589 Other disorders resulting from impaired renal tubular function: Secondary | ICD-10-CM | POA: Diagnosis not present

## 2020-12-28 DIAGNOSIS — D509 Iron deficiency anemia, unspecified: Secondary | ICD-10-CM | POA: Diagnosis not present

## 2020-12-29 DIAGNOSIS — Z79899 Other long term (current) drug therapy: Secondary | ICD-10-CM | POA: Diagnosis not present

## 2020-12-29 DIAGNOSIS — Z992 Dependence on renal dialysis: Secondary | ICD-10-CM | POA: Diagnosis not present

## 2020-12-29 DIAGNOSIS — N2589 Other disorders resulting from impaired renal tubular function: Secondary | ICD-10-CM | POA: Diagnosis not present

## 2020-12-29 DIAGNOSIS — N186 End stage renal disease: Secondary | ICD-10-CM | POA: Diagnosis not present

## 2020-12-29 DIAGNOSIS — D631 Anemia in chronic kidney disease: Secondary | ICD-10-CM | POA: Diagnosis not present

## 2020-12-29 DIAGNOSIS — N2581 Secondary hyperparathyroidism of renal origin: Secondary | ICD-10-CM | POA: Diagnosis not present

## 2020-12-29 DIAGNOSIS — D509 Iron deficiency anemia, unspecified: Secondary | ICD-10-CM | POA: Diagnosis not present

## 2020-12-29 DIAGNOSIS — R17 Unspecified jaundice: Secondary | ICD-10-CM | POA: Diagnosis not present

## 2020-12-29 DIAGNOSIS — E440 Moderate protein-calorie malnutrition: Secondary | ICD-10-CM | POA: Diagnosis not present

## 2020-12-30 DIAGNOSIS — N2589 Other disorders resulting from impaired renal tubular function: Secondary | ICD-10-CM | POA: Diagnosis not present

## 2020-12-30 DIAGNOSIS — R17 Unspecified jaundice: Secondary | ICD-10-CM | POA: Diagnosis not present

## 2020-12-30 DIAGNOSIS — J209 Acute bronchitis, unspecified: Secondary | ICD-10-CM | POA: Diagnosis not present

## 2020-12-30 DIAGNOSIS — Z79899 Other long term (current) drug therapy: Secondary | ICD-10-CM | POA: Diagnosis not present

## 2020-12-30 DIAGNOSIS — Z992 Dependence on renal dialysis: Secondary | ICD-10-CM | POA: Diagnosis not present

## 2020-12-30 DIAGNOSIS — E440 Moderate protein-calorie malnutrition: Secondary | ICD-10-CM | POA: Diagnosis not present

## 2020-12-30 DIAGNOSIS — D509 Iron deficiency anemia, unspecified: Secondary | ICD-10-CM | POA: Diagnosis not present

## 2020-12-30 DIAGNOSIS — R062 Wheezing: Secondary | ICD-10-CM | POA: Diagnosis not present

## 2020-12-30 DIAGNOSIS — N2581 Secondary hyperparathyroidism of renal origin: Secondary | ICD-10-CM | POA: Diagnosis not present

## 2020-12-30 DIAGNOSIS — U071 COVID-19: Secondary | ICD-10-CM | POA: Diagnosis not present

## 2020-12-30 DIAGNOSIS — R059 Cough, unspecified: Secondary | ICD-10-CM | POA: Diagnosis not present

## 2020-12-30 DIAGNOSIS — N186 End stage renal disease: Secondary | ICD-10-CM | POA: Diagnosis not present

## 2020-12-30 DIAGNOSIS — D631 Anemia in chronic kidney disease: Secondary | ICD-10-CM | POA: Diagnosis not present

## 2020-12-31 DIAGNOSIS — D631 Anemia in chronic kidney disease: Secondary | ICD-10-CM | POA: Diagnosis not present

## 2020-12-31 DIAGNOSIS — N2589 Other disorders resulting from impaired renal tubular function: Secondary | ICD-10-CM | POA: Diagnosis not present

## 2020-12-31 DIAGNOSIS — R17 Unspecified jaundice: Secondary | ICD-10-CM | POA: Diagnosis not present

## 2020-12-31 DIAGNOSIS — N186 End stage renal disease: Secondary | ICD-10-CM | POA: Diagnosis not present

## 2020-12-31 DIAGNOSIS — E440 Moderate protein-calorie malnutrition: Secondary | ICD-10-CM | POA: Diagnosis not present

## 2020-12-31 DIAGNOSIS — N2581 Secondary hyperparathyroidism of renal origin: Secondary | ICD-10-CM | POA: Diagnosis not present

## 2020-12-31 DIAGNOSIS — Z79899 Other long term (current) drug therapy: Secondary | ICD-10-CM | POA: Diagnosis not present

## 2020-12-31 DIAGNOSIS — D509 Iron deficiency anemia, unspecified: Secondary | ICD-10-CM | POA: Diagnosis not present

## 2020-12-31 DIAGNOSIS — Z992 Dependence on renal dialysis: Secondary | ICD-10-CM | POA: Diagnosis not present

## 2021-01-01 DIAGNOSIS — R17 Unspecified jaundice: Secondary | ICD-10-CM | POA: Diagnosis not present

## 2021-01-01 DIAGNOSIS — E440 Moderate protein-calorie malnutrition: Secondary | ICD-10-CM | POA: Diagnosis not present

## 2021-01-01 DIAGNOSIS — N2589 Other disorders resulting from impaired renal tubular function: Secondary | ICD-10-CM | POA: Diagnosis not present

## 2021-01-01 DIAGNOSIS — D631 Anemia in chronic kidney disease: Secondary | ICD-10-CM | POA: Diagnosis not present

## 2021-01-01 DIAGNOSIS — N2581 Secondary hyperparathyroidism of renal origin: Secondary | ICD-10-CM | POA: Diagnosis not present

## 2021-01-01 DIAGNOSIS — Z79899 Other long term (current) drug therapy: Secondary | ICD-10-CM | POA: Diagnosis not present

## 2021-01-01 DIAGNOSIS — Z992 Dependence on renal dialysis: Secondary | ICD-10-CM | POA: Diagnosis not present

## 2021-01-01 DIAGNOSIS — D509 Iron deficiency anemia, unspecified: Secondary | ICD-10-CM | POA: Diagnosis not present

## 2021-01-01 DIAGNOSIS — N186 End stage renal disease: Secondary | ICD-10-CM | POA: Diagnosis not present

## 2021-01-02 DIAGNOSIS — N2589 Other disorders resulting from impaired renal tubular function: Secondary | ICD-10-CM | POA: Diagnosis not present

## 2021-01-02 DIAGNOSIS — Z992 Dependence on renal dialysis: Secondary | ICD-10-CM | POA: Diagnosis not present

## 2021-01-02 DIAGNOSIS — D631 Anemia in chronic kidney disease: Secondary | ICD-10-CM | POA: Diagnosis not present

## 2021-01-02 DIAGNOSIS — R17 Unspecified jaundice: Secondary | ICD-10-CM | POA: Diagnosis not present

## 2021-01-02 DIAGNOSIS — N2581 Secondary hyperparathyroidism of renal origin: Secondary | ICD-10-CM | POA: Diagnosis not present

## 2021-01-02 DIAGNOSIS — N186 End stage renal disease: Secondary | ICD-10-CM | POA: Diagnosis not present

## 2021-01-02 DIAGNOSIS — E440 Moderate protein-calorie malnutrition: Secondary | ICD-10-CM | POA: Diagnosis not present

## 2021-01-02 DIAGNOSIS — D509 Iron deficiency anemia, unspecified: Secondary | ICD-10-CM | POA: Diagnosis not present

## 2021-01-02 DIAGNOSIS — Z79899 Other long term (current) drug therapy: Secondary | ICD-10-CM | POA: Diagnosis not present

## 2021-01-03 DIAGNOSIS — N2581 Secondary hyperparathyroidism of renal origin: Secondary | ICD-10-CM | POA: Diagnosis not present

## 2021-01-03 DIAGNOSIS — Z992 Dependence on renal dialysis: Secondary | ICD-10-CM | POA: Diagnosis not present

## 2021-01-03 DIAGNOSIS — D509 Iron deficiency anemia, unspecified: Secondary | ICD-10-CM | POA: Diagnosis not present

## 2021-01-03 DIAGNOSIS — N2589 Other disorders resulting from impaired renal tubular function: Secondary | ICD-10-CM | POA: Diagnosis not present

## 2021-01-03 DIAGNOSIS — N186 End stage renal disease: Secondary | ICD-10-CM | POA: Diagnosis not present

## 2021-01-03 DIAGNOSIS — R17 Unspecified jaundice: Secondary | ICD-10-CM | POA: Diagnosis not present

## 2021-01-03 DIAGNOSIS — D631 Anemia in chronic kidney disease: Secondary | ICD-10-CM | POA: Diagnosis not present

## 2021-01-03 DIAGNOSIS — E440 Moderate protein-calorie malnutrition: Secondary | ICD-10-CM | POA: Diagnosis not present

## 2021-01-03 DIAGNOSIS — Z79899 Other long term (current) drug therapy: Secondary | ICD-10-CM | POA: Diagnosis not present

## 2021-01-04 DIAGNOSIS — E440 Moderate protein-calorie malnutrition: Secondary | ICD-10-CM | POA: Diagnosis not present

## 2021-01-04 DIAGNOSIS — N186 End stage renal disease: Secondary | ICD-10-CM | POA: Diagnosis not present

## 2021-01-04 DIAGNOSIS — D631 Anemia in chronic kidney disease: Secondary | ICD-10-CM | POA: Diagnosis not present

## 2021-01-04 DIAGNOSIS — D509 Iron deficiency anemia, unspecified: Secondary | ICD-10-CM | POA: Diagnosis not present

## 2021-01-04 DIAGNOSIS — Z992 Dependence on renal dialysis: Secondary | ICD-10-CM | POA: Diagnosis not present

## 2021-01-04 DIAGNOSIS — Z79899 Other long term (current) drug therapy: Secondary | ICD-10-CM | POA: Diagnosis not present

## 2021-01-04 DIAGNOSIS — N2581 Secondary hyperparathyroidism of renal origin: Secondary | ICD-10-CM | POA: Diagnosis not present

## 2021-01-04 DIAGNOSIS — N2589 Other disorders resulting from impaired renal tubular function: Secondary | ICD-10-CM | POA: Diagnosis not present

## 2021-01-04 DIAGNOSIS — R17 Unspecified jaundice: Secondary | ICD-10-CM | POA: Diagnosis not present

## 2021-01-05 DIAGNOSIS — D509 Iron deficiency anemia, unspecified: Secondary | ICD-10-CM | POA: Diagnosis not present

## 2021-01-05 DIAGNOSIS — N2581 Secondary hyperparathyroidism of renal origin: Secondary | ICD-10-CM | POA: Diagnosis not present

## 2021-01-05 DIAGNOSIS — D631 Anemia in chronic kidney disease: Secondary | ICD-10-CM | POA: Diagnosis not present

## 2021-01-05 DIAGNOSIS — Z79899 Other long term (current) drug therapy: Secondary | ICD-10-CM | POA: Diagnosis not present

## 2021-01-05 DIAGNOSIS — E440 Moderate protein-calorie malnutrition: Secondary | ICD-10-CM | POA: Diagnosis not present

## 2021-01-05 DIAGNOSIS — R17 Unspecified jaundice: Secondary | ICD-10-CM | POA: Diagnosis not present

## 2021-01-05 DIAGNOSIS — E1122 Type 2 diabetes mellitus with diabetic chronic kidney disease: Secondary | ICD-10-CM | POA: Diagnosis not present

## 2021-01-05 DIAGNOSIS — N2589 Other disorders resulting from impaired renal tubular function: Secondary | ICD-10-CM | POA: Diagnosis not present

## 2021-01-05 DIAGNOSIS — N186 End stage renal disease: Secondary | ICD-10-CM | POA: Diagnosis not present

## 2021-01-05 DIAGNOSIS — Z992 Dependence on renal dialysis: Secondary | ICD-10-CM | POA: Diagnosis not present

## 2021-01-06 DIAGNOSIS — N2589 Other disorders resulting from impaired renal tubular function: Secondary | ICD-10-CM | POA: Diagnosis not present

## 2021-01-06 DIAGNOSIS — E1122 Type 2 diabetes mellitus with diabetic chronic kidney disease: Secondary | ICD-10-CM | POA: Diagnosis not present

## 2021-01-06 DIAGNOSIS — D509 Iron deficiency anemia, unspecified: Secondary | ICD-10-CM | POA: Diagnosis not present

## 2021-01-06 DIAGNOSIS — N2581 Secondary hyperparathyroidism of renal origin: Secondary | ICD-10-CM | POA: Diagnosis not present

## 2021-01-06 DIAGNOSIS — N186 End stage renal disease: Secondary | ICD-10-CM | POA: Diagnosis not present

## 2021-01-06 DIAGNOSIS — Z992 Dependence on renal dialysis: Secondary | ICD-10-CM | POA: Diagnosis not present

## 2021-01-06 DIAGNOSIS — D631 Anemia in chronic kidney disease: Secondary | ICD-10-CM | POA: Diagnosis not present

## 2021-01-07 DIAGNOSIS — E1122 Type 2 diabetes mellitus with diabetic chronic kidney disease: Secondary | ICD-10-CM | POA: Diagnosis not present

## 2021-01-07 DIAGNOSIS — N2589 Other disorders resulting from impaired renal tubular function: Secondary | ICD-10-CM | POA: Diagnosis not present

## 2021-01-07 DIAGNOSIS — D509 Iron deficiency anemia, unspecified: Secondary | ICD-10-CM | POA: Diagnosis not present

## 2021-01-07 DIAGNOSIS — D631 Anemia in chronic kidney disease: Secondary | ICD-10-CM | POA: Diagnosis not present

## 2021-01-07 DIAGNOSIS — N2581 Secondary hyperparathyroidism of renal origin: Secondary | ICD-10-CM | POA: Diagnosis not present

## 2021-01-07 DIAGNOSIS — Z992 Dependence on renal dialysis: Secondary | ICD-10-CM | POA: Diagnosis not present

## 2021-01-07 DIAGNOSIS — N186 End stage renal disease: Secondary | ICD-10-CM | POA: Diagnosis not present

## 2021-01-08 DIAGNOSIS — D631 Anemia in chronic kidney disease: Secondary | ICD-10-CM | POA: Diagnosis not present

## 2021-01-08 DIAGNOSIS — E1122 Type 2 diabetes mellitus with diabetic chronic kidney disease: Secondary | ICD-10-CM | POA: Diagnosis not present

## 2021-01-08 DIAGNOSIS — N186 End stage renal disease: Secondary | ICD-10-CM | POA: Diagnosis not present

## 2021-01-08 DIAGNOSIS — N2589 Other disorders resulting from impaired renal tubular function: Secondary | ICD-10-CM | POA: Diagnosis not present

## 2021-01-08 DIAGNOSIS — Z992 Dependence on renal dialysis: Secondary | ICD-10-CM | POA: Diagnosis not present

## 2021-01-08 DIAGNOSIS — N2581 Secondary hyperparathyroidism of renal origin: Secondary | ICD-10-CM | POA: Diagnosis not present

## 2021-01-08 DIAGNOSIS — D509 Iron deficiency anemia, unspecified: Secondary | ICD-10-CM | POA: Diagnosis not present

## 2021-01-09 DIAGNOSIS — N2581 Secondary hyperparathyroidism of renal origin: Secondary | ICD-10-CM | POA: Diagnosis not present

## 2021-01-09 DIAGNOSIS — N186 End stage renal disease: Secondary | ICD-10-CM | POA: Diagnosis not present

## 2021-01-09 DIAGNOSIS — E1122 Type 2 diabetes mellitus with diabetic chronic kidney disease: Secondary | ICD-10-CM | POA: Diagnosis not present

## 2021-01-09 DIAGNOSIS — Z992 Dependence on renal dialysis: Secondary | ICD-10-CM | POA: Diagnosis not present

## 2021-01-09 DIAGNOSIS — N2589 Other disorders resulting from impaired renal tubular function: Secondary | ICD-10-CM | POA: Diagnosis not present

## 2021-01-09 DIAGNOSIS — D509 Iron deficiency anemia, unspecified: Secondary | ICD-10-CM | POA: Diagnosis not present

## 2021-01-09 DIAGNOSIS — D631 Anemia in chronic kidney disease: Secondary | ICD-10-CM | POA: Diagnosis not present

## 2021-01-10 DIAGNOSIS — D631 Anemia in chronic kidney disease: Secondary | ICD-10-CM | POA: Diagnosis not present

## 2021-01-10 DIAGNOSIS — Z992 Dependence on renal dialysis: Secondary | ICD-10-CM | POA: Diagnosis not present

## 2021-01-10 DIAGNOSIS — E1122 Type 2 diabetes mellitus with diabetic chronic kidney disease: Secondary | ICD-10-CM | POA: Diagnosis not present

## 2021-01-10 DIAGNOSIS — D509 Iron deficiency anemia, unspecified: Secondary | ICD-10-CM | POA: Diagnosis not present

## 2021-01-10 DIAGNOSIS — N2589 Other disorders resulting from impaired renal tubular function: Secondary | ICD-10-CM | POA: Diagnosis not present

## 2021-01-10 DIAGNOSIS — N2581 Secondary hyperparathyroidism of renal origin: Secondary | ICD-10-CM | POA: Diagnosis not present

## 2021-01-10 DIAGNOSIS — N186 End stage renal disease: Secondary | ICD-10-CM | POA: Diagnosis not present

## 2021-01-11 DIAGNOSIS — D509 Iron deficiency anemia, unspecified: Secondary | ICD-10-CM | POA: Diagnosis not present

## 2021-01-11 DIAGNOSIS — N2589 Other disorders resulting from impaired renal tubular function: Secondary | ICD-10-CM | POA: Diagnosis not present

## 2021-01-11 DIAGNOSIS — N2581 Secondary hyperparathyroidism of renal origin: Secondary | ICD-10-CM | POA: Diagnosis not present

## 2021-01-11 DIAGNOSIS — Z992 Dependence on renal dialysis: Secondary | ICD-10-CM | POA: Diagnosis not present

## 2021-01-11 DIAGNOSIS — N186 End stage renal disease: Secondary | ICD-10-CM | POA: Diagnosis not present

## 2021-01-11 DIAGNOSIS — D631 Anemia in chronic kidney disease: Secondary | ICD-10-CM | POA: Diagnosis not present

## 2021-01-11 DIAGNOSIS — E1122 Type 2 diabetes mellitus with diabetic chronic kidney disease: Secondary | ICD-10-CM | POA: Diagnosis not present

## 2021-01-12 DIAGNOSIS — E1122 Type 2 diabetes mellitus with diabetic chronic kidney disease: Secondary | ICD-10-CM | POA: Diagnosis not present

## 2021-01-12 DIAGNOSIS — Z992 Dependence on renal dialysis: Secondary | ICD-10-CM | POA: Diagnosis not present

## 2021-01-12 DIAGNOSIS — D509 Iron deficiency anemia, unspecified: Secondary | ICD-10-CM | POA: Diagnosis not present

## 2021-01-12 DIAGNOSIS — N2589 Other disorders resulting from impaired renal tubular function: Secondary | ICD-10-CM | POA: Diagnosis not present

## 2021-01-12 DIAGNOSIS — N2581 Secondary hyperparathyroidism of renal origin: Secondary | ICD-10-CM | POA: Diagnosis not present

## 2021-01-12 DIAGNOSIS — D631 Anemia in chronic kidney disease: Secondary | ICD-10-CM | POA: Diagnosis not present

## 2021-01-12 DIAGNOSIS — N186 End stage renal disease: Secondary | ICD-10-CM | POA: Diagnosis not present

## 2021-01-13 DIAGNOSIS — E1122 Type 2 diabetes mellitus with diabetic chronic kidney disease: Secondary | ICD-10-CM | POA: Diagnosis not present

## 2021-01-13 DIAGNOSIS — N2581 Secondary hyperparathyroidism of renal origin: Secondary | ICD-10-CM | POA: Diagnosis not present

## 2021-01-13 DIAGNOSIS — D631 Anemia in chronic kidney disease: Secondary | ICD-10-CM | POA: Diagnosis not present

## 2021-01-13 DIAGNOSIS — Z992 Dependence on renal dialysis: Secondary | ICD-10-CM | POA: Diagnosis not present

## 2021-01-13 DIAGNOSIS — D509 Iron deficiency anemia, unspecified: Secondary | ICD-10-CM | POA: Diagnosis not present

## 2021-01-13 DIAGNOSIS — N2589 Other disorders resulting from impaired renal tubular function: Secondary | ICD-10-CM | POA: Diagnosis not present

## 2021-01-13 DIAGNOSIS — N186 End stage renal disease: Secondary | ICD-10-CM | POA: Diagnosis not present

## 2021-01-14 DIAGNOSIS — N186 End stage renal disease: Secondary | ICD-10-CM | POA: Diagnosis not present

## 2021-01-14 DIAGNOSIS — D509 Iron deficiency anemia, unspecified: Secondary | ICD-10-CM | POA: Diagnosis not present

## 2021-01-14 DIAGNOSIS — N2581 Secondary hyperparathyroidism of renal origin: Secondary | ICD-10-CM | POA: Diagnosis not present

## 2021-01-14 DIAGNOSIS — Z992 Dependence on renal dialysis: Secondary | ICD-10-CM | POA: Diagnosis not present

## 2021-01-14 DIAGNOSIS — E1122 Type 2 diabetes mellitus with diabetic chronic kidney disease: Secondary | ICD-10-CM | POA: Diagnosis not present

## 2021-01-14 DIAGNOSIS — D631 Anemia in chronic kidney disease: Secondary | ICD-10-CM | POA: Diagnosis not present

## 2021-01-14 DIAGNOSIS — N2589 Other disorders resulting from impaired renal tubular function: Secondary | ICD-10-CM | POA: Diagnosis not present

## 2021-01-15 DIAGNOSIS — N2589 Other disorders resulting from impaired renal tubular function: Secondary | ICD-10-CM | POA: Diagnosis not present

## 2021-01-15 DIAGNOSIS — D509 Iron deficiency anemia, unspecified: Secondary | ICD-10-CM | POA: Diagnosis not present

## 2021-01-15 DIAGNOSIS — E1122 Type 2 diabetes mellitus with diabetic chronic kidney disease: Secondary | ICD-10-CM | POA: Diagnosis not present

## 2021-01-15 DIAGNOSIS — N2581 Secondary hyperparathyroidism of renal origin: Secondary | ICD-10-CM | POA: Diagnosis not present

## 2021-01-15 DIAGNOSIS — Z992 Dependence on renal dialysis: Secondary | ICD-10-CM | POA: Diagnosis not present

## 2021-01-15 DIAGNOSIS — N186 End stage renal disease: Secondary | ICD-10-CM | POA: Diagnosis not present

## 2021-01-15 DIAGNOSIS — D631 Anemia in chronic kidney disease: Secondary | ICD-10-CM | POA: Diagnosis not present

## 2021-01-16 DIAGNOSIS — D631 Anemia in chronic kidney disease: Secondary | ICD-10-CM | POA: Diagnosis not present

## 2021-01-16 DIAGNOSIS — N2581 Secondary hyperparathyroidism of renal origin: Secondary | ICD-10-CM | POA: Diagnosis not present

## 2021-01-16 DIAGNOSIS — Z992 Dependence on renal dialysis: Secondary | ICD-10-CM | POA: Diagnosis not present

## 2021-01-16 DIAGNOSIS — E1122 Type 2 diabetes mellitus with diabetic chronic kidney disease: Secondary | ICD-10-CM | POA: Diagnosis not present

## 2021-01-16 DIAGNOSIS — N2589 Other disorders resulting from impaired renal tubular function: Secondary | ICD-10-CM | POA: Diagnosis not present

## 2021-01-16 DIAGNOSIS — N186 End stage renal disease: Secondary | ICD-10-CM | POA: Diagnosis not present

## 2021-01-16 DIAGNOSIS — D509 Iron deficiency anemia, unspecified: Secondary | ICD-10-CM | POA: Diagnosis not present

## 2021-01-17 DIAGNOSIS — N186 End stage renal disease: Secondary | ICD-10-CM | POA: Diagnosis not present

## 2021-01-17 DIAGNOSIS — E1122 Type 2 diabetes mellitus with diabetic chronic kidney disease: Secondary | ICD-10-CM | POA: Diagnosis not present

## 2021-01-17 DIAGNOSIS — N2589 Other disorders resulting from impaired renal tubular function: Secondary | ICD-10-CM | POA: Diagnosis not present

## 2021-01-17 DIAGNOSIS — N2581 Secondary hyperparathyroidism of renal origin: Secondary | ICD-10-CM | POA: Diagnosis not present

## 2021-01-17 DIAGNOSIS — Z992 Dependence on renal dialysis: Secondary | ICD-10-CM | POA: Diagnosis not present

## 2021-01-17 DIAGNOSIS — D509 Iron deficiency anemia, unspecified: Secondary | ICD-10-CM | POA: Diagnosis not present

## 2021-01-17 DIAGNOSIS — D631 Anemia in chronic kidney disease: Secondary | ICD-10-CM | POA: Diagnosis not present

## 2021-01-18 DIAGNOSIS — D509 Iron deficiency anemia, unspecified: Secondary | ICD-10-CM | POA: Diagnosis not present

## 2021-01-18 DIAGNOSIS — E1122 Type 2 diabetes mellitus with diabetic chronic kidney disease: Secondary | ICD-10-CM | POA: Diagnosis not present

## 2021-01-18 DIAGNOSIS — Z992 Dependence on renal dialysis: Secondary | ICD-10-CM | POA: Diagnosis not present

## 2021-01-18 DIAGNOSIS — N186 End stage renal disease: Secondary | ICD-10-CM | POA: Diagnosis not present

## 2021-01-18 DIAGNOSIS — D631 Anemia in chronic kidney disease: Secondary | ICD-10-CM | POA: Diagnosis not present

## 2021-01-18 DIAGNOSIS — N2589 Other disorders resulting from impaired renal tubular function: Secondary | ICD-10-CM | POA: Diagnosis not present

## 2021-01-18 DIAGNOSIS — N2581 Secondary hyperparathyroidism of renal origin: Secondary | ICD-10-CM | POA: Diagnosis not present

## 2021-01-19 DIAGNOSIS — N186 End stage renal disease: Secondary | ICD-10-CM | POA: Diagnosis not present

## 2021-01-19 DIAGNOSIS — D509 Iron deficiency anemia, unspecified: Secondary | ICD-10-CM | POA: Diagnosis not present

## 2021-01-19 DIAGNOSIS — E1122 Type 2 diabetes mellitus with diabetic chronic kidney disease: Secondary | ICD-10-CM | POA: Diagnosis not present

## 2021-01-19 DIAGNOSIS — N2589 Other disorders resulting from impaired renal tubular function: Secondary | ICD-10-CM | POA: Diagnosis not present

## 2021-01-19 DIAGNOSIS — Z992 Dependence on renal dialysis: Secondary | ICD-10-CM | POA: Diagnosis not present

## 2021-01-19 DIAGNOSIS — N2581 Secondary hyperparathyroidism of renal origin: Secondary | ICD-10-CM | POA: Diagnosis not present

## 2021-01-19 DIAGNOSIS — D631 Anemia in chronic kidney disease: Secondary | ICD-10-CM | POA: Diagnosis not present

## 2021-01-20 DIAGNOSIS — D631 Anemia in chronic kidney disease: Secondary | ICD-10-CM | POA: Diagnosis not present

## 2021-01-20 DIAGNOSIS — D509 Iron deficiency anemia, unspecified: Secondary | ICD-10-CM | POA: Diagnosis not present

## 2021-01-20 DIAGNOSIS — N186 End stage renal disease: Secondary | ICD-10-CM | POA: Diagnosis not present

## 2021-01-20 DIAGNOSIS — E1122 Type 2 diabetes mellitus with diabetic chronic kidney disease: Secondary | ICD-10-CM | POA: Diagnosis not present

## 2021-01-20 DIAGNOSIS — N2581 Secondary hyperparathyroidism of renal origin: Secondary | ICD-10-CM | POA: Diagnosis not present

## 2021-01-20 DIAGNOSIS — Z992 Dependence on renal dialysis: Secondary | ICD-10-CM | POA: Diagnosis not present

## 2021-01-20 DIAGNOSIS — L97511 Non-pressure chronic ulcer of other part of right foot limited to breakdown of skin: Secondary | ICD-10-CM | POA: Diagnosis not present

## 2021-01-20 DIAGNOSIS — N2589 Other disorders resulting from impaired renal tubular function: Secondary | ICD-10-CM | POA: Diagnosis not present

## 2021-01-20 DIAGNOSIS — E11621 Type 2 diabetes mellitus with foot ulcer: Secondary | ICD-10-CM | POA: Diagnosis not present

## 2021-01-20 DIAGNOSIS — Z794 Long term (current) use of insulin: Secondary | ICD-10-CM | POA: Diagnosis not present

## 2021-01-20 DIAGNOSIS — Z89422 Acquired absence of other left toe(s): Secondary | ICD-10-CM | POA: Diagnosis not present

## 2021-01-20 DIAGNOSIS — L97509 Non-pressure chronic ulcer of other part of unspecified foot with unspecified severity: Secondary | ICD-10-CM | POA: Diagnosis not present

## 2021-01-20 DIAGNOSIS — L97522 Non-pressure chronic ulcer of other part of left foot with fat layer exposed: Secondary | ICD-10-CM | POA: Diagnosis not present

## 2021-01-21 DIAGNOSIS — N2581 Secondary hyperparathyroidism of renal origin: Secondary | ICD-10-CM | POA: Diagnosis not present

## 2021-01-21 DIAGNOSIS — E1122 Type 2 diabetes mellitus with diabetic chronic kidney disease: Secondary | ICD-10-CM | POA: Diagnosis not present

## 2021-01-21 DIAGNOSIS — N2589 Other disorders resulting from impaired renal tubular function: Secondary | ICD-10-CM | POA: Diagnosis not present

## 2021-01-21 DIAGNOSIS — D631 Anemia in chronic kidney disease: Secondary | ICD-10-CM | POA: Diagnosis not present

## 2021-01-21 DIAGNOSIS — D509 Iron deficiency anemia, unspecified: Secondary | ICD-10-CM | POA: Diagnosis not present

## 2021-01-21 DIAGNOSIS — N186 End stage renal disease: Secondary | ICD-10-CM | POA: Diagnosis not present

## 2021-01-21 DIAGNOSIS — Z992 Dependence on renal dialysis: Secondary | ICD-10-CM | POA: Diagnosis not present

## 2021-01-22 DIAGNOSIS — N2589 Other disorders resulting from impaired renal tubular function: Secondary | ICD-10-CM | POA: Diagnosis not present

## 2021-01-22 DIAGNOSIS — Z992 Dependence on renal dialysis: Secondary | ICD-10-CM | POA: Diagnosis not present

## 2021-01-22 DIAGNOSIS — N186 End stage renal disease: Secondary | ICD-10-CM | POA: Diagnosis not present

## 2021-01-22 DIAGNOSIS — D631 Anemia in chronic kidney disease: Secondary | ICD-10-CM | POA: Diagnosis not present

## 2021-01-22 DIAGNOSIS — E1122 Type 2 diabetes mellitus with diabetic chronic kidney disease: Secondary | ICD-10-CM | POA: Diagnosis not present

## 2021-01-22 DIAGNOSIS — D509 Iron deficiency anemia, unspecified: Secondary | ICD-10-CM | POA: Diagnosis not present

## 2021-01-22 DIAGNOSIS — N2581 Secondary hyperparathyroidism of renal origin: Secondary | ICD-10-CM | POA: Diagnosis not present

## 2021-01-23 DIAGNOSIS — E1122 Type 2 diabetes mellitus with diabetic chronic kidney disease: Secondary | ICD-10-CM | POA: Diagnosis not present

## 2021-01-23 DIAGNOSIS — N2589 Other disorders resulting from impaired renal tubular function: Secondary | ICD-10-CM | POA: Diagnosis not present

## 2021-01-23 DIAGNOSIS — D509 Iron deficiency anemia, unspecified: Secondary | ICD-10-CM | POA: Diagnosis not present

## 2021-01-23 DIAGNOSIS — Z992 Dependence on renal dialysis: Secondary | ICD-10-CM | POA: Diagnosis not present

## 2021-01-23 DIAGNOSIS — N186 End stage renal disease: Secondary | ICD-10-CM | POA: Diagnosis not present

## 2021-01-23 DIAGNOSIS — N2581 Secondary hyperparathyroidism of renal origin: Secondary | ICD-10-CM | POA: Diagnosis not present

## 2021-01-23 DIAGNOSIS — D631 Anemia in chronic kidney disease: Secondary | ICD-10-CM | POA: Diagnosis not present

## 2021-01-24 DIAGNOSIS — N2589 Other disorders resulting from impaired renal tubular function: Secondary | ICD-10-CM | POA: Diagnosis not present

## 2021-01-24 DIAGNOSIS — N186 End stage renal disease: Secondary | ICD-10-CM | POA: Diagnosis not present

## 2021-01-24 DIAGNOSIS — D631 Anemia in chronic kidney disease: Secondary | ICD-10-CM | POA: Diagnosis not present

## 2021-01-24 DIAGNOSIS — Z992 Dependence on renal dialysis: Secondary | ICD-10-CM | POA: Diagnosis not present

## 2021-01-24 DIAGNOSIS — N2581 Secondary hyperparathyroidism of renal origin: Secondary | ICD-10-CM | POA: Diagnosis not present

## 2021-01-24 DIAGNOSIS — E1122 Type 2 diabetes mellitus with diabetic chronic kidney disease: Secondary | ICD-10-CM | POA: Diagnosis not present

## 2021-01-24 DIAGNOSIS — D509 Iron deficiency anemia, unspecified: Secondary | ICD-10-CM | POA: Diagnosis not present

## 2021-01-25 DIAGNOSIS — N2589 Other disorders resulting from impaired renal tubular function: Secondary | ICD-10-CM | POA: Diagnosis not present

## 2021-01-25 DIAGNOSIS — D631 Anemia in chronic kidney disease: Secondary | ICD-10-CM | POA: Diagnosis not present

## 2021-01-25 DIAGNOSIS — D509 Iron deficiency anemia, unspecified: Secondary | ICD-10-CM | POA: Diagnosis not present

## 2021-01-25 DIAGNOSIS — Z992 Dependence on renal dialysis: Secondary | ICD-10-CM | POA: Diagnosis not present

## 2021-01-25 DIAGNOSIS — N186 End stage renal disease: Secondary | ICD-10-CM | POA: Diagnosis not present

## 2021-01-25 DIAGNOSIS — E1122 Type 2 diabetes mellitus with diabetic chronic kidney disease: Secondary | ICD-10-CM | POA: Diagnosis not present

## 2021-01-25 DIAGNOSIS — N2581 Secondary hyperparathyroidism of renal origin: Secondary | ICD-10-CM | POA: Diagnosis not present

## 2021-01-26 DIAGNOSIS — N2581 Secondary hyperparathyroidism of renal origin: Secondary | ICD-10-CM | POA: Diagnosis not present

## 2021-01-26 DIAGNOSIS — D509 Iron deficiency anemia, unspecified: Secondary | ICD-10-CM | POA: Diagnosis not present

## 2021-01-26 DIAGNOSIS — N2589 Other disorders resulting from impaired renal tubular function: Secondary | ICD-10-CM | POA: Diagnosis not present

## 2021-01-26 DIAGNOSIS — D631 Anemia in chronic kidney disease: Secondary | ICD-10-CM | POA: Diagnosis not present

## 2021-01-26 DIAGNOSIS — E1122 Type 2 diabetes mellitus with diabetic chronic kidney disease: Secondary | ICD-10-CM | POA: Diagnosis not present

## 2021-01-26 DIAGNOSIS — Z992 Dependence on renal dialysis: Secondary | ICD-10-CM | POA: Diagnosis not present

## 2021-01-26 DIAGNOSIS — N186 End stage renal disease: Secondary | ICD-10-CM | POA: Diagnosis not present

## 2021-01-27 DIAGNOSIS — E1122 Type 2 diabetes mellitus with diabetic chronic kidney disease: Secondary | ICD-10-CM | POA: Diagnosis not present

## 2021-01-27 DIAGNOSIS — N186 End stage renal disease: Secondary | ICD-10-CM | POA: Diagnosis not present

## 2021-01-27 DIAGNOSIS — Z992 Dependence on renal dialysis: Secondary | ICD-10-CM | POA: Diagnosis not present

## 2021-01-27 DIAGNOSIS — N2589 Other disorders resulting from impaired renal tubular function: Secondary | ICD-10-CM | POA: Diagnosis not present

## 2021-01-27 DIAGNOSIS — D509 Iron deficiency anemia, unspecified: Secondary | ICD-10-CM | POA: Diagnosis not present

## 2021-01-27 DIAGNOSIS — N2581 Secondary hyperparathyroidism of renal origin: Secondary | ICD-10-CM | POA: Diagnosis not present

## 2021-01-27 DIAGNOSIS — D631 Anemia in chronic kidney disease: Secondary | ICD-10-CM | POA: Diagnosis not present

## 2021-01-28 DIAGNOSIS — N2581 Secondary hyperparathyroidism of renal origin: Secondary | ICD-10-CM | POA: Diagnosis not present

## 2021-01-28 DIAGNOSIS — N2589 Other disorders resulting from impaired renal tubular function: Secondary | ICD-10-CM | POA: Diagnosis not present

## 2021-01-28 DIAGNOSIS — D509 Iron deficiency anemia, unspecified: Secondary | ICD-10-CM | POA: Diagnosis not present

## 2021-01-28 DIAGNOSIS — Z992 Dependence on renal dialysis: Secondary | ICD-10-CM | POA: Diagnosis not present

## 2021-01-28 DIAGNOSIS — N186 End stage renal disease: Secondary | ICD-10-CM | POA: Diagnosis not present

## 2021-01-28 DIAGNOSIS — D631 Anemia in chronic kidney disease: Secondary | ICD-10-CM | POA: Diagnosis not present

## 2021-01-28 DIAGNOSIS — E1122 Type 2 diabetes mellitus with diabetic chronic kidney disease: Secondary | ICD-10-CM | POA: Diagnosis not present

## 2021-01-29 DIAGNOSIS — E1122 Type 2 diabetes mellitus with diabetic chronic kidney disease: Secondary | ICD-10-CM | POA: Diagnosis not present

## 2021-01-29 DIAGNOSIS — Z992 Dependence on renal dialysis: Secondary | ICD-10-CM | POA: Diagnosis not present

## 2021-01-29 DIAGNOSIS — D509 Iron deficiency anemia, unspecified: Secondary | ICD-10-CM | POA: Diagnosis not present

## 2021-01-29 DIAGNOSIS — N186 End stage renal disease: Secondary | ICD-10-CM | POA: Diagnosis not present

## 2021-01-29 DIAGNOSIS — N2581 Secondary hyperparathyroidism of renal origin: Secondary | ICD-10-CM | POA: Diagnosis not present

## 2021-01-29 DIAGNOSIS — D631 Anemia in chronic kidney disease: Secondary | ICD-10-CM | POA: Diagnosis not present

## 2021-01-29 DIAGNOSIS — N2589 Other disorders resulting from impaired renal tubular function: Secondary | ICD-10-CM | POA: Diagnosis not present

## 2021-01-30 DIAGNOSIS — N2589 Other disorders resulting from impaired renal tubular function: Secondary | ICD-10-CM | POA: Diagnosis not present

## 2021-01-30 DIAGNOSIS — E1122 Type 2 diabetes mellitus with diabetic chronic kidney disease: Secondary | ICD-10-CM | POA: Diagnosis not present

## 2021-01-30 DIAGNOSIS — D509 Iron deficiency anemia, unspecified: Secondary | ICD-10-CM | POA: Diagnosis not present

## 2021-01-30 DIAGNOSIS — N186 End stage renal disease: Secondary | ICD-10-CM | POA: Diagnosis not present

## 2021-01-30 DIAGNOSIS — N2581 Secondary hyperparathyroidism of renal origin: Secondary | ICD-10-CM | POA: Diagnosis not present

## 2021-01-30 DIAGNOSIS — D631 Anemia in chronic kidney disease: Secondary | ICD-10-CM | POA: Diagnosis not present

## 2021-01-30 DIAGNOSIS — Z992 Dependence on renal dialysis: Secondary | ICD-10-CM | POA: Diagnosis not present

## 2021-01-31 DIAGNOSIS — Z992 Dependence on renal dialysis: Secondary | ICD-10-CM | POA: Diagnosis not present

## 2021-01-31 DIAGNOSIS — N2589 Other disorders resulting from impaired renal tubular function: Secondary | ICD-10-CM | POA: Diagnosis not present

## 2021-01-31 DIAGNOSIS — E1122 Type 2 diabetes mellitus with diabetic chronic kidney disease: Secondary | ICD-10-CM | POA: Diagnosis not present

## 2021-01-31 DIAGNOSIS — N2581 Secondary hyperparathyroidism of renal origin: Secondary | ICD-10-CM | POA: Diagnosis not present

## 2021-01-31 DIAGNOSIS — D631 Anemia in chronic kidney disease: Secondary | ICD-10-CM | POA: Diagnosis not present

## 2021-01-31 DIAGNOSIS — N186 End stage renal disease: Secondary | ICD-10-CM | POA: Diagnosis not present

## 2021-01-31 DIAGNOSIS — D509 Iron deficiency anemia, unspecified: Secondary | ICD-10-CM | POA: Diagnosis not present

## 2021-02-01 DIAGNOSIS — D509 Iron deficiency anemia, unspecified: Secondary | ICD-10-CM | POA: Diagnosis not present

## 2021-02-01 DIAGNOSIS — N186 End stage renal disease: Secondary | ICD-10-CM | POA: Diagnosis not present

## 2021-02-01 DIAGNOSIS — N2581 Secondary hyperparathyroidism of renal origin: Secondary | ICD-10-CM | POA: Diagnosis not present

## 2021-02-01 DIAGNOSIS — E1122 Type 2 diabetes mellitus with diabetic chronic kidney disease: Secondary | ICD-10-CM | POA: Diagnosis not present

## 2021-02-01 DIAGNOSIS — D631 Anemia in chronic kidney disease: Secondary | ICD-10-CM | POA: Diagnosis not present

## 2021-02-01 DIAGNOSIS — Z992 Dependence on renal dialysis: Secondary | ICD-10-CM | POA: Diagnosis not present

## 2021-02-01 DIAGNOSIS — N2589 Other disorders resulting from impaired renal tubular function: Secondary | ICD-10-CM | POA: Diagnosis not present

## 2021-02-02 DIAGNOSIS — E1122 Type 2 diabetes mellitus with diabetic chronic kidney disease: Secondary | ICD-10-CM | POA: Diagnosis not present

## 2021-02-02 DIAGNOSIS — D631 Anemia in chronic kidney disease: Secondary | ICD-10-CM | POA: Diagnosis not present

## 2021-02-02 DIAGNOSIS — Z992 Dependence on renal dialysis: Secondary | ICD-10-CM | POA: Diagnosis not present

## 2021-02-02 DIAGNOSIS — N186 End stage renal disease: Secondary | ICD-10-CM | POA: Diagnosis not present

## 2021-02-02 DIAGNOSIS — D509 Iron deficiency anemia, unspecified: Secondary | ICD-10-CM | POA: Diagnosis not present

## 2021-02-02 DIAGNOSIS — N2581 Secondary hyperparathyroidism of renal origin: Secondary | ICD-10-CM | POA: Diagnosis not present

## 2021-02-02 DIAGNOSIS — N2589 Other disorders resulting from impaired renal tubular function: Secondary | ICD-10-CM | POA: Diagnosis not present

## 2021-02-03 DIAGNOSIS — Z992 Dependence on renal dialysis: Secondary | ICD-10-CM | POA: Diagnosis not present

## 2021-02-03 DIAGNOSIS — E1042 Type 1 diabetes mellitus with diabetic polyneuropathy: Secondary | ICD-10-CM | POA: Diagnosis not present

## 2021-02-03 DIAGNOSIS — N2581 Secondary hyperparathyroidism of renal origin: Secondary | ICD-10-CM | POA: Diagnosis not present

## 2021-02-03 DIAGNOSIS — K089 Disorder of teeth and supporting structures, unspecified: Secondary | ICD-10-CM | POA: Diagnosis not present

## 2021-02-03 DIAGNOSIS — D509 Iron deficiency anemia, unspecified: Secondary | ICD-10-CM | POA: Diagnosis not present

## 2021-02-03 DIAGNOSIS — E10621 Type 1 diabetes mellitus with foot ulcer: Secondary | ICD-10-CM | POA: Diagnosis not present

## 2021-02-03 DIAGNOSIS — E1022 Type 1 diabetes mellitus with diabetic chronic kidney disease: Secondary | ICD-10-CM | POA: Diagnosis not present

## 2021-02-03 DIAGNOSIS — D631 Anemia in chronic kidney disease: Secondary | ICD-10-CM | POA: Diagnosis not present

## 2021-02-03 DIAGNOSIS — E11628 Type 2 diabetes mellitus with other skin complications: Secondary | ICD-10-CM | POA: Diagnosis not present

## 2021-02-03 DIAGNOSIS — L97529 Non-pressure chronic ulcer of other part of left foot with unspecified severity: Secondary | ICD-10-CM | POA: Diagnosis not present

## 2021-02-03 DIAGNOSIS — N186 End stage renal disease: Secondary | ICD-10-CM | POA: Diagnosis not present

## 2021-02-03 DIAGNOSIS — N2589 Other disorders resulting from impaired renal tubular function: Secondary | ICD-10-CM | POA: Diagnosis not present

## 2021-02-03 DIAGNOSIS — L089 Local infection of the skin and subcutaneous tissue, unspecified: Secondary | ICD-10-CM | POA: Diagnosis not present

## 2021-02-03 DIAGNOSIS — I120 Hypertensive chronic kidney disease with stage 5 chronic kidney disease or end stage renal disease: Secondary | ICD-10-CM | POA: Diagnosis not present

## 2021-02-03 DIAGNOSIS — E1122 Type 2 diabetes mellitus with diabetic chronic kidney disease: Secondary | ICD-10-CM | POA: Diagnosis not present

## 2021-02-04 DIAGNOSIS — N2589 Other disorders resulting from impaired renal tubular function: Secondary | ICD-10-CM | POA: Diagnosis not present

## 2021-02-04 DIAGNOSIS — D509 Iron deficiency anemia, unspecified: Secondary | ICD-10-CM | POA: Diagnosis not present

## 2021-02-04 DIAGNOSIS — N186 End stage renal disease: Secondary | ICD-10-CM | POA: Diagnosis not present

## 2021-02-04 DIAGNOSIS — N2581 Secondary hyperparathyroidism of renal origin: Secondary | ICD-10-CM | POA: Diagnosis not present

## 2021-02-04 DIAGNOSIS — D631 Anemia in chronic kidney disease: Secondary | ICD-10-CM | POA: Diagnosis not present

## 2021-02-04 DIAGNOSIS — E1122 Type 2 diabetes mellitus with diabetic chronic kidney disease: Secondary | ICD-10-CM | POA: Diagnosis not present

## 2021-02-04 DIAGNOSIS — Z992 Dependence on renal dialysis: Secondary | ICD-10-CM | POA: Diagnosis not present

## 2021-02-05 DIAGNOSIS — D631 Anemia in chronic kidney disease: Secondary | ICD-10-CM | POA: Diagnosis not present

## 2021-02-05 DIAGNOSIS — D509 Iron deficiency anemia, unspecified: Secondary | ICD-10-CM | POA: Diagnosis not present

## 2021-02-05 DIAGNOSIS — N186 End stage renal disease: Secondary | ICD-10-CM | POA: Diagnosis not present

## 2021-02-05 DIAGNOSIS — N2581 Secondary hyperparathyroidism of renal origin: Secondary | ICD-10-CM | POA: Diagnosis not present

## 2021-02-05 DIAGNOSIS — N2589 Other disorders resulting from impaired renal tubular function: Secondary | ICD-10-CM | POA: Diagnosis not present

## 2021-02-05 DIAGNOSIS — Z992 Dependence on renal dialysis: Secondary | ICD-10-CM | POA: Diagnosis not present

## 2021-02-05 DIAGNOSIS — E1122 Type 2 diabetes mellitus with diabetic chronic kidney disease: Secondary | ICD-10-CM | POA: Diagnosis not present

## 2021-02-06 DIAGNOSIS — E440 Moderate protein-calorie malnutrition: Secondary | ICD-10-CM | POA: Diagnosis not present

## 2021-02-06 DIAGNOSIS — D631 Anemia in chronic kidney disease: Secondary | ICD-10-CM | POA: Diagnosis not present

## 2021-02-06 DIAGNOSIS — D509 Iron deficiency anemia, unspecified: Secondary | ICD-10-CM | POA: Diagnosis not present

## 2021-02-06 DIAGNOSIS — N2581 Secondary hyperparathyroidism of renal origin: Secondary | ICD-10-CM | POA: Diagnosis not present

## 2021-02-06 DIAGNOSIS — R17 Unspecified jaundice: Secondary | ICD-10-CM | POA: Diagnosis not present

## 2021-02-06 DIAGNOSIS — Z79899 Other long term (current) drug therapy: Secondary | ICD-10-CM | POA: Diagnosis not present

## 2021-02-06 DIAGNOSIS — Z992 Dependence on renal dialysis: Secondary | ICD-10-CM | POA: Diagnosis not present

## 2021-02-06 DIAGNOSIS — N186 End stage renal disease: Secondary | ICD-10-CM | POA: Diagnosis not present

## 2021-02-06 DIAGNOSIS — N2589 Other disorders resulting from impaired renal tubular function: Secondary | ICD-10-CM | POA: Diagnosis not present

## 2021-02-07 DIAGNOSIS — D509 Iron deficiency anemia, unspecified: Secondary | ICD-10-CM | POA: Diagnosis not present

## 2021-02-07 DIAGNOSIS — N186 End stage renal disease: Secondary | ICD-10-CM | POA: Diagnosis not present

## 2021-02-07 DIAGNOSIS — N2589 Other disorders resulting from impaired renal tubular function: Secondary | ICD-10-CM | POA: Diagnosis not present

## 2021-02-07 DIAGNOSIS — Z992 Dependence on renal dialysis: Secondary | ICD-10-CM | POA: Diagnosis not present

## 2021-02-07 DIAGNOSIS — Z79899 Other long term (current) drug therapy: Secondary | ICD-10-CM | POA: Diagnosis not present

## 2021-02-07 DIAGNOSIS — D631 Anemia in chronic kidney disease: Secondary | ICD-10-CM | POA: Diagnosis not present

## 2021-02-07 DIAGNOSIS — N2581 Secondary hyperparathyroidism of renal origin: Secondary | ICD-10-CM | POA: Diagnosis not present

## 2021-02-07 DIAGNOSIS — E440 Moderate protein-calorie malnutrition: Secondary | ICD-10-CM | POA: Diagnosis not present

## 2021-02-07 DIAGNOSIS — R17 Unspecified jaundice: Secondary | ICD-10-CM | POA: Diagnosis not present

## 2021-02-08 DIAGNOSIS — N2589 Other disorders resulting from impaired renal tubular function: Secondary | ICD-10-CM | POA: Diagnosis not present

## 2021-02-08 DIAGNOSIS — Z79899 Other long term (current) drug therapy: Secondary | ICD-10-CM | POA: Diagnosis not present

## 2021-02-08 DIAGNOSIS — R17 Unspecified jaundice: Secondary | ICD-10-CM | POA: Diagnosis not present

## 2021-02-08 DIAGNOSIS — Z992 Dependence on renal dialysis: Secondary | ICD-10-CM | POA: Diagnosis not present

## 2021-02-08 DIAGNOSIS — E440 Moderate protein-calorie malnutrition: Secondary | ICD-10-CM | POA: Diagnosis not present

## 2021-02-08 DIAGNOSIS — D631 Anemia in chronic kidney disease: Secondary | ICD-10-CM | POA: Diagnosis not present

## 2021-02-08 DIAGNOSIS — N2581 Secondary hyperparathyroidism of renal origin: Secondary | ICD-10-CM | POA: Diagnosis not present

## 2021-02-08 DIAGNOSIS — D509 Iron deficiency anemia, unspecified: Secondary | ICD-10-CM | POA: Diagnosis not present

## 2021-02-08 DIAGNOSIS — N186 End stage renal disease: Secondary | ICD-10-CM | POA: Diagnosis not present

## 2021-02-09 DIAGNOSIS — N2589 Other disorders resulting from impaired renal tubular function: Secondary | ICD-10-CM | POA: Diagnosis not present

## 2021-02-09 DIAGNOSIS — E440 Moderate protein-calorie malnutrition: Secondary | ICD-10-CM | POA: Diagnosis not present

## 2021-02-09 DIAGNOSIS — R17 Unspecified jaundice: Secondary | ICD-10-CM | POA: Diagnosis not present

## 2021-02-09 DIAGNOSIS — D509 Iron deficiency anemia, unspecified: Secondary | ICD-10-CM | POA: Diagnosis not present

## 2021-02-09 DIAGNOSIS — N2581 Secondary hyperparathyroidism of renal origin: Secondary | ICD-10-CM | POA: Diagnosis not present

## 2021-02-09 DIAGNOSIS — G4733 Obstructive sleep apnea (adult) (pediatric): Secondary | ICD-10-CM | POA: Diagnosis not present

## 2021-02-09 DIAGNOSIS — Z79899 Other long term (current) drug therapy: Secondary | ICD-10-CM | POA: Diagnosis not present

## 2021-02-09 DIAGNOSIS — D631 Anemia in chronic kidney disease: Secondary | ICD-10-CM | POA: Diagnosis not present

## 2021-02-09 DIAGNOSIS — Z992 Dependence on renal dialysis: Secondary | ICD-10-CM | POA: Diagnosis not present

## 2021-02-09 DIAGNOSIS — N186 End stage renal disease: Secondary | ICD-10-CM | POA: Diagnosis not present

## 2021-02-10 DIAGNOSIS — D509 Iron deficiency anemia, unspecified: Secondary | ICD-10-CM | POA: Diagnosis not present

## 2021-02-10 DIAGNOSIS — Z89422 Acquired absence of other left toe(s): Secondary | ICD-10-CM | POA: Diagnosis not present

## 2021-02-10 DIAGNOSIS — N2581 Secondary hyperparathyroidism of renal origin: Secondary | ICD-10-CM | POA: Diagnosis not present

## 2021-02-10 DIAGNOSIS — L97522 Non-pressure chronic ulcer of other part of left foot with fat layer exposed: Secondary | ICD-10-CM | POA: Diagnosis not present

## 2021-02-10 DIAGNOSIS — E440 Moderate protein-calorie malnutrition: Secondary | ICD-10-CM | POA: Diagnosis not present

## 2021-02-10 DIAGNOSIS — Z79899 Other long term (current) drug therapy: Secondary | ICD-10-CM | POA: Diagnosis not present

## 2021-02-10 DIAGNOSIS — Z992 Dependence on renal dialysis: Secondary | ICD-10-CM | POA: Diagnosis not present

## 2021-02-10 DIAGNOSIS — D631 Anemia in chronic kidney disease: Secondary | ICD-10-CM | POA: Diagnosis not present

## 2021-02-10 DIAGNOSIS — R17 Unspecified jaundice: Secondary | ICD-10-CM | POA: Diagnosis not present

## 2021-02-10 DIAGNOSIS — Z794 Long term (current) use of insulin: Secondary | ICD-10-CM | POA: Diagnosis not present

## 2021-02-10 DIAGNOSIS — N186 End stage renal disease: Secondary | ICD-10-CM | POA: Diagnosis not present

## 2021-02-10 DIAGNOSIS — E11621 Type 2 diabetes mellitus with foot ulcer: Secondary | ICD-10-CM | POA: Diagnosis not present

## 2021-02-10 DIAGNOSIS — N2589 Other disorders resulting from impaired renal tubular function: Secondary | ICD-10-CM | POA: Diagnosis not present

## 2021-02-11 DIAGNOSIS — R17 Unspecified jaundice: Secondary | ICD-10-CM | POA: Diagnosis not present

## 2021-02-11 DIAGNOSIS — D631 Anemia in chronic kidney disease: Secondary | ICD-10-CM | POA: Diagnosis not present

## 2021-02-11 DIAGNOSIS — D509 Iron deficiency anemia, unspecified: Secondary | ICD-10-CM | POA: Diagnosis not present

## 2021-02-11 DIAGNOSIS — N186 End stage renal disease: Secondary | ICD-10-CM | POA: Diagnosis not present

## 2021-02-11 DIAGNOSIS — Z79899 Other long term (current) drug therapy: Secondary | ICD-10-CM | POA: Diagnosis not present

## 2021-02-11 DIAGNOSIS — Z992 Dependence on renal dialysis: Secondary | ICD-10-CM | POA: Diagnosis not present

## 2021-02-11 DIAGNOSIS — N2589 Other disorders resulting from impaired renal tubular function: Secondary | ICD-10-CM | POA: Diagnosis not present

## 2021-02-11 DIAGNOSIS — N2581 Secondary hyperparathyroidism of renal origin: Secondary | ICD-10-CM | POA: Diagnosis not present

## 2021-02-11 DIAGNOSIS — E440 Moderate protein-calorie malnutrition: Secondary | ICD-10-CM | POA: Diagnosis not present

## 2021-02-12 DIAGNOSIS — E440 Moderate protein-calorie malnutrition: Secondary | ICD-10-CM | POA: Diagnosis not present

## 2021-02-12 DIAGNOSIS — N2581 Secondary hyperparathyroidism of renal origin: Secondary | ICD-10-CM | POA: Diagnosis not present

## 2021-02-12 DIAGNOSIS — Z992 Dependence on renal dialysis: Secondary | ICD-10-CM | POA: Diagnosis not present

## 2021-02-12 DIAGNOSIS — R17 Unspecified jaundice: Secondary | ICD-10-CM | POA: Diagnosis not present

## 2021-02-12 DIAGNOSIS — Z79899 Other long term (current) drug therapy: Secondary | ICD-10-CM | POA: Diagnosis not present

## 2021-02-12 DIAGNOSIS — D509 Iron deficiency anemia, unspecified: Secondary | ICD-10-CM | POA: Diagnosis not present

## 2021-02-12 DIAGNOSIS — N186 End stage renal disease: Secondary | ICD-10-CM | POA: Diagnosis not present

## 2021-02-12 DIAGNOSIS — N2589 Other disorders resulting from impaired renal tubular function: Secondary | ICD-10-CM | POA: Diagnosis not present

## 2021-02-12 DIAGNOSIS — D631 Anemia in chronic kidney disease: Secondary | ICD-10-CM | POA: Diagnosis not present

## 2021-02-13 DIAGNOSIS — D631 Anemia in chronic kidney disease: Secondary | ICD-10-CM | POA: Diagnosis not present

## 2021-02-13 DIAGNOSIS — Z79899 Other long term (current) drug therapy: Secondary | ICD-10-CM | POA: Diagnosis not present

## 2021-02-13 DIAGNOSIS — E440 Moderate protein-calorie malnutrition: Secondary | ICD-10-CM | POA: Diagnosis not present

## 2021-02-13 DIAGNOSIS — Z992 Dependence on renal dialysis: Secondary | ICD-10-CM | POA: Diagnosis not present

## 2021-02-13 DIAGNOSIS — D509 Iron deficiency anemia, unspecified: Secondary | ICD-10-CM | POA: Diagnosis not present

## 2021-02-13 DIAGNOSIS — N2581 Secondary hyperparathyroidism of renal origin: Secondary | ICD-10-CM | POA: Diagnosis not present

## 2021-02-13 DIAGNOSIS — R17 Unspecified jaundice: Secondary | ICD-10-CM | POA: Diagnosis not present

## 2021-02-13 DIAGNOSIS — N186 End stage renal disease: Secondary | ICD-10-CM | POA: Diagnosis not present

## 2021-02-13 DIAGNOSIS — N2589 Other disorders resulting from impaired renal tubular function: Secondary | ICD-10-CM | POA: Diagnosis not present

## 2021-02-14 DIAGNOSIS — Z79899 Other long term (current) drug therapy: Secondary | ICD-10-CM | POA: Diagnosis not present

## 2021-02-14 DIAGNOSIS — N2581 Secondary hyperparathyroidism of renal origin: Secondary | ICD-10-CM | POA: Diagnosis not present

## 2021-02-14 DIAGNOSIS — E440 Moderate protein-calorie malnutrition: Secondary | ICD-10-CM | POA: Diagnosis not present

## 2021-02-14 DIAGNOSIS — N2589 Other disorders resulting from impaired renal tubular function: Secondary | ICD-10-CM | POA: Diagnosis not present

## 2021-02-14 DIAGNOSIS — D631 Anemia in chronic kidney disease: Secondary | ICD-10-CM | POA: Diagnosis not present

## 2021-02-14 DIAGNOSIS — R17 Unspecified jaundice: Secondary | ICD-10-CM | POA: Diagnosis not present

## 2021-02-14 DIAGNOSIS — D509 Iron deficiency anemia, unspecified: Secondary | ICD-10-CM | POA: Diagnosis not present

## 2021-02-14 DIAGNOSIS — Z992 Dependence on renal dialysis: Secondary | ICD-10-CM | POA: Diagnosis not present

## 2021-02-14 DIAGNOSIS — N186 End stage renal disease: Secondary | ICD-10-CM | POA: Diagnosis not present

## 2021-02-15 DIAGNOSIS — Z79899 Other long term (current) drug therapy: Secondary | ICD-10-CM | POA: Diagnosis not present

## 2021-02-15 DIAGNOSIS — N2581 Secondary hyperparathyroidism of renal origin: Secondary | ICD-10-CM | POA: Diagnosis not present

## 2021-02-15 DIAGNOSIS — Z992 Dependence on renal dialysis: Secondary | ICD-10-CM | POA: Diagnosis not present

## 2021-02-15 DIAGNOSIS — E440 Moderate protein-calorie malnutrition: Secondary | ICD-10-CM | POA: Diagnosis not present

## 2021-02-15 DIAGNOSIS — D509 Iron deficiency anemia, unspecified: Secondary | ICD-10-CM | POA: Diagnosis not present

## 2021-02-15 DIAGNOSIS — N2589 Other disorders resulting from impaired renal tubular function: Secondary | ICD-10-CM | POA: Diagnosis not present

## 2021-02-15 DIAGNOSIS — R17 Unspecified jaundice: Secondary | ICD-10-CM | POA: Diagnosis not present

## 2021-02-15 DIAGNOSIS — N186 End stage renal disease: Secondary | ICD-10-CM | POA: Diagnosis not present

## 2021-02-15 DIAGNOSIS — D631 Anemia in chronic kidney disease: Secondary | ICD-10-CM | POA: Diagnosis not present

## 2021-02-16 DIAGNOSIS — Z992 Dependence on renal dialysis: Secondary | ICD-10-CM | POA: Diagnosis not present

## 2021-02-16 DIAGNOSIS — Z79899 Other long term (current) drug therapy: Secondary | ICD-10-CM | POA: Diagnosis not present

## 2021-02-16 DIAGNOSIS — R17 Unspecified jaundice: Secondary | ICD-10-CM | POA: Diagnosis not present

## 2021-02-16 DIAGNOSIS — N2581 Secondary hyperparathyroidism of renal origin: Secondary | ICD-10-CM | POA: Diagnosis not present

## 2021-02-16 DIAGNOSIS — D509 Iron deficiency anemia, unspecified: Secondary | ICD-10-CM | POA: Diagnosis not present

## 2021-02-16 DIAGNOSIS — N186 End stage renal disease: Secondary | ICD-10-CM | POA: Diagnosis not present

## 2021-02-16 DIAGNOSIS — D631 Anemia in chronic kidney disease: Secondary | ICD-10-CM | POA: Diagnosis not present

## 2021-02-16 DIAGNOSIS — N2589 Other disorders resulting from impaired renal tubular function: Secondary | ICD-10-CM | POA: Diagnosis not present

## 2021-02-16 DIAGNOSIS — E440 Moderate protein-calorie malnutrition: Secondary | ICD-10-CM | POA: Diagnosis not present

## 2021-02-17 DIAGNOSIS — R17 Unspecified jaundice: Secondary | ICD-10-CM | POA: Diagnosis not present

## 2021-02-17 DIAGNOSIS — Z79899 Other long term (current) drug therapy: Secondary | ICD-10-CM | POA: Diagnosis not present

## 2021-02-17 DIAGNOSIS — D509 Iron deficiency anemia, unspecified: Secondary | ICD-10-CM | POA: Diagnosis not present

## 2021-02-17 DIAGNOSIS — Z992 Dependence on renal dialysis: Secondary | ICD-10-CM | POA: Diagnosis not present

## 2021-02-17 DIAGNOSIS — E440 Moderate protein-calorie malnutrition: Secondary | ICD-10-CM | POA: Diagnosis not present

## 2021-02-17 DIAGNOSIS — D631 Anemia in chronic kidney disease: Secondary | ICD-10-CM | POA: Diagnosis not present

## 2021-02-17 DIAGNOSIS — N2589 Other disorders resulting from impaired renal tubular function: Secondary | ICD-10-CM | POA: Diagnosis not present

## 2021-02-17 DIAGNOSIS — N186 End stage renal disease: Secondary | ICD-10-CM | POA: Diagnosis not present

## 2021-02-17 DIAGNOSIS — N2581 Secondary hyperparathyroidism of renal origin: Secondary | ICD-10-CM | POA: Diagnosis not present

## 2021-02-18 DIAGNOSIS — Z992 Dependence on renal dialysis: Secondary | ICD-10-CM | POA: Diagnosis not present

## 2021-02-18 DIAGNOSIS — D631 Anemia in chronic kidney disease: Secondary | ICD-10-CM | POA: Diagnosis not present

## 2021-02-18 DIAGNOSIS — Z79899 Other long term (current) drug therapy: Secondary | ICD-10-CM | POA: Diagnosis not present

## 2021-02-18 DIAGNOSIS — R17 Unspecified jaundice: Secondary | ICD-10-CM | POA: Diagnosis not present

## 2021-02-18 DIAGNOSIS — N2589 Other disorders resulting from impaired renal tubular function: Secondary | ICD-10-CM | POA: Diagnosis not present

## 2021-02-18 DIAGNOSIS — N2581 Secondary hyperparathyroidism of renal origin: Secondary | ICD-10-CM | POA: Diagnosis not present

## 2021-02-18 DIAGNOSIS — D509 Iron deficiency anemia, unspecified: Secondary | ICD-10-CM | POA: Diagnosis not present

## 2021-02-18 DIAGNOSIS — N186 End stage renal disease: Secondary | ICD-10-CM | POA: Diagnosis not present

## 2021-02-18 DIAGNOSIS — E440 Moderate protein-calorie malnutrition: Secondary | ICD-10-CM | POA: Diagnosis not present

## 2021-02-19 DIAGNOSIS — D631 Anemia in chronic kidney disease: Secondary | ICD-10-CM | POA: Diagnosis not present

## 2021-02-19 DIAGNOSIS — Z79899 Other long term (current) drug therapy: Secondary | ICD-10-CM | POA: Diagnosis not present

## 2021-02-19 DIAGNOSIS — Z992 Dependence on renal dialysis: Secondary | ICD-10-CM | POA: Diagnosis not present

## 2021-02-19 DIAGNOSIS — D509 Iron deficiency anemia, unspecified: Secondary | ICD-10-CM | POA: Diagnosis not present

## 2021-02-19 DIAGNOSIS — N2581 Secondary hyperparathyroidism of renal origin: Secondary | ICD-10-CM | POA: Diagnosis not present

## 2021-02-19 DIAGNOSIS — R17 Unspecified jaundice: Secondary | ICD-10-CM | POA: Diagnosis not present

## 2021-02-19 DIAGNOSIS — N2589 Other disorders resulting from impaired renal tubular function: Secondary | ICD-10-CM | POA: Diagnosis not present

## 2021-02-19 DIAGNOSIS — N186 End stage renal disease: Secondary | ICD-10-CM | POA: Diagnosis not present

## 2021-02-19 DIAGNOSIS — E440 Moderate protein-calorie malnutrition: Secondary | ICD-10-CM | POA: Diagnosis not present

## 2021-02-20 DIAGNOSIS — R17 Unspecified jaundice: Secondary | ICD-10-CM | POA: Diagnosis not present

## 2021-02-20 DIAGNOSIS — Z79899 Other long term (current) drug therapy: Secondary | ICD-10-CM | POA: Diagnosis not present

## 2021-02-20 DIAGNOSIS — N186 End stage renal disease: Secondary | ICD-10-CM | POA: Diagnosis not present

## 2021-02-20 DIAGNOSIS — E440 Moderate protein-calorie malnutrition: Secondary | ICD-10-CM | POA: Diagnosis not present

## 2021-02-20 DIAGNOSIS — N2581 Secondary hyperparathyroidism of renal origin: Secondary | ICD-10-CM | POA: Diagnosis not present

## 2021-02-20 DIAGNOSIS — D631 Anemia in chronic kidney disease: Secondary | ICD-10-CM | POA: Diagnosis not present

## 2021-02-20 DIAGNOSIS — Z992 Dependence on renal dialysis: Secondary | ICD-10-CM | POA: Diagnosis not present

## 2021-02-20 DIAGNOSIS — N2589 Other disorders resulting from impaired renal tubular function: Secondary | ICD-10-CM | POA: Diagnosis not present

## 2021-02-20 DIAGNOSIS — D509 Iron deficiency anemia, unspecified: Secondary | ICD-10-CM | POA: Diagnosis not present

## 2021-02-21 DIAGNOSIS — N2581 Secondary hyperparathyroidism of renal origin: Secondary | ICD-10-CM | POA: Diagnosis not present

## 2021-02-21 DIAGNOSIS — N186 End stage renal disease: Secondary | ICD-10-CM | POA: Diagnosis not present

## 2021-02-21 DIAGNOSIS — D631 Anemia in chronic kidney disease: Secondary | ICD-10-CM | POA: Diagnosis not present

## 2021-02-21 DIAGNOSIS — R17 Unspecified jaundice: Secondary | ICD-10-CM | POA: Diagnosis not present

## 2021-02-21 DIAGNOSIS — Z79899 Other long term (current) drug therapy: Secondary | ICD-10-CM | POA: Diagnosis not present

## 2021-02-21 DIAGNOSIS — Z992 Dependence on renal dialysis: Secondary | ICD-10-CM | POA: Diagnosis not present

## 2021-02-21 DIAGNOSIS — E440 Moderate protein-calorie malnutrition: Secondary | ICD-10-CM | POA: Diagnosis not present

## 2021-02-21 DIAGNOSIS — N2589 Other disorders resulting from impaired renal tubular function: Secondary | ICD-10-CM | POA: Diagnosis not present

## 2021-02-21 DIAGNOSIS — D509 Iron deficiency anemia, unspecified: Secondary | ICD-10-CM | POA: Diagnosis not present

## 2021-02-22 DIAGNOSIS — N2581 Secondary hyperparathyroidism of renal origin: Secondary | ICD-10-CM | POA: Diagnosis not present

## 2021-02-22 DIAGNOSIS — N186 End stage renal disease: Secondary | ICD-10-CM | POA: Diagnosis not present

## 2021-02-22 DIAGNOSIS — D509 Iron deficiency anemia, unspecified: Secondary | ICD-10-CM | POA: Diagnosis not present

## 2021-02-22 DIAGNOSIS — R17 Unspecified jaundice: Secondary | ICD-10-CM | POA: Diagnosis not present

## 2021-02-22 DIAGNOSIS — Z992 Dependence on renal dialysis: Secondary | ICD-10-CM | POA: Diagnosis not present

## 2021-02-22 DIAGNOSIS — N2589 Other disorders resulting from impaired renal tubular function: Secondary | ICD-10-CM | POA: Diagnosis not present

## 2021-02-22 DIAGNOSIS — D631 Anemia in chronic kidney disease: Secondary | ICD-10-CM | POA: Diagnosis not present

## 2021-02-22 DIAGNOSIS — E440 Moderate protein-calorie malnutrition: Secondary | ICD-10-CM | POA: Diagnosis not present

## 2021-02-22 DIAGNOSIS — Z79899 Other long term (current) drug therapy: Secondary | ICD-10-CM | POA: Diagnosis not present

## 2021-02-23 DIAGNOSIS — N2589 Other disorders resulting from impaired renal tubular function: Secondary | ICD-10-CM | POA: Diagnosis not present

## 2021-02-23 DIAGNOSIS — E440 Moderate protein-calorie malnutrition: Secondary | ICD-10-CM | POA: Diagnosis not present

## 2021-02-23 DIAGNOSIS — Z992 Dependence on renal dialysis: Secondary | ICD-10-CM | POA: Diagnosis not present

## 2021-02-23 DIAGNOSIS — N2581 Secondary hyperparathyroidism of renal origin: Secondary | ICD-10-CM | POA: Diagnosis not present

## 2021-02-23 DIAGNOSIS — D509 Iron deficiency anemia, unspecified: Secondary | ICD-10-CM | POA: Diagnosis not present

## 2021-02-23 DIAGNOSIS — Z79899 Other long term (current) drug therapy: Secondary | ICD-10-CM | POA: Diagnosis not present

## 2021-02-23 DIAGNOSIS — D631 Anemia in chronic kidney disease: Secondary | ICD-10-CM | POA: Diagnosis not present

## 2021-02-23 DIAGNOSIS — R17 Unspecified jaundice: Secondary | ICD-10-CM | POA: Diagnosis not present

## 2021-02-23 DIAGNOSIS — N186 End stage renal disease: Secondary | ICD-10-CM | POA: Diagnosis not present

## 2021-02-24 DIAGNOSIS — Z79899 Other long term (current) drug therapy: Secondary | ICD-10-CM | POA: Diagnosis not present

## 2021-02-24 DIAGNOSIS — Z794 Long term (current) use of insulin: Secondary | ICD-10-CM | POA: Diagnosis not present

## 2021-02-24 DIAGNOSIS — D631 Anemia in chronic kidney disease: Secondary | ICD-10-CM | POA: Diagnosis not present

## 2021-02-24 DIAGNOSIS — D509 Iron deficiency anemia, unspecified: Secondary | ICD-10-CM | POA: Diagnosis not present

## 2021-02-24 DIAGNOSIS — N186 End stage renal disease: Secondary | ICD-10-CM | POA: Diagnosis not present

## 2021-02-24 DIAGNOSIS — L97522 Non-pressure chronic ulcer of other part of left foot with fat layer exposed: Secondary | ICD-10-CM | POA: Diagnosis not present

## 2021-02-24 DIAGNOSIS — N2589 Other disorders resulting from impaired renal tubular function: Secondary | ICD-10-CM | POA: Diagnosis not present

## 2021-02-24 DIAGNOSIS — E11621 Type 2 diabetes mellitus with foot ulcer: Secondary | ICD-10-CM | POA: Diagnosis not present

## 2021-02-24 DIAGNOSIS — R17 Unspecified jaundice: Secondary | ICD-10-CM | POA: Diagnosis not present

## 2021-02-24 DIAGNOSIS — Z992 Dependence on renal dialysis: Secondary | ICD-10-CM | POA: Diagnosis not present

## 2021-02-24 DIAGNOSIS — Z89422 Acquired absence of other left toe(s): Secondary | ICD-10-CM | POA: Diagnosis not present

## 2021-02-24 DIAGNOSIS — E440 Moderate protein-calorie malnutrition: Secondary | ICD-10-CM | POA: Diagnosis not present

## 2021-02-24 DIAGNOSIS — N2581 Secondary hyperparathyroidism of renal origin: Secondary | ICD-10-CM | POA: Diagnosis not present

## 2021-02-25 DIAGNOSIS — Z992 Dependence on renal dialysis: Secondary | ICD-10-CM | POA: Diagnosis not present

## 2021-02-25 DIAGNOSIS — N2581 Secondary hyperparathyroidism of renal origin: Secondary | ICD-10-CM | POA: Diagnosis not present

## 2021-02-25 DIAGNOSIS — R17 Unspecified jaundice: Secondary | ICD-10-CM | POA: Diagnosis not present

## 2021-02-25 DIAGNOSIS — N2589 Other disorders resulting from impaired renal tubular function: Secondary | ICD-10-CM | POA: Diagnosis not present

## 2021-02-25 DIAGNOSIS — N186 End stage renal disease: Secondary | ICD-10-CM | POA: Diagnosis not present

## 2021-02-25 DIAGNOSIS — D631 Anemia in chronic kidney disease: Secondary | ICD-10-CM | POA: Diagnosis not present

## 2021-02-25 DIAGNOSIS — Z79899 Other long term (current) drug therapy: Secondary | ICD-10-CM | POA: Diagnosis not present

## 2021-02-25 DIAGNOSIS — E440 Moderate protein-calorie malnutrition: Secondary | ICD-10-CM | POA: Diagnosis not present

## 2021-02-25 DIAGNOSIS — D509 Iron deficiency anemia, unspecified: Secondary | ICD-10-CM | POA: Diagnosis not present

## 2021-02-26 DIAGNOSIS — Z992 Dependence on renal dialysis: Secondary | ICD-10-CM | POA: Diagnosis not present

## 2021-02-26 DIAGNOSIS — Z79899 Other long term (current) drug therapy: Secondary | ICD-10-CM | POA: Diagnosis not present

## 2021-02-26 DIAGNOSIS — N2581 Secondary hyperparathyroidism of renal origin: Secondary | ICD-10-CM | POA: Diagnosis not present

## 2021-02-26 DIAGNOSIS — N2589 Other disorders resulting from impaired renal tubular function: Secondary | ICD-10-CM | POA: Diagnosis not present

## 2021-02-26 DIAGNOSIS — R17 Unspecified jaundice: Secondary | ICD-10-CM | POA: Diagnosis not present

## 2021-02-26 DIAGNOSIS — N186 End stage renal disease: Secondary | ICD-10-CM | POA: Diagnosis not present

## 2021-02-26 DIAGNOSIS — D631 Anemia in chronic kidney disease: Secondary | ICD-10-CM | POA: Diagnosis not present

## 2021-02-26 DIAGNOSIS — E440 Moderate protein-calorie malnutrition: Secondary | ICD-10-CM | POA: Diagnosis not present

## 2021-02-26 DIAGNOSIS — D509 Iron deficiency anemia, unspecified: Secondary | ICD-10-CM | POA: Diagnosis not present

## 2021-02-27 DIAGNOSIS — N2589 Other disorders resulting from impaired renal tubular function: Secondary | ICD-10-CM | POA: Diagnosis not present

## 2021-02-27 DIAGNOSIS — N186 End stage renal disease: Secondary | ICD-10-CM | POA: Diagnosis not present

## 2021-02-27 DIAGNOSIS — Z79899 Other long term (current) drug therapy: Secondary | ICD-10-CM | POA: Diagnosis not present

## 2021-02-27 DIAGNOSIS — N2581 Secondary hyperparathyroidism of renal origin: Secondary | ICD-10-CM | POA: Diagnosis not present

## 2021-02-27 DIAGNOSIS — D631 Anemia in chronic kidney disease: Secondary | ICD-10-CM | POA: Diagnosis not present

## 2021-02-27 DIAGNOSIS — Z992 Dependence on renal dialysis: Secondary | ICD-10-CM | POA: Diagnosis not present

## 2021-02-27 DIAGNOSIS — R17 Unspecified jaundice: Secondary | ICD-10-CM | POA: Diagnosis not present

## 2021-02-27 DIAGNOSIS — D509 Iron deficiency anemia, unspecified: Secondary | ICD-10-CM | POA: Diagnosis not present

## 2021-02-27 DIAGNOSIS — E440 Moderate protein-calorie malnutrition: Secondary | ICD-10-CM | POA: Diagnosis not present

## 2021-02-28 DIAGNOSIS — E440 Moderate protein-calorie malnutrition: Secondary | ICD-10-CM | POA: Diagnosis not present

## 2021-02-28 DIAGNOSIS — N2581 Secondary hyperparathyroidism of renal origin: Secondary | ICD-10-CM | POA: Diagnosis not present

## 2021-02-28 DIAGNOSIS — Z79899 Other long term (current) drug therapy: Secondary | ICD-10-CM | POA: Diagnosis not present

## 2021-02-28 DIAGNOSIS — N186 End stage renal disease: Secondary | ICD-10-CM | POA: Diagnosis not present

## 2021-02-28 DIAGNOSIS — Z992 Dependence on renal dialysis: Secondary | ICD-10-CM | POA: Diagnosis not present

## 2021-02-28 DIAGNOSIS — D631 Anemia in chronic kidney disease: Secondary | ICD-10-CM | POA: Diagnosis not present

## 2021-02-28 DIAGNOSIS — R17 Unspecified jaundice: Secondary | ICD-10-CM | POA: Diagnosis not present

## 2021-02-28 DIAGNOSIS — D509 Iron deficiency anemia, unspecified: Secondary | ICD-10-CM | POA: Diagnosis not present

## 2021-02-28 DIAGNOSIS — N2589 Other disorders resulting from impaired renal tubular function: Secondary | ICD-10-CM | POA: Diagnosis not present

## 2021-03-01 DIAGNOSIS — Z79899 Other long term (current) drug therapy: Secondary | ICD-10-CM | POA: Diagnosis not present

## 2021-03-01 DIAGNOSIS — Z992 Dependence on renal dialysis: Secondary | ICD-10-CM | POA: Diagnosis not present

## 2021-03-01 DIAGNOSIS — R17 Unspecified jaundice: Secondary | ICD-10-CM | POA: Diagnosis not present

## 2021-03-01 DIAGNOSIS — D631 Anemia in chronic kidney disease: Secondary | ICD-10-CM | POA: Diagnosis not present

## 2021-03-01 DIAGNOSIS — N2589 Other disorders resulting from impaired renal tubular function: Secondary | ICD-10-CM | POA: Diagnosis not present

## 2021-03-01 DIAGNOSIS — N186 End stage renal disease: Secondary | ICD-10-CM | POA: Diagnosis not present

## 2021-03-01 DIAGNOSIS — N2581 Secondary hyperparathyroidism of renal origin: Secondary | ICD-10-CM | POA: Diagnosis not present

## 2021-03-01 DIAGNOSIS — D509 Iron deficiency anemia, unspecified: Secondary | ICD-10-CM | POA: Diagnosis not present

## 2021-03-01 DIAGNOSIS — E440 Moderate protein-calorie malnutrition: Secondary | ICD-10-CM | POA: Diagnosis not present

## 2021-03-02 DIAGNOSIS — D509 Iron deficiency anemia, unspecified: Secondary | ICD-10-CM | POA: Diagnosis not present

## 2021-03-02 DIAGNOSIS — Z79899 Other long term (current) drug therapy: Secondary | ICD-10-CM | POA: Diagnosis not present

## 2021-03-02 DIAGNOSIS — N2589 Other disorders resulting from impaired renal tubular function: Secondary | ICD-10-CM | POA: Diagnosis not present

## 2021-03-02 DIAGNOSIS — Z992 Dependence on renal dialysis: Secondary | ICD-10-CM | POA: Diagnosis not present

## 2021-03-02 DIAGNOSIS — E440 Moderate protein-calorie malnutrition: Secondary | ICD-10-CM | POA: Diagnosis not present

## 2021-03-02 DIAGNOSIS — N2581 Secondary hyperparathyroidism of renal origin: Secondary | ICD-10-CM | POA: Diagnosis not present

## 2021-03-02 DIAGNOSIS — D631 Anemia in chronic kidney disease: Secondary | ICD-10-CM | POA: Diagnosis not present

## 2021-03-02 DIAGNOSIS — N186 End stage renal disease: Secondary | ICD-10-CM | POA: Diagnosis not present

## 2021-03-02 DIAGNOSIS — R17 Unspecified jaundice: Secondary | ICD-10-CM | POA: Diagnosis not present

## 2021-03-03 DIAGNOSIS — N2581 Secondary hyperparathyroidism of renal origin: Secondary | ICD-10-CM | POA: Diagnosis not present

## 2021-03-03 DIAGNOSIS — N2589 Other disorders resulting from impaired renal tubular function: Secondary | ICD-10-CM | POA: Diagnosis not present

## 2021-03-03 DIAGNOSIS — N186 End stage renal disease: Secondary | ICD-10-CM | POA: Diagnosis not present

## 2021-03-03 DIAGNOSIS — E440 Moderate protein-calorie malnutrition: Secondary | ICD-10-CM | POA: Diagnosis not present

## 2021-03-03 DIAGNOSIS — D509 Iron deficiency anemia, unspecified: Secondary | ICD-10-CM | POA: Diagnosis not present

## 2021-03-03 DIAGNOSIS — D631 Anemia in chronic kidney disease: Secondary | ICD-10-CM | POA: Diagnosis not present

## 2021-03-03 DIAGNOSIS — Z992 Dependence on renal dialysis: Secondary | ICD-10-CM | POA: Diagnosis not present

## 2021-03-03 DIAGNOSIS — R17 Unspecified jaundice: Secondary | ICD-10-CM | POA: Diagnosis not present

## 2021-03-03 DIAGNOSIS — Z79899 Other long term (current) drug therapy: Secondary | ICD-10-CM | POA: Diagnosis not present

## 2021-03-04 DIAGNOSIS — E440 Moderate protein-calorie malnutrition: Secondary | ICD-10-CM | POA: Diagnosis not present

## 2021-03-04 DIAGNOSIS — D509 Iron deficiency anemia, unspecified: Secondary | ICD-10-CM | POA: Diagnosis not present

## 2021-03-04 DIAGNOSIS — R17 Unspecified jaundice: Secondary | ICD-10-CM | POA: Diagnosis not present

## 2021-03-04 DIAGNOSIS — N186 End stage renal disease: Secondary | ICD-10-CM | POA: Diagnosis not present

## 2021-03-04 DIAGNOSIS — D631 Anemia in chronic kidney disease: Secondary | ICD-10-CM | POA: Diagnosis not present

## 2021-03-04 DIAGNOSIS — Z79899 Other long term (current) drug therapy: Secondary | ICD-10-CM | POA: Diagnosis not present

## 2021-03-04 DIAGNOSIS — N2589 Other disorders resulting from impaired renal tubular function: Secondary | ICD-10-CM | POA: Diagnosis not present

## 2021-03-04 DIAGNOSIS — N2581 Secondary hyperparathyroidism of renal origin: Secondary | ICD-10-CM | POA: Diagnosis not present

## 2021-03-04 DIAGNOSIS — Z992 Dependence on renal dialysis: Secondary | ICD-10-CM | POA: Diagnosis not present

## 2021-03-05 DIAGNOSIS — R17 Unspecified jaundice: Secondary | ICD-10-CM | POA: Diagnosis not present

## 2021-03-05 DIAGNOSIS — N2589 Other disorders resulting from impaired renal tubular function: Secondary | ICD-10-CM | POA: Diagnosis not present

## 2021-03-05 DIAGNOSIS — N186 End stage renal disease: Secondary | ICD-10-CM | POA: Diagnosis not present

## 2021-03-05 DIAGNOSIS — D631 Anemia in chronic kidney disease: Secondary | ICD-10-CM | POA: Diagnosis not present

## 2021-03-05 DIAGNOSIS — Z79899 Other long term (current) drug therapy: Secondary | ICD-10-CM | POA: Diagnosis not present

## 2021-03-05 DIAGNOSIS — E440 Moderate protein-calorie malnutrition: Secondary | ICD-10-CM | POA: Diagnosis not present

## 2021-03-05 DIAGNOSIS — D509 Iron deficiency anemia, unspecified: Secondary | ICD-10-CM | POA: Diagnosis not present

## 2021-03-05 DIAGNOSIS — N2581 Secondary hyperparathyroidism of renal origin: Secondary | ICD-10-CM | POA: Diagnosis not present

## 2021-03-05 DIAGNOSIS — Z992 Dependence on renal dialysis: Secondary | ICD-10-CM | POA: Diagnosis not present

## 2021-03-06 DIAGNOSIS — Z992 Dependence on renal dialysis: Secondary | ICD-10-CM | POA: Diagnosis not present

## 2021-03-06 DIAGNOSIS — R17 Unspecified jaundice: Secondary | ICD-10-CM | POA: Diagnosis not present

## 2021-03-06 DIAGNOSIS — E440 Moderate protein-calorie malnutrition: Secondary | ICD-10-CM | POA: Diagnosis not present

## 2021-03-06 DIAGNOSIS — N186 End stage renal disease: Secondary | ICD-10-CM | POA: Diagnosis not present

## 2021-03-06 DIAGNOSIS — Z79899 Other long term (current) drug therapy: Secondary | ICD-10-CM | POA: Diagnosis not present

## 2021-03-06 DIAGNOSIS — D509 Iron deficiency anemia, unspecified: Secondary | ICD-10-CM | POA: Diagnosis not present

## 2021-03-06 DIAGNOSIS — N2581 Secondary hyperparathyroidism of renal origin: Secondary | ICD-10-CM | POA: Diagnosis not present

## 2021-03-06 DIAGNOSIS — D631 Anemia in chronic kidney disease: Secondary | ICD-10-CM | POA: Diagnosis not present

## 2021-03-06 DIAGNOSIS — N2589 Other disorders resulting from impaired renal tubular function: Secondary | ICD-10-CM | POA: Diagnosis not present

## 2021-03-07 DIAGNOSIS — N2581 Secondary hyperparathyroidism of renal origin: Secondary | ICD-10-CM | POA: Diagnosis not present

## 2021-03-07 DIAGNOSIS — E1122 Type 2 diabetes mellitus with diabetic chronic kidney disease: Secondary | ICD-10-CM | POA: Diagnosis not present

## 2021-03-07 DIAGNOSIS — E440 Moderate protein-calorie malnutrition: Secondary | ICD-10-CM | POA: Diagnosis not present

## 2021-03-07 DIAGNOSIS — N2589 Other disorders resulting from impaired renal tubular function: Secondary | ICD-10-CM | POA: Diagnosis not present

## 2021-03-07 DIAGNOSIS — D509 Iron deficiency anemia, unspecified: Secondary | ICD-10-CM | POA: Diagnosis not present

## 2021-03-07 DIAGNOSIS — Z79899 Other long term (current) drug therapy: Secondary | ICD-10-CM | POA: Diagnosis not present

## 2021-03-07 DIAGNOSIS — R17 Unspecified jaundice: Secondary | ICD-10-CM | POA: Diagnosis not present

## 2021-03-07 DIAGNOSIS — D631 Anemia in chronic kidney disease: Secondary | ICD-10-CM | POA: Diagnosis not present

## 2021-03-07 DIAGNOSIS — N186 End stage renal disease: Secondary | ICD-10-CM | POA: Diagnosis not present

## 2021-03-07 DIAGNOSIS — Z992 Dependence on renal dialysis: Secondary | ICD-10-CM | POA: Diagnosis not present

## 2021-03-08 DIAGNOSIS — N2589 Other disorders resulting from impaired renal tubular function: Secondary | ICD-10-CM | POA: Diagnosis not present

## 2021-03-08 DIAGNOSIS — D631 Anemia in chronic kidney disease: Secondary | ICD-10-CM | POA: Diagnosis not present

## 2021-03-08 DIAGNOSIS — R17 Unspecified jaundice: Secondary | ICD-10-CM | POA: Diagnosis not present

## 2021-03-08 DIAGNOSIS — Z79899 Other long term (current) drug therapy: Secondary | ICD-10-CM | POA: Diagnosis not present

## 2021-03-08 DIAGNOSIS — D509 Iron deficiency anemia, unspecified: Secondary | ICD-10-CM | POA: Diagnosis not present

## 2021-03-08 DIAGNOSIS — Z992 Dependence on renal dialysis: Secondary | ICD-10-CM | POA: Diagnosis not present

## 2021-03-08 DIAGNOSIS — N2581 Secondary hyperparathyroidism of renal origin: Secondary | ICD-10-CM | POA: Diagnosis not present

## 2021-03-08 DIAGNOSIS — E440 Moderate protein-calorie malnutrition: Secondary | ICD-10-CM | POA: Diagnosis not present

## 2021-03-08 DIAGNOSIS — N186 End stage renal disease: Secondary | ICD-10-CM | POA: Diagnosis not present

## 2021-03-09 DIAGNOSIS — Z992 Dependence on renal dialysis: Secondary | ICD-10-CM | POA: Diagnosis not present

## 2021-03-09 DIAGNOSIS — N186 End stage renal disease: Secondary | ICD-10-CM | POA: Diagnosis not present

## 2021-03-09 DIAGNOSIS — R17 Unspecified jaundice: Secondary | ICD-10-CM | POA: Diagnosis not present

## 2021-03-09 DIAGNOSIS — D631 Anemia in chronic kidney disease: Secondary | ICD-10-CM | POA: Diagnosis not present

## 2021-03-09 DIAGNOSIS — Z79899 Other long term (current) drug therapy: Secondary | ICD-10-CM | POA: Diagnosis not present

## 2021-03-09 DIAGNOSIS — N2589 Other disorders resulting from impaired renal tubular function: Secondary | ICD-10-CM | POA: Diagnosis not present

## 2021-03-09 DIAGNOSIS — E440 Moderate protein-calorie malnutrition: Secondary | ICD-10-CM | POA: Diagnosis not present

## 2021-03-09 DIAGNOSIS — N2581 Secondary hyperparathyroidism of renal origin: Secondary | ICD-10-CM | POA: Diagnosis not present

## 2021-03-09 DIAGNOSIS — D509 Iron deficiency anemia, unspecified: Secondary | ICD-10-CM | POA: Diagnosis not present

## 2021-03-10 DIAGNOSIS — E440 Moderate protein-calorie malnutrition: Secondary | ICD-10-CM | POA: Diagnosis not present

## 2021-03-10 DIAGNOSIS — D509 Iron deficiency anemia, unspecified: Secondary | ICD-10-CM | POA: Diagnosis not present

## 2021-03-10 DIAGNOSIS — E11621 Type 2 diabetes mellitus with foot ulcer: Secondary | ICD-10-CM | POA: Diagnosis not present

## 2021-03-10 DIAGNOSIS — N2589 Other disorders resulting from impaired renal tubular function: Secondary | ICD-10-CM | POA: Diagnosis not present

## 2021-03-10 DIAGNOSIS — N186 End stage renal disease: Secondary | ICD-10-CM | POA: Diagnosis not present

## 2021-03-10 DIAGNOSIS — Z794 Long term (current) use of insulin: Secondary | ICD-10-CM | POA: Diagnosis not present

## 2021-03-10 DIAGNOSIS — Z992 Dependence on renal dialysis: Secondary | ICD-10-CM | POA: Diagnosis not present

## 2021-03-10 DIAGNOSIS — Z79899 Other long term (current) drug therapy: Secondary | ICD-10-CM | POA: Diagnosis not present

## 2021-03-10 DIAGNOSIS — N2581 Secondary hyperparathyroidism of renal origin: Secondary | ICD-10-CM | POA: Diagnosis not present

## 2021-03-10 DIAGNOSIS — L97522 Non-pressure chronic ulcer of other part of left foot with fat layer exposed: Secondary | ICD-10-CM | POA: Diagnosis not present

## 2021-03-10 DIAGNOSIS — D631 Anemia in chronic kidney disease: Secondary | ICD-10-CM | POA: Diagnosis not present

## 2021-03-10 DIAGNOSIS — Z89422 Acquired absence of other left toe(s): Secondary | ICD-10-CM | POA: Diagnosis not present

## 2021-03-10 DIAGNOSIS — R17 Unspecified jaundice: Secondary | ICD-10-CM | POA: Diagnosis not present

## 2021-03-11 DIAGNOSIS — N2581 Secondary hyperparathyroidism of renal origin: Secondary | ICD-10-CM | POA: Diagnosis not present

## 2021-03-11 DIAGNOSIS — D509 Iron deficiency anemia, unspecified: Secondary | ICD-10-CM | POA: Diagnosis not present

## 2021-03-11 DIAGNOSIS — D631 Anemia in chronic kidney disease: Secondary | ICD-10-CM | POA: Diagnosis not present

## 2021-03-11 DIAGNOSIS — Z992 Dependence on renal dialysis: Secondary | ICD-10-CM | POA: Diagnosis not present

## 2021-03-11 DIAGNOSIS — R17 Unspecified jaundice: Secondary | ICD-10-CM | POA: Diagnosis not present

## 2021-03-11 DIAGNOSIS — N2589 Other disorders resulting from impaired renal tubular function: Secondary | ICD-10-CM | POA: Diagnosis not present

## 2021-03-11 DIAGNOSIS — Z79899 Other long term (current) drug therapy: Secondary | ICD-10-CM | POA: Diagnosis not present

## 2021-03-11 DIAGNOSIS — N186 End stage renal disease: Secondary | ICD-10-CM | POA: Diagnosis not present

## 2021-03-11 DIAGNOSIS — E440 Moderate protein-calorie malnutrition: Secondary | ICD-10-CM | POA: Diagnosis not present

## 2021-03-12 DIAGNOSIS — D509 Iron deficiency anemia, unspecified: Secondary | ICD-10-CM | POA: Diagnosis not present

## 2021-03-12 DIAGNOSIS — D631 Anemia in chronic kidney disease: Secondary | ICD-10-CM | POA: Diagnosis not present

## 2021-03-12 DIAGNOSIS — E440 Moderate protein-calorie malnutrition: Secondary | ICD-10-CM | POA: Diagnosis not present

## 2021-03-12 DIAGNOSIS — Z992 Dependence on renal dialysis: Secondary | ICD-10-CM | POA: Diagnosis not present

## 2021-03-12 DIAGNOSIS — N2581 Secondary hyperparathyroidism of renal origin: Secondary | ICD-10-CM | POA: Diagnosis not present

## 2021-03-12 DIAGNOSIS — N186 End stage renal disease: Secondary | ICD-10-CM | POA: Diagnosis not present

## 2021-03-12 DIAGNOSIS — R17 Unspecified jaundice: Secondary | ICD-10-CM | POA: Diagnosis not present

## 2021-03-12 DIAGNOSIS — Z79899 Other long term (current) drug therapy: Secondary | ICD-10-CM | POA: Diagnosis not present

## 2021-03-12 DIAGNOSIS — N2589 Other disorders resulting from impaired renal tubular function: Secondary | ICD-10-CM | POA: Diagnosis not present

## 2021-03-13 DIAGNOSIS — Z79899 Other long term (current) drug therapy: Secondary | ICD-10-CM | POA: Diagnosis not present

## 2021-03-13 DIAGNOSIS — N186 End stage renal disease: Secondary | ICD-10-CM | POA: Diagnosis not present

## 2021-03-13 DIAGNOSIS — E440 Moderate protein-calorie malnutrition: Secondary | ICD-10-CM | POA: Diagnosis not present

## 2021-03-13 DIAGNOSIS — D631 Anemia in chronic kidney disease: Secondary | ICD-10-CM | POA: Diagnosis not present

## 2021-03-13 DIAGNOSIS — Z992 Dependence on renal dialysis: Secondary | ICD-10-CM | POA: Diagnosis not present

## 2021-03-13 DIAGNOSIS — N2589 Other disorders resulting from impaired renal tubular function: Secondary | ICD-10-CM | POA: Diagnosis not present

## 2021-03-13 DIAGNOSIS — R17 Unspecified jaundice: Secondary | ICD-10-CM | POA: Diagnosis not present

## 2021-03-13 DIAGNOSIS — N2581 Secondary hyperparathyroidism of renal origin: Secondary | ICD-10-CM | POA: Diagnosis not present

## 2021-03-13 DIAGNOSIS — D509 Iron deficiency anemia, unspecified: Secondary | ICD-10-CM | POA: Diagnosis not present

## 2021-03-14 DIAGNOSIS — E440 Moderate protein-calorie malnutrition: Secondary | ICD-10-CM | POA: Diagnosis not present

## 2021-03-14 DIAGNOSIS — Z79899 Other long term (current) drug therapy: Secondary | ICD-10-CM | POA: Diagnosis not present

## 2021-03-14 DIAGNOSIS — R17 Unspecified jaundice: Secondary | ICD-10-CM | POA: Diagnosis not present

## 2021-03-14 DIAGNOSIS — D509 Iron deficiency anemia, unspecified: Secondary | ICD-10-CM | POA: Diagnosis not present

## 2021-03-14 DIAGNOSIS — N186 End stage renal disease: Secondary | ICD-10-CM | POA: Diagnosis not present

## 2021-03-14 DIAGNOSIS — N2581 Secondary hyperparathyroidism of renal origin: Secondary | ICD-10-CM | POA: Diagnosis not present

## 2021-03-14 DIAGNOSIS — N2589 Other disorders resulting from impaired renal tubular function: Secondary | ICD-10-CM | POA: Diagnosis not present

## 2021-03-14 DIAGNOSIS — D631 Anemia in chronic kidney disease: Secondary | ICD-10-CM | POA: Diagnosis not present

## 2021-03-14 DIAGNOSIS — Z992 Dependence on renal dialysis: Secondary | ICD-10-CM | POA: Diagnosis not present

## 2021-03-15 DIAGNOSIS — N2589 Other disorders resulting from impaired renal tubular function: Secondary | ICD-10-CM | POA: Diagnosis not present

## 2021-03-15 DIAGNOSIS — N186 End stage renal disease: Secondary | ICD-10-CM | POA: Diagnosis not present

## 2021-03-15 DIAGNOSIS — D509 Iron deficiency anemia, unspecified: Secondary | ICD-10-CM | POA: Diagnosis not present

## 2021-03-15 DIAGNOSIS — N2581 Secondary hyperparathyroidism of renal origin: Secondary | ICD-10-CM | POA: Diagnosis not present

## 2021-03-15 DIAGNOSIS — Z992 Dependence on renal dialysis: Secondary | ICD-10-CM | POA: Diagnosis not present

## 2021-03-15 DIAGNOSIS — Z79899 Other long term (current) drug therapy: Secondary | ICD-10-CM | POA: Diagnosis not present

## 2021-03-15 DIAGNOSIS — D631 Anemia in chronic kidney disease: Secondary | ICD-10-CM | POA: Diagnosis not present

## 2021-03-15 DIAGNOSIS — E440 Moderate protein-calorie malnutrition: Secondary | ICD-10-CM | POA: Diagnosis not present

## 2021-03-15 DIAGNOSIS — R17 Unspecified jaundice: Secondary | ICD-10-CM | POA: Diagnosis not present

## 2021-03-16 DIAGNOSIS — Z79899 Other long term (current) drug therapy: Secondary | ICD-10-CM | POA: Diagnosis not present

## 2021-03-16 DIAGNOSIS — N186 End stage renal disease: Secondary | ICD-10-CM | POA: Diagnosis not present

## 2021-03-16 DIAGNOSIS — Z992 Dependence on renal dialysis: Secondary | ICD-10-CM | POA: Diagnosis not present

## 2021-03-16 DIAGNOSIS — R17 Unspecified jaundice: Secondary | ICD-10-CM | POA: Diagnosis not present

## 2021-03-16 DIAGNOSIS — D631 Anemia in chronic kidney disease: Secondary | ICD-10-CM | POA: Diagnosis not present

## 2021-03-16 DIAGNOSIS — D509 Iron deficiency anemia, unspecified: Secondary | ICD-10-CM | POA: Diagnosis not present

## 2021-03-16 DIAGNOSIS — N2589 Other disorders resulting from impaired renal tubular function: Secondary | ICD-10-CM | POA: Diagnosis not present

## 2021-03-16 DIAGNOSIS — N2581 Secondary hyperparathyroidism of renal origin: Secondary | ICD-10-CM | POA: Diagnosis not present

## 2021-03-16 DIAGNOSIS — E440 Moderate protein-calorie malnutrition: Secondary | ICD-10-CM | POA: Diagnosis not present

## 2021-03-17 DIAGNOSIS — Z79899 Other long term (current) drug therapy: Secondary | ICD-10-CM | POA: Diagnosis not present

## 2021-03-17 DIAGNOSIS — N2589 Other disorders resulting from impaired renal tubular function: Secondary | ICD-10-CM | POA: Diagnosis not present

## 2021-03-17 DIAGNOSIS — D631 Anemia in chronic kidney disease: Secondary | ICD-10-CM | POA: Diagnosis not present

## 2021-03-17 DIAGNOSIS — L97522 Non-pressure chronic ulcer of other part of left foot with fat layer exposed: Secondary | ICD-10-CM | POA: Diagnosis not present

## 2021-03-17 DIAGNOSIS — E440 Moderate protein-calorie malnutrition: Secondary | ICD-10-CM | POA: Diagnosis not present

## 2021-03-17 DIAGNOSIS — Z794 Long term (current) use of insulin: Secondary | ICD-10-CM | POA: Diagnosis not present

## 2021-03-17 DIAGNOSIS — R17 Unspecified jaundice: Secondary | ICD-10-CM | POA: Diagnosis not present

## 2021-03-17 DIAGNOSIS — L97521 Non-pressure chronic ulcer of other part of left foot limited to breakdown of skin: Secondary | ICD-10-CM | POA: Diagnosis not present

## 2021-03-17 DIAGNOSIS — E11621 Type 2 diabetes mellitus with foot ulcer: Secondary | ICD-10-CM | POA: Diagnosis not present

## 2021-03-17 DIAGNOSIS — N186 End stage renal disease: Secondary | ICD-10-CM | POA: Diagnosis not present

## 2021-03-17 DIAGNOSIS — Z992 Dependence on renal dialysis: Secondary | ICD-10-CM | POA: Diagnosis not present

## 2021-03-17 DIAGNOSIS — D509 Iron deficiency anemia, unspecified: Secondary | ICD-10-CM | POA: Diagnosis not present

## 2021-03-17 DIAGNOSIS — L97509 Non-pressure chronic ulcer of other part of unspecified foot with unspecified severity: Secondary | ICD-10-CM | POA: Diagnosis not present

## 2021-03-17 DIAGNOSIS — N2581 Secondary hyperparathyroidism of renal origin: Secondary | ICD-10-CM | POA: Diagnosis not present

## 2021-03-18 DIAGNOSIS — R17 Unspecified jaundice: Secondary | ICD-10-CM | POA: Diagnosis not present

## 2021-03-18 DIAGNOSIS — D631 Anemia in chronic kidney disease: Secondary | ICD-10-CM | POA: Diagnosis not present

## 2021-03-18 DIAGNOSIS — Z992 Dependence on renal dialysis: Secondary | ICD-10-CM | POA: Diagnosis not present

## 2021-03-18 DIAGNOSIS — D509 Iron deficiency anemia, unspecified: Secondary | ICD-10-CM | POA: Diagnosis not present

## 2021-03-18 DIAGNOSIS — N186 End stage renal disease: Secondary | ICD-10-CM | POA: Diagnosis not present

## 2021-03-18 DIAGNOSIS — E440 Moderate protein-calorie malnutrition: Secondary | ICD-10-CM | POA: Diagnosis not present

## 2021-03-18 DIAGNOSIS — N2589 Other disorders resulting from impaired renal tubular function: Secondary | ICD-10-CM | POA: Diagnosis not present

## 2021-03-18 DIAGNOSIS — Z79899 Other long term (current) drug therapy: Secondary | ICD-10-CM | POA: Diagnosis not present

## 2021-03-18 DIAGNOSIS — N2581 Secondary hyperparathyroidism of renal origin: Secondary | ICD-10-CM | POA: Diagnosis not present

## 2021-03-19 DIAGNOSIS — N186 End stage renal disease: Secondary | ICD-10-CM | POA: Diagnosis not present

## 2021-03-19 DIAGNOSIS — D509 Iron deficiency anemia, unspecified: Secondary | ICD-10-CM | POA: Diagnosis not present

## 2021-03-19 DIAGNOSIS — N2589 Other disorders resulting from impaired renal tubular function: Secondary | ICD-10-CM | POA: Diagnosis not present

## 2021-03-19 DIAGNOSIS — Z992 Dependence on renal dialysis: Secondary | ICD-10-CM | POA: Diagnosis not present

## 2021-03-19 DIAGNOSIS — Z79899 Other long term (current) drug therapy: Secondary | ICD-10-CM | POA: Diagnosis not present

## 2021-03-19 DIAGNOSIS — E440 Moderate protein-calorie malnutrition: Secondary | ICD-10-CM | POA: Diagnosis not present

## 2021-03-19 DIAGNOSIS — N2581 Secondary hyperparathyroidism of renal origin: Secondary | ICD-10-CM | POA: Diagnosis not present

## 2021-03-19 DIAGNOSIS — D631 Anemia in chronic kidney disease: Secondary | ICD-10-CM | POA: Diagnosis not present

## 2021-03-19 DIAGNOSIS — R17 Unspecified jaundice: Secondary | ICD-10-CM | POA: Diagnosis not present

## 2021-03-20 DIAGNOSIS — R17 Unspecified jaundice: Secondary | ICD-10-CM | POA: Diagnosis not present

## 2021-03-20 DIAGNOSIS — N2581 Secondary hyperparathyroidism of renal origin: Secondary | ICD-10-CM | POA: Diagnosis not present

## 2021-03-20 DIAGNOSIS — Z992 Dependence on renal dialysis: Secondary | ICD-10-CM | POA: Diagnosis not present

## 2021-03-20 DIAGNOSIS — D509 Iron deficiency anemia, unspecified: Secondary | ICD-10-CM | POA: Diagnosis not present

## 2021-03-20 DIAGNOSIS — N186 End stage renal disease: Secondary | ICD-10-CM | POA: Diagnosis not present

## 2021-03-20 DIAGNOSIS — Z79899 Other long term (current) drug therapy: Secondary | ICD-10-CM | POA: Diagnosis not present

## 2021-03-20 DIAGNOSIS — E440 Moderate protein-calorie malnutrition: Secondary | ICD-10-CM | POA: Diagnosis not present

## 2021-03-20 DIAGNOSIS — D631 Anemia in chronic kidney disease: Secondary | ICD-10-CM | POA: Diagnosis not present

## 2021-03-20 DIAGNOSIS — N2589 Other disorders resulting from impaired renal tubular function: Secondary | ICD-10-CM | POA: Diagnosis not present

## 2021-03-21 DIAGNOSIS — Z992 Dependence on renal dialysis: Secondary | ICD-10-CM | POA: Diagnosis not present

## 2021-03-21 DIAGNOSIS — N186 End stage renal disease: Secondary | ICD-10-CM | POA: Diagnosis not present

## 2021-03-21 DIAGNOSIS — R17 Unspecified jaundice: Secondary | ICD-10-CM | POA: Diagnosis not present

## 2021-03-21 DIAGNOSIS — N2581 Secondary hyperparathyroidism of renal origin: Secondary | ICD-10-CM | POA: Diagnosis not present

## 2021-03-21 DIAGNOSIS — D509 Iron deficiency anemia, unspecified: Secondary | ICD-10-CM | POA: Diagnosis not present

## 2021-03-21 DIAGNOSIS — N2589 Other disorders resulting from impaired renal tubular function: Secondary | ICD-10-CM | POA: Diagnosis not present

## 2021-03-21 DIAGNOSIS — Z79899 Other long term (current) drug therapy: Secondary | ICD-10-CM | POA: Diagnosis not present

## 2021-03-21 DIAGNOSIS — E440 Moderate protein-calorie malnutrition: Secondary | ICD-10-CM | POA: Diagnosis not present

## 2021-03-21 DIAGNOSIS — D631 Anemia in chronic kidney disease: Secondary | ICD-10-CM | POA: Diagnosis not present

## 2021-03-22 DIAGNOSIS — E440 Moderate protein-calorie malnutrition: Secondary | ICD-10-CM | POA: Diagnosis not present

## 2021-03-22 DIAGNOSIS — D631 Anemia in chronic kidney disease: Secondary | ICD-10-CM | POA: Diagnosis not present

## 2021-03-22 DIAGNOSIS — N2581 Secondary hyperparathyroidism of renal origin: Secondary | ICD-10-CM | POA: Diagnosis not present

## 2021-03-22 DIAGNOSIS — D509 Iron deficiency anemia, unspecified: Secondary | ICD-10-CM | POA: Diagnosis not present

## 2021-03-22 DIAGNOSIS — Z79899 Other long term (current) drug therapy: Secondary | ICD-10-CM | POA: Diagnosis not present

## 2021-03-22 DIAGNOSIS — N2589 Other disorders resulting from impaired renal tubular function: Secondary | ICD-10-CM | POA: Diagnosis not present

## 2021-03-22 DIAGNOSIS — Z992 Dependence on renal dialysis: Secondary | ICD-10-CM | POA: Diagnosis not present

## 2021-03-22 DIAGNOSIS — R17 Unspecified jaundice: Secondary | ICD-10-CM | POA: Diagnosis not present

## 2021-03-22 DIAGNOSIS — N186 End stage renal disease: Secondary | ICD-10-CM | POA: Diagnosis not present

## 2021-03-23 DIAGNOSIS — N2589 Other disorders resulting from impaired renal tubular function: Secondary | ICD-10-CM | POA: Diagnosis not present

## 2021-03-23 DIAGNOSIS — Z992 Dependence on renal dialysis: Secondary | ICD-10-CM | POA: Diagnosis not present

## 2021-03-23 DIAGNOSIS — D631 Anemia in chronic kidney disease: Secondary | ICD-10-CM | POA: Diagnosis not present

## 2021-03-23 DIAGNOSIS — N2581 Secondary hyperparathyroidism of renal origin: Secondary | ICD-10-CM | POA: Diagnosis not present

## 2021-03-23 DIAGNOSIS — R17 Unspecified jaundice: Secondary | ICD-10-CM | POA: Diagnosis not present

## 2021-03-23 DIAGNOSIS — N186 End stage renal disease: Secondary | ICD-10-CM | POA: Diagnosis not present

## 2021-03-23 DIAGNOSIS — Z79899 Other long term (current) drug therapy: Secondary | ICD-10-CM | POA: Diagnosis not present

## 2021-03-23 DIAGNOSIS — E440 Moderate protein-calorie malnutrition: Secondary | ICD-10-CM | POA: Diagnosis not present

## 2021-03-23 DIAGNOSIS — D509 Iron deficiency anemia, unspecified: Secondary | ICD-10-CM | POA: Diagnosis not present

## 2021-03-24 DIAGNOSIS — R17 Unspecified jaundice: Secondary | ICD-10-CM | POA: Diagnosis not present

## 2021-03-24 DIAGNOSIS — Z992 Dependence on renal dialysis: Secondary | ICD-10-CM | POA: Diagnosis not present

## 2021-03-24 DIAGNOSIS — Z794 Long term (current) use of insulin: Secondary | ICD-10-CM | POA: Diagnosis not present

## 2021-03-24 DIAGNOSIS — Z79899 Other long term (current) drug therapy: Secondary | ICD-10-CM | POA: Diagnosis not present

## 2021-03-24 DIAGNOSIS — D509 Iron deficiency anemia, unspecified: Secondary | ICD-10-CM | POA: Diagnosis not present

## 2021-03-24 DIAGNOSIS — E440 Moderate protein-calorie malnutrition: Secondary | ICD-10-CM | POA: Diagnosis not present

## 2021-03-24 DIAGNOSIS — N2581 Secondary hyperparathyroidism of renal origin: Secondary | ICD-10-CM | POA: Diagnosis not present

## 2021-03-24 DIAGNOSIS — N2589 Other disorders resulting from impaired renal tubular function: Secondary | ICD-10-CM | POA: Diagnosis not present

## 2021-03-24 DIAGNOSIS — E11621 Type 2 diabetes mellitus with foot ulcer: Secondary | ICD-10-CM | POA: Diagnosis not present

## 2021-03-24 DIAGNOSIS — N186 End stage renal disease: Secondary | ICD-10-CM | POA: Diagnosis not present

## 2021-03-24 DIAGNOSIS — D631 Anemia in chronic kidney disease: Secondary | ICD-10-CM | POA: Diagnosis not present

## 2021-03-24 DIAGNOSIS — L97522 Non-pressure chronic ulcer of other part of left foot with fat layer exposed: Secondary | ICD-10-CM | POA: Diagnosis not present

## 2021-03-25 DIAGNOSIS — N2589 Other disorders resulting from impaired renal tubular function: Secondary | ICD-10-CM | POA: Diagnosis not present

## 2021-03-25 DIAGNOSIS — Z79899 Other long term (current) drug therapy: Secondary | ICD-10-CM | POA: Diagnosis not present

## 2021-03-25 DIAGNOSIS — D631 Anemia in chronic kidney disease: Secondary | ICD-10-CM | POA: Diagnosis not present

## 2021-03-25 DIAGNOSIS — E440 Moderate protein-calorie malnutrition: Secondary | ICD-10-CM | POA: Diagnosis not present

## 2021-03-25 DIAGNOSIS — N2581 Secondary hyperparathyroidism of renal origin: Secondary | ICD-10-CM | POA: Diagnosis not present

## 2021-03-25 DIAGNOSIS — D509 Iron deficiency anemia, unspecified: Secondary | ICD-10-CM | POA: Diagnosis not present

## 2021-03-25 DIAGNOSIS — Z992 Dependence on renal dialysis: Secondary | ICD-10-CM | POA: Diagnosis not present

## 2021-03-25 DIAGNOSIS — N186 End stage renal disease: Secondary | ICD-10-CM | POA: Diagnosis not present

## 2021-03-25 DIAGNOSIS — R17 Unspecified jaundice: Secondary | ICD-10-CM | POA: Diagnosis not present

## 2021-03-26 DIAGNOSIS — Z79899 Other long term (current) drug therapy: Secondary | ICD-10-CM | POA: Diagnosis not present

## 2021-03-26 DIAGNOSIS — N2589 Other disorders resulting from impaired renal tubular function: Secondary | ICD-10-CM | POA: Diagnosis not present

## 2021-03-26 DIAGNOSIS — D631 Anemia in chronic kidney disease: Secondary | ICD-10-CM | POA: Diagnosis not present

## 2021-03-26 DIAGNOSIS — R17 Unspecified jaundice: Secondary | ICD-10-CM | POA: Diagnosis not present

## 2021-03-26 DIAGNOSIS — Z992 Dependence on renal dialysis: Secondary | ICD-10-CM | POA: Diagnosis not present

## 2021-03-26 DIAGNOSIS — N186 End stage renal disease: Secondary | ICD-10-CM | POA: Diagnosis not present

## 2021-03-26 DIAGNOSIS — E440 Moderate protein-calorie malnutrition: Secondary | ICD-10-CM | POA: Diagnosis not present

## 2021-03-26 DIAGNOSIS — N2581 Secondary hyperparathyroidism of renal origin: Secondary | ICD-10-CM | POA: Diagnosis not present

## 2021-03-26 DIAGNOSIS — D509 Iron deficiency anemia, unspecified: Secondary | ICD-10-CM | POA: Diagnosis not present

## 2021-03-27 DIAGNOSIS — N2581 Secondary hyperparathyroidism of renal origin: Secondary | ICD-10-CM | POA: Diagnosis not present

## 2021-03-27 DIAGNOSIS — Z79899 Other long term (current) drug therapy: Secondary | ICD-10-CM | POA: Diagnosis not present

## 2021-03-27 DIAGNOSIS — Z992 Dependence on renal dialysis: Secondary | ICD-10-CM | POA: Diagnosis not present

## 2021-03-27 DIAGNOSIS — D509 Iron deficiency anemia, unspecified: Secondary | ICD-10-CM | POA: Diagnosis not present

## 2021-03-27 DIAGNOSIS — E440 Moderate protein-calorie malnutrition: Secondary | ICD-10-CM | POA: Diagnosis not present

## 2021-03-27 DIAGNOSIS — R17 Unspecified jaundice: Secondary | ICD-10-CM | POA: Diagnosis not present

## 2021-03-27 DIAGNOSIS — N2589 Other disorders resulting from impaired renal tubular function: Secondary | ICD-10-CM | POA: Diagnosis not present

## 2021-03-27 DIAGNOSIS — D631 Anemia in chronic kidney disease: Secondary | ICD-10-CM | POA: Diagnosis not present

## 2021-03-27 DIAGNOSIS — N186 End stage renal disease: Secondary | ICD-10-CM | POA: Diagnosis not present

## 2021-03-28 DIAGNOSIS — E440 Moderate protein-calorie malnutrition: Secondary | ICD-10-CM | POA: Diagnosis not present

## 2021-03-28 DIAGNOSIS — D509 Iron deficiency anemia, unspecified: Secondary | ICD-10-CM | POA: Diagnosis not present

## 2021-03-28 DIAGNOSIS — D631 Anemia in chronic kidney disease: Secondary | ICD-10-CM | POA: Diagnosis not present

## 2021-03-28 DIAGNOSIS — N186 End stage renal disease: Secondary | ICD-10-CM | POA: Diagnosis not present

## 2021-03-28 DIAGNOSIS — Z79899 Other long term (current) drug therapy: Secondary | ICD-10-CM | POA: Diagnosis not present

## 2021-03-28 DIAGNOSIS — R17 Unspecified jaundice: Secondary | ICD-10-CM | POA: Diagnosis not present

## 2021-03-28 DIAGNOSIS — Z992 Dependence on renal dialysis: Secondary | ICD-10-CM | POA: Diagnosis not present

## 2021-03-28 DIAGNOSIS — N2589 Other disorders resulting from impaired renal tubular function: Secondary | ICD-10-CM | POA: Diagnosis not present

## 2021-03-28 DIAGNOSIS — N2581 Secondary hyperparathyroidism of renal origin: Secondary | ICD-10-CM | POA: Diagnosis not present

## 2021-03-29 DIAGNOSIS — D509 Iron deficiency anemia, unspecified: Secondary | ICD-10-CM | POA: Diagnosis not present

## 2021-03-29 DIAGNOSIS — Z992 Dependence on renal dialysis: Secondary | ICD-10-CM | POA: Diagnosis not present

## 2021-03-29 DIAGNOSIS — N2589 Other disorders resulting from impaired renal tubular function: Secondary | ICD-10-CM | POA: Diagnosis not present

## 2021-03-29 DIAGNOSIS — R17 Unspecified jaundice: Secondary | ICD-10-CM | POA: Diagnosis not present

## 2021-03-29 DIAGNOSIS — Z79899 Other long term (current) drug therapy: Secondary | ICD-10-CM | POA: Diagnosis not present

## 2021-03-29 DIAGNOSIS — N186 End stage renal disease: Secondary | ICD-10-CM | POA: Diagnosis not present

## 2021-03-29 DIAGNOSIS — D631 Anemia in chronic kidney disease: Secondary | ICD-10-CM | POA: Diagnosis not present

## 2021-03-29 DIAGNOSIS — E440 Moderate protein-calorie malnutrition: Secondary | ICD-10-CM | POA: Diagnosis not present

## 2021-03-29 DIAGNOSIS — N2581 Secondary hyperparathyroidism of renal origin: Secondary | ICD-10-CM | POA: Diagnosis not present

## 2021-03-30 DIAGNOSIS — D631 Anemia in chronic kidney disease: Secondary | ICD-10-CM | POA: Diagnosis not present

## 2021-03-30 DIAGNOSIS — N2589 Other disorders resulting from impaired renal tubular function: Secondary | ICD-10-CM | POA: Diagnosis not present

## 2021-03-30 DIAGNOSIS — E440 Moderate protein-calorie malnutrition: Secondary | ICD-10-CM | POA: Diagnosis not present

## 2021-03-30 DIAGNOSIS — N186 End stage renal disease: Secondary | ICD-10-CM | POA: Diagnosis not present

## 2021-03-30 DIAGNOSIS — Z79899 Other long term (current) drug therapy: Secondary | ICD-10-CM | POA: Diagnosis not present

## 2021-03-30 DIAGNOSIS — N2581 Secondary hyperparathyroidism of renal origin: Secondary | ICD-10-CM | POA: Diagnosis not present

## 2021-03-30 DIAGNOSIS — D509 Iron deficiency anemia, unspecified: Secondary | ICD-10-CM | POA: Diagnosis not present

## 2021-03-30 DIAGNOSIS — R17 Unspecified jaundice: Secondary | ICD-10-CM | POA: Diagnosis not present

## 2021-03-30 DIAGNOSIS — Z992 Dependence on renal dialysis: Secondary | ICD-10-CM | POA: Diagnosis not present

## 2021-03-31 DIAGNOSIS — L97509 Non-pressure chronic ulcer of other part of unspecified foot with unspecified severity: Secondary | ICD-10-CM | POA: Diagnosis not present

## 2021-03-31 DIAGNOSIS — E11621 Type 2 diabetes mellitus with foot ulcer: Secondary | ICD-10-CM | POA: Diagnosis not present

## 2021-03-31 DIAGNOSIS — L97522 Non-pressure chronic ulcer of other part of left foot with fat layer exposed: Secondary | ICD-10-CM | POA: Diagnosis not present

## 2021-03-31 DIAGNOSIS — N2581 Secondary hyperparathyroidism of renal origin: Secondary | ICD-10-CM | POA: Diagnosis not present

## 2021-03-31 DIAGNOSIS — D631 Anemia in chronic kidney disease: Secondary | ICD-10-CM | POA: Diagnosis not present

## 2021-03-31 DIAGNOSIS — Z79899 Other long term (current) drug therapy: Secondary | ICD-10-CM | POA: Diagnosis not present

## 2021-03-31 DIAGNOSIS — Z794 Long term (current) use of insulin: Secondary | ICD-10-CM | POA: Diagnosis not present

## 2021-03-31 DIAGNOSIS — E440 Moderate protein-calorie malnutrition: Secondary | ICD-10-CM | POA: Diagnosis not present

## 2021-03-31 DIAGNOSIS — N186 End stage renal disease: Secondary | ICD-10-CM | POA: Diagnosis not present

## 2021-03-31 DIAGNOSIS — D509 Iron deficiency anemia, unspecified: Secondary | ICD-10-CM | POA: Diagnosis not present

## 2021-03-31 DIAGNOSIS — Z992 Dependence on renal dialysis: Secondary | ICD-10-CM | POA: Diagnosis not present

## 2021-03-31 DIAGNOSIS — R17 Unspecified jaundice: Secondary | ICD-10-CM | POA: Diagnosis not present

## 2021-03-31 DIAGNOSIS — N2589 Other disorders resulting from impaired renal tubular function: Secondary | ICD-10-CM | POA: Diagnosis not present

## 2021-04-01 DIAGNOSIS — Z992 Dependence on renal dialysis: Secondary | ICD-10-CM | POA: Diagnosis not present

## 2021-04-01 DIAGNOSIS — N2581 Secondary hyperparathyroidism of renal origin: Secondary | ICD-10-CM | POA: Diagnosis not present

## 2021-04-01 DIAGNOSIS — N2589 Other disorders resulting from impaired renal tubular function: Secondary | ICD-10-CM | POA: Diagnosis not present

## 2021-04-01 DIAGNOSIS — E440 Moderate protein-calorie malnutrition: Secondary | ICD-10-CM | POA: Diagnosis not present

## 2021-04-01 DIAGNOSIS — D631 Anemia in chronic kidney disease: Secondary | ICD-10-CM | POA: Diagnosis not present

## 2021-04-01 DIAGNOSIS — N186 End stage renal disease: Secondary | ICD-10-CM | POA: Diagnosis not present

## 2021-04-01 DIAGNOSIS — D509 Iron deficiency anemia, unspecified: Secondary | ICD-10-CM | POA: Diagnosis not present

## 2021-04-01 DIAGNOSIS — Z79899 Other long term (current) drug therapy: Secondary | ICD-10-CM | POA: Diagnosis not present

## 2021-04-01 DIAGNOSIS — R17 Unspecified jaundice: Secondary | ICD-10-CM | POA: Diagnosis not present

## 2021-04-02 DIAGNOSIS — N2589 Other disorders resulting from impaired renal tubular function: Secondary | ICD-10-CM | POA: Diagnosis not present

## 2021-04-02 DIAGNOSIS — R17 Unspecified jaundice: Secondary | ICD-10-CM | POA: Diagnosis not present

## 2021-04-02 DIAGNOSIS — D509 Iron deficiency anemia, unspecified: Secondary | ICD-10-CM | POA: Diagnosis not present

## 2021-04-02 DIAGNOSIS — N186 End stage renal disease: Secondary | ICD-10-CM | POA: Diagnosis not present

## 2021-04-02 DIAGNOSIS — D631 Anemia in chronic kidney disease: Secondary | ICD-10-CM | POA: Diagnosis not present

## 2021-04-02 DIAGNOSIS — N2581 Secondary hyperparathyroidism of renal origin: Secondary | ICD-10-CM | POA: Diagnosis not present

## 2021-04-02 DIAGNOSIS — Z79899 Other long term (current) drug therapy: Secondary | ICD-10-CM | POA: Diagnosis not present

## 2021-04-02 DIAGNOSIS — E440 Moderate protein-calorie malnutrition: Secondary | ICD-10-CM | POA: Diagnosis not present

## 2021-04-02 DIAGNOSIS — Z992 Dependence on renal dialysis: Secondary | ICD-10-CM | POA: Diagnosis not present

## 2021-04-03 DIAGNOSIS — E440 Moderate protein-calorie malnutrition: Secondary | ICD-10-CM | POA: Diagnosis not present

## 2021-04-03 DIAGNOSIS — N2589 Other disorders resulting from impaired renal tubular function: Secondary | ICD-10-CM | POA: Diagnosis not present

## 2021-04-03 DIAGNOSIS — Z79899 Other long term (current) drug therapy: Secondary | ICD-10-CM | POA: Diagnosis not present

## 2021-04-03 DIAGNOSIS — N2581 Secondary hyperparathyroidism of renal origin: Secondary | ICD-10-CM | POA: Diagnosis not present

## 2021-04-03 DIAGNOSIS — N186 End stage renal disease: Secondary | ICD-10-CM | POA: Diagnosis not present

## 2021-04-03 DIAGNOSIS — Z992 Dependence on renal dialysis: Secondary | ICD-10-CM | POA: Diagnosis not present

## 2021-04-03 DIAGNOSIS — D631 Anemia in chronic kidney disease: Secondary | ICD-10-CM | POA: Diagnosis not present

## 2021-04-03 DIAGNOSIS — D509 Iron deficiency anemia, unspecified: Secondary | ICD-10-CM | POA: Diagnosis not present

## 2021-04-03 DIAGNOSIS — R17 Unspecified jaundice: Secondary | ICD-10-CM | POA: Diagnosis not present

## 2021-04-04 DIAGNOSIS — Z79899 Other long term (current) drug therapy: Secondary | ICD-10-CM | POA: Diagnosis not present

## 2021-04-04 DIAGNOSIS — N2589 Other disorders resulting from impaired renal tubular function: Secondary | ICD-10-CM | POA: Diagnosis not present

## 2021-04-04 DIAGNOSIS — R17 Unspecified jaundice: Secondary | ICD-10-CM | POA: Diagnosis not present

## 2021-04-04 DIAGNOSIS — N2581 Secondary hyperparathyroidism of renal origin: Secondary | ICD-10-CM | POA: Diagnosis not present

## 2021-04-04 DIAGNOSIS — D631 Anemia in chronic kidney disease: Secondary | ICD-10-CM | POA: Diagnosis not present

## 2021-04-04 DIAGNOSIS — N186 End stage renal disease: Secondary | ICD-10-CM | POA: Diagnosis not present

## 2021-04-04 DIAGNOSIS — Z992 Dependence on renal dialysis: Secondary | ICD-10-CM | POA: Diagnosis not present

## 2021-04-04 DIAGNOSIS — E440 Moderate protein-calorie malnutrition: Secondary | ICD-10-CM | POA: Diagnosis not present

## 2021-04-04 DIAGNOSIS — D509 Iron deficiency anemia, unspecified: Secondary | ICD-10-CM | POA: Diagnosis not present

## 2021-04-05 DIAGNOSIS — R17 Unspecified jaundice: Secondary | ICD-10-CM | POA: Diagnosis not present

## 2021-04-05 DIAGNOSIS — D509 Iron deficiency anemia, unspecified: Secondary | ICD-10-CM | POA: Diagnosis not present

## 2021-04-05 DIAGNOSIS — N2589 Other disorders resulting from impaired renal tubular function: Secondary | ICD-10-CM | POA: Diagnosis not present

## 2021-04-05 DIAGNOSIS — D631 Anemia in chronic kidney disease: Secondary | ICD-10-CM | POA: Diagnosis not present

## 2021-04-05 DIAGNOSIS — Z992 Dependence on renal dialysis: Secondary | ICD-10-CM | POA: Diagnosis not present

## 2021-04-05 DIAGNOSIS — E440 Moderate protein-calorie malnutrition: Secondary | ICD-10-CM | POA: Diagnosis not present

## 2021-04-05 DIAGNOSIS — N2581 Secondary hyperparathyroidism of renal origin: Secondary | ICD-10-CM | POA: Diagnosis not present

## 2021-04-05 DIAGNOSIS — N186 End stage renal disease: Secondary | ICD-10-CM | POA: Diagnosis not present

## 2021-04-05 DIAGNOSIS — Z79899 Other long term (current) drug therapy: Secondary | ICD-10-CM | POA: Diagnosis not present

## 2021-04-06 DIAGNOSIS — R17 Unspecified jaundice: Secondary | ICD-10-CM | POA: Diagnosis not present

## 2021-04-06 DIAGNOSIS — N186 End stage renal disease: Secondary | ICD-10-CM | POA: Diagnosis not present

## 2021-04-06 DIAGNOSIS — Z79899 Other long term (current) drug therapy: Secondary | ICD-10-CM | POA: Diagnosis not present

## 2021-04-06 DIAGNOSIS — D631 Anemia in chronic kidney disease: Secondary | ICD-10-CM | POA: Diagnosis not present

## 2021-04-06 DIAGNOSIS — N2581 Secondary hyperparathyroidism of renal origin: Secondary | ICD-10-CM | POA: Diagnosis not present

## 2021-04-06 DIAGNOSIS — D509 Iron deficiency anemia, unspecified: Secondary | ICD-10-CM | POA: Diagnosis not present

## 2021-04-06 DIAGNOSIS — N2589 Other disorders resulting from impaired renal tubular function: Secondary | ICD-10-CM | POA: Diagnosis not present

## 2021-04-06 DIAGNOSIS — Z992 Dependence on renal dialysis: Secondary | ICD-10-CM | POA: Diagnosis not present

## 2021-04-06 DIAGNOSIS — E440 Moderate protein-calorie malnutrition: Secondary | ICD-10-CM | POA: Diagnosis not present

## 2021-04-07 DIAGNOSIS — Z794 Long term (current) use of insulin: Secondary | ICD-10-CM | POA: Diagnosis not present

## 2021-04-07 DIAGNOSIS — D631 Anemia in chronic kidney disease: Secondary | ICD-10-CM | POA: Diagnosis not present

## 2021-04-07 DIAGNOSIS — N186 End stage renal disease: Secondary | ICD-10-CM | POA: Diagnosis not present

## 2021-04-07 DIAGNOSIS — D509 Iron deficiency anemia, unspecified: Secondary | ICD-10-CM | POA: Diagnosis not present

## 2021-04-07 DIAGNOSIS — L97522 Non-pressure chronic ulcer of other part of left foot with fat layer exposed: Secondary | ICD-10-CM | POA: Diagnosis not present

## 2021-04-07 DIAGNOSIS — E440 Moderate protein-calorie malnutrition: Secondary | ICD-10-CM | POA: Diagnosis not present

## 2021-04-07 DIAGNOSIS — E1122 Type 2 diabetes mellitus with diabetic chronic kidney disease: Secondary | ICD-10-CM | POA: Diagnosis not present

## 2021-04-07 DIAGNOSIS — Z79899 Other long term (current) drug therapy: Secondary | ICD-10-CM | POA: Diagnosis not present

## 2021-04-07 DIAGNOSIS — N2589 Other disorders resulting from impaired renal tubular function: Secondary | ICD-10-CM | POA: Diagnosis not present

## 2021-04-07 DIAGNOSIS — N2581 Secondary hyperparathyroidism of renal origin: Secondary | ICD-10-CM | POA: Diagnosis not present

## 2021-04-07 DIAGNOSIS — Z992 Dependence on renal dialysis: Secondary | ICD-10-CM | POA: Diagnosis not present

## 2021-04-07 DIAGNOSIS — E11621 Type 2 diabetes mellitus with foot ulcer: Secondary | ICD-10-CM | POA: Diagnosis not present

## 2021-04-07 DIAGNOSIS — R17 Unspecified jaundice: Secondary | ICD-10-CM | POA: Diagnosis not present

## 2021-04-08 DIAGNOSIS — E1122 Type 2 diabetes mellitus with diabetic chronic kidney disease: Secondary | ICD-10-CM | POA: Diagnosis not present

## 2021-04-08 DIAGNOSIS — Z992 Dependence on renal dialysis: Secondary | ICD-10-CM | POA: Diagnosis not present

## 2021-04-08 DIAGNOSIS — N2581 Secondary hyperparathyroidism of renal origin: Secondary | ICD-10-CM | POA: Diagnosis not present

## 2021-04-08 DIAGNOSIS — K769 Liver disease, unspecified: Secondary | ICD-10-CM | POA: Diagnosis not present

## 2021-04-08 DIAGNOSIS — D509 Iron deficiency anemia, unspecified: Secondary | ICD-10-CM | POA: Diagnosis not present

## 2021-04-08 DIAGNOSIS — N186 End stage renal disease: Secondary | ICD-10-CM | POA: Diagnosis not present

## 2021-04-08 DIAGNOSIS — N2589 Other disorders resulting from impaired renal tubular function: Secondary | ICD-10-CM | POA: Diagnosis not present

## 2021-04-08 DIAGNOSIS — D631 Anemia in chronic kidney disease: Secondary | ICD-10-CM | POA: Diagnosis not present

## 2021-04-09 DIAGNOSIS — E1122 Type 2 diabetes mellitus with diabetic chronic kidney disease: Secondary | ICD-10-CM | POA: Diagnosis not present

## 2021-04-09 DIAGNOSIS — N186 End stage renal disease: Secondary | ICD-10-CM | POA: Diagnosis not present

## 2021-04-09 DIAGNOSIS — D631 Anemia in chronic kidney disease: Secondary | ICD-10-CM | POA: Diagnosis not present

## 2021-04-09 DIAGNOSIS — N2581 Secondary hyperparathyroidism of renal origin: Secondary | ICD-10-CM | POA: Diagnosis not present

## 2021-04-09 DIAGNOSIS — K769 Liver disease, unspecified: Secondary | ICD-10-CM | POA: Diagnosis not present

## 2021-04-09 DIAGNOSIS — D509 Iron deficiency anemia, unspecified: Secondary | ICD-10-CM | POA: Diagnosis not present

## 2021-04-09 DIAGNOSIS — Z992 Dependence on renal dialysis: Secondary | ICD-10-CM | POA: Diagnosis not present

## 2021-04-09 DIAGNOSIS — N2589 Other disorders resulting from impaired renal tubular function: Secondary | ICD-10-CM | POA: Diagnosis not present

## 2021-04-10 DIAGNOSIS — D509 Iron deficiency anemia, unspecified: Secondary | ICD-10-CM | POA: Diagnosis not present

## 2021-04-10 DIAGNOSIS — E1122 Type 2 diabetes mellitus with diabetic chronic kidney disease: Secondary | ICD-10-CM | POA: Diagnosis not present

## 2021-04-10 DIAGNOSIS — N2581 Secondary hyperparathyroidism of renal origin: Secondary | ICD-10-CM | POA: Diagnosis not present

## 2021-04-10 DIAGNOSIS — D631 Anemia in chronic kidney disease: Secondary | ICD-10-CM | POA: Diagnosis not present

## 2021-04-10 DIAGNOSIS — K769 Liver disease, unspecified: Secondary | ICD-10-CM | POA: Diagnosis not present

## 2021-04-10 DIAGNOSIS — Z992 Dependence on renal dialysis: Secondary | ICD-10-CM | POA: Diagnosis not present

## 2021-04-10 DIAGNOSIS — N186 End stage renal disease: Secondary | ICD-10-CM | POA: Diagnosis not present

## 2021-04-10 DIAGNOSIS — N2589 Other disorders resulting from impaired renal tubular function: Secondary | ICD-10-CM | POA: Diagnosis not present

## 2021-04-11 DIAGNOSIS — N186 End stage renal disease: Secondary | ICD-10-CM | POA: Diagnosis not present

## 2021-04-11 DIAGNOSIS — K769 Liver disease, unspecified: Secondary | ICD-10-CM | POA: Diagnosis not present

## 2021-04-11 DIAGNOSIS — N2589 Other disorders resulting from impaired renal tubular function: Secondary | ICD-10-CM | POA: Diagnosis not present

## 2021-04-11 DIAGNOSIS — E1122 Type 2 diabetes mellitus with diabetic chronic kidney disease: Secondary | ICD-10-CM | POA: Diagnosis not present

## 2021-04-11 DIAGNOSIS — N2581 Secondary hyperparathyroidism of renal origin: Secondary | ICD-10-CM | POA: Diagnosis not present

## 2021-04-11 DIAGNOSIS — D509 Iron deficiency anemia, unspecified: Secondary | ICD-10-CM | POA: Diagnosis not present

## 2021-04-11 DIAGNOSIS — Z992 Dependence on renal dialysis: Secondary | ICD-10-CM | POA: Diagnosis not present

## 2021-04-11 DIAGNOSIS — D631 Anemia in chronic kidney disease: Secondary | ICD-10-CM | POA: Diagnosis not present

## 2021-04-12 DIAGNOSIS — D509 Iron deficiency anemia, unspecified: Secondary | ICD-10-CM | POA: Diagnosis not present

## 2021-04-12 DIAGNOSIS — Z992 Dependence on renal dialysis: Secondary | ICD-10-CM | POA: Diagnosis not present

## 2021-04-12 DIAGNOSIS — N2581 Secondary hyperparathyroidism of renal origin: Secondary | ICD-10-CM | POA: Diagnosis not present

## 2021-04-12 DIAGNOSIS — E1122 Type 2 diabetes mellitus with diabetic chronic kidney disease: Secondary | ICD-10-CM | POA: Diagnosis not present

## 2021-04-12 DIAGNOSIS — N2589 Other disorders resulting from impaired renal tubular function: Secondary | ICD-10-CM | POA: Diagnosis not present

## 2021-04-12 DIAGNOSIS — K769 Liver disease, unspecified: Secondary | ICD-10-CM | POA: Diagnosis not present

## 2021-04-12 DIAGNOSIS — D631 Anemia in chronic kidney disease: Secondary | ICD-10-CM | POA: Diagnosis not present

## 2021-04-12 DIAGNOSIS — N186 End stage renal disease: Secondary | ICD-10-CM | POA: Diagnosis not present

## 2021-04-13 DIAGNOSIS — D509 Iron deficiency anemia, unspecified: Secondary | ICD-10-CM | POA: Diagnosis not present

## 2021-04-13 DIAGNOSIS — N2589 Other disorders resulting from impaired renal tubular function: Secondary | ICD-10-CM | POA: Diagnosis not present

## 2021-04-13 DIAGNOSIS — Z992 Dependence on renal dialysis: Secondary | ICD-10-CM | POA: Diagnosis not present

## 2021-04-13 DIAGNOSIS — K769 Liver disease, unspecified: Secondary | ICD-10-CM | POA: Diagnosis not present

## 2021-04-13 DIAGNOSIS — N2581 Secondary hyperparathyroidism of renal origin: Secondary | ICD-10-CM | POA: Diagnosis not present

## 2021-04-13 DIAGNOSIS — E1122 Type 2 diabetes mellitus with diabetic chronic kidney disease: Secondary | ICD-10-CM | POA: Diagnosis not present

## 2021-04-13 DIAGNOSIS — N186 End stage renal disease: Secondary | ICD-10-CM | POA: Diagnosis not present

## 2021-04-13 DIAGNOSIS — D631 Anemia in chronic kidney disease: Secondary | ICD-10-CM | POA: Diagnosis not present

## 2021-04-14 DIAGNOSIS — L97522 Non-pressure chronic ulcer of other part of left foot with fat layer exposed: Secondary | ICD-10-CM | POA: Diagnosis not present

## 2021-04-14 DIAGNOSIS — E1122 Type 2 diabetes mellitus with diabetic chronic kidney disease: Secondary | ICD-10-CM | POA: Diagnosis not present

## 2021-04-14 DIAGNOSIS — N2589 Other disorders resulting from impaired renal tubular function: Secondary | ICD-10-CM | POA: Diagnosis not present

## 2021-04-14 DIAGNOSIS — K769 Liver disease, unspecified: Secondary | ICD-10-CM | POA: Diagnosis not present

## 2021-04-14 DIAGNOSIS — N2581 Secondary hyperparathyroidism of renal origin: Secondary | ICD-10-CM | POA: Diagnosis not present

## 2021-04-14 DIAGNOSIS — Z794 Long term (current) use of insulin: Secondary | ICD-10-CM | POA: Diagnosis not present

## 2021-04-14 DIAGNOSIS — Z992 Dependence on renal dialysis: Secondary | ICD-10-CM | POA: Diagnosis not present

## 2021-04-14 DIAGNOSIS — N186 End stage renal disease: Secondary | ICD-10-CM | POA: Diagnosis not present

## 2021-04-14 DIAGNOSIS — D509 Iron deficiency anemia, unspecified: Secondary | ICD-10-CM | POA: Diagnosis not present

## 2021-04-14 DIAGNOSIS — D631 Anemia in chronic kidney disease: Secondary | ICD-10-CM | POA: Diagnosis not present

## 2021-04-14 DIAGNOSIS — E11621 Type 2 diabetes mellitus with foot ulcer: Secondary | ICD-10-CM | POA: Diagnosis not present

## 2021-04-15 DIAGNOSIS — D509 Iron deficiency anemia, unspecified: Secondary | ICD-10-CM | POA: Diagnosis not present

## 2021-04-15 DIAGNOSIS — K769 Liver disease, unspecified: Secondary | ICD-10-CM | POA: Diagnosis not present

## 2021-04-15 DIAGNOSIS — N2581 Secondary hyperparathyroidism of renal origin: Secondary | ICD-10-CM | POA: Diagnosis not present

## 2021-04-15 DIAGNOSIS — N2589 Other disorders resulting from impaired renal tubular function: Secondary | ICD-10-CM | POA: Diagnosis not present

## 2021-04-15 DIAGNOSIS — D631 Anemia in chronic kidney disease: Secondary | ICD-10-CM | POA: Diagnosis not present

## 2021-04-15 DIAGNOSIS — E1122 Type 2 diabetes mellitus with diabetic chronic kidney disease: Secondary | ICD-10-CM | POA: Diagnosis not present

## 2021-04-15 DIAGNOSIS — N186 End stage renal disease: Secondary | ICD-10-CM | POA: Diagnosis not present

## 2021-04-15 DIAGNOSIS — Z992 Dependence on renal dialysis: Secondary | ICD-10-CM | POA: Diagnosis not present

## 2021-04-16 DIAGNOSIS — K769 Liver disease, unspecified: Secondary | ICD-10-CM | POA: Diagnosis not present

## 2021-04-16 DIAGNOSIS — E1122 Type 2 diabetes mellitus with diabetic chronic kidney disease: Secondary | ICD-10-CM | POA: Diagnosis not present

## 2021-04-16 DIAGNOSIS — N2581 Secondary hyperparathyroidism of renal origin: Secondary | ICD-10-CM | POA: Diagnosis not present

## 2021-04-16 DIAGNOSIS — N2589 Other disorders resulting from impaired renal tubular function: Secondary | ICD-10-CM | POA: Diagnosis not present

## 2021-04-16 DIAGNOSIS — D631 Anemia in chronic kidney disease: Secondary | ICD-10-CM | POA: Diagnosis not present

## 2021-04-16 DIAGNOSIS — N186 End stage renal disease: Secondary | ICD-10-CM | POA: Diagnosis not present

## 2021-04-16 DIAGNOSIS — Z992 Dependence on renal dialysis: Secondary | ICD-10-CM | POA: Diagnosis not present

## 2021-04-16 DIAGNOSIS — D509 Iron deficiency anemia, unspecified: Secondary | ICD-10-CM | POA: Diagnosis not present

## 2021-04-17 DIAGNOSIS — D631 Anemia in chronic kidney disease: Secondary | ICD-10-CM | POA: Diagnosis not present

## 2021-04-17 DIAGNOSIS — N2589 Other disorders resulting from impaired renal tubular function: Secondary | ICD-10-CM | POA: Diagnosis not present

## 2021-04-17 DIAGNOSIS — N186 End stage renal disease: Secondary | ICD-10-CM | POA: Diagnosis not present

## 2021-04-17 DIAGNOSIS — K769 Liver disease, unspecified: Secondary | ICD-10-CM | POA: Diagnosis not present

## 2021-04-17 DIAGNOSIS — E1122 Type 2 diabetes mellitus with diabetic chronic kidney disease: Secondary | ICD-10-CM | POA: Diagnosis not present

## 2021-04-17 DIAGNOSIS — N2581 Secondary hyperparathyroidism of renal origin: Secondary | ICD-10-CM | POA: Diagnosis not present

## 2021-04-17 DIAGNOSIS — Z992 Dependence on renal dialysis: Secondary | ICD-10-CM | POA: Diagnosis not present

## 2021-04-17 DIAGNOSIS — D509 Iron deficiency anemia, unspecified: Secondary | ICD-10-CM | POA: Diagnosis not present

## 2021-04-18 DIAGNOSIS — Z992 Dependence on renal dialysis: Secondary | ICD-10-CM | POA: Diagnosis not present

## 2021-04-18 DIAGNOSIS — D631 Anemia in chronic kidney disease: Secondary | ICD-10-CM | POA: Diagnosis not present

## 2021-04-18 DIAGNOSIS — K769 Liver disease, unspecified: Secondary | ICD-10-CM | POA: Diagnosis not present

## 2021-04-18 DIAGNOSIS — N2581 Secondary hyperparathyroidism of renal origin: Secondary | ICD-10-CM | POA: Diagnosis not present

## 2021-04-18 DIAGNOSIS — D509 Iron deficiency anemia, unspecified: Secondary | ICD-10-CM | POA: Diagnosis not present

## 2021-04-18 DIAGNOSIS — E1122 Type 2 diabetes mellitus with diabetic chronic kidney disease: Secondary | ICD-10-CM | POA: Diagnosis not present

## 2021-04-18 DIAGNOSIS — N186 End stage renal disease: Secondary | ICD-10-CM | POA: Diagnosis not present

## 2021-04-18 DIAGNOSIS — N2589 Other disorders resulting from impaired renal tubular function: Secondary | ICD-10-CM | POA: Diagnosis not present

## 2021-04-19 DIAGNOSIS — N186 End stage renal disease: Secondary | ICD-10-CM | POA: Diagnosis not present

## 2021-04-19 DIAGNOSIS — K769 Liver disease, unspecified: Secondary | ICD-10-CM | POA: Diagnosis not present

## 2021-04-19 DIAGNOSIS — Z992 Dependence on renal dialysis: Secondary | ICD-10-CM | POA: Diagnosis not present

## 2021-04-19 DIAGNOSIS — D509 Iron deficiency anemia, unspecified: Secondary | ICD-10-CM | POA: Diagnosis not present

## 2021-04-19 DIAGNOSIS — N2581 Secondary hyperparathyroidism of renal origin: Secondary | ICD-10-CM | POA: Diagnosis not present

## 2021-04-19 DIAGNOSIS — N2589 Other disorders resulting from impaired renal tubular function: Secondary | ICD-10-CM | POA: Diagnosis not present

## 2021-04-19 DIAGNOSIS — E1122 Type 2 diabetes mellitus with diabetic chronic kidney disease: Secondary | ICD-10-CM | POA: Diagnosis not present

## 2021-04-19 DIAGNOSIS — D631 Anemia in chronic kidney disease: Secondary | ICD-10-CM | POA: Diagnosis not present

## 2021-04-20 DIAGNOSIS — Z992 Dependence on renal dialysis: Secondary | ICD-10-CM | POA: Diagnosis not present

## 2021-04-20 DIAGNOSIS — N2581 Secondary hyperparathyroidism of renal origin: Secondary | ICD-10-CM | POA: Diagnosis not present

## 2021-04-20 DIAGNOSIS — K769 Liver disease, unspecified: Secondary | ICD-10-CM | POA: Diagnosis not present

## 2021-04-20 DIAGNOSIS — D509 Iron deficiency anemia, unspecified: Secondary | ICD-10-CM | POA: Diagnosis not present

## 2021-04-20 DIAGNOSIS — E1122 Type 2 diabetes mellitus with diabetic chronic kidney disease: Secondary | ICD-10-CM | POA: Diagnosis not present

## 2021-04-20 DIAGNOSIS — N2589 Other disorders resulting from impaired renal tubular function: Secondary | ICD-10-CM | POA: Diagnosis not present

## 2021-04-20 DIAGNOSIS — N186 End stage renal disease: Secondary | ICD-10-CM | POA: Diagnosis not present

## 2021-04-20 DIAGNOSIS — D631 Anemia in chronic kidney disease: Secondary | ICD-10-CM | POA: Diagnosis not present

## 2021-04-21 DIAGNOSIS — D509 Iron deficiency anemia, unspecified: Secondary | ICD-10-CM | POA: Diagnosis not present

## 2021-04-21 DIAGNOSIS — K769 Liver disease, unspecified: Secondary | ICD-10-CM | POA: Diagnosis not present

## 2021-04-21 DIAGNOSIS — E1122 Type 2 diabetes mellitus with diabetic chronic kidney disease: Secondary | ICD-10-CM | POA: Diagnosis not present

## 2021-04-21 DIAGNOSIS — N2589 Other disorders resulting from impaired renal tubular function: Secondary | ICD-10-CM | POA: Diagnosis not present

## 2021-04-21 DIAGNOSIS — Z794 Long term (current) use of insulin: Secondary | ICD-10-CM | POA: Diagnosis not present

## 2021-04-21 DIAGNOSIS — D631 Anemia in chronic kidney disease: Secondary | ICD-10-CM | POA: Diagnosis not present

## 2021-04-21 DIAGNOSIS — E11621 Type 2 diabetes mellitus with foot ulcer: Secondary | ICD-10-CM | POA: Diagnosis not present

## 2021-04-21 DIAGNOSIS — N186 End stage renal disease: Secondary | ICD-10-CM | POA: Diagnosis not present

## 2021-04-21 DIAGNOSIS — N2581 Secondary hyperparathyroidism of renal origin: Secondary | ICD-10-CM | POA: Diagnosis not present

## 2021-04-21 DIAGNOSIS — Z992 Dependence on renal dialysis: Secondary | ICD-10-CM | POA: Diagnosis not present

## 2021-04-21 DIAGNOSIS — L97522 Non-pressure chronic ulcer of other part of left foot with fat layer exposed: Secondary | ICD-10-CM | POA: Diagnosis not present

## 2021-04-22 DIAGNOSIS — E1122 Type 2 diabetes mellitus with diabetic chronic kidney disease: Secondary | ICD-10-CM | POA: Diagnosis not present

## 2021-04-22 DIAGNOSIS — N2589 Other disorders resulting from impaired renal tubular function: Secondary | ICD-10-CM | POA: Diagnosis not present

## 2021-04-22 DIAGNOSIS — N2581 Secondary hyperparathyroidism of renal origin: Secondary | ICD-10-CM | POA: Diagnosis not present

## 2021-04-22 DIAGNOSIS — N186 End stage renal disease: Secondary | ICD-10-CM | POA: Diagnosis not present

## 2021-04-22 DIAGNOSIS — D509 Iron deficiency anemia, unspecified: Secondary | ICD-10-CM | POA: Diagnosis not present

## 2021-04-22 DIAGNOSIS — Z992 Dependence on renal dialysis: Secondary | ICD-10-CM | POA: Diagnosis not present

## 2021-04-22 DIAGNOSIS — K769 Liver disease, unspecified: Secondary | ICD-10-CM | POA: Diagnosis not present

## 2021-04-22 DIAGNOSIS — D631 Anemia in chronic kidney disease: Secondary | ICD-10-CM | POA: Diagnosis not present

## 2021-04-23 DIAGNOSIS — N186 End stage renal disease: Secondary | ICD-10-CM | POA: Diagnosis not present

## 2021-04-23 DIAGNOSIS — N2581 Secondary hyperparathyroidism of renal origin: Secondary | ICD-10-CM | POA: Diagnosis not present

## 2021-04-23 DIAGNOSIS — N2589 Other disorders resulting from impaired renal tubular function: Secondary | ICD-10-CM | POA: Diagnosis not present

## 2021-04-23 DIAGNOSIS — D631 Anemia in chronic kidney disease: Secondary | ICD-10-CM | POA: Diagnosis not present

## 2021-04-23 DIAGNOSIS — E1122 Type 2 diabetes mellitus with diabetic chronic kidney disease: Secondary | ICD-10-CM | POA: Diagnosis not present

## 2021-04-23 DIAGNOSIS — Z992 Dependence on renal dialysis: Secondary | ICD-10-CM | POA: Diagnosis not present

## 2021-04-23 DIAGNOSIS — K769 Liver disease, unspecified: Secondary | ICD-10-CM | POA: Diagnosis not present

## 2021-04-23 DIAGNOSIS — D509 Iron deficiency anemia, unspecified: Secondary | ICD-10-CM | POA: Diagnosis not present

## 2021-04-24 DIAGNOSIS — N186 End stage renal disease: Secondary | ICD-10-CM | POA: Diagnosis not present

## 2021-04-24 DIAGNOSIS — E1122 Type 2 diabetes mellitus with diabetic chronic kidney disease: Secondary | ICD-10-CM | POA: Diagnosis not present

## 2021-04-24 DIAGNOSIS — K769 Liver disease, unspecified: Secondary | ICD-10-CM | POA: Diagnosis not present

## 2021-04-24 DIAGNOSIS — D509 Iron deficiency anemia, unspecified: Secondary | ICD-10-CM | POA: Diagnosis not present

## 2021-04-24 DIAGNOSIS — D631 Anemia in chronic kidney disease: Secondary | ICD-10-CM | POA: Diagnosis not present

## 2021-04-24 DIAGNOSIS — N2581 Secondary hyperparathyroidism of renal origin: Secondary | ICD-10-CM | POA: Diagnosis not present

## 2021-04-24 DIAGNOSIS — Z992 Dependence on renal dialysis: Secondary | ICD-10-CM | POA: Diagnosis not present

## 2021-04-24 DIAGNOSIS — N2589 Other disorders resulting from impaired renal tubular function: Secondary | ICD-10-CM | POA: Diagnosis not present

## 2021-04-25 DIAGNOSIS — N2589 Other disorders resulting from impaired renal tubular function: Secondary | ICD-10-CM | POA: Diagnosis not present

## 2021-04-25 DIAGNOSIS — N2581 Secondary hyperparathyroidism of renal origin: Secondary | ICD-10-CM | POA: Diagnosis not present

## 2021-04-25 DIAGNOSIS — D631 Anemia in chronic kidney disease: Secondary | ICD-10-CM | POA: Diagnosis not present

## 2021-04-25 DIAGNOSIS — K769 Liver disease, unspecified: Secondary | ICD-10-CM | POA: Diagnosis not present

## 2021-04-25 DIAGNOSIS — Z992 Dependence on renal dialysis: Secondary | ICD-10-CM | POA: Diagnosis not present

## 2021-04-25 DIAGNOSIS — D509 Iron deficiency anemia, unspecified: Secondary | ICD-10-CM | POA: Diagnosis not present

## 2021-04-25 DIAGNOSIS — E1122 Type 2 diabetes mellitus with diabetic chronic kidney disease: Secondary | ICD-10-CM | POA: Diagnosis not present

## 2021-04-25 DIAGNOSIS — N186 End stage renal disease: Secondary | ICD-10-CM | POA: Diagnosis not present

## 2021-04-26 DIAGNOSIS — Z992 Dependence on renal dialysis: Secondary | ICD-10-CM | POA: Diagnosis not present

## 2021-04-26 DIAGNOSIS — D631 Anemia in chronic kidney disease: Secondary | ICD-10-CM | POA: Diagnosis not present

## 2021-04-26 DIAGNOSIS — E1122 Type 2 diabetes mellitus with diabetic chronic kidney disease: Secondary | ICD-10-CM | POA: Diagnosis not present

## 2021-04-26 DIAGNOSIS — N2589 Other disorders resulting from impaired renal tubular function: Secondary | ICD-10-CM | POA: Diagnosis not present

## 2021-04-26 DIAGNOSIS — N2581 Secondary hyperparathyroidism of renal origin: Secondary | ICD-10-CM | POA: Diagnosis not present

## 2021-04-26 DIAGNOSIS — D509 Iron deficiency anemia, unspecified: Secondary | ICD-10-CM | POA: Diagnosis not present

## 2021-04-26 DIAGNOSIS — K769 Liver disease, unspecified: Secondary | ICD-10-CM | POA: Diagnosis not present

## 2021-04-26 DIAGNOSIS — N186 End stage renal disease: Secondary | ICD-10-CM | POA: Diagnosis not present

## 2021-04-27 DIAGNOSIS — N2581 Secondary hyperparathyroidism of renal origin: Secondary | ICD-10-CM | POA: Diagnosis not present

## 2021-04-27 DIAGNOSIS — N2589 Other disorders resulting from impaired renal tubular function: Secondary | ICD-10-CM | POA: Diagnosis not present

## 2021-04-27 DIAGNOSIS — K769 Liver disease, unspecified: Secondary | ICD-10-CM | POA: Diagnosis not present

## 2021-04-27 DIAGNOSIS — D509 Iron deficiency anemia, unspecified: Secondary | ICD-10-CM | POA: Diagnosis not present

## 2021-04-27 DIAGNOSIS — N186 End stage renal disease: Secondary | ICD-10-CM | POA: Diagnosis not present

## 2021-04-27 DIAGNOSIS — Z992 Dependence on renal dialysis: Secondary | ICD-10-CM | POA: Diagnosis not present

## 2021-04-27 DIAGNOSIS — E1122 Type 2 diabetes mellitus with diabetic chronic kidney disease: Secondary | ICD-10-CM | POA: Diagnosis not present

## 2021-04-27 DIAGNOSIS — D631 Anemia in chronic kidney disease: Secondary | ICD-10-CM | POA: Diagnosis not present

## 2021-04-28 DIAGNOSIS — N2581 Secondary hyperparathyroidism of renal origin: Secondary | ICD-10-CM | POA: Diagnosis not present

## 2021-04-28 DIAGNOSIS — L97522 Non-pressure chronic ulcer of other part of left foot with fat layer exposed: Secondary | ICD-10-CM | POA: Diagnosis not present

## 2021-04-28 DIAGNOSIS — L97509 Non-pressure chronic ulcer of other part of unspecified foot with unspecified severity: Secondary | ICD-10-CM | POA: Diagnosis not present

## 2021-04-28 DIAGNOSIS — Z992 Dependence on renal dialysis: Secondary | ICD-10-CM | POA: Diagnosis not present

## 2021-04-28 DIAGNOSIS — N186 End stage renal disease: Secondary | ICD-10-CM | POA: Diagnosis not present

## 2021-04-28 DIAGNOSIS — N2589 Other disorders resulting from impaired renal tubular function: Secondary | ICD-10-CM | POA: Diagnosis not present

## 2021-04-28 DIAGNOSIS — K769 Liver disease, unspecified: Secondary | ICD-10-CM | POA: Diagnosis not present

## 2021-04-28 DIAGNOSIS — Z794 Long term (current) use of insulin: Secondary | ICD-10-CM | POA: Diagnosis not present

## 2021-04-28 DIAGNOSIS — D631 Anemia in chronic kidney disease: Secondary | ICD-10-CM | POA: Diagnosis not present

## 2021-04-28 DIAGNOSIS — E1122 Type 2 diabetes mellitus with diabetic chronic kidney disease: Secondary | ICD-10-CM | POA: Diagnosis not present

## 2021-04-28 DIAGNOSIS — E11621 Type 2 diabetes mellitus with foot ulcer: Secondary | ICD-10-CM | POA: Diagnosis not present

## 2021-04-28 DIAGNOSIS — D509 Iron deficiency anemia, unspecified: Secondary | ICD-10-CM | POA: Diagnosis not present

## 2021-04-29 DIAGNOSIS — N2589 Other disorders resulting from impaired renal tubular function: Secondary | ICD-10-CM | POA: Diagnosis not present

## 2021-04-29 DIAGNOSIS — D631 Anemia in chronic kidney disease: Secondary | ICD-10-CM | POA: Diagnosis not present

## 2021-04-29 DIAGNOSIS — D509 Iron deficiency anemia, unspecified: Secondary | ICD-10-CM | POA: Diagnosis not present

## 2021-04-29 DIAGNOSIS — E1122 Type 2 diabetes mellitus with diabetic chronic kidney disease: Secondary | ICD-10-CM | POA: Diagnosis not present

## 2021-04-29 DIAGNOSIS — Z992 Dependence on renal dialysis: Secondary | ICD-10-CM | POA: Diagnosis not present

## 2021-04-29 DIAGNOSIS — N2581 Secondary hyperparathyroidism of renal origin: Secondary | ICD-10-CM | POA: Diagnosis not present

## 2021-04-29 DIAGNOSIS — K769 Liver disease, unspecified: Secondary | ICD-10-CM | POA: Diagnosis not present

## 2021-04-29 DIAGNOSIS — N186 End stage renal disease: Secondary | ICD-10-CM | POA: Diagnosis not present

## 2021-04-30 DIAGNOSIS — D509 Iron deficiency anemia, unspecified: Secondary | ICD-10-CM | POA: Diagnosis not present

## 2021-04-30 DIAGNOSIS — D631 Anemia in chronic kidney disease: Secondary | ICD-10-CM | POA: Diagnosis not present

## 2021-04-30 DIAGNOSIS — N186 End stage renal disease: Secondary | ICD-10-CM | POA: Diagnosis not present

## 2021-04-30 DIAGNOSIS — K769 Liver disease, unspecified: Secondary | ICD-10-CM | POA: Diagnosis not present

## 2021-04-30 DIAGNOSIS — N2589 Other disorders resulting from impaired renal tubular function: Secondary | ICD-10-CM | POA: Diagnosis not present

## 2021-04-30 DIAGNOSIS — Z992 Dependence on renal dialysis: Secondary | ICD-10-CM | POA: Diagnosis not present

## 2021-04-30 DIAGNOSIS — E1122 Type 2 diabetes mellitus with diabetic chronic kidney disease: Secondary | ICD-10-CM | POA: Diagnosis not present

## 2021-04-30 DIAGNOSIS — N2581 Secondary hyperparathyroidism of renal origin: Secondary | ICD-10-CM | POA: Diagnosis not present

## 2021-05-01 DIAGNOSIS — Z992 Dependence on renal dialysis: Secondary | ICD-10-CM | POA: Diagnosis not present

## 2021-05-01 DIAGNOSIS — D509 Iron deficiency anemia, unspecified: Secondary | ICD-10-CM | POA: Diagnosis not present

## 2021-05-01 DIAGNOSIS — K769 Liver disease, unspecified: Secondary | ICD-10-CM | POA: Diagnosis not present

## 2021-05-01 DIAGNOSIS — N2589 Other disorders resulting from impaired renal tubular function: Secondary | ICD-10-CM | POA: Diagnosis not present

## 2021-05-01 DIAGNOSIS — N2581 Secondary hyperparathyroidism of renal origin: Secondary | ICD-10-CM | POA: Diagnosis not present

## 2021-05-01 DIAGNOSIS — N186 End stage renal disease: Secondary | ICD-10-CM | POA: Diagnosis not present

## 2021-05-01 DIAGNOSIS — D631 Anemia in chronic kidney disease: Secondary | ICD-10-CM | POA: Diagnosis not present

## 2021-05-01 DIAGNOSIS — E1122 Type 2 diabetes mellitus with diabetic chronic kidney disease: Secondary | ICD-10-CM | POA: Diagnosis not present

## 2021-05-02 DIAGNOSIS — N186 End stage renal disease: Secondary | ICD-10-CM | POA: Diagnosis not present

## 2021-05-02 DIAGNOSIS — N2589 Other disorders resulting from impaired renal tubular function: Secondary | ICD-10-CM | POA: Diagnosis not present

## 2021-05-02 DIAGNOSIS — E1122 Type 2 diabetes mellitus with diabetic chronic kidney disease: Secondary | ICD-10-CM | POA: Diagnosis not present

## 2021-05-02 DIAGNOSIS — D509 Iron deficiency anemia, unspecified: Secondary | ICD-10-CM | POA: Diagnosis not present

## 2021-05-02 DIAGNOSIS — D631 Anemia in chronic kidney disease: Secondary | ICD-10-CM | POA: Diagnosis not present

## 2021-05-02 DIAGNOSIS — N2581 Secondary hyperparathyroidism of renal origin: Secondary | ICD-10-CM | POA: Diagnosis not present

## 2021-05-02 DIAGNOSIS — K769 Liver disease, unspecified: Secondary | ICD-10-CM | POA: Diagnosis not present

## 2021-05-02 DIAGNOSIS — Z992 Dependence on renal dialysis: Secondary | ICD-10-CM | POA: Diagnosis not present

## 2021-05-03 DIAGNOSIS — E1122 Type 2 diabetes mellitus with diabetic chronic kidney disease: Secondary | ICD-10-CM | POA: Diagnosis not present

## 2021-05-03 DIAGNOSIS — N2589 Other disorders resulting from impaired renal tubular function: Secondary | ICD-10-CM | POA: Diagnosis not present

## 2021-05-03 DIAGNOSIS — D509 Iron deficiency anemia, unspecified: Secondary | ICD-10-CM | POA: Diagnosis not present

## 2021-05-03 DIAGNOSIS — N186 End stage renal disease: Secondary | ICD-10-CM | POA: Diagnosis not present

## 2021-05-03 DIAGNOSIS — N2581 Secondary hyperparathyroidism of renal origin: Secondary | ICD-10-CM | POA: Diagnosis not present

## 2021-05-03 DIAGNOSIS — Z992 Dependence on renal dialysis: Secondary | ICD-10-CM | POA: Diagnosis not present

## 2021-05-03 DIAGNOSIS — D631 Anemia in chronic kidney disease: Secondary | ICD-10-CM | POA: Diagnosis not present

## 2021-05-03 DIAGNOSIS — K769 Liver disease, unspecified: Secondary | ICD-10-CM | POA: Diagnosis not present

## 2021-05-04 DIAGNOSIS — Z992 Dependence on renal dialysis: Secondary | ICD-10-CM | POA: Diagnosis not present

## 2021-05-04 DIAGNOSIS — K769 Liver disease, unspecified: Secondary | ICD-10-CM | POA: Diagnosis not present

## 2021-05-04 DIAGNOSIS — E1122 Type 2 diabetes mellitus with diabetic chronic kidney disease: Secondary | ICD-10-CM | POA: Diagnosis not present

## 2021-05-04 DIAGNOSIS — D509 Iron deficiency anemia, unspecified: Secondary | ICD-10-CM | POA: Diagnosis not present

## 2021-05-04 DIAGNOSIS — N2589 Other disorders resulting from impaired renal tubular function: Secondary | ICD-10-CM | POA: Diagnosis not present

## 2021-05-04 DIAGNOSIS — N186 End stage renal disease: Secondary | ICD-10-CM | POA: Diagnosis not present

## 2021-05-04 DIAGNOSIS — N2581 Secondary hyperparathyroidism of renal origin: Secondary | ICD-10-CM | POA: Diagnosis not present

## 2021-05-04 DIAGNOSIS — D631 Anemia in chronic kidney disease: Secondary | ICD-10-CM | POA: Diagnosis not present

## 2021-05-05 DIAGNOSIS — D631 Anemia in chronic kidney disease: Secondary | ICD-10-CM | POA: Diagnosis not present

## 2021-05-05 DIAGNOSIS — E11621 Type 2 diabetes mellitus with foot ulcer: Secondary | ICD-10-CM | POA: Diagnosis not present

## 2021-05-05 DIAGNOSIS — N186 End stage renal disease: Secondary | ICD-10-CM | POA: Diagnosis not present

## 2021-05-05 DIAGNOSIS — Z794 Long term (current) use of insulin: Secondary | ICD-10-CM | POA: Diagnosis not present

## 2021-05-05 DIAGNOSIS — D509 Iron deficiency anemia, unspecified: Secondary | ICD-10-CM | POA: Diagnosis not present

## 2021-05-05 DIAGNOSIS — L97522 Non-pressure chronic ulcer of other part of left foot with fat layer exposed: Secondary | ICD-10-CM | POA: Diagnosis not present

## 2021-05-05 DIAGNOSIS — N2589 Other disorders resulting from impaired renal tubular function: Secondary | ICD-10-CM | POA: Diagnosis not present

## 2021-05-05 DIAGNOSIS — K769 Liver disease, unspecified: Secondary | ICD-10-CM | POA: Diagnosis not present

## 2021-05-05 DIAGNOSIS — E1122 Type 2 diabetes mellitus with diabetic chronic kidney disease: Secondary | ICD-10-CM | POA: Diagnosis not present

## 2021-05-05 DIAGNOSIS — Z992 Dependence on renal dialysis: Secondary | ICD-10-CM | POA: Diagnosis not present

## 2021-05-05 DIAGNOSIS — N2581 Secondary hyperparathyroidism of renal origin: Secondary | ICD-10-CM | POA: Diagnosis not present

## 2021-05-06 DIAGNOSIS — E1122 Type 2 diabetes mellitus with diabetic chronic kidney disease: Secondary | ICD-10-CM | POA: Diagnosis not present

## 2021-05-06 DIAGNOSIS — N186 End stage renal disease: Secondary | ICD-10-CM | POA: Diagnosis not present

## 2021-05-06 DIAGNOSIS — Z992 Dependence on renal dialysis: Secondary | ICD-10-CM | POA: Diagnosis not present

## 2021-05-06 DIAGNOSIS — N2581 Secondary hyperparathyroidism of renal origin: Secondary | ICD-10-CM | POA: Diagnosis not present

## 2021-05-06 DIAGNOSIS — K769 Liver disease, unspecified: Secondary | ICD-10-CM | POA: Diagnosis not present

## 2021-05-06 DIAGNOSIS — N2589 Other disorders resulting from impaired renal tubular function: Secondary | ICD-10-CM | POA: Diagnosis not present

## 2021-05-06 DIAGNOSIS — D631 Anemia in chronic kidney disease: Secondary | ICD-10-CM | POA: Diagnosis not present

## 2021-05-06 DIAGNOSIS — D509 Iron deficiency anemia, unspecified: Secondary | ICD-10-CM | POA: Diagnosis not present

## 2021-05-07 DIAGNOSIS — Z992 Dependence on renal dialysis: Secondary | ICD-10-CM | POA: Diagnosis not present

## 2021-05-07 DIAGNOSIS — N186 End stage renal disease: Secondary | ICD-10-CM | POA: Diagnosis not present

## 2021-05-07 DIAGNOSIS — D631 Anemia in chronic kidney disease: Secondary | ICD-10-CM | POA: Diagnosis not present

## 2021-05-07 DIAGNOSIS — E1122 Type 2 diabetes mellitus with diabetic chronic kidney disease: Secondary | ICD-10-CM | POA: Diagnosis not present

## 2021-05-07 DIAGNOSIS — N2589 Other disorders resulting from impaired renal tubular function: Secondary | ICD-10-CM | POA: Diagnosis not present

## 2021-05-07 DIAGNOSIS — N2581 Secondary hyperparathyroidism of renal origin: Secondary | ICD-10-CM | POA: Diagnosis not present

## 2021-05-07 DIAGNOSIS — D509 Iron deficiency anemia, unspecified: Secondary | ICD-10-CM | POA: Diagnosis not present

## 2021-05-07 DIAGNOSIS — K769 Liver disease, unspecified: Secondary | ICD-10-CM | POA: Diagnosis not present

## 2021-05-08 DIAGNOSIS — D631 Anemia in chronic kidney disease: Secondary | ICD-10-CM | POA: Diagnosis not present

## 2021-05-08 DIAGNOSIS — N186 End stage renal disease: Secondary | ICD-10-CM | POA: Diagnosis not present

## 2021-05-08 DIAGNOSIS — E440 Moderate protein-calorie malnutrition: Secondary | ICD-10-CM | POA: Diagnosis not present

## 2021-05-08 DIAGNOSIS — N2589 Other disorders resulting from impaired renal tubular function: Secondary | ICD-10-CM | POA: Diagnosis not present

## 2021-05-08 DIAGNOSIS — Z79899 Other long term (current) drug therapy: Secondary | ICD-10-CM | POA: Diagnosis not present

## 2021-05-08 DIAGNOSIS — N2581 Secondary hyperparathyroidism of renal origin: Secondary | ICD-10-CM | POA: Diagnosis not present

## 2021-05-08 DIAGNOSIS — R17 Unspecified jaundice: Secondary | ICD-10-CM | POA: Diagnosis not present

## 2021-05-08 DIAGNOSIS — D509 Iron deficiency anemia, unspecified: Secondary | ICD-10-CM | POA: Diagnosis not present

## 2021-05-08 DIAGNOSIS — Z992 Dependence on renal dialysis: Secondary | ICD-10-CM | POA: Diagnosis not present

## 2021-05-09 DIAGNOSIS — N2589 Other disorders resulting from impaired renal tubular function: Secondary | ICD-10-CM | POA: Diagnosis not present

## 2021-05-09 DIAGNOSIS — N186 End stage renal disease: Secondary | ICD-10-CM | POA: Diagnosis not present

## 2021-05-09 DIAGNOSIS — D509 Iron deficiency anemia, unspecified: Secondary | ICD-10-CM | POA: Diagnosis not present

## 2021-05-09 DIAGNOSIS — E440 Moderate protein-calorie malnutrition: Secondary | ICD-10-CM | POA: Diagnosis not present

## 2021-05-09 DIAGNOSIS — Z79899 Other long term (current) drug therapy: Secondary | ICD-10-CM | POA: Diagnosis not present

## 2021-05-09 DIAGNOSIS — D631 Anemia in chronic kidney disease: Secondary | ICD-10-CM | POA: Diagnosis not present

## 2021-05-09 DIAGNOSIS — R17 Unspecified jaundice: Secondary | ICD-10-CM | POA: Diagnosis not present

## 2021-05-09 DIAGNOSIS — N2581 Secondary hyperparathyroidism of renal origin: Secondary | ICD-10-CM | POA: Diagnosis not present

## 2021-05-09 DIAGNOSIS — Z992 Dependence on renal dialysis: Secondary | ICD-10-CM | POA: Diagnosis not present

## 2021-05-10 DIAGNOSIS — E440 Moderate protein-calorie malnutrition: Secondary | ICD-10-CM | POA: Diagnosis not present

## 2021-05-10 DIAGNOSIS — N2581 Secondary hyperparathyroidism of renal origin: Secondary | ICD-10-CM | POA: Diagnosis not present

## 2021-05-10 DIAGNOSIS — N2589 Other disorders resulting from impaired renal tubular function: Secondary | ICD-10-CM | POA: Diagnosis not present

## 2021-05-10 DIAGNOSIS — N186 End stage renal disease: Secondary | ICD-10-CM | POA: Diagnosis not present

## 2021-05-10 DIAGNOSIS — R17 Unspecified jaundice: Secondary | ICD-10-CM | POA: Diagnosis not present

## 2021-05-10 DIAGNOSIS — Z79899 Other long term (current) drug therapy: Secondary | ICD-10-CM | POA: Diagnosis not present

## 2021-05-10 DIAGNOSIS — Z992 Dependence on renal dialysis: Secondary | ICD-10-CM | POA: Diagnosis not present

## 2021-05-10 DIAGNOSIS — D509 Iron deficiency anemia, unspecified: Secondary | ICD-10-CM | POA: Diagnosis not present

## 2021-05-10 DIAGNOSIS — D631 Anemia in chronic kidney disease: Secondary | ICD-10-CM | POA: Diagnosis not present

## 2021-05-11 DIAGNOSIS — N186 End stage renal disease: Secondary | ICD-10-CM | POA: Diagnosis not present

## 2021-05-11 DIAGNOSIS — D631 Anemia in chronic kidney disease: Secondary | ICD-10-CM | POA: Diagnosis not present

## 2021-05-11 DIAGNOSIS — R17 Unspecified jaundice: Secondary | ICD-10-CM | POA: Diagnosis not present

## 2021-05-11 DIAGNOSIS — N2581 Secondary hyperparathyroidism of renal origin: Secondary | ICD-10-CM | POA: Diagnosis not present

## 2021-05-11 DIAGNOSIS — Z79899 Other long term (current) drug therapy: Secondary | ICD-10-CM | POA: Diagnosis not present

## 2021-05-11 DIAGNOSIS — Z992 Dependence on renal dialysis: Secondary | ICD-10-CM | POA: Diagnosis not present

## 2021-05-11 DIAGNOSIS — E440 Moderate protein-calorie malnutrition: Secondary | ICD-10-CM | POA: Diagnosis not present

## 2021-05-11 DIAGNOSIS — D509 Iron deficiency anemia, unspecified: Secondary | ICD-10-CM | POA: Diagnosis not present

## 2021-05-11 DIAGNOSIS — N2589 Other disorders resulting from impaired renal tubular function: Secondary | ICD-10-CM | POA: Diagnosis not present

## 2021-05-12 DIAGNOSIS — R17 Unspecified jaundice: Secondary | ICD-10-CM | POA: Diagnosis not present

## 2021-05-12 DIAGNOSIS — Z992 Dependence on renal dialysis: Secondary | ICD-10-CM | POA: Diagnosis not present

## 2021-05-12 DIAGNOSIS — D509 Iron deficiency anemia, unspecified: Secondary | ICD-10-CM | POA: Diagnosis not present

## 2021-05-12 DIAGNOSIS — N186 End stage renal disease: Secondary | ICD-10-CM | POA: Diagnosis not present

## 2021-05-12 DIAGNOSIS — Z79899 Other long term (current) drug therapy: Secondary | ICD-10-CM | POA: Diagnosis not present

## 2021-05-12 DIAGNOSIS — D631 Anemia in chronic kidney disease: Secondary | ICD-10-CM | POA: Diagnosis not present

## 2021-05-12 DIAGNOSIS — N2589 Other disorders resulting from impaired renal tubular function: Secondary | ICD-10-CM | POA: Diagnosis not present

## 2021-05-12 DIAGNOSIS — E440 Moderate protein-calorie malnutrition: Secondary | ICD-10-CM | POA: Diagnosis not present

## 2021-05-12 DIAGNOSIS — N2581 Secondary hyperparathyroidism of renal origin: Secondary | ICD-10-CM | POA: Diagnosis not present

## 2021-05-13 DIAGNOSIS — Z794 Long term (current) use of insulin: Secondary | ICD-10-CM | POA: Diagnosis not present

## 2021-05-13 DIAGNOSIS — Z79899 Other long term (current) drug therapy: Secondary | ICD-10-CM | POA: Diagnosis not present

## 2021-05-13 DIAGNOSIS — D509 Iron deficiency anemia, unspecified: Secondary | ICD-10-CM | POA: Diagnosis not present

## 2021-05-13 DIAGNOSIS — R17 Unspecified jaundice: Secondary | ICD-10-CM | POA: Diagnosis not present

## 2021-05-13 DIAGNOSIS — N2589 Other disorders resulting from impaired renal tubular function: Secondary | ICD-10-CM | POA: Diagnosis not present

## 2021-05-13 DIAGNOSIS — E440 Moderate protein-calorie malnutrition: Secondary | ICD-10-CM | POA: Diagnosis not present

## 2021-05-13 DIAGNOSIS — L97509 Non-pressure chronic ulcer of other part of unspecified foot with unspecified severity: Secondary | ICD-10-CM | POA: Diagnosis not present

## 2021-05-13 DIAGNOSIS — L97522 Non-pressure chronic ulcer of other part of left foot with fat layer exposed: Secondary | ICD-10-CM | POA: Diagnosis not present

## 2021-05-13 DIAGNOSIS — Z992 Dependence on renal dialysis: Secondary | ICD-10-CM | POA: Diagnosis not present

## 2021-05-13 DIAGNOSIS — D631 Anemia in chronic kidney disease: Secondary | ICD-10-CM | POA: Diagnosis not present

## 2021-05-13 DIAGNOSIS — N186 End stage renal disease: Secondary | ICD-10-CM | POA: Diagnosis not present

## 2021-05-13 DIAGNOSIS — N2581 Secondary hyperparathyroidism of renal origin: Secondary | ICD-10-CM | POA: Diagnosis not present

## 2021-05-13 DIAGNOSIS — E11621 Type 2 diabetes mellitus with foot ulcer: Secondary | ICD-10-CM | POA: Diagnosis not present

## 2021-05-14 DIAGNOSIS — E440 Moderate protein-calorie malnutrition: Secondary | ICD-10-CM | POA: Diagnosis not present

## 2021-05-14 DIAGNOSIS — N2589 Other disorders resulting from impaired renal tubular function: Secondary | ICD-10-CM | POA: Diagnosis not present

## 2021-05-14 DIAGNOSIS — N2581 Secondary hyperparathyroidism of renal origin: Secondary | ICD-10-CM | POA: Diagnosis not present

## 2021-05-14 DIAGNOSIS — D631 Anemia in chronic kidney disease: Secondary | ICD-10-CM | POA: Diagnosis not present

## 2021-05-14 DIAGNOSIS — Z992 Dependence on renal dialysis: Secondary | ICD-10-CM | POA: Diagnosis not present

## 2021-05-14 DIAGNOSIS — Z79899 Other long term (current) drug therapy: Secondary | ICD-10-CM | POA: Diagnosis not present

## 2021-05-14 DIAGNOSIS — D509 Iron deficiency anemia, unspecified: Secondary | ICD-10-CM | POA: Diagnosis not present

## 2021-05-14 DIAGNOSIS — N186 End stage renal disease: Secondary | ICD-10-CM | POA: Diagnosis not present

## 2021-05-14 DIAGNOSIS — R17 Unspecified jaundice: Secondary | ICD-10-CM | POA: Diagnosis not present

## 2021-05-15 DIAGNOSIS — E440 Moderate protein-calorie malnutrition: Secondary | ICD-10-CM | POA: Diagnosis not present

## 2021-05-15 DIAGNOSIS — Z992 Dependence on renal dialysis: Secondary | ICD-10-CM | POA: Diagnosis not present

## 2021-05-15 DIAGNOSIS — N2581 Secondary hyperparathyroidism of renal origin: Secondary | ICD-10-CM | POA: Diagnosis not present

## 2021-05-15 DIAGNOSIS — D509 Iron deficiency anemia, unspecified: Secondary | ICD-10-CM | POA: Diagnosis not present

## 2021-05-15 DIAGNOSIS — N186 End stage renal disease: Secondary | ICD-10-CM | POA: Diagnosis not present

## 2021-05-15 DIAGNOSIS — Z79899 Other long term (current) drug therapy: Secondary | ICD-10-CM | POA: Diagnosis not present

## 2021-05-15 DIAGNOSIS — N2589 Other disorders resulting from impaired renal tubular function: Secondary | ICD-10-CM | POA: Diagnosis not present

## 2021-05-15 DIAGNOSIS — D631 Anemia in chronic kidney disease: Secondary | ICD-10-CM | POA: Diagnosis not present

## 2021-05-15 DIAGNOSIS — R17 Unspecified jaundice: Secondary | ICD-10-CM | POA: Diagnosis not present

## 2021-05-16 DIAGNOSIS — D509 Iron deficiency anemia, unspecified: Secondary | ICD-10-CM | POA: Diagnosis not present

## 2021-05-16 DIAGNOSIS — N2581 Secondary hyperparathyroidism of renal origin: Secondary | ICD-10-CM | POA: Diagnosis not present

## 2021-05-16 DIAGNOSIS — R17 Unspecified jaundice: Secondary | ICD-10-CM | POA: Diagnosis not present

## 2021-05-16 DIAGNOSIS — N186 End stage renal disease: Secondary | ICD-10-CM | POA: Diagnosis not present

## 2021-05-16 DIAGNOSIS — N2589 Other disorders resulting from impaired renal tubular function: Secondary | ICD-10-CM | POA: Diagnosis not present

## 2021-05-16 DIAGNOSIS — D631 Anemia in chronic kidney disease: Secondary | ICD-10-CM | POA: Diagnosis not present

## 2021-05-16 DIAGNOSIS — E440 Moderate protein-calorie malnutrition: Secondary | ICD-10-CM | POA: Diagnosis not present

## 2021-05-16 DIAGNOSIS — Z79899 Other long term (current) drug therapy: Secondary | ICD-10-CM | POA: Diagnosis not present

## 2021-05-16 DIAGNOSIS — Z992 Dependence on renal dialysis: Secondary | ICD-10-CM | POA: Diagnosis not present

## 2021-05-17 DIAGNOSIS — D631 Anemia in chronic kidney disease: Secondary | ICD-10-CM | POA: Diagnosis not present

## 2021-05-17 DIAGNOSIS — Z992 Dependence on renal dialysis: Secondary | ICD-10-CM | POA: Diagnosis not present

## 2021-05-17 DIAGNOSIS — N186 End stage renal disease: Secondary | ICD-10-CM | POA: Diagnosis not present

## 2021-05-17 DIAGNOSIS — N2581 Secondary hyperparathyroidism of renal origin: Secondary | ICD-10-CM | POA: Diagnosis not present

## 2021-05-17 DIAGNOSIS — R17 Unspecified jaundice: Secondary | ICD-10-CM | POA: Diagnosis not present

## 2021-05-17 DIAGNOSIS — Z79899 Other long term (current) drug therapy: Secondary | ICD-10-CM | POA: Diagnosis not present

## 2021-05-17 DIAGNOSIS — E440 Moderate protein-calorie malnutrition: Secondary | ICD-10-CM | POA: Diagnosis not present

## 2021-05-17 DIAGNOSIS — D509 Iron deficiency anemia, unspecified: Secondary | ICD-10-CM | POA: Diagnosis not present

## 2021-05-17 DIAGNOSIS — N2589 Other disorders resulting from impaired renal tubular function: Secondary | ICD-10-CM | POA: Diagnosis not present

## 2021-05-18 DIAGNOSIS — Z992 Dependence on renal dialysis: Secondary | ICD-10-CM | POA: Diagnosis not present

## 2021-05-18 DIAGNOSIS — N2589 Other disorders resulting from impaired renal tubular function: Secondary | ICD-10-CM | POA: Diagnosis not present

## 2021-05-18 DIAGNOSIS — N2581 Secondary hyperparathyroidism of renal origin: Secondary | ICD-10-CM | POA: Diagnosis not present

## 2021-05-18 DIAGNOSIS — Z79899 Other long term (current) drug therapy: Secondary | ICD-10-CM | POA: Diagnosis not present

## 2021-05-18 DIAGNOSIS — E440 Moderate protein-calorie malnutrition: Secondary | ICD-10-CM | POA: Diagnosis not present

## 2021-05-18 DIAGNOSIS — R17 Unspecified jaundice: Secondary | ICD-10-CM | POA: Diagnosis not present

## 2021-05-18 DIAGNOSIS — D631 Anemia in chronic kidney disease: Secondary | ICD-10-CM | POA: Diagnosis not present

## 2021-05-18 DIAGNOSIS — N186 End stage renal disease: Secondary | ICD-10-CM | POA: Diagnosis not present

## 2021-05-18 DIAGNOSIS — D509 Iron deficiency anemia, unspecified: Secondary | ICD-10-CM | POA: Diagnosis not present

## 2021-05-19 DIAGNOSIS — Z992 Dependence on renal dialysis: Secondary | ICD-10-CM | POA: Diagnosis not present

## 2021-05-19 DIAGNOSIS — N2581 Secondary hyperparathyroidism of renal origin: Secondary | ICD-10-CM | POA: Diagnosis not present

## 2021-05-19 DIAGNOSIS — N186 End stage renal disease: Secondary | ICD-10-CM | POA: Diagnosis not present

## 2021-05-19 DIAGNOSIS — D631 Anemia in chronic kidney disease: Secondary | ICD-10-CM | POA: Diagnosis not present

## 2021-05-19 DIAGNOSIS — D509 Iron deficiency anemia, unspecified: Secondary | ICD-10-CM | POA: Diagnosis not present

## 2021-05-19 DIAGNOSIS — E440 Moderate protein-calorie malnutrition: Secondary | ICD-10-CM | POA: Diagnosis not present

## 2021-05-19 DIAGNOSIS — R17 Unspecified jaundice: Secondary | ICD-10-CM | POA: Diagnosis not present

## 2021-05-19 DIAGNOSIS — Z79899 Other long term (current) drug therapy: Secondary | ICD-10-CM | POA: Diagnosis not present

## 2021-05-19 DIAGNOSIS — N2589 Other disorders resulting from impaired renal tubular function: Secondary | ICD-10-CM | POA: Diagnosis not present

## 2021-05-19 DIAGNOSIS — G4733 Obstructive sleep apnea (adult) (pediatric): Secondary | ICD-10-CM | POA: Diagnosis not present

## 2021-05-20 ENCOUNTER — Other Ambulatory Visit
Admission: RE | Admit: 2021-05-20 | Discharge: 2021-05-20 | Disposition: A | Discharge status: Home / Self Care | Payer: Medicare Other | Source: Ambulatory Visit | Attending: Nephrology | Admitting: Nephrology

## 2021-05-20 DIAGNOSIS — N186 End stage renal disease: Secondary | ICD-10-CM | POA: Diagnosis not present

## 2021-05-20 DIAGNOSIS — Z79899 Other long term (current) drug therapy: Secondary | ICD-10-CM | POA: Diagnosis not present

## 2021-05-20 DIAGNOSIS — D631 Anemia in chronic kidney disease: Secondary | ICD-10-CM | POA: Diagnosis not present

## 2021-05-20 DIAGNOSIS — N2581 Secondary hyperparathyroidism of renal origin: Secondary | ICD-10-CM | POA: Diagnosis not present

## 2021-05-20 DIAGNOSIS — N2589 Other disorders resulting from impaired renal tubular function: Secondary | ICD-10-CM | POA: Diagnosis not present

## 2021-05-20 DIAGNOSIS — Z992 Dependence on renal dialysis: Secondary | ICD-10-CM | POA: Diagnosis not present

## 2021-05-20 DIAGNOSIS — R17 Unspecified jaundice: Secondary | ICD-10-CM | POA: Diagnosis not present

## 2021-05-20 DIAGNOSIS — E440 Moderate protein-calorie malnutrition: Secondary | ICD-10-CM | POA: Diagnosis not present

## 2021-05-20 DIAGNOSIS — D509 Iron deficiency anemia, unspecified: Secondary | ICD-10-CM | POA: Diagnosis not present

## 2021-05-20 LAB — HEMOGLOBIN: Hemoglobin: 6.1 g/dL — ABNORMAL LOW (ref 13.0–17.0)

## 2021-05-21 ENCOUNTER — Ambulatory Visit: Admission: RE | Admit: 2021-05-21 | Payer: Medicare Other | Source: Ambulatory Visit

## 2021-05-21 DIAGNOSIS — Z79899 Other long term (current) drug therapy: Secondary | ICD-10-CM | POA: Diagnosis not present

## 2021-05-21 DIAGNOSIS — L97522 Non-pressure chronic ulcer of other part of left foot with fat layer exposed: Secondary | ICD-10-CM | POA: Diagnosis not present

## 2021-05-21 DIAGNOSIS — N186 End stage renal disease: Secondary | ICD-10-CM | POA: Diagnosis not present

## 2021-05-21 DIAGNOSIS — E440 Moderate protein-calorie malnutrition: Secondary | ICD-10-CM | POA: Diagnosis not present

## 2021-05-21 DIAGNOSIS — R17 Unspecified jaundice: Secondary | ICD-10-CM | POA: Diagnosis not present

## 2021-05-21 DIAGNOSIS — N2589 Other disorders resulting from impaired renal tubular function: Secondary | ICD-10-CM | POA: Diagnosis not present

## 2021-05-21 DIAGNOSIS — Z794 Long term (current) use of insulin: Secondary | ICD-10-CM | POA: Diagnosis not present

## 2021-05-21 DIAGNOSIS — Z992 Dependence on renal dialysis: Secondary | ICD-10-CM | POA: Diagnosis not present

## 2021-05-21 DIAGNOSIS — D509 Iron deficiency anemia, unspecified: Secondary | ICD-10-CM | POA: Diagnosis not present

## 2021-05-21 DIAGNOSIS — E11621 Type 2 diabetes mellitus with foot ulcer: Secondary | ICD-10-CM | POA: Diagnosis not present

## 2021-05-21 DIAGNOSIS — N2581 Secondary hyperparathyroidism of renal origin: Secondary | ICD-10-CM | POA: Diagnosis not present

## 2021-05-21 DIAGNOSIS — D631 Anemia in chronic kidney disease: Secondary | ICD-10-CM | POA: Diagnosis not present

## 2021-05-22 ENCOUNTER — Ambulatory Visit: Payer: Medicare Other

## 2021-05-22 DIAGNOSIS — D509 Iron deficiency anemia, unspecified: Secondary | ICD-10-CM | POA: Diagnosis not present

## 2021-05-22 DIAGNOSIS — N2581 Secondary hyperparathyroidism of renal origin: Secondary | ICD-10-CM | POA: Diagnosis not present

## 2021-05-22 DIAGNOSIS — Z992 Dependence on renal dialysis: Secondary | ICD-10-CM | POA: Diagnosis not present

## 2021-05-22 DIAGNOSIS — N2589 Other disorders resulting from impaired renal tubular function: Secondary | ICD-10-CM | POA: Diagnosis not present

## 2021-05-22 DIAGNOSIS — E440 Moderate protein-calorie malnutrition: Secondary | ICD-10-CM | POA: Diagnosis not present

## 2021-05-22 DIAGNOSIS — D631 Anemia in chronic kidney disease: Secondary | ICD-10-CM | POA: Diagnosis not present

## 2021-05-22 DIAGNOSIS — Z79899 Other long term (current) drug therapy: Secondary | ICD-10-CM | POA: Diagnosis not present

## 2021-05-22 DIAGNOSIS — R17 Unspecified jaundice: Secondary | ICD-10-CM | POA: Diagnosis not present

## 2021-05-22 DIAGNOSIS — N186 End stage renal disease: Secondary | ICD-10-CM | POA: Diagnosis not present

## 2021-05-23 DIAGNOSIS — N186 End stage renal disease: Secondary | ICD-10-CM | POA: Diagnosis not present

## 2021-05-23 DIAGNOSIS — N2581 Secondary hyperparathyroidism of renal origin: Secondary | ICD-10-CM | POA: Diagnosis not present

## 2021-05-23 DIAGNOSIS — R17 Unspecified jaundice: Secondary | ICD-10-CM | POA: Diagnosis not present

## 2021-05-23 DIAGNOSIS — Z79899 Other long term (current) drug therapy: Secondary | ICD-10-CM | POA: Diagnosis not present

## 2021-05-23 DIAGNOSIS — Z992 Dependence on renal dialysis: Secondary | ICD-10-CM | POA: Diagnosis not present

## 2021-05-23 DIAGNOSIS — E440 Moderate protein-calorie malnutrition: Secondary | ICD-10-CM | POA: Diagnosis not present

## 2021-05-23 DIAGNOSIS — D631 Anemia in chronic kidney disease: Secondary | ICD-10-CM | POA: Diagnosis not present

## 2021-05-23 DIAGNOSIS — D509 Iron deficiency anemia, unspecified: Secondary | ICD-10-CM | POA: Diagnosis not present

## 2021-05-23 DIAGNOSIS — N2589 Other disorders resulting from impaired renal tubular function: Secondary | ICD-10-CM | POA: Diagnosis not present

## 2021-05-24 DIAGNOSIS — Z79899 Other long term (current) drug therapy: Secondary | ICD-10-CM | POA: Diagnosis not present

## 2021-05-24 DIAGNOSIS — E440 Moderate protein-calorie malnutrition: Secondary | ICD-10-CM | POA: Diagnosis not present

## 2021-05-24 DIAGNOSIS — N2581 Secondary hyperparathyroidism of renal origin: Secondary | ICD-10-CM | POA: Diagnosis not present

## 2021-05-24 DIAGNOSIS — D631 Anemia in chronic kidney disease: Secondary | ICD-10-CM | POA: Diagnosis not present

## 2021-05-24 DIAGNOSIS — D509 Iron deficiency anemia, unspecified: Secondary | ICD-10-CM | POA: Diagnosis not present

## 2021-05-24 DIAGNOSIS — N2589 Other disorders resulting from impaired renal tubular function: Secondary | ICD-10-CM | POA: Diagnosis not present

## 2021-05-24 DIAGNOSIS — R17 Unspecified jaundice: Secondary | ICD-10-CM | POA: Diagnosis not present

## 2021-05-24 DIAGNOSIS — N186 End stage renal disease: Secondary | ICD-10-CM | POA: Diagnosis not present

## 2021-05-24 DIAGNOSIS — Z992 Dependence on renal dialysis: Secondary | ICD-10-CM | POA: Diagnosis not present

## 2021-05-25 DIAGNOSIS — N2581 Secondary hyperparathyroidism of renal origin: Secondary | ICD-10-CM | POA: Diagnosis not present

## 2021-05-25 DIAGNOSIS — E440 Moderate protein-calorie malnutrition: Secondary | ICD-10-CM | POA: Diagnosis not present

## 2021-05-25 DIAGNOSIS — Z79899 Other long term (current) drug therapy: Secondary | ICD-10-CM | POA: Diagnosis not present

## 2021-05-25 DIAGNOSIS — D509 Iron deficiency anemia, unspecified: Secondary | ICD-10-CM | POA: Diagnosis not present

## 2021-05-25 DIAGNOSIS — D631 Anemia in chronic kidney disease: Secondary | ICD-10-CM | POA: Diagnosis not present

## 2021-05-25 DIAGNOSIS — N2589 Other disorders resulting from impaired renal tubular function: Secondary | ICD-10-CM | POA: Diagnosis not present

## 2021-05-25 DIAGNOSIS — Z992 Dependence on renal dialysis: Secondary | ICD-10-CM | POA: Diagnosis not present

## 2021-05-25 DIAGNOSIS — R17 Unspecified jaundice: Secondary | ICD-10-CM | POA: Diagnosis not present

## 2021-05-25 DIAGNOSIS — N186 End stage renal disease: Secondary | ICD-10-CM | POA: Diagnosis not present

## 2021-05-26 ENCOUNTER — Ambulatory Visit
Admission: RE | Admit: 2021-05-26 | Discharge: 2021-05-26 | Disposition: A | Discharge status: Home / Self Care | Payer: Medicare Other | Source: Ambulatory Visit | Attending: Nephrology | Admitting: Nephrology

## 2021-05-26 ENCOUNTER — Other Ambulatory Visit: Payer: Self-pay

## 2021-05-26 DIAGNOSIS — E440 Moderate protein-calorie malnutrition: Secondary | ICD-10-CM | POA: Diagnosis not present

## 2021-05-26 DIAGNOSIS — D631 Anemia in chronic kidney disease: Secondary | ICD-10-CM | POA: Diagnosis not present

## 2021-05-26 DIAGNOSIS — N186 End stage renal disease: Secondary | ICD-10-CM | POA: Insufficient documentation

## 2021-05-26 DIAGNOSIS — D509 Iron deficiency anemia, unspecified: Secondary | ICD-10-CM | POA: Diagnosis not present

## 2021-05-26 DIAGNOSIS — N2581 Secondary hyperparathyroidism of renal origin: Secondary | ICD-10-CM | POA: Diagnosis not present

## 2021-05-26 DIAGNOSIS — Z79899 Other long term (current) drug therapy: Secondary | ICD-10-CM | POA: Diagnosis not present

## 2021-05-26 DIAGNOSIS — Z992 Dependence on renal dialysis: Secondary | ICD-10-CM | POA: Diagnosis not present

## 2021-05-26 DIAGNOSIS — R17 Unspecified jaundice: Secondary | ICD-10-CM | POA: Diagnosis not present

## 2021-05-26 DIAGNOSIS — N2589 Other disorders resulting from impaired renal tubular function: Secondary | ICD-10-CM | POA: Diagnosis not present

## 2021-05-26 LAB — CBC
HCT: 24.3 % — ABNORMAL LOW (ref 39.0–52.0)
Hemoglobin: 8.2 g/dL — ABNORMAL LOW (ref 13.0–17.0)
MCH: 29.9 pg (ref 26.0–34.0)
MCHC: 33.7 g/dL (ref 30.0–36.0)
MCV: 88.7 fL (ref 80.0–100.0)
Platelets: 227 10*3/uL (ref 150–400)
RBC: 2.74 MIL/uL — ABNORMAL LOW (ref 4.22–5.81)
RDW: 16.2 % — ABNORMAL HIGH (ref 11.5–15.5)
WBC: 10.7 10*3/uL — ABNORMAL HIGH (ref 4.0–10.5)
nRBC: 0.4 % — ABNORMAL HIGH (ref 0.0–0.2)

## 2021-05-26 LAB — TYPE AND SCREEN
ABO/RH(D): B POS
Antibody Screen: NEGATIVE

## 2021-05-26 LAB — ABO/RH: ABO/RH(D): B POS

## 2021-05-26 LAB — PREPARE RBC (CROSSMATCH)

## 2021-05-26 MED ORDER — SODIUM CHLORIDE 0.9% IV SOLUTION: Freq: Once | INTRAVENOUS | Status: DC

## 2021-05-26 NOTE — Progress Notes (Signed)
Spoke with pt's Dialysis Rn [employer]. Regarding pt's hemoglobin level of 8.2. she stated that pt dose not need the blood transfusion and to cancel the order. Blood bank was also notified.  Pt verbalized fully understanding. Continue to monitor.

## 2021-05-27 DIAGNOSIS — R17 Unspecified jaundice: Secondary | ICD-10-CM | POA: Diagnosis not present

## 2021-05-27 DIAGNOSIS — Z79899 Other long term (current) drug therapy: Secondary | ICD-10-CM | POA: Diagnosis not present

## 2021-05-27 DIAGNOSIS — D509 Iron deficiency anemia, unspecified: Secondary | ICD-10-CM | POA: Diagnosis not present

## 2021-05-27 DIAGNOSIS — D631 Anemia in chronic kidney disease: Secondary | ICD-10-CM | POA: Diagnosis not present

## 2021-05-27 DIAGNOSIS — N2589 Other disorders resulting from impaired renal tubular function: Secondary | ICD-10-CM | POA: Diagnosis not present

## 2021-05-27 DIAGNOSIS — N2581 Secondary hyperparathyroidism of renal origin: Secondary | ICD-10-CM | POA: Diagnosis not present

## 2021-05-27 DIAGNOSIS — N186 End stage renal disease: Secondary | ICD-10-CM | POA: Diagnosis not present

## 2021-05-27 DIAGNOSIS — Z992 Dependence on renal dialysis: Secondary | ICD-10-CM | POA: Diagnosis not present

## 2021-05-27 DIAGNOSIS — E440 Moderate protein-calorie malnutrition: Secondary | ICD-10-CM | POA: Diagnosis not present

## 2021-05-28 ENCOUNTER — Ambulatory Visit: Payer: Medicare Other

## 2021-05-28 DIAGNOSIS — L97522 Non-pressure chronic ulcer of other part of left foot with fat layer exposed: Secondary | ICD-10-CM | POA: Diagnosis not present

## 2021-05-28 DIAGNOSIS — Z794 Long term (current) use of insulin: Secondary | ICD-10-CM | POA: Diagnosis not present

## 2021-05-28 DIAGNOSIS — E11621 Type 2 diabetes mellitus with foot ulcer: Secondary | ICD-10-CM | POA: Diagnosis not present

## 2021-05-28 DIAGNOSIS — Z79899 Other long term (current) drug therapy: Secondary | ICD-10-CM | POA: Diagnosis not present

## 2021-05-28 DIAGNOSIS — E440 Moderate protein-calorie malnutrition: Secondary | ICD-10-CM | POA: Diagnosis not present

## 2021-05-28 DIAGNOSIS — N186 End stage renal disease: Secondary | ICD-10-CM | POA: Diagnosis not present

## 2021-05-28 DIAGNOSIS — D509 Iron deficiency anemia, unspecified: Secondary | ICD-10-CM | POA: Diagnosis not present

## 2021-05-28 DIAGNOSIS — Z992 Dependence on renal dialysis: Secondary | ICD-10-CM | POA: Diagnosis not present

## 2021-05-28 DIAGNOSIS — R17 Unspecified jaundice: Secondary | ICD-10-CM | POA: Diagnosis not present

## 2021-05-28 DIAGNOSIS — N2581 Secondary hyperparathyroidism of renal origin: Secondary | ICD-10-CM | POA: Diagnosis not present

## 2021-05-28 DIAGNOSIS — D631 Anemia in chronic kidney disease: Secondary | ICD-10-CM | POA: Diagnosis not present

## 2021-05-28 DIAGNOSIS — L97509 Non-pressure chronic ulcer of other part of unspecified foot with unspecified severity: Secondary | ICD-10-CM | POA: Diagnosis not present

## 2021-05-28 DIAGNOSIS — N2589 Other disorders resulting from impaired renal tubular function: Secondary | ICD-10-CM | POA: Diagnosis not present

## 2021-05-29 DIAGNOSIS — R17 Unspecified jaundice: Secondary | ICD-10-CM | POA: Diagnosis not present

## 2021-05-29 DIAGNOSIS — E440 Moderate protein-calorie malnutrition: Secondary | ICD-10-CM | POA: Diagnosis not present

## 2021-05-29 DIAGNOSIS — D631 Anemia in chronic kidney disease: Secondary | ICD-10-CM | POA: Diagnosis not present

## 2021-05-29 DIAGNOSIS — D509 Iron deficiency anemia, unspecified: Secondary | ICD-10-CM | POA: Diagnosis not present

## 2021-05-29 DIAGNOSIS — N2581 Secondary hyperparathyroidism of renal origin: Secondary | ICD-10-CM | POA: Diagnosis not present

## 2021-05-29 DIAGNOSIS — Z992 Dependence on renal dialysis: Secondary | ICD-10-CM | POA: Diagnosis not present

## 2021-05-29 DIAGNOSIS — Z79899 Other long term (current) drug therapy: Secondary | ICD-10-CM | POA: Diagnosis not present

## 2021-05-29 DIAGNOSIS — N2589 Other disorders resulting from impaired renal tubular function: Secondary | ICD-10-CM | POA: Diagnosis not present

## 2021-05-29 DIAGNOSIS — N186 End stage renal disease: Secondary | ICD-10-CM | POA: Diagnosis not present

## 2021-05-30 DIAGNOSIS — D631 Anemia in chronic kidney disease: Secondary | ICD-10-CM | POA: Diagnosis not present

## 2021-05-30 DIAGNOSIS — R17 Unspecified jaundice: Secondary | ICD-10-CM | POA: Diagnosis not present

## 2021-05-30 DIAGNOSIS — N2581 Secondary hyperparathyroidism of renal origin: Secondary | ICD-10-CM | POA: Diagnosis not present

## 2021-05-30 DIAGNOSIS — Z992 Dependence on renal dialysis: Secondary | ICD-10-CM | POA: Diagnosis not present

## 2021-05-30 DIAGNOSIS — E440 Moderate protein-calorie malnutrition: Secondary | ICD-10-CM | POA: Diagnosis not present

## 2021-05-30 DIAGNOSIS — D509 Iron deficiency anemia, unspecified: Secondary | ICD-10-CM | POA: Diagnosis not present

## 2021-05-30 DIAGNOSIS — N2589 Other disorders resulting from impaired renal tubular function: Secondary | ICD-10-CM | POA: Diagnosis not present

## 2021-05-30 DIAGNOSIS — Z79899 Other long term (current) drug therapy: Secondary | ICD-10-CM | POA: Diagnosis not present

## 2021-05-30 DIAGNOSIS — N186 End stage renal disease: Secondary | ICD-10-CM | POA: Diagnosis not present

## 2021-05-31 DIAGNOSIS — D631 Anemia in chronic kidney disease: Secondary | ICD-10-CM | POA: Diagnosis not present

## 2021-05-31 DIAGNOSIS — R17 Unspecified jaundice: Secondary | ICD-10-CM | POA: Diagnosis not present

## 2021-05-31 DIAGNOSIS — N2581 Secondary hyperparathyroidism of renal origin: Secondary | ICD-10-CM | POA: Diagnosis not present

## 2021-05-31 DIAGNOSIS — E440 Moderate protein-calorie malnutrition: Secondary | ICD-10-CM | POA: Diagnosis not present

## 2021-05-31 DIAGNOSIS — D509 Iron deficiency anemia, unspecified: Secondary | ICD-10-CM | POA: Diagnosis not present

## 2021-05-31 DIAGNOSIS — Z79899 Other long term (current) drug therapy: Secondary | ICD-10-CM | POA: Diagnosis not present

## 2021-05-31 DIAGNOSIS — Z992 Dependence on renal dialysis: Secondary | ICD-10-CM | POA: Diagnosis not present

## 2021-05-31 DIAGNOSIS — N2589 Other disorders resulting from impaired renal tubular function: Secondary | ICD-10-CM | POA: Diagnosis not present

## 2021-05-31 DIAGNOSIS — N186 End stage renal disease: Secondary | ICD-10-CM | POA: Diagnosis not present

## 2021-06-01 DIAGNOSIS — E440 Moderate protein-calorie malnutrition: Secondary | ICD-10-CM | POA: Diagnosis not present

## 2021-06-01 DIAGNOSIS — D509 Iron deficiency anemia, unspecified: Secondary | ICD-10-CM | POA: Diagnosis not present

## 2021-06-01 DIAGNOSIS — Z992 Dependence on renal dialysis: Secondary | ICD-10-CM | POA: Diagnosis not present

## 2021-06-01 DIAGNOSIS — D631 Anemia in chronic kidney disease: Secondary | ICD-10-CM | POA: Diagnosis not present

## 2021-06-01 DIAGNOSIS — N2581 Secondary hyperparathyroidism of renal origin: Secondary | ICD-10-CM | POA: Diagnosis not present

## 2021-06-01 DIAGNOSIS — N2589 Other disorders resulting from impaired renal tubular function: Secondary | ICD-10-CM | POA: Diagnosis not present

## 2021-06-01 DIAGNOSIS — Z79899 Other long term (current) drug therapy: Secondary | ICD-10-CM | POA: Diagnosis not present

## 2021-06-01 DIAGNOSIS — N186 End stage renal disease: Secondary | ICD-10-CM | POA: Diagnosis not present

## 2021-06-01 DIAGNOSIS — R17 Unspecified jaundice: Secondary | ICD-10-CM | POA: Diagnosis not present

## 2021-06-02 DIAGNOSIS — N2589 Other disorders resulting from impaired renal tubular function: Secondary | ICD-10-CM | POA: Diagnosis not present

## 2021-06-02 DIAGNOSIS — E440 Moderate protein-calorie malnutrition: Secondary | ICD-10-CM | POA: Diagnosis not present

## 2021-06-02 DIAGNOSIS — R17 Unspecified jaundice: Secondary | ICD-10-CM | POA: Diagnosis not present

## 2021-06-02 DIAGNOSIS — D509 Iron deficiency anemia, unspecified: Secondary | ICD-10-CM | POA: Diagnosis not present

## 2021-06-02 DIAGNOSIS — D631 Anemia in chronic kidney disease: Secondary | ICD-10-CM | POA: Diagnosis not present

## 2021-06-02 DIAGNOSIS — Z79899 Other long term (current) drug therapy: Secondary | ICD-10-CM | POA: Diagnosis not present

## 2021-06-02 DIAGNOSIS — N186 End stage renal disease: Secondary | ICD-10-CM | POA: Diagnosis not present

## 2021-06-02 DIAGNOSIS — N2581 Secondary hyperparathyroidism of renal origin: Secondary | ICD-10-CM | POA: Diagnosis not present

## 2021-06-02 DIAGNOSIS — Z992 Dependence on renal dialysis: Secondary | ICD-10-CM | POA: Diagnosis not present

## 2021-06-03 DIAGNOSIS — Z79899 Other long term (current) drug therapy: Secondary | ICD-10-CM | POA: Diagnosis not present

## 2021-06-03 DIAGNOSIS — N186 End stage renal disease: Secondary | ICD-10-CM | POA: Diagnosis not present

## 2021-06-03 DIAGNOSIS — D631 Anemia in chronic kidney disease: Secondary | ICD-10-CM | POA: Diagnosis not present

## 2021-06-03 DIAGNOSIS — D509 Iron deficiency anemia, unspecified: Secondary | ICD-10-CM | POA: Diagnosis not present

## 2021-06-03 DIAGNOSIS — N2581 Secondary hyperparathyroidism of renal origin: Secondary | ICD-10-CM | POA: Diagnosis not present

## 2021-06-03 DIAGNOSIS — R17 Unspecified jaundice: Secondary | ICD-10-CM | POA: Diagnosis not present

## 2021-06-03 DIAGNOSIS — Z992 Dependence on renal dialysis: Secondary | ICD-10-CM | POA: Diagnosis not present

## 2021-06-03 DIAGNOSIS — N2589 Other disorders resulting from impaired renal tubular function: Secondary | ICD-10-CM | POA: Diagnosis not present

## 2021-06-03 DIAGNOSIS — E440 Moderate protein-calorie malnutrition: Secondary | ICD-10-CM | POA: Diagnosis not present

## 2021-06-04 DIAGNOSIS — N2589 Other disorders resulting from impaired renal tubular function: Secondary | ICD-10-CM | POA: Diagnosis not present

## 2021-06-04 DIAGNOSIS — Z794 Long term (current) use of insulin: Secondary | ICD-10-CM | POA: Diagnosis not present

## 2021-06-04 DIAGNOSIS — Z992 Dependence on renal dialysis: Secondary | ICD-10-CM | POA: Diagnosis not present

## 2021-06-04 DIAGNOSIS — R17 Unspecified jaundice: Secondary | ICD-10-CM | POA: Diagnosis not present

## 2021-06-04 DIAGNOSIS — N186 End stage renal disease: Secondary | ICD-10-CM | POA: Diagnosis not present

## 2021-06-04 DIAGNOSIS — L97512 Non-pressure chronic ulcer of other part of right foot with fat layer exposed: Secondary | ICD-10-CM | POA: Diagnosis not present

## 2021-06-04 DIAGNOSIS — D631 Anemia in chronic kidney disease: Secondary | ICD-10-CM | POA: Diagnosis not present

## 2021-06-04 DIAGNOSIS — D509 Iron deficiency anemia, unspecified: Secondary | ICD-10-CM | POA: Diagnosis not present

## 2021-06-04 DIAGNOSIS — Z79899 Other long term (current) drug therapy: Secondary | ICD-10-CM | POA: Diagnosis not present

## 2021-06-04 DIAGNOSIS — E440 Moderate protein-calorie malnutrition: Secondary | ICD-10-CM | POA: Diagnosis not present

## 2021-06-04 DIAGNOSIS — N2581 Secondary hyperparathyroidism of renal origin: Secondary | ICD-10-CM | POA: Diagnosis not present

## 2021-06-04 DIAGNOSIS — E11621 Type 2 diabetes mellitus with foot ulcer: Secondary | ICD-10-CM | POA: Diagnosis not present

## 2021-06-05 DIAGNOSIS — N186 End stage renal disease: Secondary | ICD-10-CM | POA: Diagnosis not present

## 2021-06-05 DIAGNOSIS — D631 Anemia in chronic kidney disease: Secondary | ICD-10-CM | POA: Diagnosis not present

## 2021-06-05 DIAGNOSIS — N2589 Other disorders resulting from impaired renal tubular function: Secondary | ICD-10-CM | POA: Diagnosis not present

## 2021-06-05 DIAGNOSIS — Z79899 Other long term (current) drug therapy: Secondary | ICD-10-CM | POA: Diagnosis not present

## 2021-06-05 DIAGNOSIS — D509 Iron deficiency anemia, unspecified: Secondary | ICD-10-CM | POA: Diagnosis not present

## 2021-06-05 DIAGNOSIS — N2581 Secondary hyperparathyroidism of renal origin: Secondary | ICD-10-CM | POA: Diagnosis not present

## 2021-06-05 DIAGNOSIS — Z992 Dependence on renal dialysis: Secondary | ICD-10-CM | POA: Diagnosis not present

## 2021-06-05 DIAGNOSIS — E440 Moderate protein-calorie malnutrition: Secondary | ICD-10-CM | POA: Diagnosis not present

## 2021-06-05 DIAGNOSIS — R17 Unspecified jaundice: Secondary | ICD-10-CM | POA: Diagnosis not present

## 2021-06-06 DIAGNOSIS — R17 Unspecified jaundice: Secondary | ICD-10-CM | POA: Diagnosis not present

## 2021-06-06 DIAGNOSIS — N2581 Secondary hyperparathyroidism of renal origin: Secondary | ICD-10-CM | POA: Diagnosis not present

## 2021-06-06 DIAGNOSIS — Z79899 Other long term (current) drug therapy: Secondary | ICD-10-CM | POA: Diagnosis not present

## 2021-06-06 DIAGNOSIS — N186 End stage renal disease: Secondary | ICD-10-CM | POA: Diagnosis not present

## 2021-06-06 DIAGNOSIS — E440 Moderate protein-calorie malnutrition: Secondary | ICD-10-CM | POA: Diagnosis not present

## 2021-06-06 DIAGNOSIS — Z992 Dependence on renal dialysis: Secondary | ICD-10-CM | POA: Diagnosis not present

## 2021-06-06 DIAGNOSIS — D509 Iron deficiency anemia, unspecified: Secondary | ICD-10-CM | POA: Diagnosis not present

## 2021-06-06 DIAGNOSIS — N2589 Other disorders resulting from impaired renal tubular function: Secondary | ICD-10-CM | POA: Diagnosis not present

## 2021-06-06 DIAGNOSIS — D631 Anemia in chronic kidney disease: Secondary | ICD-10-CM | POA: Diagnosis not present

## 2021-06-07 DIAGNOSIS — E440 Moderate protein-calorie malnutrition: Secondary | ICD-10-CM | POA: Diagnosis not present

## 2021-06-07 DIAGNOSIS — R17 Unspecified jaundice: Secondary | ICD-10-CM | POA: Diagnosis not present

## 2021-06-07 DIAGNOSIS — N2589 Other disorders resulting from impaired renal tubular function: Secondary | ICD-10-CM | POA: Diagnosis not present

## 2021-06-07 DIAGNOSIS — N2581 Secondary hyperparathyroidism of renal origin: Secondary | ICD-10-CM | POA: Diagnosis not present

## 2021-06-07 DIAGNOSIS — D631 Anemia in chronic kidney disease: Secondary | ICD-10-CM | POA: Diagnosis not present

## 2021-06-07 DIAGNOSIS — D509 Iron deficiency anemia, unspecified: Secondary | ICD-10-CM | POA: Diagnosis not present

## 2021-06-07 DIAGNOSIS — E1122 Type 2 diabetes mellitus with diabetic chronic kidney disease: Secondary | ICD-10-CM | POA: Diagnosis not present

## 2021-06-07 DIAGNOSIS — Z79899 Other long term (current) drug therapy: Secondary | ICD-10-CM | POA: Diagnosis not present

## 2021-06-07 DIAGNOSIS — N186 End stage renal disease: Secondary | ICD-10-CM | POA: Diagnosis not present

## 2021-06-07 DIAGNOSIS — Z992 Dependence on renal dialysis: Secondary | ICD-10-CM | POA: Diagnosis not present

## 2021-06-08 DIAGNOSIS — E440 Moderate protein-calorie malnutrition: Secondary | ICD-10-CM | POA: Diagnosis not present

## 2021-06-08 DIAGNOSIS — Z79899 Other long term (current) drug therapy: Secondary | ICD-10-CM | POA: Diagnosis not present

## 2021-06-08 DIAGNOSIS — Z992 Dependence on renal dialysis: Secondary | ICD-10-CM | POA: Diagnosis not present

## 2021-06-08 DIAGNOSIS — N186 End stage renal disease: Secondary | ICD-10-CM | POA: Diagnosis not present

## 2021-06-08 DIAGNOSIS — D631 Anemia in chronic kidney disease: Secondary | ICD-10-CM | POA: Diagnosis not present

## 2021-06-08 DIAGNOSIS — R17 Unspecified jaundice: Secondary | ICD-10-CM | POA: Diagnosis not present

## 2021-06-08 DIAGNOSIS — D509 Iron deficiency anemia, unspecified: Secondary | ICD-10-CM | POA: Diagnosis not present

## 2021-06-08 DIAGNOSIS — N2581 Secondary hyperparathyroidism of renal origin: Secondary | ICD-10-CM | POA: Diagnosis not present

## 2021-06-08 DIAGNOSIS — N2589 Other disorders resulting from impaired renal tubular function: Secondary | ICD-10-CM | POA: Diagnosis not present

## 2021-06-09 DIAGNOSIS — D509 Iron deficiency anemia, unspecified: Secondary | ICD-10-CM | POA: Diagnosis not present

## 2021-06-09 DIAGNOSIS — N2581 Secondary hyperparathyroidism of renal origin: Secondary | ICD-10-CM | POA: Diagnosis not present

## 2021-06-09 DIAGNOSIS — N2589 Other disorders resulting from impaired renal tubular function: Secondary | ICD-10-CM | POA: Diagnosis not present

## 2021-06-09 DIAGNOSIS — Z992 Dependence on renal dialysis: Secondary | ICD-10-CM | POA: Diagnosis not present

## 2021-06-09 DIAGNOSIS — D631 Anemia in chronic kidney disease: Secondary | ICD-10-CM | POA: Diagnosis not present

## 2021-06-09 DIAGNOSIS — R17 Unspecified jaundice: Secondary | ICD-10-CM | POA: Diagnosis not present

## 2021-06-09 DIAGNOSIS — E440 Moderate protein-calorie malnutrition: Secondary | ICD-10-CM | POA: Diagnosis not present

## 2021-06-09 DIAGNOSIS — N186 End stage renal disease: Secondary | ICD-10-CM | POA: Diagnosis not present

## 2021-06-09 DIAGNOSIS — Z79899 Other long term (current) drug therapy: Secondary | ICD-10-CM | POA: Diagnosis not present

## 2021-06-10 DIAGNOSIS — D631 Anemia in chronic kidney disease: Secondary | ICD-10-CM | POA: Diagnosis not present

## 2021-06-10 DIAGNOSIS — E440 Moderate protein-calorie malnutrition: Secondary | ICD-10-CM | POA: Diagnosis not present

## 2021-06-10 DIAGNOSIS — D509 Iron deficiency anemia, unspecified: Secondary | ICD-10-CM | POA: Diagnosis not present

## 2021-06-10 DIAGNOSIS — Z79899 Other long term (current) drug therapy: Secondary | ICD-10-CM | POA: Diagnosis not present

## 2021-06-10 DIAGNOSIS — N2581 Secondary hyperparathyroidism of renal origin: Secondary | ICD-10-CM | POA: Diagnosis not present

## 2021-06-10 DIAGNOSIS — Z992 Dependence on renal dialysis: Secondary | ICD-10-CM | POA: Diagnosis not present

## 2021-06-10 DIAGNOSIS — R17 Unspecified jaundice: Secondary | ICD-10-CM | POA: Diagnosis not present

## 2021-06-10 DIAGNOSIS — N2589 Other disorders resulting from impaired renal tubular function: Secondary | ICD-10-CM | POA: Diagnosis not present

## 2021-06-10 DIAGNOSIS — N186 End stage renal disease: Secondary | ICD-10-CM | POA: Diagnosis not present

## 2021-06-11 DIAGNOSIS — D631 Anemia in chronic kidney disease: Secondary | ICD-10-CM | POA: Diagnosis not present

## 2021-06-11 DIAGNOSIS — N2581 Secondary hyperparathyroidism of renal origin: Secondary | ICD-10-CM | POA: Diagnosis not present

## 2021-06-11 DIAGNOSIS — N186 End stage renal disease: Secondary | ICD-10-CM | POA: Diagnosis not present

## 2021-06-11 DIAGNOSIS — E440 Moderate protein-calorie malnutrition: Secondary | ICD-10-CM | POA: Diagnosis not present

## 2021-06-11 DIAGNOSIS — Z79899 Other long term (current) drug therapy: Secondary | ICD-10-CM | POA: Diagnosis not present

## 2021-06-11 DIAGNOSIS — D509 Iron deficiency anemia, unspecified: Secondary | ICD-10-CM | POA: Diagnosis not present

## 2021-06-11 DIAGNOSIS — R17 Unspecified jaundice: Secondary | ICD-10-CM | POA: Diagnosis not present

## 2021-06-11 DIAGNOSIS — N2589 Other disorders resulting from impaired renal tubular function: Secondary | ICD-10-CM | POA: Diagnosis not present

## 2021-06-11 DIAGNOSIS — Z992 Dependence on renal dialysis: Secondary | ICD-10-CM | POA: Diagnosis not present

## 2021-06-12 DIAGNOSIS — Z992 Dependence on renal dialysis: Secondary | ICD-10-CM | POA: Diagnosis not present

## 2021-06-12 DIAGNOSIS — E440 Moderate protein-calorie malnutrition: Secondary | ICD-10-CM | POA: Diagnosis not present

## 2021-06-12 DIAGNOSIS — N186 End stage renal disease: Secondary | ICD-10-CM | POA: Diagnosis not present

## 2021-06-12 DIAGNOSIS — Z79899 Other long term (current) drug therapy: Secondary | ICD-10-CM | POA: Diagnosis not present

## 2021-06-12 DIAGNOSIS — N2581 Secondary hyperparathyroidism of renal origin: Secondary | ICD-10-CM | POA: Diagnosis not present

## 2021-06-12 DIAGNOSIS — R17 Unspecified jaundice: Secondary | ICD-10-CM | POA: Diagnosis not present

## 2021-06-12 DIAGNOSIS — N2589 Other disorders resulting from impaired renal tubular function: Secondary | ICD-10-CM | POA: Diagnosis not present

## 2021-06-12 DIAGNOSIS — D509 Iron deficiency anemia, unspecified: Secondary | ICD-10-CM | POA: Diagnosis not present

## 2021-06-12 DIAGNOSIS — D631 Anemia in chronic kidney disease: Secondary | ICD-10-CM | POA: Diagnosis not present

## 2021-06-13 DIAGNOSIS — N186 End stage renal disease: Secondary | ICD-10-CM | POA: Diagnosis not present

## 2021-06-13 DIAGNOSIS — E440 Moderate protein-calorie malnutrition: Secondary | ICD-10-CM | POA: Diagnosis not present

## 2021-06-13 DIAGNOSIS — Z79899 Other long term (current) drug therapy: Secondary | ICD-10-CM | POA: Diagnosis not present

## 2021-06-13 DIAGNOSIS — N2581 Secondary hyperparathyroidism of renal origin: Secondary | ICD-10-CM | POA: Diagnosis not present

## 2021-06-13 DIAGNOSIS — R17 Unspecified jaundice: Secondary | ICD-10-CM | POA: Diagnosis not present

## 2021-06-13 DIAGNOSIS — D631 Anemia in chronic kidney disease: Secondary | ICD-10-CM | POA: Diagnosis not present

## 2021-06-13 DIAGNOSIS — N2589 Other disorders resulting from impaired renal tubular function: Secondary | ICD-10-CM | POA: Diagnosis not present

## 2021-06-13 DIAGNOSIS — Z992 Dependence on renal dialysis: Secondary | ICD-10-CM | POA: Diagnosis not present

## 2021-06-13 DIAGNOSIS — D509 Iron deficiency anemia, unspecified: Secondary | ICD-10-CM | POA: Diagnosis not present

## 2021-06-14 DIAGNOSIS — N2581 Secondary hyperparathyroidism of renal origin: Secondary | ICD-10-CM | POA: Diagnosis not present

## 2021-06-14 DIAGNOSIS — D631 Anemia in chronic kidney disease: Secondary | ICD-10-CM | POA: Diagnosis not present

## 2021-06-14 DIAGNOSIS — D509 Iron deficiency anemia, unspecified: Secondary | ICD-10-CM | POA: Diagnosis not present

## 2021-06-14 DIAGNOSIS — N2589 Other disorders resulting from impaired renal tubular function: Secondary | ICD-10-CM | POA: Diagnosis not present

## 2021-06-14 DIAGNOSIS — N186 End stage renal disease: Secondary | ICD-10-CM | POA: Diagnosis not present

## 2021-06-14 DIAGNOSIS — Z992 Dependence on renal dialysis: Secondary | ICD-10-CM | POA: Diagnosis not present

## 2021-06-14 DIAGNOSIS — R17 Unspecified jaundice: Secondary | ICD-10-CM | POA: Diagnosis not present

## 2021-06-14 DIAGNOSIS — E440 Moderate protein-calorie malnutrition: Secondary | ICD-10-CM | POA: Diagnosis not present

## 2021-06-14 DIAGNOSIS — Z79899 Other long term (current) drug therapy: Secondary | ICD-10-CM | POA: Diagnosis not present

## 2021-06-15 DIAGNOSIS — R17 Unspecified jaundice: Secondary | ICD-10-CM | POA: Diagnosis not present

## 2021-06-15 DIAGNOSIS — N186 End stage renal disease: Secondary | ICD-10-CM | POA: Diagnosis not present

## 2021-06-15 DIAGNOSIS — Z992 Dependence on renal dialysis: Secondary | ICD-10-CM | POA: Diagnosis not present

## 2021-06-15 DIAGNOSIS — N2589 Other disorders resulting from impaired renal tubular function: Secondary | ICD-10-CM | POA: Diagnosis not present

## 2021-06-15 DIAGNOSIS — D509 Iron deficiency anemia, unspecified: Secondary | ICD-10-CM | POA: Diagnosis not present

## 2021-06-15 DIAGNOSIS — Z79899 Other long term (current) drug therapy: Secondary | ICD-10-CM | POA: Diagnosis not present

## 2021-06-15 DIAGNOSIS — E440 Moderate protein-calorie malnutrition: Secondary | ICD-10-CM | POA: Diagnosis not present

## 2021-06-15 DIAGNOSIS — D631 Anemia in chronic kidney disease: Secondary | ICD-10-CM | POA: Diagnosis not present

## 2021-06-15 DIAGNOSIS — N2581 Secondary hyperparathyroidism of renal origin: Secondary | ICD-10-CM | POA: Diagnosis not present

## 2021-06-16 DIAGNOSIS — Z79899 Other long term (current) drug therapy: Secondary | ICD-10-CM | POA: Diagnosis not present

## 2021-06-16 DIAGNOSIS — R17 Unspecified jaundice: Secondary | ICD-10-CM | POA: Diagnosis not present

## 2021-06-16 DIAGNOSIS — N186 End stage renal disease: Secondary | ICD-10-CM | POA: Diagnosis not present

## 2021-06-16 DIAGNOSIS — D509 Iron deficiency anemia, unspecified: Secondary | ICD-10-CM | POA: Diagnosis not present

## 2021-06-16 DIAGNOSIS — N2581 Secondary hyperparathyroidism of renal origin: Secondary | ICD-10-CM | POA: Diagnosis not present

## 2021-06-16 DIAGNOSIS — D631 Anemia in chronic kidney disease: Secondary | ICD-10-CM | POA: Diagnosis not present

## 2021-06-16 DIAGNOSIS — E440 Moderate protein-calorie malnutrition: Secondary | ICD-10-CM | POA: Diagnosis not present

## 2021-06-16 DIAGNOSIS — Z992 Dependence on renal dialysis: Secondary | ICD-10-CM | POA: Diagnosis not present

## 2021-06-16 DIAGNOSIS — N2589 Other disorders resulting from impaired renal tubular function: Secondary | ICD-10-CM | POA: Diagnosis not present

## 2021-06-17 DIAGNOSIS — N186 End stage renal disease: Secondary | ICD-10-CM | POA: Diagnosis not present

## 2021-06-17 DIAGNOSIS — N2589 Other disorders resulting from impaired renal tubular function: Secondary | ICD-10-CM | POA: Diagnosis not present

## 2021-06-17 DIAGNOSIS — N2581 Secondary hyperparathyroidism of renal origin: Secondary | ICD-10-CM | POA: Diagnosis not present

## 2021-06-17 DIAGNOSIS — E440 Moderate protein-calorie malnutrition: Secondary | ICD-10-CM | POA: Diagnosis not present

## 2021-06-17 DIAGNOSIS — R17 Unspecified jaundice: Secondary | ICD-10-CM | POA: Diagnosis not present

## 2021-06-17 DIAGNOSIS — Z79899 Other long term (current) drug therapy: Secondary | ICD-10-CM | POA: Diagnosis not present

## 2021-06-17 DIAGNOSIS — D509 Iron deficiency anemia, unspecified: Secondary | ICD-10-CM | POA: Diagnosis not present

## 2021-06-17 DIAGNOSIS — D631 Anemia in chronic kidney disease: Secondary | ICD-10-CM | POA: Diagnosis not present

## 2021-06-17 DIAGNOSIS — Z992 Dependence on renal dialysis: Secondary | ICD-10-CM | POA: Diagnosis not present

## 2021-06-18 DIAGNOSIS — E440 Moderate protein-calorie malnutrition: Secondary | ICD-10-CM | POA: Diagnosis not present

## 2021-06-18 DIAGNOSIS — N186 End stage renal disease: Secondary | ICD-10-CM | POA: Diagnosis not present

## 2021-06-18 DIAGNOSIS — R17 Unspecified jaundice: Secondary | ICD-10-CM | POA: Diagnosis not present

## 2021-06-18 DIAGNOSIS — Z79899 Other long term (current) drug therapy: Secondary | ICD-10-CM | POA: Diagnosis not present

## 2021-06-18 DIAGNOSIS — Z992 Dependence on renal dialysis: Secondary | ICD-10-CM | POA: Diagnosis not present

## 2021-06-18 DIAGNOSIS — D509 Iron deficiency anemia, unspecified: Secondary | ICD-10-CM | POA: Diagnosis not present

## 2021-06-18 DIAGNOSIS — Z794 Long term (current) use of insulin: Secondary | ICD-10-CM | POA: Diagnosis not present

## 2021-06-18 DIAGNOSIS — L97522 Non-pressure chronic ulcer of other part of left foot with fat layer exposed: Secondary | ICD-10-CM | POA: Diagnosis not present

## 2021-06-18 DIAGNOSIS — N2581 Secondary hyperparathyroidism of renal origin: Secondary | ICD-10-CM | POA: Diagnosis not present

## 2021-06-18 DIAGNOSIS — E11621 Type 2 diabetes mellitus with foot ulcer: Secondary | ICD-10-CM | POA: Diagnosis not present

## 2021-06-18 DIAGNOSIS — N2589 Other disorders resulting from impaired renal tubular function: Secondary | ICD-10-CM | POA: Diagnosis not present

## 2021-06-18 DIAGNOSIS — D631 Anemia in chronic kidney disease: Secondary | ICD-10-CM | POA: Diagnosis not present

## 2021-06-19 DIAGNOSIS — D509 Iron deficiency anemia, unspecified: Secondary | ICD-10-CM | POA: Diagnosis not present

## 2021-06-19 DIAGNOSIS — Z79899 Other long term (current) drug therapy: Secondary | ICD-10-CM | POA: Diagnosis not present

## 2021-06-19 DIAGNOSIS — D631 Anemia in chronic kidney disease: Secondary | ICD-10-CM | POA: Diagnosis not present

## 2021-06-19 DIAGNOSIS — N2589 Other disorders resulting from impaired renal tubular function: Secondary | ICD-10-CM | POA: Diagnosis not present

## 2021-06-19 DIAGNOSIS — Z992 Dependence on renal dialysis: Secondary | ICD-10-CM | POA: Diagnosis not present

## 2021-06-19 DIAGNOSIS — E440 Moderate protein-calorie malnutrition: Secondary | ICD-10-CM | POA: Diagnosis not present

## 2021-06-19 DIAGNOSIS — R17 Unspecified jaundice: Secondary | ICD-10-CM | POA: Diagnosis not present

## 2021-06-19 DIAGNOSIS — N2581 Secondary hyperparathyroidism of renal origin: Secondary | ICD-10-CM | POA: Diagnosis not present

## 2021-06-19 DIAGNOSIS — N186 End stage renal disease: Secondary | ICD-10-CM | POA: Diagnosis not present

## 2021-06-20 DIAGNOSIS — Z992 Dependence on renal dialysis: Secondary | ICD-10-CM | POA: Diagnosis not present

## 2021-06-20 DIAGNOSIS — N2581 Secondary hyperparathyroidism of renal origin: Secondary | ICD-10-CM | POA: Diagnosis not present

## 2021-06-20 DIAGNOSIS — E440 Moderate protein-calorie malnutrition: Secondary | ICD-10-CM | POA: Diagnosis not present

## 2021-06-20 DIAGNOSIS — N2589 Other disorders resulting from impaired renal tubular function: Secondary | ICD-10-CM | POA: Diagnosis not present

## 2021-06-20 DIAGNOSIS — R17 Unspecified jaundice: Secondary | ICD-10-CM | POA: Diagnosis not present

## 2021-06-20 DIAGNOSIS — Z79899 Other long term (current) drug therapy: Secondary | ICD-10-CM | POA: Diagnosis not present

## 2021-06-20 DIAGNOSIS — D631 Anemia in chronic kidney disease: Secondary | ICD-10-CM | POA: Diagnosis not present

## 2021-06-20 DIAGNOSIS — D509 Iron deficiency anemia, unspecified: Secondary | ICD-10-CM | POA: Diagnosis not present

## 2021-06-20 DIAGNOSIS — N186 End stage renal disease: Secondary | ICD-10-CM | POA: Diagnosis not present

## 2021-06-21 DIAGNOSIS — D509 Iron deficiency anemia, unspecified: Secondary | ICD-10-CM | POA: Diagnosis not present

## 2021-06-21 DIAGNOSIS — E440 Moderate protein-calorie malnutrition: Secondary | ICD-10-CM | POA: Diagnosis not present

## 2021-06-21 DIAGNOSIS — N2589 Other disorders resulting from impaired renal tubular function: Secondary | ICD-10-CM | POA: Diagnosis not present

## 2021-06-21 DIAGNOSIS — D631 Anemia in chronic kidney disease: Secondary | ICD-10-CM | POA: Diagnosis not present

## 2021-06-21 DIAGNOSIS — N186 End stage renal disease: Secondary | ICD-10-CM | POA: Diagnosis not present

## 2021-06-21 DIAGNOSIS — Z79899 Other long term (current) drug therapy: Secondary | ICD-10-CM | POA: Diagnosis not present

## 2021-06-21 DIAGNOSIS — Z992 Dependence on renal dialysis: Secondary | ICD-10-CM | POA: Diagnosis not present

## 2021-06-21 DIAGNOSIS — N2581 Secondary hyperparathyroidism of renal origin: Secondary | ICD-10-CM | POA: Diagnosis not present

## 2021-06-21 DIAGNOSIS — R17 Unspecified jaundice: Secondary | ICD-10-CM | POA: Diagnosis not present

## 2021-06-22 DIAGNOSIS — N2581 Secondary hyperparathyroidism of renal origin: Secondary | ICD-10-CM | POA: Diagnosis not present

## 2021-06-22 DIAGNOSIS — N186 End stage renal disease: Secondary | ICD-10-CM | POA: Diagnosis not present

## 2021-06-22 DIAGNOSIS — Z992 Dependence on renal dialysis: Secondary | ICD-10-CM | POA: Diagnosis not present

## 2021-06-22 DIAGNOSIS — D509 Iron deficiency anemia, unspecified: Secondary | ICD-10-CM | POA: Diagnosis not present

## 2021-06-22 DIAGNOSIS — E440 Moderate protein-calorie malnutrition: Secondary | ICD-10-CM | POA: Diagnosis not present

## 2021-06-22 DIAGNOSIS — D631 Anemia in chronic kidney disease: Secondary | ICD-10-CM | POA: Diagnosis not present

## 2021-06-22 DIAGNOSIS — R17 Unspecified jaundice: Secondary | ICD-10-CM | POA: Diagnosis not present

## 2021-06-22 DIAGNOSIS — Z79899 Other long term (current) drug therapy: Secondary | ICD-10-CM | POA: Diagnosis not present

## 2021-06-22 DIAGNOSIS — N2589 Other disorders resulting from impaired renal tubular function: Secondary | ICD-10-CM | POA: Diagnosis not present

## 2021-06-23 DIAGNOSIS — N2589 Other disorders resulting from impaired renal tubular function: Secondary | ICD-10-CM | POA: Diagnosis not present

## 2021-06-23 DIAGNOSIS — R17 Unspecified jaundice: Secondary | ICD-10-CM | POA: Diagnosis not present

## 2021-06-23 DIAGNOSIS — D631 Anemia in chronic kidney disease: Secondary | ICD-10-CM | POA: Diagnosis not present

## 2021-06-23 DIAGNOSIS — Z79899 Other long term (current) drug therapy: Secondary | ICD-10-CM | POA: Diagnosis not present

## 2021-06-23 DIAGNOSIS — N2581 Secondary hyperparathyroidism of renal origin: Secondary | ICD-10-CM | POA: Diagnosis not present

## 2021-06-23 DIAGNOSIS — E440 Moderate protein-calorie malnutrition: Secondary | ICD-10-CM | POA: Diagnosis not present

## 2021-06-23 DIAGNOSIS — D509 Iron deficiency anemia, unspecified: Secondary | ICD-10-CM | POA: Diagnosis not present

## 2021-06-23 DIAGNOSIS — Z992 Dependence on renal dialysis: Secondary | ICD-10-CM | POA: Diagnosis not present

## 2021-06-23 DIAGNOSIS — N186 End stage renal disease: Secondary | ICD-10-CM | POA: Diagnosis not present

## 2021-06-24 DIAGNOSIS — E440 Moderate protein-calorie malnutrition: Secondary | ICD-10-CM | POA: Diagnosis not present

## 2021-06-24 DIAGNOSIS — Z992 Dependence on renal dialysis: Secondary | ICD-10-CM | POA: Diagnosis not present

## 2021-06-24 DIAGNOSIS — N2581 Secondary hyperparathyroidism of renal origin: Secondary | ICD-10-CM | POA: Diagnosis not present

## 2021-06-24 DIAGNOSIS — D631 Anemia in chronic kidney disease: Secondary | ICD-10-CM | POA: Diagnosis not present

## 2021-06-24 DIAGNOSIS — Z79899 Other long term (current) drug therapy: Secondary | ICD-10-CM | POA: Diagnosis not present

## 2021-06-24 DIAGNOSIS — N186 End stage renal disease: Secondary | ICD-10-CM | POA: Diagnosis not present

## 2021-06-24 DIAGNOSIS — D509 Iron deficiency anemia, unspecified: Secondary | ICD-10-CM | POA: Diagnosis not present

## 2021-06-24 DIAGNOSIS — R17 Unspecified jaundice: Secondary | ICD-10-CM | POA: Diagnosis not present

## 2021-06-24 DIAGNOSIS — N2589 Other disorders resulting from impaired renal tubular function: Secondary | ICD-10-CM | POA: Diagnosis not present

## 2021-06-25 DIAGNOSIS — D631 Anemia in chronic kidney disease: Secondary | ICD-10-CM | POA: Diagnosis not present

## 2021-06-25 DIAGNOSIS — N2581 Secondary hyperparathyroidism of renal origin: Secondary | ICD-10-CM | POA: Diagnosis not present

## 2021-06-25 DIAGNOSIS — R17 Unspecified jaundice: Secondary | ICD-10-CM | POA: Diagnosis not present

## 2021-06-25 DIAGNOSIS — Z79899 Other long term (current) drug therapy: Secondary | ICD-10-CM | POA: Diagnosis not present

## 2021-06-25 DIAGNOSIS — Z992 Dependence on renal dialysis: Secondary | ICD-10-CM | POA: Diagnosis not present

## 2021-06-25 DIAGNOSIS — D509 Iron deficiency anemia, unspecified: Secondary | ICD-10-CM | POA: Diagnosis not present

## 2021-06-25 DIAGNOSIS — N2589 Other disorders resulting from impaired renal tubular function: Secondary | ICD-10-CM | POA: Diagnosis not present

## 2021-06-25 DIAGNOSIS — E440 Moderate protein-calorie malnutrition: Secondary | ICD-10-CM | POA: Diagnosis not present

## 2021-06-25 DIAGNOSIS — N186 End stage renal disease: Secondary | ICD-10-CM | POA: Diagnosis not present

## 2021-06-26 DIAGNOSIS — D631 Anemia in chronic kidney disease: Secondary | ICD-10-CM | POA: Diagnosis not present

## 2021-06-26 DIAGNOSIS — N186 End stage renal disease: Secondary | ICD-10-CM | POA: Diagnosis not present

## 2021-06-26 DIAGNOSIS — N2581 Secondary hyperparathyroidism of renal origin: Secondary | ICD-10-CM | POA: Diagnosis not present

## 2021-06-26 DIAGNOSIS — Z992 Dependence on renal dialysis: Secondary | ICD-10-CM | POA: Diagnosis not present

## 2021-06-26 DIAGNOSIS — N2589 Other disorders resulting from impaired renal tubular function: Secondary | ICD-10-CM | POA: Diagnosis not present

## 2021-06-26 DIAGNOSIS — D509 Iron deficiency anemia, unspecified: Secondary | ICD-10-CM | POA: Diagnosis not present

## 2021-06-26 DIAGNOSIS — R17 Unspecified jaundice: Secondary | ICD-10-CM | POA: Diagnosis not present

## 2021-06-26 DIAGNOSIS — E440 Moderate protein-calorie malnutrition: Secondary | ICD-10-CM | POA: Diagnosis not present

## 2021-06-26 DIAGNOSIS — Z79899 Other long term (current) drug therapy: Secondary | ICD-10-CM | POA: Diagnosis not present

## 2021-06-27 DIAGNOSIS — D631 Anemia in chronic kidney disease: Secondary | ICD-10-CM | POA: Diagnosis not present

## 2021-06-27 DIAGNOSIS — D509 Iron deficiency anemia, unspecified: Secondary | ICD-10-CM | POA: Diagnosis not present

## 2021-06-27 DIAGNOSIS — Z79899 Other long term (current) drug therapy: Secondary | ICD-10-CM | POA: Diagnosis not present

## 2021-06-27 DIAGNOSIS — N2581 Secondary hyperparathyroidism of renal origin: Secondary | ICD-10-CM | POA: Diagnosis not present

## 2021-06-27 DIAGNOSIS — Z992 Dependence on renal dialysis: Secondary | ICD-10-CM | POA: Diagnosis not present

## 2021-06-27 DIAGNOSIS — N186 End stage renal disease: Secondary | ICD-10-CM | POA: Diagnosis not present

## 2021-06-27 DIAGNOSIS — R17 Unspecified jaundice: Secondary | ICD-10-CM | POA: Diagnosis not present

## 2021-06-27 DIAGNOSIS — N2589 Other disorders resulting from impaired renal tubular function: Secondary | ICD-10-CM | POA: Diagnosis not present

## 2021-06-27 DIAGNOSIS — E440 Moderate protein-calorie malnutrition: Secondary | ICD-10-CM | POA: Diagnosis not present

## 2021-06-28 DIAGNOSIS — R17 Unspecified jaundice: Secondary | ICD-10-CM | POA: Diagnosis not present

## 2021-06-28 DIAGNOSIS — D631 Anemia in chronic kidney disease: Secondary | ICD-10-CM | POA: Diagnosis not present

## 2021-06-28 DIAGNOSIS — E440 Moderate protein-calorie malnutrition: Secondary | ICD-10-CM | POA: Diagnosis not present

## 2021-06-28 DIAGNOSIS — N2589 Other disorders resulting from impaired renal tubular function: Secondary | ICD-10-CM | POA: Diagnosis not present

## 2021-06-28 DIAGNOSIS — N186 End stage renal disease: Secondary | ICD-10-CM | POA: Diagnosis not present

## 2021-06-28 DIAGNOSIS — Z992 Dependence on renal dialysis: Secondary | ICD-10-CM | POA: Diagnosis not present

## 2021-06-28 DIAGNOSIS — D509 Iron deficiency anemia, unspecified: Secondary | ICD-10-CM | POA: Diagnosis not present

## 2021-06-28 DIAGNOSIS — Z79899 Other long term (current) drug therapy: Secondary | ICD-10-CM | POA: Diagnosis not present

## 2021-06-28 DIAGNOSIS — N2581 Secondary hyperparathyroidism of renal origin: Secondary | ICD-10-CM | POA: Diagnosis not present

## 2021-06-29 DIAGNOSIS — Z992 Dependence on renal dialysis: Secondary | ICD-10-CM | POA: Diagnosis not present

## 2021-06-29 DIAGNOSIS — N2581 Secondary hyperparathyroidism of renal origin: Secondary | ICD-10-CM | POA: Diagnosis not present

## 2021-06-29 DIAGNOSIS — D509 Iron deficiency anemia, unspecified: Secondary | ICD-10-CM | POA: Diagnosis not present

## 2021-06-29 DIAGNOSIS — D631 Anemia in chronic kidney disease: Secondary | ICD-10-CM | POA: Diagnosis not present

## 2021-06-29 DIAGNOSIS — Z79899 Other long term (current) drug therapy: Secondary | ICD-10-CM | POA: Diagnosis not present

## 2021-06-29 DIAGNOSIS — N186 End stage renal disease: Secondary | ICD-10-CM | POA: Diagnosis not present

## 2021-06-29 DIAGNOSIS — N2589 Other disorders resulting from impaired renal tubular function: Secondary | ICD-10-CM | POA: Diagnosis not present

## 2021-06-29 DIAGNOSIS — R17 Unspecified jaundice: Secondary | ICD-10-CM | POA: Diagnosis not present

## 2021-06-29 DIAGNOSIS — E440 Moderate protein-calorie malnutrition: Secondary | ICD-10-CM | POA: Diagnosis not present

## 2021-06-30 DIAGNOSIS — Z79899 Other long term (current) drug therapy: Secondary | ICD-10-CM | POA: Diagnosis not present

## 2021-06-30 DIAGNOSIS — E440 Moderate protein-calorie malnutrition: Secondary | ICD-10-CM | POA: Diagnosis not present

## 2021-06-30 DIAGNOSIS — R17 Unspecified jaundice: Secondary | ICD-10-CM | POA: Diagnosis not present

## 2021-06-30 DIAGNOSIS — N186 End stage renal disease: Secondary | ICD-10-CM | POA: Diagnosis not present

## 2021-06-30 DIAGNOSIS — Z992 Dependence on renal dialysis: Secondary | ICD-10-CM | POA: Diagnosis not present

## 2021-06-30 DIAGNOSIS — D509 Iron deficiency anemia, unspecified: Secondary | ICD-10-CM | POA: Diagnosis not present

## 2021-06-30 DIAGNOSIS — N2581 Secondary hyperparathyroidism of renal origin: Secondary | ICD-10-CM | POA: Diagnosis not present

## 2021-06-30 DIAGNOSIS — N2589 Other disorders resulting from impaired renal tubular function: Secondary | ICD-10-CM | POA: Diagnosis not present

## 2021-06-30 DIAGNOSIS — D631 Anemia in chronic kidney disease: Secondary | ICD-10-CM | POA: Diagnosis not present

## 2021-07-01 DIAGNOSIS — R17 Unspecified jaundice: Secondary | ICD-10-CM | POA: Diagnosis not present

## 2021-07-01 DIAGNOSIS — N2589 Other disorders resulting from impaired renal tubular function: Secondary | ICD-10-CM | POA: Diagnosis not present

## 2021-07-01 DIAGNOSIS — D631 Anemia in chronic kidney disease: Secondary | ICD-10-CM | POA: Diagnosis not present

## 2021-07-01 DIAGNOSIS — N2581 Secondary hyperparathyroidism of renal origin: Secondary | ICD-10-CM | POA: Diagnosis not present

## 2021-07-01 DIAGNOSIS — N186 End stage renal disease: Secondary | ICD-10-CM | POA: Diagnosis not present

## 2021-07-01 DIAGNOSIS — Z79899 Other long term (current) drug therapy: Secondary | ICD-10-CM | POA: Diagnosis not present

## 2021-07-01 DIAGNOSIS — D509 Iron deficiency anemia, unspecified: Secondary | ICD-10-CM | POA: Diagnosis not present

## 2021-07-01 DIAGNOSIS — E440 Moderate protein-calorie malnutrition: Secondary | ICD-10-CM | POA: Diagnosis not present

## 2021-07-01 DIAGNOSIS — Z992 Dependence on renal dialysis: Secondary | ICD-10-CM | POA: Diagnosis not present

## 2021-07-02 DIAGNOSIS — Z992 Dependence on renal dialysis: Secondary | ICD-10-CM | POA: Diagnosis not present

## 2021-07-02 DIAGNOSIS — N2589 Other disorders resulting from impaired renal tubular function: Secondary | ICD-10-CM | POA: Diagnosis not present

## 2021-07-02 DIAGNOSIS — D509 Iron deficiency anemia, unspecified: Secondary | ICD-10-CM | POA: Diagnosis not present

## 2021-07-02 DIAGNOSIS — Z79899 Other long term (current) drug therapy: Secondary | ICD-10-CM | POA: Diagnosis not present

## 2021-07-02 DIAGNOSIS — D631 Anemia in chronic kidney disease: Secondary | ICD-10-CM | POA: Diagnosis not present

## 2021-07-02 DIAGNOSIS — N2581 Secondary hyperparathyroidism of renal origin: Secondary | ICD-10-CM | POA: Diagnosis not present

## 2021-07-02 DIAGNOSIS — R17 Unspecified jaundice: Secondary | ICD-10-CM | POA: Diagnosis not present

## 2021-07-02 DIAGNOSIS — N186 End stage renal disease: Secondary | ICD-10-CM | POA: Diagnosis not present

## 2021-07-02 DIAGNOSIS — E440 Moderate protein-calorie malnutrition: Secondary | ICD-10-CM | POA: Diagnosis not present

## 2021-07-03 DIAGNOSIS — N2589 Other disorders resulting from impaired renal tubular function: Secondary | ICD-10-CM | POA: Diagnosis not present

## 2021-07-03 DIAGNOSIS — Z79899 Other long term (current) drug therapy: Secondary | ICD-10-CM | POA: Diagnosis not present

## 2021-07-03 DIAGNOSIS — D631 Anemia in chronic kidney disease: Secondary | ICD-10-CM | POA: Diagnosis not present

## 2021-07-03 DIAGNOSIS — E440 Moderate protein-calorie malnutrition: Secondary | ICD-10-CM | POA: Diagnosis not present

## 2021-07-03 DIAGNOSIS — N2581 Secondary hyperparathyroidism of renal origin: Secondary | ICD-10-CM | POA: Diagnosis not present

## 2021-07-03 DIAGNOSIS — D509 Iron deficiency anemia, unspecified: Secondary | ICD-10-CM | POA: Diagnosis not present

## 2021-07-03 DIAGNOSIS — N186 End stage renal disease: Secondary | ICD-10-CM | POA: Diagnosis not present

## 2021-07-03 DIAGNOSIS — R17 Unspecified jaundice: Secondary | ICD-10-CM | POA: Diagnosis not present

## 2021-07-03 DIAGNOSIS — Z992 Dependence on renal dialysis: Secondary | ICD-10-CM | POA: Diagnosis not present

## 2021-07-04 DIAGNOSIS — N2581 Secondary hyperparathyroidism of renal origin: Secondary | ICD-10-CM | POA: Diagnosis not present

## 2021-07-04 DIAGNOSIS — R17 Unspecified jaundice: Secondary | ICD-10-CM | POA: Diagnosis not present

## 2021-07-04 DIAGNOSIS — E440 Moderate protein-calorie malnutrition: Secondary | ICD-10-CM | POA: Diagnosis not present

## 2021-07-04 DIAGNOSIS — Z79899 Other long term (current) drug therapy: Secondary | ICD-10-CM | POA: Diagnosis not present

## 2021-07-04 DIAGNOSIS — D509 Iron deficiency anemia, unspecified: Secondary | ICD-10-CM | POA: Diagnosis not present

## 2021-07-04 DIAGNOSIS — D631 Anemia in chronic kidney disease: Secondary | ICD-10-CM | POA: Diagnosis not present

## 2021-07-04 DIAGNOSIS — Z992 Dependence on renal dialysis: Secondary | ICD-10-CM | POA: Diagnosis not present

## 2021-07-04 DIAGNOSIS — N2589 Other disorders resulting from impaired renal tubular function: Secondary | ICD-10-CM | POA: Diagnosis not present

## 2021-07-04 DIAGNOSIS — N186 End stage renal disease: Secondary | ICD-10-CM | POA: Diagnosis not present

## 2021-07-05 DIAGNOSIS — D631 Anemia in chronic kidney disease: Secondary | ICD-10-CM | POA: Diagnosis not present

## 2021-07-05 DIAGNOSIS — N186 End stage renal disease: Secondary | ICD-10-CM | POA: Diagnosis not present

## 2021-07-05 DIAGNOSIS — Z79899 Other long term (current) drug therapy: Secondary | ICD-10-CM | POA: Diagnosis not present

## 2021-07-05 DIAGNOSIS — N2581 Secondary hyperparathyroidism of renal origin: Secondary | ICD-10-CM | POA: Diagnosis not present

## 2021-07-05 DIAGNOSIS — Z992 Dependence on renal dialysis: Secondary | ICD-10-CM | POA: Diagnosis not present

## 2021-07-05 DIAGNOSIS — N2589 Other disorders resulting from impaired renal tubular function: Secondary | ICD-10-CM | POA: Diagnosis not present

## 2021-07-05 DIAGNOSIS — R17 Unspecified jaundice: Secondary | ICD-10-CM | POA: Diagnosis not present

## 2021-07-05 DIAGNOSIS — D509 Iron deficiency anemia, unspecified: Secondary | ICD-10-CM | POA: Diagnosis not present

## 2021-07-05 DIAGNOSIS — E440 Moderate protein-calorie malnutrition: Secondary | ICD-10-CM | POA: Diagnosis not present

## 2021-07-06 DIAGNOSIS — Z79899 Other long term (current) drug therapy: Secondary | ICD-10-CM | POA: Diagnosis not present

## 2021-07-06 DIAGNOSIS — Z992 Dependence on renal dialysis: Secondary | ICD-10-CM | POA: Diagnosis not present

## 2021-07-06 DIAGNOSIS — E440 Moderate protein-calorie malnutrition: Secondary | ICD-10-CM | POA: Diagnosis not present

## 2021-07-06 DIAGNOSIS — D509 Iron deficiency anemia, unspecified: Secondary | ICD-10-CM | POA: Diagnosis not present

## 2021-07-06 DIAGNOSIS — N2589 Other disorders resulting from impaired renal tubular function: Secondary | ICD-10-CM | POA: Diagnosis not present

## 2021-07-06 DIAGNOSIS — D631 Anemia in chronic kidney disease: Secondary | ICD-10-CM | POA: Diagnosis not present

## 2021-07-06 DIAGNOSIS — N2581 Secondary hyperparathyroidism of renal origin: Secondary | ICD-10-CM | POA: Diagnosis not present

## 2021-07-06 DIAGNOSIS — N186 End stage renal disease: Secondary | ICD-10-CM | POA: Diagnosis not present

## 2021-07-06 DIAGNOSIS — R17 Unspecified jaundice: Secondary | ICD-10-CM | POA: Diagnosis not present

## 2021-07-07 DIAGNOSIS — N2589 Other disorders resulting from impaired renal tubular function: Secondary | ICD-10-CM | POA: Diagnosis not present

## 2021-07-07 DIAGNOSIS — N186 End stage renal disease: Secondary | ICD-10-CM | POA: Diagnosis not present

## 2021-07-07 DIAGNOSIS — N2581 Secondary hyperparathyroidism of renal origin: Secondary | ICD-10-CM | POA: Diagnosis not present

## 2021-07-07 DIAGNOSIS — D631 Anemia in chronic kidney disease: Secondary | ICD-10-CM | POA: Diagnosis not present

## 2021-07-07 DIAGNOSIS — D509 Iron deficiency anemia, unspecified: Secondary | ICD-10-CM | POA: Diagnosis not present

## 2021-07-07 DIAGNOSIS — E440 Moderate protein-calorie malnutrition: Secondary | ICD-10-CM | POA: Diagnosis not present

## 2021-07-07 DIAGNOSIS — R17 Unspecified jaundice: Secondary | ICD-10-CM | POA: Diagnosis not present

## 2021-07-07 DIAGNOSIS — Z79899 Other long term (current) drug therapy: Secondary | ICD-10-CM | POA: Diagnosis not present

## 2021-07-07 DIAGNOSIS — Z992 Dependence on renal dialysis: Secondary | ICD-10-CM | POA: Diagnosis not present

## 2021-07-08 DIAGNOSIS — D509 Iron deficiency anemia, unspecified: Secondary | ICD-10-CM | POA: Diagnosis not present

## 2021-07-08 DIAGNOSIS — R17 Unspecified jaundice: Secondary | ICD-10-CM | POA: Diagnosis not present

## 2021-07-08 DIAGNOSIS — E1122 Type 2 diabetes mellitus with diabetic chronic kidney disease: Secondary | ICD-10-CM | POA: Diagnosis not present

## 2021-07-08 DIAGNOSIS — E440 Moderate protein-calorie malnutrition: Secondary | ICD-10-CM | POA: Diagnosis not present

## 2021-07-08 DIAGNOSIS — Z79899 Other long term (current) drug therapy: Secondary | ICD-10-CM | POA: Diagnosis not present

## 2021-07-08 DIAGNOSIS — D631 Anemia in chronic kidney disease: Secondary | ICD-10-CM | POA: Diagnosis not present

## 2021-07-08 DIAGNOSIS — N2581 Secondary hyperparathyroidism of renal origin: Secondary | ICD-10-CM | POA: Diagnosis not present

## 2021-07-08 DIAGNOSIS — N2589 Other disorders resulting from impaired renal tubular function: Secondary | ICD-10-CM | POA: Diagnosis not present

## 2021-07-08 DIAGNOSIS — N186 End stage renal disease: Secondary | ICD-10-CM | POA: Diagnosis not present

## 2021-07-08 DIAGNOSIS — Z992 Dependence on renal dialysis: Secondary | ICD-10-CM | POA: Diagnosis not present

## 2021-07-09 DIAGNOSIS — D509 Iron deficiency anemia, unspecified: Secondary | ICD-10-CM | POA: Diagnosis not present

## 2021-07-09 DIAGNOSIS — N2581 Secondary hyperparathyroidism of renal origin: Secondary | ICD-10-CM | POA: Diagnosis not present

## 2021-07-09 DIAGNOSIS — E1122 Type 2 diabetes mellitus with diabetic chronic kidney disease: Secondary | ICD-10-CM | POA: Diagnosis not present

## 2021-07-09 DIAGNOSIS — N186 End stage renal disease: Secondary | ICD-10-CM | POA: Diagnosis not present

## 2021-07-09 DIAGNOSIS — N2589 Other disorders resulting from impaired renal tubular function: Secondary | ICD-10-CM | POA: Diagnosis not present

## 2021-07-09 DIAGNOSIS — Z992 Dependence on renal dialysis: Secondary | ICD-10-CM | POA: Diagnosis not present

## 2021-07-09 DIAGNOSIS — D631 Anemia in chronic kidney disease: Secondary | ICD-10-CM | POA: Diagnosis not present

## 2021-07-10 DIAGNOSIS — N186 End stage renal disease: Secondary | ICD-10-CM | POA: Diagnosis not present

## 2021-07-10 DIAGNOSIS — D631 Anemia in chronic kidney disease: Secondary | ICD-10-CM | POA: Diagnosis not present

## 2021-07-10 DIAGNOSIS — Z992 Dependence on renal dialysis: Secondary | ICD-10-CM | POA: Diagnosis not present

## 2021-07-10 DIAGNOSIS — E1122 Type 2 diabetes mellitus with diabetic chronic kidney disease: Secondary | ICD-10-CM | POA: Diagnosis not present

## 2021-07-10 DIAGNOSIS — D509 Iron deficiency anemia, unspecified: Secondary | ICD-10-CM | POA: Diagnosis not present

## 2021-07-10 DIAGNOSIS — N2581 Secondary hyperparathyroidism of renal origin: Secondary | ICD-10-CM | POA: Diagnosis not present

## 2021-07-10 DIAGNOSIS — N2589 Other disorders resulting from impaired renal tubular function: Secondary | ICD-10-CM | POA: Diagnosis not present

## 2021-07-11 DIAGNOSIS — N186 End stage renal disease: Secondary | ICD-10-CM | POA: Diagnosis not present

## 2021-07-11 DIAGNOSIS — N2581 Secondary hyperparathyroidism of renal origin: Secondary | ICD-10-CM | POA: Diagnosis not present

## 2021-07-11 DIAGNOSIS — D631 Anemia in chronic kidney disease: Secondary | ICD-10-CM | POA: Diagnosis not present

## 2021-07-11 DIAGNOSIS — Z992 Dependence on renal dialysis: Secondary | ICD-10-CM | POA: Diagnosis not present

## 2021-07-11 DIAGNOSIS — D509 Iron deficiency anemia, unspecified: Secondary | ICD-10-CM | POA: Diagnosis not present

## 2021-07-11 DIAGNOSIS — E1122 Type 2 diabetes mellitus with diabetic chronic kidney disease: Secondary | ICD-10-CM | POA: Diagnosis not present

## 2021-07-11 DIAGNOSIS — N2589 Other disorders resulting from impaired renal tubular function: Secondary | ICD-10-CM | POA: Diagnosis not present

## 2021-07-12 DIAGNOSIS — D631 Anemia in chronic kidney disease: Secondary | ICD-10-CM | POA: Diagnosis not present

## 2021-07-12 DIAGNOSIS — N2589 Other disorders resulting from impaired renal tubular function: Secondary | ICD-10-CM | POA: Diagnosis not present

## 2021-07-12 DIAGNOSIS — N186 End stage renal disease: Secondary | ICD-10-CM | POA: Diagnosis not present

## 2021-07-12 DIAGNOSIS — Z992 Dependence on renal dialysis: Secondary | ICD-10-CM | POA: Diagnosis not present

## 2021-07-12 DIAGNOSIS — N2581 Secondary hyperparathyroidism of renal origin: Secondary | ICD-10-CM | POA: Diagnosis not present

## 2021-07-12 DIAGNOSIS — E1122 Type 2 diabetes mellitus with diabetic chronic kidney disease: Secondary | ICD-10-CM | POA: Diagnosis not present

## 2021-07-12 DIAGNOSIS — D509 Iron deficiency anemia, unspecified: Secondary | ICD-10-CM | POA: Diagnosis not present

## 2021-07-13 DIAGNOSIS — D509 Iron deficiency anemia, unspecified: Secondary | ICD-10-CM | POA: Diagnosis not present

## 2021-07-13 DIAGNOSIS — N186 End stage renal disease: Secondary | ICD-10-CM | POA: Diagnosis not present

## 2021-07-13 DIAGNOSIS — N2581 Secondary hyperparathyroidism of renal origin: Secondary | ICD-10-CM | POA: Diagnosis not present

## 2021-07-13 DIAGNOSIS — Z992 Dependence on renal dialysis: Secondary | ICD-10-CM | POA: Diagnosis not present

## 2021-07-13 DIAGNOSIS — E1122 Type 2 diabetes mellitus with diabetic chronic kidney disease: Secondary | ICD-10-CM | POA: Diagnosis not present

## 2021-07-13 DIAGNOSIS — N2589 Other disorders resulting from impaired renal tubular function: Secondary | ICD-10-CM | POA: Diagnosis not present

## 2021-07-13 DIAGNOSIS — D631 Anemia in chronic kidney disease: Secondary | ICD-10-CM | POA: Diagnosis not present

## 2021-07-14 DIAGNOSIS — N2589 Other disorders resulting from impaired renal tubular function: Secondary | ICD-10-CM | POA: Diagnosis not present

## 2021-07-14 DIAGNOSIS — N2581 Secondary hyperparathyroidism of renal origin: Secondary | ICD-10-CM | POA: Diagnosis not present

## 2021-07-14 DIAGNOSIS — Z992 Dependence on renal dialysis: Secondary | ICD-10-CM | POA: Diagnosis not present

## 2021-07-14 DIAGNOSIS — E1122 Type 2 diabetes mellitus with diabetic chronic kidney disease: Secondary | ICD-10-CM | POA: Diagnosis not present

## 2021-07-14 DIAGNOSIS — N186 End stage renal disease: Secondary | ICD-10-CM | POA: Diagnosis not present

## 2021-07-14 DIAGNOSIS — D509 Iron deficiency anemia, unspecified: Secondary | ICD-10-CM | POA: Diagnosis not present

## 2021-07-14 DIAGNOSIS — D631 Anemia in chronic kidney disease: Secondary | ICD-10-CM | POA: Diagnosis not present

## 2021-07-15 DIAGNOSIS — Z992 Dependence on renal dialysis: Secondary | ICD-10-CM | POA: Diagnosis not present

## 2021-07-15 DIAGNOSIS — N186 End stage renal disease: Secondary | ICD-10-CM | POA: Diagnosis not present

## 2021-07-15 DIAGNOSIS — E1122 Type 2 diabetes mellitus with diabetic chronic kidney disease: Secondary | ICD-10-CM | POA: Diagnosis not present

## 2021-07-15 DIAGNOSIS — N2589 Other disorders resulting from impaired renal tubular function: Secondary | ICD-10-CM | POA: Diagnosis not present

## 2021-07-15 DIAGNOSIS — D509 Iron deficiency anemia, unspecified: Secondary | ICD-10-CM | POA: Diagnosis not present

## 2021-07-15 DIAGNOSIS — D631 Anemia in chronic kidney disease: Secondary | ICD-10-CM | POA: Diagnosis not present

## 2021-07-15 DIAGNOSIS — N2581 Secondary hyperparathyroidism of renal origin: Secondary | ICD-10-CM | POA: Diagnosis not present

## 2021-07-16 DIAGNOSIS — N186 End stage renal disease: Secondary | ICD-10-CM | POA: Diagnosis not present

## 2021-07-16 DIAGNOSIS — E11621 Type 2 diabetes mellitus with foot ulcer: Secondary | ICD-10-CM | POA: Diagnosis not present

## 2021-07-16 DIAGNOSIS — Z992 Dependence on renal dialysis: Secondary | ICD-10-CM | POA: Diagnosis not present

## 2021-07-16 DIAGNOSIS — N2581 Secondary hyperparathyroidism of renal origin: Secondary | ICD-10-CM | POA: Diagnosis not present

## 2021-07-16 DIAGNOSIS — L97522 Non-pressure chronic ulcer of other part of left foot with fat layer exposed: Secondary | ICD-10-CM | POA: Diagnosis not present

## 2021-07-16 DIAGNOSIS — D631 Anemia in chronic kidney disease: Secondary | ICD-10-CM | POA: Diagnosis not present

## 2021-07-16 DIAGNOSIS — L97512 Non-pressure chronic ulcer of other part of right foot with fat layer exposed: Secondary | ICD-10-CM | POA: Diagnosis not present

## 2021-07-16 DIAGNOSIS — Z794 Long term (current) use of insulin: Secondary | ICD-10-CM | POA: Diagnosis not present

## 2021-07-16 DIAGNOSIS — E1122 Type 2 diabetes mellitus with diabetic chronic kidney disease: Secondary | ICD-10-CM | POA: Diagnosis not present

## 2021-07-16 DIAGNOSIS — N2589 Other disorders resulting from impaired renal tubular function: Secondary | ICD-10-CM | POA: Diagnosis not present

## 2021-07-16 DIAGNOSIS — B351 Tinea unguium: Secondary | ICD-10-CM | POA: Diagnosis not present

## 2021-07-16 DIAGNOSIS — Z89422 Acquired absence of other left toe(s): Secondary | ICD-10-CM | POA: Diagnosis not present

## 2021-07-16 DIAGNOSIS — D509 Iron deficiency anemia, unspecified: Secondary | ICD-10-CM | POA: Diagnosis not present

## 2021-07-17 DIAGNOSIS — E1122 Type 2 diabetes mellitus with diabetic chronic kidney disease: Secondary | ICD-10-CM | POA: Diagnosis not present

## 2021-07-17 DIAGNOSIS — N186 End stage renal disease: Secondary | ICD-10-CM | POA: Diagnosis not present

## 2021-07-17 DIAGNOSIS — Z992 Dependence on renal dialysis: Secondary | ICD-10-CM | POA: Diagnosis not present

## 2021-07-17 DIAGNOSIS — N2581 Secondary hyperparathyroidism of renal origin: Secondary | ICD-10-CM | POA: Diagnosis not present

## 2021-07-17 DIAGNOSIS — D509 Iron deficiency anemia, unspecified: Secondary | ICD-10-CM | POA: Diagnosis not present

## 2021-07-17 DIAGNOSIS — D631 Anemia in chronic kidney disease: Secondary | ICD-10-CM | POA: Diagnosis not present

## 2021-07-17 DIAGNOSIS — N2589 Other disorders resulting from impaired renal tubular function: Secondary | ICD-10-CM | POA: Diagnosis not present

## 2021-07-18 DIAGNOSIS — D509 Iron deficiency anemia, unspecified: Secondary | ICD-10-CM | POA: Diagnosis not present

## 2021-07-18 DIAGNOSIS — Z992 Dependence on renal dialysis: Secondary | ICD-10-CM | POA: Diagnosis not present

## 2021-07-18 DIAGNOSIS — N2589 Other disorders resulting from impaired renal tubular function: Secondary | ICD-10-CM | POA: Diagnosis not present

## 2021-07-18 DIAGNOSIS — N2581 Secondary hyperparathyroidism of renal origin: Secondary | ICD-10-CM | POA: Diagnosis not present

## 2021-07-18 DIAGNOSIS — N186 End stage renal disease: Secondary | ICD-10-CM | POA: Diagnosis not present

## 2021-07-18 DIAGNOSIS — D631 Anemia in chronic kidney disease: Secondary | ICD-10-CM | POA: Diagnosis not present

## 2021-07-18 DIAGNOSIS — E1122 Type 2 diabetes mellitus with diabetic chronic kidney disease: Secondary | ICD-10-CM | POA: Diagnosis not present

## 2021-07-19 DIAGNOSIS — D509 Iron deficiency anemia, unspecified: Secondary | ICD-10-CM | POA: Diagnosis not present

## 2021-07-19 DIAGNOSIS — N186 End stage renal disease: Secondary | ICD-10-CM | POA: Diagnosis not present

## 2021-07-19 DIAGNOSIS — D631 Anemia in chronic kidney disease: Secondary | ICD-10-CM | POA: Diagnosis not present

## 2021-07-19 DIAGNOSIS — N2581 Secondary hyperparathyroidism of renal origin: Secondary | ICD-10-CM | POA: Diagnosis not present

## 2021-07-19 DIAGNOSIS — N2589 Other disorders resulting from impaired renal tubular function: Secondary | ICD-10-CM | POA: Diagnosis not present

## 2021-07-19 DIAGNOSIS — Z992 Dependence on renal dialysis: Secondary | ICD-10-CM | POA: Diagnosis not present

## 2021-07-19 DIAGNOSIS — E1122 Type 2 diabetes mellitus with diabetic chronic kidney disease: Secondary | ICD-10-CM | POA: Diagnosis not present

## 2021-07-20 DIAGNOSIS — Z992 Dependence on renal dialysis: Secondary | ICD-10-CM | POA: Diagnosis not present

## 2021-07-20 DIAGNOSIS — D631 Anemia in chronic kidney disease: Secondary | ICD-10-CM | POA: Diagnosis not present

## 2021-07-20 DIAGNOSIS — D509 Iron deficiency anemia, unspecified: Secondary | ICD-10-CM | POA: Diagnosis not present

## 2021-07-20 DIAGNOSIS — N186 End stage renal disease: Secondary | ICD-10-CM | POA: Diagnosis not present

## 2021-07-20 DIAGNOSIS — N2589 Other disorders resulting from impaired renal tubular function: Secondary | ICD-10-CM | POA: Diagnosis not present

## 2021-07-20 DIAGNOSIS — N2581 Secondary hyperparathyroidism of renal origin: Secondary | ICD-10-CM | POA: Diagnosis not present

## 2021-07-20 DIAGNOSIS — E1122 Type 2 diabetes mellitus with diabetic chronic kidney disease: Secondary | ICD-10-CM | POA: Diagnosis not present

## 2021-07-21 DIAGNOSIS — N186 End stage renal disease: Secondary | ICD-10-CM | POA: Diagnosis not present

## 2021-07-21 DIAGNOSIS — H25812 Combined forms of age-related cataract, left eye: Secondary | ICD-10-CM | POA: Diagnosis not present

## 2021-07-21 DIAGNOSIS — H35371 Puckering of macula, right eye: Secondary | ICD-10-CM | POA: Diagnosis not present

## 2021-07-21 DIAGNOSIS — N2589 Other disorders resulting from impaired renal tubular function: Secondary | ICD-10-CM | POA: Diagnosis not present

## 2021-07-21 DIAGNOSIS — N2581 Secondary hyperparathyroidism of renal origin: Secondary | ICD-10-CM | POA: Diagnosis not present

## 2021-07-21 DIAGNOSIS — D509 Iron deficiency anemia, unspecified: Secondary | ICD-10-CM | POA: Diagnosis not present

## 2021-07-21 DIAGNOSIS — H401132 Primary open-angle glaucoma, bilateral, moderate stage: Secondary | ICD-10-CM | POA: Diagnosis not present

## 2021-07-21 DIAGNOSIS — D631 Anemia in chronic kidney disease: Secondary | ICD-10-CM | POA: Diagnosis not present

## 2021-07-21 DIAGNOSIS — H25811 Combined forms of age-related cataract, right eye: Secondary | ICD-10-CM | POA: Diagnosis not present

## 2021-07-21 DIAGNOSIS — Z01818 Encounter for other preprocedural examination: Secondary | ICD-10-CM | POA: Diagnosis not present

## 2021-07-21 DIAGNOSIS — E1122 Type 2 diabetes mellitus with diabetic chronic kidney disease: Secondary | ICD-10-CM | POA: Diagnosis not present

## 2021-07-21 DIAGNOSIS — Z992 Dependence on renal dialysis: Secondary | ICD-10-CM | POA: Diagnosis not present

## 2021-07-22 DIAGNOSIS — D509 Iron deficiency anemia, unspecified: Secondary | ICD-10-CM | POA: Diagnosis not present

## 2021-07-22 DIAGNOSIS — E1122 Type 2 diabetes mellitus with diabetic chronic kidney disease: Secondary | ICD-10-CM | POA: Diagnosis not present

## 2021-07-22 DIAGNOSIS — Z992 Dependence on renal dialysis: Secondary | ICD-10-CM | POA: Diagnosis not present

## 2021-07-22 DIAGNOSIS — N186 End stage renal disease: Secondary | ICD-10-CM | POA: Diagnosis not present

## 2021-07-22 DIAGNOSIS — N2589 Other disorders resulting from impaired renal tubular function: Secondary | ICD-10-CM | POA: Diagnosis not present

## 2021-07-22 DIAGNOSIS — N2581 Secondary hyperparathyroidism of renal origin: Secondary | ICD-10-CM | POA: Diagnosis not present

## 2021-07-22 DIAGNOSIS — D631 Anemia in chronic kidney disease: Secondary | ICD-10-CM | POA: Diagnosis not present

## 2021-07-23 DIAGNOSIS — E1122 Type 2 diabetes mellitus with diabetic chronic kidney disease: Secondary | ICD-10-CM | POA: Diagnosis not present

## 2021-07-23 DIAGNOSIS — N186 End stage renal disease: Secondary | ICD-10-CM | POA: Diagnosis not present

## 2021-07-23 DIAGNOSIS — N2581 Secondary hyperparathyroidism of renal origin: Secondary | ICD-10-CM | POA: Diagnosis not present

## 2021-07-23 DIAGNOSIS — D631 Anemia in chronic kidney disease: Secondary | ICD-10-CM | POA: Diagnosis not present

## 2021-07-23 DIAGNOSIS — Z992 Dependence on renal dialysis: Secondary | ICD-10-CM | POA: Diagnosis not present

## 2021-07-23 DIAGNOSIS — D509 Iron deficiency anemia, unspecified: Secondary | ICD-10-CM | POA: Diagnosis not present

## 2021-07-23 DIAGNOSIS — N2589 Other disorders resulting from impaired renal tubular function: Secondary | ICD-10-CM | POA: Diagnosis not present

## 2021-07-24 DIAGNOSIS — D631 Anemia in chronic kidney disease: Secondary | ICD-10-CM | POA: Diagnosis not present

## 2021-07-24 DIAGNOSIS — N2581 Secondary hyperparathyroidism of renal origin: Secondary | ICD-10-CM | POA: Diagnosis not present

## 2021-07-24 DIAGNOSIS — N186 End stage renal disease: Secondary | ICD-10-CM | POA: Diagnosis not present

## 2021-07-24 DIAGNOSIS — E1122 Type 2 diabetes mellitus with diabetic chronic kidney disease: Secondary | ICD-10-CM | POA: Diagnosis not present

## 2021-07-24 DIAGNOSIS — D509 Iron deficiency anemia, unspecified: Secondary | ICD-10-CM | POA: Diagnosis not present

## 2021-07-24 DIAGNOSIS — N2589 Other disorders resulting from impaired renal tubular function: Secondary | ICD-10-CM | POA: Diagnosis not present

## 2021-07-24 DIAGNOSIS — Z992 Dependence on renal dialysis: Secondary | ICD-10-CM | POA: Diagnosis not present

## 2021-07-25 DIAGNOSIS — Z992 Dependence on renal dialysis: Secondary | ICD-10-CM | POA: Diagnosis not present

## 2021-07-25 DIAGNOSIS — N2581 Secondary hyperparathyroidism of renal origin: Secondary | ICD-10-CM | POA: Diagnosis not present

## 2021-07-25 DIAGNOSIS — D509 Iron deficiency anemia, unspecified: Secondary | ICD-10-CM | POA: Diagnosis not present

## 2021-07-25 DIAGNOSIS — D631 Anemia in chronic kidney disease: Secondary | ICD-10-CM | POA: Diagnosis not present

## 2021-07-25 DIAGNOSIS — E1122 Type 2 diabetes mellitus with diabetic chronic kidney disease: Secondary | ICD-10-CM | POA: Diagnosis not present

## 2021-07-25 DIAGNOSIS — N2589 Other disorders resulting from impaired renal tubular function: Secondary | ICD-10-CM | POA: Diagnosis not present

## 2021-07-25 DIAGNOSIS — N186 End stage renal disease: Secondary | ICD-10-CM | POA: Diagnosis not present

## 2021-07-26 DIAGNOSIS — D509 Iron deficiency anemia, unspecified: Secondary | ICD-10-CM | POA: Diagnosis not present

## 2021-07-26 DIAGNOSIS — N2581 Secondary hyperparathyroidism of renal origin: Secondary | ICD-10-CM | POA: Diagnosis not present

## 2021-07-26 DIAGNOSIS — N186 End stage renal disease: Secondary | ICD-10-CM | POA: Diagnosis not present

## 2021-07-26 DIAGNOSIS — D631 Anemia in chronic kidney disease: Secondary | ICD-10-CM | POA: Diagnosis not present

## 2021-07-26 DIAGNOSIS — Z992 Dependence on renal dialysis: Secondary | ICD-10-CM | POA: Diagnosis not present

## 2021-07-26 DIAGNOSIS — N2589 Other disorders resulting from impaired renal tubular function: Secondary | ICD-10-CM | POA: Diagnosis not present

## 2021-07-26 DIAGNOSIS — E1122 Type 2 diabetes mellitus with diabetic chronic kidney disease: Secondary | ICD-10-CM | POA: Diagnosis not present

## 2021-07-27 DIAGNOSIS — N186 End stage renal disease: Secondary | ICD-10-CM | POA: Diagnosis not present

## 2021-07-27 DIAGNOSIS — N2589 Other disorders resulting from impaired renal tubular function: Secondary | ICD-10-CM | POA: Diagnosis not present

## 2021-07-27 DIAGNOSIS — D509 Iron deficiency anemia, unspecified: Secondary | ICD-10-CM | POA: Diagnosis not present

## 2021-07-27 DIAGNOSIS — Z992 Dependence on renal dialysis: Secondary | ICD-10-CM | POA: Diagnosis not present

## 2021-07-27 DIAGNOSIS — E1122 Type 2 diabetes mellitus with diabetic chronic kidney disease: Secondary | ICD-10-CM | POA: Diagnosis not present

## 2021-07-27 DIAGNOSIS — D631 Anemia in chronic kidney disease: Secondary | ICD-10-CM | POA: Diagnosis not present

## 2021-07-27 DIAGNOSIS — N2581 Secondary hyperparathyroidism of renal origin: Secondary | ICD-10-CM | POA: Diagnosis not present

## 2021-07-28 DIAGNOSIS — D509 Iron deficiency anemia, unspecified: Secondary | ICD-10-CM | POA: Diagnosis not present

## 2021-07-28 DIAGNOSIS — N186 End stage renal disease: Secondary | ICD-10-CM | POA: Diagnosis not present

## 2021-07-28 DIAGNOSIS — N2589 Other disorders resulting from impaired renal tubular function: Secondary | ICD-10-CM | POA: Diagnosis not present

## 2021-07-28 DIAGNOSIS — E1122 Type 2 diabetes mellitus with diabetic chronic kidney disease: Secondary | ICD-10-CM | POA: Diagnosis not present

## 2021-07-28 DIAGNOSIS — Z992 Dependence on renal dialysis: Secondary | ICD-10-CM | POA: Diagnosis not present

## 2021-07-28 DIAGNOSIS — D631 Anemia in chronic kidney disease: Secondary | ICD-10-CM | POA: Diagnosis not present

## 2021-07-28 DIAGNOSIS — N2581 Secondary hyperparathyroidism of renal origin: Secondary | ICD-10-CM | POA: Diagnosis not present

## 2021-07-29 DIAGNOSIS — D509 Iron deficiency anemia, unspecified: Secondary | ICD-10-CM | POA: Diagnosis not present

## 2021-07-29 DIAGNOSIS — N2581 Secondary hyperparathyroidism of renal origin: Secondary | ICD-10-CM | POA: Diagnosis not present

## 2021-07-29 DIAGNOSIS — E1122 Type 2 diabetes mellitus with diabetic chronic kidney disease: Secondary | ICD-10-CM | POA: Diagnosis not present

## 2021-07-29 DIAGNOSIS — N186 End stage renal disease: Secondary | ICD-10-CM | POA: Diagnosis not present

## 2021-07-29 DIAGNOSIS — Z992 Dependence on renal dialysis: Secondary | ICD-10-CM | POA: Diagnosis not present

## 2021-07-29 DIAGNOSIS — N2589 Other disorders resulting from impaired renal tubular function: Secondary | ICD-10-CM | POA: Diagnosis not present

## 2021-07-29 DIAGNOSIS — D631 Anemia in chronic kidney disease: Secondary | ICD-10-CM | POA: Diagnosis not present

## 2021-07-30 DIAGNOSIS — Z992 Dependence on renal dialysis: Secondary | ICD-10-CM | POA: Diagnosis not present

## 2021-07-30 DIAGNOSIS — D509 Iron deficiency anemia, unspecified: Secondary | ICD-10-CM | POA: Diagnosis not present

## 2021-07-30 DIAGNOSIS — N2589 Other disorders resulting from impaired renal tubular function: Secondary | ICD-10-CM | POA: Diagnosis not present

## 2021-07-30 DIAGNOSIS — D631 Anemia in chronic kidney disease: Secondary | ICD-10-CM | POA: Diagnosis not present

## 2021-07-30 DIAGNOSIS — N186 End stage renal disease: Secondary | ICD-10-CM | POA: Diagnosis not present

## 2021-07-30 DIAGNOSIS — N2581 Secondary hyperparathyroidism of renal origin: Secondary | ICD-10-CM | POA: Diagnosis not present

## 2021-07-30 DIAGNOSIS — E1122 Type 2 diabetes mellitus with diabetic chronic kidney disease: Secondary | ICD-10-CM | POA: Diagnosis not present

## 2021-07-31 DIAGNOSIS — H401132 Primary open-angle glaucoma, bilateral, moderate stage: Secondary | ICD-10-CM | POA: Diagnosis not present

## 2021-07-31 DIAGNOSIS — N186 End stage renal disease: Secondary | ICD-10-CM | POA: Diagnosis not present

## 2021-07-31 DIAGNOSIS — N2581 Secondary hyperparathyroidism of renal origin: Secondary | ICD-10-CM | POA: Diagnosis not present

## 2021-07-31 DIAGNOSIS — H25811 Combined forms of age-related cataract, right eye: Secondary | ICD-10-CM | POA: Diagnosis not present

## 2021-07-31 DIAGNOSIS — D509 Iron deficiency anemia, unspecified: Secondary | ICD-10-CM | POA: Diagnosis not present

## 2021-07-31 DIAGNOSIS — E1122 Type 2 diabetes mellitus with diabetic chronic kidney disease: Secondary | ICD-10-CM | POA: Diagnosis not present

## 2021-07-31 DIAGNOSIS — D631 Anemia in chronic kidney disease: Secondary | ICD-10-CM | POA: Diagnosis not present

## 2021-07-31 DIAGNOSIS — H401112 Primary open-angle glaucoma, right eye, moderate stage: Secondary | ICD-10-CM | POA: Diagnosis not present

## 2021-07-31 DIAGNOSIS — N2589 Other disorders resulting from impaired renal tubular function: Secondary | ICD-10-CM | POA: Diagnosis not present

## 2021-07-31 DIAGNOSIS — Z992 Dependence on renal dialysis: Secondary | ICD-10-CM | POA: Diagnosis not present

## 2021-08-01 DIAGNOSIS — D631 Anemia in chronic kidney disease: Secondary | ICD-10-CM | POA: Diagnosis not present

## 2021-08-01 DIAGNOSIS — N2589 Other disorders resulting from impaired renal tubular function: Secondary | ICD-10-CM | POA: Diagnosis not present

## 2021-08-01 DIAGNOSIS — E1122 Type 2 diabetes mellitus with diabetic chronic kidney disease: Secondary | ICD-10-CM | POA: Diagnosis not present

## 2021-08-01 DIAGNOSIS — Z992 Dependence on renal dialysis: Secondary | ICD-10-CM | POA: Diagnosis not present

## 2021-08-01 DIAGNOSIS — N2581 Secondary hyperparathyroidism of renal origin: Secondary | ICD-10-CM | POA: Diagnosis not present

## 2021-08-01 DIAGNOSIS — N186 End stage renal disease: Secondary | ICD-10-CM | POA: Diagnosis not present

## 2021-08-01 DIAGNOSIS — D509 Iron deficiency anemia, unspecified: Secondary | ICD-10-CM | POA: Diagnosis not present

## 2021-08-02 DIAGNOSIS — Z992 Dependence on renal dialysis: Secondary | ICD-10-CM | POA: Diagnosis not present

## 2021-08-02 DIAGNOSIS — N2581 Secondary hyperparathyroidism of renal origin: Secondary | ICD-10-CM | POA: Diagnosis not present

## 2021-08-02 DIAGNOSIS — N2589 Other disorders resulting from impaired renal tubular function: Secondary | ICD-10-CM | POA: Diagnosis not present

## 2021-08-02 DIAGNOSIS — N186 End stage renal disease: Secondary | ICD-10-CM | POA: Diagnosis not present

## 2021-08-02 DIAGNOSIS — D509 Iron deficiency anemia, unspecified: Secondary | ICD-10-CM | POA: Diagnosis not present

## 2021-08-02 DIAGNOSIS — E1122 Type 2 diabetes mellitus with diabetic chronic kidney disease: Secondary | ICD-10-CM | POA: Diagnosis not present

## 2021-08-02 DIAGNOSIS — D631 Anemia in chronic kidney disease: Secondary | ICD-10-CM | POA: Diagnosis not present

## 2021-08-03 DIAGNOSIS — E1122 Type 2 diabetes mellitus with diabetic chronic kidney disease: Secondary | ICD-10-CM | POA: Diagnosis not present

## 2021-08-03 DIAGNOSIS — Z992 Dependence on renal dialysis: Secondary | ICD-10-CM | POA: Diagnosis not present

## 2021-08-03 DIAGNOSIS — N186 End stage renal disease: Secondary | ICD-10-CM | POA: Diagnosis not present

## 2021-08-03 DIAGNOSIS — D631 Anemia in chronic kidney disease: Secondary | ICD-10-CM | POA: Diagnosis not present

## 2021-08-03 DIAGNOSIS — N2581 Secondary hyperparathyroidism of renal origin: Secondary | ICD-10-CM | POA: Diagnosis not present

## 2021-08-03 DIAGNOSIS — N2589 Other disorders resulting from impaired renal tubular function: Secondary | ICD-10-CM | POA: Diagnosis not present

## 2021-08-03 DIAGNOSIS — D509 Iron deficiency anemia, unspecified: Secondary | ICD-10-CM | POA: Diagnosis not present

## 2021-08-04 DIAGNOSIS — Z8619 Personal history of other infectious and parasitic diseases: Secondary | ICD-10-CM | POA: Diagnosis not present

## 2021-08-04 DIAGNOSIS — Z79899 Other long term (current) drug therapy: Secondary | ICD-10-CM | POA: Diagnosis not present

## 2021-08-04 DIAGNOSIS — N2581 Secondary hyperparathyroidism of renal origin: Secondary | ICD-10-CM | POA: Diagnosis not present

## 2021-08-04 DIAGNOSIS — N2589 Other disorders resulting from impaired renal tubular function: Secondary | ICD-10-CM | POA: Diagnosis not present

## 2021-08-04 DIAGNOSIS — N186 End stage renal disease: Secondary | ICD-10-CM | POA: Diagnosis not present

## 2021-08-04 DIAGNOSIS — I120 Hypertensive chronic kidney disease with stage 5 chronic kidney disease or end stage renal disease: Secondary | ICD-10-CM | POA: Diagnosis not present

## 2021-08-04 DIAGNOSIS — D631 Anemia in chronic kidney disease: Secondary | ICD-10-CM | POA: Diagnosis not present

## 2021-08-04 DIAGNOSIS — Z23 Encounter for immunization: Secondary | ICD-10-CM | POA: Diagnosis not present

## 2021-08-04 DIAGNOSIS — D509 Iron deficiency anemia, unspecified: Secondary | ICD-10-CM | POA: Diagnosis not present

## 2021-08-04 DIAGNOSIS — E1065 Type 1 diabetes mellitus with hyperglycemia: Secondary | ICD-10-CM | POA: Diagnosis not present

## 2021-08-04 DIAGNOSIS — E1122 Type 2 diabetes mellitus with diabetic chronic kidney disease: Secondary | ICD-10-CM | POA: Diagnosis not present

## 2021-08-04 DIAGNOSIS — Z992 Dependence on renal dialysis: Secondary | ICD-10-CM | POA: Diagnosis not present

## 2021-08-04 DIAGNOSIS — L97519 Non-pressure chronic ulcer of other part of right foot with unspecified severity: Secondary | ICD-10-CM | POA: Diagnosis not present

## 2021-08-04 DIAGNOSIS — E1022 Type 1 diabetes mellitus with diabetic chronic kidney disease: Secondary | ICD-10-CM | POA: Diagnosis not present

## 2021-08-05 DIAGNOSIS — R053 Chronic cough: Secondary | ICD-10-CM | POA: Diagnosis not present

## 2021-08-05 DIAGNOSIS — E1122 Type 2 diabetes mellitus with diabetic chronic kidney disease: Secondary | ICD-10-CM | POA: Diagnosis not present

## 2021-08-05 DIAGNOSIS — J301 Allergic rhinitis due to pollen: Secondary | ICD-10-CM | POA: Diagnosis not present

## 2021-08-05 DIAGNOSIS — N2589 Other disorders resulting from impaired renal tubular function: Secondary | ICD-10-CM | POA: Diagnosis not present

## 2021-08-05 DIAGNOSIS — D509 Iron deficiency anemia, unspecified: Secondary | ICD-10-CM | POA: Diagnosis not present

## 2021-08-05 DIAGNOSIS — J353 Hypertrophy of tonsils with hypertrophy of adenoids: Secondary | ICD-10-CM | POA: Diagnosis not present

## 2021-08-05 DIAGNOSIS — N2581 Secondary hyperparathyroidism of renal origin: Secondary | ICD-10-CM | POA: Diagnosis not present

## 2021-08-05 DIAGNOSIS — N186 End stage renal disease: Secondary | ICD-10-CM | POA: Diagnosis not present

## 2021-08-05 DIAGNOSIS — K219 Gastro-esophageal reflux disease without esophagitis: Secondary | ICD-10-CM | POA: Diagnosis not present

## 2021-08-05 DIAGNOSIS — Z992 Dependence on renal dialysis: Secondary | ICD-10-CM | POA: Diagnosis not present

## 2021-08-05 DIAGNOSIS — D631 Anemia in chronic kidney disease: Secondary | ICD-10-CM | POA: Diagnosis not present

## 2021-08-06 DIAGNOSIS — Z992 Dependence on renal dialysis: Secondary | ICD-10-CM | POA: Diagnosis not present

## 2021-08-06 DIAGNOSIS — D509 Iron deficiency anemia, unspecified: Secondary | ICD-10-CM | POA: Diagnosis not present

## 2021-08-06 DIAGNOSIS — E1122 Type 2 diabetes mellitus with diabetic chronic kidney disease: Secondary | ICD-10-CM | POA: Diagnosis not present

## 2021-08-06 DIAGNOSIS — N2589 Other disorders resulting from impaired renal tubular function: Secondary | ICD-10-CM | POA: Diagnosis not present

## 2021-08-06 DIAGNOSIS — N2581 Secondary hyperparathyroidism of renal origin: Secondary | ICD-10-CM | POA: Diagnosis not present

## 2021-08-06 DIAGNOSIS — N186 End stage renal disease: Secondary | ICD-10-CM | POA: Diagnosis not present

## 2021-08-06 DIAGNOSIS — D631 Anemia in chronic kidney disease: Secondary | ICD-10-CM | POA: Diagnosis not present

## 2021-08-07 DIAGNOSIS — N2581 Secondary hyperparathyroidism of renal origin: Secondary | ICD-10-CM | POA: Diagnosis not present

## 2021-08-07 DIAGNOSIS — Z992 Dependence on renal dialysis: Secondary | ICD-10-CM | POA: Diagnosis not present

## 2021-08-07 DIAGNOSIS — N2589 Other disorders resulting from impaired renal tubular function: Secondary | ICD-10-CM | POA: Diagnosis not present

## 2021-08-07 DIAGNOSIS — E1122 Type 2 diabetes mellitus with diabetic chronic kidney disease: Secondary | ICD-10-CM | POA: Diagnosis not present

## 2021-08-07 DIAGNOSIS — D631 Anemia in chronic kidney disease: Secondary | ICD-10-CM | POA: Diagnosis not present

## 2021-08-07 DIAGNOSIS — N186 End stage renal disease: Secondary | ICD-10-CM | POA: Diagnosis not present

## 2021-08-07 DIAGNOSIS — D509 Iron deficiency anemia, unspecified: Secondary | ICD-10-CM | POA: Diagnosis not present

## 2021-08-08 DIAGNOSIS — E440 Moderate protein-calorie malnutrition: Secondary | ICD-10-CM | POA: Diagnosis not present

## 2021-08-08 DIAGNOSIS — D631 Anemia in chronic kidney disease: Secondary | ICD-10-CM | POA: Diagnosis not present

## 2021-08-08 DIAGNOSIS — Z992 Dependence on renal dialysis: Secondary | ICD-10-CM | POA: Diagnosis not present

## 2021-08-08 DIAGNOSIS — N186 End stage renal disease: Secondary | ICD-10-CM | POA: Diagnosis not present

## 2021-08-08 DIAGNOSIS — N2589 Other disorders resulting from impaired renal tubular function: Secondary | ICD-10-CM | POA: Diagnosis not present

## 2021-08-08 DIAGNOSIS — D509 Iron deficiency anemia, unspecified: Secondary | ICD-10-CM | POA: Diagnosis not present

## 2021-08-08 DIAGNOSIS — R17 Unspecified jaundice: Secondary | ICD-10-CM | POA: Diagnosis not present

## 2021-08-08 DIAGNOSIS — N2581 Secondary hyperparathyroidism of renal origin: Secondary | ICD-10-CM | POA: Diagnosis not present

## 2021-08-08 DIAGNOSIS — Z79899 Other long term (current) drug therapy: Secondary | ICD-10-CM | POA: Diagnosis not present

## 2021-08-09 DIAGNOSIS — D631 Anemia in chronic kidney disease: Secondary | ICD-10-CM | POA: Diagnosis not present

## 2021-08-09 DIAGNOSIS — N2581 Secondary hyperparathyroidism of renal origin: Secondary | ICD-10-CM | POA: Diagnosis not present

## 2021-08-09 DIAGNOSIS — Z79899 Other long term (current) drug therapy: Secondary | ICD-10-CM | POA: Diagnosis not present

## 2021-08-09 DIAGNOSIS — D509 Iron deficiency anemia, unspecified: Secondary | ICD-10-CM | POA: Diagnosis not present

## 2021-08-09 DIAGNOSIS — N2589 Other disorders resulting from impaired renal tubular function: Secondary | ICD-10-CM | POA: Diagnosis not present

## 2021-08-09 DIAGNOSIS — Z992 Dependence on renal dialysis: Secondary | ICD-10-CM | POA: Diagnosis not present

## 2021-08-09 DIAGNOSIS — R17 Unspecified jaundice: Secondary | ICD-10-CM | POA: Diagnosis not present

## 2021-08-09 DIAGNOSIS — N186 End stage renal disease: Secondary | ICD-10-CM | POA: Diagnosis not present

## 2021-08-09 DIAGNOSIS — E440 Moderate protein-calorie malnutrition: Secondary | ICD-10-CM | POA: Diagnosis not present

## 2021-08-10 DIAGNOSIS — Z79899 Other long term (current) drug therapy: Secondary | ICD-10-CM | POA: Diagnosis not present

## 2021-08-10 DIAGNOSIS — Z794 Long term (current) use of insulin: Secondary | ICD-10-CM | POA: Diagnosis not present

## 2021-08-10 DIAGNOSIS — D509 Iron deficiency anemia, unspecified: Secondary | ICD-10-CM | POA: Diagnosis not present

## 2021-08-10 DIAGNOSIS — L97522 Non-pressure chronic ulcer of other part of left foot with fat layer exposed: Secondary | ICD-10-CM | POA: Diagnosis not present

## 2021-08-10 DIAGNOSIS — L97509 Non-pressure chronic ulcer of other part of unspecified foot with unspecified severity: Secondary | ICD-10-CM | POA: Diagnosis not present

## 2021-08-10 DIAGNOSIS — Z89422 Acquired absence of other left toe(s): Secondary | ICD-10-CM | POA: Diagnosis not present

## 2021-08-10 DIAGNOSIS — N186 End stage renal disease: Secondary | ICD-10-CM | POA: Diagnosis not present

## 2021-08-10 DIAGNOSIS — N2589 Other disorders resulting from impaired renal tubular function: Secondary | ICD-10-CM | POA: Diagnosis not present

## 2021-08-10 DIAGNOSIS — R17 Unspecified jaundice: Secondary | ICD-10-CM | POA: Diagnosis not present

## 2021-08-10 DIAGNOSIS — E440 Moderate protein-calorie malnutrition: Secondary | ICD-10-CM | POA: Diagnosis not present

## 2021-08-10 DIAGNOSIS — Z992 Dependence on renal dialysis: Secondary | ICD-10-CM | POA: Diagnosis not present

## 2021-08-10 DIAGNOSIS — N2581 Secondary hyperparathyroidism of renal origin: Secondary | ICD-10-CM | POA: Diagnosis not present

## 2021-08-10 DIAGNOSIS — E11621 Type 2 diabetes mellitus with foot ulcer: Secondary | ICD-10-CM | POA: Diagnosis not present

## 2021-08-10 DIAGNOSIS — D631 Anemia in chronic kidney disease: Secondary | ICD-10-CM | POA: Diagnosis not present

## 2021-08-11 DIAGNOSIS — N186 End stage renal disease: Secondary | ICD-10-CM | POA: Diagnosis not present

## 2021-08-11 DIAGNOSIS — N2589 Other disorders resulting from impaired renal tubular function: Secondary | ICD-10-CM | POA: Diagnosis not present

## 2021-08-11 DIAGNOSIS — R17 Unspecified jaundice: Secondary | ICD-10-CM | POA: Diagnosis not present

## 2021-08-11 DIAGNOSIS — Z79899 Other long term (current) drug therapy: Secondary | ICD-10-CM | POA: Diagnosis not present

## 2021-08-11 DIAGNOSIS — Z992 Dependence on renal dialysis: Secondary | ICD-10-CM | POA: Diagnosis not present

## 2021-08-11 DIAGNOSIS — N2581 Secondary hyperparathyroidism of renal origin: Secondary | ICD-10-CM | POA: Diagnosis not present

## 2021-08-11 DIAGNOSIS — D509 Iron deficiency anemia, unspecified: Secondary | ICD-10-CM | POA: Diagnosis not present

## 2021-08-11 DIAGNOSIS — E440 Moderate protein-calorie malnutrition: Secondary | ICD-10-CM | POA: Diagnosis not present

## 2021-08-11 DIAGNOSIS — D631 Anemia in chronic kidney disease: Secondary | ICD-10-CM | POA: Diagnosis not present

## 2021-08-12 DIAGNOSIS — E440 Moderate protein-calorie malnutrition: Secondary | ICD-10-CM | POA: Diagnosis not present

## 2021-08-12 DIAGNOSIS — Z992 Dependence on renal dialysis: Secondary | ICD-10-CM | POA: Diagnosis not present

## 2021-08-12 DIAGNOSIS — N2589 Other disorders resulting from impaired renal tubular function: Secondary | ICD-10-CM | POA: Diagnosis not present

## 2021-08-12 DIAGNOSIS — D509 Iron deficiency anemia, unspecified: Secondary | ICD-10-CM | POA: Diagnosis not present

## 2021-08-12 DIAGNOSIS — D631 Anemia in chronic kidney disease: Secondary | ICD-10-CM | POA: Diagnosis not present

## 2021-08-12 DIAGNOSIS — N2581 Secondary hyperparathyroidism of renal origin: Secondary | ICD-10-CM | POA: Diagnosis not present

## 2021-08-12 DIAGNOSIS — R17 Unspecified jaundice: Secondary | ICD-10-CM | POA: Diagnosis not present

## 2021-08-12 DIAGNOSIS — N186 End stage renal disease: Secondary | ICD-10-CM | POA: Diagnosis not present

## 2021-08-12 DIAGNOSIS — Z79899 Other long term (current) drug therapy: Secondary | ICD-10-CM | POA: Diagnosis not present

## 2021-08-13 DIAGNOSIS — Z992 Dependence on renal dialysis: Secondary | ICD-10-CM | POA: Diagnosis not present

## 2021-08-13 DIAGNOSIS — N2589 Other disorders resulting from impaired renal tubular function: Secondary | ICD-10-CM | POA: Diagnosis not present

## 2021-08-13 DIAGNOSIS — N2581 Secondary hyperparathyroidism of renal origin: Secondary | ICD-10-CM | POA: Diagnosis not present

## 2021-08-13 DIAGNOSIS — Z79899 Other long term (current) drug therapy: Secondary | ICD-10-CM | POA: Diagnosis not present

## 2021-08-13 DIAGNOSIS — R17 Unspecified jaundice: Secondary | ICD-10-CM | POA: Diagnosis not present

## 2021-08-13 DIAGNOSIS — D509 Iron deficiency anemia, unspecified: Secondary | ICD-10-CM | POA: Diagnosis not present

## 2021-08-13 DIAGNOSIS — N186 End stage renal disease: Secondary | ICD-10-CM | POA: Diagnosis not present

## 2021-08-13 DIAGNOSIS — D631 Anemia in chronic kidney disease: Secondary | ICD-10-CM | POA: Diagnosis not present

## 2021-08-13 DIAGNOSIS — E440 Moderate protein-calorie malnutrition: Secondary | ICD-10-CM | POA: Diagnosis not present

## 2021-08-14 DIAGNOSIS — Z992 Dependence on renal dialysis: Secondary | ICD-10-CM | POA: Diagnosis not present

## 2021-08-14 DIAGNOSIS — Z79899 Other long term (current) drug therapy: Secondary | ICD-10-CM | POA: Diagnosis not present

## 2021-08-14 DIAGNOSIS — E440 Moderate protein-calorie malnutrition: Secondary | ICD-10-CM | POA: Diagnosis not present

## 2021-08-14 DIAGNOSIS — N186 End stage renal disease: Secondary | ICD-10-CM | POA: Diagnosis not present

## 2021-08-14 DIAGNOSIS — H401122 Primary open-angle glaucoma, left eye, moderate stage: Secondary | ICD-10-CM | POA: Diagnosis not present

## 2021-08-14 DIAGNOSIS — N2589 Other disorders resulting from impaired renal tubular function: Secondary | ICD-10-CM | POA: Diagnosis not present

## 2021-08-14 DIAGNOSIS — D631 Anemia in chronic kidney disease: Secondary | ICD-10-CM | POA: Diagnosis not present

## 2021-08-14 DIAGNOSIS — H401132 Primary open-angle glaucoma, bilateral, moderate stage: Secondary | ICD-10-CM | POA: Diagnosis not present

## 2021-08-14 DIAGNOSIS — N2581 Secondary hyperparathyroidism of renal origin: Secondary | ICD-10-CM | POA: Diagnosis not present

## 2021-08-14 DIAGNOSIS — H25812 Combined forms of age-related cataract, left eye: Secondary | ICD-10-CM | POA: Diagnosis not present

## 2021-08-14 DIAGNOSIS — R17 Unspecified jaundice: Secondary | ICD-10-CM | POA: Diagnosis not present

## 2021-08-14 DIAGNOSIS — D509 Iron deficiency anemia, unspecified: Secondary | ICD-10-CM | POA: Diagnosis not present

## 2021-08-15 DIAGNOSIS — E440 Moderate protein-calorie malnutrition: Secondary | ICD-10-CM | POA: Diagnosis not present

## 2021-08-15 DIAGNOSIS — N2581 Secondary hyperparathyroidism of renal origin: Secondary | ICD-10-CM | POA: Diagnosis not present

## 2021-08-15 DIAGNOSIS — D631 Anemia in chronic kidney disease: Secondary | ICD-10-CM | POA: Diagnosis not present

## 2021-08-15 DIAGNOSIS — R17 Unspecified jaundice: Secondary | ICD-10-CM | POA: Diagnosis not present

## 2021-08-15 DIAGNOSIS — D509 Iron deficiency anemia, unspecified: Secondary | ICD-10-CM | POA: Diagnosis not present

## 2021-08-15 DIAGNOSIS — N186 End stage renal disease: Secondary | ICD-10-CM | POA: Diagnosis not present

## 2021-08-15 DIAGNOSIS — Z79899 Other long term (current) drug therapy: Secondary | ICD-10-CM | POA: Diagnosis not present

## 2021-08-15 DIAGNOSIS — Z992 Dependence on renal dialysis: Secondary | ICD-10-CM | POA: Diagnosis not present

## 2021-08-15 DIAGNOSIS — N2589 Other disorders resulting from impaired renal tubular function: Secondary | ICD-10-CM | POA: Diagnosis not present

## 2021-08-16 DIAGNOSIS — D631 Anemia in chronic kidney disease: Secondary | ICD-10-CM | POA: Diagnosis not present

## 2021-08-16 DIAGNOSIS — D509 Iron deficiency anemia, unspecified: Secondary | ICD-10-CM | POA: Diagnosis not present

## 2021-08-16 DIAGNOSIS — N186 End stage renal disease: Secondary | ICD-10-CM | POA: Diagnosis not present

## 2021-08-16 DIAGNOSIS — R17 Unspecified jaundice: Secondary | ICD-10-CM | POA: Diagnosis not present

## 2021-08-16 DIAGNOSIS — E440 Moderate protein-calorie malnutrition: Secondary | ICD-10-CM | POA: Diagnosis not present

## 2021-08-16 DIAGNOSIS — N2581 Secondary hyperparathyroidism of renal origin: Secondary | ICD-10-CM | POA: Diagnosis not present

## 2021-08-16 DIAGNOSIS — Z992 Dependence on renal dialysis: Secondary | ICD-10-CM | POA: Diagnosis not present

## 2021-08-16 DIAGNOSIS — Z79899 Other long term (current) drug therapy: Secondary | ICD-10-CM | POA: Diagnosis not present

## 2021-08-16 DIAGNOSIS — N2589 Other disorders resulting from impaired renal tubular function: Secondary | ICD-10-CM | POA: Diagnosis not present

## 2021-08-17 DIAGNOSIS — Z992 Dependence on renal dialysis: Secondary | ICD-10-CM | POA: Diagnosis not present

## 2021-08-17 DIAGNOSIS — N186 End stage renal disease: Secondary | ICD-10-CM | POA: Diagnosis not present

## 2021-08-17 DIAGNOSIS — E440 Moderate protein-calorie malnutrition: Secondary | ICD-10-CM | POA: Diagnosis not present

## 2021-08-17 DIAGNOSIS — D509 Iron deficiency anemia, unspecified: Secondary | ICD-10-CM | POA: Diagnosis not present

## 2021-08-17 DIAGNOSIS — D631 Anemia in chronic kidney disease: Secondary | ICD-10-CM | POA: Diagnosis not present

## 2021-08-17 DIAGNOSIS — N2589 Other disorders resulting from impaired renal tubular function: Secondary | ICD-10-CM | POA: Diagnosis not present

## 2021-08-17 DIAGNOSIS — R17 Unspecified jaundice: Secondary | ICD-10-CM | POA: Diagnosis not present

## 2021-08-17 DIAGNOSIS — N2581 Secondary hyperparathyroidism of renal origin: Secondary | ICD-10-CM | POA: Diagnosis not present

## 2021-08-17 DIAGNOSIS — Z79899 Other long term (current) drug therapy: Secondary | ICD-10-CM | POA: Diagnosis not present

## 2021-08-18 DIAGNOSIS — D509 Iron deficiency anemia, unspecified: Secondary | ICD-10-CM | POA: Diagnosis not present

## 2021-08-18 DIAGNOSIS — Z992 Dependence on renal dialysis: Secondary | ICD-10-CM | POA: Diagnosis not present

## 2021-08-18 DIAGNOSIS — N186 End stage renal disease: Secondary | ICD-10-CM | POA: Diagnosis not present

## 2021-08-18 DIAGNOSIS — N2589 Other disorders resulting from impaired renal tubular function: Secondary | ICD-10-CM | POA: Diagnosis not present

## 2021-08-18 DIAGNOSIS — N2581 Secondary hyperparathyroidism of renal origin: Secondary | ICD-10-CM | POA: Diagnosis not present

## 2021-08-18 DIAGNOSIS — E440 Moderate protein-calorie malnutrition: Secondary | ICD-10-CM | POA: Diagnosis not present

## 2021-08-18 DIAGNOSIS — Z79899 Other long term (current) drug therapy: Secondary | ICD-10-CM | POA: Diagnosis not present

## 2021-08-18 DIAGNOSIS — D631 Anemia in chronic kidney disease: Secondary | ICD-10-CM | POA: Diagnosis not present

## 2021-08-18 DIAGNOSIS — R17 Unspecified jaundice: Secondary | ICD-10-CM | POA: Diagnosis not present

## 2021-08-19 DIAGNOSIS — R17 Unspecified jaundice: Secondary | ICD-10-CM | POA: Diagnosis not present

## 2021-08-19 DIAGNOSIS — N2589 Other disorders resulting from impaired renal tubular function: Secondary | ICD-10-CM | POA: Diagnosis not present

## 2021-08-19 DIAGNOSIS — Z992 Dependence on renal dialysis: Secondary | ICD-10-CM | POA: Diagnosis not present

## 2021-08-19 DIAGNOSIS — N186 End stage renal disease: Secondary | ICD-10-CM | POA: Diagnosis not present

## 2021-08-19 DIAGNOSIS — D631 Anemia in chronic kidney disease: Secondary | ICD-10-CM | POA: Diagnosis not present

## 2021-08-19 DIAGNOSIS — D509 Iron deficiency anemia, unspecified: Secondary | ICD-10-CM | POA: Diagnosis not present

## 2021-08-19 DIAGNOSIS — Z79899 Other long term (current) drug therapy: Secondary | ICD-10-CM | POA: Diagnosis not present

## 2021-08-19 DIAGNOSIS — E440 Moderate protein-calorie malnutrition: Secondary | ICD-10-CM | POA: Diagnosis not present

## 2021-08-19 DIAGNOSIS — N2581 Secondary hyperparathyroidism of renal origin: Secondary | ICD-10-CM | POA: Diagnosis not present

## 2021-08-20 DIAGNOSIS — N186 End stage renal disease: Secondary | ICD-10-CM | POA: Diagnosis not present

## 2021-08-20 DIAGNOSIS — Z79899 Other long term (current) drug therapy: Secondary | ICD-10-CM | POA: Diagnosis not present

## 2021-08-20 DIAGNOSIS — Z992 Dependence on renal dialysis: Secondary | ICD-10-CM | POA: Diagnosis not present

## 2021-08-20 DIAGNOSIS — N2589 Other disorders resulting from impaired renal tubular function: Secondary | ICD-10-CM | POA: Diagnosis not present

## 2021-08-20 DIAGNOSIS — N2581 Secondary hyperparathyroidism of renal origin: Secondary | ICD-10-CM | POA: Diagnosis not present

## 2021-08-20 DIAGNOSIS — D631 Anemia in chronic kidney disease: Secondary | ICD-10-CM | POA: Diagnosis not present

## 2021-08-20 DIAGNOSIS — E440 Moderate protein-calorie malnutrition: Secondary | ICD-10-CM | POA: Diagnosis not present

## 2021-08-20 DIAGNOSIS — D509 Iron deficiency anemia, unspecified: Secondary | ICD-10-CM | POA: Diagnosis not present

## 2021-08-20 DIAGNOSIS — R17 Unspecified jaundice: Secondary | ICD-10-CM | POA: Diagnosis not present

## 2021-08-21 DIAGNOSIS — D509 Iron deficiency anemia, unspecified: Secondary | ICD-10-CM | POA: Diagnosis not present

## 2021-08-21 DIAGNOSIS — R17 Unspecified jaundice: Secondary | ICD-10-CM | POA: Diagnosis not present

## 2021-08-21 DIAGNOSIS — Z79899 Other long term (current) drug therapy: Secondary | ICD-10-CM | POA: Diagnosis not present

## 2021-08-21 DIAGNOSIS — E440 Moderate protein-calorie malnutrition: Secondary | ICD-10-CM | POA: Diagnosis not present

## 2021-08-21 DIAGNOSIS — Z992 Dependence on renal dialysis: Secondary | ICD-10-CM | POA: Diagnosis not present

## 2021-08-21 DIAGNOSIS — N186 End stage renal disease: Secondary | ICD-10-CM | POA: Diagnosis not present

## 2021-08-21 DIAGNOSIS — D631 Anemia in chronic kidney disease: Secondary | ICD-10-CM | POA: Diagnosis not present

## 2021-08-21 DIAGNOSIS — N2581 Secondary hyperparathyroidism of renal origin: Secondary | ICD-10-CM | POA: Diagnosis not present

## 2021-08-21 DIAGNOSIS — N2589 Other disorders resulting from impaired renal tubular function: Secondary | ICD-10-CM | POA: Diagnosis not present

## 2021-08-22 DIAGNOSIS — N2589 Other disorders resulting from impaired renal tubular function: Secondary | ICD-10-CM | POA: Diagnosis not present

## 2021-08-22 DIAGNOSIS — D509 Iron deficiency anemia, unspecified: Secondary | ICD-10-CM | POA: Diagnosis not present

## 2021-08-22 DIAGNOSIS — E440 Moderate protein-calorie malnutrition: Secondary | ICD-10-CM | POA: Diagnosis not present

## 2021-08-22 DIAGNOSIS — N2581 Secondary hyperparathyroidism of renal origin: Secondary | ICD-10-CM | POA: Diagnosis not present

## 2021-08-22 DIAGNOSIS — N186 End stage renal disease: Secondary | ICD-10-CM | POA: Diagnosis not present

## 2021-08-22 DIAGNOSIS — D631 Anemia in chronic kidney disease: Secondary | ICD-10-CM | POA: Diagnosis not present

## 2021-08-22 DIAGNOSIS — R17 Unspecified jaundice: Secondary | ICD-10-CM | POA: Diagnosis not present

## 2021-08-22 DIAGNOSIS — Z992 Dependence on renal dialysis: Secondary | ICD-10-CM | POA: Diagnosis not present

## 2021-08-22 DIAGNOSIS — Z79899 Other long term (current) drug therapy: Secondary | ICD-10-CM | POA: Diagnosis not present

## 2021-08-23 DIAGNOSIS — N2589 Other disorders resulting from impaired renal tubular function: Secondary | ICD-10-CM | POA: Diagnosis not present

## 2021-08-23 DIAGNOSIS — D631 Anemia in chronic kidney disease: Secondary | ICD-10-CM | POA: Diagnosis not present

## 2021-08-23 DIAGNOSIS — E440 Moderate protein-calorie malnutrition: Secondary | ICD-10-CM | POA: Diagnosis not present

## 2021-08-23 DIAGNOSIS — Z992 Dependence on renal dialysis: Secondary | ICD-10-CM | POA: Diagnosis not present

## 2021-08-23 DIAGNOSIS — Z79899 Other long term (current) drug therapy: Secondary | ICD-10-CM | POA: Diagnosis not present

## 2021-08-23 DIAGNOSIS — D509 Iron deficiency anemia, unspecified: Secondary | ICD-10-CM | POA: Diagnosis not present

## 2021-08-23 DIAGNOSIS — R17 Unspecified jaundice: Secondary | ICD-10-CM | POA: Diagnosis not present

## 2021-08-23 DIAGNOSIS — N2581 Secondary hyperparathyroidism of renal origin: Secondary | ICD-10-CM | POA: Diagnosis not present

## 2021-08-23 DIAGNOSIS — N186 End stage renal disease: Secondary | ICD-10-CM | POA: Diagnosis not present

## 2021-08-24 DIAGNOSIS — Z992 Dependence on renal dialysis: Secondary | ICD-10-CM | POA: Diagnosis not present

## 2021-08-24 DIAGNOSIS — D631 Anemia in chronic kidney disease: Secondary | ICD-10-CM | POA: Diagnosis not present

## 2021-08-24 DIAGNOSIS — R17 Unspecified jaundice: Secondary | ICD-10-CM | POA: Diagnosis not present

## 2021-08-24 DIAGNOSIS — N2581 Secondary hyperparathyroidism of renal origin: Secondary | ICD-10-CM | POA: Diagnosis not present

## 2021-08-24 DIAGNOSIS — G4733 Obstructive sleep apnea (adult) (pediatric): Secondary | ICD-10-CM | POA: Diagnosis not present

## 2021-08-24 DIAGNOSIS — Z79899 Other long term (current) drug therapy: Secondary | ICD-10-CM | POA: Diagnosis not present

## 2021-08-24 DIAGNOSIS — E440 Moderate protein-calorie malnutrition: Secondary | ICD-10-CM | POA: Diagnosis not present

## 2021-08-24 DIAGNOSIS — N2589 Other disorders resulting from impaired renal tubular function: Secondary | ICD-10-CM | POA: Diagnosis not present

## 2021-08-24 DIAGNOSIS — D509 Iron deficiency anemia, unspecified: Secondary | ICD-10-CM | POA: Diagnosis not present

## 2021-08-24 DIAGNOSIS — N186 End stage renal disease: Secondary | ICD-10-CM | POA: Diagnosis not present

## 2021-08-25 DIAGNOSIS — N186 End stage renal disease: Secondary | ICD-10-CM | POA: Diagnosis not present

## 2021-08-25 DIAGNOSIS — Z992 Dependence on renal dialysis: Secondary | ICD-10-CM | POA: Diagnosis not present

## 2021-08-25 DIAGNOSIS — R17 Unspecified jaundice: Secondary | ICD-10-CM | POA: Diagnosis not present

## 2021-08-25 DIAGNOSIS — E440 Moderate protein-calorie malnutrition: Secondary | ICD-10-CM | POA: Diagnosis not present

## 2021-08-25 DIAGNOSIS — D509 Iron deficiency anemia, unspecified: Secondary | ICD-10-CM | POA: Diagnosis not present

## 2021-08-25 DIAGNOSIS — Z79899 Other long term (current) drug therapy: Secondary | ICD-10-CM | POA: Diagnosis not present

## 2021-08-25 DIAGNOSIS — D631 Anemia in chronic kidney disease: Secondary | ICD-10-CM | POA: Diagnosis not present

## 2021-08-25 DIAGNOSIS — N2589 Other disorders resulting from impaired renal tubular function: Secondary | ICD-10-CM | POA: Diagnosis not present

## 2021-08-25 DIAGNOSIS — N2581 Secondary hyperparathyroidism of renal origin: Secondary | ICD-10-CM | POA: Diagnosis not present

## 2021-08-26 DIAGNOSIS — Z79899 Other long term (current) drug therapy: Secondary | ICD-10-CM | POA: Diagnosis not present

## 2021-08-26 DIAGNOSIS — N186 End stage renal disease: Secondary | ICD-10-CM | POA: Diagnosis not present

## 2021-08-26 DIAGNOSIS — N2581 Secondary hyperparathyroidism of renal origin: Secondary | ICD-10-CM | POA: Diagnosis not present

## 2021-08-26 DIAGNOSIS — D631 Anemia in chronic kidney disease: Secondary | ICD-10-CM | POA: Diagnosis not present

## 2021-08-26 DIAGNOSIS — N2589 Other disorders resulting from impaired renal tubular function: Secondary | ICD-10-CM | POA: Diagnosis not present

## 2021-08-26 DIAGNOSIS — E440 Moderate protein-calorie malnutrition: Secondary | ICD-10-CM | POA: Diagnosis not present

## 2021-08-26 DIAGNOSIS — Z992 Dependence on renal dialysis: Secondary | ICD-10-CM | POA: Diagnosis not present

## 2021-08-26 DIAGNOSIS — D509 Iron deficiency anemia, unspecified: Secondary | ICD-10-CM | POA: Diagnosis not present

## 2021-08-26 DIAGNOSIS — R17 Unspecified jaundice: Secondary | ICD-10-CM | POA: Diagnosis not present

## 2021-08-27 DIAGNOSIS — L97522 Non-pressure chronic ulcer of other part of left foot with fat layer exposed: Secondary | ICD-10-CM | POA: Diagnosis not present

## 2021-08-27 DIAGNOSIS — L97509 Non-pressure chronic ulcer of other part of unspecified foot with unspecified severity: Secondary | ICD-10-CM | POA: Diagnosis not present

## 2021-08-27 DIAGNOSIS — N186 End stage renal disease: Secondary | ICD-10-CM | POA: Diagnosis not present

## 2021-08-27 DIAGNOSIS — E11621 Type 2 diabetes mellitus with foot ulcer: Secondary | ICD-10-CM | POA: Diagnosis not present

## 2021-08-27 DIAGNOSIS — Z992 Dependence on renal dialysis: Secondary | ICD-10-CM | POA: Diagnosis not present

## 2021-08-27 DIAGNOSIS — N2581 Secondary hyperparathyroidism of renal origin: Secondary | ICD-10-CM | POA: Diagnosis not present

## 2021-08-27 DIAGNOSIS — R17 Unspecified jaundice: Secondary | ICD-10-CM | POA: Diagnosis not present

## 2021-08-27 DIAGNOSIS — D631 Anemia in chronic kidney disease: Secondary | ICD-10-CM | POA: Diagnosis not present

## 2021-08-27 DIAGNOSIS — N2589 Other disorders resulting from impaired renal tubular function: Secondary | ICD-10-CM | POA: Diagnosis not present

## 2021-08-27 DIAGNOSIS — E440 Moderate protein-calorie malnutrition: Secondary | ICD-10-CM | POA: Diagnosis not present

## 2021-08-27 DIAGNOSIS — Z79899 Other long term (current) drug therapy: Secondary | ICD-10-CM | POA: Diagnosis not present

## 2021-08-27 DIAGNOSIS — Z794 Long term (current) use of insulin: Secondary | ICD-10-CM | POA: Diagnosis not present

## 2021-08-27 DIAGNOSIS — D509 Iron deficiency anemia, unspecified: Secondary | ICD-10-CM | POA: Diagnosis not present

## 2021-08-28 DIAGNOSIS — D509 Iron deficiency anemia, unspecified: Secondary | ICD-10-CM | POA: Diagnosis not present

## 2021-08-28 DIAGNOSIS — R17 Unspecified jaundice: Secondary | ICD-10-CM | POA: Diagnosis not present

## 2021-08-28 DIAGNOSIS — D631 Anemia in chronic kidney disease: Secondary | ICD-10-CM | POA: Diagnosis not present

## 2021-08-28 DIAGNOSIS — N2581 Secondary hyperparathyroidism of renal origin: Secondary | ICD-10-CM | POA: Diagnosis not present

## 2021-08-28 DIAGNOSIS — Z992 Dependence on renal dialysis: Secondary | ICD-10-CM | POA: Diagnosis not present

## 2021-08-28 DIAGNOSIS — N186 End stage renal disease: Secondary | ICD-10-CM | POA: Diagnosis not present

## 2021-08-28 DIAGNOSIS — Z79899 Other long term (current) drug therapy: Secondary | ICD-10-CM | POA: Diagnosis not present

## 2021-08-28 DIAGNOSIS — N2589 Other disorders resulting from impaired renal tubular function: Secondary | ICD-10-CM | POA: Diagnosis not present

## 2021-08-28 DIAGNOSIS — E440 Moderate protein-calorie malnutrition: Secondary | ICD-10-CM | POA: Diagnosis not present

## 2021-08-29 DIAGNOSIS — D631 Anemia in chronic kidney disease: Secondary | ICD-10-CM | POA: Diagnosis not present

## 2021-08-29 DIAGNOSIS — N186 End stage renal disease: Secondary | ICD-10-CM | POA: Diagnosis not present

## 2021-08-29 DIAGNOSIS — R17 Unspecified jaundice: Secondary | ICD-10-CM | POA: Diagnosis not present

## 2021-08-29 DIAGNOSIS — E440 Moderate protein-calorie malnutrition: Secondary | ICD-10-CM | POA: Diagnosis not present

## 2021-08-29 DIAGNOSIS — Z79899 Other long term (current) drug therapy: Secondary | ICD-10-CM | POA: Diagnosis not present

## 2021-08-29 DIAGNOSIS — D509 Iron deficiency anemia, unspecified: Secondary | ICD-10-CM | POA: Diagnosis not present

## 2021-08-29 DIAGNOSIS — Z992 Dependence on renal dialysis: Secondary | ICD-10-CM | POA: Diagnosis not present

## 2021-08-29 DIAGNOSIS — N2589 Other disorders resulting from impaired renal tubular function: Secondary | ICD-10-CM | POA: Diagnosis not present

## 2021-08-29 DIAGNOSIS — N2581 Secondary hyperparathyroidism of renal origin: Secondary | ICD-10-CM | POA: Diagnosis not present

## 2021-08-30 DIAGNOSIS — Z992 Dependence on renal dialysis: Secondary | ICD-10-CM | POA: Diagnosis not present

## 2021-08-30 DIAGNOSIS — D631 Anemia in chronic kidney disease: Secondary | ICD-10-CM | POA: Diagnosis not present

## 2021-08-30 DIAGNOSIS — N2581 Secondary hyperparathyroidism of renal origin: Secondary | ICD-10-CM | POA: Diagnosis not present

## 2021-08-30 DIAGNOSIS — R17 Unspecified jaundice: Secondary | ICD-10-CM | POA: Diagnosis not present

## 2021-08-30 DIAGNOSIS — N2589 Other disorders resulting from impaired renal tubular function: Secondary | ICD-10-CM | POA: Diagnosis not present

## 2021-08-30 DIAGNOSIS — N186 End stage renal disease: Secondary | ICD-10-CM | POA: Diagnosis not present

## 2021-08-30 DIAGNOSIS — Z79899 Other long term (current) drug therapy: Secondary | ICD-10-CM | POA: Diagnosis not present

## 2021-08-30 DIAGNOSIS — D509 Iron deficiency anemia, unspecified: Secondary | ICD-10-CM | POA: Diagnosis not present

## 2021-08-30 DIAGNOSIS — E440 Moderate protein-calorie malnutrition: Secondary | ICD-10-CM | POA: Diagnosis not present

## 2021-08-31 DIAGNOSIS — Z79899 Other long term (current) drug therapy: Secondary | ICD-10-CM | POA: Diagnosis not present

## 2021-08-31 DIAGNOSIS — E440 Moderate protein-calorie malnutrition: Secondary | ICD-10-CM | POA: Diagnosis not present

## 2021-08-31 DIAGNOSIS — D509 Iron deficiency anemia, unspecified: Secondary | ICD-10-CM | POA: Diagnosis not present

## 2021-08-31 DIAGNOSIS — R17 Unspecified jaundice: Secondary | ICD-10-CM | POA: Diagnosis not present

## 2021-08-31 DIAGNOSIS — Z992 Dependence on renal dialysis: Secondary | ICD-10-CM | POA: Diagnosis not present

## 2021-08-31 DIAGNOSIS — N186 End stage renal disease: Secondary | ICD-10-CM | POA: Diagnosis not present

## 2021-08-31 DIAGNOSIS — N2589 Other disorders resulting from impaired renal tubular function: Secondary | ICD-10-CM | POA: Diagnosis not present

## 2021-08-31 DIAGNOSIS — D631 Anemia in chronic kidney disease: Secondary | ICD-10-CM | POA: Diagnosis not present

## 2021-08-31 DIAGNOSIS — N2581 Secondary hyperparathyroidism of renal origin: Secondary | ICD-10-CM | POA: Diagnosis not present

## 2021-09-01 DIAGNOSIS — N2581 Secondary hyperparathyroidism of renal origin: Secondary | ICD-10-CM | POA: Diagnosis not present

## 2021-09-01 DIAGNOSIS — R17 Unspecified jaundice: Secondary | ICD-10-CM | POA: Diagnosis not present

## 2021-09-01 DIAGNOSIS — Z992 Dependence on renal dialysis: Secondary | ICD-10-CM | POA: Diagnosis not present

## 2021-09-01 DIAGNOSIS — E440 Moderate protein-calorie malnutrition: Secondary | ICD-10-CM | POA: Diagnosis not present

## 2021-09-01 DIAGNOSIS — D631 Anemia in chronic kidney disease: Secondary | ICD-10-CM | POA: Diagnosis not present

## 2021-09-01 DIAGNOSIS — D509 Iron deficiency anemia, unspecified: Secondary | ICD-10-CM | POA: Diagnosis not present

## 2021-09-01 DIAGNOSIS — Z79899 Other long term (current) drug therapy: Secondary | ICD-10-CM | POA: Diagnosis not present

## 2021-09-01 DIAGNOSIS — N186 End stage renal disease: Secondary | ICD-10-CM | POA: Diagnosis not present

## 2021-09-01 DIAGNOSIS — N2589 Other disorders resulting from impaired renal tubular function: Secondary | ICD-10-CM | POA: Diagnosis not present

## 2021-09-02 DIAGNOSIS — N2589 Other disorders resulting from impaired renal tubular function: Secondary | ICD-10-CM | POA: Diagnosis not present

## 2021-09-02 DIAGNOSIS — N2581 Secondary hyperparathyroidism of renal origin: Secondary | ICD-10-CM | POA: Diagnosis not present

## 2021-09-02 DIAGNOSIS — R17 Unspecified jaundice: Secondary | ICD-10-CM | POA: Diagnosis not present

## 2021-09-02 DIAGNOSIS — Z992 Dependence on renal dialysis: Secondary | ICD-10-CM | POA: Diagnosis not present

## 2021-09-02 DIAGNOSIS — Z79899 Other long term (current) drug therapy: Secondary | ICD-10-CM | POA: Diagnosis not present

## 2021-09-02 DIAGNOSIS — N186 End stage renal disease: Secondary | ICD-10-CM | POA: Diagnosis not present

## 2021-09-02 DIAGNOSIS — D631 Anemia in chronic kidney disease: Secondary | ICD-10-CM | POA: Diagnosis not present

## 2021-09-02 DIAGNOSIS — E440 Moderate protein-calorie malnutrition: Secondary | ICD-10-CM | POA: Diagnosis not present

## 2021-09-02 DIAGNOSIS — D509 Iron deficiency anemia, unspecified: Secondary | ICD-10-CM | POA: Diagnosis not present

## 2021-09-03 DIAGNOSIS — Z79899 Other long term (current) drug therapy: Secondary | ICD-10-CM | POA: Diagnosis not present

## 2021-09-03 DIAGNOSIS — N2581 Secondary hyperparathyroidism of renal origin: Secondary | ICD-10-CM | POA: Diagnosis not present

## 2021-09-03 DIAGNOSIS — N2589 Other disorders resulting from impaired renal tubular function: Secondary | ICD-10-CM | POA: Diagnosis not present

## 2021-09-03 DIAGNOSIS — R17 Unspecified jaundice: Secondary | ICD-10-CM | POA: Diagnosis not present

## 2021-09-03 DIAGNOSIS — N186 End stage renal disease: Secondary | ICD-10-CM | POA: Diagnosis not present

## 2021-09-03 DIAGNOSIS — E440 Moderate protein-calorie malnutrition: Secondary | ICD-10-CM | POA: Diagnosis not present

## 2021-09-03 DIAGNOSIS — D509 Iron deficiency anemia, unspecified: Secondary | ICD-10-CM | POA: Diagnosis not present

## 2021-09-03 DIAGNOSIS — D631 Anemia in chronic kidney disease: Secondary | ICD-10-CM | POA: Diagnosis not present

## 2021-09-03 DIAGNOSIS — Z992 Dependence on renal dialysis: Secondary | ICD-10-CM | POA: Diagnosis not present

## 2021-09-04 DIAGNOSIS — R17 Unspecified jaundice: Secondary | ICD-10-CM | POA: Diagnosis not present

## 2021-09-04 DIAGNOSIS — D631 Anemia in chronic kidney disease: Secondary | ICD-10-CM | POA: Diagnosis not present

## 2021-09-04 DIAGNOSIS — Z992 Dependence on renal dialysis: Secondary | ICD-10-CM | POA: Diagnosis not present

## 2021-09-04 DIAGNOSIS — Z79899 Other long term (current) drug therapy: Secondary | ICD-10-CM | POA: Diagnosis not present

## 2021-09-04 DIAGNOSIS — N186 End stage renal disease: Secondary | ICD-10-CM | POA: Diagnosis not present

## 2021-09-04 DIAGNOSIS — D509 Iron deficiency anemia, unspecified: Secondary | ICD-10-CM | POA: Diagnosis not present

## 2021-09-04 DIAGNOSIS — E440 Moderate protein-calorie malnutrition: Secondary | ICD-10-CM | POA: Diagnosis not present

## 2021-09-04 DIAGNOSIS — N2589 Other disorders resulting from impaired renal tubular function: Secondary | ICD-10-CM | POA: Diagnosis not present

## 2021-09-04 DIAGNOSIS — N2581 Secondary hyperparathyroidism of renal origin: Secondary | ICD-10-CM | POA: Diagnosis not present

## 2021-09-05 DIAGNOSIS — D509 Iron deficiency anemia, unspecified: Secondary | ICD-10-CM | POA: Diagnosis not present

## 2021-09-05 DIAGNOSIS — N2581 Secondary hyperparathyroidism of renal origin: Secondary | ICD-10-CM | POA: Diagnosis not present

## 2021-09-05 DIAGNOSIS — N2589 Other disorders resulting from impaired renal tubular function: Secondary | ICD-10-CM | POA: Diagnosis not present

## 2021-09-05 DIAGNOSIS — D631 Anemia in chronic kidney disease: Secondary | ICD-10-CM | POA: Diagnosis not present

## 2021-09-05 DIAGNOSIS — N186 End stage renal disease: Secondary | ICD-10-CM | POA: Diagnosis not present

## 2021-09-05 DIAGNOSIS — Z79899 Other long term (current) drug therapy: Secondary | ICD-10-CM | POA: Diagnosis not present

## 2021-09-05 DIAGNOSIS — Z992 Dependence on renal dialysis: Secondary | ICD-10-CM | POA: Diagnosis not present

## 2021-09-05 DIAGNOSIS — R17 Unspecified jaundice: Secondary | ICD-10-CM | POA: Diagnosis not present

## 2021-09-05 DIAGNOSIS — E440 Moderate protein-calorie malnutrition: Secondary | ICD-10-CM | POA: Diagnosis not present

## 2021-09-06 DIAGNOSIS — N186 End stage renal disease: Secondary | ICD-10-CM | POA: Diagnosis not present

## 2021-09-06 DIAGNOSIS — Z992 Dependence on renal dialysis: Secondary | ICD-10-CM | POA: Diagnosis not present

## 2021-09-06 DIAGNOSIS — Z79899 Other long term (current) drug therapy: Secondary | ICD-10-CM | POA: Diagnosis not present

## 2021-09-06 DIAGNOSIS — D631 Anemia in chronic kidney disease: Secondary | ICD-10-CM | POA: Diagnosis not present

## 2021-09-06 DIAGNOSIS — N2589 Other disorders resulting from impaired renal tubular function: Secondary | ICD-10-CM | POA: Diagnosis not present

## 2021-09-06 DIAGNOSIS — E440 Moderate protein-calorie malnutrition: Secondary | ICD-10-CM | POA: Diagnosis not present

## 2021-09-06 DIAGNOSIS — D509 Iron deficiency anemia, unspecified: Secondary | ICD-10-CM | POA: Diagnosis not present

## 2021-09-06 DIAGNOSIS — R17 Unspecified jaundice: Secondary | ICD-10-CM | POA: Diagnosis not present

## 2021-09-06 DIAGNOSIS — N2581 Secondary hyperparathyroidism of renal origin: Secondary | ICD-10-CM | POA: Diagnosis not present

## 2021-09-07 DIAGNOSIS — N2581 Secondary hyperparathyroidism of renal origin: Secondary | ICD-10-CM | POA: Diagnosis not present

## 2021-09-07 DIAGNOSIS — E440 Moderate protein-calorie malnutrition: Secondary | ICD-10-CM | POA: Diagnosis not present

## 2021-09-07 DIAGNOSIS — E1122 Type 2 diabetes mellitus with diabetic chronic kidney disease: Secondary | ICD-10-CM | POA: Diagnosis not present

## 2021-09-07 DIAGNOSIS — D631 Anemia in chronic kidney disease: Secondary | ICD-10-CM | POA: Diagnosis not present

## 2021-09-07 DIAGNOSIS — N2589 Other disorders resulting from impaired renal tubular function: Secondary | ICD-10-CM | POA: Diagnosis not present

## 2021-09-07 DIAGNOSIS — D509 Iron deficiency anemia, unspecified: Secondary | ICD-10-CM | POA: Diagnosis not present

## 2021-09-07 DIAGNOSIS — R17 Unspecified jaundice: Secondary | ICD-10-CM | POA: Diagnosis not present

## 2021-09-07 DIAGNOSIS — Z79899 Other long term (current) drug therapy: Secondary | ICD-10-CM | POA: Diagnosis not present

## 2021-09-07 DIAGNOSIS — N186 End stage renal disease: Secondary | ICD-10-CM | POA: Diagnosis not present

## 2021-09-07 DIAGNOSIS — Z992 Dependence on renal dialysis: Secondary | ICD-10-CM | POA: Diagnosis not present

## 2021-09-08 DIAGNOSIS — N186 End stage renal disease: Secondary | ICD-10-CM | POA: Diagnosis not present

## 2021-09-08 DIAGNOSIS — N2589 Other disorders resulting from impaired renal tubular function: Secondary | ICD-10-CM | POA: Diagnosis not present

## 2021-09-08 DIAGNOSIS — D631 Anemia in chronic kidney disease: Secondary | ICD-10-CM | POA: Diagnosis not present

## 2021-09-08 DIAGNOSIS — E440 Moderate protein-calorie malnutrition: Secondary | ICD-10-CM | POA: Diagnosis not present

## 2021-09-08 DIAGNOSIS — D509 Iron deficiency anemia, unspecified: Secondary | ICD-10-CM | POA: Diagnosis not present

## 2021-09-08 DIAGNOSIS — N2581 Secondary hyperparathyroidism of renal origin: Secondary | ICD-10-CM | POA: Diagnosis not present

## 2021-09-08 DIAGNOSIS — Z992 Dependence on renal dialysis: Secondary | ICD-10-CM | POA: Diagnosis not present

## 2021-09-08 DIAGNOSIS — Z79899 Other long term (current) drug therapy: Secondary | ICD-10-CM | POA: Diagnosis not present

## 2021-09-08 DIAGNOSIS — R17 Unspecified jaundice: Secondary | ICD-10-CM | POA: Diagnosis not present

## 2021-09-09 DIAGNOSIS — R17 Unspecified jaundice: Secondary | ICD-10-CM | POA: Diagnosis not present

## 2021-09-09 DIAGNOSIS — N2581 Secondary hyperparathyroidism of renal origin: Secondary | ICD-10-CM | POA: Diagnosis not present

## 2021-09-09 DIAGNOSIS — D631 Anemia in chronic kidney disease: Secondary | ICD-10-CM | POA: Diagnosis not present

## 2021-09-09 DIAGNOSIS — D509 Iron deficiency anemia, unspecified: Secondary | ICD-10-CM | POA: Diagnosis not present

## 2021-09-09 DIAGNOSIS — N2589 Other disorders resulting from impaired renal tubular function: Secondary | ICD-10-CM | POA: Diagnosis not present

## 2021-09-09 DIAGNOSIS — N186 End stage renal disease: Secondary | ICD-10-CM | POA: Diagnosis not present

## 2021-09-09 DIAGNOSIS — E440 Moderate protein-calorie malnutrition: Secondary | ICD-10-CM | POA: Diagnosis not present

## 2021-09-09 DIAGNOSIS — Z79899 Other long term (current) drug therapy: Secondary | ICD-10-CM | POA: Diagnosis not present

## 2021-09-09 DIAGNOSIS — Z992 Dependence on renal dialysis: Secondary | ICD-10-CM | POA: Diagnosis not present

## 2021-09-10 DIAGNOSIS — D631 Anemia in chronic kidney disease: Secondary | ICD-10-CM | POA: Diagnosis not present

## 2021-09-10 DIAGNOSIS — Z79899 Other long term (current) drug therapy: Secondary | ICD-10-CM | POA: Diagnosis not present

## 2021-09-10 DIAGNOSIS — D509 Iron deficiency anemia, unspecified: Secondary | ICD-10-CM | POA: Diagnosis not present

## 2021-09-10 DIAGNOSIS — R17 Unspecified jaundice: Secondary | ICD-10-CM | POA: Diagnosis not present

## 2021-09-10 DIAGNOSIS — N186 End stage renal disease: Secondary | ICD-10-CM | POA: Diagnosis not present

## 2021-09-10 DIAGNOSIS — E440 Moderate protein-calorie malnutrition: Secondary | ICD-10-CM | POA: Diagnosis not present

## 2021-09-10 DIAGNOSIS — N2589 Other disorders resulting from impaired renal tubular function: Secondary | ICD-10-CM | POA: Diagnosis not present

## 2021-09-10 DIAGNOSIS — N2581 Secondary hyperparathyroidism of renal origin: Secondary | ICD-10-CM | POA: Diagnosis not present

## 2021-09-10 DIAGNOSIS — Z992 Dependence on renal dialysis: Secondary | ICD-10-CM | POA: Diagnosis not present

## 2021-09-11 DIAGNOSIS — N186 End stage renal disease: Secondary | ICD-10-CM | POA: Diagnosis not present

## 2021-09-11 DIAGNOSIS — Z79899 Other long term (current) drug therapy: Secondary | ICD-10-CM | POA: Diagnosis not present

## 2021-09-11 DIAGNOSIS — D631 Anemia in chronic kidney disease: Secondary | ICD-10-CM | POA: Diagnosis not present

## 2021-09-11 DIAGNOSIS — D509 Iron deficiency anemia, unspecified: Secondary | ICD-10-CM | POA: Diagnosis not present

## 2021-09-11 DIAGNOSIS — R17 Unspecified jaundice: Secondary | ICD-10-CM | POA: Diagnosis not present

## 2021-09-11 DIAGNOSIS — N2581 Secondary hyperparathyroidism of renal origin: Secondary | ICD-10-CM | POA: Diagnosis not present

## 2021-09-11 DIAGNOSIS — Z992 Dependence on renal dialysis: Secondary | ICD-10-CM | POA: Diagnosis not present

## 2021-09-11 DIAGNOSIS — N2589 Other disorders resulting from impaired renal tubular function: Secondary | ICD-10-CM | POA: Diagnosis not present

## 2021-09-11 DIAGNOSIS — E440 Moderate protein-calorie malnutrition: Secondary | ICD-10-CM | POA: Diagnosis not present

## 2021-09-12 DIAGNOSIS — N2589 Other disorders resulting from impaired renal tubular function: Secondary | ICD-10-CM | POA: Diagnosis not present

## 2021-09-12 DIAGNOSIS — E440 Moderate protein-calorie malnutrition: Secondary | ICD-10-CM | POA: Diagnosis not present

## 2021-09-12 DIAGNOSIS — D631 Anemia in chronic kidney disease: Secondary | ICD-10-CM | POA: Diagnosis not present

## 2021-09-12 DIAGNOSIS — D509 Iron deficiency anemia, unspecified: Secondary | ICD-10-CM | POA: Diagnosis not present

## 2021-09-12 DIAGNOSIS — R17 Unspecified jaundice: Secondary | ICD-10-CM | POA: Diagnosis not present

## 2021-09-12 DIAGNOSIS — Z79899 Other long term (current) drug therapy: Secondary | ICD-10-CM | POA: Diagnosis not present

## 2021-09-12 DIAGNOSIS — Z992 Dependence on renal dialysis: Secondary | ICD-10-CM | POA: Diagnosis not present

## 2021-09-12 DIAGNOSIS — N2581 Secondary hyperparathyroidism of renal origin: Secondary | ICD-10-CM | POA: Diagnosis not present

## 2021-09-12 DIAGNOSIS — N186 End stage renal disease: Secondary | ICD-10-CM | POA: Diagnosis not present

## 2021-09-13 DIAGNOSIS — N186 End stage renal disease: Secondary | ICD-10-CM | POA: Diagnosis not present

## 2021-09-13 DIAGNOSIS — N2581 Secondary hyperparathyroidism of renal origin: Secondary | ICD-10-CM | POA: Diagnosis not present

## 2021-09-13 DIAGNOSIS — D631 Anemia in chronic kidney disease: Secondary | ICD-10-CM | POA: Diagnosis not present

## 2021-09-13 DIAGNOSIS — N2589 Other disorders resulting from impaired renal tubular function: Secondary | ICD-10-CM | POA: Diagnosis not present

## 2021-09-13 DIAGNOSIS — R17 Unspecified jaundice: Secondary | ICD-10-CM | POA: Diagnosis not present

## 2021-09-13 DIAGNOSIS — Z79899 Other long term (current) drug therapy: Secondary | ICD-10-CM | POA: Diagnosis not present

## 2021-09-13 DIAGNOSIS — Z992 Dependence on renal dialysis: Secondary | ICD-10-CM | POA: Diagnosis not present

## 2021-09-13 DIAGNOSIS — D509 Iron deficiency anemia, unspecified: Secondary | ICD-10-CM | POA: Diagnosis not present

## 2021-09-13 DIAGNOSIS — E440 Moderate protein-calorie malnutrition: Secondary | ICD-10-CM | POA: Diagnosis not present

## 2021-09-14 DIAGNOSIS — E440 Moderate protein-calorie malnutrition: Secondary | ICD-10-CM | POA: Diagnosis not present

## 2021-09-14 DIAGNOSIS — D509 Iron deficiency anemia, unspecified: Secondary | ICD-10-CM | POA: Diagnosis not present

## 2021-09-14 DIAGNOSIS — N2581 Secondary hyperparathyroidism of renal origin: Secondary | ICD-10-CM | POA: Diagnosis not present

## 2021-09-14 DIAGNOSIS — Z79899 Other long term (current) drug therapy: Secondary | ICD-10-CM | POA: Diagnosis not present

## 2021-09-14 DIAGNOSIS — R17 Unspecified jaundice: Secondary | ICD-10-CM | POA: Diagnosis not present

## 2021-09-14 DIAGNOSIS — N186 End stage renal disease: Secondary | ICD-10-CM | POA: Diagnosis not present

## 2021-09-14 DIAGNOSIS — N2589 Other disorders resulting from impaired renal tubular function: Secondary | ICD-10-CM | POA: Diagnosis not present

## 2021-09-14 DIAGNOSIS — D631 Anemia in chronic kidney disease: Secondary | ICD-10-CM | POA: Diagnosis not present

## 2021-09-14 DIAGNOSIS — Z992 Dependence on renal dialysis: Secondary | ICD-10-CM | POA: Diagnosis not present

## 2021-09-15 DIAGNOSIS — D631 Anemia in chronic kidney disease: Secondary | ICD-10-CM | POA: Diagnosis not present

## 2021-09-15 DIAGNOSIS — N186 End stage renal disease: Secondary | ICD-10-CM | POA: Diagnosis not present

## 2021-09-15 DIAGNOSIS — R17 Unspecified jaundice: Secondary | ICD-10-CM | POA: Diagnosis not present

## 2021-09-15 DIAGNOSIS — N2589 Other disorders resulting from impaired renal tubular function: Secondary | ICD-10-CM | POA: Diagnosis not present

## 2021-09-15 DIAGNOSIS — N2581 Secondary hyperparathyroidism of renal origin: Secondary | ICD-10-CM | POA: Diagnosis not present

## 2021-09-15 DIAGNOSIS — Z79899 Other long term (current) drug therapy: Secondary | ICD-10-CM | POA: Diagnosis not present

## 2021-09-15 DIAGNOSIS — D509 Iron deficiency anemia, unspecified: Secondary | ICD-10-CM | POA: Diagnosis not present

## 2021-09-15 DIAGNOSIS — Z992 Dependence on renal dialysis: Secondary | ICD-10-CM | POA: Diagnosis not present

## 2021-09-15 DIAGNOSIS — E440 Moderate protein-calorie malnutrition: Secondary | ICD-10-CM | POA: Diagnosis not present

## 2021-09-16 DIAGNOSIS — R053 Chronic cough: Secondary | ICD-10-CM | POA: Diagnosis not present

## 2021-09-16 DIAGNOSIS — J353 Hypertrophy of tonsils with hypertrophy of adenoids: Secondary | ICD-10-CM | POA: Diagnosis not present

## 2021-09-16 DIAGNOSIS — D509 Iron deficiency anemia, unspecified: Secondary | ICD-10-CM | POA: Diagnosis not present

## 2021-09-16 DIAGNOSIS — J301 Allergic rhinitis due to pollen: Secondary | ICD-10-CM | POA: Diagnosis not present

## 2021-09-16 DIAGNOSIS — D631 Anemia in chronic kidney disease: Secondary | ICD-10-CM | POA: Diagnosis not present

## 2021-09-16 DIAGNOSIS — N2589 Other disorders resulting from impaired renal tubular function: Secondary | ICD-10-CM | POA: Diagnosis not present

## 2021-09-16 DIAGNOSIS — E440 Moderate protein-calorie malnutrition: Secondary | ICD-10-CM | POA: Diagnosis not present

## 2021-09-16 DIAGNOSIS — Z79899 Other long term (current) drug therapy: Secondary | ICD-10-CM | POA: Diagnosis not present

## 2021-09-16 DIAGNOSIS — N186 End stage renal disease: Secondary | ICD-10-CM | POA: Diagnosis not present

## 2021-09-16 DIAGNOSIS — K219 Gastro-esophageal reflux disease without esophagitis: Secondary | ICD-10-CM | POA: Diagnosis not present

## 2021-09-16 DIAGNOSIS — N2581 Secondary hyperparathyroidism of renal origin: Secondary | ICD-10-CM | POA: Diagnosis not present

## 2021-09-16 DIAGNOSIS — R17 Unspecified jaundice: Secondary | ICD-10-CM | POA: Diagnosis not present

## 2021-09-16 DIAGNOSIS — Z992 Dependence on renal dialysis: Secondary | ICD-10-CM | POA: Diagnosis not present

## 2021-09-17 DIAGNOSIS — R17 Unspecified jaundice: Secondary | ICD-10-CM | POA: Diagnosis not present

## 2021-09-17 DIAGNOSIS — E440 Moderate protein-calorie malnutrition: Secondary | ICD-10-CM | POA: Diagnosis not present

## 2021-09-17 DIAGNOSIS — N2581 Secondary hyperparathyroidism of renal origin: Secondary | ICD-10-CM | POA: Diagnosis not present

## 2021-09-17 DIAGNOSIS — D631 Anemia in chronic kidney disease: Secondary | ICD-10-CM | POA: Diagnosis not present

## 2021-09-17 DIAGNOSIS — N186 End stage renal disease: Secondary | ICD-10-CM | POA: Diagnosis not present

## 2021-09-17 DIAGNOSIS — N2589 Other disorders resulting from impaired renal tubular function: Secondary | ICD-10-CM | POA: Diagnosis not present

## 2021-09-17 DIAGNOSIS — Z79899 Other long term (current) drug therapy: Secondary | ICD-10-CM | POA: Diagnosis not present

## 2021-09-17 DIAGNOSIS — Z992 Dependence on renal dialysis: Secondary | ICD-10-CM | POA: Diagnosis not present

## 2021-09-17 DIAGNOSIS — D509 Iron deficiency anemia, unspecified: Secondary | ICD-10-CM | POA: Diagnosis not present

## 2021-09-18 DIAGNOSIS — R17 Unspecified jaundice: Secondary | ICD-10-CM | POA: Diagnosis not present

## 2021-09-18 DIAGNOSIS — D509 Iron deficiency anemia, unspecified: Secondary | ICD-10-CM | POA: Diagnosis not present

## 2021-09-18 DIAGNOSIS — I120 Hypertensive chronic kidney disease with stage 5 chronic kidney disease or end stage renal disease: Secondary | ICD-10-CM | POA: Diagnosis not present

## 2021-09-18 DIAGNOSIS — Z79899 Other long term (current) drug therapy: Secondary | ICD-10-CM | POA: Diagnosis not present

## 2021-09-18 DIAGNOSIS — J020 Streptococcal pharyngitis: Secondary | ICD-10-CM | POA: Diagnosis not present

## 2021-09-18 DIAGNOSIS — E440 Moderate protein-calorie malnutrition: Secondary | ICD-10-CM | POA: Diagnosis not present

## 2021-09-18 DIAGNOSIS — N2581 Secondary hyperparathyroidism of renal origin: Secondary | ICD-10-CM | POA: Diagnosis not present

## 2021-09-18 DIAGNOSIS — N2589 Other disorders resulting from impaired renal tubular function: Secondary | ICD-10-CM | POA: Diagnosis not present

## 2021-09-18 DIAGNOSIS — D631 Anemia in chronic kidney disease: Secondary | ICD-10-CM | POA: Diagnosis not present

## 2021-09-18 DIAGNOSIS — E1122 Type 2 diabetes mellitus with diabetic chronic kidney disease: Secondary | ICD-10-CM | POA: Diagnosis not present

## 2021-09-18 DIAGNOSIS — N186 End stage renal disease: Secondary | ICD-10-CM | POA: Diagnosis not present

## 2021-09-18 DIAGNOSIS — Z899 Acquired absence of limb, unspecified: Secondary | ICD-10-CM | POA: Diagnosis not present

## 2021-09-18 DIAGNOSIS — Z992 Dependence on renal dialysis: Secondary | ICD-10-CM | POA: Diagnosis not present

## 2021-09-19 DIAGNOSIS — D509 Iron deficiency anemia, unspecified: Secondary | ICD-10-CM | POA: Diagnosis not present

## 2021-09-19 DIAGNOSIS — N2589 Other disorders resulting from impaired renal tubular function: Secondary | ICD-10-CM | POA: Diagnosis not present

## 2021-09-19 DIAGNOSIS — E440 Moderate protein-calorie malnutrition: Secondary | ICD-10-CM | POA: Diagnosis not present

## 2021-09-19 DIAGNOSIS — N2581 Secondary hyperparathyroidism of renal origin: Secondary | ICD-10-CM | POA: Diagnosis not present

## 2021-09-19 DIAGNOSIS — Z992 Dependence on renal dialysis: Secondary | ICD-10-CM | POA: Diagnosis not present

## 2021-09-19 DIAGNOSIS — Z79899 Other long term (current) drug therapy: Secondary | ICD-10-CM | POA: Diagnosis not present

## 2021-09-19 DIAGNOSIS — D631 Anemia in chronic kidney disease: Secondary | ICD-10-CM | POA: Diagnosis not present

## 2021-09-19 DIAGNOSIS — R17 Unspecified jaundice: Secondary | ICD-10-CM | POA: Diagnosis not present

## 2021-09-19 DIAGNOSIS — N186 End stage renal disease: Secondary | ICD-10-CM | POA: Diagnosis not present

## 2021-09-20 DIAGNOSIS — N2589 Other disorders resulting from impaired renal tubular function: Secondary | ICD-10-CM | POA: Diagnosis not present

## 2021-09-20 DIAGNOSIS — Z79899 Other long term (current) drug therapy: Secondary | ICD-10-CM | POA: Diagnosis not present

## 2021-09-20 DIAGNOSIS — N2581 Secondary hyperparathyroidism of renal origin: Secondary | ICD-10-CM | POA: Diagnosis not present

## 2021-09-20 DIAGNOSIS — D631 Anemia in chronic kidney disease: Secondary | ICD-10-CM | POA: Diagnosis not present

## 2021-09-20 DIAGNOSIS — R17 Unspecified jaundice: Secondary | ICD-10-CM | POA: Diagnosis not present

## 2021-09-20 DIAGNOSIS — Z992 Dependence on renal dialysis: Secondary | ICD-10-CM | POA: Diagnosis not present

## 2021-09-20 DIAGNOSIS — D509 Iron deficiency anemia, unspecified: Secondary | ICD-10-CM | POA: Diagnosis not present

## 2021-09-20 DIAGNOSIS — N186 End stage renal disease: Secondary | ICD-10-CM | POA: Diagnosis not present

## 2021-09-20 DIAGNOSIS — E440 Moderate protein-calorie malnutrition: Secondary | ICD-10-CM | POA: Diagnosis not present

## 2021-09-21 DIAGNOSIS — R17 Unspecified jaundice: Secondary | ICD-10-CM | POA: Diagnosis not present

## 2021-09-21 DIAGNOSIS — D631 Anemia in chronic kidney disease: Secondary | ICD-10-CM | POA: Diagnosis not present

## 2021-09-21 DIAGNOSIS — Z992 Dependence on renal dialysis: Secondary | ICD-10-CM | POA: Diagnosis not present

## 2021-09-21 DIAGNOSIS — N2581 Secondary hyperparathyroidism of renal origin: Secondary | ICD-10-CM | POA: Diagnosis not present

## 2021-09-21 DIAGNOSIS — N186 End stage renal disease: Secondary | ICD-10-CM | POA: Diagnosis not present

## 2021-09-21 DIAGNOSIS — E440 Moderate protein-calorie malnutrition: Secondary | ICD-10-CM | POA: Diagnosis not present

## 2021-09-21 DIAGNOSIS — Z79899 Other long term (current) drug therapy: Secondary | ICD-10-CM | POA: Diagnosis not present

## 2021-09-21 DIAGNOSIS — D509 Iron deficiency anemia, unspecified: Secondary | ICD-10-CM | POA: Diagnosis not present

## 2021-09-21 DIAGNOSIS — N2589 Other disorders resulting from impaired renal tubular function: Secondary | ICD-10-CM | POA: Diagnosis not present

## 2021-09-22 ENCOUNTER — Other Ambulatory Visit: Payer: Self-pay

## 2021-09-22 ENCOUNTER — Observation Stay
Admit: 2021-09-22 | Discharge: 2021-09-22 | Disposition: A | Discharge status: Home / Self Care | Payer: Medicare Other | Attending: Obstetrics and Gynecology | Admitting: Obstetrics and Gynecology

## 2021-09-22 ENCOUNTER — Emergency Department: Payer: Medicare Other

## 2021-09-22 ENCOUNTER — Observation Stay: Payer: Medicare Other

## 2021-09-22 ENCOUNTER — Inpatient Hospital Stay
Admission: EM | Admit: 2021-09-22 | Discharge: 2021-10-05 | DRG: 617 | Disposition: A | Discharge status: Home / Self Care | Payer: Medicare Other | Attending: Internal Medicine | Admitting: Internal Medicine

## 2021-09-22 DIAGNOSIS — E1122 Type 2 diabetes mellitus with diabetic chronic kidney disease: Secondary | ICD-10-CM | POA: Diagnosis not present

## 2021-09-22 DIAGNOSIS — M00072 Staphylococcal arthritis, left ankle and foot: Secondary | ICD-10-CM | POA: Diagnosis not present

## 2021-09-22 DIAGNOSIS — I10 Essential (primary) hypertension: Secondary | ICD-10-CM | POA: Diagnosis not present

## 2021-09-22 DIAGNOSIS — L089 Local infection of the skin and subcutaneous tissue, unspecified: Secondary | ICD-10-CM | POA: Diagnosis not present

## 2021-09-22 DIAGNOSIS — M7989 Other specified soft tissue disorders: Secondary | ICD-10-CM | POA: Diagnosis not present

## 2021-09-22 DIAGNOSIS — D631 Anemia in chronic kidney disease: Secondary | ICD-10-CM | POA: Diagnosis not present

## 2021-09-22 DIAGNOSIS — E1142 Type 2 diabetes mellitus with diabetic polyneuropathy: Secondary | ICD-10-CM | POA: Diagnosis not present

## 2021-09-22 DIAGNOSIS — D72829 Elevated white blood cell count, unspecified: Secondary | ICD-10-CM

## 2021-09-22 DIAGNOSIS — Z452 Encounter for adjustment and management of vascular access device: Secondary | ICD-10-CM | POA: Diagnosis not present

## 2021-09-22 DIAGNOSIS — D62 Acute posthemorrhagic anemia: Secondary | ICD-10-CM | POA: Diagnosis present

## 2021-09-22 DIAGNOSIS — E440 Moderate protein-calorie malnutrition: Secondary | ICD-10-CM | POA: Diagnosis not present

## 2021-09-22 DIAGNOSIS — B9562 Methicillin resistant Staphylococcus aureus infection as the cause of diseases classified elsewhere: Secondary | ICD-10-CM | POA: Diagnosis not present

## 2021-09-22 DIAGNOSIS — E10628 Type 1 diabetes mellitus with other skin complications: Secondary | ICD-10-CM | POA: Diagnosis not present

## 2021-09-22 DIAGNOSIS — M869 Osteomyelitis, unspecified: Secondary | ICD-10-CM | POA: Diagnosis not present

## 2021-09-22 DIAGNOSIS — N2589 Other disorders resulting from impaired renal tubular function: Secondary | ICD-10-CM | POA: Diagnosis not present

## 2021-09-22 DIAGNOSIS — R1084 Generalized abdominal pain: Secondary | ICD-10-CM

## 2021-09-22 DIAGNOSIS — R7881 Bacteremia: Secondary | ICD-10-CM | POA: Diagnosis not present

## 2021-09-22 DIAGNOSIS — Z09 Encounter for follow-up examination after completed treatment for conditions other than malignant neoplasm: Secondary | ICD-10-CM

## 2021-09-22 DIAGNOSIS — E876 Hypokalemia: Secondary | ICD-10-CM | POA: Diagnosis present

## 2021-09-22 DIAGNOSIS — E1069 Type 1 diabetes mellitus with other specified complication: Secondary | ICD-10-CM | POA: Diagnosis not present

## 2021-09-22 DIAGNOSIS — R609 Edema, unspecified: Secondary | ICD-10-CM

## 2021-09-22 DIAGNOSIS — Z833 Family history of diabetes mellitus: Secondary | ICD-10-CM | POA: Diagnosis not present

## 2021-09-22 DIAGNOSIS — E109 Type 1 diabetes mellitus without complications: Secondary | ICD-10-CM

## 2021-09-22 DIAGNOSIS — R17 Unspecified jaundice: Secondary | ICD-10-CM | POA: Diagnosis not present

## 2021-09-22 DIAGNOSIS — I96 Gangrene, not elsewhere classified: Secondary | ICD-10-CM | POA: Diagnosis not present

## 2021-09-22 DIAGNOSIS — E1169 Type 2 diabetes mellitus with other specified complication: Secondary | ICD-10-CM | POA: Diagnosis not present

## 2021-09-22 DIAGNOSIS — E1052 Type 1 diabetes mellitus with diabetic peripheral angiopathy with gangrene: Secondary | ICD-10-CM | POA: Diagnosis not present

## 2021-09-22 DIAGNOSIS — E1022 Type 1 diabetes mellitus with diabetic chronic kidney disease: Secondary | ICD-10-CM | POA: Diagnosis not present

## 2021-09-22 DIAGNOSIS — B9561 Methicillin susceptible Staphylococcus aureus infection as the cause of diseases classified elsewhere: Secondary | ICD-10-CM | POA: Diagnosis not present

## 2021-09-22 DIAGNOSIS — I358 Other nonrheumatic aortic valve disorders: Secondary | ICD-10-CM | POA: Diagnosis not present

## 2021-09-22 DIAGNOSIS — I120 Hypertensive chronic kidney disease with stage 5 chronic kidney disease or end stage renal disease: Secondary | ICD-10-CM | POA: Diagnosis present

## 2021-09-22 DIAGNOSIS — B20 Human immunodeficiency virus [HIV] disease: Secondary | ICD-10-CM | POA: Diagnosis present

## 2021-09-22 DIAGNOSIS — R590 Localized enlarged lymph nodes: Secondary | ICD-10-CM | POA: Diagnosis not present

## 2021-09-22 DIAGNOSIS — A419 Sepsis, unspecified organism: Secondary | ICD-10-CM | POA: Diagnosis not present

## 2021-09-22 DIAGNOSIS — Z20822 Contact with and (suspected) exposure to covid-19: Secondary | ICD-10-CM | POA: Diagnosis not present

## 2021-09-22 DIAGNOSIS — N186 End stage renal disease: Secondary | ICD-10-CM | POA: Diagnosis not present

## 2021-09-22 DIAGNOSIS — N19 Unspecified kidney failure: Secondary | ICD-10-CM

## 2021-09-22 DIAGNOSIS — Z992 Dependence on renal dialysis: Secondary | ICD-10-CM | POA: Diagnosis not present

## 2021-09-22 DIAGNOSIS — Z79899 Other long term (current) drug therapy: Secondary | ICD-10-CM | POA: Diagnosis not present

## 2021-09-22 DIAGNOSIS — Z8679 Personal history of other diseases of the circulatory system: Secondary | ICD-10-CM | POA: Diagnosis not present

## 2021-09-22 DIAGNOSIS — L97509 Non-pressure chronic ulcer of other part of unspecified foot with unspecified severity: Secondary | ICD-10-CM | POA: Diagnosis not present

## 2021-09-22 DIAGNOSIS — R011 Cardiac murmur, unspecified: Secondary | ICD-10-CM | POA: Diagnosis not present

## 2021-09-22 DIAGNOSIS — L02612 Cutaneous abscess of left foot: Secondary | ICD-10-CM | POA: Diagnosis not present

## 2021-09-22 DIAGNOSIS — M86172 Other acute osteomyelitis, left ankle and foot: Secondary | ICD-10-CM | POA: Diagnosis not present

## 2021-09-22 DIAGNOSIS — L03116 Cellulitis of left lower limb: Secondary | ICD-10-CM | POA: Diagnosis present

## 2021-09-22 DIAGNOSIS — N2581 Secondary hyperparathyroidism of renal origin: Secondary | ICD-10-CM | POA: Diagnosis not present

## 2021-09-22 DIAGNOSIS — I739 Peripheral vascular disease, unspecified: Secondary | ICD-10-CM

## 2021-09-22 DIAGNOSIS — N3289 Other specified disorders of bladder: Secondary | ICD-10-CM | POA: Diagnosis not present

## 2021-09-22 DIAGNOSIS — M79662 Pain in left lower leg: Secondary | ICD-10-CM | POA: Diagnosis not present

## 2021-09-22 DIAGNOSIS — I517 Cardiomegaly: Secondary | ICD-10-CM | POA: Diagnosis not present

## 2021-09-22 DIAGNOSIS — Z8249 Family history of ischemic heart disease and other diseases of the circulatory system: Secondary | ICD-10-CM

## 2021-09-22 DIAGNOSIS — E1042 Type 1 diabetes mellitus with diabetic polyneuropathy: Secondary | ICD-10-CM | POA: Diagnosis present

## 2021-09-22 DIAGNOSIS — G4733 Obstructive sleep apnea (adult) (pediatric): Secondary | ICD-10-CM | POA: Diagnosis present

## 2021-09-22 DIAGNOSIS — I700 Atherosclerosis of aorta: Secondary | ICD-10-CM | POA: Diagnosis not present

## 2021-09-22 DIAGNOSIS — E739 Lactose intolerance, unspecified: Secondary | ICD-10-CM | POA: Diagnosis present

## 2021-09-22 DIAGNOSIS — Z89422 Acquired absence of other left toe(s): Secondary | ICD-10-CM

## 2021-09-22 DIAGNOSIS — K828 Other specified diseases of gallbladder: Secondary | ICD-10-CM | POA: Diagnosis not present

## 2021-09-22 DIAGNOSIS — D509 Iron deficiency anemia, unspecified: Secondary | ICD-10-CM | POA: Diagnosis not present

## 2021-09-22 DIAGNOSIS — R2242 Localized swelling, mass and lump, left lower limb: Secondary | ICD-10-CM | POA: Diagnosis not present

## 2021-09-22 DIAGNOSIS — M868X9 Other osteomyelitis, unspecified sites: Secondary | ICD-10-CM | POA: Diagnosis not present

## 2021-09-22 DIAGNOSIS — L97522 Non-pressure chronic ulcer of other part of left foot with fat layer exposed: Secondary | ICD-10-CM | POA: Diagnosis not present

## 2021-09-22 DIAGNOSIS — Z8709 Personal history of other diseases of the respiratory system: Secondary | ICD-10-CM | POA: Diagnosis not present

## 2021-09-22 LAB — COMPREHENSIVE METABOLIC PANEL
ALT: 15 U/L (ref 0–44)
AST: 12 U/L — ABNORMAL LOW (ref 15–41)
Albumin: 2.6 g/dL — ABNORMAL LOW (ref 3.5–5.0)
Alkaline Phosphatase: 73 U/L (ref 38–126)
Anion gap: 21 — ABNORMAL HIGH (ref 5–15)
BUN: 113 mg/dL — ABNORMAL HIGH (ref 6–20)
CO2: 20 mmol/L — ABNORMAL LOW (ref 22–32)
Calcium: 9.3 mg/dL (ref 8.9–10.3)
Chloride: 91 mmol/L — ABNORMAL LOW (ref 98–111)
Creatinine, Ser: 23.91 mg/dL — ABNORMAL HIGH (ref 0.61–1.24)
GFR, Estimated: 2 mL/min — ABNORMAL LOW (ref >60)
Glucose, Bld: 122 mg/dL — ABNORMAL HIGH (ref 70–99)
Potassium: 4.4 mmol/L (ref 3.5–5.1)
Sodium: 132 mmol/L — ABNORMAL LOW (ref 135–145)
Total Bilirubin: 1 mg/dL (ref 0.3–1.2)
Total Protein: 8.5 g/dL — ABNORMAL HIGH (ref 6.5–8.1)

## 2021-09-22 LAB — BODY FLUID CELL COUNT WITH DIFFERENTIAL
Eos, Fluid: 0 %
Lymphs, Fluid: 11 %
Monocyte-Macrophage-Serous Fluid: 19 %
Neutrophil Count, Fluid: 70 %
Total Nucleated Cell Count, Fluid: 76 cu mm

## 2021-09-22 LAB — CBC WITH DIFFERENTIAL/PLATELET
Abs Immature Granulocytes: 0.51 10*3/uL — ABNORMAL HIGH (ref 0.00–0.07)
Basophils Absolute: 0.1 10*3/uL (ref 0.0–0.1)
Basophils Relative: 0 %
Eosinophils Absolute: 0.2 10*3/uL (ref 0.0–0.5)
Eosinophils Relative: 1 %
HCT: 26.2 % — ABNORMAL LOW (ref 39.0–52.0)
Hemoglobin: 9.2 g/dL — ABNORMAL LOW (ref 13.0–17.0)
Immature Granulocytes: 2 %
Lymphocytes Relative: 8 %
Lymphs Abs: 2.2 10*3/uL (ref 0.7–4.0)
MCH: 29.8 pg (ref 26.0–34.0)
MCHC: 35.1 g/dL (ref 30.0–36.0)
MCV: 84.8 fL (ref 80.0–100.0)
Monocytes Absolute: 2.2 10*3/uL — ABNORMAL HIGH (ref 0.1–1.0)
Monocytes Relative: 9 %
Neutro Abs: 20.7 10*3/uL — ABNORMAL HIGH (ref 1.7–7.7)
Neutrophils Relative %: 80 %
Platelets: 303 10*3/uL (ref 150–400)
RBC: 3.09 MIL/uL — ABNORMAL LOW (ref 4.22–5.81)
RDW: 14.2 % (ref 11.5–15.5)
Smear Review: NORMAL
WBC: 25.9 10*3/uL — ABNORMAL HIGH (ref 4.0–10.5)
nRBC: 0 % (ref 0.0–0.2)

## 2021-09-22 LAB — CBG MONITORING, ED: Glucose-Capillary: 172 mg/dL — ABNORMAL HIGH (ref 70–99)

## 2021-09-22 LAB — PROCALCITONIN: Procalcitonin: 6.46 ng/mL

## 2021-09-22 LAB — LACTIC ACID, PLASMA: Lactic Acid, Venous: 1.1 mmol/L (ref 0.5–1.9)

## 2021-09-22 LAB — PROTEIN, PLEURAL OR PERITONEAL FLUID: Total protein, fluid: <3 g/dL

## 2021-09-22 LAB — RESP PANEL BY RT-PCR (FLU A&B, COVID) ARPGX2
Influenza A by PCR: NEGATIVE
Influenza B by PCR: NEGATIVE
SARS Coronavirus 2 by RT PCR: NEGATIVE

## 2021-09-22 LAB — TROPONIN I (HIGH SENSITIVITY)
Troponin I (High Sensitivity): 26 ng/L — ABNORMAL HIGH (ref <18)
Troponin I (High Sensitivity): 25 ng/L — ABNORMAL HIGH (ref <18)

## 2021-09-22 LAB — PROTIME-INR
INR: 1.3 — ABNORMAL HIGH (ref 0.8–1.2)
Prothrombin Time: 16.5 seconds — ABNORMAL HIGH (ref 11.4–15.2)

## 2021-09-22 LAB — GROUP A STREP BY PCR: Group A Strep by PCR: NOT DETECTED

## 2021-09-22 LAB — APTT: aPTT: 34 seconds (ref 24–36)

## 2021-09-22 MED ORDER — INSULIN ASPART 100 UNIT/ML IJ SOLN
0.0000 [IU] | Freq: Three times a day (TID) | INTRAMUSCULAR | Status: DC
  Administered 2021-09-25 – 2021-10-05 (×7): 1 [IU] via SUBCUTANEOUS
  Filled 2021-09-22 (×9): qty 1

## 2021-09-22 MED ORDER — LOSARTAN POTASSIUM 50 MG PO TABS
100.0000 mg | ORAL_TABLET | Freq: Every day | ORAL | Status: DC
  Administered 2021-09-23: 100 mg via ORAL
  Filled 2021-09-22: qty 2

## 2021-09-22 MED ORDER — HEPARIN SODIUM (PORCINE) 5000 UNIT/ML IJ SOLN
5000.0000 [IU] | Freq: Three times a day (TID) | INTRAMUSCULAR | Status: DC
  Administered 2021-09-23 – 2021-10-05 (×32): 5000 [IU] via SUBCUTANEOUS
  Filled 2021-09-22 (×32): qty 1

## 2021-09-22 MED ORDER — METRONIDAZOLE 500 MG/100ML IV SOLN
500.0000 mg | Freq: Once | INTRAVENOUS | Status: AC
  Administered 2021-09-22: 500 mg via INTRAVENOUS
  Filled 2021-09-22: qty 100

## 2021-09-22 MED ORDER — DOLUTEGRAVIR SODIUM 50 MG PO TABS
50.0000 mg | ORAL_TABLET | Freq: Two times a day (BID) | ORAL | Status: DC
  Administered 2021-09-22 – 2021-09-23 (×3): 50 mg via ORAL
  Filled 2021-09-22 (×6): qty 1

## 2021-09-22 MED ORDER — LAMIVUDINE 10 MG/ML PO SOLN
25.0000 mg | Freq: Every day | ORAL | Status: DC
  Administered 2021-09-23 – 2021-10-04 (×13): 25 mg via ORAL
  Filled 2021-09-22: qty 5
  Filled 2021-09-22 (×8): qty 2.5
  Filled 2021-09-22 (×3): qty 5
  Filled 2021-09-22 (×4): qty 2.5

## 2021-09-22 MED ORDER — MINOXIDIL 2.5 MG PO TABS
2.5000 mg | ORAL_TABLET | ORAL | Status: DC
  Filled 2021-09-22: qty 1

## 2021-09-22 MED ORDER — AMLODIPINE BESYLATE 5 MG PO TABS
5.0000 mg | ORAL_TABLET | Freq: Every day | ORAL | Status: DC
  Administered 2021-09-23: 5 mg via ORAL
  Filled 2021-09-22: qty 1

## 2021-09-22 MED ORDER — CARVEDILOL 25 MG PO TABS
25.0000 mg | ORAL_TABLET | Freq: Two times a day (BID) | ORAL | Status: DC
  Administered 2021-09-22 – 2021-09-23 (×3): 25 mg via ORAL
  Filled 2021-09-22: qty 1
  Filled 2021-09-22 (×2): qty 4

## 2021-09-22 MED ORDER — SODIUM CHLORIDE 0.9% FLUSH
3.0000 mL | Freq: Two times a day (BID) | INTRAVENOUS | Status: DC
  Administered 2021-09-22 – 2021-10-04 (×24): 3 mL via INTRAVENOUS

## 2021-09-22 MED ORDER — SODIUM CHLORIDE 0.9 % IV SOLN
250.0000 mL | INTRAVENOUS | Status: DC | PRN
  Administered 2021-10-04: 13:00:00 250 mL via INTRAVENOUS

## 2021-09-22 MED ORDER — SODIUM CHLORIDE 0.9% FLUSH: 3.0000 mL | INTRAVENOUS | Status: DC | PRN

## 2021-09-22 MED ORDER — SODIUM CHLORIDE 0.9 % IV SOLN
2.0000 g | Freq: Once | INTRAVENOUS | Status: AC
  Administered 2021-09-22: 2 g via INTRAVENOUS
  Filled 2021-09-22: qty 2

## 2021-09-22 MED ORDER — LACTATED RINGERS IV BOLUS
500.0000 mL | Freq: Once | INTRAVENOUS | Status: AC
  Administered 2021-09-22: 500 mL via INTRAVENOUS

## 2021-09-22 MED ORDER — MINOXIDIL 2.5 MG PO TABS
2.5000 mg | ORAL_TABLET | Freq: Every day | ORAL | Status: DC
  Administered 2021-09-22 – 2021-09-23 (×2): 2.5 mg via ORAL
  Filled 2021-09-22 (×4): qty 1

## 2021-09-22 MED ORDER — ABACAVIR SULFATE 300 MG PO TABS
300.0000 mg | ORAL_TABLET | Freq: Two times a day (BID) | ORAL | Status: DC
  Administered 2021-09-22 – 2021-10-05 (×25): 300 mg via ORAL
  Filled 2021-09-22 (×29): qty 1

## 2021-09-22 MED ORDER — VANCOMYCIN HCL IN DEXTROSE 1-5 GM/200ML-% IV SOLN
1000.0000 mg | Freq: Once | INTRAVENOUS | Status: AC
  Administered 2021-09-22: 1000 mg via INTRAVENOUS
  Filled 2021-09-22: qty 200

## 2021-09-22 MED ORDER — MINOXIDIL 2.5 MG PO TABS: 2.5000 mg | ORAL_TABLET | ORAL | Status: DC

## 2021-09-22 MED ORDER — LACTATED RINGERS IV SOLN: INTRAVENOUS | Status: DC

## 2021-09-22 NOTE — ED Provider Notes (Addendum)
-----------------------------------------   2:49 PM on 09/22/2021 -----------------------------------------  Blood pressure 118/61, pulse 83, temperature 99.7 F (37.6 C), temperature source Oral, resp. rate 17, height 6' 6"  (1.981 m), weight 104.3 kg, SpO2 98 %.  Assuming care from [place].  In short, Nathan Ayers is a 50 y.o. male with a chief complaint of URI .  Refer to the original H&P for additional details.  The current plan of care is to follow-up results of sepsis workup in the setting of fevers at home with cough and shortness of breath.  ----------------------------------------- 4:20 PM on 09/22/2021 ----------------------------------------- Chest x-ray reviewed by me and shows no infiltrate, edema, or effusion.  On reassessment, patient does have diffuse abdominal tenderness and dressing at his PD catheter site appears unkempt.  There is no obvious cellulitis but we will obtain peritoneal fluid to rule out peritonitis.  Labs also remarkable for BUN of 113, unclear if patient is incorrectly administering peritoneal dialysis or if renal function is worsening.  He is receiving broad-spectrum antibiotics given unclear source of sepsis, plan discussed with hospitalist for admission.   Blake Divine, MD 09/22/21 1623  ----------------------------------------- 4:57 PM on 09/22/2021 ----------------------------------------- CT scan of abdomen/pelvis obtained and shows bladder wall thickening concerning for possible UTI, however patient reports that he no longer makes urine.  No other acute findings noted.  Peritoneal fluid studies are pending and patient receiving broad-spectrum antibiotics at this time.    Blake Divine, MD 09/22/21 [id number]

## 2021-09-22 NOTE — ED Triage Notes (Signed)
Pt c/o cough with sore throat , body aches, congestion for the past week, states he was seen at hospital in [place] on Saturday and tested for flu coivd and strep, was positive for strep and given abx that he has been taking, states his sx are not improving.

## 2021-09-22 NOTE — Progress Notes (Signed)
PD sample collected and sent to lab.

## 2021-09-22 NOTE — ED Provider Notes (Signed)
[facility] Emergency Department Provider Note  ____________________________________________   Event Date/Time   First MD Initiated Contact with Patient 09/22/21 1331     (approximate)  I have reviewed the triage vital signs and the nursing notes.   HISTORY  Chief Complaint URI    HPI Nathan Ayers is a 50 y.o. male presents emergency department complaining of feeling like he has pneumonia.  Some chest discomfort.  States he had a fever and chills was seen at a urgent care which they told him he was positive for strep.  They put him on Pen-Vee K.  States he does not feel that he is getting better.  Continues to have chills.  Symptoms since Saturday Patient does PD at home, last dialysis was yesterday  Past Medical History:  Diagnosis Date   Anxiety    Chronic kidney disease (CKD), active medical management without dialysis    Mon, Wed and Fri   Chronic kidney disease on chronic dialysis ([place])    Diabetes mellitus ([place])    Diabetic neuropathy ([place])    Dyspnea    HIV infection ([place])    Hypertension     Patient Active Problem List   Diagnosis Date Noted   ESRD on dialysis ([place]) 01/05/2018   Atherosclerotic peripheral vascular disease with ulceration ([place]) 05/20/2017   Diabetes ([place]) 05/20/2017   Essential hypertension 05/20/2017   HIV disease ([place]) 05/20/2017   Malnutrition of moderate degree 02/01/2017   Sepsis ([place]) 01/31/2017    Past Surgical History:  Procedure Laterality Date   AMPUTATION TOE Left 02/04/2017   Procedure: AMPUTATION 5TH METATARSAL AND JOINT;  Surgeon: Samara Deist, DPM;  Location: [facility] ORS;  Service: Podiatry;  Laterality: Left;   APPLICATION OF WOUND VAC Left 05/20/2017   Procedure: APPLICATION OF WOUND VAC;  Surgeon: Samara Deist, DPM;  Location: [facility] ORS;  Service: Podiatry;  Laterality: Left;   AV FISTULA PLACEMENT     BONE EXCISION Left 02/17/2018   Procedure: BONE EXCISION METATARSAL;  Surgeon: Samara Deist,  DPM;  Location: [facility] ORS;  Service: Podiatry;  Laterality: Left;   INCISION AND DRAINAGE OF WOUND Left 05/20/2017   Procedure: IRRIGATION AND DEBRIDEMENT WOUND;  Surgeon: Samara Deist, DPM;  Location: [facility] ORS;  Service: Podiatry;  Laterality: Left;    Prior to Admission medications   Medication Sig Start Date End Date Taking? Authorizing Provider  abacavir (ZIAGEN) 300 MG tablet Take 300 mg by mouth 2 (two) times daily.  11/12/16   [provider]  Amino Acids (LIQUACEL) LIQD Take by mouth 3 (three) times a week. M-W-F during dialysis    [provider]  Amino Acids-Protein Hydrolys (FEEDING SUPPLEMENT, PRO-STAT SUGAR FREE 64,) LIQD Take 30 mLs by mouth daily. Patient taking differently: Take 30 mLs by mouth every Monday, Wednesday, and Friday.  02/09/17   Epifanio Lesches, MD  aspirin EC 81 MG tablet Take 81 mg by mouth 3 (three) times a week.    [provider]  calcium acetate (PHOSLO) 667 MG capsule Take 1,334 mg by mouth 3 (three) times daily.  11/06/16   [provider]  carvedilol (COREG) 25 MG tablet Take 25 mg by mouth 2 (two) times daily.  01/02/17   [provider]  cefTAZidime 2 g in dextrose 5 % 50 mL Inject 2 g into the vein every Monday, Wednesday, and Friday with hemodialysis. Patient not taking: Reported on 01/05/2018 02/09/17   Epifanio Lesches, MD  insulin aspart (NOVOLOG) 100 UNIT/ML injection Inject  2-18 Units into the skin 3 (three) times daily with meals. ONLY TAKES IF BLOOD SUGAR OVER 200 SLIDING SCALE  201-250 = 2-6 UNITS 251-300= 10 UNITS 301-350=10-12 UNITS 351-400= 15-18 UNITS  OVER 400= 18 UNITS    [provider]  lamiVUDine (EPIVIR) 10 MG/ML solution Take 2.5 mLs by mouth at bedtime.  01/02/17   [provider]  losartan (COZAAR) 100 MG tablet Take 100 mg by mouth daily. 06/28/16 06/28/17  [provider]  losartan (COZAAR) 100 MG tablet Take 100 mg by mouth 1 day or 1 dose. 06/28/16 09/27/18   [provider]  minoxidil (LONITEN) 2.5 MG tablet Take 2.5 mg by mouth See admin instructions. TAKES TWICE DAILY, EXCEPT FOR THE AM DOSE ON DIALYSIS DAYS; MON, WED, FRI 01/23/17   [provider]  mirtazapine (REMERON) 7.5 MG tablet Take 7.5 mg by mouth at bedtime. 05/02/17   [provider]  Nutritional Supplements (FEEDING SUPPLEMENT, NEPRO CARB STEADY,) LIQD Take 237 mLs by mouth as needed (missed meal during dialysis.). 02/08/17   Epifanio Lesches, MD  ondansetron (ZOFRAN-ODT) 4 MG disintegrating tablet Take 4 mg by mouth every 8 (eight) hours as needed for nausea or vomiting.  02/24/16   [provider]  SANTYL ointment Apply 1 application topically daily. APPLIES TO FOOT USING GAUZE THEN WRAPS FOOT 04/26/17   [provider]  TIVICAY 50 MG tablet Take 50 mg by mouth 2 (two) times daily.  01/02/17   [provider]  Turmeric 450 MG CAPS Take 450 mg by mouth 2 (two) times daily as needed (PAIN).    [provider]  vancomycin (VANCOCIN) 1-5 GM/200ML-% SOLN Inject 200 mLs (1,000 mg total) into the vein every Monday, Wednesday, and Friday with hemodialysis. Patient not taking: Reported on 01/05/2018 02/09/17   Epifanio Lesches, MD    Allergies Lac bovis  Family History  Problem Relation Age of Onset   Diabetes Mother    Hypertension Mother     Social History Social History   Tobacco Use   Smoking status: Never   Smokeless tobacco: Never  Vaping Use   Vaping Use: Never used  Substance Use Topics   Alcohol use: No   Drug use: No    Review of Systems  Constitutional: Positive fever/chills Eyes: No visual changes. ENT: No sore throat. Respiratory: Positive cough Cardiovascular: Denies chest pain Gastrointestinal: Denies abdominal pain Genitourinary: Negative for dysuria. Musculoskeletal: Negative for back pain.  Positive for left foot swelling Skin: Negative for rash. Psychiatric: no mood changes,      ____________________________________________   PHYSICAL EXAM:  VITAL SIGNS: ED Triage Vitals  Enc Vitals Group     BP 09/22/21 1122 118/61     Pulse Rate 09/22/21 1122 83     Resp 09/22/21 1122 17     Temp 09/22/21 1122 99.7 F (37.6 C)     Temp Source 09/22/21 1122 Oral     SpO2 09/22/21 1122 98 %     Weight 09/22/21 1123 230 lb (104.3 kg)     Height 09/22/21 1123 6' 6"  (1.981 m)     Head Circumference --      Peak Flow --      Pain Score 09/22/21 1123 9     Pain Loc --      Pain Edu? --      Excl. in [place]? --     Constitutional: Alert and oriented. Well appearing and in no acute distress. Eyes: Conjunctivae are normal.  Head:  Atraumatic. Nose: No congestion/rhinnorhea. Mouth/Throat: Mucous membranes are moist.   Neck:  supple no lymphadenopathy noted Cardiovascular: Normal rate, regular rhythm. Heart sounds are normal Respiratory: Normal respiratory effort.  No retractions, lungs c t a  Abd: soft nontender bs normal all 4 quad GU: deferred Musculoskeletal: Left foot is grossly swollen with some broken skin, Neurologic:  Normal speech and language.  Skin:  Skin is warm, dry, very flaky on the left foot no rash noted. Psychiatric: Mood and affect are normal. Speech and behavior are normal.  ____________________________________________   LABS (all labs ordered are listed, but only abnormal results are displayed)  Labs Reviewed  CBC WITH DIFFERENTIAL/PLATELET - Abnormal; Notable for the following components:      Result Value   WBC 25.9 (*)    RBC 3.09 (*)    Hemoglobin 9.2 (*)    HCT 26.2 (*)    All other components within normal limits  PROTIME-INR - Abnormal; Notable for the following components:   Prothrombin Time 16.5 (*)    INR 1.3 (*)    All other components within normal limits  RESP PANEL BY RT-PCR (FLU A&B, COVID) ARPGX2  GROUP A STREP BY PCR  CULTURE, BLOOD (ROUTINE X 2)  CULTURE, BLOOD (ROUTINE X 2)  LACTIC ACID, PLASMA  APTT   COMPREHENSIVE METABOLIC PANEL  URINALYSIS, COMPLETE (UACMP) WITH MICROSCOPIC  PROCALCITONIN  TROPONIN I (HIGH SENSITIVITY)   ____________________________________________   ____________________________________________  RADIOLOGY  Chest x-ray, x-ray left foot  ____________________________________________   PROCEDURES  Procedure(s) performed: No  Procedures    ____________________________________________   INITIAL IMPRESSION / ASSESSMENT AND PLAN / ED COURSE  Pertinent labs & imaging results that were available during my care of the patient were reviewed by me and considered in my medical decision making (see chart for details).   Patient is a 50 year old male presents emergency department with fever and chills.  See HPI.  Concerns because the patient is a peritoneal dialysis patient who was already on antibiotics and is not feeling better.  Patient self is stated that he is afraid he has pneumonia.  Due to this factor we will start a septic work-up  DDx: Sepsis, PNA, strep throat, osteomyelitis of the left foot  Labs and imaging ordered Dr. Charna Archer in to see  CBC is elevated with a WBC of 25.9  ----------------------------------------- 2:54 PM on 09/22/2021 -----------------------------------------  Discussed with Dr. Charna Archer.  We will start sepsis protocols. Care transferred to Dr. Charna Archer.  Nathan Ayers was evaluated in Emergency Department on 09/22/2021 for the symptoms described in the history of present illness. He was evaluated in the context of the global COVID-19 pandemic, which necessitated consideration that the patient might be at risk for infection with the SARS-CoV-2 virus that causes COVID-19. Institutional protocols and algorithms that pertain to the evaluation of patients at risk for COVID-19 are in a state of rapid change based on information released by regulatory bodies including the CDC and federal and state organizations. These policies and  algorithms were followed during the patient's care in the ED.    As part of my medical decision making, I reviewed the following data within the [place] notes reviewed and incorporated, Labs reviewed , Old chart reviewed, Radiograph reviewed , Evaluated by EM attending , Notes from prior ED visits, and [place] Controlled Substance Database  ____________________________________________   FINAL CLINICAL IMPRESSION(S) / ED DIAGNOSES  Final diagnoses:  Acute sepsis ([place])      NEW MEDICATIONS  STARTED DURING THIS VISIT:  New Prescriptions   No medications on file     Note:  This document was prepared using Dragon voice recognition software and may include unintentional dictation errors.    Versie Starks, PA-C 09/22/21 1455    Blake Divine, MD 09/22/21 1700

## 2021-09-22 NOTE — H&P (Addendum)
History and Physical    Nathan Ayers OFH:[id number] DOB: Sep 06, 1971 DOA: 09/22/2021  PCP: Judeth Cornfield, MD  Patient coming from: home   Chief Complaint: "I feel ill"  HPI: Nathan Ayers is a 50 y.o. male with medical history significant for HIV compliant w/ antiretrovirals, t1dm, ESRD on peritoneal dialysis, OSA not on cpap, and htn, who presents with the above.  Symptoms began about 2 weeks ago. Developed throat pain and went to a local ED/urgent care where was diagnosed with strep throat and given penicillin. Throat pain has resolved, has no pain or difficulty with swallowing. But has developed subjective fever (now resolved) and chills that continue. Also nausea with one episode of vomiting yesterday. Also loose non-bloody stools. Also mild cough and subjective shortness of breath. Does not make urine. No headache or ear ache. Compliant with antiretrovirals. No skin lesions. No abdominal pain  ED Course:   Vanc/cefepime/flagyl, code sepsis, labs, imaging  Review of Systems: As per HPI otherwise 10 point review of systems negative.    Past Medical History:  Diagnosis Date   Anxiety    Chronic kidney disease (CKD), active medical management without dialysis    Mon, Wed and Fri   Chronic kidney disease on chronic dialysis ([place])    Diabetes mellitus ([place])    Diabetic neuropathy ([place])    Dyspnea    HIV infection ([place])    Hypertension     Past Surgical History:  Procedure Laterality Date   AMPUTATION TOE Left 02/04/2017   Procedure: AMPUTATION 5TH METATARSAL AND JOINT;  Surgeon: Samara Deist, DPM;  Location: [facility] ORS;  Service: Podiatry;  Laterality: Left;   APPLICATION OF WOUND VAC Left 05/20/2017   Procedure: APPLICATION OF WOUND VAC;  Surgeon: Samara Deist, DPM;  Location: [facility] ORS;  Service: Podiatry;  Laterality: Left;   AV FISTULA PLACEMENT     BONE EXCISION Left 02/17/2018   Procedure: BONE EXCISION METATARSAL;  Surgeon: Samara Deist, DPM;  Location: [facility]  ORS;  Service: Podiatry;  Laterality: Left;   INCISION AND DRAINAGE OF WOUND Left 05/20/2017   Procedure: IRRIGATION AND DEBRIDEMENT WOUND;  Surgeon: Samara Deist, DPM;  Location: [facility] ORS;  Service: Podiatry;  Laterality: Left;     reports that he has never smoked. He has never used smokeless tobacco. He reports that he does not drink alcohol and does not use drugs.  Allergies  Allergen Reactions   Lac Bovis Other (See Comments)    PLEASE DO NOT GIVE PATIENT MILK PRODUCT. PATIENT IS LACTOSE INTOLERANT.     Family History  Problem Relation Age of Onset   Diabetes Mother    Hypertension Mother     Prior to Admission medications   Medication Sig Start Date End Date Taking? Authorizing Provider  amLODipine (NORVASC) 5 MG tablet Take 5 mg by mouth daily.   Yes [provider]  abacavir (ZIAGEN) 300 MG tablet Take 300 mg by mouth 2 (two) times daily.  11/12/16   [provider]  Amino Acids (LIQUACEL) LIQD Take by mouth 3 (three) times a week. M-W-F during dialysis    [provider]  Amino Acids-Protein Hydrolys (FEEDING SUPPLEMENT, PRO-STAT SUGAR FREE 64,) LIQD Take 30 mLs by mouth daily. Patient taking differently: Take 30 mLs by mouth every Monday, Wednesday, and Friday.  02/09/17   Epifanio Lesches, MD  calcium acetate (PHOSLO) 667 MG capsule Take 1,334 mg by mouth 3 (three) times daily.  11/06/16   [provider]  carvedilol (COREG) 25  MG tablet Take 25 mg by mouth 2 (two) times daily.  01/02/17   [provider]  cefTAZidime 2 g in dextrose 5 % 50 mL Inject 2 g into the vein every Monday, Wednesday, and Friday with hemodialysis. Patient not taking: Reported on 01/05/2018 02/09/17   Epifanio Lesches, MD  lamiVUDine (EPIVIR) 10 MG/ML solution Take 2.5 mLs by mouth at bedtime.  01/02/17   [provider]  losartan (COZAAR) 100 MG tablet Take 100 mg by mouth daily. 06/28/16 06/28/17  [provider]  losartan (COZAAR) 100 MG  tablet Take 100 mg by mouth 1 day or 1 dose. 06/28/16 09/27/18  [provider]  minoxidil (LONITEN) 2.5 MG tablet Take 2.5 mg by mouth See admin instructions. TAKES TWICE DAILY, EXCEPT FOR THE AM DOSE ON DIALYSIS DAYS; MON, WED, FRI 01/23/17   [provider]  Nutritional Supplements (FEEDING SUPPLEMENT, NEPRO CARB STEADY,) LIQD Take 237 mLs by mouth as needed (missed meal during dialysis.). 02/08/17   Epifanio Lesches, MD  ondansetron (ZOFRAN-ODT) 4 MG disintegrating tablet Take 4 mg by mouth every 8 (eight) hours as needed for nausea or vomiting.  02/24/16   [provider]  SANTYL ointment Apply 1 application topically daily. APPLIES TO FOOT USING GAUZE THEN WRAPS FOOT 04/26/17   [provider]  TIVICAY 50 MG tablet Take 50 mg by mouth 2 (two) times daily.  01/02/17   [provider]  Turmeric 450 MG CAPS Take 450 mg by mouth 2 (two) times daily as needed (PAIN).    [provider]  vancomycin (VANCOCIN) 1-5 GM/200ML-% SOLN Inject 200 mLs (1,000 mg total) into the vein every Monday, Wednesday, and Friday with hemodialysis. Patient not taking: Reported on 01/05/2018 02/09/17   Epifanio Lesches, MD    Physical Exam: Vitals:   09/22/21 1122 09/22/21 1123  BP: 118/61   Pulse: 83   Resp: 17   Temp: 99.7 F (37.6 C)   TempSrc: Oral   SpO2: 98%   Weight:  104.3 kg  Height:  6' 6"  (1.981 m)    Constitutional: No acute distress Head: Atraumatic Eyes: Conjunctiva clear ENM: Moist mucous membranes. Normal dentition. Normal pharynx Neck: Supple, no adenopathy Respiratory: Clear to auscultation bilaterally, no wheezing/rales/rhonchi. Normal respiratory effort. No accessory muscle use. . Cardiovascular: Regular rate and rhythm. Harsh holosystolic murmur Abdomen: Non-tender, mildly distended. No masses. No rebound or guarding. Positive bowel sounds. Musculoskeletal: No joint deformity upper and lower extremities. Normal ROM, no contractures.  Normal muscle tone.  Skin: xerosis of lower extremities Extremities: trace LE peripheral edema. Palpable peripheral pulses. Neurologic: Alert, moving all 4 extremities. Psychiatric: Normal insight and judgement.   Labs on Admission: I have personally reviewed following labs and imaging studies  CBC: Recent Labs  Lab 09/22/21 1332  WBC 25.9*  NEUTROABS 20.7*  HGB 9.2*  HCT 26.2*  MCV 84.8  PLT 841   Basic Metabolic Panel: Recent Labs  Lab 09/22/21 1332  NA 132*  K 4.4  CL 91*  CO2 20*  GLUCOSE 122*  BUN 113*  CREATININE 23.91*  CALCIUM 9.3   GFR: Estimated Creatinine Clearance: 4.9 mL/min (A) (by C-G formula based on SCr of 23.91 mg/dL (H)). Liver Function Tests: Recent Labs  Lab 09/22/21 1332  AST 12*  ALT 15  ALKPHOS 73  BILITOT 1.0  PROT 8.5*  ALBUMIN 2.6*   No results for input(s): LIPASE, AMYLASE in the last 168 hours. No results for input(s): AMMONIA in the last 168 hours. Coagulation  Profile: Recent Labs  Lab 09/22/21 1332  INR 1.3*   Cardiac Enzymes: No results for input(s): CKTOTAL, CKMB, CKMBINDEX, TROPONINI in the last 168 hours. BNP (last 3 results) No results for input(s): PROBNP in the last 8760 hours. HbA1C: No results for input(s): HGBA1C in the last 72 hours. CBG: No results for input(s): GLUCAP in the last 168 hours. Lipid Profile: No results for input(s): CHOL, HDL, LDLCALC, TRIG, CHOLHDL, LDLDIRECT in the last 72 hours. Thyroid Function Tests: No results for input(s): TSH, T4TOTAL, FREET4, T3FREE, THYROIDAB in the last 72 hours. Anemia Panel: No results for input(s): VITAMINB12, FOLATE, FERRITIN, TIBC, IRON, RETICCTPCT in the last 72 hours. Urine analysis:    Component Value Date/Time   COLORURINE YELLOW 07/19/2013 1632   APPEARANCEUR CLEAR 07/19/2013 1632   LABSPEC 1.026 07/19/2013 1632   PHURINE 5.5 07/19/2013 1632   GLUCOSEU > 1000 (A) 07/19/2013 1632   HGBUR MOD (A) 07/19/2013 1632   BILIRUBINUR NEG 07/19/2013 1632    KETONESUR NEG 07/19/2013 1632   PROTEINUR 100 (A) 07/19/2013 1632   UROBILINOGEN 1 07/19/2013 1632   NITRITE NEG 07/19/2013 1632   LEUKOCYTESUR NEG 07/19/2013 1632    Radiological Exams on Admission: DG Chest Port 1 View  Result Date: 09/22/2021 CLINICAL DATA:  Questionable sepsis - evaluate for abnormality EXAM: PORTABLE CHEST 1 VIEW COMPARISON:  January 31, 2017. FINDINGS: Borderline enlargement of the cardiac silhouette, likely accentuated by AP technique. Both lungs are clear. No visible pleural effusions or pneumothorax. No acute osseous abnormality. IMPRESSION: No evidence of acute cardiopulmonary disease. Electronically Signed   By: Margaretha Sheffield M.D.   On: 09/22/2021 13:49   DG Foot Complete Left  Result Date: 09/22/2021 CLINICAL DATA:  Swelling, concern for osteomyelitis EXAM: LEFT FOOT - COMPLETE 3+ VIEW COMPARISON:  Left foot MRI 02/01/2017, left foot radiograph 01/31/2017 FINDINGS: The patient is status post amputation of most of the fifth toe metatarsal as well as the fifth toe. The remaining portion of the metatarsal is normal in appearance. There is no osseous destruction or erosion in the foot. There is no acute fracture or dislocation. Alignment is normal. The joint spaces are preserved. Vascular calcifications are noted. There is diffuse soft tissue swelling but no soft tissue gas. IMPRESSION: Postsurgical changes in the foot as above. There is no osseous erosion or destruction identified. There is diffuse soft tissue swelling without soft tissue gas. If there is persistent clinical concern for osteomyelitis, MRI may be considered. Electronically Signed   By: Valetta Mole M.D.   On: 09/22/2021 13:49    EKG: pending  Assessment/Plan Principal Problem:   Leukocytosis Active Problems:   Malnutrition of moderate degree   Type 1 diabetes (HCC)   Essential hypertension   HIV disease ([place])   ESRD on dialysis ([facility])   OSA (obstructive sleep apnea)   # Leukocytosis 2 weeks  of symptoms. Initially pharyngitis that has resolved. Now with chills, cough, body aches. Suspect viral process. WBC markedly elevated. Is on peritoneal dialysis but no peritoneal signs on exam. Doesn't make urine. CXR clear. Pharyngitis resolved (care everywhere does show positive GAS swab from 11/11). Says compliant w/ antiretrovirals so lower concern for opportunistic infections. Covid and flu negative. Received vanc/cefepime/flagyl - f/u gi pathogen panel, c diff. Did have recent abx - f/u respiratory panel - f/u blood cultures - f/u peritoneal fluid analysis and culture - f/u CT abdomen and chest - f/u GC swab throat - may need eval for opportunistic infections if cd4 low -  holding on further abx for now  # Harsh systolic murmur - TTE  # ESRD On peritoneal dialysis says is compliant w/ that - nephrology to see  # OSA Not on cpap at home  # T1DM Says not on insulin since dialysis started - SSI very sensitive  # HIV Says compliant w/ meds. Followed by [facility]. Viral load undetectable in September of this year. Cd4 457 in march of this year - continue ziagen, tivicay, epivir - f/u cd4  DVT prophylaxis: heparin Code Status: full  Family Communication: father updated @ bedside. He cites mother as Network engineer in case of incapacity  Consults called: none   Level of care: Med-Surg Status is: Observation  The patient remains OBS appropriate and will d/c before 2 midnights.       Desma Maxim MD [facility] Pager [phone]  If 7PM-7AM, please contact night-coverage [email or web address] Password [email or web address]  09/22/2021, 5:06 PM

## 2021-09-22 NOTE — ED Notes (Signed)
Dialysis RN in to collect fluid for lab.

## 2021-09-22 NOTE — Progress Notes (Signed)
PHARMACY -  BRIEF ANTIBIOTIC NOTE   Pharmacy has received consult(s) for vancomycin and cefepime from an ED provider.  The patient's profile has been reviewed for ht/wt/allergies/indication/available labs.    One time order(s) placed for: Cefepime 2 g IV Vancomycin 1 g IV  Further antibiotics/pharmacy consults should be ordered by admitting physician if indicated.                       Thank you, Forde Dandy Hugh Kamara 09/22/2021  2:53 PM

## 2021-09-22 NOTE — Progress Notes (Signed)
CODE SEPSIS - PHARMACY COMMUNICATION  **Broad Spectrum Antibiotics should be administered within 1 hour of Sepsis diagnosis**  Time Code Sepsis Called/Page Received: 1447  Antibiotics Ordered: vancomycin, metronidazole and cefepime  Time of 1st antibiotic administration: [place] ,PharmD Clinical Pharmacist  09/22/2021  2:49 PM

## 2021-09-22 NOTE — Progress Notes (Signed)
Elink following code sepsis °

## 2021-09-22 NOTE — ED Provider Notes (Signed)
Emergency Medicine Provider Triage Evaluation Note  Nathan Ayers , a 50 y.o. male  was evaluated in triage.  Pt complains of sore throat. Diagnosed with Strep on Saturday and has taken antibiotics as prescribed. No improvement. .  Review of Systems  Positive: Sore throat, body aches, congestion Negative: Fever  Physical Exam  BP 118/61 (BP Location: Left Arm)   Pulse 83   Temp 99.7 F (37.6 C) (Oral)   Resp 17   Ht 6' 6"  (1.981 m)   Wt 104.3 kg   SpO2 98%   BMI 26.58 kg/m  Gen:   Awake, no distress   Resp:  Normal effort  MSK:   Moves extremities without difficulty  Other:    Medical Decision Making  Medically screening exam initiated at 11:24 AM.  Appropriate orders placed.  Nathan Ayers was informed that the remainder of the evaluation will be completed by another provider, this initial triage assessment does not replace that evaluation, and the importance of remaining in the ED until their evaluation is complete.   Victorino Dike, FNP 09/22/21 1126    Lucrezia Starch, MD 09/22/21 1312

## 2021-09-23 DIAGNOSIS — Z992 Dependence on renal dialysis: Secondary | ICD-10-CM | POA: Diagnosis not present

## 2021-09-23 DIAGNOSIS — Z79899 Other long term (current) drug therapy: Secondary | ICD-10-CM | POA: Diagnosis not present

## 2021-09-23 DIAGNOSIS — D631 Anemia in chronic kidney disease: Secondary | ICD-10-CM | POA: Diagnosis not present

## 2021-09-23 DIAGNOSIS — N2581 Secondary hyperparathyroidism of renal origin: Secondary | ICD-10-CM | POA: Diagnosis not present

## 2021-09-23 DIAGNOSIS — R7881 Bacteremia: Secondary | ICD-10-CM

## 2021-09-23 DIAGNOSIS — N2589 Other disorders resulting from impaired renal tubular function: Secondary | ICD-10-CM | POA: Diagnosis not present

## 2021-09-23 DIAGNOSIS — B9562 Methicillin resistant Staphylococcus aureus infection as the cause of diseases classified elsewhere: Secondary | ICD-10-CM

## 2021-09-23 DIAGNOSIS — B20 Human immunodeficiency virus [HIV] disease: Secondary | ICD-10-CM

## 2021-09-23 DIAGNOSIS — R17 Unspecified jaundice: Secondary | ICD-10-CM | POA: Diagnosis not present

## 2021-09-23 DIAGNOSIS — E440 Moderate protein-calorie malnutrition: Secondary | ICD-10-CM | POA: Diagnosis not present

## 2021-09-23 DIAGNOSIS — N186 End stage renal disease: Secondary | ICD-10-CM | POA: Diagnosis not present

## 2021-09-23 DIAGNOSIS — D509 Iron deficiency anemia, unspecified: Secondary | ICD-10-CM | POA: Diagnosis not present

## 2021-09-23 LAB — ECHOCARDIOGRAM COMPLETE
AR max vel: 2.62 cm2
AV Area VTI: 3.31 cm2
AV Area mean vel: 2.77 cm2
AV Mean grad: 7 mmHg
AV Peak grad: 11.6 mmHg
Ao pk vel: 1.7 m/s
Area-P 1/2: 2.6 cm2
Height: 78 in
S' Lateral: 2.5 cm
Weight: 3680 oz

## 2021-09-23 LAB — CBC
HCT: 23.9 % — ABNORMAL LOW (ref 39.0–52.0)
Hemoglobin: 8.3 g/dL — ABNORMAL LOW (ref 13.0–17.0)
MCH: 29.3 pg (ref 26.0–34.0)
MCHC: 34.7 g/dL (ref 30.0–36.0)
MCV: 84.5 fL (ref 80.0–100.0)
Platelets: 334 10*3/uL (ref 150–400)
RBC: 2.83 MIL/uL — ABNORMAL LOW (ref 4.22–5.81)
RDW: 13.9 % (ref 11.5–15.5)
WBC: 29.9 10*3/uL — ABNORMAL HIGH (ref 4.0–10.5)
nRBC: 0 % (ref 0.0–0.2)

## 2021-09-23 LAB — GASTROINTESTINAL PANEL BY PCR, STOOL (REPLACES STOOL CULTURE)

## 2021-09-23 LAB — BLOOD CULTURE ID PANEL (REFLEXED) - BCID2

## 2021-09-23 LAB — COMPREHENSIVE METABOLIC PANEL
ALT: 14 U/L (ref 0–44)
AST: 14 U/L — ABNORMAL LOW (ref 15–41)
Albumin: 2.4 g/dL — ABNORMAL LOW (ref 3.5–5.0)
Alkaline Phosphatase: 73 U/L (ref 38–126)
Anion gap: 20 — ABNORMAL HIGH (ref 5–15)
BUN: 106 mg/dL — ABNORMAL HIGH (ref 6–20)
CO2: 22 mmol/L (ref 22–32)
Calcium: 9.3 mg/dL (ref 8.9–10.3)
Chloride: 92 mmol/L — ABNORMAL LOW (ref 98–111)
Creatinine, Ser: 24.45 mg/dL — ABNORMAL HIGH (ref 0.61–1.24)
GFR, Estimated: 2 mL/min — ABNORMAL LOW (ref >60)
Glucose, Bld: 147 mg/dL — ABNORMAL HIGH (ref 70–99)
Potassium: 4.5 mmol/L (ref 3.5–5.1)
Sodium: 134 mmol/L — ABNORMAL LOW (ref 135–145)
Total Bilirubin: 0.7 mg/dL (ref 0.3–1.2)
Total Protein: 7.9 g/dL (ref 6.5–8.1)

## 2021-09-23 LAB — RESPIRATORY PANEL BY PCR

## 2021-09-23 LAB — C DIFFICILE QUICK SCREEN W PCR REFLEX
C Diff antigen: NEGATIVE
C Diff interpretation: NOT DETECTED
C Diff toxin: NEGATIVE

## 2021-09-23 LAB — PATHOLOGIST SMEAR REVIEW

## 2021-09-23 LAB — GLUCOSE, CAPILLARY
Glucose-Capillary: 101 mg/dL — ABNORMAL HIGH (ref 70–99)
Glucose-Capillary: 181 mg/dL — ABNORMAL HIGH (ref 70–99)

## 2021-09-23 LAB — CBG MONITORING, ED
Glucose-Capillary: 126 mg/dL — ABNORMAL HIGH (ref 70–99)
Glucose-Capillary: 96 mg/dL (ref 70–99)

## 2021-09-23 LAB — PROCALCITONIN: Procalcitonin: 7.9 ng/mL

## 2021-09-23 MED ORDER — DOLUTEGRAVIR SODIUM 50 MG PO TABS
50.0000 mg | ORAL_TABLET | Freq: Every day | ORAL | Status: DC
  Administered 2021-09-24 – 2021-10-04 (×11): 50 mg via ORAL
  Filled 2021-09-23 (×12): qty 1

## 2021-09-23 MED ORDER — LOPERAMIDE HCL 2 MG PO CAPS
4.0000 mg | ORAL_CAPSULE | Freq: Once | ORAL | Status: AC | PRN
  Administered 2021-09-23: 4 mg via ORAL
  Filled 2021-09-23: qty 2

## 2021-09-23 MED ORDER — VANCOMYCIN HCL 1500 MG/300ML IV SOLN
1500.0000 mg | Freq: Once | INTRAVENOUS | Status: AC
  Administered 2021-09-23: 1500 mg via INTRAVENOUS
  Filled 2021-09-23: qty 300

## 2021-09-23 MED ORDER — DELFLEX-LC/2.5% DEXTROSE 394 MOSM/L IP SOLN
INTRAPERITONEAL | Status: DC
  Filled 2021-09-23 (×2): qty 3000

## 2021-09-23 MED ORDER — GENTAMICIN SULFATE 0.1 % EX CREA
1.0000 "application " | TOPICAL_CREAM | Freq: Every day | CUTANEOUS | Status: DC
  Administered 2021-09-24 – 2021-10-04 (×8): 1 via TOPICAL
  Filled 2021-09-23 (×3): qty 15

## 2021-09-23 MED ORDER — VANCOMYCIN VARIABLE DOSE PER UNSTABLE RENAL FUNCTION (PHARMACIST DOSING): Status: DC

## 2021-09-23 NOTE — Progress Notes (Signed)
Pharmacy Antibiotic Note  Nathan Ayers is a 50 y.o. male admitted on 09/22/2021 with MRSA bacteremia.  Pharmacy has been consulted for vancomycin dosing. PMH includes ESRD on CCPD 7d/wk, HIV.   Recent URI 11/11 treated with PCN. Now with positive nausea, vomiting, fever, and positive blood cx growing MRSA. Source unclear though investigating opportunistic infection (CD4 pending) vs viral infection. Received vanc/cefepime/flagyl x 1 in ED.   Given vancomycin 1,000 mg 11/15 in PM. Estimated recommended loading dose 2000-2500 mg this PD patient.  Plan: Give vancomycin 1500 mg now.  Check vanc random level Friday 11/18 with morning labs. Per nephrology, plan is to restart PD once patient transfers from ED to the floor.  Follow nephrology plans and clinical course.  Height: 6' 6"  (198.1 cm) Weight: 104.3 kg (230 lb) IBW/kg (Calculated) : 91.4  Temp (24hrs), Avg:99.7 F (37.6 C), Min:99.7 F (37.6 C), Max:99.7 F (37.6 C)  Recent Labs  Lab 09/22/21 1332 09/23/21 0445  WBC 25.9* 29.9*  CREATININE 23.91* 24.45*  LATICACIDVEN 1.1  --     Estimated Creatinine Clearance: 4.8 mL/min (A) (by C-G formula based on SCr of 24.45 mg/dL (H)).    Allergies  Allergen Reactions   Lac Bovis Other (See Comments)    PLEASE DO NOT GIVE PATIENT MILK PRODUCT. PATIENT IS LACTOSE INTOLERANT.     Antimicrobials this admission: 11/15 vanc >>  11/15 cefepime >> 11/15 11/15 metronidazole >> 11/15  Dose adjustments this admission: N/a   Microbiology results: 11/15 BCx: GPC 2/4 (one set only), BCID MRSA 11/15 Peritoneal fluid: few WBC, no organisms 11/16 C. Diff toxin: negative 11/15 Group A strep PCR: not detected 11/16 CD4: ip   Thank you for allowing pharmacy to be a part of this patient's care.   Wynelle Cleveland, PharmD Pharmacy Resident  09/23/2021 9:03 AM

## 2021-09-23 NOTE — Progress Notes (Signed)
PROGRESS NOTE    Nathan Ayers  ZOX:[id number] DOB: 04-20-1971 DOA: 09/22/2021 PCP: Judeth Cornfield, MD   Chief Complaint  Patient presents with   URI    Brief Narrative:   Nathan Ayers is a 50 y.o. male with medical history significant for HIV compliant w/ antiretrovirals, t1dm, ESRD on peritoneal dialysis, OSA not on cpap, and htn, who presents with the above.   Symptoms began about 2 weeks ago. Developed throat pain and went to a local ED/urgent care where was diagnosed with strep throat and given penicillin. Throat pain has resolved, has no pain or difficulty with swallowing. But has developed subjective fever (now resolved) and chills that continue. Also nausea with one episode of vomiting yesterday. Also loose non-bloody stools. Also mild cough and subjective shortness of breath. Does not make urine. No headache or ear ache. Compliant with antiretrovirals. No skin lesions. No abdominal pain   Assessment & Plan:   Principal Problem:   Leukocytosis Active Problems:   Malnutrition of moderate degree   Type 1 diabetes (HCC)   Essential hypertension   HIV disease (HCC)   ESRD on dialysis (HCC)   OSA (obstructive sleep apnea)  Bacteremia -Patient with leukocytosis, feeling ill, chills, generalized body ache. -2/4 blood culture growing gram-positive cocci -BCID significant for staph aureus -Continue with IV Zosyn -Follow on peritoneal fluid culture and Gram stain, so far no growth -Follow on 2D echo. -ID consulted  Harsh systolic murmur - follow on TTE   ESRD - On peritoneal dialysis says is compliant w/ that - nephrology consulted   OSA Not on cpap at home   T1DM Says not on insulin since dialysis started - SSI very sensitive    HIV Says compliant w/ meds. Followed by [facility]. Viral load undetectable in September of this year. Cd4 457 in march of this year - continue ziagen, tivicay, epivir - f/u cd4   DVT prophylaxis: Heparin Code Status:  Full Family Communication: father at bedside Disposition:   Status is: inpatient  The patient will require care spanning > 2 midnights and should be moved to inpatient because: Bacteremia and need of IV antibiotics      Consultants:  Renal ID   Subjective:  Report generalized weakness, fatigue, chills  Objective: Vitals:   09/23/21 0328 09/23/21 0642 09/23/21 1256 09/23/21 1500  BP: (!) 118/51 (!) 106/49 (!) 110/55 140/71  Pulse: 88 85  81  Resp: 17 17 18    Temp:   99.2 F (37.3 C) 98.4 F (36.9 C)  TempSrc:   Oral Oral  SpO2: 98% 99% 98% 98%  Weight:      Height:        Intake/Output Summary (Last 24 hours) at 09/23/2021 1553 Last data filed at 09/22/2021 1919 Gross per 24 hour  Intake 900.09 ml  Output --  Net 900.09 ml   Filed Weights   09/22/21 1123  Weight: 104.3 kg    Examination:  Awake Alert, Oriented X 3, No new F.N deficits, Normal affect Symmetrical Chest wall movement, Good air movement bilaterally, CTAB RRR,No Gallops,Rubs , + Murmurs, No Parasternal Heave +ve B.Sounds, Abd Soft, No tenderness, No rebound - guarding or rigidity. No Cyanosis, Clubbing or edema, No new Rash or bruise       Data Reviewed: I have personally reviewed following labs and imaging studies  CBC: Recent Labs  Lab 09/22/21 1332 09/23/21 0445  WBC 25.9* 29.9*  NEUTROABS 20.7*  --   HGB 9.2* 8.3*  HCT 26.2* 23.9*  MCV 84.8 84.5  PLT 303 016    Basic Metabolic Panel: Recent Labs  Lab 09/22/21 1332 09/23/21 0445  NA 132* 134*  K 4.4 4.5  CL 91* 92*  CO2 20* 22  GLUCOSE 122* 147*  BUN 113* 106*  CREATININE 23.91* 24.45*  CALCIUM 9.3 9.3    GFR: Estimated Creatinine Clearance: 4.8 mL/min (A) (by C-G formula based on SCr of 24.45 mg/dL (H)).  Liver Function Tests: Recent Labs  Lab 09/22/21 1332 09/23/21 0445  AST 12* 14*  ALT 15 14  ALKPHOS 73 73  BILITOT 1.0 0.7  PROT 8.5* 7.9  ALBUMIN 2.6* 2.4*    CBG: Recent Labs  Lab  09/22/21 2221 09/23/21 0833 09/23/21 1142  GLUCAP 172* 126* 96     Recent Results (from the past 240 hour(s))  Resp Panel by RT-PCR (Flu A&B, Covid) Nasopharyngeal Swab     Status: None   Collection Time: 09/22/21 11:30 AM   Specimen: Nasopharyngeal Swab; Nasopharyngeal(NP) swabs in vial transport medium  Result Value Ref Range Status   SARS Coronavirus 2 by RT PCR NEGATIVE NEGATIVE Final    Comment: (NOTE) SARS-CoV-2 target nucleic acids are NOT DETECTED.  The SARS-CoV-2 RNA is generally detectable in upper respiratory specimens during the acute phase of infection. The lowest concentration of SARS-CoV-2 viral copies this assay can detect is 138 copies/mL. A negative result does not preclude SARS-Cov-2 infection and should not be used as the sole basis for treatment or other patient management decisions. A negative result may occur with  improper specimen collection/handling, submission of specimen other than nasopharyngeal swab, presence of viral mutation(s) within the areas targeted by this assay, and inadequate number of viral copies(<138 copies/mL). A negative result must be combined with clinical observations, patient history, and epidemiological information. The expected result is Negative.  Fact Sheet for Patients:  EntrepreneurPulse.com.au  Fact Sheet for Healthcare Providers:  IncredibleEmployment.be  This test is no t yet approved or cleared by the [place] FDA and  has been authorized for detection and/or diagnosis of SARS-CoV-2 by FDA under an Emergency Use Authorization (EUA). This EUA will remain  in effect (meaning this test can be used) for the duration of the COVID-19 declaration under Section 564(b)(1) of the Act, 21 U.S.C.section 360bbb-3(b)(1), unless the authorization is terminated  or revoked sooner.       Influenza A by PCR NEGATIVE NEGATIVE Final   Influenza B by PCR NEGATIVE NEGATIVE Final    Comment:  (NOTE) The Xpert Xpress SARS-CoV-2/FLU/RSV plus assay is intended as an aid in the diagnosis of influenza from Nasopharyngeal swab specimens and should not be used as a sole basis for treatment. Nasal washings and aspirates are unacceptable for Xpert Xpress SARS-CoV-2/FLU/RSV testing.  Fact Sheet for Patients: EntrepreneurPulse.com.au  Fact Sheet for Healthcare Providers: IncredibleEmployment.be  This test is not yet approved or cleared by the [place] FDA and has been authorized for detection and/or diagnosis of SARS-CoV-2 by FDA under an Emergency Use Authorization (EUA). This EUA will remain in effect (meaning this test can be used) for the duration of the COVID-19 declaration under Section 564(b)(1) of the Act, 21 U.S.C. section 360bbb-3(b)(1), unless the authorization is terminated or revoked.  Performed at [facility], [place], [place] [address]   Respiratory (~20 pathogens) panel by PCR     Status: None   Collection Time: 09/22/21 11:30 AM   Specimen: Peritoneal Washings; Respiratory  Result Value Ref Range Status  Adenovirus NOT DETECTED NOT DETECTED Final   Coronavirus 229E NOT DETECTED NOT DETECTED Final    Comment: (NOTE) The Coronavirus on the Respiratory Panel, DOES NOT test for the novel  Coronavirus (2019 nCoV)    Coronavirus HKU1 NOT DETECTED NOT DETECTED Final   Coronavirus NL63 NOT DETECTED NOT DETECTED Final   Coronavirus OC43 NOT DETECTED NOT DETECTED Final   Metapneumovirus NOT DETECTED NOT DETECTED Final   Rhinovirus / Enterovirus NOT DETECTED NOT DETECTED Final   Influenza A NOT DETECTED NOT DETECTED Final   Influenza B NOT DETECTED NOT DETECTED Final   Parainfluenza Virus 1 NOT DETECTED NOT DETECTED Final   Parainfluenza Virus 2 NOT DETECTED NOT DETECTED Final   Parainfluenza Virus 3 NOT DETECTED NOT DETECTED Final   Parainfluenza Virus 4 NOT DETECTED NOT DETECTED Final    Respiratory Syncytial Virus NOT DETECTED NOT DETECTED Final   Bordetella pertussis NOT DETECTED NOT DETECTED Final   Bordetella Parapertussis NOT DETECTED NOT DETECTED Final   Chlamydophila pneumoniae NOT DETECTED NOT DETECTED Final   Mycoplasma pneumoniae NOT DETECTED NOT DETECTED Final    Comment: Performed at [facility], [place] [address], [place], [place] [address]  Blood Culture (routine x 2)     Status: None (Preliminary result)   Collection Time: 09/22/21  1:32 PM   Specimen: BLOOD  Result Value Ref Range Status   Specimen Description   Final    BLOOD LEFT ANTECUBITAL Performed at [facility], [address] [place], [place] [address]    Special Requests   Final    BOTTLES DRAWN AEROBIC AND ANAEROBIC Blood Culture results may not be optimal due to an excessive volume of blood received in culture bottles Performed at [facility], [address] [place], [place] [address]    Culture  Setup Time AEROBIC BOTTLE ONLY  Final   Culture   Final    NO GROWTH < 24 HOURS Performed at [facility], [address] [place], [place] [address]    Report Status PENDING  Incomplete  Blood Culture (routine x 2)     Status: None (Preliminary result)   Collection Time: 09/22/21  1:32 PM   Specimen: BLOOD  Result Value Ref Range Status   Specimen Description BLOOD LEFT ANTECUBITAL  Final   Special Requests   Final    BOTTLES DRAWN AEROBIC AND ANAEROBIC Blood Culture adequate volume   Culture  Setup Time   Final    GRAM POSITIVE COCCI IN BOTH AEROBIC AND ANAEROBIC BOTTLES Organism ID to follow CRITICAL RESULT CALLED TO, READ BACK BY AND VERIFIED WITH: Ardeen Garland, PHARMD AT [place] ON 09/23/21 BY GM Performed at [facility], [address] [place], [place] [address]    Culture GRAM POSITIVE COCCI  Final   Report Status PENDING  Incomplete  Blood Culture ID Panel (Reflexed)     Status: Abnormal   Collection Time: 09/22/21  1:32 PM  Result  Value Ref Range Status   Enterococcus faecalis NOT DETECTED NOT DETECTED Final   Enterococcus Faecium NOT DETECTED NOT DETECTED Final   Listeria monocytogenes NOT DETECTED NOT DETECTED Final   Staphylococcus species DETECTED (A) NOT DETECTED Final    Comment: CRITICAL RESULT CALLED TO, READ BACK BY AND VERIFIED WITH: MORGAN HICKS, PHARMD AT 0725 ON 09/23/21 BY GM    Staphylococcus aureus (BCID) DETECTED (A) NOT DETECTED Final    Comment: Methicillin (oxacillin)-resistant Staphylococcus aureus (MRSA). MRSA is predictably resistant to beta-lactam antibiotics (except ceftaroline).  Preferred therapy is vancomycin unless clinically contraindicated. Patient requires contact precautions if  hospitalized. CRITICAL RESULT CALLED TO, READ BACK BY AND VERIFIED WITH: MORGAN HICKS, PHARMD AT 0725 ON 09/23/21 BY GM    Staphylococcus epidermidis NOT DETECTED NOT DETECTED Final   Staphylococcus lugdunensis NOT DETECTED NOT DETECTED Final   Streptococcus species NOT DETECTED NOT DETECTED Final   Streptococcus agalactiae NOT DETECTED NOT DETECTED Final   Streptococcus pneumoniae NOT DETECTED NOT DETECTED Final   Streptococcus pyogenes NOT DETECTED NOT DETECTED Final   A.calcoaceticus-baumannii NOT DETECTED NOT DETECTED Final   Bacteroides fragilis NOT DETECTED NOT DETECTED Final   Enterobacterales NOT DETECTED NOT DETECTED Final   Enterobacter cloacae complex NOT DETECTED NOT DETECTED Final   Escherichia coli NOT DETECTED NOT DETECTED Final   Klebsiella aerogenes NOT DETECTED NOT DETECTED Final   Klebsiella oxytoca NOT DETECTED NOT DETECTED Final   Klebsiella pneumoniae NOT DETECTED NOT DETECTED Final   Proteus species NOT DETECTED NOT DETECTED Final   Salmonella species NOT DETECTED NOT DETECTED Final   Serratia marcescens NOT DETECTED NOT DETECTED Final   Haemophilus influenzae NOT DETECTED NOT DETECTED Final   Neisseria meningitidis NOT DETECTED NOT DETECTED Final   Pseudomonas aeruginosa NOT  DETECTED NOT DETECTED Final   Stenotrophomonas maltophilia NOT DETECTED NOT DETECTED Final   Candida albicans NOT DETECTED NOT DETECTED Final   Candida auris NOT DETECTED NOT DETECTED Final   Candida glabrata NOT DETECTED NOT DETECTED Final   Candida krusei NOT DETECTED NOT DETECTED Final   Candida parapsilosis NOT DETECTED NOT DETECTED Final   Candida tropicalis NOT DETECTED NOT DETECTED Final   Cryptococcus neoformans/gattii NOT DETECTED NOT DETECTED Final   Meth resistant mecA/C and MREJ DETECTED (A) NOT DETECTED Final    Comment: CRITICAL RESULT CALLED TO, READ BACK BY AND VERIFIED WITH: Ardeen Garland, PHARMD AT 0725 ON 09/23/21 BY GM Performed at [facility], [place], [place] [address]   Group A Strep by PCR [facility] Only)     Status: None   Collection Time: 09/22/21  1:40 PM   Specimen: Throat; Sterile Swab  Result Value Ref Range Status   Group A Strep by PCR NOT DETECTED NOT DETECTED Final    Comment: Performed at [facility], [address] [place], [place] [address]  Peritoneal fluid culture w Gram Stain     Status: None (Preliminary result)   Collection Time: 09/22/21  7:29 PM   Specimen: Peritoneal Washings; Peritoneal Fluid  Result Value Ref Range Status   Specimen Description   Final    PERITONEAL Performed at [facility], [address] [place], [place] [address]    Special Requests   Final    NONE Performed at [facility], [address] [place], [place] [address]    Gram Stain   Final    FEW WBC PRESENT, PREDOMINANTLY MONONUCLEAR NO ORGANISMS SEEN    Culture   Final    NO GROWTH < 12 HOURS Performed at [facility] [address] [place], [place] [address]    Report Status PENDING  Incomplete  Gastrointestinal Panel by PCR , Stool     Status: None   Collection Time: 09/23/21  4:45 AM   Specimen: Stool  Result Value Ref Range Status   Campylobacter species NOT DETECTED NOT DETECTED Final    Plesimonas shigelloides NOT DETECTED NOT DETECTED Final   Salmonella species NOT DETECTED NOT DETECTED Final   Yersinia enterocolitica NOT DETECTED NOT  DETECTED Final   Vibrio species NOT DETECTED NOT DETECTED Final   Vibrio cholerae NOT DETECTED NOT DETECTED Final   Enteroaggregative E coli (EAEC) NOT DETECTED NOT DETECTED Final   Enteropathogenic E coli (EPEC) NOT DETECTED NOT DETECTED Final   Enterotoxigenic E coli (ETEC) NOT DETECTED NOT DETECTED Final   Shiga like toxin producing E coli (STEC) NOT DETECTED NOT DETECTED Final   Shigella/Enteroinvasive E coli (EIEC) NOT DETECTED NOT DETECTED Final   Cryptosporidium NOT DETECTED NOT DETECTED Final   Cyclospora cayetanensis NOT DETECTED NOT DETECTED Final   Entamoeba histolytica NOT DETECTED NOT DETECTED Final   Giardia lamblia NOT DETECTED NOT DETECTED Final   Adenovirus F40/41 NOT DETECTED NOT DETECTED Final   Astrovirus NOT DETECTED NOT DETECTED Final   Norovirus GI/GII NOT DETECTED NOT DETECTED Final   Rotavirus A NOT DETECTED NOT DETECTED Final   Sapovirus (I, II, IV, and V) NOT DETECTED NOT DETECTED Final    Comment: Performed at [facility], [address] [place], [place] [address]  C Difficile Quick Screen w PCR reflex     Status: None   Collection Time: 09/23/21  4:45 AM   Specimen: Stool  Result Value Ref Range Status   C Diff antigen NEGATIVE NEGATIVE Final   C Diff toxin NEGATIVE NEGATIVE Final   C Diff interpretation No C. difficile detected.  Final    Comment: Performed at [facility], [address], [place], [place] [address]         Radiology Studies: CT Abdomen Pelvis Wo Contrast  Result Date: 09/22/2021 CLINICAL DATA:  Body aches and congestion with cough and sore throat. EXAM: CT ABDOMEN AND PELVIS WITHOUT CONTRAST TECHNIQUE: Multidetector CT imaging of the abdomen and pelvis was performed following the standard protocol without IV contrast. COMPARISON:  None. FINDINGS: Lower chest:  No acute abnormality. Hepatobiliary: No focal liver abnormality is seen. The gallbladder is moderately distended. No gallstones, gallbladder wall thickening, or biliary dilatation. Pancreas: Unremarkable. No pancreatic ductal dilatation or surrounding inflammatory changes. Spleen: Normal in size without focal abnormality. Adrenals/Urinary Tract: Adrenal glands are unremarkable. Kidneys are normal in size, without renal calculi or hydronephrosis. Multiple subcentimeter exophytic cystic appearing areas are seen involving both kidneys. The urinary bladder is partially contracted. Mild to moderate severity diffuse urinary bladder wall thickening is also seen. Stomach/Bowel: Stomach is within normal limits. Appendix appears normal. No evidence of bowel wall thickening, distention, or inflammatory changes. Vascular/Lymphatic: Aortic atherosclerosis. Subcentimeter para-aortic and aortocaval lymph nodes are present. Reproductive: Prostate is unremarkable. Other: No abdominal wall hernia or abnormality. Intact tubing from a peritoneal dialysis catheter is noted with its distal end seen just above the level of the urinary bladder. No abdominopelvic ascites. Musculoskeletal: No acute or significant osseous findings. IMPRESSION: 1. Urinary bladder wall thickening which may be secondary to mild to moderate severity cystitis. Correlation with urinalysis is recommended. 2. Findings likely representing small renal cysts. Correlation with nonemergent renal ultrasound is recommended. 3. Aortic atherosclerosis. Aortic Atherosclerosis (ICD10-I70.0). Electronically Signed   By: Virgina Norfolk M.D.   On: 09/22/2021 16:51   CT CHEST WO CONTRAST  Result Date: 09/22/2021 CLINICAL DATA:  Cough, persistent EXAM: CT CHEST WITHOUT CONTRAST TECHNIQUE: Multidetector CT imaging of the chest was performed following the standard protocol without IV contrast. COMPARISON:  No prior chest CT FINDINGS: Cardiovascular: Moderate coronary and mild  aortic atherosclerotic calcifications. No significant vascular findings. The heart is normal in size. No pericardial effusion. Mediastinum/Nodes: No enlarged mediastinal or axillary lymph  nodes. Thyroid gland, trachea, and esophagus demonstrate no significant findings. Lungs/Pleura: No focal pulmonary opacity. No pleural effusion or pneumothorax. Upper Abdomen: Please see same day CT abdomen pelvis. Musculoskeletal: No chest wall mass or suspicious bone lesions identified. IMPRESSION: 1. No acute process in the chest. No etiology is seen for the patient's cough. 2. Aortic Atherosclerosis (ICD10-I70.0). Coronary artery atherosclerosis. Electronically Signed   By: Merilyn Baba M.D.   On: 09/22/2021 17:51   DG Chest Port 1 View  Result Date: 09/22/2021 CLINICAL DATA:  Questionable sepsis - evaluate for abnormality EXAM: PORTABLE CHEST 1 VIEW COMPARISON:  January 31, 2017. FINDINGS: Borderline enlargement of the cardiac silhouette, likely accentuated by AP technique. Both lungs are clear. No visible pleural effusions or pneumothorax. No acute osseous abnormality. IMPRESSION: No evidence of acute cardiopulmonary disease. Electronically Signed   By: Margaretha [place] M.D.   On: 09/22/2021 13:49   DG Foot Complete Left  Result Date: 09/22/2021 CLINICAL DATA:  Swelling, concern for osteomyelitis EXAM: LEFT FOOT - COMPLETE 3+ VIEW COMPARISON:  Left foot MRI 02/01/2017, left foot radiograph 01/31/2017 FINDINGS: The patient is status post amputation of most of the fifth toe metatarsal as well as the fifth toe. The remaining portion of the metatarsal is normal in appearance. There is no osseous destruction or erosion in the foot. There is no acute fracture or dislocation. Alignment is normal. The joint spaces are preserved. Vascular calcifications are noted. There is diffuse soft tissue swelling but no soft tissue gas. IMPRESSION: Postsurgical changes in the foot as above. There is no osseous erosion or destruction  identified. There is diffuse soft tissue swelling without soft tissue gas. If there is persistent clinical concern for osteomyelitis, MRI may be considered. Electronically Signed   By: Valetta Mole M.D.   On: 09/22/2021 13:49   ECHOCARDIOGRAM COMPLETE  Result Date: 09/23/2021    ECHOCARDIOGRAM REPORT   Patient Name:   Dalbert Garnet Date of Exam: 09/22/2021 Medical Rec #:  [id number]      Height:       78.0 in Accession #:    [id number]     Weight:       230.0 lb Date of Birth:  August 28, 1971      BSA:          2.396 m Patient Age:    39 years       BP:           119/55 mmHg Patient Gender: M              HR:           91 bpm. Exam Location:  [facility] Procedure: 2D Echo, Cardiac Doppler and Color Doppler Indications:     R01.1 Murmur  History:         Patient has no prior history of Echocardiogram examinations.                  Risk Factors:Hypertension and Diabetes. HIV Infection. Chronic                  kidney disease. Dyspnea.  Sonographer:     Cresenciano Lick RDCS Referring Phys:  [place] EPPI Diagnosing Phys: Yolonda Kida MD IMPRESSIONS  1. Left ventricular ejection fraction, by estimation, is >75%. The left ventricle has hyperdynamic function. The left ventricle has no regional wall motion abnormalities. There is mild concentric left ventricular hypertrophy. Left ventricular diastolic parameters are consistent with Grade II diastolic dysfunction (pseudonormalization).  2. Right ventricular systolic function is normal. The right ventricular size is normal.  3. The mitral valve is grossly normal. No evidence of mitral valve regurgitation.  4. The aortic valve is grossly normal. Aortic valve regurgitation is not visualized. FINDINGS  Left Ventricle: Left ventricular ejection fraction, by estimation, is >75%. The left ventricle has hyperdynamic function. The left ventricle has no regional wall motion abnormalities. The left ventricular internal cavity size was normal in size. There is mild  concentric left ventricular hypertrophy. Left ventricular diastolic parameters are consistent with Grade II diastolic dysfunction (pseudonormalization). Right Ventricle: The right ventricular size is normal. No increase in right ventricular wall thickness. Right ventricular systolic function is normal. Left Atrium: Left atrial size was normal in size. Right Atrium: Right atrial size was normal in size. Pericardium: There is no evidence of pericardial effusion. Mitral Valve: The mitral valve is grossly normal. No evidence of mitral valve regurgitation. Tricuspid Valve: The tricuspid valve is grossly normal. Tricuspid valve regurgitation is mild. Aortic Valve: The aortic valve is grossly normal. Aortic valve regurgitation is not visualized. Aortic valve mean gradient measures 7.0 mmHg. Aortic valve peak gradient measures 11.6 mmHg. Aortic valve area, by VTI measures 3.31 cm. Pulmonic Valve: The pulmonic valve was grossly normal. Pulmonic valve regurgitation is not visualized. Aorta: The ascending aorta was not well visualized. IAS/Shunts: No atrial level shunt detected by color flow Doppler.  LEFT VENTRICLE PLAX 2D LVIDd:         4.40 cm   Diastology LVIDs:         2.50 cm   LV e' medial:    7.09 cm/s LV PW:         1.20 cm   LV E/e' medial:  13.9 LV IVS:        1.20 cm   LV e' lateral:   9.82 cm/s LVOT diam:     1.90 cm   LV E/e' lateral: 10.1 LV SV:         81 LV SV Index:   34 LVOT Area:     2.84 cm  RIGHT VENTRICLE RV Basal diam:  3.80 cm RV S prime:     16.60 cm/s TAPSE (M-mode): 1.6 cm LEFT ATRIUM             Index        RIGHT ATRIUM           Index LA diam:        4.10 cm 1.71 cm/m   RA Area:     14.50 cm LA Vol (A2C):   42.9 ml 17.91 ml/m  RA Volume:   37.10 ml  15.49 ml/m LA Vol (A4C):   35.7 ml 14.90 ml/m LA Biplane Vol: 40.2 ml 16.78 ml/m  AORTIC VALVE AV Area (Vmax):    2.62 cm AV Area (Vmean):   2.77 cm AV Area (VTI):     3.31 cm AV Vmax:           170.00 cm/s AV Vmean:          131.000 cm/s  AV VTI:            0.246 m AV Peak Grad:      11.6 mmHg AV Mean Grad:      7.0 mmHg LVOT Vmax:         157.00 cm/s LVOT Vmean:        128.000 cm/s LVOT VTI:          0.287 m LVOT/AV  VTI ratio: 1.17  AORTA Ao Root diam: 3.40 cm Ao Asc diam:  3.50 cm MITRAL VALVE MV Area (PHT): 2.60 cm    SHUNTS MV Decel Time: 292 msec    Systemic VTI:  0.29 m MV E velocity: 98.80 cm/s  Systemic Diam: 1.90 cm MV A velocity: 90.70 cm/s MV E/A ratio:  1.09 Dwayne D Callwood MD Electronically signed by Yolonda Kida MD Signature Date/Time: 09/23/2021/1:17:04 PM    Final         Scheduled Meds:  abacavir  300 mg Oral BID   amLODipine  5 mg Oral Daily   carvedilol  25 mg Oral BID   dolutegravir  50 mg Oral BID   heparin  5,000 Units Subcutaneous Q8H   insulin aspart  0-6 Units Subcutaneous TID WC   lamiVUDine  25 mg Oral QHS   losartan  100 mg Oral Daily   minoxidil  2.5 mg Oral Q2000   And   [START ON 09/24/2021] minoxidil  2.5 mg Oral Once per day on Sun Tue Thu Sat   sodium chloride flush  3 mL Intravenous Q12H   vancomycin variable dose per unstable renal function (pharmacist dosing)   Does not apply See admin instructions   Continuous Infusions:  sodium chloride       LOS: 0 days       Phillips Climes, MD [facility]   To contact the attending provider between 7A-7P or the covering provider during after hours 7P-7A, please log into the web site [email or web address] and access using universal [place] password for that web site. If you do not have the password, please call the hospital operator.  09/23/2021, 3:53 PM

## 2021-09-23 NOTE — Progress Notes (Signed)
Mobility Specialist - Progress Note   09/23/21 1540  Mobility  Activity Ambulated to bathroom  Level of Assistance Standby assist, set-up cues, supervision of patient - no hands on  Assistive Device None  Distance Ambulated (ft) 15 ft  Mobility Out of bed for toileting  Mobility Response Tolerated well  Mobility performed by Mobility specialist  $Mobility charge 1 Mobility    Mobility responded to bed alarm, pt sitting EOB on arrival wanting to ambulate to bathroom. Supervision required d/t dizziness and unsteadiness. Pt was left on toilet and instructed to notifiy staff when finished. Pt showed understanding.    Kathee Delton Mobility Specialist 09/23/21, 3:42 PM

## 2021-09-23 NOTE — Progress Notes (Signed)
Patient arrived to floor and ambulated to bed.  Alert and oriented x4.  Oriented to room and call bell. Bed alarm in use and bed in lowest position.

## 2021-09-23 NOTE — Consult Note (Addendum)
NAME: Nathan Ayers  DOB: 1971/06/10  MRN: [id number]  Date/Time: 09/23/2021 10:49 PM  REQUESTING PROVIDER: Dr.Elgergawy Subjective:  REASON FOR CONSULT: MRSA bacteremia ? Nathan Ayers is a 50 y.o. with a history of Diabetes mellitus, HIV, ESRD on PD, HTN presents with sob, diarrhea, cough, body ache and not feeling well for 2 weeks- pt went to [facility] ED  on 11/11 with upper resp symptoms and the POC test was positive for strep A on 09/18/21 and was given penicllin, his upper resp symptoms including throat pain improved but he was not feeling well . Now he has copious loose stools and he is afraid to eat. He had an episode of nausea and vomiting  Cd4 was 457 in march 20022 and Vl undetectable in sept 2022 and is on abacavir, dolutegravir and epivir  Pt does Peritoneal dialysis- since the past year- he has no pain abdomen or distension Vitals in the ED 118/61, Temp 99.7, Pulse 83, sats 98% Labs showed wbc of 25.9, HB 9.2, PLT 303  Past Medical History:  Diagnosis Date   Anxiety    Chronic kidney disease (CKD), active medical management without dialysis    Mon, Wed and Fri   Chronic kidney disease on chronic dialysis ([place])    Diabetes mellitus ([place])    Diabetic neuropathy ([place])    Dyspnea    HIV infection ([place])    Hypertension     Past Surgical History:  Procedure Laterality Date   AMPUTATION TOE Left 02/04/2017   Procedure: AMPUTATION 5TH METATARSAL AND JOINT;  Surgeon: Samara Deist, DPM;  Location: [facility] ORS;  Service: Podiatry;  Laterality: Left;   APPLICATION OF WOUND VAC Left 05/20/2017   Procedure: APPLICATION OF WOUND VAC;  Surgeon: Samara Deist, DPM;  Location: [facility] OR[facility];  Service: Podiatry;  Laterality: Left;   AV FISTULA PLACEMENT     BONE EXCISION Left 02/17/2018   Procedure: BONE EXCISION METATARSAL;  Surgeon: Samara Deist, DPM;  Location: [facility] OR[facility];  Service: Podiatry;  Laterality: Left;   INCISION AND DRAINAGE OF WOUND Left 05/20/2017   Procedure:  IRRIGATION AND DEBRIDEMENT WOUND;  Surgeon: Samara Deist, DPM;  Location: [facility] OR[facility];  Service: Podiatry;  Laterality: Left;    Social History   Socioeconomic History   Marital status: Single    Spouse name: Not on file   Number of children: Not on file   Years of education: Not on file   Highest education level: Not on file  Occupational History   Not on file  Tobacco Use   Smoking status: Never   Smokeless tobacco: Never  Vaping Use   Vaping Use: Never used  Substance and Sexual Activity   Alcohol use: No   Drug use: No   Sexual activity: Not Currently    Partners: Male    Birth control/protection: Condom  Other Topics Concern   Not on file  Social History Narrative   Not on file   Social Determinants of Health   Financial Resource Strain: Not on file  Food Insecurity: Not on file  Transportation Needs: Not on file  Physical Activity: Not on file  Stress: Not on file  Social Connections: Not on file  Intimate Partner Violence: Not on file    Family History  Problem Relation Age of Onset   Diabetes Mother    Hypertension Mother    Allergies  Allergen Reactions   Lac Bovis Other (See Comments)    PLEASE DO NOT GIVE PATIENT MILK PRODUCT.  PATIENT IS LACTOSE INTOLERANT.    I? Current Facility-Administered Medications  Medication Dose Route Frequency Provider Last Rate Last Admin   0.9 %  sodium chloride infusion  250 mL Intravenous PRN Wouk, Ailene Rud, MD       abacavir [facility]) tablet 300 mg  300 mg Oral BID Gwynne Edinger, MD   300 mg at 09/23/21 2219   amLODipine (NORVASC) tablet 5 mg  5 mg Oral Daily Gwynne Edinger, MD   5 mg at 09/23/21 0855   carvedilol (COREG) tablet 25 mg  25 mg Oral BID Gwynne Edinger, MD   25 mg at 09/23/21 2219   dialysis solution 2.5% low-MG/low-CA dianeal solution   Intraperitoneal Q24H Kolluru, Sarath, MD       dolutegravir (TIVICAY) tablet 50 mg  50 mg Oral BID Gwynne Edinger, MD   50 mg at 09/23/21 2219    gentamicin cream (GARAMYCIN) 0.1 % 1 application  1 application Topical Daily Kolluru, Sarath, MD       heparin injection 5,000 Units  5,000 Units Subcutaneous Q8H Gwynne Edinger, MD   5,000 Units at 09/23/21 2219   insulin aspart (novoLOG) injection 0-6 Units  0-6 Units Subcutaneous TID WC Wouk, Ailene Rud, MD       lamiVUDine (EPIVIR) 10 MG/ML solution 25 mg  25 mg Oral QHS Gwynne Edinger, MD   25 mg at 09/23/21 2219   losartan (COZAAR) tablet 100 mg  100 mg Oral Daily Gwynne Edinger, MD   100 mg at 09/23/21 0856   minoxidil (LONITEN) tablet 2.5 mg  2.5 mg Oral Q2000 Tawnya Crook, RPH   2.5 mg at 09/23/21 2219   And   [START ON 09/24/2021] minoxidil (LONITEN) tablet 2.5 mg  2.5 mg Oral Once per day on Sun Tue Thu Sat [place], Abby K, RPH       sodium chloride flush (NS) 0.9 % injection 3 mL  3 mL Intravenous Q12H Wouk, Ailene Rud, MD   3 mL at 09/23/21 2220   sodium chloride flush (NS) 0.9 % injection 3 mL  3 mL Intravenous PRN Wouk, Ailene Rud, MD       vancomycin variable dose per unstable renal function (pharmacist dosing)   Does not apply See admin instructions Berton Mount, RPH         Abtx:  Anti-infectives (From admission, onward)    Start     Dose/Rate Route Frequency Ordered Stop   09/23/21 1349  vancomycin variable dose per unstable renal function (pharmacist dosing)         Does not apply See admin instructions 09/23/21 1349     09/23/21 1000  vancomycin (VANCOREADY) IVPB 1500 mg/300 mL        1,500 mg 150 mL/hr over 120 Minutes Intravenous  Once 09/23/21 0908 09/23/21 1509   09/22/21 2200  lamiVUDine (EPIVIR) 10 MG/ML solution 25 mg        25 mg Oral Daily at bedtime 09/22/21 1727     09/22/21 2200  dolutegravir (TIVICAY) tablet 50 mg        50 mg Oral 2 times daily 09/22/21 1727     09/22/21 2200  abacavir (ZIAGEN) tablet 300 mg        300 mg Oral 2 times daily 09/22/21 1727     09/22/21 1500  ceFEPIme (MAXIPIME) 2 g in sodium chloride 0.9 %  100 mL IVPB        2 g 200 mL/hr  over 30 Minutes Intravenous  Once 09/22/21 1447 09/22/21 1919   09/22/21 1500  metroNIDAZOLE (FLAGYL) IVPB 500 mg        500 mg 100 mL/hr over 60 Minutes Intravenous  Once 09/22/21 1447 09/22/21 1919   09/22/21 1500  vancomycin (VANCOCIN) IVPB 1000 mg/200 mL premix        1,000 mg 200 mL/hr over 60 Minutes Intravenous  Once 09/22/21 1447 09/22/21 1919       REVIEW OF SYSTEMS:  Const:  fever,  chills, negative weight loss Eyes: negative diplopia or visual changes, negative eye pain ENT: coryza, negative sore throat Resp:  cough, no hemoptysis, has dyspnea Cards: negative for chest pain, palpitations, lower extremity edema GU: negative for frequency, dysuria and hematuria GI: Negative for abdominal pain, has diarrhea, no bleeding, constipation Skin: negative for rash and pruritus Heme: negative for easy bruising and gum/nose bleeding MS: negative for myalgias, arthralgias, back pain and muscle weakness Neurolo:negative for headaches, dizziness, vertigo, memory problems  Psych: negative for feelings of anxiety, depression  Endocrine:  diabetes Allergy/Immunology- milk allergy ?  Objective:  VITALS:  BP 140/71 (BP Location: Left Arm)   Pulse 81   Temp 98.4 F (36.9 C) (Oral)   Resp 18   Ht 6' 6"  (1.981 m)   Wt 104.3 kg   SpO2 98%   BMI 26.58 kg/m  PHYSICAL EXAM:  General: Alert, cooperative, no distress, appears stated age. Chronically ill, puffy face Head: Normocephalic, without obvious abnormality, atraumatic. Eyes: Conjunctivae clear, anicteric sclerae. Pupils are equal ENT Nares normal. No drainage or sinus tenderness. Lips, mucosa, and tongue normal. No Thrush Neck: Supple, symmetrical, no adenopathy, thyroid: non tender no carotid bruit and no JVD. Back: No CVA tenderness. Lungs: b/l air entry Heart: Regular rate and rhythm, no murmur, rub or gallop. Abdomen: Soft, non-tender,not distended. Dialysis cath site no erythema  Bowel  sounds normal. No masses Extremities:left foot- surgical scar- little toe amputation  Scaly skin Skin: No rashes or lesions. Or bruising Lymph: Cervical, supraclavicular normal. Neurologic: Grossly non-focal Pertinent Labs Lab Results CBC    Component Value Date/Time   WBC 29.9 (H) 09/23/2021 0445   RBC 2.83 (L) 09/23/2021 0445   HGB 8.3 (L) 09/23/2021 0445   HCT 23.9 (L) 09/23/2021 0445   PLT 334 09/23/2021 0445   MCV 84.5 09/23/2021 0445   MCH 29.3 09/23/2021 0445   MCHC 34.7 09/23/2021 0445   RDW 13.9 09/23/2021 0445   LYMPHSABS 2.2 09/22/2021 1332   MONOABS 2.2 (H) 09/22/2021 1332   EOSABS 0.2 09/22/2021 1332   BASOSABS 0.1 09/22/2021 1332    CMP Latest Ref Rng & Units 09/23/2021 09/22/2021 02/17/2018  Glucose 70 - 99 mg/dL 147(H) 122(H) 176(H)  BUN 6 - 20 mg/dL 106(H) 113(H) -  Creatinine 0.61 - 1.24 mg/dL 24.45(H) 23.91(H) -  Sodium 135 - 145 mmol/L 134(L) 132(L) 136  Potassium 3.5 - 5.1 mmol/L 4.5 4.4 5.1  Chloride 98 - 111 mmol/L 92(L) 91(L) -  CO2 22 - 32 mmol/L 22 20(L) -  Calcium 8.9 - 10.3 mg/dL 9.3 9.3 -  Total Protein 6.5 - 8.1 g/dL 7.9 8.5(H) -  Total Bilirubin 0.3 - 1.2 mg/dL 0.7 1.0 -  Alkaline Phos 38 - 126 U/L 73 73 -  AST 15 - 41 U/L 14(L) 12(L) -  ALT 0 - 44 U/L 14 15 -      Microbiology: Recent Results (from the past 240 hour(s))  Resp Panel by RT-PCR (Flu A&B, Covid) Nasopharyngeal Swab  Status: None   Collection Time: 09/22/21 11:30 AM   Specimen: Nasopharyngeal Swab; Nasopharyngeal(NP) swabs in vial transport medium  Result Value Ref Range Status   SARS Coronavirus 2 by RT PCR NEGATIVE NEGATIVE Final    Comment: (NOTE) SARS-CoV-2 target nucleic acids are NOT DETECTED.  The SARS-CoV-2 RNA is generally detectable in upper respiratory specimens during the acute phase of infection. The lowest concentration of SARS-CoV-2 viral copies this assay can detect is 138 copies/mL. A negative result does not preclude SARS-Cov-2 infection and  should not be used as the sole basis for treatment or other patient management decisions. A negative result may occur with  improper specimen collection/handling, submission of specimen other than nasopharyngeal swab, presence of viral mutation(s) within the areas targeted by this assay, and inadequate number of viral copies(<138 copies/mL). A negative result must be combined with clinical observations, patient history, and epidemiological information. The expected result is Negative.  Fact Sheet for Patients:  EntrepreneurPulse.com.au  Fact Sheet for Healthcare Providers:  IncredibleEmployment.be  This test is no t yet approved or cleared by the [place] FDA and  has been authorized for detection and/or diagnosis of SARS-CoV-2 by FDA under an Emergency Use Authorization (EUA). This EUA will remain  in effect (meaning this test can be used) for the duration of the COVID-19 declaration under Section 564(b)(1) of the Act, 21 U.S.C.section 360bbb-3(b)(1), unless the authorization is terminated  or revoked sooner.       Influenza A by PCR NEGATIVE NEGATIVE Final   Influenza B by PCR NEGATIVE NEGATIVE Final    Comment: (NOTE) The Xpert Xpress SARS-CoV-2/FLU/RSV plus assay is intended as an aid in the diagnosis of influenza from Nasopharyngeal swab specimens and should not be used as a sole basis for treatment. Nasal washings and aspirates are unacceptable for Xpert Xpress SARS-CoV-2/FLU/RSV testing.  Fact Sheet for Patients: EntrepreneurPulse.com.au  Fact Sheet for Healthcare Providers: IncredibleEmployment.be  This test is not yet approved or cleared by the [place] FDA and has been authorized for detection and/or diagnosis of SARS-CoV-2 by FDA under an Emergency Use Authorization (EUA). This EUA will remain in effect (meaning this test can be used) for the duration of the COVID-19 declaration  under Section 564(b)(1) of the Act, 21 U.S.C. section 360bbb-3(b)(1), unless the authorization is terminated or revoked.  Performed at [facility], [place], [place] [address]   Respiratory (~20 pathogens) panel by PCR     Status: None   Collection Time: 09/22/21 11:30 AM   Specimen: Peritoneal Washings; Respiratory  Result Value Ref Range Status   Adenovirus NOT DETECTED NOT DETECTED Final   Coronavirus 229E NOT DETECTED NOT DETECTED Final    Comment: (NOTE) The Coronavirus on the Respiratory Panel, DOES NOT test for the novel  Coronavirus (2019 nCoV)    Coronavirus HKU1 NOT DETECTED NOT DETECTED Final   Coronavirus NL63 NOT DETECTED NOT DETECTED Final   Coronavirus OC43 NOT DETECTED NOT DETECTED Final   Metapneumovirus NOT DETECTED NOT DETECTED Final   Rhinovirus / Enterovirus NOT DETECTED NOT DETECTED Final   Influenza A NOT DETECTED NOT DETECTED Final   Influenza B NOT DETECTED NOT DETECTED Final   Parainfluenza Virus 1 NOT DETECTED NOT DETECTED Final   Parainfluenza Virus 2 NOT DETECTED NOT DETECTED Final   Parainfluenza Virus 3 NOT DETECTED NOT DETECTED Final   Parainfluenza Virus 4 NOT DETECTED NOT DETECTED Final   Respiratory Syncytial Virus NOT DETECTED NOT DETECTED Final   Bordetella pertussis NOT  DETECTED NOT DETECTED Final   Bordetella Parapertussis NOT DETECTED NOT DETECTED Final   Chlamydophila pneumoniae NOT DETECTED NOT DETECTED Final   Mycoplasma pneumoniae NOT DETECTED NOT DETECTED Final    Comment: Performed at [facility], [place] [address], [place], [place] [address]  Blood Culture (routine x 2)     Status: None (Preliminary result)   Collection Time: 09/22/21  1:32 PM   Specimen: BLOOD  Result Value Ref Range Status   Specimen Description   Final    BLOOD LEFT ANTECUBITAL Performed at [facility], [address], [place], [place] [address]    Special Requests   Final    BOTTLES DRAWN AEROBIC AND ANAEROBIC Blood  Culture results may not be optimal due to an excessive volume of blood received in culture bottles Performed at [facility], [address], [place], [place] [address]    Culture  Setup Time AEROBIC BOTTLE ONLY  Final   Culture   Final    NO GROWTH < 24 HOURS Performed at [facility], [address] [place], [place] [address]    Report Status PENDING  Incomplete  Blood Culture (routine x 2)     Status: None (Preliminary result)   Collection Time: 09/22/21  1:32 PM   Specimen: BLOOD  Result Value Ref Range Status   Specimen Description BLOOD LEFT ANTECUBITAL  Final   Special Requests   Final    BOTTLES DRAWN AEROBIC AND ANAEROBIC Blood Culture adequate volume   Culture  Setup Time   Final    GRAM POSITIVE COCCI IN BOTH AEROBIC AND ANAEROBIC BOTTLES Organism ID to follow CRITICAL RESULT CALLED TO, READ BACK BY AND VERIFIED WITH: Ardeen Garland, PHARMD AT [place] ON 09/23/21 BY GM Performed at [facility], [address] [place], [place] [address]    Culture GRAM POSITIVE COCCI  Final   Report Status PENDING  Incomplete  Blood Culture ID Panel (Reflexed)     Status: Abnormal   Collection Time: 09/22/21  1:32 PM  Result Value Ref Range Status   Enterococcus faecalis NOT DETECTED NOT DETECTED Final   Enterococcus Faecium NOT DETECTED NOT DETECTED Final   Listeria monocytogenes NOT DETECTED NOT DETECTED Final   Staphylococcus species DETECTED (A) NOT DETECTED Final    Comment: CRITICAL RESULT CALLED TO, READ BACK BY AND VERIFIED WITH: MORGAN HICKS, PHARMD AT 0725 ON 09/23/21 BY GM    Staphylococcus aureus (BCID) DETECTED (A) NOT DETECTED Final    Comment: Methicillin (oxacillin)-resistant Staphylococcus aureus (MRSA). MRSA is predictably resistant to beta-lactam antibiotics (except ceftaroline). Preferred therapy is vancomycin unless clinically contraindicated. Patient requires contact precautions if  hospitalized. CRITICAL RESULT CALLED TO, READ BACK BY  AND VERIFIED WITH: MORGAN HICKS, PHARMD AT 0725 ON 09/23/21 BY GM    Staphylococcus epidermidis NOT DETECTED NOT DETECTED Final   Staphylococcus lugdunensis NOT DETECTED NOT DETECTED Final   Streptococcus species NOT DETECTED NOT DETECTED Final   Streptococcus agalactiae NOT DETECTED NOT DETECTED Final   Streptococcus pneumoniae NOT DETECTED NOT DETECTED Final   Streptococcus pyogenes NOT DETECTED NOT DETECTED Final   A.calcoaceticus-baumannii NOT DETECTED NOT DETECTED Final   Bacteroides fragilis NOT DETECTED NOT DETECTED Final   Enterobacterales NOT DETECTED NOT DETECTED Final   Enterobacter cloacae complex NOT DETECTED NOT DETECTED Final   Escherichia coli NOT DETECTED NOT DETECTED Final   Klebsiella aerogenes NOT DETECTED NOT DETECTED Final   Klebsiella oxytoca NOT DETECTED NOT DETECTED Final   Klebsiella pneumoniae NOT  DETECTED NOT DETECTED Final   Proteus species NOT DETECTED NOT DETECTED Final   Salmonella species NOT DETECTED NOT DETECTED Final   Serratia marcescens NOT DETECTED NOT DETECTED Final   Haemophilus influenzae NOT DETECTED NOT DETECTED Final   Neisseria meningitidis NOT DETECTED NOT DETECTED Final   Pseudomonas aeruginosa NOT DETECTED NOT DETECTED Final   Stenotrophomonas maltophilia NOT DETECTED NOT DETECTED Final   Candida albicans NOT DETECTED NOT DETECTED Final   Candida auris NOT DETECTED NOT DETECTED Final   Candida glabrata NOT DETECTED NOT DETECTED Final   Candida krusei NOT DETECTED NOT DETECTED Final   Candida parapsilosis NOT DETECTED NOT DETECTED Final   Candida tropicalis NOT DETECTED NOT DETECTED Final   Cryptococcus neoformans/gattii NOT DETECTED NOT DETECTED Final   Meth resistant mecA/C and MREJ DETECTED (A) NOT DETECTED Final    Comment: CRITICAL RESULT CALLED TO, READ BACK BY AND VERIFIED WITH: Ardeen Garland, PHARMD AT 0725 ON 09/23/21 BY GM Performed at [facility], [place], [place] [address]   Group A Strep by PCR  [facility] Only)     Status: None   Collection Time: 09/22/21  1:40 PM   Specimen: Throat; Sterile Swab  Result Value Ref Range Status   Group A Strep by PCR NOT DETECTED NOT DETECTED Final    Comment: Performed at [facility], [address] [place], [place] [address]  Peritoneal fluid culture w Gram Stain     Status: None (Preliminary result)   Collection Time: 09/22/21  7:29 PM   Specimen: Peritoneal Washings; Peritoneal Fluid  Result Value Ref Range Status   Specimen Description   Final    PERITONEAL Performed at [facility], [address] [place], [place] [address]    Special Requests   Final    NONE Performed at [facility], [address] [place], [place] [address]    Gram Stain   Final    FEW WBC PRESENT, PREDOMINANTLY MONONUCLEAR NO ORGANISMS SEEN    Culture   Final    NO GROWTH < 12 HOURS Performed at [place] [address], [place],  [address]    Report Status PENDING  Incomplete  Gastrointestinal Panel by PCR , Stool     Status: None   Collection Time: 09/23/21  4:45 AM   Specimen: Stool  Result Value Ref Range Status   Campylobacter species NOT DETECTED NOT DETECTED Final   Plesimonas shigelloides NOT DETECTED NOT DETECTED Final   Salmonella species NOT DETECTED NOT DETECTED Final   Yersinia enterocolitica NOT DETECTED NOT DETECTED Final   Vibrio species NOT DETECTED NOT DETECTED Final   Vibrio cholerae NOT DETECTED NOT DETECTED Final   Enteroaggregative E coli (EAEC) NOT DETECTED NOT DETECTED Final   Enteropathogenic E coli (EPEC) NOT DETECTED NOT DETECTED Final   Enterotoxigenic E coli (ETEC) NOT DETECTED NOT DETECTED Final   Shiga like toxin producing E coli (STEC) NOT DETECTED NOT DETECTED Final   Shigella/Enteroinvasive E coli (EIEC) NOT DETECTED NOT DETECTED Final   Cryptosporidium NOT DETECTED NOT DETECTED Final   Cyclospora cayetanensis NOT DETECTED NOT DETECTED Final   Entamoeba histolytica NOT  DETECTED NOT DETECTED Final   Giardia lamblia NOT DETECTED NOT DETECTED Final   Adenovirus F40/41 NOT DETECTED NOT DETECTED Final   Astrovirus NOT DETECTED NOT DETECTED Final   Norovirus GI/GII NOT DETECTED NOT DETECTED Final   Rotavirus A NOT DETECTED NOT DETECTED Final   Sapovirus (I, II, IV, and  V) NOT DETECTED NOT DETECTED Final    Comment: Performed at [facility], [place], [place] [address]  C Difficile Quick Screen w PCR reflex     Status: None   Collection Time: 09/23/21  4:45 AM   Specimen: Stool  Result Value Ref Range Status   C Diff antigen NEGATIVE NEGATIVE Final   C Diff toxin NEGATIVE NEGATIVE Final   C Diff interpretation No C. difficile detected.  Final    Comment: Performed at [facility], [address] [place], [place] [address]    IMAGING RESULTS:  I have personally reviewed the films ?CXR- no infiltrate  Impression/Recommendation ? ?MRSA bacteremia- source unclear Repeat blood culture 2 d echo'TEE Continue vanco  ?Peritoneal dialysis- Fluid analysis insignificant  with no evidence of peritonitis  __ESRD on PD Anemia  Diarrhea- could be from antibiotic VS primary illness GI panel and cdiff negative- so can give him imodium couple of doses    HIV disease- followed at [facility] Well controlled. Vl undetectable and cd4 is 457- doultegravir is 47m QD and not BID- change- continue abacavir 3043mPO BID and epivir 25 mg Qd  HTN- on amlodipine, carvedilol and losartan   ____DM- on insulin ________________________________________ Discussed with patient, requesting provider Note:  This document was prepared using Dragon voice recognition software and may include unintentional dictation errors.

## 2021-09-23 NOTE — Progress Notes (Signed)
[facility]  ROUNDING NOTE   Subjective:   [name] was admitted to [facility] on 09/22/2021 for Leukocytosis [D72.829]  Last peritoneal dialysis treatment was the night of 11/14.   Patient was found to have strep and treated with PO antibiotics. Patient started to have fever but that also resolved along with his URI symptoms. Now presents with nausea and vomiting.   Patient states he has no diarrhea.   Today, he states he is having mild shortness of breath and cough.   PD held last night due to patient being in the Emergency Department.   Objective:  Vital signs in last 24 hours:  Temp:  [99.7 F (37.6 C)] 99.7 F (37.6 C) (11/15 1122) Pulse Rate:  [83-93] 85 (11/16 0642) Resp:  [17] 17 (11/16 0642) BP: (106-133)/(49-85) 106/49 (11/16 0642) SpO2:  [94 %-99 %] 99 % (11/16 0642) Weight:  [104.3 kg] 104.3 kg (11/15 1123)  Weight change:  Filed Weights   09/22/21 1123  Weight: 104.3 kg    Intake/Output: I/O last 3 completed shifts: In: 900.1 [IV Piggyback:900.1] Out: -    Intake/Output this shift:  No intake/output data recorded.  Physical Exam: General: NAD, laying in bed  Head: Normocephalic, atraumatic. Moist oral mucosal membranes  Eyes: Anicteric, PERRL  Neck: Supple, trachea midline  Lungs:  Clear to auscultation  Heart: Regular rate and rhythm  Abdomen:  Soft, nontender,   Extremities: No peripheral edema.  Neurologic: Nonfocal, moving all four extremities  Skin: No lesions  Access: PD catheter    Basic Metabolic Panel: Recent Labs  Lab 09/22/21 1332 09/23/21 0445  NA 132* 134*  K 4.4 4.5  CL 91* 92*  CO2 20* 22  GLUCOSE 122* 147*  BUN 113* 106*  CREATININE 23.91* 24.45*  CALCIUM 9.3 9.3    Liver Function Tests: Recent Labs  Lab 09/22/21 1332 09/23/21 0445  AST 12* 14*  ALT 15 14  ALKPHOS 73 73  BILITOT 1.0 0.7  PROT 8.5* 7.9  ALBUMIN 2.6* 2.4*   No results for input(s): LIPASE, AMYLASE in the last 168 hours. No  results for input(s): AMMONIA in the last 168 hours.  CBC: Recent Labs  Lab 09/22/21 1332 09/23/21 0445  WBC 25.9* 29.9*  NEUTROABS 20.7*  --   HGB 9.2* 8.3*  HCT 26.2* 23.9*  MCV 84.8 84.5  PLT 303 334    Cardiac Enzymes: No results for input(s): CKTOTAL, CKMB, CKMBINDEX, TROPONINI in the last 168 hours.  BNP: Invalid input(s): POCBNP  CBG: Recent Labs  Lab 09/22/21 2221 09/23/21 0833  GLUCAP 172* 126*    Microbiology: Results for orders placed or performed during the hospital encounter of 09/22/21  Resp Panel by RT-PCR (Flu A&B, Covid) Nasopharyngeal Swab     Status: None   Collection Time: 09/22/21 11:30 AM   Specimen: Nasopharyngeal Swab; Nasopharyngeal(NP) swabs in vial transport medium  Result Value Ref Range Status   SARS Coronavirus 2 by RT PCR NEGATIVE NEGATIVE Final    Comment: (NOTE) SARS-CoV-2 target nucleic acids are NOT DETECTED.  The SARS-CoV-2 RNA is generally detectable in upper respiratory specimens during the acute phase of infection. The lowest concentration of SARS-CoV-2 viral copies this assay can detect is 138 copies/mL. A negative result does not preclude SARS-Cov-2 infection and should not be used as the sole basis for treatment or other patient management decisions. A negative result may occur with  improper specimen collection/handling, submission of specimen other than nasopharyngeal swab, presence of viral mutation(s)  within the areas targeted by this assay, and inadequate number of viral copies(<138 copies/mL). A negative result must be combined with clinical observations, patient history, and epidemiological information. The expected result is Negative.  Fact Sheet for Patients:  EntrepreneurPulse.com.au  Fact Sheet for Healthcare Providers:  IncredibleEmployment.be  This test is no t yet approved or cleared by the [place] FDA and  has been authorized for detection and/or diagnosis of  SARS-CoV-2 by FDA under an Emergency Use Authorization (EUA). This EUA will remain  in effect (meaning this test can be used) for the duration of the COVID-19 declaration under Section 564(b)(1) of the Act, 21 U.S.C.section 360bbb-3(b)(1), unless the authorization is terminated  or revoked sooner.       Influenza A by PCR NEGATIVE NEGATIVE Final   Influenza B by PCR NEGATIVE NEGATIVE Final    Comment: (NOTE) The Xpert Xpress SARS-CoV-2/FLU/RSV plus assay is intended as an aid in the diagnosis of influenza from Nasopharyngeal swab specimens and should not be used as a sole basis for treatment. Nasal washings and aspirates are unacceptable for Xpert Xpress SARS-CoV-2/FLU/RSV testing.  Fact Sheet for Patients: EntrepreneurPulse.com.au  Fact Sheet for Healthcare Providers: IncredibleEmployment.be  This test is not yet approved or cleared by the [place] FDA and has been authorized for detection and/or diagnosis of SARS-CoV-2 by FDA under an Emergency Use Authorization (EUA). This EUA will remain in effect (meaning this test can be used) for the duration of the COVID-19 declaration under Section 564(b)(1) of the Act, 21 U.S.C. section 360bbb-3(b)(1), unless the authorization is terminated or revoked.  Performed at [facility], [place], [place] [address]   Respiratory (~20 pathogens) panel by PCR     Status: None   Collection Time: 09/22/21 11:30 AM   Specimen: Peritoneal Washings; Respiratory  Result Value Ref Range Status   Adenovirus NOT DETECTED NOT DETECTED Final   Coronavirus 229E NOT DETECTED NOT DETECTED Final    Comment: (NOTE) The Coronavirus on the Respiratory Panel, DOES NOT test for the novel  Coronavirus (2019 nCoV)    Coronavirus HKU1 NOT DETECTED NOT DETECTED Final   Coronavirus NL63 NOT DETECTED NOT DETECTED Final   Coronavirus OC43 NOT DETECTED NOT DETECTED Final   Metapneumovirus NOT  DETECTED NOT DETECTED Final   Rhinovirus / Enterovirus NOT DETECTED NOT DETECTED Final   Influenza A NOT DETECTED NOT DETECTED Final   Influenza B NOT DETECTED NOT DETECTED Final   Parainfluenza Virus 1 NOT DETECTED NOT DETECTED Final   Parainfluenza Virus 2 NOT DETECTED NOT DETECTED Final   Parainfluenza Virus 3 NOT DETECTED NOT DETECTED Final   Parainfluenza Virus 4 NOT DETECTED NOT DETECTED Final   Respiratory Syncytial Virus NOT DETECTED NOT DETECTED Final   Bordetella pertussis NOT DETECTED NOT DETECTED Final   Bordetella Parapertussis NOT DETECTED NOT DETECTED Final   Chlamydophila pneumoniae NOT DETECTED NOT DETECTED Final   Mycoplasma pneumoniae NOT DETECTED NOT DETECTED Final    Comment: Performed at [facility], [place]. [address], [place], [place] [address]  Blood Culture (routine x 2)     Status: None (Preliminary result)   Collection Time: 09/22/21  1:32 PM   Specimen: BLOOD  Result Value Ref Range Status   Specimen Description   Final    BLOOD LEFT ANTECUBITAL Performed at [facility], [address] [place], [place] [address]    Special Requests   Final    BOTTLES DRAWN AEROBIC AND ANAEROBIC Blood Culture results may  not be optimal due to an excessive volume of blood received in culture bottles Performed at [facility], [address] [place], [place] [address]    Culture  Setup Time AEROBIC BOTTLE ONLY  Final   Culture   Final    NO GROWTH < 24 HOURS Performed at [facility], [address] [place], [place] [address]    Report Status PENDING  Incomplete  Blood Culture (routine x 2)     Status: None (Preliminary result)   Collection Time: 09/22/21  1:32 PM   Specimen: BLOOD  Result Value Ref Range Status   Specimen Description BLOOD LEFT ANTECUBITAL  Final   Special Requests   Final    BOTTLES DRAWN AEROBIC AND ANAEROBIC Blood Culture adequate volume   Culture  Setup Time   Final    GRAM POSITIVE COCCI IN BOTH AEROBIC AND  ANAEROBIC BOTTLES Organism ID to follow CRITICAL RESULT CALLED TO, READ BACK BY AND VERIFIED WITH: Ardeen Garland, PHARMD AT 0725 ON 09/23/21 BY GM Performed at [facility], [address] [place], [place] [address]    Culture GRAM POSITIVE COCCI  Final   Report Status PENDING  Incomplete  Blood Culture ID Panel (Reflexed)     Status: Abnormal   Collection Time: 09/22/21  1:32 PM  Result Value Ref Range Status   Enterococcus faecalis NOT DETECTED NOT DETECTED Final   Enterococcus Faecium NOT DETECTED NOT DETECTED Final   Listeria monocytogenes NOT DETECTED NOT DETECTED Final   Staphylococcus species DETECTED (A) NOT DETECTED Final    Comment: CRITICAL RESULT CALLED TO, READ BACK BY AND VERIFIED WITH: MORGAN HICKS, PHARMD AT 0725 ON 09/23/21 BY GM    Staphylococcus aureus (BCID) DETECTED (A) NOT DETECTED Final    Comment: Methicillin (oxacillin)-resistant Staphylococcus aureus (MRSA). MRSA is predictably resistant to beta-lactam antibiotics (except ceftaroline). Preferred therapy is vancomycin unless clinically contraindicated. Patient requires contact precautions if  hospitalized. CRITICAL RESULT CALLED TO, READ BACK BY AND VERIFIED WITH: MORGAN HICKS, PHARMD AT 0725 ON 09/23/21 BY GM    Staphylococcus epidermidis NOT DETECTED NOT DETECTED Final   Staphylococcus lugdunensis NOT DETECTED NOT DETECTED Final   Streptococcus species NOT DETECTED NOT DETECTED Final   Streptococcus agalactiae NOT DETECTED NOT DETECTED Final   Streptococcus pneumoniae NOT DETECTED NOT DETECTED Final   Streptococcus pyogenes NOT DETECTED NOT DETECTED Final   A.calcoaceticus-baumannii NOT DETECTED NOT DETECTED Final   Bacteroides fragilis NOT DETECTED NOT DETECTED Final   Enterobacterales NOT DETECTED NOT DETECTED Final   Enterobacter cloacae complex NOT DETECTED NOT DETECTED Final   Escherichia coli NOT DETECTED NOT DETECTED Final   Klebsiella aerogenes NOT DETECTED NOT DETECTED Final    Klebsiella oxytoca NOT DETECTED NOT DETECTED Final   Klebsiella pneumoniae NOT DETECTED NOT DETECTED Final   Proteus species NOT DETECTED NOT DETECTED Final   Salmonella species NOT DETECTED NOT DETECTED Final   Serratia marcescens NOT DETECTED NOT DETECTED Final   Haemophilus influenzae NOT DETECTED NOT DETECTED Final   Neisseria meningitidis NOT DETECTED NOT DETECTED Final   Pseudomonas aeruginosa NOT DETECTED NOT DETECTED Final   Stenotrophomonas maltophilia NOT DETECTED NOT DETECTED Final   Candida albicans NOT DETECTED NOT DETECTED Final   Candida auris NOT DETECTED NOT DETECTED Final   Candida glabrata NOT DETECTED NOT DETECTED Final   Candida krusei NOT DETECTED NOT DETECTED Final   Candida parapsilosis NOT DETECTED NOT DETECTED Final   Candida tropicalis NOT DETECTED NOT DETECTED Final   Cryptococcus neoformans/gattii  NOT DETECTED NOT DETECTED Final   Meth resistant mecA/C and MREJ DETECTED (A) NOT DETECTED Final    Comment: CRITICAL RESULT CALLED TO, READ BACK BY AND VERIFIED WITH: Ardeen Garland, PHARMD AT 0725 ON 09/23/21 BY GM Performed at [facility], [address] [place], [place] [address]   Group A Strep by PCR [facility] Only)     Status: None   Collection Time: 09/22/21  1:40 PM   Specimen: Throat; Sterile Swab  Result Value Ref Range Status   Group A Strep by PCR NOT DETECTED NOT DETECTED Final    Comment: Performed at [facility], [address] [place], [place] [address]  Peritoneal fluid culture w Gram Stain     Status: None (Preliminary result)   Collection Time: 09/22/21  7:29 PM   Specimen: Peritoneal Washings; Peritoneal Fluid  Result Value Ref Range Status   Specimen Description   Final    PERITONEAL Performed at [facility], [address], [place], [place] [address]    Special Requests   Final    NONE Performed at [facility], [address] [place], [place] [address]    Gram Stain   Final    FEW WBC  PRESENT, PREDOMINANTLY MONONUCLEAR NO ORGANISMS SEEN Performed at [facility], [place] [address] [place], [place] [address]    Culture PENDING  Incomplete   Report Status PENDING  Incomplete  Gastrointestinal Panel by PCR , Stool     Status: None   Collection Time: 09/23/21  4:45 AM   Specimen: Stool  Result Value Ref Range Status   Campylobacter species NOT DETECTED NOT DETECTED Final   Plesimonas shigelloides NOT DETECTED NOT DETECTED Final   Salmonella species NOT DETECTED NOT DETECTED Final   Yersinia enterocolitica NOT DETECTED NOT DETECTED Final   Vibrio species NOT DETECTED NOT DETECTED Final   Vibrio cholerae NOT DETECTED NOT DETECTED Final   Enteroaggregative E coli (EAEC) NOT DETECTED NOT DETECTED Final   Enteropathogenic E coli (EPEC) NOT DETECTED NOT DETECTED Final   Enterotoxigenic E coli (ETEC) NOT DETECTED NOT DETECTED Final   Shiga like toxin producing E coli (STEC) NOT DETECTED NOT DETECTED Final   Shigella/Enteroinvasive E coli (EIEC) NOT DETECTED NOT DETECTED Final   Cryptosporidium NOT DETECTED NOT DETECTED Final   Cyclospora cayetanensis NOT DETECTED NOT DETECTED Final   Entamoeba histolytica NOT DETECTED NOT DETECTED Final   Giardia lamblia NOT DETECTED NOT DETECTED Final   Adenovirus F40/41 NOT DETECTED NOT DETECTED Final   Astrovirus NOT DETECTED NOT DETECTED Final   Norovirus GI/GII NOT DETECTED NOT DETECTED Final   Rotavirus A NOT DETECTED NOT DETECTED Final   Sapovirus (I, II, IV, and V) NOT DETECTED NOT DETECTED Final    Comment: Performed at [facility], [address] [place], [place] [address]  C Difficile Quick Screen w PCR reflex     Status: None   Collection Time: 09/23/21  4:45 AM   Specimen: Stool  Result Value Ref Range Status   C Diff antigen NEGATIVE NEGATIVE Final   C Diff toxin NEGATIVE NEGATIVE Final   C Diff interpretation No C. difficile detected.  Final    Comment: Performed at [facility], [address] [place],  [address]    Coagulation Studies: Recent Labs    09/22/21 1332  LABPROT 16.5*  INR 1.3*    Urinalysis: No results for input(s): COLORURINE, LABSPEC, PHURINE, GLUCOSEU, HGBUR, BILIRUBINUR, KETONESUR, PROTEINUR, UROBILINOGEN, NITRITE, LEUKOCYTESUR  in the last 72 hours.  Invalid input(s): APPERANCEUR    Imaging: CT Abdomen Pelvis Wo Contrast  Result Date: 09/22/2021 CLINICAL DATA:  Body aches and congestion with cough and sore throat. EXAM: CT ABDOMEN AND PELVIS WITHOUT CONTRAST TECHNIQUE: Multidetector CT imaging of the abdomen and pelvis was performed following the standard protocol without IV contrast. COMPARISON:  None. FINDINGS: Lower chest: No acute abnormality. Hepatobiliary: No focal liver abnormality is seen. The gallbladder is moderately distended. No gallstones, gallbladder wall thickening, or biliary dilatation. Pancreas: Unremarkable. No pancreatic ductal dilatation or surrounding inflammatory changes. Spleen: Normal in size without focal abnormality. Adrenals/Urinary Tract: Adrenal glands are unremarkable. Kidneys are normal in size, without renal calculi or hydronephrosis. Multiple subcentimeter exophytic cystic appearing areas are seen involving both kidneys. The urinary bladder is partially contracted. Mild to moderate severity diffuse urinary bladder wall thickening is also seen. Stomach/Bowel: Stomach is within normal limits. Appendix appears normal. No evidence of bowel wall thickening, distention, or inflammatory changes. Vascular/Lymphatic: Aortic atherosclerosis. Subcentimeter para-aortic and aortocaval lymph nodes are present. Reproductive: Prostate is unremarkable. Other: No abdominal wall hernia or abnormality. Intact tubing from a peritoneal dialysis catheter is noted with its distal end seen just above the level of the urinary bladder. No abdominopelvic ascites. Musculoskeletal: No acute or significant osseous findings. IMPRESSION: 1. Urinary bladder wall  thickening which may be secondary to mild to moderate severity cystitis. Correlation with urinalysis is recommended. 2. Findings likely representing small renal cysts. Correlation with nonemergent renal ultrasound is recommended. 3. Aortic atherosclerosis. Aortic Atherosclerosis (ICD10-I70.0). Electronically Signed   By: Virgina Norfolk M.D.   On: 09/22/2021 16:51   CT CHEST WO CONTRAST  Result Date: 09/22/2021 CLINICAL DATA:  Cough, persistent EXAM: CT CHEST WITHOUT CONTRAST TECHNIQUE: Multidetector CT imaging of the chest was performed following the standard protocol without IV contrast. COMPARISON:  No prior chest CT FINDINGS: Cardiovascular: Moderate coronary and mild aortic atherosclerotic calcifications. No significant vascular findings. The heart is normal in size. No pericardial effusion. Mediastinum/Nodes: No enlarged mediastinal or axillary lymph nodes. Thyroid gland, trachea, and esophagus demonstrate no significant findings. Lungs/Pleura: No focal pulmonary opacity. No pleural effusion or pneumothorax. Upper Abdomen: Please see same day CT abdomen pelvis. Musculoskeletal: No chest wall mass or suspicious bone lesions identified. IMPRESSION: 1. No acute process in the chest. No etiology is seen for the patient's cough. 2. Aortic Atherosclerosis (ICD10-I70.0). Coronary artery atherosclerosis. Electronically Signed   By: Merilyn Baba M.D.   On: 09/22/2021 17:51   DG Chest Port 1 View  Result Date: 09/22/2021 CLINICAL DATA:  Questionable sepsis - evaluate for abnormality EXAM: PORTABLE CHEST 1 VIEW COMPARISON:  January 31, 2017. FINDINGS: Borderline enlargement of the cardiac silhouette, likely accentuated by AP technique. Both lungs are clear. No visible pleural effusions or pneumothorax. No acute osseous abnormality. IMPRESSION: No evidence of acute cardiopulmonary disease. Electronically Signed   By: Margaretha Sheffield M.D.   On: 09/22/2021 13:49   DG Foot Complete Left  Result Date:  09/22/2021 CLINICAL DATA:  Swelling, concern for osteomyelitis EXAM: LEFT FOOT - COMPLETE 3+ VIEW COMPARISON:  Left foot MRI 02/01/2017, left foot radiograph 01/31/2017 FINDINGS: The patient is status post amputation of most of the fifth toe metatarsal as well as the fifth toe. The remaining portion of the metatarsal is normal in appearance. There is no osseous destruction or erosion in the foot. There is no acute fracture or dislocation. Alignment is normal. The joint spaces are preserved. Vascular calcifications are noted. There is diffuse  soft tissue swelling but no soft tissue gas. IMPRESSION: Postsurgical changes in the foot as above. There is no osseous erosion or destruction identified. There is diffuse soft tissue swelling without soft tissue gas. If there is persistent clinical concern for osteomyelitis, MRI may be considered. Electronically Signed   By: Valetta Mole M.D.   On: 09/22/2021 13:49     Medications:    sodium chloride     vancomycin 1,500 mg (09/23/21 0937)    abacavir  300 mg Oral BID   amLODipine  5 mg Oral Daily   carvedilol  25 mg Oral BID   dolutegravir  50 mg Oral BID   heparin  5,000 Units Subcutaneous Q8H   insulin aspart  0-6 Units Subcutaneous TID WC   lamiVUDine  25 mg Oral QHS   losartan  100 mg Oral Daily   minoxidil  2.5 mg Oral Q2000   And   [START ON 09/24/2021] minoxidil  2.5 mg Oral Once per day on Sun Tue Thu Sat   sodium chloride flush  3 mL Intravenous Q12H   sodium chloride, sodium chloride flush  Assessment/ Plan:  Mr. JAQUAN SADOWSKY is a 50 y.o. black male with end stage renal disease on peritoneal dialysis, HIV on HART, hypertension, diabetes mellitus type II, diabetic neuropathy who is admitted to [facility] on 09/22/2021 on Leukocytosis [D72.829]  [facility] 102.5kg CCPD 9 hours 5 exchanges 2.7 liter fills   End stage renal disease: restart peritoneal dialysis once patient is in a room.   Hypertension: 106/49 - resume  minoxidil, amlodipine, and carvedilol  Anemia with chronic kidney disease: hemoglobin 8.3. normocytic. Mircera as outpatient.   Diabetes mellitus type II with chronic kidney disease: continue glucose control   LOS: 0 Eline Geng 11/16/20229:39 AM

## 2021-09-24 ENCOUNTER — Encounter
Admission: EM | Disposition: A | Discharge status: Home / Self Care | Payer: Self-pay | Source: Home / Self Care | Attending: Internal Medicine

## 2021-09-24 ENCOUNTER — Inpatient Hospital Stay (HOSPITAL_COMMUNITY)
Admit: 2021-09-24 | Discharge: 2021-09-24 | Disposition: A | Discharge status: Home / Self Care | Payer: Medicare Other | Attending: Cardiovascular Disease | Admitting: Cardiovascular Disease

## 2021-09-24 DIAGNOSIS — I96 Gangrene, not elsewhere classified: Secondary | ICD-10-CM | POA: Diagnosis present

## 2021-09-24 DIAGNOSIS — B20 Human immunodeficiency virus [HIV] disease: Secondary | ICD-10-CM | POA: Diagnosis present

## 2021-09-24 DIAGNOSIS — M869 Osteomyelitis, unspecified: Secondary | ICD-10-CM | POA: Diagnosis not present

## 2021-09-24 DIAGNOSIS — E1069 Type 1 diabetes mellitus with other specified complication: Secondary | ICD-10-CM | POA: Diagnosis present

## 2021-09-24 DIAGNOSIS — B9562 Methicillin resistant Staphylococcus aureus infection as the cause of diseases classified elsewhere: Secondary | ICD-10-CM | POA: Diagnosis present

## 2021-09-24 DIAGNOSIS — E1052 Type 1 diabetes mellitus with diabetic peripheral angiopathy with gangrene: Secondary | ICD-10-CM | POA: Diagnosis present

## 2021-09-24 DIAGNOSIS — E440 Moderate protein-calorie malnutrition: Secondary | ICD-10-CM | POA: Diagnosis present

## 2021-09-24 DIAGNOSIS — E10628 Type 1 diabetes mellitus with other skin complications: Secondary | ICD-10-CM | POA: Diagnosis present

## 2021-09-24 DIAGNOSIS — D509 Iron deficiency anemia, unspecified: Secondary | ICD-10-CM | POA: Diagnosis not present

## 2021-09-24 DIAGNOSIS — D72829 Elevated white blood cell count, unspecified: Secondary | ICD-10-CM | POA: Diagnosis present

## 2021-09-24 DIAGNOSIS — I700 Atherosclerosis of aorta: Secondary | ICD-10-CM

## 2021-09-24 DIAGNOSIS — Z833 Family history of diabetes mellitus: Secondary | ICD-10-CM | POA: Diagnosis not present

## 2021-09-24 DIAGNOSIS — L02612 Cutaneous abscess of left foot: Secondary | ICD-10-CM | POA: Diagnosis present

## 2021-09-24 DIAGNOSIS — Z20822 Contact with and (suspected) exposure to covid-19: Secondary | ICD-10-CM | POA: Diagnosis present

## 2021-09-24 DIAGNOSIS — B9561 Methicillin susceptible Staphylococcus aureus infection as the cause of diseases classified elsewhere: Secondary | ICD-10-CM | POA: Diagnosis not present

## 2021-09-24 DIAGNOSIS — I120 Hypertensive chronic kidney disease with stage 5 chronic kidney disease or end stage renal disease: Secondary | ICD-10-CM | POA: Diagnosis present

## 2021-09-24 DIAGNOSIS — E876 Hypokalemia: Secondary | ICD-10-CM | POA: Diagnosis present

## 2021-09-24 DIAGNOSIS — I358 Other nonrheumatic aortic valve disorders: Secondary | ICD-10-CM

## 2021-09-24 DIAGNOSIS — M86172 Other acute osteomyelitis, left ankle and foot: Secondary | ICD-10-CM | POA: Diagnosis present

## 2021-09-24 DIAGNOSIS — Z992 Dependence on renal dialysis: Secondary | ICD-10-CM | POA: Diagnosis not present

## 2021-09-24 DIAGNOSIS — D631 Anemia in chronic kidney disease: Secondary | ICD-10-CM | POA: Diagnosis present

## 2021-09-24 DIAGNOSIS — L03116 Cellulitis of left lower limb: Secondary | ICD-10-CM | POA: Diagnosis present

## 2021-09-24 DIAGNOSIS — R17 Unspecified jaundice: Secondary | ICD-10-CM | POA: Diagnosis not present

## 2021-09-24 DIAGNOSIS — M00072 Staphylococcal arthritis, left ankle and foot: Secondary | ICD-10-CM | POA: Diagnosis present

## 2021-09-24 DIAGNOSIS — L089 Local infection of the skin and subcutaneous tissue, unspecified: Secondary | ICD-10-CM | POA: Diagnosis not present

## 2021-09-24 DIAGNOSIS — R7881 Bacteremia: Secondary | ICD-10-CM | POA: Diagnosis present

## 2021-09-24 DIAGNOSIS — E1022 Type 1 diabetes mellitus with diabetic chronic kidney disease: Secondary | ICD-10-CM | POA: Diagnosis present

## 2021-09-24 DIAGNOSIS — E1042 Type 1 diabetes mellitus with diabetic polyneuropathy: Secondary | ICD-10-CM | POA: Diagnosis present

## 2021-09-24 DIAGNOSIS — I10 Essential (primary) hypertension: Secondary | ICD-10-CM

## 2021-09-24 DIAGNOSIS — A419 Sepsis, unspecified organism: Secondary | ICD-10-CM | POA: Diagnosis not present

## 2021-09-24 DIAGNOSIS — N186 End stage renal disease: Secondary | ICD-10-CM | POA: Diagnosis present

## 2021-09-24 DIAGNOSIS — Z8249 Family history of ischemic heart disease and other diseases of the circulatory system: Secondary | ICD-10-CM | POA: Diagnosis not present

## 2021-09-24 DIAGNOSIS — N2581 Secondary hyperparathyroidism of renal origin: Secondary | ICD-10-CM | POA: Diagnosis not present

## 2021-09-24 DIAGNOSIS — N2589 Other disorders resulting from impaired renal tubular function: Secondary | ICD-10-CM | POA: Diagnosis not present

## 2021-09-24 DIAGNOSIS — Z79899 Other long term (current) drug therapy: Secondary | ICD-10-CM | POA: Diagnosis not present

## 2021-09-24 DIAGNOSIS — G4733 Obstructive sleep apnea (adult) (pediatric): Secondary | ICD-10-CM | POA: Diagnosis present

## 2021-09-24 DIAGNOSIS — D62 Acute posthemorrhagic anemia: Secondary | ICD-10-CM | POA: Diagnosis present

## 2021-09-24 HISTORY — PX: TEE WITHOUT CARDIOVERSION: SHX5443

## 2021-09-24 LAB — CBC
HCT: 24.4 % — ABNORMAL LOW (ref 39.0–52.0)
Hemoglobin: 8.5 g/dL — ABNORMAL LOW (ref 13.0–17.0)
MCH: 28.6 pg (ref 26.0–34.0)
MCHC: 34.8 g/dL (ref 30.0–36.0)
MCV: 82.2 fL (ref 80.0–100.0)
Platelets: 297 10*3/uL (ref 150–400)
RBC: 2.97 MIL/uL — ABNORMAL LOW (ref 4.22–5.81)
RDW: 13.5 % (ref 11.5–15.5)
WBC: 21.4 10*3/uL — ABNORMAL HIGH (ref 4.0–10.5)
nRBC: 0 % (ref 0.0–0.2)

## 2021-09-24 LAB — HELPER T-LYMPH-CD4 (ARMC ONLY)
% CD 4 Pos. Lymph.: 15.9 % — ABNORMAL LOW (ref 30.8–58.5)
Absolute CD 4 Helper: 302 /uL — ABNORMAL LOW (ref 359–1519)
Basophils Absolute: 0.1 10*3/uL (ref 0.0–0.2)
Basos: 0 %
EOS (ABSOLUTE): 0.1 10*3/uL (ref 0.0–0.4)
Eos: 0 %
Hematocrit: 25.6 % — ABNORMAL LOW (ref 37.5–51.0)
Hemoglobin: 9.1 g/dL — ABNORMAL LOW (ref 13.0–17.7)
Immature Grans (Abs): 0.4 10*3/uL — ABNORMAL HIGH (ref 0.0–0.1)
Immature Granulocytes: 2 %
Lymphocytes Absolute: 1.9 10*3/uL (ref 0.7–3.1)
Lymphs: 8 %
MCH: 29.2 pg (ref 26.6–33.0)
MCHC: 35.5 g/dL (ref 31.5–35.7)
MCV: 82 fL (ref 79–97)
Monocytes Absolute: 1.6 10*3/uL — ABNORMAL HIGH (ref 0.1–0.9)
Monocytes: 6 %
Neutrophils Absolute: 21.7 10*3/uL — ABNORMAL HIGH (ref 1.4–7.0)
Neutrophils: 84 %
Platelets: 330 10*3/uL (ref 150–450)
RBC: 3.12 x10E6/uL — ABNORMAL LOW (ref 4.14–5.80)
RDW: 15.1 % (ref 11.6–15.4)
WBC: 25.7 10*3/uL — ABNORMAL HIGH (ref 3.4–10.8)

## 2021-09-24 LAB — HEMOGLOBIN A1C
Hgb A1c MFr Bld: 6.2 % — ABNORMAL HIGH (ref 4.8–5.6)
Mean Plasma Glucose: 131 mg/dL

## 2021-09-24 LAB — BASIC METABOLIC PANEL
Anion gap: 17 — ABNORMAL HIGH (ref 5–15)
BUN: 97 mg/dL — ABNORMAL HIGH (ref 6–20)
CO2: 22 mmol/L (ref 22–32)
Calcium: 8.7 mg/dL — ABNORMAL LOW (ref 8.9–10.3)
Chloride: 94 mmol/L — ABNORMAL LOW (ref 98–111)
Creatinine, Ser: 20.61 mg/dL — ABNORMAL HIGH (ref 0.61–1.24)
GFR, Estimated: 2 mL/min — ABNORMAL LOW (ref >60)
Glucose, Bld: 215 mg/dL — ABNORMAL HIGH (ref 70–99)
Potassium: 3.4 mmol/L — ABNORMAL LOW (ref 3.5–5.1)
Sodium: 133 mmol/L — ABNORMAL LOW (ref 135–145)

## 2021-09-24 LAB — PROTEIN, BODY FLUID (OTHER): Total Protein, Body Fluid Other: 0.2 g/dL

## 2021-09-24 LAB — GLUCOSE, CAPILLARY
Glucose-Capillary: 135 mg/dL — ABNORMAL HIGH (ref 70–99)
Glucose-Capillary: 109 mg/dL — ABNORMAL HIGH (ref 70–99)
Glucose-Capillary: 110 mg/dL — ABNORMAL HIGH (ref 70–99)
Glucose-Capillary: 212 mg/dL — ABNORMAL HIGH (ref 70–99)

## 2021-09-24 SURGERY — ECHOCARDIOGRAM, TRANSESOPHAGEAL
Anesthesia: Moderate Sedation

## 2021-09-24 MED ORDER — CARVEDILOL 6.25 MG PO TABS: 6.2500 mg | ORAL_TABLET | Freq: Two times a day (BID) | ORAL | Status: DC

## 2021-09-24 MED ORDER — MIDAZOLAM HCL 2 MG/2ML IJ SOLN
INTRAMUSCULAR | Status: DC | PRN
  Administered 2021-09-24 (×2): 2 mg via INTRAVENOUS

## 2021-09-24 MED ORDER — SODIUM CHLORIDE FLUSH 0.9 % IV SOLN
INTRAVENOUS | Status: AC
  Filled 2021-09-24: qty 10

## 2021-09-24 MED ORDER — NEPRO/CARBSTEADY PO LIQD
237.0000 mL | Freq: Three times a day (TID) | ORAL | Status: DC
  Administered 2021-09-24: 21:00:00 237 mL via ORAL
  Filled 2021-09-24: qty 237

## 2021-09-24 MED ORDER — DELFLEX-LC/2.5% DEXTROSE 394 MOSM/L IP SOLN
INTRAPERITONEAL | Status: DC
  Filled 2021-09-24 (×14): qty 3000

## 2021-09-24 MED ORDER — FENTANYL CITRATE PF 50 MCG/ML IJ SOSY
PREFILLED_SYRINGE | INTRAMUSCULAR | Status: DC | PRN
  Administered 2021-09-24 (×2): 50 ug via INTRAVENOUS

## 2021-09-24 MED ORDER — BUTAMBEN-TETRACAINE-BENZOCAINE 2-2-14 % EX AERO
INHALATION_SPRAY | CUTANEOUS | Status: AC
  Filled 2021-09-24: qty 5

## 2021-09-24 MED ORDER — SODIUM CHLORIDE 0.9 % IV SOLN: INTRAVENOUS | Status: DC

## 2021-09-24 MED ORDER — LIDOCAINE VISCOUS HCL 2 % MT SOLN
OROMUCOSAL | Status: AC
  Filled 2021-09-24: qty 15

## 2021-09-24 MED ORDER — SODIUM CHLORIDE 0.9 % IV SOLN: Freq: Once | INTRAVENOUS | Status: AC

## 2021-09-24 MED ORDER — RENA-VITE PO TABS
1.0000 | ORAL_TABLET | Freq: Every day | ORAL | Status: DC
  Administered 2021-09-25 – 2021-10-05 (×11): 1 via ORAL
  Filled 2021-09-24 (×11): qty 1

## 2021-09-24 MED ORDER — FENTANYL CITRATE (PF) 100 MCG/2ML IJ SOLN
INTRAMUSCULAR | Status: AC
  Filled 2021-09-24: qty 2

## 2021-09-24 MED ORDER — CARVEDILOL 12.5 MG PO TABS
12.5000 mg | ORAL_TABLET | Freq: Two times a day (BID) | ORAL | Status: DC
  Administered 2021-09-24 – 2021-09-29 (×10): 12.5 mg via ORAL
  Filled 2021-09-24 (×10): qty 1

## 2021-09-24 MED ORDER — MIDAZOLAM HCL 2 MG/2ML IJ SOLN
INTRAMUSCULAR | Status: AC
  Filled 2021-09-24: qty 4

## 2021-09-24 MED ORDER — DELFLEX-LC/1.5% DEXTROSE 344 MOSM/L IP SOLN
INTRAPERITONEAL | Status: DC
  Filled 2021-09-24 (×13): qty 3000

## 2021-09-24 NOTE — Progress Notes (Signed)
Patient connected to peritoneal dialysis via [id number] without complications. Catheter care completed. Dialysate mixed with 2.5% and 1.5% per Dr.Kolluru.

## 2021-09-24 NOTE — Progress Notes (Addendum)
Initial Nutrition Assessment  DOCUMENTATION CODES:   Not applicable  INTERVENTION:   Nepro Shake po TID, each supplement provides 425 kcal and 19 grams protein  Rena-vit po daily   NUTRITION DIAGNOSIS:   Increased nutrient needs related to chronic illness (ESRD on PD, HIV) as evidenced by estimated needs.  GOAL:   Patient will meet greater than or equal to 90% of their needs  MONITOR:   PO intake, Supplement acceptance, Labs, Weight trends, Skin, I & O's  REASON FOR ASSESSMENT:   Malnutrition Screening Tool    ASSESSMENT:   50 y/o male with h/o anxiety, ESRD on PD, OSA, DM, HIV and HTN who is admitted with sepsis and MRSA bacteremia.  Unable to see pt today as pt in procedure at time of RD visit. Suspect pt with decreased oral intake pta r/t acute illness. Pt NPO today for TEE. RD will add supplements and vitamins to help pt meet his estimated needs and to replace losses from PD. Per chart, pt appears weight stable pta. RD will obtain nutrition related history and exam at follow-up. Of note, pt noted to be lactose intolerant.    Medications reviewed and include: heparin, insulin, vancomycin   Labs reviewed: Na 133(L), K 3.4(L), BUN 97(H), creat 20.61(H) Wbc- 21.4(H), Hgb 8.5(L), Hct 24.4(L) Cbgs- 109, 135 x 24 hrs AIC 6.2(H)- 11/15  NUTRITION - FOCUSED PHYSICAL EXAM: Unable to perform at this time   Diet Order:   Diet Order             Diet NPO time specified  Diet effective now                  EDUCATION NEEDS:   Not appropriate for education at this time  Skin:  Skin Assessment: Reviewed RN Assessment  Last BM:  11/16  Height:   Ht Readings from Last 1 Encounters:  09/24/21 6' 6"  (1.981 m)    Weight:   Wt Readings from Last 1 Encounters:  09/24/21 104.3 kg    Ideal Body Weight:  97.2 kg  BMI:  Body mass index is 26.58 kg/m.  Estimated Nutritional Needs:   Kcal:  2700-3000kcal/day  Protein:  >135g/day  Fluid:   3.0-3.3L/day  Koleen Distance MS, RD, LDN Please refer to [facility] for RD and/or RD on-call/weekend/after hours pager

## 2021-09-24 NOTE — Progress Notes (Signed)
PROGRESS NOTE    Nathan Ayers  WKM:[id number] DOB: [birth year] DOA: 09/22/2021 PCP: Judeth Cornfield, MD   Chief Complaint  Patient presents with   URI    Brief Narrative:   Nathan Ayers is a 50 y.o. male with medical history significant for HIV compliant w/ antiretrovirals, t1dm, ESRD on peritoneal dialysis, OSA not on cpap, and htn, who presents with the above.   Symptoms began about 2 weeks ago. Developed throat pain and went to a local ED/urgent care where was diagnosed with strep throat and given penicillin. Throat pain has resolved, has no pain or difficulty with swallowing. But has developed subjective fever (now resolved) and chills that continue. Also nausea with one episode of vomiting yesterday. Also loose non-bloody stools. Also mild cough and subjective shortness of breath. Does not make urine. No headache or ear ache. Compliant with antiretrovirals. No skin lesions. No abdominal pain   Assessment & Plan:   Principal Problem:   Leukocytosis Active Problems:   Malnutrition of moderate degree   Type 1 diabetes (HCC)   Essential hypertension   HIV disease (HCC)   ESRD on dialysis (HCC)   OSA (obstructive sleep apnea)  MRSA Bacteremia -Patient with leukocytosis, feeling ill, chills, generalized body ache. -2/3 blood culture growing gram-positive cocci -BCID significant for staph aureus -Continue with IV vancomycin -Follow on peritoneal fluid culture and Gram stain, so far no growth -2D echo with no evidence of vegetations, as well TEE has been done by Dr. Rockey Situ this morning with no evidence of vegetation as well -ID consulted  Hypertension -Blood pressure soft this morning, he is on multiple medications, I will discontinue Norvasc, losartan, and decrease Coreg from 25 > 0.5 mg p.o. twice daily, will continue with minoxidil for now, but if blood pressure remains soft then will decrease.   ESRD - On peritoneal dialysis says is compliant w/ that - nephrology  consulted   OSA Not on cpap at home   T1DM Says not on insulin since dialysis started - SSI very sensitive    HIV Says compliant w/ meds. Followed by [facility]. Viral load undetectable in September of this year. Cd4 457 in march of this year - continue ziagen, tivicay, epivir - f/u cd4   DVT prophylaxis: Heparin Code Status: Full Family Communication: father at bedside Disposition:   Status is: inpatient  The patient will require care spanning > 2 midnights and should be moved to inpatient because: Bacteremia and need of IV antibiotics      Consultants:  Renal ID  Procedures -TEE 11/17.  Subjective:  Report generalized weakness, he denies any fever, chills, abdominal pain today, no dyspnea or chest pain.  Objective: Vitals:   09/24/21 1514 09/24/21 1515 09/24/21 1530 09/24/21 1558  BP:   (!) 86/46 120/62  Pulse:  81 76 77  Resp: 20 19 19 16   Temp:      TempSrc:      SpO2:  96% 97% 99%  Weight:      Height:        Intake/Output Summary (Last 24 hours) at 09/24/2021 1624 Last data filed at 09/23/2021 1800 Gross per 24 hour  Intake 120 ml  Output --  Net 120 ml   Filed Weights   09/22/21 1123 09/24/21 1333  Weight: 104.3 kg 104.3 kg    Examination:  Awake Alert, Oriented X 3, No new F.N deficits, Normal affect Symmetrical Chest wall movement, Good air movement bilaterally, CTAB RRR,No Gallops,Rubs , No Parasternal  Heave +ve B.Sounds, Abd Soft, No tenderness, No rebound - guarding or rigidity. No Cyanosis, Clubbing or edema, left foot wrapped      Data Reviewed: I have personally reviewed following labs and imaging studies  CBC: Recent Labs  Lab 09/22/21 1332 09/23/21 0445 09/23/21 0643 09/24/21 0552  WBC 25.9* 29.9* 25.7* 21.4*  NEUTROABS 20.7*  --  21.7*  --   HGB 9.2* 8.3* 9.1* 8.5*  HCT 26.2* 23.9* 25.6* 24.4*  MCV 84.8 84.5 82 82.2  PLT 303 334 330 782    Basic Metabolic Panel: Recent Labs  Lab 09/22/21 1332 09/23/21 0445  09/24/21 0552  NA 132* 134* 133*  K 4.4 4.5 3.4*  CL 91* 92* 94*  CO2 20* 22 22  GLUCOSE 122* 147* 215*  BUN 113* 106* 97*  CREATININE 23.91* 24.45* 20.61*  CALCIUM 9.3 9.3 8.7*    GFR: Estimated Creatinine Clearance: 5.7 mL/min (A) (by C-G formula based on SCr of 20.61 mg/dL (H)).  Liver Function Tests: Recent Labs  Lab 09/22/21 1332 09/23/21 0445  AST 12* 14*  ALT 15 14  ALKPHOS 73 73  BILITOT 1.0 0.7  PROT 8.5* 7.9  ALBUMIN 2.6* 2.4*    CBG: Recent Labs  Lab 09/23/21 1142 09/23/21 1616 09/23/21 2233 09/24/21 0842 09/24/21 1136  GLUCAP 96 101* 181* 135* 109*     Recent Results (from the past 240 hour(s))  Resp Panel by RT-PCR (Flu A&B, Covid) Nasopharyngeal Swab     Status: None   Collection Time: 09/22/21 11:30 AM   Specimen: Nasopharyngeal Swab; Nasopharyngeal(NP) swabs in vial transport medium  Result Value Ref Range Status   SARS Coronavirus 2 by RT PCR NEGATIVE NEGATIVE Final    Comment: (NOTE) SARS-CoV-2 target nucleic acids are NOT DETECTED.  The SARS-CoV-2 RNA is generally detectable in upper respiratory specimens during the acute phase of infection. The lowest concentration of SARS-CoV-2 viral copies this assay can detect is 138 copies/mL. A negative result does not preclude SARS-Cov-2 infection and should not be used as the sole basis for treatment or other patient management decisions. A negative result may occur with  improper specimen collection/handling, submission of specimen other than nasopharyngeal swab, presence of viral mutation(s) within the areas targeted by this assay, and inadequate number of viral copies(<138 copies/mL). A negative result must be combined with clinical observations, patient history, and epidemiological information. The expected result is Negative.  Fact Sheet for Patients:  EntrepreneurPulse.com.au  Fact Sheet for Healthcare Providers:  IncredibleEmployment.be  This test  is no t yet approved or cleared by the [place] FDA and  has been authorized for detection and/or diagnosis of SARS-CoV-2 by FDA under an Emergency Use Authorization (EUA). This EUA will remain  in effect (meaning this test can be used) for the duration of the COVID-19 declaration under Section 564(b)(1) of the Act, 21 U.S.C.section 360bbb-3(b)(1), unless the authorization is terminated  or revoked sooner.       Influenza A by PCR NEGATIVE NEGATIVE Final   Influenza B by PCR NEGATIVE NEGATIVE Final    Comment: (NOTE) The Xpert Xpress SARS-CoV-2/FLU/RSV plus assay is intended as an aid in the diagnosis of influenza from Nasopharyngeal swab specimens and should not be used as a sole basis for treatment. Nasal washings and aspirates are unacceptable for Xpert Xpress SARS-CoV-2/FLU/RSV testing.  Fact Sheet for Patients: EntrepreneurPulse.com.au  Fact Sheet for Healthcare Providers: IncredibleEmployment.be  This test is not yet approved or cleared by the [place] and has been authorized  for detection and/or diagnosis of SARS-CoV-2 by FDA under an Emergency Use Authorization (EUA). This EUA will remain in effect (meaning this test can be used) for the duration of the COVID-19 declaration under Section 564(b)(1) of the Act, 21 U.S.C. section 360bbb-3(b)(1), unless the authorization is terminated or revoked.  Performed at [facility], [place], [place] [address]   Respiratory (~20 pathogens) panel by PCR     Status: None   Collection Time: 09/22/21 11:30 AM   Specimen: Peritoneal Washings; Respiratory  Result Value Ref Range Status   Adenovirus NOT DETECTED NOT DETECTED Final   Coronavirus 229E NOT DETECTED NOT DETECTED Final    Comment: (NOTE) The Coronavirus on the Respiratory Panel, DOES NOT test for the novel  Coronavirus (2019 nCoV)    Coronavirus HKU1 NOT DETECTED NOT DETECTED Final   Coronavirus  NL63 NOT DETECTED NOT DETECTED Final   Coronavirus OC43 NOT DETECTED NOT DETECTED Final   Metapneumovirus NOT DETECTED NOT DETECTED Final   Rhinovirus / Enterovirus NOT DETECTED NOT DETECTED Final   Influenza A NOT DETECTED NOT DETECTED Final   Influenza B NOT DETECTED NOT DETECTED Final   Parainfluenza Virus 1 NOT DETECTED NOT DETECTED Final   Parainfluenza Virus 2 NOT DETECTED NOT DETECTED Final   Parainfluenza Virus 3 NOT DETECTED NOT DETECTED Final   Parainfluenza Virus 4 NOT DETECTED NOT DETECTED Final   Respiratory Syncytial Virus NOT DETECTED NOT DETECTED Final   Bordetella pertussis NOT DETECTED NOT DETECTED Final   Bordetella Parapertussis NOT DETECTED NOT DETECTED Final   Chlamydophila pneumoniae NOT DETECTED NOT DETECTED Final   Mycoplasma pneumoniae NOT DETECTED NOT DETECTED Final    Comment: Performed at [facility], [place]. [address], [place], [place] [address]  Blood Culture (routine x 2)     Status: None (Preliminary result)   Collection Time: 09/22/21  1:32 PM   Specimen: BLOOD  Result Value Ref Range Status   Specimen Description   Final    BLOOD LEFT ANTECUBITAL Performed at [facility], [address] [place], [place] [address]    Special Requests   Final    BOTTLES DRAWN AEROBIC AND ANAEROBIC Blood Culture results may not be optimal due to an excessive volume of blood received in culture bottles Performed at [facility], [address], [place], [place] [address]    Culture  Setup Time AEROBIC BOTTLE ONLY  Final   Culture   Final    NO GROWTH 2 DAYS Performed at [facility], [address], [place], [place] [address]    Report Status PENDING  Incomplete  Blood Culture (routine x 2)     Status: Abnormal (Preliminary result)   Collection Time: 09/22/21  1:32 PM   Specimen: BLOOD  Result Value Ref Range Status   Specimen Description   Final    BLOOD LEFT ANTECUBITAL Performed at [facility], [address], [place], [place] [address]    Special Requests   Final    BOTTLES DRAWN AEROBIC AND ANAEROBIC Blood Culture adequate volume Performed at [facility], [address] [place], [place] [address]    Culture  Setup Time   Final    GRAM POSITIVE COCCI IN BOTH AEROBIC AND ANAEROBIC BOTTLES Organism ID to follow CRITICAL RESULT CALLED TO, READ BACK BY AND VERIFIED WITH: Ardeen Garland, PHARMD AT [place] 09/23/21 BY GM Performed at [facility], [address] [place], [place] [address]  Culture (A)  Final    STAPHYLOCOCCUS AUREUS SUSCEPTIBILITIES TO FOLLOW Performed at [facility], [place] [address] [place], [place] [address]    Report Status PENDING  Incomplete  Blood Culture ID Panel (Reflexed)     Status: Abnormal   Collection Time: 09/22/21  1:32 PM  Result Value Ref Range Status   Enterococcus faecalis NOT DETECTED NOT DETECTED Final   Enterococcus Faecium NOT DETECTED NOT DETECTED Final   Listeria monocytogenes NOT DETECTED NOT DETECTED Final   Staphylococcus species DETECTED (A) NOT DETECTED Final    Comment: CRITICAL RESULT CALLED TO, READ BACK BY AND VERIFIED WITH: MORGAN HICKS, PHARMD AT 0725 ON 09/23/21 BY GM    Staphylococcus aureus (BCID) DETECTED (A) NOT DETECTED Final    Comment: Methicillin (oxacillin)-resistant Staphylococcus aureus (MRSA). MRSA is predictably resistant to beta-lactam antibiotics (except ceftaroline). Preferred therapy is vancomycin unless clinically contraindicated. Patient requires contact precautions if  hospitalized. CRITICAL RESULT CALLED TO, READ BACK BY AND VERIFIED WITH: MORGAN HICKS, PHARMD AT 0725 ON 09/23/21 BY GM    Staphylococcus epidermidis NOT DETECTED NOT DETECTED Final   Staphylococcus lugdunensis NOT DETECTED NOT DETECTED Final   Streptococcus species NOT DETECTED NOT DETECTED Final   Streptococcus agalactiae NOT DETECTED NOT DETECTED Final   Streptococcus pneumoniae NOT DETECTED NOT DETECTED Final    Streptococcus pyogenes NOT DETECTED NOT DETECTED Final   A.calcoaceticus-baumannii NOT DETECTED NOT DETECTED Final   Bacteroides fragilis NOT DETECTED NOT DETECTED Final   Enterobacterales NOT DETECTED NOT DETECTED Final   Enterobacter cloacae complex NOT DETECTED NOT DETECTED Final   Escherichia coli NOT DETECTED NOT DETECTED Final   Klebsiella aerogenes NOT DETECTED NOT DETECTED Final   Klebsiella oxytoca NOT DETECTED NOT DETECTED Final   Klebsiella pneumoniae NOT DETECTED NOT DETECTED Final   Proteus species NOT DETECTED NOT DETECTED Final   Salmonella species NOT DETECTED NOT DETECTED Final   Serratia marcescens NOT DETECTED NOT DETECTED Final   Haemophilus influenzae NOT DETECTED NOT DETECTED Final   Neisseria meningitidis NOT DETECTED NOT DETECTED Final   Pseudomonas aeruginosa NOT DETECTED NOT DETECTED Final   Stenotrophomonas maltophilia NOT DETECTED NOT DETECTED Final   Candida albicans NOT DETECTED NOT DETECTED Final   Candida auris NOT DETECTED NOT DETECTED Final   Candida glabrata NOT DETECTED NOT DETECTED Final   Candida krusei NOT DETECTED NOT DETECTED Final   Candida parapsilosis NOT DETECTED NOT DETECTED Final   Candida tropicalis NOT DETECTED NOT DETECTED Final   Cryptococcus neoformans/gattii NOT DETECTED NOT DETECTED Final   Meth resistant mecA/C and MREJ DETECTED (A) NOT DETECTED Final    Comment: CRITICAL RESULT CALLED TO, READ BACK BY AND VERIFIED WITH: Ardeen Garland, PHARMD AT 0725 ON 09/23/21 BY GM Performed at [facility], [place], [place] [address]   Group A Strep by PCR [facility] Only)     Status: None   Collection Time: 09/22/21  1:40 PM   Specimen: Throat; Sterile Swab  Result Value Ref Range Status   Group A Strep by PCR NOT DETECTED NOT DETECTED Final    Comment: Performed at [facility], [place] [place], [place] [address]  Peritoneal fluid culture w Gram Stain     Status: None (Preliminary result)   Collection  Time: 09/22/21  7:29 PM   Specimen: Peritoneal Washings; Peritoneal Fluid  Result Value Ref Range Status   Specimen Description   Final    PERITONEAL Performed at [facility], [place],  [place], [place] [address]    Special Requests   Final    NONE Performed at [facility], [place], [place] [address]    Gram Stain   Final    FEW WBC PRESENT, PREDOMINANTLY MONONUCLEAR NO ORGANISMS SEEN    Culture   Final    NO GROWTH 2 DAYS Performed at [facility] [address], [place], [place] [address]    Report Status PENDING  Incomplete  Gastrointestinal Panel by PCR , Stool     Status: None   Collection Time: 09/23/21  4:45 AM   Specimen: Stool  Result Value Ref Range Status   Campylobacter species NOT DETECTED NOT DETECTED Final   Plesimonas shigelloides NOT DETECTED NOT DETECTED Final   Salmonella species NOT DETECTED NOT DETECTED Final   Yersinia enterocolitica NOT DETECTED NOT DETECTED Final   Vibrio species NOT DETECTED NOT DETECTED Final   Vibrio cholerae NOT DETECTED NOT DETECTED Final   Enteroaggregative E coli (EAEC) NOT DETECTED NOT DETECTED Final   Enteropathogenic E coli (EPEC) NOT DETECTED NOT DETECTED Final   Enterotoxigenic E coli (ETEC) NOT DETECTED NOT DETECTED Final   Shiga like toxin producing E coli (STEC) NOT DETECTED NOT DETECTED Final   Shigella/Enteroinvasive E coli (EIEC) NOT DETECTED NOT DETECTED Final   Cryptosporidium NOT DETECTED NOT DETECTED Final   Cyclospora cayetanensis NOT DETECTED NOT DETECTED Final   Entamoeba histolytica NOT DETECTED NOT DETECTED Final   Giardia lamblia NOT DETECTED NOT DETECTED Final   Adenovirus F40/41 NOT DETECTED NOT DETECTED Final   Astrovirus NOT DETECTED NOT DETECTED Final   Norovirus GI/GII NOT DETECTED NOT DETECTED Final   Rotavirus A NOT DETECTED NOT DETECTED Final   Sapovirus (I, II, IV, and V) NOT DETECTED NOT DETECTED Final    Comment: Performed at [facility], [address] [place], [place] [address]  C Difficile Quick Screen w PCR reflex     Status: None   Collection Time: 09/23/21  4:45 AM   Specimen: Stool  Result Value Ref Range Status   C Diff antigen NEGATIVE NEGATIVE Final   C Diff toxin NEGATIVE NEGATIVE Final   C Diff interpretation No C. difficile detected.  Final    Comment: Performed at [facility], [address], [place], [place] [address]         Radiology Studies: CT Abdomen Pelvis Wo Contrast  Result Date: 09/22/2021 CLINICAL DATA:  Body aches and congestion with cough and sore throat. EXAM: CT ABDOMEN AND PELVIS WITHOUT CONTRAST TECHNIQUE: Multidetector CT imaging of the abdomen and pelvis was performed following the standard protocol without IV contrast. COMPARISON:  None. FINDINGS: Lower chest: No acute abnormality. Hepatobiliary: No focal liver abnormality is seen. The gallbladder is moderately distended. No gallstones, gallbladder wall thickening, or biliary dilatation. Pancreas: Unremarkable. No pancreatic ductal dilatation or surrounding inflammatory changes. Spleen: Normal in size without focal abnormality. Adrenals/Urinary Tract: Adrenal glands are unremarkable. Kidneys are normal in size, without renal calculi or hydronephrosis. Multiple subcentimeter exophytic cystic appearing areas are seen involving both kidneys. The urinary bladder is partially contracted. Mild to moderate severity diffuse urinary bladder wall thickening is also seen. Stomach/Bowel: Stomach is within normal limits. Appendix appears normal. No evidence of bowel wall thickening, distention, or inflammatory changes. Vascular/Lymphatic: Aortic atherosclerosis. Subcentimeter para-aortic and aortocaval lymph nodes are present. Reproductive: Prostate is unremarkable. Other: No abdominal wall hernia or abnormality. Intact tubing from a peritoneal dialysis catheter is noted with its distal end  seen just above the level of the urinary bladder. No  abdominopelvic ascites. Musculoskeletal: No acute or significant osseous findings. IMPRESSION: 1. Urinary bladder wall thickening which may be secondary to mild to moderate severity cystitis. Correlation with urinalysis is recommended. 2. Findings likely representing small renal cysts. Correlation with nonemergent renal ultrasound is recommended. 3. Aortic atherosclerosis. Aortic Atherosclerosis (ICD10-I70.0). Electronically Signed   By: Virgina Norfolk M.D.   On: 09/22/2021 16:51   CT CHEST WO CONTRAST  Result Date: 09/22/2021 CLINICAL DATA:  Cough, persistent EXAM: CT CHEST WITHOUT CONTRAST TECHNIQUE: Multidetector CT imaging of the chest was performed following the standard protocol without IV contrast. COMPARISON:  No prior chest CT FINDINGS: Cardiovascular: Moderate coronary and mild aortic atherosclerotic calcifications. No significant vascular findings. The heart is normal in size. No pericardial effusion. Mediastinum/Nodes: No enlarged mediastinal or axillary lymph nodes. Thyroid gland, trachea, and esophagus demonstrate no significant findings. Lungs/Pleura: No focal pulmonary opacity. No pleural effusion or pneumothorax. Upper Abdomen: Please see same day CT abdomen pelvis. Musculoskeletal: No chest wall mass or suspicious bone lesions identified. IMPRESSION: 1. No acute process in the chest. No etiology is seen for the patient's cough. 2. Aortic Atherosclerosis (ICD10-I70.0). Coronary artery atherosclerosis. Electronically Signed   By: Merilyn Baba M.D.   On: 09/22/2021 17:51   ECHOCARDIOGRAM COMPLETE  Result Date: 09/23/2021    ECHOCARDIOGRAM REPORT   Patient Name:   Nathan Ayers Date of Exam: 09/22/2021 Medical Rec #:  [id number]      Height:       78.0 in Accession #:    [id number]     Weight:       230.0 lb Date of Birth:  04-07-1971      BSA:          2.396 m Patient Age:    42 years       BP:           119/55 mmHg Patient Gender: M              HR:           91 bpm. Exam Location:   [facility] Procedure: 2D Echo, Cardiac Doppler and Color Doppler Indications:     R01.1 Murmur  History:         Patient has no prior history of Echocardiogram examinations.                  Risk Factors:Hypertension and Diabetes. HIV Infection. Chronic                  kidney disease. Dyspnea.  Sonographer:     Cresenciano Lick RDCS Referring Phys:  [place] GGEZ Diagnosing Phys: Yolonda Kida MD IMPRESSIONS  1. Left ventricular ejection fraction, by estimation, is >75%. The left ventricle has hyperdynamic function. The left ventricle has no regional wall motion abnormalities. There is mild concentric left ventricular hypertrophy. Left ventricular diastolic parameters are consistent with Grade II diastolic dysfunction (pseudonormalization).  2. Right ventricular systolic function is normal. The right ventricular size is normal.  3. The mitral valve is grossly normal. No evidence of mitral valve regurgitation.  4. The aortic valve is grossly normal. Aortic valve regurgitation is not visualized. FINDINGS  Left Ventricle: Left ventricular ejection fraction, by estimation, is >75%. The left ventricle has hyperdynamic function. The left ventricle has no regional wall motion abnormalities. The left ventricular internal cavity size was normal in size. There is mild concentric left ventricular hypertrophy. Left ventricular  diastolic parameters are consistent with Grade II diastolic dysfunction (pseudonormalization). Right Ventricle: The right ventricular size is normal. No increase in right ventricular wall thickness. Right ventricular systolic function is normal. Left Atrium: Left atrial size was normal in size. Right Atrium: Right atrial size was normal in size. Pericardium: There is no evidence of pericardial effusion. Mitral Valve: The mitral valve is grossly normal. No evidence of mitral valve regurgitation. Tricuspid Valve: The tricuspid valve is grossly normal. Tricuspid valve regurgitation is mild.  Aortic Valve: The aortic valve is grossly normal. Aortic valve regurgitation is not visualized. Aortic valve mean gradient measures 7.0 mmHg. Aortic valve peak gradient measures 11.6 mmHg. Aortic valve area, by VTI measures 3.31 cm. Pulmonic Valve: The pulmonic valve was grossly normal. Pulmonic valve regurgitation is not visualized. Aorta: The ascending aorta was not well visualized. IAS/Shunts: No atrial level shunt detected by color flow Doppler.  LEFT VENTRICLE PLAX 2D LVIDd:         4.40 cm   Diastology LVIDs:         2.50 cm   LV e' medial:    7.09 cm/s LV PW:         1.20 cm   LV E/e' medial:  13.9 LV IVS:        1.20 cm   LV e' lateral:   9.82 cm/s LVOT diam:     1.90 cm   LV E/e' lateral: 10.1 LV SV:         81 LV SV Index:   34 LVOT Area:     2.84 cm  RIGHT VENTRICLE RV Basal diam:  3.80 cm RV S prime:     16.60 cm/s TAPSE (M-mode): 1.6 cm LEFT ATRIUM             Index        RIGHT ATRIUM           Index LA diam:        4.10 cm 1.71 cm/m   RA Area:     14.50 cm LA Vol (A2C):   42.9 ml 17.91 ml/m  RA Volume:   37.10 ml  15.49 ml/m LA Vol (A4C):   35.7 ml 14.90 ml/m LA Biplane Vol: 40.2 ml 16.78 ml/m  AORTIC VALVE AV Area (Vmax):    2.62 cm AV Area (Vmean):   2.77 cm AV Area (VTI):     3.31 cm AV Vmax:           170.00 cm/s AV Vmean:          131.000 cm/s AV VTI:            0.246 m AV Peak Grad:      11.6 mmHg AV Mean Grad:      7.0 mmHg LVOT Vmax:         157.00 cm/s LVOT Vmean:        128.000 cm/s LVOT VTI:          0.287 m LVOT/AV VTI ratio: 1.17  AORTA Ao Root diam: 3.40 cm Ao Asc diam:  3.50 cm MITRAL VALVE MV Area (PHT): 2.60 cm    SHUNTS MV Decel Time: 292 msec    Systemic VTI:  0.29 m MV E velocity: 98.80 cm/s  Systemic Diam: 1.90 cm MV A velocity: 90.70 cm/s MV E/A ratio:  1.09 Dwayne D Callwood MD Electronically signed by Yolonda Kida MD Signature Date/Time: 09/23/2021/1:17:04 PM    Final         Scheduled  Meds:  abacavir  300 mg Oral BID   amLODipine  5 mg Oral Daily    butamben-tetracaine-benzocaine       carvedilol  25 mg Oral BID   dolutegravir  50 mg Oral QHS   feeding supplement (NEPRO CARB STEADY)  237 mL Oral TID BM   fentaNYL       gentamicin cream  1 application Topical Daily   heparin  5,000 Units Subcutaneous Q8H   insulin aspart  0-6 Units Subcutaneous TID WC   lamiVUDine  25 mg Oral QHS   lidocaine       losartan  100 mg Oral Daily   midazolam       minoxidil  2.5 mg Oral Q2000   And   minoxidil  2.5 mg Oral Once per day on Sun Tue Thu Sat   [START ON 09/25/2021] multivitamin  1 tablet Oral Daily   sodium chloride flush  3 mL Intravenous Q12H   sodium chloride flush       vancomycin variable dose per unstable renal function (pharmacist dosing)   Does not apply See admin instructions   Continuous Infusions:  sodium chloride     dialysis solution 2.5% low-MG/low-CA       LOS: 0 days       Phillips Climes, MD [facility]   To contact the attending provider between 7A-7P or the covering provider during after hours 7P-7A, please log into the web site [email or web address] and access using universal [place] password for that web site. If you do not have the password, please call the hospital operator.  09/24/2021, 4:24 PM

## 2021-09-24 NOTE — Progress Notes (Signed)
Vitals pre-peritoneal dialysis connection.

## 2021-09-24 NOTE — Progress Notes (Signed)
*  PRELIMINARY RESULTS* Echocardiogram Echocardiogram Transesophageal has been performed.  Sherrie Sport 09/24/2021, 2:42 PM

## 2021-09-24 NOTE — Progress Notes (Signed)
[facility]  ROUNDING NOTE   Subjective:   [name] was admitted to [facility] on 09/22/2021 for Leukocytosis [D72.829] Uremia [N19] Generalized abdominal pain [R10.84] ESRD on peritoneal dialysis ([place]) [N18.6, Z99.2] Acute sepsis ([place]) [A41.9]  Peritoneal dialysis treatment last night. Tolerated treatment well.   Afebrile  Objective:  Vital signs in last 24 hours:  Temp:  [98.4 F (36.9 C)-99 F (37.2 C)] 98.9 F (37.2 C) (11/17 0841) Pulse Rate:  [81-89] 81 (11/17 1055) Resp:  [16-18] 16 (11/17 0841) BP: (87-140)/(39-71) 100/42 (11/17 1055) SpO2:  [98 %-100 %] 100 % (11/17 0841)  Weight change:  Filed Weights   09/22/21 1123  Weight: 104.3 kg    Intake/Output: I/O last 3 completed shifts: In: 520.1 [P.O.:120; IV Piggyback:400.1] Out: -    Intake/Output this shift:  No intake/output data recorded.  Physical Exam: General: NAD, sitting up in bed  Head: Normocephalic, atraumatic. Moist oral mucosal membranes  Eyes: Anicteric, PERRL  Neck: Supple, trachea midline  Lungs:  Clear to auscultation  Heart: Regular rate and rhythm  Abdomen:  Soft, nontender,   Extremities: No peripheral edema.  Neurologic: Nonfocal, moving all four extremities  Skin: No lesions  Access: PD catheter    Basic Metabolic Panel: Recent Labs  Lab 09/22/21 1332 09/23/21 0445 09/24/21 0552  NA 132* 134* 133*  K 4.4 4.5 3.4*  CL 91* 92* 94*  CO2 20* 22 22  GLUCOSE 122* 147* 215*  BUN 113* 106* 97*  CREATININE 23.91* 24.45* 20.61*  CALCIUM 9.3 9.3 8.7*     Liver Function Tests: Recent Labs  Lab 09/22/21 1332 09/23/21 0445  AST 12* 14*  ALT 15 14  ALKPHOS 73 73  BILITOT 1.0 0.7  PROT 8.5* 7.9  ALBUMIN 2.6* 2.4*    No results for input(s): LIPASE, AMYLASE in the last 168 hours. No results for input(s): AMMONIA in the last 168 hours.  CBC: Recent Labs  Lab 09/22/21 1332 09/23/21 0445 09/23/21 0643 09/24/21 0552  WBC 25.9* 29.9* 25.7* 21.4*   NEUTROABS 20.7*  --  21.7*  --   HGB 9.2* 8.3* 9.1* 8.5*  HCT 26.2* 23.9* 25.6* 24.4*  MCV 84.8 84.5 82 82.2  PLT 303 334 330 297     Cardiac Enzymes: No results for input(s): CKTOTAL, CKMB, CKMBINDEX, TROPONINI in the last 168 hours.  BNP: Invalid input(s): POCBNP  CBG: Recent Labs  Lab 09/23/21 1142 09/23/21 1616 09/23/21 2233 09/24/21 0842 09/24/21 1136  GLUCAP 96 101* 181* 135* 109*     Microbiology: Results for orders placed or performed during the hospital encounter of 09/22/21  Resp Panel by RT-PCR (Flu A&B, Covid) Nasopharyngeal Swab     Status: None   Collection Time: 09/22/21 11:30 AM   Specimen: Nasopharyngeal Swab; Nasopharyngeal(NP) swabs in vial transport medium  Result Value Ref Range Status   SARS Coronavirus 2 by RT PCR NEGATIVE NEGATIVE Final    Comment: (NOTE) SARS-CoV-2 target nucleic acids are NOT DETECTED.  The SARS-CoV-2 RNA is generally detectable in upper respiratory specimens during the acute phase of infection. The lowest concentration of SARS-CoV-2 viral copies this assay can detect is 138 copies/mL. A negative result does not preclude SARS-Cov-2 infection and should not be used as the sole basis for treatment or other patient management decisions. A negative result may occur with  improper specimen collection/handling, submission of specimen other than nasopharyngeal swab, presence of viral mutation(s) within the areas targeted by this assay, and inadequate number of viral copies(<138  copies/mL). A negative result must be combined with clinical observations, patient history, and epidemiological information. The expected result is Negative.  Fact Sheet for Patients:  EntrepreneurPulse.com.au  Fact Sheet for Healthcare Providers:  IncredibleEmployment.be  This test is no t yet approved or cleared by the [place] FDA and  has been authorized for detection and/or diagnosis of SARS-CoV-2 by FDA  under an Emergency Use Authorization (EUA). This EUA will remain  in effect (meaning this test can be used) for the duration of the COVID-19 declaration under Section 564(b)(1) of the Act, 21 U.S.C.section 360bbb-3(b)(1), unless the authorization is terminated  or revoked sooner.       Influenza A by PCR NEGATIVE NEGATIVE Final   Influenza B by PCR NEGATIVE NEGATIVE Final    Comment: (NOTE) The Xpert Xpress SARS-CoV-2/FLU/RSV plus assay is intended as an aid in the diagnosis of influenza from Nasopharyngeal swab specimens and should not be used as a sole basis for treatment. Nasal washings and aspirates are unacceptable for Xpert Xpress SARS-CoV-2/FLU/RSV testing.  Fact Sheet for Patients: EntrepreneurPulse.com.au  Fact Sheet for Healthcare Providers: IncredibleEmployment.be  This test is not yet approved or cleared by the [place] FDA and has been authorized for detection and/or diagnosis of SARS-CoV-2 by FDA under an Emergency Use Authorization (EUA). This EUA will remain in effect (meaning this test can be used) for the duration of the COVID-19 declaration under Section 564(b)(1) of the Act, 21 U.S.C. section 360bbb-3(b)(1), unless the authorization is terminated or revoked.  Performed at [facility], [place], [place] [address]   Respiratory (~20 pathogens) panel by PCR     Status: None   Collection Time: 09/22/21 11:30 AM   Specimen: Peritoneal Washings; Respiratory  Result Value Ref Range Status   Adenovirus NOT DETECTED NOT DETECTED Final   Coronavirus 229E NOT DETECTED NOT DETECTED Final    Comment: (NOTE) The Coronavirus on the Respiratory Panel, DOES NOT test for the novel  Coronavirus (2019 nCoV)    Coronavirus HKU1 NOT DETECTED NOT DETECTED Final   Coronavirus NL63 NOT DETECTED NOT DETECTED Final   Coronavirus OC43 NOT DETECTED NOT DETECTED Final   Metapneumovirus NOT DETECTED NOT DETECTED  Final   Rhinovirus / Enterovirus NOT DETECTED NOT DETECTED Final   Influenza A NOT DETECTED NOT DETECTED Final   Influenza B NOT DETECTED NOT DETECTED Final   Parainfluenza Virus 1 NOT DETECTED NOT DETECTED Final   Parainfluenza Virus 2 NOT DETECTED NOT DETECTED Final   Parainfluenza Virus 3 NOT DETECTED NOT DETECTED Final   Parainfluenza Virus 4 NOT DETECTED NOT DETECTED Final   Respiratory Syncytial Virus NOT DETECTED NOT DETECTED Final   Bordetella pertussis NOT DETECTED NOT DETECTED Final   Bordetella Parapertussis NOT DETECTED NOT DETECTED Final   Chlamydophila pneumoniae NOT DETECTED NOT DETECTED Final   Mycoplasma pneumoniae NOT DETECTED NOT DETECTED Final    Comment: Performed at [facility], [place]. [address] [place], [place] [address]  Blood Culture (routine x 2)     Status: None (Preliminary result)   Collection Time: 09/22/21  1:32 PM   Specimen: BLOOD  Result Value Ref Range Status   Specimen Description   Final    BLOOD LEFT ANTECUBITAL Performed at [facility], [address] [place],  [address]    Special Requests   Final    BOTTLES DRAWN AEROBIC AND ANAEROBIC Blood Culture results may not be optimal due to an excessive volume of blood received in culture  bottles Performed at [facility], [address] [place], [place] [address]    Culture  Setup Time AEROBIC BOTTLE ONLY  Final   Culture   Final    NO GROWTH 2 DAYS Performed at [facility], [address] [place], [place] [address]    Report Status PENDING  Incomplete  Blood Culture (routine x 2)     Status: Abnormal (Preliminary result)   Collection Time: 09/22/21  1:32 PM   Specimen: BLOOD  Result Value Ref Range Status   Specimen Description   Final    BLOOD LEFT ANTECUBITAL Performed at [facility], [address], [place], [place] [address]    Special Requests   Final    BOTTLES DRAWN AEROBIC AND ANAEROBIC Blood Culture adequate volume Performed  at [facility], [address], [place], [place] [address]    Culture  Setup Time   Final    GRAM POSITIVE COCCI IN BOTH AEROBIC AND ANAEROBIC BOTTLES Organism ID to follow CRITICAL RESULT CALLED TO, READ BACK BY AND VERIFIED WITH: Ardeen Garland, PHARMD AT 0725 ON 09/23/21 BY GM Performed at [facility], [address], [place], [place] [address]    Culture (A)  Final    STAPHYLOCOCCUS AUREUS SUSCEPTIBILITIES TO FOLLOW Performed at [facility], [place] [address], [place], [place] [address]    Report Status PENDING  Incomplete  Blood Culture ID Panel (Reflexed)     Status: Abnormal   Collection Time: 09/22/21  1:32 PM  Result Value Ref Range Status   Enterococcus faecalis NOT DETECTED NOT DETECTED Final   Enterococcus Faecium NOT DETECTED NOT DETECTED Final   Listeria monocytogenes NOT DETECTED NOT DETECTED Final   Staphylococcus species DETECTED (A) NOT DETECTED Final    Comment: CRITICAL RESULT CALLED TO, READ BACK BY AND VERIFIED WITH: MORGAN HICKS, PHARMD AT 0725 ON 09/23/21 BY GM    Staphylococcus aureus (BCID) DETECTED (A) NOT DETECTED Final    Comment: Methicillin (oxacillin)-resistant Staphylococcus aureus (MRSA). MRSA is predictably resistant to beta-lactam antibiotics (except ceftaroline). Preferred therapy is vancomycin unless clinically contraindicated. Patient requires contact precautions if  hospitalized. CRITICAL RESULT CALLED TO, READ BACK BY AND VERIFIED WITH: MORGAN HICKS, PHARMD AT 0725 ON 09/23/21 BY GM    Staphylococcus epidermidis NOT DETECTED NOT DETECTED Final   Staphylococcus lugdunensis NOT DETECTED NOT DETECTED Final   Streptococcus species NOT DETECTED NOT DETECTED Final   Streptococcus agalactiae NOT DETECTED NOT DETECTED Final   Streptococcus pneumoniae NOT DETECTED NOT DETECTED Final   Streptococcus pyogenes NOT DETECTED NOT DETECTED Final   A.calcoaceticus-baumannii NOT DETECTED NOT DETECTED Final   Bacteroides fragilis NOT  DETECTED NOT DETECTED Final   Enterobacterales NOT DETECTED NOT DETECTED Final   Enterobacter cloacae complex NOT DETECTED NOT DETECTED Final   Escherichia coli NOT DETECTED NOT DETECTED Final   Klebsiella aerogenes NOT DETECTED NOT DETECTED Final   Klebsiella oxytoca NOT DETECTED NOT DETECTED Final   Klebsiella pneumoniae NOT DETECTED NOT DETECTED Final   Proteus species NOT DETECTED NOT DETECTED Final   Salmonella species NOT DETECTED NOT DETECTED Final   Serratia marcescens NOT DETECTED NOT DETECTED Final   Haemophilus influenzae NOT DETECTED NOT DETECTED Final   Neisseria meningitidis NOT DETECTED NOT DETECTED Final   Pseudomonas aeruginosa NOT DETECTED NOT DETECTED Final   Stenotrophomonas maltophilia NOT DETECTED NOT DETECTED Final   Candida albicans NOT DETECTED NOT DETECTED Final   Candida auris NOT DETECTED NOT DETECTED Final   Candida glabrata  NOT DETECTED NOT DETECTED Final   Candida krusei NOT DETECTED NOT DETECTED Final   Candida parapsilosis NOT DETECTED NOT DETECTED Final   Candida tropicalis NOT DETECTED NOT DETECTED Final   Cryptococcus neoformans/gattii NOT DETECTED NOT DETECTED Final   Meth resistant mecA/C and MREJ DETECTED (A) NOT DETECTED Final    Comment: CRITICAL RESULT CALLED TO, READ BACK BY AND VERIFIED WITH: Ardeen Garland, PHARMD AT 0725 ON 09/23/21 BY GM Performed at [facility], [address] [place], [place] [address]   Group A Strep by PCR [facility] Only)     Status: None   Collection Time: 09/22/21  1:40 PM   Specimen: Throat; Sterile Swab  Result Value Ref Range Status   Group A Strep by PCR NOT DETECTED NOT DETECTED Final    Comment: Performed at [facility], [address] [place], [place] [address]  Peritoneal fluid culture w Gram Stain     Status: None (Preliminary result)   Collection Time: 09/22/21  7:29 PM   Specimen: Peritoneal Washings; Peritoneal Fluid  Result Value Ref Range Status   Specimen Description   Final     PERITONEAL Performed at [facility], [address] [place], [place] [address]    Special Requests   Final    NONE Performed at [facility], [address] [place], [place] [address]    Gram Stain   Final    FEW WBC PRESENT, PREDOMINANTLY MONONUCLEAR NO ORGANISMS SEEN    Culture   Final    NO GROWTH 2 DAYS Performed at [facility], [place] [address] [place], [place] [address]    Report Status PENDING  Incomplete  Gastrointestinal Panel by PCR , Stool     Status: None   Collection Time: 09/23/21  4:45 AM   Specimen: Stool  Result Value Ref Range Status   Campylobacter species NOT DETECTED NOT DETECTED Final   Plesimonas shigelloides NOT DETECTED NOT DETECTED Final   Salmonella species NOT DETECTED NOT DETECTED Final   Yersinia enterocolitica NOT DETECTED NOT DETECTED Final   Vibrio species NOT DETECTED NOT DETECTED Final   Vibrio cholerae NOT DETECTED NOT DETECTED Final   Enteroaggregative E coli (EAEC) NOT DETECTED NOT DETECTED Final   Enteropathogenic E coli (EPEC) NOT DETECTED NOT DETECTED Final   Enterotoxigenic E coli (ETEC) NOT DETECTED NOT DETECTED Final   Shiga like toxin producing E coli (STEC) NOT DETECTED NOT DETECTED Final   Shigella/Enteroinvasive E coli (EIEC) NOT DETECTED NOT DETECTED Final   Cryptosporidium NOT DETECTED NOT DETECTED Final   Cyclospora cayetanensis NOT DETECTED NOT DETECTED Final   Entamoeba histolytica NOT DETECTED NOT DETECTED Final   Giardia lamblia NOT DETECTED NOT DETECTED Final   Adenovirus F40/41 NOT DETECTED NOT DETECTED Final   Astrovirus NOT DETECTED NOT DETECTED Final   Norovirus GI/GII NOT DETECTED NOT DETECTED Final   Rotavirus A NOT DETECTED NOT DETECTED Final   Sapovirus (I, II, IV, and V) NOT DETECTED NOT DETECTED Final    Comment: Performed at [facility], [address] [place], [place] [address]  C Difficile Quick Screen w PCR reflex     Status: None   Collection Time: 09/23/21  4:45  AM   Specimen: Stool  Result Value Ref Range Status   C Diff antigen NEGATIVE NEGATIVE Final   C Diff toxin NEGATIVE NEGATIVE Final   C Diff interpretation No C. difficile detected.  Final    Comment: Performed at [facility],  [place], [place] [address]    Coagulation Studies: Recent Labs    09/22/21 1332  LABPROT 16.5*  INR 1.3*     Urinalysis: No results for input(s): COLORURINE, LABSPEC, PHURINE, GLUCOSEU, HGBUR, BILIRUBINUR, KETONESUR, PROTEINUR, UROBILINOGEN, NITRITE, LEUKOCYTESUR in the last 72 hours.  Invalid input(s): APPERANCEUR    Imaging: CT Abdomen Pelvis Wo Contrast  Result Date: 09/22/2021 CLINICAL DATA:  Body aches and congestion with cough and sore throat. EXAM: CT ABDOMEN AND PELVIS WITHOUT CONTRAST TECHNIQUE: Multidetector CT imaging of the abdomen and pelvis was performed following the standard protocol without IV contrast. COMPARISON:  None. FINDINGS: Lower chest: No acute abnormality. Hepatobiliary: No focal liver abnormality is seen. The gallbladder is moderately distended. No gallstones, gallbladder wall thickening, or biliary dilatation. Pancreas: Unremarkable. No pancreatic ductal dilatation or surrounding inflammatory changes. Spleen: Normal in size without focal abnormality. Adrenals/Urinary Tract: Adrenal glands are unremarkable. Kidneys are normal in size, without renal calculi or hydronephrosis. Multiple subcentimeter exophytic cystic appearing areas are seen involving both kidneys. The urinary bladder is partially contracted. Mild to moderate severity diffuse urinary bladder wall thickening is also seen. Stomach/Bowel: Stomach is within normal limits. Appendix appears normal. No evidence of bowel wall thickening, distention, or inflammatory changes. Vascular/Lymphatic: Aortic atherosclerosis. Subcentimeter para-aortic and aortocaval lymph nodes are present. Reproductive: Prostate is unremarkable. Other: No abdominal wall hernia or  abnormality. Intact tubing from a peritoneal dialysis catheter is noted with its distal end seen just above the level of the urinary bladder. No abdominopelvic ascites. Musculoskeletal: No acute or significant osseous findings. IMPRESSION: 1. Urinary bladder wall thickening which may be secondary to mild to moderate severity cystitis. Correlation with urinalysis is recommended. 2. Findings likely representing small renal cysts. Correlation with nonemergent renal ultrasound is recommended. 3. Aortic atherosclerosis. Aortic Atherosclerosis (ICD10-I70.0). Electronically Signed   By: Virgina Norfolk M.D.   On: 09/22/2021 16:51   CT CHEST WO CONTRAST  Result Date: 09/22/2021 CLINICAL DATA:  Cough, persistent EXAM: CT CHEST WITHOUT CONTRAST TECHNIQUE: Multidetector CT imaging of the chest was performed following the standard protocol without IV contrast. COMPARISON:  No prior chest CT FINDINGS: Cardiovascular: Moderate coronary and mild aortic atherosclerotic calcifications. No significant vascular findings. The heart is normal in size. No pericardial effusion. Mediastinum/Nodes: No enlarged mediastinal or axillary lymph nodes. Thyroid gland, trachea, and esophagus demonstrate no significant findings. Lungs/Pleura: No focal pulmonary opacity. No pleural effusion or pneumothorax. Upper Abdomen: Please see same day CT abdomen pelvis. Musculoskeletal: No chest wall mass or suspicious bone lesions identified. IMPRESSION: 1. No acute process in the chest. No etiology is seen for the patient's cough. 2. Aortic Atherosclerosis (ICD10-I70.0). Coronary artery atherosclerosis. Electronically Signed   By: Merilyn Baba M.D.   On: 09/22/2021 17:51   DG Chest Port 1 View  Result Date: 09/22/2021 CLINICAL DATA:  Questionable sepsis - evaluate for abnormality EXAM: PORTABLE CHEST 1 VIEW COMPARISON:  January 31, 2017. FINDINGS: Borderline enlargement of the cardiac silhouette, likely accentuated by AP technique. Both lungs are  clear. No visible pleural effusions or pneumothorax. No acute osseous abnormality. IMPRESSION: No evidence of acute cardiopulmonary disease. Electronically Signed   By: Margaretha Sheffield M.D.   On: 09/22/2021 13:49   DG Foot Complete Left  Result Date: 09/22/2021 CLINICAL DATA:  Swelling, concern for osteomyelitis EXAM: LEFT FOOT - COMPLETE 3+ VIEW COMPARISON:  Left foot MRI 02/01/2017, left foot radiograph 01/31/2017 FINDINGS: The patient is status post amputation of most of the fifth toe metatarsal as well as  the fifth toe. The remaining portion of the metatarsal is normal in appearance. There is no osseous destruction or erosion in the foot. There is no acute fracture or dislocation. Alignment is normal. The joint spaces are preserved. Vascular calcifications are noted. There is diffuse soft tissue swelling but no soft tissue gas. IMPRESSION: Postsurgical changes in the foot as above. There is no osseous erosion or destruction identified. There is diffuse soft tissue swelling without soft tissue gas. If there is persistent clinical concern for osteomyelitis, MRI may be considered. Electronically Signed   By: Valetta Mole M.D.   On: 09/22/2021 13:49   ECHOCARDIOGRAM COMPLETE  Result Date: 09/23/2021    ECHOCARDIOGRAM REPORT   Patient Name:   Nathan Ayers Date of Exam: 09/22/2021 Medical Rec #:  [id number]      Height:       78.0 in Accession #:    [id number]     Weight:       230.0 lb Date of Birth:  11-07-1971      BSA:          2.396 m Patient Age:    58 years       BP:           119/55 mmHg Patient Gender: M              HR:           91 bpm. Exam Location:  [facility] Procedure: 2D Echo, Cardiac Doppler and Color Doppler Indications:     R01.1 Murmur  History:         Patient has no prior history of Echocardiogram examinations.                  Risk Factors:Hypertension and Diabetes. HIV Infection. Chronic                  kidney disease. Dyspnea.  Sonographer:     Cresenciano Lick RDCS Referring  Phys:  [place] XMIW Diagnosing Phys: Yolonda Kida MD IMPRESSIONS  1. Left ventricular ejection fraction, by estimation, is >75%. The left ventricle has hyperdynamic function. The left ventricle has no regional wall motion abnormalities. There is mild concentric left ventricular hypertrophy. Left ventricular diastolic parameters are consistent with Grade II diastolic dysfunction (pseudonormalization).  2. Right ventricular systolic function is normal. The right ventricular size is normal.  3. The mitral valve is grossly normal. No evidence of mitral valve regurgitation.  4. The aortic valve is grossly normal. Aortic valve regurgitation is not visualized. FINDINGS  Left Ventricle: Left ventricular ejection fraction, by estimation, is >75%. The left ventricle has hyperdynamic function. The left ventricle has no regional wall motion abnormalities. The left ventricular internal cavity size was normal in size. There is mild concentric left ventricular hypertrophy. Left ventricular diastolic parameters are consistent with Grade II diastolic dysfunction (pseudonormalization). Right Ventricle: The right ventricular size is normal. No increase in right ventricular wall thickness. Right ventricular systolic function is normal. Left Atrium: Left atrial size was normal in size. Right Atrium: Right atrial size was normal in size. Pericardium: There is no evidence of pericardial effusion. Mitral Valve: The mitral valve is grossly normal. No evidence of mitral valve regurgitation. Tricuspid Valve: The tricuspid valve is grossly normal. Tricuspid valve regurgitation is mild. Aortic Valve: The aortic valve is grossly normal. Aortic valve regurgitation is not visualized. Aortic valve mean gradient measures 7.0 mmHg. Aortic valve peak gradient measures 11.6 mmHg. Aortic valve area, by VTI  measures 3.31 cm. Pulmonic Valve: The pulmonic valve was grossly normal. Pulmonic valve regurgitation is not visualized. Aorta: The  ascending aorta was not well visualized. IAS/Shunts: No atrial level shunt detected by color flow Doppler.  LEFT VENTRICLE PLAX 2D LVIDd:         4.40 cm   Diastology LVIDs:         2.50 cm   LV e' medial:    7.09 cm/s LV PW:         1.20 cm   LV E/e' medial:  13.9 LV IVS:        1.20 cm   LV e' lateral:   9.82 cm/s LVOT diam:     1.90 cm   LV E/e' lateral: 10.1 LV SV:         81 LV SV Index:   34 LVOT Area:     2.84 cm  RIGHT VENTRICLE RV Basal diam:  3.80 cm RV S prime:     16.60 cm/s TAPSE (M-mode): 1.6 cm LEFT ATRIUM             Index        RIGHT ATRIUM           Index LA diam:        4.10 cm 1.71 cm/m   RA Area:     14.50 cm LA Vol (A2C):   42.9 ml 17.91 ml/m  RA Volume:   37.10 ml  15.49 ml/m LA Vol (A4C):   35.7 ml 14.90 ml/m LA Biplane Vol: 40.2 ml 16.78 ml/m  AORTIC VALVE AV Area (Vmax):    2.62 cm AV Area (Vmean):   2.77 cm AV Area (VTI):     3.31 cm AV Vmax:           170.00 cm/s AV Vmean:          131.000 cm/s AV VTI:            0.246 m AV Peak Grad:      11.6 mmHg AV Mean Grad:      7.0 mmHg LVOT Vmax:         157.00 cm/s LVOT Vmean:        128.000 cm/s LVOT VTI:          0.287 m LVOT/AV VTI ratio: 1.17  AORTA Ao Root diam: 3.40 cm Ao Asc diam:  3.50 cm MITRAL VALVE MV Area (PHT): 2.60 cm    SHUNTS MV Decel Time: 292 msec    Systemic VTI:  0.29 m MV E velocity: 98.80 cm/s  Systemic Diam: 1.90 cm MV A velocity: 90.70 cm/s MV E/A ratio:  1.09 Dwayne D Callwood MD Electronically signed by Yolonda Kida MD Signature Date/Time: 09/23/2021/1:17:04 PM    Final      Medications:    sodium chloride     sodium chloride     dialysis solution 2.5% low-MG/low-CA      abacavir  300 mg Oral BID   amLODipine  5 mg Oral Daily   carvedilol  25 mg Oral BID   dolutegravir  50 mg Oral QHS   gentamicin cream  1 application Topical Daily   heparin  5,000 Units Subcutaneous Q8H   insulin aspart  0-6 Units Subcutaneous TID WC   lamiVUDine  25 mg Oral QHS   losartan  100 mg Oral Daily    minoxidil  2.5 mg Oral Q2000   And   minoxidil  2.5 mg Oral Once per day on  Sun Tue Thu Sat   sodium chloride flush  3 mL Intravenous Q12H   vancomycin variable dose per unstable renal function (pharmacist dosing)   Does not apply See admin instructions   sodium chloride, sodium chloride flush  Assessment/ Plan:  [name] is a 50 y.o. black male with end stage renal disease on peritoneal dialysis, HIV on HART, hypertension, diabetes mellitus type II, diabetic neuropathy who is admitted to [facility] on 09/22/2021 on Leukocytosis [D72.829] Uremia [N19] Generalized abdominal pain [R10.84] ESRD on peritoneal dialysis ([place]) [N18.6, Z99.2] Acute sepsis ([place]) [A41.9]  [facility] 102.5kg CCPD 9 hours 5 exchanges 2.7 liter fills   End stage renal disease:  - restart peritoneal dialysis  Hypertension: 100/42 - resume minoxidil, amlodipine, and carvedilol  Anemia with chronic kidney disease: hemoglobin 8.5. normocytic. Mircera as outpatient.   Diabetes mellitus type II with chronic kidney disease: continue glucose control  Sepsis with bacteremia: MRSA in blood cultures.  - appreciate ID and cardiology input. TEE for today.    LOS: 0 T[place] Alipio 11/17/20221:31 PM

## 2021-09-24 NOTE — Progress Notes (Signed)
Date of Admission:  09/22/2021      ID: SHENANDOAH VANDERGRIFF is a 50 y.o. male Principal Problem:   Leukocytosis Active Problems:   Malnutrition of moderate degree   Type 1 diabetes ([place])   Essential hypertension   HIV disease ([place])   ESRD on dialysis ([place])   OSA (obstructive sleep apnea)    Subjective: Patient states cough is persisting No diarrhea No fever Never had abdominal pain. Underwent TEE today  Medications:   abacavir  300 mg Oral BID   butamben-tetracaine-benzocaine       carvedilol  12.5 mg Oral BID   dolutegravir  50 mg Oral QHS   feeding supplement (NEPRO CARB STEADY)  237 mL Oral TID BM   fentaNYL       gentamicin cream  1 application Topical Daily   heparin  5,000 Units Subcutaneous Q8H   insulin aspart  0-6 Units Subcutaneous TID WC   lamiVUDine  25 mg Oral QHS   lidocaine       midazolam       minoxidil  2.5 mg Oral Q2000   And   minoxidil  2.5 mg Oral Once per day on Sun Tue Thu Sat   [START ON 09/25/2021] multivitamin  1 tablet Oral Daily   sodium chloride flush  3 mL Intravenous Q12H   sodium chloride flush       vancomycin variable dose per unstable renal function (pharmacist dosing)   Does not apply See admin instructions    Objective: Vital signs in last 24 hours: Temp:  [98.7 F (37.1 C)-99 F (37.2 C)] 98.7 F (37.1 C) (11/17 1818) Pulse Rate:  [76-89] 82 (11/17 1818) Resp:  [12-25] 18 (11/17 1818) BP: (74-132)/(39-78) 122/68 (11/17 1818) SpO2:  [96 %-100 %] 100 % (11/17 1818) Weight:  [104.3 kg] 104.3 kg (11/17 1333)  PHYSICAL EXAM:  General: Alert, cooperative, no distress, appears stated age.  Head: Normocephalic, without obvious abnormality, atraumatic. Eyes: Conjunctivae clear, anicteric sclerae. Pupils are equal ENT Nares normal. No drainage or sinus tenderness. Lips, mucosa, and tongue normal. No Thrush Neck: Supple, symmetrical, no adenopathy, thyroid: non tender no carotid bruit and no JVD. Back: No CVA  tenderness. Lungs: Bilateral air entry. Heart: S1-S2. Abdomen: Soft, non-tender,not distended. Bowel sounds normal. No masses PD catheter present Extremities: Edema feet Skin: No rashes or lesions. Or bruising Lymph: Cervical, supraclavicular normal. Neurologic: Grossly non-focal  Lab Results Recent Labs    09/23/21 0445 09/23/21 0643 09/24/21 0552  WBC 29.9* 25.7* 21.4*  HGB 8.3* 9.1* 8.5*  HCT 23.9* 25.6* 24.4*  NA 134*  --  133*  K 4.5  --  3.4*  CL 92*  --  94*  CO2 22  --  22  BUN 106*  --  97*  CREATININE 24.45*  --  20.61*   Liver Panel Recent Labs    09/22/21 1332 09/23/21 0445  PROT 8.5* 7.9  ALBUMIN 2.6* 2.4*  AST 12* 14*  ALT 15 14  ALKPHOS 73 73  BILITOT 1.0 0.7   Microbiology: MRSA from 09/22/2021 Studies/Results: ECHOCARDIOGRAM COMPLETE  Result Date: 09/23/2021    ECHOCARDIOGRAM REPORT   Patient Name:   Dalbert Garnet Date of Exam: 09/22/2021 Medical Rec #:  [id number]      Height:       78.0 in Accession #:    [id number]     Weight:       230.0 lb Date of Birth:  09-04-1971  BSA:          2.396 m Patient Age:    8 years       BP:           119/55 mmHg Patient Gender: M              HR:           91 bpm. Exam Location:  [facility] Procedure: 2D Echo, Cardiac Doppler and Color Doppler Indications:     R01.1 Murmur  History:         Patient has no prior history of Echocardiogram examinations.                  Risk Factors:Hypertension and Diabetes. HIV Infection. Chronic                  kidney disease. Dyspnea.  Sonographer:     Cresenciano Lick RDCS Referring Phys:  [place] CHEN Diagnosing Phys: Yolonda Kida MD IMPRESSIONS  1. Left ventricular ejection fraction, by estimation, is >75%. The left ventricle has hyperdynamic function. The left ventricle has no regional wall motion abnormalities. There is mild concentric left ventricular hypertrophy. Left ventricular diastolic parameters are consistent with Grade II diastolic dysfunction  (pseudonormalization).  2. Right ventricular systolic function is normal. The right ventricular size is normal.  3. The mitral valve is grossly normal. No evidence of mitral valve regurgitation.  4. The aortic valve is grossly normal. Aortic valve regurgitation is not visualized. FINDINGS  Left Ventricle: Left ventricular ejection fraction, by estimation, is >75%. The left ventricle has hyperdynamic function. The left ventricle has no regional wall motion abnormalities. The left ventricular internal cavity size was normal in size. There is mild concentric left ventricular hypertrophy. Left ventricular diastolic parameters are consistent with Grade II diastolic dysfunction (pseudonormalization). Right Ventricle: The right ventricular size is normal. No increase in right ventricular wall thickness. Right ventricular systolic function is normal. Left Atrium: Left atrial size was normal in size. Right Atrium: Right atrial size was normal in size. Pericardium: There is no evidence of pericardial effusion. Mitral Valve: The mitral valve is grossly normal. No evidence of mitral valve regurgitation. Tricuspid Valve: The tricuspid valve is grossly normal. Tricuspid valve regurgitation is mild. Aortic Valve: The aortic valve is grossly normal. Aortic valve regurgitation is not visualized. Aortic valve mean gradient measures 7.0 mmHg. Aortic valve peak gradient measures 11.6 mmHg. Aortic valve area, by VTI measures 3.31 cm. Pulmonic Valve: The pulmonic valve was grossly normal. Pulmonic valve regurgitation is not visualized. Aorta: The ascending aorta was not well visualized. IAS/Shunts: No atrial level shunt detected by color flow Doppler.  LEFT VENTRICLE PLAX 2D LVIDd:         4.40 cm   Diastology LVIDs:         2.50 cm   LV e' medial:    7.09 cm/s LV PW:         1.20 cm   LV E/e' medial:  13.9 LV IVS:        1.20 cm   LV e' lateral:   9.82 cm/s LVOT diam:     1.90 cm   LV E/e' lateral: 10.1 LV SV:         81 LV SV Index:    34 LVOT Area:     2.84 cm  RIGHT VENTRICLE RV Basal diam:  3.80 cm RV S prime:     16.60 cm/s TAPSE (M-mode): 1.6 cm LEFT ATRIUM  Index        RIGHT ATRIUM           Index LA diam:        4.10 cm 1.71 cm/m   RA Area:     14.50 cm LA Vol (A2C):   42.9 ml 17.91 ml/m  RA Volume:   37.10 ml  15.49 ml/m LA Vol (A4C):   35.7 ml 14.90 ml/m LA Biplane Vol: 40.2 ml 16.78 ml/m  AORTIC VALVE AV Area (Vmax):    2.62 cm AV Area (Vmean):   2.77 cm AV Area (VTI):     3.31 cm AV Vmax:           170.00 cm/s AV Vmean:          131.000 cm/s AV VTI:            0.246 m AV Peak Grad:      11.6 mmHg AV Mean Grad:      7.0 mmHg LVOT Vmax:         157.00 cm/s LVOT Vmean:        128.000 cm/s LVOT VTI:          0.287 m LVOT/AV VTI ratio: 1.17  AORTA Ao Root diam: 3.40 cm Ao Asc diam:  3.50 cm MITRAL VALVE MV Area (PHT): 2.60 cm    SHUNTS MV Decel Time: 292 msec    Systemic VTI:  0.29 m MV E velocity: 98.80 cm/s  Systemic Diam: 1.90 cm MV A velocity: 90.70 cm/s MV E/A ratio:  1.09 Dwayne D Callwood MD Electronically signed by Yolonda Kida MD Signature Date/Time: 09/23/2021/1:17:04 PM    Final    ECHO TEE  Result Date: 09/24/2021    TRANSESOPHOGEAL ECHO REPORT   Patient Name:   Dalbert Garnet Date of Exam: 09/24/2021 Medical Rec #:  [id number]      Height:       78.0 in Accession #:    [id number]     Weight:       230.0 lb Date of Birth:  04-Jun-1971      BSA:          2.396 m Patient Age:    58 years       BP:           100/42 mmHg Patient Gender: M              HR:           81 bpm. Exam Location:  [facility] Procedure: Transesophageal Echo, Cardiac Doppler and Color Doppler Indications:     MRSA bacteremia, endocarditis, aorta atheroma  History:         Patient has prior history of Echocardiogram examinations, most                  recent 09/22/2021. Risk Factors:Hypertension and Diabetes. CKD.  Sonographer:     Sherrie Sport Referring Phys:  [place] Diagnosing Phys: Ida Rogue MD PROCEDURE: After  discussion of the risks and benefits of a TEE, an informed consent was obtained from the patient. TEE procedure time was 30 minutes. The transesophogeal probe was passed without difficulty through the esophogus of the patient. Imaged were obtained with the patient in a left lateral decubitus position. Local oropharyngeal anesthetic was provided with Cetacaine and viscous lidocaine. Sedation performed by performing physician. Patients was under conscious sedation during this procedure. Anesthetic administered: 142mg of Fentanyl, 4.048mof Versed. Image quality was excellent. The patient's vital  signs; including heart rate, blood pressure, and oxygen saturation; remained stable throughout the procedure. The patient developed no complications during the procedure. IMPRESSIONS  1. No valve endocarditis  2. Left ventricular ejection fraction, by estimation, is 60 to 65%. The left ventricle has normal function. The left ventricle has no regional wall motion abnormalities.  3. Right ventricular systolic function is normal. The right ventricular size is normal.  4. No left atrial/left atrial appendage thrombus was detected.  5. The mitral valve is normal in structure. No evidence of mitral valve regurgitation. No evidence of mitral stenosis.  6. The aortic valve is normal in structure. Aortic valve regurgitation is trivial. Aortic valve sclerosis is present, with no evidence of aortic valve stenosis.  7. There is mild (Grade II) atheroma plaque involving the descending aorta.  8. The inferior vena cava is normal in size with greater than 50% respiratory variability, suggesting right atrial pressure of 3 mmHg.  9. Agitated saline contrast bubble study was negative, with no evidence of any interatrial shunt. Conclusion(s)/Recommendation(s): Normal biventricular function without evidence of hemodynamically significant valvular heart disease. FINDINGS  Left Ventricle: Left ventricular ejection fraction, by estimation, is 60 to  65%. The left ventricle has normal function. The left ventricle has no regional wall motion abnormalities. The left ventricular internal cavity size was normal in size. There is  no left ventricular hypertrophy. Right Ventricle: The right ventricular size is normal. No increase in right ventricular wall thickness. Right ventricular systolic function is normal. Left Atrium: Left atrial size was normal in size. No left atrial/left atrial appendage thrombus was detected. Right Atrium: Right atrial size was normal in size. Pericardium: There is no evidence of pericardial effusion. Mitral Valve: The mitral valve is normal in structure. No evidence of mitral valve regurgitation. No evidence of mitral valve stenosis. Tricuspid Valve: The tricuspid valve is normal in structure. Tricuspid valve regurgitation is mild . No evidence of tricuspid stenosis. Aortic Valve: The aortic valve is normal in structure. Aortic valve regurgitation is trivial. Aortic valve sclerosis is present, with no evidence of aortic valve stenosis. Pulmonic Valve: The pulmonic valve was normal in structure. Pulmonic valve regurgitation is not visualized. No evidence of pulmonic stenosis. Aorta: The aortic root is normal in size and structure. There is mild (Grade II) atheroma plaque involving the descending aorta. Venous: The inferior vena cava is normal in size with greater than 50% respiratory variability, suggesting right atrial pressure of 3 mmHg. IAS/Shunts: No atrial level shunt detected by color flow Doppler. Agitated saline contrast was given intravenously to evaluate for intracardiac shunting. Agitated saline contrast bubble study was negative, with no evidence of any interatrial shunt. There  is no evidence of a patent foramen ovale. There is no evidence of an atrial septal defect. Ida Rogue MD Electronically signed by Ida Rogue MD Signature Date/Time: 09/24/2021/6:29:20 PM    Final (Updated)      Assessment/Plan: MRSA  bacteremia. Repeat blood culture will be sent tomorrow 2D echo no vegetation TEE no vegetation On vancomycin End-stage renal disease on peritoneal dialysis.  Fluid analysis insignificant.  Cultures were negative.  No evidence of peritonitis.  The catheter is likely not the source for the MRSA  Recent strep a throat.  Treated with penicillin.  Says the cough still persist.  No throat pain.  Diarrhea antibiotic versus primary Staph aureus infection GI panel and C. difficile negative.  Imodium was given and diarrhea resolved.  Hypertension on amlodipine, carvedilol and losartan  HIV disease followed at  [facility] Viral load undetectable and CD4 is 47.  On abacavir 300 mg p.o. twice daily, dolutegravir 50 mg once a day and Epivir 25 mg once a day.  Diabetes mellitus on insulin. Discussed the management with the patient. Mother at bedside.  HIV status was not discussed in her presence.

## 2021-09-25 ENCOUNTER — Inpatient Hospital Stay: Payer: Medicare Other

## 2021-09-25 ENCOUNTER — Encounter: Payer: Self-pay | Admitting: Cardiovascular Disease

## 2021-09-25 DIAGNOSIS — Z79899 Other long term (current) drug therapy: Secondary | ICD-10-CM | POA: Diagnosis not present

## 2021-09-25 DIAGNOSIS — B20 Human immunodeficiency virus [HIV] disease: Secondary | ICD-10-CM | POA: Diagnosis not present

## 2021-09-25 DIAGNOSIS — R7881 Bacteremia: Secondary | ICD-10-CM | POA: Diagnosis not present

## 2021-09-25 DIAGNOSIS — R17 Unspecified jaundice: Secondary | ICD-10-CM | POA: Diagnosis not present

## 2021-09-25 DIAGNOSIS — D631 Anemia in chronic kidney disease: Secondary | ICD-10-CM | POA: Diagnosis not present

## 2021-09-25 DIAGNOSIS — N2581 Secondary hyperparathyroidism of renal origin: Secondary | ICD-10-CM | POA: Diagnosis not present

## 2021-09-25 DIAGNOSIS — N2589 Other disorders resulting from impaired renal tubular function: Secondary | ICD-10-CM | POA: Diagnosis not present

## 2021-09-25 DIAGNOSIS — Z992 Dependence on renal dialysis: Secondary | ICD-10-CM | POA: Diagnosis not present

## 2021-09-25 DIAGNOSIS — E440 Moderate protein-calorie malnutrition: Secondary | ICD-10-CM | POA: Diagnosis not present

## 2021-09-25 DIAGNOSIS — N186 End stage renal disease: Secondary | ICD-10-CM | POA: Diagnosis not present

## 2021-09-25 DIAGNOSIS — D509 Iron deficiency anemia, unspecified: Secondary | ICD-10-CM | POA: Diagnosis not present

## 2021-09-25 DIAGNOSIS — B9562 Methicillin resistant Staphylococcus aureus infection as the cause of diseases classified elsewhere: Secondary | ICD-10-CM | POA: Diagnosis not present

## 2021-09-25 LAB — CULTURE, BLOOD (ROUTINE X 2): Special Requests: ADEQUATE

## 2021-09-25 LAB — VANCOMYCIN, RANDOM: Vancomycin Rm: 27

## 2021-09-25 LAB — GLUCOSE, CAPILLARY
Glucose-Capillary: 106 mg/dL — ABNORMAL HIGH (ref 70–99)
Glucose-Capillary: 131 mg/dL — ABNORMAL HIGH (ref 70–99)
Glucose-Capillary: 159 mg/dL — ABNORMAL HIGH (ref 70–99)
Glucose-Capillary: 150 mg/dL — ABNORMAL HIGH (ref 70–99)
Glucose-Capillary: 126 mg/dL — ABNORMAL HIGH (ref 70–99)

## 2021-09-25 MED ORDER — ACETAMINOPHEN 325 MG PO TABS
650.0000 mg | ORAL_TABLET | Freq: Four times a day (QID) | ORAL | Status: DC | PRN
  Administered 2021-09-25 – 2021-09-26 (×2): 650 mg via ORAL
  Filled 2021-09-25 (×3): qty 2

## 2021-09-25 MED ORDER — POTASSIUM CHLORIDE CRYS ER 20 MEQ PO TBCR
20.0000 meq | EXTENDED_RELEASE_TABLET | Freq: Every day | ORAL | Status: DC
Start: 2021-09-25 — End: 2021-10-01
  Administered 2021-09-25 – 2021-10-01 (×7): 20 meq via ORAL
  Filled 2021-09-25 (×7): qty 1

## 2021-09-25 NOTE — Progress Notes (Signed)
[facility]  ROUNDING NOTE   Subjective:   Peritoneal dialysis treatment last night. Tolerated treatment well.   Tmax 101.7  TEE yesterday. No signs of valvular endocarditis.   Patient states his nausea has improved.   Mother at bedside.   Objective:  Vital signs in last 24 hours:  Temp:  [98.7 F (37.1 C)-101.7 F (38.7 C)] 100.2 F (37.9 C) (11/18 1305) Pulse Rate:  [76-90] 89 (11/18 1305) Resp:  [12-25] 17 (11/18 1305) BP: (74-139)/(43-78) 125/60 (11/18 1305) SpO2:  [93 %-100 %] 98 % (11/18 1305) Weight:  [104.3 kg] 104.3 kg (11/17 1333)  Weight change:  Filed Weights   09/22/21 1123 09/24/21 1333  Weight: 104.3 kg 104.3 kg    Intake/Output: I/O last 3 completed shifts: In: 370 [P.O.:200; I.V.:170] Out: 0    Intake/Output this shift:  No intake/output data recorded.  Physical Exam: General: NAD, sitting up in bed  Head: Normocephalic, atraumatic. Moist oral mucosal membranes  Eyes: Anicteric, PERRL  Neck: Supple, trachea midline  Lungs:  Clear to auscultation  Heart: Regular rate and rhythm  Abdomen:  Soft, nontender, PD catheter  Extremities: No peripheral edema.  Neurologic: Nonfocal, moving all four extremities  Skin: No lesions  Access: PD catheter, right radiocephalic AVF    Basic Metabolic Panel: Recent Labs  Lab 09/22/21 1332 09/23/21 0445 09/24/21 0552  NA 132* 134* 133*  K 4.4 4.5 3.4*  CL 91* 92* 94*  CO2 20* 22 22  GLUCOSE 122* 147* 215*  BUN 113* 106* 97*  CREATININE 23.91* 24.45* 20.61*  CALCIUM 9.3 9.3 8.7*     Liver Function Tests: Recent Labs  Lab 09/22/21 1332 09/23/21 0445  AST 12* 14*  ALT 15 14  ALKPHOS 73 73  BILITOT 1.0 0.7  PROT 8.5* 7.9  ALBUMIN 2.6* 2.4*    No results for input(s): LIPASE, AMYLASE in the last 168 hours. No results for input(s): AMMONIA in the last 168 hours.  CBC: Recent Labs  Lab 09/22/21 1332 09/23/21 0445 09/23/21 0643 09/24/21 0552  WBC 25.9* 29.9* 25.7* 21.4*   NEUTROABS 20.7*  --  21.7*  --   HGB 9.2* 8.3* 9.1* 8.5*  HCT 26.2* 23.9* 25.6* 24.4*  MCV 84.8 84.5 82 82.2  PLT 303 334 330 297     Cardiac Enzymes: No results for input(s): CKTOTAL, CKMB, CKMBINDEX, TROPONINI in the last 168 hours.  BNP: Invalid input(s): POCBNP  CBG: Recent Labs  Lab 09/24/21 1454 09/24/21 1634 09/24/21 2111 09/25/21 0917 09/25/21 1303  GLUCAP 106* 110* 212* 131* 159*     Microbiology: Results for orders placed or performed during the hospital encounter of 09/22/21  Resp Panel by RT-PCR (Flu A&B, Covid) Nasopharyngeal Swab     Status: None   Collection Time: 09/22/21 11:30 AM   Specimen: Nasopharyngeal Swab; Nasopharyngeal(NP) swabs in vial transport medium  Result Value Ref Range Status   SARS Coronavirus 2 by RT PCR NEGATIVE NEGATIVE Final    Comment: (NOTE) SARS-CoV-2 target nucleic acids are NOT DETECTED.  The SARS-CoV-2 RNA is generally detectable in upper respiratory specimens during the acute phase of infection. The lowest concentration of SARS-CoV-2 viral copies this assay can detect is 138 copies/mL. A negative result does not preclude SARS-Cov-2 infection and should not be used as the sole basis for treatment or other patient management decisions. A negative result may occur with  improper specimen collection/handling, submission of specimen other than nasopharyngeal swab, presence of viral mutation(s) within the areas targeted by this  assay, and inadequate number of viral copies(<138 copies/mL). A negative result must be combined with clinical observations, patient history, and epidemiological information. The expected result is Negative.  Fact Sheet for Patients:  EntrepreneurPulse.com.au  Fact Sheet for Healthcare Providers:  IncredibleEmployment.be  This test is no t yet approved or cleared by the [place] FDA and  has been authorized for detection and/or diagnosis of SARS-CoV-2  by FDA under an Emergency Use Authorization (EUA). This EUA will remain  in effect (meaning this test can be used) for the duration of the COVID-19 declaration under Section 564(b)(1) of the Act, 21 U.S.C.section 360bbb-3(b)(1), unless the authorization is terminated  or revoked sooner.       Influenza A by PCR NEGATIVE NEGATIVE Final   Influenza B by PCR NEGATIVE NEGATIVE Final    Comment: (NOTE) The Xpert Xpress SARS-CoV-2/FLU/RSV plus assay is intended as an aid in the diagnosis of influenza from Nasopharyngeal swab specimens and should not be used as a sole basis for treatment. Nasal washings and aspirates are unacceptable for Xpert Xpress SARS-CoV-2/FLU/RSV testing.  Fact Sheet for Patients: EntrepreneurPulse.com.au  Fact Sheet for Healthcare Providers: IncredibleEmployment.be  This test is not yet approved or cleared by the [place] FDA and has been authorized for detection and/or diagnosis of SARS-CoV-2 by FDA under an Emergency Use Authorization (EUA). This EUA will remain in effect (meaning this test can be used) for the duration of the COVID-19 declaration under Section 564(b)(1) of the Act, 21 U.S.C. section 360bbb-3(b)(1), unless the authorization is terminated or revoked.  Performed at [facility], [place], [place] [address]   Respiratory (~20 pathogens) panel by PCR     Status: None   Collection Time: 09/22/21 11:30 AM   Specimen: Peritoneal Washings; Respiratory  Result Value Ref Range Status   Adenovirus NOT DETECTED NOT DETECTED Final   Coronavirus 229E NOT DETECTED NOT DETECTED Final    Comment: (NOTE) The Coronavirus on the Respiratory Panel, DOES NOT test for the novel  Coronavirus (2019 nCoV)    Coronavirus HKU1 NOT DETECTED NOT DETECTED Final   Coronavirus NL63 NOT DETECTED NOT DETECTED Final   Coronavirus OC43 NOT DETECTED NOT DETECTED Final   Metapneumovirus NOT DETECTED NOT  DETECTED Final   Rhinovirus / Enterovirus NOT DETECTED NOT DETECTED Final   Influenza A NOT DETECTED NOT DETECTED Final   Influenza B NOT DETECTED NOT DETECTED Final   Parainfluenza Virus 1 NOT DETECTED NOT DETECTED Final   Parainfluenza Virus 2 NOT DETECTED NOT DETECTED Final   Parainfluenza Virus 3 NOT DETECTED NOT DETECTED Final   Parainfluenza Virus 4 NOT DETECTED NOT DETECTED Final   Respiratory Syncytial Virus NOT DETECTED NOT DETECTED Final   Bordetella pertussis NOT DETECTED NOT DETECTED Final   Bordetella Parapertussis NOT DETECTED NOT DETECTED Final   Chlamydophila pneumoniae NOT DETECTED NOT DETECTED Final   Mycoplasma pneumoniae NOT DETECTED NOT DETECTED Final    Comment: Performed at [facility], [place], [place], [place] [address]  Blood Culture (routine x 2)     Status: None (Preliminary result)   Collection Time: 09/22/21  1:32 PM   Specimen: BLOOD  Result Value Ref Range Status   Specimen Description BLOOD LEFT ANTECUBITAL  Final   Special Requests   Final    BOTTLES DRAWN AEROBIC AND ANAEROBIC Blood Culture results may not be optimal due to an excessive volume of blood received in culture bottles   Culture  Setup Time   Final  AEROBIC BOTTLE ONLY GRAM POSITIVE COCCI ANAEROBIC BOTTLE ONLY CRITICAL VALUE NOTED.  VALUE IS CONSISTENT WITH PREVIOUSLY REPORTED AND CALLED VALUE. Performed at [facility], [address] [place], [place] [address]    Culture [facility] POSITIVE COCCI  Final   Report Status PENDING  Incomplete  Blood Culture (routine x 2)     Status: Abnormal   Collection Time: 09/22/21  1:32 PM   Specimen: BLOOD  Result Value Ref Range Status   Specimen Description   Final    BLOOD LEFT ANTECUBITAL Performed at [facility], [address], [place], [place] [address]    Special Requests   Final    BOTTLES DRAWN AEROBIC AND ANAEROBIC Blood Culture adequate volume Performed at [facility], [address]  [place], [place] [address]    Culture  Setup Time   Final    GRAM POSITIVE COCCI IN BOTH AEROBIC AND ANAEROBIC BOTTLES Organism ID to follow CRITICAL RESULT CALLED TO, READ BACK BY AND VERIFIED WITH: Ardeen Garland, PHARMD AT 0725 ON 09/23/21 BY GM Performed at [facility], [address] [place], [place] [address]    Culture METHICILLIN RESISTANT STAPHYLOCOCCUS AUREUS (A)  Final   Report Status 09/25/2021 FINAL  Final   Organism ID, Bacteria METHICILLIN RESISTANT STAPHYLOCOCCUS AUREUS  Final      Susceptibility   Methicillin resistant staphylococcus aureus - MIC*    CIPROFLOXACIN 2 INTERMEDIATE Intermediate     ERYTHROMYCIN >=8 RESISTANT Resistant     GENTAMICIN <=0.5 SENSITIVE Sensitive     OXACILLIN >=4 RESISTANT Resistant     TETRACYCLINE <=1 SENSITIVE Sensitive     VANCOMYCIN 1 SENSITIVE Sensitive     TRIMETH/SULFA <=10 SENSITIVE Sensitive     CLINDAMYCIN <=0.25 SENSITIVE Sensitive     RIFAMPIN <=0.5 SENSITIVE Sensitive     Inducible Clindamycin NEGATIVE Sensitive     * METHICILLIN RESISTANT STAPHYLOCOCCUS AUREUS  Blood Culture ID Panel (Reflexed)     Status: Abnormal   Collection Time: 09/22/21  1:32 PM  Result Value Ref Range Status   Enterococcus faecalis NOT DETECTED NOT DETECTED Final   Enterococcus Faecium NOT DETECTED NOT DETECTED Final   Listeria monocytogenes NOT DETECTED NOT DETECTED Final   Staphylococcus species DETECTED (A) NOT DETECTED Final    Comment: CRITICAL RESULT CALLED TO, READ BACK BY AND VERIFIED WITH: MORGAN HICKS, PHARMD AT 0725 ON 09/23/21 BY GM    Staphylococcus aureus (BCID) DETECTED (A) NOT DETECTED Final    Comment: Methicillin (oxacillin)-resistant Staphylococcus aureus (MRSA). MRSA is predictably resistant to beta-lactam antibiotics (except ceftaroline). Preferred therapy is vancomycin unless clinically contraindicated. Patient requires contact precautions if  hospitalized. CRITICAL RESULT CALLED TO, READ BACK BY AND VERIFIED  WITH: MORGAN HICKS, PHARMD AT 0725 ON 09/23/21 BY GM    Staphylococcus epidermidis NOT DETECTED NOT DETECTED Final   Staphylococcus lugdunensis NOT DETECTED NOT DETECTED Final   Streptococcus species NOT DETECTED NOT DETECTED Final   Streptococcus agalactiae NOT DETECTED NOT DETECTED Final   Streptococcus pneumoniae NOT DETECTED NOT DETECTED Final   Streptococcus pyogenes NOT DETECTED NOT DETECTED Final   A.calcoaceticus-baumannii NOT DETECTED NOT DETECTED Final   Bacteroides fragilis NOT DETECTED NOT DETECTED Final   Enterobacterales NOT DETECTED NOT DETECTED Final   Enterobacter cloacae complex NOT DETECTED NOT DETECTED Final   Escherichia coli NOT DETECTED NOT DETECTED Final   Klebsiella aerogenes NOT DETECTED NOT DETECTED Final   Klebsiella oxytoca NOT DETECTED NOT DETECTED Final   Klebsiella pneumoniae NOT DETECTED NOT DETECTED  Final   Proteus species NOT DETECTED NOT DETECTED Final   Salmonella species NOT DETECTED NOT DETECTED Final   Serratia marcescens NOT DETECTED NOT DETECTED Final   Haemophilus influenzae NOT DETECTED NOT DETECTED Final   Neisseria meningitidis NOT DETECTED NOT DETECTED Final   Pseudomonas aeruginosa NOT DETECTED NOT DETECTED Final   Stenotrophomonas maltophilia NOT DETECTED NOT DETECTED Final   Candida albicans NOT DETECTED NOT DETECTED Final   Candida auris NOT DETECTED NOT DETECTED Final   Candida glabrata NOT DETECTED NOT DETECTED Final   Candida krusei NOT DETECTED NOT DETECTED Final   Candida parapsilosis NOT DETECTED NOT DETECTED Final   Candida tropicalis NOT DETECTED NOT DETECTED Final   Cryptococcus neoformans/gattii NOT DETECTED NOT DETECTED Final   Meth resistant mecA/C and MREJ DETECTED (A) NOT DETECTED Final    Comment: CRITICAL RESULT CALLED TO, READ BACK BY AND VERIFIED WITH: Ardeen Garland, PHARMD AT 0725 ON 09/23/21 BY GM Performed at [facility], [place], [place] [address]   Group A Strep by PCR [facility] Only)      Status: None   Collection Time: 09/22/21  1:40 PM   Specimen: Throat; Sterile Swab  Result Value Ref Range Status   Group A Strep by PCR NOT DETECTED NOT DETECTED Final    Comment: Performed at [facility], [address] [place], [place] [address]  Peritoneal fluid culture w Gram Stain     Status: None (Preliminary result)   Collection Time: 09/22/21  7:29 PM   Specimen: Peritoneal Washings; Peritoneal Fluid  Result Value Ref Range Status   Specimen Description   Final    PERITONEAL Performed at [facility], [address] [place], [place] [address]    Special Requests   Final    NONE Performed at [facility], [address] [place], [place] [address]    Gram Stain   Final    FEW WBC PRESENT, PREDOMINANTLY MONONUCLEAR NO ORGANISMS SEEN    Culture   Final    NO GROWTH 3 DAYS Performed at [facility], [place] [address], [place], [place] [address]    Report Status PENDING  Incomplete  Gastrointestinal Panel by PCR , Stool     Status: None   Collection Time: 09/23/21  4:45 AM   Specimen: Stool  Result Value Ref Range Status   Campylobacter species NOT DETECTED NOT DETECTED Final   Plesimonas shigelloides NOT DETECTED NOT DETECTED Final   Salmonella species NOT DETECTED NOT DETECTED Final   Yersinia enterocolitica NOT DETECTED NOT DETECTED Final   Vibrio species NOT DETECTED NOT DETECTED Final   Vibrio cholerae NOT DETECTED NOT DETECTED Final   Enteroaggregative E coli (EAEC) NOT DETECTED NOT DETECTED Final   Enteropathogenic E coli (EPEC) NOT DETECTED NOT DETECTED Final   Enterotoxigenic E coli (ETEC) NOT DETECTED NOT DETECTED Final   Shiga like toxin producing E coli (STEC) NOT DETECTED NOT DETECTED Final   Shigella/Enteroinvasive E coli (EIEC) NOT DETECTED NOT DETECTED Final   Cryptosporidium NOT DETECTED NOT DETECTED Final   Cyclospora cayetanensis NOT DETECTED NOT DETECTED Final   Entamoeba histolytica NOT DETECTED NOT DETECTED  Final   Giardia lamblia NOT DETECTED NOT DETECTED Final   Adenovirus F40/41 NOT DETECTED NOT DETECTED Final   Astrovirus NOT DETECTED NOT DETECTED Final   Norovirus GI/GII NOT DETECTED NOT DETECTED Final   Rotavirus A NOT DETECTED NOT DETECTED Final   Sapovirus (I, II, IV, and V) NOT DETECTED NOT  DETECTED Final    Comment: Performed at [facility], [place], [place] [address]  C Difficile Quick Screen w PCR reflex     Status: None   Collection Time: 09/23/21  4:45 AM   Specimen: Stool  Result Value Ref Range Status   C Diff antigen NEGATIVE NEGATIVE Final   C Diff toxin NEGATIVE NEGATIVE Final   C Diff interpretation No C. difficile detected.  Final    Comment: Performed at [facility], [address] [place], [place] [address]    Coagulation Studies: Recent Labs    09/22/21 1332  LABPROT 16.5*  INR 1.3*     Urinalysis: No results for input(s): COLORURINE, LABSPEC, PHURINE, GLUCOSEU, HGBUR, BILIRUBINUR, KETONESUR, PROTEINUR, UROBILINOGEN, NITRITE, LEUKOCYTESUR in the last 72 hours.  Invalid input(s): APPERANCEUR    Imaging: US Venous Img Lower Unilateral Left (DVT)  Result Date: 09/25/2021 CLINICAL DATA:  Left lower extremity pain for the past 3 weeks. Evaluate for DVT. EXAM: LEFT LOWER EXTREMITY VENOUS DOPPLER ULTRASOUND TECHNIQUE: Gray-scale sonography with graded compression, as well as color Doppler and duplex ultrasound were performed to evaluate the lower extremity deep venous systems from the level of the common femoral vein and including the common femoral, femoral, profunda femoral, popliteal and calf veins including the posterior tibial, peroneal and gastrocnemius veins when visible. The superficial great saphenous vein was also interrogated. Spectral Doppler was utilized to evaluate flow at rest and with distal augmentation maneuvers in the common femoral, femoral and popliteal veins. COMPARISON:  CT the chest, abdomen and  pelvis-09/22/2021 FINDINGS: Contralateral Common Femoral Vein: Respiratory phasicity is normal and symmetric with the symptomatic side. No evidence of thrombus. Normal compressibility. Common Femoral Vein: No evidence of thrombus. Normal compressibility, respiratory phasicity and response to augmentation. Saphenofemoral Junction: No evidence of thrombus. Normal compressibility and flow on color Doppler imaging. Profunda Femoral Vein: No evidence of thrombus. Normal compressibility and flow on color Doppler imaging. Femoral Vein: No evidence of thrombus. Normal compressibility, respiratory phasicity and response to augmentation. Popliteal Vein: No evidence of thrombus. Normal compressibility, respiratory phasicity and response to augmentation. Calf Veins: No evidence of thrombus. Normal compressibility and flow on color Doppler imaging. Superficial Great Saphenous Vein: No evidence of thrombus. Normal compressibility. Venous Reflux:  None. Other Findings: Note is made of mildly prominent though non pathologically enlarged left inguinal lymph nodes with index left inguinal lymph node measuring 1.1 cm in greatest short axis diameter (image 14). Borderline enlarged right inguinal lymph node measures 1.5 cm in greatest short axis diameter with partial thickening on its cortex though maintenance of a benign fatty hilum (image 40). IMPRESSION: 1. No evidence of DVT within the left lower extremity. 2. Prominent bilateral inguinal lymph nodes, right greater than left, similar to recent abdominal CT, nonspecific though presumably reactive in etiology in the setting peritoneal dialysis. Clinical correlation is advised. Electronically Signed   By: Sandi Mariscal M.D.   On: 09/25/2021 12:44   ECHO TEE  Result Date: 09/24/2021    TRANSESOPHOGEAL ECHO REPORT   Patient Name:   Nathan Ayers Date of Exam: 09/24/2021 Medical Rec #:  [id number]      Height:       78.0 in Accession #:    [id number]     Weight:       230.0 lb Date of  Birth:  1970-11-16      BSA:          2.396 m Patient Age:  50 years       BP:           100/42 mmHg Patient Gender: M              HR:           81 bpm. Exam Location:  [facility] Procedure: Transesophageal Echo, Cardiac Doppler and Color Doppler Indications:     MRSA bacteremia, endocarditis, aorta atheroma  History:         Patient has prior history of Echocardiogram examinations, most                  recent 09/22/2021. Risk Factors:Hypertension and Diabetes. CKD.  Sonographer:     Sherrie Sport Referring Phys:  [place] Diagnosing Phys: Ida Rogue MD PROCEDURE: After discussion of the risks and benefits of a TEE, an informed consent was obtained from the patient. TEE procedure time was 30 minutes. The transesophogeal probe was passed without difficulty through the esophogus of the patient. Imaged were obtained with the patient in a left lateral decubitus position. Local oropharyngeal anesthetic was provided with Cetacaine and viscous lidocaine. Sedation performed by performing physician. Patients was under conscious sedation during this procedure. Anesthetic administered: 132mg of Fentanyl, 4.042mof Versed. Image quality was excellent. The patient's vital signs; including heart rate, blood pressure, and oxygen saturation; remained stable throughout the procedure. The patient developed no complications during the procedure. IMPRESSIONS  1. No valve endocarditis  2. Left ventricular ejection fraction, by estimation, is 60 to 65%. The left ventricle has normal function. The left ventricle has no regional wall motion abnormalities.  3. Right ventricular systolic function is normal. The right ventricular size is normal.  4. No left atrial/left atrial appendage thrombus was detected.  5. The mitral valve is normal in structure. No evidence of mitral valve regurgitation. No evidence of mitral stenosis.  6. The aortic valve is normal in structure. Aortic valve regurgitation is trivial. Aortic valve sclerosis  is present, with no evidence of aortic valve stenosis.  7. There is mild (Grade II) atheroma plaque involving the descending aorta.  8. The inferior vena cava is normal in size with greater than 50% respiratory variability, suggesting right atrial pressure of 3 mmHg.  9. Agitated saline contrast bubble study was negative, with no evidence of any interatrial shunt. Conclusion(s)/Recommendation(s): Normal biventricular function without evidence of hemodynamically significant valvular heart disease. FINDINGS  Left Ventricle: Left ventricular ejection fraction, by estimation, is 60 to 65%. The left ventricle has normal function. The left ventricle has no regional wall motion abnormalities. The left ventricular internal cavity size was normal in size. There is  no left ventricular hypertrophy. Right Ventricle: The right ventricular size is normal. No increase in right ventricular wall thickness. Right ventricular systolic function is normal. Left Atrium: Left atrial size was normal in size. No left atrial/left atrial appendage thrombus was detected. Right Atrium: Right atrial size was normal in size. Pericardium: There is no evidence of pericardial effusion. Mitral Valve: The mitral valve is normal in structure. No evidence of mitral valve regurgitation. No evidence of mitral valve stenosis. Tricuspid Valve: The tricuspid valve is normal in structure. Tricuspid valve regurgitation is mild . No evidence of tricuspid stenosis. Aortic Valve: The aortic valve is normal in structure. Aortic valve regurgitation is trivial. Aortic valve sclerosis is present, with no evidence of aortic valve stenosis. Pulmonic Valve: The pulmonic valve was normal in structure. Pulmonic valve regurgitation is not visualized. No evidence of pulmonic stenosis.  Aorta: The aortic root is normal in size and structure. There is mild (Grade II) atheroma plaque involving the descending aorta. Venous: The inferior vena cava is normal in size with greater  than 50% respiratory variability, suggesting right atrial pressure of 3 mmHg. IAS/Shunts: No atrial level shunt detected by color flow Doppler. Agitated saline contrast was given intravenously to evaluate for intracardiac shunting. Agitated saline contrast bubble study was negative, with no evidence of any interatrial shunt. There  is no evidence of a patent foramen ovale. There is no evidence of an atrial septal defect. Ida Rogue MD Electronically signed by Ida Rogue MD Signature Date/Time: 09/24/2021/6:29:20 PM    Final (Updated)      Medications:    sodium chloride     dialysis solution 1.5% low-MG/low-CA     dialysis solution 2.5% low-MG/low-CA      abacavir  300 mg Oral BID   carvedilol  12.5 mg Oral BID   dolutegravir  50 mg Oral QHS   feeding supplement (NEPRO CARB STEADY)  237 mL Oral TID BM   gentamicin cream  1 application Topical Daily   heparin  5,000 Units Subcutaneous Q8H   insulin aspart  0-6 Units Subcutaneous TID WC   lamiVUDine  25 mg Oral QHS   minoxidil  2.5 mg Oral Q2000   And   minoxidil  2.5 mg Oral Once per day on Sun Tue Thu Sat   multivitamin  1 tablet Oral Daily   potassium chloride  20 mEq Oral Daily   sodium chloride flush  3 mL Intravenous Q12H   vancomycin variable dose per unstable renal function (pharmacist dosing)   Does not apply See admin instructions   sodium chloride, sodium chloride flush  Assessment/ Plan:  Nathan Ayers is a 50 y.o. black male with end stage renal disease on peritoneal dialysis, HIV on HART, hypertension, diabetes mellitus type II, diabetic neuropathy who is admitted to [facility] on 09/22/2021 on Leukocytosis [D72.829] Uremia [N19] Generalized abdominal pain [R10.84] ESRD on peritoneal dialysis ([place]) [N18.6, Z99.2] Acute sepsis ([place]) [A41.9]  [facility] 102.5kg CCPD 9 hours 5 exchanges 2.7 liter fills   End stage renal disease: no indication that patient has peritonitis.  - Continue  peritoneal dialysis. Orders prepared for tonight.   Hypertension: 139/71 - Continue minoxidil, amlodipine, and carvedilol  Anemia with chronic kidney disease: hemoglobin 8.5. normocytic. Mircera as outpatient.   Diabetes mellitus type II with chronic kidney disease: continue glucose control  Sepsis with bacteremia: MRSA in blood cultures.  - appreciate ID and cardiology input.     LOS: 1 Daaiyah Baumert 11/18/20221:07 PM

## 2021-09-25 NOTE — Progress Notes (Signed)
ID Patient feeling better States cough is better Diarrhea has resolved Only symptom history he still having fever Appetite is better  O/e Awake and alert Patient Vitals for the past 24 hrs:  BP Temp Temp src Pulse Resp SpO2  09/25/21 2005 120/69 98.3 F (36.8 C) -- 77 18 93 %  09/25/21 1604 128/60 98.3 F (36.8 C) -- 81 18 100 %  09/25/21 1305 125/60 100.2 F (37.9 C) Oral 89 17 98 %  09/25/21 0920 139/71 (!) 100.4 F (38 C) -- 89 16 97 %  09/25/21 0705 -- (!) 100.7 F (38.2 C) Oral 90 -- 93 %  09/25/21 0533 135/71 (!) 101.7 F (38.7 C) Oral 87 16 98 %    On throat NAD Chest bilateral air entry Heart sounds  Abdomen soft PD catheter in place Site does not have any erythema or tenderness Back normal Lower extremities no infection Left foot scar from prior left Scaling of both feet  Labs CBC Latest Ref Rng & Units 09/24/2021 09/23/2021 09/23/2021  WBC 4.0 - 10.5 K/uL 21.4(H) 25.7(H) 29.9(H)  Hemoglobin 13.0 - 17.0 g/dL 8.5(L) 9.1(L) 8.3(L)  Hematocrit 39.0 - 52.0 % 24.4(L) 25.6(L) 23.9(L)  Platelets 150 - 400 K/uL 297 330 334     CMP Latest Ref Rng & Units 09/24/2021 09/23/2021 09/22/2021  Glucose 70 - 99 mg/dL 215(H) 147(H) 122(H)  BUN 6 - 20 mg/dL 97(H) 106(H) 113(H)  Creatinine 0.61 - 1.24 mg/dL 20.61(H) 24.45(H) 23.91(H)  Sodium 135 - 145 mmol/L 133(L) 134(L) 132(L)  Potassium 3.5 - 5.1 mmol/L 3.4(L) 4.5 4.4  Chloride 98 - 111 mmol/L 94(L) 92(L) 91(L)  CO2 22 - 32 mmol/L 22 22 20(L)  Calcium 8.9 - 10.3 mg/dL 8.7(L) 9.3 9.3  Total Protein 6.5 - 8.1 g/dL - 7.9 8.5(H)  Total Bilirubin 0.3 - 1.2 mg/dL - 0.7 1.0  Alkaline Phos 38 - 126 U/L - 73 73  AST 15 - 41 U/L - 14(L) 12(L)  ALT 0 - 44 U/L - 14 15    Micro 09/22/2021 blood culture 1 set methicillin-resistant staph aureus 1122 group A strep by PCR not detected P 92 peritoneal fluid culture negative Peritoneal fluid analysis shows 76 WBC Total protein 0.2   Imaging CT abdomen pelvis without contrast  no signs of infection CT chest without contrast: No acute process. TEE no vegetation  Impression/recommendation  MRSA bacteremia: Source is unclear He does have a peritoneal dialysis catheter but there is no evidence of peritonitis and cultures been negative There iare no skin lesions no pneumonia and no abdominal source Imaging Does not have any pain in his vertebrae to suggest spinal infection TEE is no vegetation patient overall is better but fever persists If fever continues tomorrow then we will add ceftaroline or change Vanco to Dapto.  Recent strep throat infection which was treated with penicillin  Diarrhea has resolved  Hypertension on amlodipine carvedilol and losartan  [facility] disease [facility] he is well controlled and on abacavir, dolutegravir and Epivir.  CD4 count more than 300.  No evidence of opportunistic infection  Diabetes mellitus on insulin Repeat blood culture sent today  Discussed the management with the patient and hospitalist.  ID will follow him peripherally this weekend.   Call if needed.

## 2021-09-25 NOTE — Progress Notes (Signed)
PROGRESS NOTE    Nathan Ayers  GYK:[id number] DOB: 11/26/1970 DOA: 09/22/2021 PCP: Judeth Cornfield, MD   Chief Complaint  Patient presents with   URI    Brief Narrative:   ANOTHONY Ayers is a 50 y.o. male with medical history significant for HIV compliant w/ antiretrovirals, t1dm, ESRD on peritoneal dialysis, OSA not on cpap, and htn, who presents with the above.   Symptoms began about 2 weeks ago. Developed throat pain and went to a local ED/urgent care where was diagnosed with strep throat and given penicillin. Throat pain has resolved, has no pain or difficulty with swallowing. But has developed subjective fever (now resolved) and chills that continue. Also nausea with one episode of vomiting yesterday. Also loose non-bloody stools. Also mild cough and subjective shortness of breath. Does not make urine. No headache or ear ache. Compliant with antiretrovirals. No skin lesions. No abdominal pain   Assessment & Plan:   Principal Problem:   Leukocytosis Active Problems:   Malnutrition of moderate degree   Type 1 diabetes (HCC)   Essential hypertension   HIV disease (HCC)   ESRD on dialysis (HCC)   OSA (obstructive sleep apnea)  MRSA Bacteremia -Patient with leukocytosis, feeling ill, chills, generalized body ache. -2/3 blood culture growing MRSA -Continue with IV vancomycin -Follow on peritoneal fluid culture and Gram stain, so far no growth -2D echo with no evidence of vegetations, as well TEE has been done by Dr. Rockey Situ  with no evidence of vegetation as well -ID consult greatly appreciated -Follow on repeat blood cultures done 11/18 -Patient remained febrile despite being few days on IV vancomycin.  Hypertension -Overall blood pressure has been on the lower side, patient is on multiple home medications, blood pressure remains acceptable to lower side despite holding his losartan, Norvasc and decreasing his Coreg . -I will discontinue his minoxidil today given soft  blood pressure, continue with low-dose Coreg.    ESRD - On peritoneal dialysis says is compliant w/ that - nephrology consulted   OSA Not on cpap at home   T1DM Says not on insulin since dialysis started - SSI very sensitive    HIV Says compliant w/ meds. Followed by [facility]. Viral load undetectable in September of this year. Cd4 457 in march of this year - continue ziagen, tivicay, epivir - f/u cd4  Will check left lower extremity Dopplers to rule out DVT given edema.   DVT prophylaxis: Heparin Code Status: Full Family Communication: Mother at bedside, did not discuss his HIV status in front of her. Disposition:   Status is: inpatient  The patient will require care spanning > 2 midnights and should be moved to inpatient because: Bacteremia and need of IV antibiotics      Consultants:  Renal ID [facility] for TEE.  Procedures -TEE 11/17.  Subjective:  Patient reports overall he is feeling better, weakness has improved, patient with fever overnight 101.7, he denies any chest pain, dyspnea, abdominal pain, nausea, vomiting or diarrhea, no sore throat.    Objective: Vitals:   09/25/21 0533 09/25/21 0705 09/25/21 0920 09/25/21 1305  BP: 135/71  139/71 125/60  Pulse: 87 90 89 89  Resp: 16  16 17   Temp: (!) 101.7 F (38.7 C) (!) 100.7 F (38.2 C) (!) 100.4 F (38 C) 100.2 F (37.9 C)  TempSrc: Oral Oral  Oral  SpO2: 98% 93% 97% 98%  Weight:      Height:        Intake/Output  Summary (Last 24 hours) at 09/25/2021 1325 Last data filed at 09/25/2021 0500 Gross per 24 hour  Intake 370 ml  Output 0 ml  Net 370 ml   Filed Weights   09/22/21 1123 09/24/21 1333  Weight: 104.3 kg 104.3 kg    Examination:  Awake Alert, Oriented X 3, No new F.N deficits, Normal affect Symmetrical Chest wall movement, Good air movement bilaterally, CTAB RRR,No Gallops,Rubs or new Murmurs, No Parasternal Heave +ve B.Sounds, Abd Soft, No tenderness, No rebound - guarding or  rigidity. No Cyanosis, Clubbing, mild left lower extremity edema      Data Reviewed: I have personally reviewed following labs and imaging studies  CBC: Recent Labs  Lab 09/22/21 1332 09/23/21 0445 09/23/21 0643 09/24/21 0552  WBC 25.9* 29.9* 25.7* 21.4*  NEUTROABS 20.7*  --  21.7*  --   HGB 9.2* 8.3* 9.1* 8.5*  HCT 26.2* 23.9* 25.6* 24.4*  MCV 84.8 84.5 82 82.2  PLT 303 334 330 937    Basic Metabolic Panel: Recent Labs  Lab 09/22/21 1332 09/23/21 0445 09/24/21 0552  NA 132* 134* 133*  K 4.4 4.5 3.4*  CL 91* 92* 94*  CO2 20* 22 22  GLUCOSE 122* 147* 215*  BUN 113* 106* 97*  CREATININE 23.91* 24.45* 20.61*  CALCIUM 9.3 9.3 8.7*    GFR: Estimated Creatinine Clearance: 5.7 mL/min (A) (by C-G formula based on SCr of 20.61 mg/dL (H)).  Liver Function Tests: Recent Labs  Lab 09/22/21 1332 09/23/21 0445  AST 12* 14*  ALT 15 14  ALKPHOS 73 73  BILITOT 1.0 0.7  PROT 8.5* 7.9  ALBUMIN 2.6* 2.4*    CBG: Recent Labs  Lab 09/24/21 1454 09/24/21 1634 09/24/21 2111 09/25/21 0917 09/25/21 1303  GLUCAP 106* 110* 212* 131* 159*     Recent Results (from the past 240 hour(s))  Resp Panel by RT-PCR (Flu A&B, Covid) Nasopharyngeal Swab     Status: None   Collection Time: 09/22/21 11:30 AM   Specimen: Nasopharyngeal Swab; Nasopharyngeal(NP) swabs in vial transport medium  Result Value Ref Range Status   SARS Coronavirus 2 by RT PCR NEGATIVE NEGATIVE Final    Comment: (NOTE) SARS-CoV-2 target nucleic acids are NOT DETECTED.  The SARS-CoV-2 RNA is generally detectable in upper respiratory specimens during the acute phase of infection. The lowest concentration of SARS-CoV-2 viral copies this assay can detect is 138 copies/mL. A negative result does not preclude SARS-Cov-2 infection and should not be used as the sole basis for treatment or other patient management decisions. A negative result may occur with  improper specimen collection/handling, submission of  specimen other than nasopharyngeal swab, presence of viral mutation(s) within the areas targeted by this assay, and inadequate number of viral copies(<138 copies/mL). A negative result must be combined with clinical observations, patient history, and epidemiological information. The expected result is Negative.  Fact Sheet for Patients:  EntrepreneurPulse.com.au  Fact Sheet for Healthcare Providers:  IncredibleEmployment.be  This test is no t yet approved or cleared by the [place] FDA and  has been authorized for detection and/or diagnosis of SARS-CoV-2 by FDA under an Emergency Use Authorization (EUA). This EUA will remain  in effect (meaning this test can be used) for the duration of the COVID-19 declaration under Section 564(b)(1) of the Act, 21 U.S.C.section 360bbb-3(b)(1), unless the authorization is terminated  or revoked sooner.       Influenza A by PCR NEGATIVE NEGATIVE Final   Influenza B by PCR NEGATIVE NEGATIVE Final  Comment: (NOTE) The Xpert Xpress SARS-CoV-2/FLU/RSV plus assay is intended as an aid in the diagnosis of influenza from Nasopharyngeal swab specimens and should not be used as a sole basis for treatment. Nasal washings and aspirates are unacceptable for Xpert Xpress SARS-CoV-2/FLU/RSV testing.  Fact Sheet for Patients: EntrepreneurPulse.com.au  Fact Sheet for Healthcare Providers: IncredibleEmployment.be  This test is not yet approved or cleared by the [place] FDA and has been authorized for detection and/or diagnosis of SARS-CoV-2 by FDA under an Emergency Use Authorization (EUA). This EUA will remain in effect (meaning this test can be used) for the duration of the COVID-19 declaration under Section 564(b)(1) of the Act, 21 U.S.C. section 360bbb-3(b)(1), unless the authorization is terminated or revoked.  Performed at [facility], [place], [place] [address]   Respiratory (~20 pathogens) panel by PCR     Status: None   Collection Time: 09/22/21 11:30 AM   Specimen: Peritoneal Washings; Respiratory  Result Value Ref Range Status   Adenovirus NOT DETECTED NOT DETECTED Final   Coronavirus 229E NOT DETECTED NOT DETECTED Final    Comment: (NOTE) The Coronavirus on the Respiratory Panel, DOES NOT test for the novel  Coronavirus (2019 nCoV)    Coronavirus HKU1 NOT DETECTED NOT DETECTED Final   Coronavirus NL63 NOT DETECTED NOT DETECTED Final   Coronavirus OC43 NOT DETECTED NOT DETECTED Final   Metapneumovirus NOT DETECTED NOT DETECTED Final   Rhinovirus / Enterovirus NOT DETECTED NOT DETECTED Final   Influenza A NOT DETECTED NOT DETECTED Final   Influenza B NOT DETECTED NOT DETECTED Final   Parainfluenza Virus 1 NOT DETECTED NOT DETECTED Final   Parainfluenza Virus 2 NOT DETECTED NOT DETECTED Final   Parainfluenza Virus 3 NOT DETECTED NOT DETECTED Final   Parainfluenza Virus 4 NOT DETECTED NOT DETECTED Final   Respiratory Syncytial Virus NOT DETECTED NOT DETECTED Final   Bordetella pertussis NOT DETECTED NOT DETECTED Final   Bordetella Parapertussis NOT DETECTED NOT DETECTED Final   Chlamydophila pneumoniae NOT DETECTED NOT DETECTED Final   Mycoplasma pneumoniae NOT DETECTED NOT DETECTED Final    Comment: Performed at [facility], [place], [place], [place] [address]  Blood Culture (routine x 2)     Status: None (Preliminary result)   Collection Time: 09/22/21  1:32 PM   Specimen: BLOOD  Result Value Ref Range Status   Specimen Description BLOOD LEFT ANTECUBITAL  Final   Special Requests   Final    BOTTLES DRAWN AEROBIC AND ANAEROBIC Blood Culture results may not be optimal due to an excessive volume of blood received in culture bottles   Culture  Setup Time   Final    AEROBIC BOTTLE ONLY GRAM POSITIVE COCCI ANAEROBIC BOTTLE ONLY CRITICAL VALUE NOTED.  VALUE IS CONSISTENT WITH PREVIOUSLY REPORTED  AND CALLED VALUE. Performed at [facility], [address] [place], [place] [address]    Culture [facility] POSITIVE COCCI  Final   Report Status PENDING  Incomplete  Blood Culture (routine x 2)     Status: Abnormal   Collection Time: 09/22/21  1:32 PM   Specimen: BLOOD  Result Value Ref Range Status   Specimen Description   Final    BLOOD LEFT ANTECUBITAL Performed at [facility], [address], [place], [place] [address]    Special Requests   Final    BOTTLES DRAWN AEROBIC AND ANAEROBIC Blood Culture adequate volume Performed at [facility], [place], [place]  [address]    Culture  Setup Time   Final    GRAM POSITIVE COCCI IN BOTH AEROBIC AND ANAEROBIC BOTTLES Organism ID to follow CRITICAL RESULT CALLED TO, READ BACK BY AND VERIFIED WITH: Ardeen Garland, PHARMD AT 0725 ON 09/23/21 BY GM Performed at [facility], [address] [place], [place] [address]    Culture METHICILLIN RESISTANT STAPHYLOCOCCUS AUREUS (A)  Final   Report Status 09/25/2021 FINAL  Final   Organism ID, Bacteria METHICILLIN RESISTANT STAPHYLOCOCCUS AUREUS  Final      Susceptibility   Methicillin resistant staphylococcus aureus - MIC*    CIPROFLOXACIN 2 INTERMEDIATE Intermediate     ERYTHROMYCIN >=8 RESISTANT Resistant     GENTAMICIN <=0.5 SENSITIVE Sensitive     OXACILLIN >=4 RESISTANT Resistant     TETRACYCLINE <=1 SENSITIVE Sensitive     VANCOMYCIN 1 SENSITIVE Sensitive     TRIMETH/SULFA <=10 SENSITIVE Sensitive     CLINDAMYCIN <=0.25 SENSITIVE Sensitive     RIFAMPIN <=0.5 SENSITIVE Sensitive     Inducible Clindamycin NEGATIVE Sensitive     * METHICILLIN RESISTANT STAPHYLOCOCCUS AUREUS  Blood Culture ID Panel (Reflexed)     Status: Abnormal   Collection Time: 09/22/21  1:32 PM  Result Value Ref Range Status   Enterococcus faecalis NOT DETECTED NOT DETECTED Final   Enterococcus Faecium NOT DETECTED NOT DETECTED Final   Listeria monocytogenes NOT  DETECTED NOT DETECTED Final   Staphylococcus species DETECTED (A) NOT DETECTED Final    Comment: CRITICAL RESULT CALLED TO, READ BACK BY AND VERIFIED WITH: MORGAN HICKS, PHARMD AT 0725 ON 09/23/21 BY GM    Staphylococcus aureus (BCID) DETECTED (A) NOT DETECTED Final    Comment: Methicillin (oxacillin)-resistant Staphylococcus aureus (MRSA). MRSA is predictably resistant to beta-lactam antibiotics (except ceftaroline). Preferred therapy is vancomycin unless clinically contraindicated. Patient requires contact precautions if  hospitalized. CRITICAL RESULT CALLED TO, READ BACK BY AND VERIFIED WITH: MORGAN HICKS, PHARMD AT 0725 ON 09/23/21 BY GM    Staphylococcus epidermidis NOT DETECTED NOT DETECTED Final   Staphylococcus lugdunensis NOT DETECTED NOT DETECTED Final   Streptococcus species NOT DETECTED NOT DETECTED Final   Streptococcus agalactiae NOT DETECTED NOT DETECTED Final   Streptococcus pneumoniae NOT DETECTED NOT DETECTED Final   Streptococcus pyogenes NOT DETECTED NOT DETECTED Final   A.calcoaceticus-baumannii NOT DETECTED NOT DETECTED Final   Bacteroides fragilis NOT DETECTED NOT DETECTED Final   Enterobacterales NOT DETECTED NOT DETECTED Final   Enterobacter cloacae complex NOT DETECTED NOT DETECTED Final   Escherichia coli NOT DETECTED NOT DETECTED Final   Klebsiella aerogenes NOT DETECTED NOT DETECTED Final   Klebsiella oxytoca NOT DETECTED NOT DETECTED Final   Klebsiella pneumoniae NOT DETECTED NOT DETECTED Final   Proteus species NOT DETECTED NOT DETECTED Final   Salmonella species NOT DETECTED NOT DETECTED Final   Serratia marcescens NOT DETECTED NOT DETECTED Final   Haemophilus influenzae NOT DETECTED NOT DETECTED Final   Neisseria meningitidis NOT DETECTED NOT DETECTED Final   Pseudomonas aeruginosa NOT DETECTED NOT DETECTED Final   Stenotrophomonas maltophilia NOT DETECTED NOT DETECTED Final   Candida albicans NOT DETECTED NOT DETECTED Final   Candida auris NOT  DETECTED NOT DETECTED Final   Candida glabrata NOT DETECTED NOT DETECTED Final   Candida krusei NOT DETECTED NOT DETECTED Final   Candida parapsilosis NOT DETECTED NOT DETECTED Final   Candida tropicalis NOT DETECTED NOT DETECTED Final   Cryptococcus neoformans/gattii NOT DETECTED NOT DETECTED Final   Meth resistant mecA/C and MREJ DETECTED (A)  NOT DETECTED Final    Comment: CRITICAL RESULT CALLED TO, READ BACK BY AND VERIFIED WITH: Ardeen Garland, PHARMD AT 0725 ON 09/23/21 BY GM Performed at [facility], [address] [place], [place] [address]   Group A Strep by PCR [facility] Only)     Status: None   Collection Time: 09/22/21  1:40 PM   Specimen: Throat; Sterile Swab  Result Value Ref Range Status   Group A Strep by PCR NOT DETECTED NOT DETECTED Final    Comment: Performed at [facility], [address] [place], [place] [address]  Peritoneal fluid culture w Gram Stain     Status: None (Preliminary result)   Collection Time: 09/22/21  7:29 PM   Specimen: Peritoneal Washings; Peritoneal Fluid  Result Value Ref Range Status   Specimen Description   Final    PERITONEAL Performed at [facility], [address] [place], [place] [address]    Special Requests   Final    NONE Performed at [facility], [address] [place], [place] [address]    Gram Stain   Final    FEW WBC PRESENT, PREDOMINANTLY MONONUCLEAR NO ORGANISMS SEEN    Culture   Final    NO GROWTH 3 DAYS Performed at [facility], [place] [address], [place], [place] [address]    Report Status PENDING  Incomplete  Gastrointestinal Panel by PCR , Stool     Status: None   Collection Time: 09/23/21  4:45 AM   Specimen: Stool  Result Value Ref Range Status   Campylobacter species NOT DETECTED NOT DETECTED Final   Plesimonas shigelloides NOT DETECTED NOT DETECTED Final   Salmonella species NOT DETECTED NOT DETECTED Final   Yersinia enterocolitica NOT DETECTED NOT DETECTED Final    Vibrio species NOT DETECTED NOT DETECTED Final   Vibrio cholerae NOT DETECTED NOT DETECTED Final   Enteroaggregative E coli (EAEC) NOT DETECTED NOT DETECTED Final   Enteropathogenic E coli (EPEC) NOT DETECTED NOT DETECTED Final   Enterotoxigenic E coli (ETEC) NOT DETECTED NOT DETECTED Final   Shiga like toxin producing E coli (STEC) NOT DETECTED NOT DETECTED Final   Shigella/Enteroinvasive E coli (EIEC) NOT DETECTED NOT DETECTED Final   Cryptosporidium NOT DETECTED NOT DETECTED Final   Cyclospora cayetanensis NOT DETECTED NOT DETECTED Final   Entamoeba histolytica NOT DETECTED NOT DETECTED Final   Giardia lamblia NOT DETECTED NOT DETECTED Final   Adenovirus F40/41 NOT DETECTED NOT DETECTED Final   Astrovirus NOT DETECTED NOT DETECTED Final   Norovirus GI/GII NOT DETECTED NOT DETECTED Final   Rotavirus A NOT DETECTED NOT DETECTED Final   Sapovirus (I, II, IV, and V) NOT DETECTED NOT DETECTED Final    Comment: Performed at [facility], [address] [place], [place] [address]  C Difficile Quick Screen w PCR reflex     Status: None   Collection Time: 09/23/21  4:45 AM   Specimen: Stool  Result Value Ref Range Status   C Diff antigen NEGATIVE NEGATIVE Final   C Diff toxin NEGATIVE NEGATIVE Final   C Diff interpretation No C. difficile detected.  Final    Comment: Performed at [facility], [address] [place], [place] [address]         Radiology Studies: US Venous Img Lower Unilateral Left (DVT)  Result Date: 09/25/2021 CLINICAL DATA:  Left lower extremity pain for the past 3 weeks. Evaluate for DVT. EXAM: LEFT LOWER EXTREMITY VENOUS DOPPLER ULTRASOUND TECHNIQUE:  Gray-scale sonography with graded compression, as well as color Doppler and duplex ultrasound were performed to evaluate the lower extremity deep venous systems from the level of the common femoral vein and including the common femoral, femoral, profunda femoral, popliteal and calf veins including  the posterior tibial, peroneal and gastrocnemius veins when visible. The superficial great saphenous vein was also interrogated. Spectral Doppler was utilized to evaluate flow at rest and with distal augmentation maneuvers in the common femoral, femoral and popliteal veins. COMPARISON:  CT the chest, abdomen and pelvis-09/22/2021 FINDINGS: Contralateral Common Femoral Vein: Respiratory phasicity is normal and symmetric with the symptomatic side. No evidence of thrombus. Normal compressibility. Common Femoral Vein: No evidence of thrombus. Normal compressibility, respiratory phasicity and response to augmentation. Saphenofemoral Junction: No evidence of thrombus. Normal compressibility and flow on color Doppler imaging. Profunda Femoral Vein: No evidence of thrombus. Normal compressibility and flow on color Doppler imaging. Femoral Vein: No evidence of thrombus. Normal compressibility, respiratory phasicity and response to augmentation. Popliteal Vein: No evidence of thrombus. Normal compressibility, respiratory phasicity and response to augmentation. Calf Veins: No evidence of thrombus. Normal compressibility and flow on color Doppler imaging. Superficial Great Saphenous Vein: No evidence of thrombus. Normal compressibility. Venous Reflux:  None. Other Findings: Note is made of mildly prominent though non pathologically enlarged left inguinal lymph nodes with index left inguinal lymph node measuring 1.1 cm in greatest short axis diameter (image 14). Borderline enlarged right inguinal lymph node measures 1.5 cm in greatest short axis diameter with partial thickening on its cortex though maintenance of a benign fatty hilum (image 40). IMPRESSION: 1. No evidence of DVT within the left lower extremity. 2. Prominent bilateral inguinal lymph nodes, right greater than left, similar to recent abdominal CT, nonspecific though presumably reactive in etiology in the setting peritoneal dialysis. Clinical correlation is advised.  Electronically Signed   By: Sandi Mariscal M.D.   On: 09/25/2021 12:44   ECHO TEE  Result Date: 09/24/2021    TRANSESOPHOGEAL ECHO REPORT   Patient Name:   Nathan Ayers Date of Exam: 09/24/2021 Medical Rec #:  [id number]      Height:       78.0 in Accession #:    [id number]     Weight:       230.0 lb Date of Birth:  19-Feb-1971      BSA:          2.396 m Patient Age:    25 years       BP:           100/42 mmHg Patient Gender: M              HR:           81 bpm. Exam Location:  [facility] Procedure: Transesophageal Echo, Cardiac Doppler and Color Doppler Indications:     MRSA bacteremia, endocarditis, aorta atheroma  History:         Patient has prior history of Echocardiogram examinations, most                  recent 09/22/2021. Risk Factors:Hypertension and Diabetes. CKD.  Sonographer:     Sherrie Sport Referring Phys:  [place] Diagnosing Phys: Ida Rogue MD PROCEDURE: After discussion of the risks and benefits of a TEE, an informed consent was obtained from the patient. TEE procedure time was 30 minutes. The transesophogeal probe was passed without difficulty through the esophogus of the patient. Imaged were obtained with the patient  in a left lateral decubitus position. Local oropharyngeal anesthetic was provided with Cetacaine and viscous lidocaine. Sedation performed by performing physician. Patients was under conscious sedation during this procedure. Anesthetic administered: 168mg of Fentanyl, 4.080mof Versed. Image quality was excellent. The patient's vital signs; including heart rate, blood pressure, and oxygen saturation; remained stable throughout the procedure. The patient developed no complications during the procedure. IMPRESSIONS  1. No valve endocarditis  2. Left ventricular ejection fraction, by estimation, is 60 to 65%. The left ventricle has normal function. The left ventricle has no regional wall motion abnormalities.  3. Right ventricular systolic function is normal. The right  ventricular size is normal.  4. No left atrial/left atrial appendage thrombus was detected.  5. The mitral valve is normal in structure. No evidence of mitral valve regurgitation. No evidence of mitral stenosis.  6. The aortic valve is normal in structure. Aortic valve regurgitation is trivial. Aortic valve sclerosis is present, with no evidence of aortic valve stenosis.  7. There is mild (Grade II) atheroma plaque involving the descending aorta.  8. The inferior vena cava is normal in size with greater than 50% respiratory variability, suggesting right atrial pressure of 3 mmHg.  9. Agitated saline contrast bubble study was negative, with no evidence of any interatrial shunt. Conclusion(s)/Recommendation(s): Normal biventricular function without evidence of hemodynamically significant valvular heart disease. FINDINGS  Left Ventricle: Left ventricular ejection fraction, by estimation, is 60 to 65%. The left ventricle has normal function. The left ventricle has no regional wall motion abnormalities. The left ventricular internal cavity size was normal in size. There is  no left ventricular hypertrophy. Right Ventricle: The right ventricular size is normal. No increase in right ventricular wall thickness. Right ventricular systolic function is normal. Left Atrium: Left atrial size was normal in size. No left atrial/left atrial appendage thrombus was detected. Right Atrium: Right atrial size was normal in size. Pericardium: There is no evidence of pericardial effusion. Mitral Valve: The mitral valve is normal in structure. No evidence of mitral valve regurgitation. No evidence of mitral valve stenosis. Tricuspid Valve: The tricuspid valve is normal in structure. Tricuspid valve regurgitation is mild . No evidence of tricuspid stenosis. Aortic Valve: The aortic valve is normal in structure. Aortic valve regurgitation is trivial. Aortic valve sclerosis is present, with no evidence of aortic valve stenosis. Pulmonic  Valve: The pulmonic valve was normal in structure. Pulmonic valve regurgitation is not visualized. No evidence of pulmonic stenosis. Aorta: The aortic root is normal in size and structure. There is mild (Grade II) atheroma plaque involving the descending aorta. Venous: The inferior vena cava is normal in size with greater than 50% respiratory variability, suggesting right atrial pressure of 3 mmHg. IAS/Shunts: No atrial level shunt detected by color flow Doppler. Agitated saline contrast was given intravenously to evaluate for intracardiac shunting. Agitated saline contrast bubble study was negative, with no evidence of any interatrial shunt. There  is no evidence of a patent foramen ovale. There is no evidence of an atrial septal defect. TiIda RogueD Electronically signed by TiIda RogueD Signature Date/Time: 09/24/2021/6:29:20 PM    Final (Updated)         Scheduled Meds:  abacavir  300 mg Oral BID   carvedilol  12.5 mg Oral BID   dolutegravir  50 mg Oral QHS   feeding supplement (NEPRO CARB STEADY)  237 mL Oral TID BM   gentamicin cream  1 application Topical Daily   heparin  5,000 Units Subcutaneous  Q8H   insulin aspart  0-6 Units Subcutaneous TID WC   lamiVUDine  25 mg Oral QHS   multivitamin  1 tablet Oral Daily   potassium chloride  20 mEq Oral Daily   sodium chloride flush  3 mL Intravenous Q12H   vancomycin variable dose per unstable renal function (pharmacist dosing)   Does not apply See admin instructions   Continuous Infusions:  sodium chloride     dialysis solution 1.5% low-MG/low-CA     dialysis solution 2.5% low-MG/low-CA       LOS: 1 day       Phillips Climes, MD [facility]   To contact the attending provider between 7A-7P or the covering provider during after hours 7P-7A, please log into the web site [email or web address] and access using universal [place] password for that web site. If you do not have the password, please call the hospital  operator.  09/25/2021, 1:25 PM

## 2021-09-25 NOTE — Progress Notes (Signed)
Peritoneal dialysis patient known at [facility]. Education provided, no dialysis concerns were stated. Please contact me with any dialysis placement concerns.  Elvera Bicker Dialysis Coordinator [phone]

## 2021-09-25 NOTE — Progress Notes (Addendum)
Pharmacy Antibiotic Note  Nathan Ayers is a 50 y.o. male admitted on 09/22/2021 with MRSA bacteremia.  Pharmacy has been consulted for vancomycin dosing. PMH includes ESRD on CCPD (9 hour dwell, 5 exchanges, 2.7 L fills) 7d/wk, HIV (CD4 302).  Recent URI 11/11 treated with PCN. Now with positive nausea, vomiting, fever, and positive blood cx growing MRSA. Fluid negative for peritonitis and no infection at catheter site. Diarrhea with negative GI panel. TEE negative. Source remains unclear.  Given vancomycin 1,000 mg 11/15 in PM. Last exchange 11/17 AM.  11/18 vancomycin random level: 27   Plan: Hold vancomycin until level < 20 mcg/mL Check vancomycin random level Sunday AM (48 hours from last level) Continue checking Q48 hour levels until < 20 mcg/mL. Once < 20, may re-dose at 15 mg/kg.  Follow nephrology plans and clinical course. F/u ECHO.  Height: 6' 6"  (198.1 cm) Weight: 104.3 kg (230 lb) IBW/kg (Calculated) : 91.4  Temp (24hrs), Avg:99.6 F (37.6 C), Min:98.7 F (37.1 C), Max:101.7 F (38.7 C)  Recent Labs  Lab 09/22/21 1332 09/23/21 0445 09/23/21 0643 09/24/21 0552 09/25/21 0612  WBC 25.9* 29.9* 25.7* 21.4*  --   CREATININE 23.91* 24.45*  --  20.61*  --   LATICACIDVEN 1.1  --   --   --   --   VANCORANDOM  --   --   --   --  27     Estimated Creatinine Clearance: 5.7 mL/min (A) (by C-G formula based on SCr of 20.61 mg/dL (H)).    Allergies  Allergen Reactions   Lactose Intolerance (Gi)     Antimicrobials this admission: 11/15 vanc >>  11/15 cefepime >> 11/15 11/15 metronidazole >> 11/15  Dose adjustments this admission: N/a   Microbiology results: 11/15 BCx: MRSA 3/4 11/15 Peritoneal fluid: few WBC, NG x 3 d 11/16 C. Diff toxin: negative 11/15 Group A strep PCR: not detected 11/16 CD4: 302 11/18 Bcx: in process   Thank you for allowing pharmacy to be a part of this patient's care.   Wynelle Cleveland, PharmD Pharmacy Resident   09/25/2021 8:29 AM

## 2021-09-26 ENCOUNTER — Inpatient Hospital Stay: Payer: Medicare Other

## 2021-09-26 DIAGNOSIS — Z79899 Other long term (current) drug therapy: Secondary | ICD-10-CM | POA: Diagnosis not present

## 2021-09-26 DIAGNOSIS — N2581 Secondary hyperparathyroidism of renal origin: Secondary | ICD-10-CM | POA: Diagnosis not present

## 2021-09-26 DIAGNOSIS — A419 Sepsis, unspecified organism: Secondary | ICD-10-CM | POA: Diagnosis not present

## 2021-09-26 DIAGNOSIS — R7881 Bacteremia: Secondary | ICD-10-CM | POA: Diagnosis not present

## 2021-09-26 DIAGNOSIS — E440 Moderate protein-calorie malnutrition: Secondary | ICD-10-CM | POA: Diagnosis not present

## 2021-09-26 DIAGNOSIS — D509 Iron deficiency anemia, unspecified: Secondary | ICD-10-CM | POA: Diagnosis not present

## 2021-09-26 DIAGNOSIS — Z992 Dependence on renal dialysis: Secondary | ICD-10-CM | POA: Diagnosis not present

## 2021-09-26 DIAGNOSIS — N2589 Other disorders resulting from impaired renal tubular function: Secondary | ICD-10-CM | POA: Diagnosis not present

## 2021-09-26 DIAGNOSIS — R17 Unspecified jaundice: Secondary | ICD-10-CM | POA: Diagnosis not present

## 2021-09-26 DIAGNOSIS — D631 Anemia in chronic kidney disease: Secondary | ICD-10-CM | POA: Diagnosis not present

## 2021-09-26 DIAGNOSIS — N186 End stage renal disease: Secondary | ICD-10-CM | POA: Diagnosis not present

## 2021-09-26 LAB — BASIC METABOLIC PANEL
Anion gap: 15 (ref 5–15)
BUN: 89 mg/dL — ABNORMAL HIGH (ref 6–20)
CO2: 24 mmol/L (ref 22–32)
Calcium: 8.7 mg/dL — ABNORMAL LOW (ref 8.9–10.3)
Chloride: 95 mmol/L — ABNORMAL LOW (ref 98–111)
Creatinine, Ser: 18.71 mg/dL — ABNORMAL HIGH (ref 0.61–1.24)
GFR, Estimated: 3 mL/min — ABNORMAL LOW (ref >60)
Glucose, Bld: 224 mg/dL — ABNORMAL HIGH (ref 70–99)
Potassium: 3.6 mmol/L (ref 3.5–5.1)
Sodium: 134 mmol/L — ABNORMAL LOW (ref 135–145)

## 2021-09-26 LAB — BODY FLUID CULTURE W GRAM STAIN: Culture: NO GROWTH

## 2021-09-26 LAB — CBC
HCT: 23.7 % — ABNORMAL LOW (ref 39.0–52.0)
Hemoglobin: 7.9 g/dL — ABNORMAL LOW (ref 13.0–17.0)
MCH: 28.1 pg (ref 26.0–34.0)
MCHC: 33.3 g/dL (ref 30.0–36.0)
MCV: 84.3 fL (ref 80.0–100.0)
Platelets: 309 10*3/uL (ref 150–400)
RBC: 2.81 MIL/uL — ABNORMAL LOW (ref 4.22–5.81)
RDW: 13.7 % (ref 11.5–15.5)
WBC: 18.1 10*3/uL — ABNORMAL HIGH (ref 4.0–10.5)
nRBC: 0 % (ref 0.0–0.2)

## 2021-09-26 LAB — GLUCOSE, CAPILLARY
Glucose-Capillary: 181 mg/dL — ABNORMAL HIGH (ref 70–99)
Glucose-Capillary: 125 mg/dL — ABNORMAL HIGH (ref 70–99)
Glucose-Capillary: 111 mg/dL — ABNORMAL HIGH (ref 70–99)
Glucose-Capillary: 122 mg/dL — ABNORMAL HIGH (ref 70–99)
Glucose-Capillary: 117 mg/dL — ABNORMAL HIGH (ref 70–99)

## 2021-09-26 NOTE — Consult Note (Signed)
PODIATRY / FOOT AND ANKLE SURGERY CONSULTATION NOTE  Requesting Physician: Dr. Waldron Labs  Reason for consult: Left foot infection/bacteremia/sepsis  Chief Complaint: Left foot infection   HPI: Nathan Ayers is a 50 y.o. male presented to the ED/urgent care setting 2 weeks ago due to throat pain where he was diagnosed with strep throat and given penicillin.  Patient noted that the throat pain resolved and had no pain with swallowing but he did still have a fever and chills that continued with also nausea.  Patient also noted loose nonbloody stools as well as mild cough and shortness of breath.  Patient was admitted to [facility] due to concerns for sepsis due to the unresolved fevers and leukocytosis.  Podiatry team was consulted for further evaluation ration of left foot due to concerns for left foot infection.  Patient presents today resting in bed comfortably.  Patient has seen Dr. Vickki Muff in the past for a lateral foot infection which he had to have a partial fifth ray amputation performed.  Patient also has a history of peripheral vascular disease, HIV, and diabetes with neuropathy.  Patient notes he has very little feeling to his foot.  PMHx:  Past Medical History:  Diagnosis Date   Anxiety    Chronic kidney disease (CKD), active medical management without dialysis    Mon, Wed and Fri   Chronic kidney disease on chronic dialysis ([place])    Diabetes mellitus ([place])    Diabetic neuropathy ([place])    Dyspnea    HIV infection ([place])    Hypertension     Surgical Hx:  Past Surgical History:  Procedure Laterality Date   AMPUTATION TOE Left 02/04/2017   Procedure: AMPUTATION 5TH METATARSAL AND JOINT;  Surgeon: Samara Deist, DPM;  Location: [facility] ORS;  Service: Podiatry;  Laterality: Left;   APPLICATION OF WOUND VAC Left 05/20/2017   Procedure: APPLICATION OF WOUND VAC;  Surgeon: Samara Deist, DPM;  Location: [facility] OR[facility];  Service: Podiatry;  Laterality: Left;   AV FISTULA  PLACEMENT     BONE EXCISION Left 02/17/2018   Procedure: BONE EXCISION METATARSAL;  Surgeon: Samara Deist, DPM;  Location: [facility] ORS;  Service: Podiatry;  Laterality: Left;   INCISION AND DRAINAGE OF WOUND Left 05/20/2017   Procedure: IRRIGATION AND DEBRIDEMENT WOUND;  Surgeon: Samara Deist, DPM;  Location: [facility] ORS;  Service: Podiatry;  Laterality: Left;   TEE WITHOUT CARDIOVERSION N/A 09/24/2021   Procedure: TRANSESOPHAGEAL ECHOCARDIOGRAM (TEE);  Surgeon: [name], MD;  Location: [facility] ORS;  Service: Cardiovascular;  Laterality: N/A;    FHx:  Family History  Problem Relation Age of Onset   Diabetes Mother    Hypertension Mother     Social History:  reports that he has never smoked. He has never used smokeless tobacco. He reports that he does not drink alcohol and does not use drugs.  Allergies:  Allergies  Allergen Reactions   Lactose Intolerance (Gi)    Medications Prior to Admission  Medication Sig Dispense Refill   abacavir (ZIAGEN) 300 MG tablet Take 300 mg by mouth 2 (two) times daily.      amLODipine (NORVASC) 5 MG tablet Take 5 mg by mouth daily.     carvedilol (COREG) 25 MG tablet Take 25 mg by mouth 2 (two) times daily.   9   lamiVUDine (EPIVIR) 10 MG/ML solution Take 2.5 mLs by mouth at bedtime.   3   lanthanum (FOSRENOL) 1000 MG chewable tablet Chew 2,000 mg by mouth 3 (three)  times daily.     minoxidil (LONITEN) 2.5 MG tablet Take 2.5 mg by mouth See admin instructions. TAKES TWICE DAILY, EXCEPT FOR THE AM DOSE ON DIALYSIS DAYS; MON, WED, FRI  3   ondansetron (ZOFRAN-ODT) 4 MG disintegrating tablet Take 4 mg by mouth every 8 (eight) hours as needed for nausea or vomiting.      SANTYL ointment Apply 1 application topically daily. APPLIES TO FOOT USING GAUZE THEN WRAPS FOOT  2   TIVICAY 50 MG tablet Take 50 mg by mouth 2 (two) times daily.   3   cinacalcet (SENSIPAR) 30 MG tablet Take 30 mg by mouth daily.     losartan (COZAAR) 100 MG tablet Take 100 mg by  mouth daily.     losartan (COZAAR) 100 MG tablet Take 100 mg by mouth 1 day or 1 dose.     Nutritional Supplements (FEEDING SUPPLEMENT, NEPRO CARB STEADY,) LIQD Take 237 mLs by mouth as needed (missed meal during dialysis.). 30 Can 0    Physical Exam: General: Alert and oriented.  No apparent distress.  Vascular: DP/PT pulses left difficult to palpate likely due to swelling, right palpable, capillary fill time appears to be intact to digits bilateral.  Moderate left lower extremity edema, nonpitting, mild right side.  No hair growth noted to digits.   Neuro: Light touch sensation absent to bilateral lower extremities.  Derm: Area of palpable fluctuance near the left first metatarsal phalangeal joint with obvious draining abscess, able to exsanguinate a large amount of seropurulent drainage from the first metatarsal phalangeal joint area dorsal medially.  Patient also has large callus formation to the left fourth/fifth metatarsal phalangeal joint area, no drainage is noted from the site.  Associated erythema and edema present and fluctuance present along the dorsal aspect of the foot that extends to the midfoot and around the medial arch area following the tarsal tunnel.    MSK: Left partial fifth ray amputation.  Results for orders placed or performed during the hospital encounter of 09/22/21 (from the past 48 hour(s))  Glucose, capillary     Status: Abnormal   Collection Time: 09/24/21  4:34 PM  Result Value Ref Range   Glucose-Capillary 110 (H) 70 - 99 mg/dL    Comment: Glucose reference range applies only to samples taken after fasting for at least 8 hours.   Comment 1 Notify RN   Glucose, capillary     Status: Abnormal   Collection Time: 09/24/21  9:11 PM  Result Value Ref Range   Glucose-Capillary 212 (H) 70 - 99 mg/dL    Comment: Glucose reference range applies only to samples taken after fasting for at least 8 hours.  Vancomycin, random     Status: None   Collection Time:  09/25/21  6:12 AM  Result Value Ref Range   Vancomycin Rm 27     Comment:        Random Vancomycin therapeutic range is dependent on dosage and time of specimen collection. A peak range is 20.0-40.0 ug/mL A trough range is 5.0-15.0 ug/mL        Performed at [facility], [address] [place], [place] [address]   CULTURE, BLOOD (ROUTINE X 2) w Reflex to ID Panel     Status: None (Preliminary result)   Collection Time: 09/25/21  6:12 AM   Specimen: BLOOD  Result Value Ref Range   Specimen Description BLOOD LEFT ARM    Special Requests      BOTTLES DRAWN AEROBIC  AND ANAEROBIC Blood Culture adequate volume   Culture      NO GROWTH < 24 HOURS Performed at [facility], [address] [place], [place] [address]    Report Status PENDING   CULTURE, BLOOD (ROUTINE X 2) w Reflex to ID Panel     Status: None (Preliminary result)   Collection Time: 09/25/21  6:12 AM   Specimen: BLOOD  Result Value Ref Range   Specimen Description BLOOD RIGHT HAND    Special Requests      BOTTLES DRAWN AEROBIC AND ANAEROBIC Blood Culture adequate volume   Culture      NO GROWTH < 24 HOURS Performed at [facility], [address], [place], [place] [address]    Report Status PENDING   Glucose, capillary     Status: Abnormal   Collection Time: 09/25/21  9:17 AM  Result Value Ref Range   Glucose-Capillary 131 (H) 70 - 99 mg/dL    Comment: Glucose reference range applies only to samples taken after fasting for at least 8 hours.  Glucose, capillary     Status: Abnormal   Collection Time: 09/25/21  1:03 PM  Result Value Ref Range   Glucose-Capillary 159 (H) 70 - 99 mg/dL    Comment: Glucose reference range applies only to samples taken after fasting for at least 8 hours.  Glucose, capillary     Status: Abnormal   Collection Time: 09/25/21  4:05 PM  Result Value Ref Range   Glucose-Capillary 150 (H) 70 - 99 mg/dL    Comment: Glucose reference range applies only to  samples taken after fasting for at least 8 hours.  Glucose, capillary     Status: Abnormal   Collection Time: 09/25/21  9:20 PM  Result Value Ref Range   Glucose-Capillary 126 (H) 70 - 99 mg/dL    Comment: Glucose reference range applies only to samples taken after fasting for at least 8 hours.  CBC     Status: Abnormal   Collection Time: 09/26/21  4:55 AM  Result Value Ref Range   WBC 18.1 (H) 4.0 - 10.5 K/uL   RBC 2.81 (L) 4.22 - 5.81 MIL/uL   Hemoglobin 7.9 (L) 13.0 - 17.0 g/dL   HCT 23.7 (L) 39.0 - 52.0 %   MCV 84.3 80.0 - 100.0 fL   MCH 28.1 26.0 - 34.0 pg   MCHC 33.3 30.0 - 36.0 g/dL   RDW 13.7 11.5 - 15.5 %   Platelets 309 150 - 400 K/uL   nRBC 0.0 0.0 - 0.2 %    Comment: Performed at [facility], [address], [place],  [address]  Basic metabolic panel     Status: Abnormal   Collection Time: 09/26/21  4:55 AM  Result Value Ref Range   Sodium 134 (L) 135 - 145 mmol/L   Potassium 3.6 3.5 - 5.1 mmol/L   Chloride 95 (L) 98 - 111 mmol/L   CO2 24 22 - 32 mmol/L   Glucose, Bld 224 (H) 70 - 99 mg/dL    Comment: Glucose reference range applies only to samples taken after fasting for at least 8 hours.   BUN 89 (H) 6 - 20 mg/dL   Creatinine, Ser 18.71 (H) 0.61 - 1.24 mg/dL   Calcium 8.7 (L) 8.9 - 10.3 mg/dL   GFR, Estimated 3 (L) >60 mL/min    Comment: (NOTE) Calculated using the CKD-EPI Creatinine Equation (2021)    Anion gap 15 5 - 15  Comment: Performed at [facility], [address] [place], [place] [address]  Glucose, capillary     Status: Abnormal   Collection Time: 09/26/21  8:59 AM  Result Value Ref Range   Glucose-Capillary 181 (H) 70 - 99 mg/dL    Comment: Glucose reference range applies only to samples taken after fasting for at least 8 hours.  Glucose, capillary     Status: Abnormal   Collection Time: 09/26/21 12:14 PM  Result Value Ref Range   Glucose-Capillary 125 (H) 70 - 99 mg/dL    Comment: Glucose reference range applies  only to samples taken after fasting for at least 8 hours.   US Venous Img Lower Unilateral Left (DVT)  Result Date: 09/25/2021 CLINICAL DATA:  Left lower extremity pain for the past 3 weeks. Evaluate for DVT. EXAM: LEFT LOWER EXTREMITY VENOUS DOPPLER ULTRASOUND TECHNIQUE: Gray-scale sonography with graded compression, as well as color Doppler and duplex ultrasound were performed to evaluate the lower extremity deep venous systems from the level of the common femoral vein and including the common femoral, femoral, profunda femoral, popliteal and calf veins including the posterior tibial, peroneal and gastrocnemius veins when visible. The superficial great saphenous vein was also interrogated. Spectral Doppler was utilized to evaluate flow at rest and with distal augmentation maneuvers in the common femoral, femoral and popliteal veins. COMPARISON:  CT the chest, abdomen and pelvis-09/22/2021 FINDINGS: Contralateral Common Femoral Vein: Respiratory phasicity is normal and symmetric with the symptomatic side. No evidence of thrombus. Normal compressibility. Common Femoral Vein: No evidence of thrombus. Normal compressibility, respiratory phasicity and response to augmentation. Saphenofemoral Junction: No evidence of thrombus. Normal compressibility and flow on color Doppler imaging. Profunda Femoral Vein: No evidence of thrombus. Normal compressibility and flow on color Doppler imaging. Femoral Vein: No evidence of thrombus. Normal compressibility, respiratory phasicity and response to augmentation. Popliteal Vein: No evidence of thrombus. Normal compressibility, respiratory phasicity and response to augmentation. Calf Veins: No evidence of thrombus. Normal compressibility and flow on color Doppler imaging. Superficial Great Saphenous Vein: No evidence of thrombus. Normal compressibility. Venous Reflux:  None. Other Findings: Note is made of mildly prominent though non pathologically enlarged left inguinal lymph  nodes with index left inguinal lymph node measuring 1.1 cm in greatest short axis diameter (image 14). Borderline enlarged right inguinal lymph node measures 1.5 cm in greatest short axis diameter with partial thickening on its cortex though maintenance of a benign fatty hilum (image 40). IMPRESSION: 1. No evidence of DVT within the left lower extremity. 2. Prominent bilateral inguinal lymph nodes, right greater than left, similar to recent abdominal CT, nonspecific though presumably reactive in etiology in the setting peritoneal dialysis. Clinical correlation is advised. Electronically Signed   By: Sandi Mariscal M.D.   On: 09/25/2021 12:44    Blood pressure (!) 141/70, pulse 82, temperature 99.3 F (37.4 C), temperature source Oral, resp. rate 14, height 6' 6"  (1.981 m), weight 104.3 kg, SpO2 100 %.   Assessment Bacteremia/sepsis secondary to septic joint left first metatarsal phalangeal joint with associated abscess and cellulitis Diabetes type 1 with polyneuropathy PVD HIV  Plan -Patient seen and examined. -X-ray imaging and MRI imaging reviewed and discussed with patient in detail.  Appears to show a large abscess present over the first metatarsal phalangeal joint and within the joint concerning for septic joint as well as osteomyelitis of the first ray.  Appears as though the abscess continues along the flexor tendon area around the medial instep and arch  around the medial aspect of the calcaneus, does not appear to be present proximal to the ankle joint. -Exsanguinated abscess today at the first metatarsal phalangeal joint removing a large amount of purulent drainage from the area.  Skin appears to be somewhat thin and atrophic over the area.  Culture was taken. -Betadine wet-to-dry dressing placed. -Blood culture grew MRSA upon admission. -Discussed seriousness of condition.  Discussed treatment options with patient.  Discussed all treatment options with patient both conservative and surgical  attempts at correction including potential risks and complications.  At this time patient has elected for procedure on the left foot consisting of incision and drainage of the left foot and ankle with removal of all necrotic and nonviable tissue, partial first ray amputation with antibiotic bead applications and possible transmetatarsal amputation depending on severity of infection present during surgery.  All questions answered including postoperative course.  Discussed with patient that this may be a staged type procedure in which we may try to salvage the remaining rays to the foot but ultimately he may require transmetatarsal amputation if infection appears to be unresolving or worsening.  Patient understands.  Patient is at high risk for limb loss due to condition.  Patient understands gravity of situation. -N.p.o. order placed for midnight tonight.  Plan for surgery tomorrow around 8 in the morning. -ABIs also reviewed.  Patient appears to have inflated values and noncompressible vessels on the left side as well as monophasic type pulses consistent with peripheral vascular disease.  Consult placed for vascular for further work-up.  Caroline More, DPM 09/26/2021, 3:30 PM

## 2021-09-26 NOTE — Progress Notes (Addendum)
PROGRESS NOTE    Nathan Ayers  SJG:[id number] DOB: January 05, 1971 DOA: 09/22/2021 PCP: Judeth Cornfield, MD   Chief Complaint  Patient presents with   URI    Brief Narrative:   Nathan Ayers is a 50 y.o. male with medical history significant for HIV compliant w/ antiretrovirals, t1dm, ESRD on peritoneal dialysis, OSA not on cpap, and htn, who presents with the above.   Symptoms began about 2 weeks ago. Developed throat pain and went to a local ED/urgent care where was diagnosed with strep throat and given penicillin. Throat pain has resolved, has no pain or difficulty with swallowing. But has developed subjective fever (now resolved) and chills that continue. Also nausea with one episode of vomiting yesterday. Also loose non-bloody stools. Also mild cough and subjective shortness of breath. Does not make urine. No headache or ear ache. Compliant with antiretrovirals. No skin lesions. No abdominal pain   Assessment & Plan:   Principal Problem:   Leukocytosis Active Problems:   Malnutrition of moderate degree   Type 1 diabetes (HCC)   Essential hypertension   HIV disease (HCC)   ESRD on dialysis (HCC)   OSA (obstructive sleep apnea)  MRSA Bacteremia -Patient with leukocytosis, feeling ill, chills, generalized body ache. -2/3 blood culture growing MRSA -Continue with IV vancomycin -Follow on peritoneal fluid culture and Gram stain, so far no growth -2D echo with no evidence of vegetations, as well TEE has been done by Dr. Rockey Situ  with no evidence of vegetation as well -ID consult greatly appreciated -Follow on repeat blood cultures done 11/18, so far remains negative -Patient remained febrile despite being few days on IV vancomycin. -This morning patient started to have purulent drainage from anterior left foot, this is new and developed overnight, likely this is the source of his infection, will obtain MRI, [facility] to evaluate patient today. -Leukocytosis trending  down   Left foot infection -Please see above discussion.  Hypertension -Overall blood pressure has been on the lower side, patient is on multiple home medications, all has been discontinued including losartan, Norvasc and minoxidil, he remains on low-dose Coreg.    Anemia of chronic kidney disease -Continue to monitor CBC closely, Aranesp per renal once appropriate  ESRD - On peritoneal dialysis says is compliant w/ that - nephrology consulted   OSA Not on cpap at home   T1DM Says not on insulin since dialysis started - SSI very sensitive    HIV Says compliant w/ meds. Followed by [facility]. Viral load undetectable in September of this year. Cd4 55 in march of this year - continue ziagen, tivicay, epivir - f/u cd4  Left lower extremity Dopplers with no evidence of DVT   DVT prophylaxis: Heparin Code Status: Full Family Communication: Mother at bedside, did not discuss his HIV status in front of her. Disposition:   Status is: inpatient  The patient will require care spanning > 2 midnights and should be moved to inpatient because: Bacteremia and need of IV antibiotics      Consultants:  Renal ID [facility] for TEE.  Procedures -TEE 11/17.  Subjective:  patient remains with intermittent fever, report generalized weakness, fatigue, overnight he did develop some draining from the left foot.  Objective: Vitals:   09/25/21 1604 09/25/21 2005 09/26/21 0510 09/26/21 0838  BP: 128/60 120/69 112/60 (!) 141/70  Pulse: 81 77 82 82  Resp: 18 18 18 14   Temp: 98.3 F (36.8 C) 98.3 F (36.8 C) 98.5 F (36.9 C) (!)  100.5 F (38.1 C)  TempSrc:      SpO2: 100% 93% 100% 100%  Weight:      Height:       No intake or output data in the 24 hours ending 09/26/21 1442  Filed Weights   09/22/21 1123 09/24/21 1333  Weight: 104.3 kg 104.3 kg    Examination:  Awake Alert, Oriented X 3, No new F.N deficits, Normal affect Symmetrical Chest wall movement, Good air movement  bilaterally, CTAB RRR,No Gallops,Rubs or new Murmurs, No Parasternal Heave +ve B.Sounds, Abd Soft, No tenderness, No rebound - guarding or rigidity. No cyanosis, left foot with anterior draining , please see pictures below       Data Reviewed: I have personally reviewed following labs and imaging studies  CBC: Recent Labs  Lab 09/22/21 1332 09/23/21 0445 09/23/21 0643 09/24/21 0552 09/26/21 0455  WBC 25.9* 29.9* 25.7* 21.4* 18.1*  NEUTROABS 20.7*  --  21.7*  --   --   HGB 9.2* 8.3* 9.1* 8.5* 7.9*  HCT 26.2* 23.9* 25.6* 24.4* 23.7*  MCV 84.8 84.5 82 82.2 84.3  PLT 303 334 330 297 761    Basic Metabolic Panel: Recent Labs  Lab 09/22/21 1332 09/23/21 0445 09/24/21 0552 09/26/21 0455  NA 132* 134* 133* 134*  K 4.4 4.5 3.4* 3.6  CL 91* 92* 94* 95*  CO2 20* 22 22 24   GLUCOSE 122* 147* 215* 224*  BUN 113* 106* 97* 89*  CREATININE 23.91* 24.45* 20.61* 18.71*  CALCIUM 9.3 9.3 8.7* 8.7*    GFR: Estimated Creatinine Clearance: 6.2 mL/min (A) (by C-G formula based on SCr of 18.71 mg/dL (H)).  Liver Function Tests: Recent Labs  Lab 09/22/21 1332 09/23/21 0445  AST 12* 14*  ALT 15 14  ALKPHOS 73 73  BILITOT 1.0 0.7  PROT 8.5* 7.9  ALBUMIN 2.6* 2.4*    CBG: Recent Labs  Lab 09/25/21 1303 09/25/21 1605 09/25/21 2120 09/26/21 0859 09/26/21 1214  GLUCAP 159* 150* 126* 181* 125*     Recent Results (from the past 240 hour(s))  Resp Panel by RT-PCR (Flu A&B, Covid) Nasopharyngeal Swab     Status: None   Collection Time: 09/22/21 11:30 AM   Specimen: Nasopharyngeal Swab; Nasopharyngeal(NP) swabs in vial transport medium  Result Value Ref Range Status   SARS Coronavirus 2 by RT PCR NEGATIVE NEGATIVE Final    Comment: (NOTE) SARS-CoV-2 target nucleic acids are NOT DETECTED.  The SARS-CoV-2 RNA is generally detectable in upper respiratory specimens during the acute phase of infection. The lowest concentration of SARS-CoV-2 viral copies this assay can detect  is 138 copies/mL. A negative result does not preclude SARS-Cov-2 infection and should not be used as the sole basis for treatment or other patient management decisions. A negative result may occur with  improper specimen collection/handling, submission of specimen other than nasopharyngeal swab, presence of viral mutation(s) within the areas targeted by this assay, and inadequate number of viral copies(<138 copies/mL). A negative result must be combined with clinical observations, patient history, and epidemiological information. The expected result is Negative.  Fact Sheet for Patients:  EntrepreneurPulse.com.au  Fact Sheet for Healthcare Providers:  IncredibleEmployment.be  This test is no t yet approved or cleared by the [place] FDA and  has been authorized for detection and/or diagnosis of SARS-CoV-2 by FDA under an Emergency Use Authorization (EUA). This EUA will remain  in effect (meaning this test can be used) for the duration of the COVID-19 declaration under Section 564(b)(1) of  the Act, 21 U.S.C.section 360bbb-3(b)(1), unless the authorization is terminated  or revoked sooner.       Influenza A by PCR NEGATIVE NEGATIVE Final   Influenza B by PCR NEGATIVE NEGATIVE Final    Comment: (NOTE) The Xpert Xpress SARS-CoV-2/FLU/RSV plus assay is intended as an aid in the diagnosis of influenza from Nasopharyngeal swab specimens and should not be used as a sole basis for treatment. Nasal washings and aspirates are unacceptable for Xpert Xpress SARS-CoV-2/FLU/RSV testing.  Fact Sheet for Patients: EntrepreneurPulse.com.au  Fact Sheet for Healthcare Providers: IncredibleEmployment.be  This test is not yet approved or cleared by the [place] FDA and has been authorized for detection and/or diagnosis of SARS-CoV-2 by FDA under an Emergency Use Authorization (EUA). This EUA will remain in effect  (meaning this test can be used) for the duration of the COVID-19 declaration under Section 564(b)(1) of the Act, 21 U.S.C. section 360bbb-3(b)(1), unless the authorization is terminated or revoked.  Performed at [facility], [place], [place] [address]   Respiratory (~20 pathogens) panel by PCR     Status: None   Collection Time: 09/22/21 11:30 AM   Specimen: Peritoneal Washings; Respiratory  Result Value Ref Range Status   Adenovirus NOT DETECTED NOT DETECTED Final   Coronavirus 229E NOT DETECTED NOT DETECTED Final    Comment: (NOTE) The Coronavirus on the Respiratory Panel, DOES NOT test for the novel  Coronavirus (2019 nCoV)    Coronavirus HKU1 NOT DETECTED NOT DETECTED Final   Coronavirus NL63 NOT DETECTED NOT DETECTED Final   Coronavirus OC43 NOT DETECTED NOT DETECTED Final   Metapneumovirus NOT DETECTED NOT DETECTED Final   Rhinovirus / Enterovirus NOT DETECTED NOT DETECTED Final   Influenza A NOT DETECTED NOT DETECTED Final   Influenza B NOT DETECTED NOT DETECTED Final   Parainfluenza Virus 1 NOT DETECTED NOT DETECTED Final   Parainfluenza Virus 2 NOT DETECTED NOT DETECTED Final   Parainfluenza Virus 3 NOT DETECTED NOT DETECTED Final   Parainfluenza Virus 4 NOT DETECTED NOT DETECTED Final   Respiratory Syncytial Virus NOT DETECTED NOT DETECTED Final   Bordetella pertussis NOT DETECTED NOT DETECTED Final   Bordetella Parapertussis NOT DETECTED NOT DETECTED Final   Chlamydophila pneumoniae NOT DETECTED NOT DETECTED Final   Mycoplasma pneumoniae NOT DETECTED NOT DETECTED Final    Comment: Performed at [facility], [place]. [address] [place], [place] [address]  Blood Culture (routine x 2)     Status: Abnormal (Preliminary result)   Collection Time: 09/22/21  1:32 PM   Specimen: BLOOD  Result Value Ref Range Status   Specimen Description   Final    BLOOD LEFT ANTECUBITAL Performed at [facility], [address] [place], [place]  [address]    Special Requests   Final    BOTTLES DRAWN AEROBIC AND ANAEROBIC Blood Culture results may not be optimal due to an excessive volume of blood received in culture bottles Performed at [facility], [address] [place], [place] [address]    Culture  Setup Time   Final    GRAM POSITIVE COCCI ANAEROBIC BOTTLE ONLY CRITICAL VALUE NOTED.  VALUE IS CONSISTENT WITH PREVIOUSLY REPORTED AND CALLED VALUE.    Culture (A)  Final    STAPHYLOCOCCUS AUREUS SUSCEPTIBILITIES PERFORMED ON PREVIOUS CULTURE WITHIN THE LAST 5 DAYS. Performed at [facility], [place] [address] [place], [place] [address]    Report Status PENDING  Incomplete  Blood Culture (routine x  2)     Status: Abnormal   Collection Time: 09/22/21  1:32 PM   Specimen: BLOOD  Result Value Ref Range Status   Specimen Description   Final    BLOOD LEFT ANTECUBITAL Performed at [facility], [address] [place], [place] [address]    Special Requests   Final    BOTTLES DRAWN AEROBIC AND ANAEROBIC Blood Culture adequate volume Performed at [facility], [address] [place], [place] [address]    Culture  Setup Time   Final    GRAM POSITIVE COCCI IN BOTH AEROBIC AND ANAEROBIC BOTTLES Organism ID to follow CRITICAL RESULT CALLED TO, READ BACK BY AND VERIFIED WITH: Ardeen Garland, PHARMD AT 0725 ON 09/23/21 BY GM Performed at [facility], [address] [place], [place] [address]    Culture METHICILLIN RESISTANT STAPHYLOCOCCUS AUREUS (A)  Final   Report Status 09/25/2021 FINAL  Final   Organism ID, Bacteria METHICILLIN RESISTANT STAPHYLOCOCCUS AUREUS  Final      Susceptibility   Methicillin resistant staphylococcus aureus - MIC*    CIPROFLOXACIN 2 INTERMEDIATE Intermediate     ERYTHROMYCIN >=8 RESISTANT Resistant     GENTAMICIN <=0.5 SENSITIVE Sensitive     OXACILLIN >=4 RESISTANT Resistant     TETRACYCLINE <=1 SENSITIVE Sensitive     VANCOMYCIN 1 SENSITIVE Sensitive      TRIMETH/SULFA <=10 SENSITIVE Sensitive     CLINDAMYCIN <=0.25 SENSITIVE Sensitive     RIFAMPIN <=0.5 SENSITIVE Sensitive     Inducible Clindamycin NEGATIVE Sensitive     * METHICILLIN RESISTANT STAPHYLOCOCCUS AUREUS  Blood Culture ID Panel (Reflexed)     Status: Abnormal   Collection Time: 09/22/21  1:32 PM  Result Value Ref Range Status   Enterococcus faecalis NOT DETECTED NOT DETECTED Final   Enterococcus Faecium NOT DETECTED NOT DETECTED Final   Listeria monocytogenes NOT DETECTED NOT DETECTED Final   Staphylococcus species DETECTED (A) NOT DETECTED Final    Comment: CRITICAL RESULT CALLED TO, READ BACK BY AND VERIFIED WITH: MORGAN HICKS, PHARMD AT 0725 ON 09/23/21 BY GM    Staphylococcus aureus (BCID) DETECTED (A) NOT DETECTED Final    Comment: Methicillin (oxacillin)-resistant Staphylococcus aureus (MRSA). MRSA is predictably resistant to beta-lactam antibiotics (except ceftaroline). Preferred therapy is vancomycin unless clinically contraindicated. Patient requires contact precautions if  hospitalized. CRITICAL RESULT CALLED TO, READ BACK BY AND VERIFIED WITH: MORGAN HICKS, PHARMD AT 0725 ON 09/23/21 BY GM    Staphylococcus epidermidis NOT DETECTED NOT DETECTED Final   Staphylococcus lugdunensis NOT DETECTED NOT DETECTED Final   Streptococcus species NOT DETECTED NOT DETECTED Final   Streptococcus agalactiae NOT DETECTED NOT DETECTED Final   Streptococcus pneumoniae NOT DETECTED NOT DETECTED Final   Streptococcus pyogenes NOT DETECTED NOT DETECTED Final   A.calcoaceticus-baumannii NOT DETECTED NOT DETECTED Final   Bacteroides fragilis NOT DETECTED NOT DETECTED Final   Enterobacterales NOT DETECTED NOT DETECTED Final   Enterobacter cloacae complex NOT DETECTED NOT DETECTED Final   Escherichia coli NOT DETECTED NOT DETECTED Final   Klebsiella aerogenes NOT DETECTED NOT DETECTED Final   Klebsiella oxytoca NOT DETECTED NOT DETECTED Final   Klebsiella pneumoniae NOT DETECTED NOT  DETECTED Final   Proteus species NOT DETECTED NOT DETECTED Final   Salmonella species NOT DETECTED NOT DETECTED Final   Serratia marcescens NOT DETECTED NOT DETECTED Final   Haemophilus influenzae NOT DETECTED NOT DETECTED Final   Neisseria meningitidis NOT DETECTED NOT DETECTED Final   Pseudomonas aeruginosa NOT DETECTED NOT DETECTED Final  Stenotrophomonas maltophilia NOT DETECTED NOT DETECTED Final   Candida albicans NOT DETECTED NOT DETECTED Final   Candida auris NOT DETECTED NOT DETECTED Final   Candida glabrata NOT DETECTED NOT DETECTED Final   Candida krusei NOT DETECTED NOT DETECTED Final   Candida parapsilosis NOT DETECTED NOT DETECTED Final   Candida tropicalis NOT DETECTED NOT DETECTED Final   Cryptococcus neoformans/gattii NOT DETECTED NOT DETECTED Final   Meth resistant mecA/C and MREJ DETECTED (A) NOT DETECTED Final    Comment: CRITICAL RESULT CALLED TO, READ BACK BY AND VERIFIED WITH: Ardeen Garland, PHARMD AT 0725 ON 09/23/21 BY GM Performed at [facility], [place], [place] [address]   Group A Strep by PCR [facility] Only)     Status: None   Collection Time: 09/22/21  1:40 PM   Specimen: Throat; Sterile Swab  Result Value Ref Range Status   Group A Strep by PCR NOT DETECTED NOT DETECTED Final    Comment: Performed at [facility], [address] [place], [place] [address]  Peritoneal fluid culture w Gram Stain     Status: None   Collection Time: 09/22/21  7:29 PM   Specimen: Peritoneal Washings; Peritoneal Fluid  Result Value Ref Range Status   Specimen Description   Final    PERITONEAL Performed at [facility], [address] [place], [place] [address]    Special Requests   Final    NONE Performed at [facility], [address] [place], [place] [address]    Gram Stain   Final    FEW WBC PRESENT, PREDOMINANTLY MONONUCLEAR NO ORGANISMS SEEN    Culture   Final    NO GROWTH 3 DAYS Performed at [facility], [place] [address] [place], [place] [address]    Report Status 09/26/2021 FINAL  Final  Gastrointestinal Panel by PCR , Stool     Status: None   Collection Time: 09/23/21  4:45 AM   Specimen: Stool  Result Value Ref Range Status   Campylobacter species NOT DETECTED NOT DETECTED Final   Plesimonas shigelloides NOT DETECTED NOT DETECTED Final   Salmonella species NOT DETECTED NOT DETECTED Final   Yersinia enterocolitica NOT DETECTED NOT DETECTED Final   Vibrio species NOT DETECTED NOT DETECTED Final   Vibrio cholerae NOT DETECTED NOT DETECTED Final   Enteroaggregative E coli (EAEC) NOT DETECTED NOT DETECTED Final   Enteropathogenic E coli (EPEC) NOT DETECTED NOT DETECTED Final   Enterotoxigenic E coli (ETEC) NOT DETECTED NOT DETECTED Final   Shiga like toxin producing E coli (STEC) NOT DETECTED NOT DETECTED Final   Shigella/Enteroinvasive E coli (EIEC) NOT DETECTED NOT DETECTED Final   Cryptosporidium NOT DETECTED NOT DETECTED Final   Cyclospora cayetanensis NOT DETECTED NOT DETECTED Final   Entamoeba histolytica NOT DETECTED NOT DETECTED Final   Giardia lamblia NOT DETECTED NOT DETECTED Final   Adenovirus F40/41 NOT DETECTED NOT DETECTED Final   Astrovirus NOT DETECTED NOT DETECTED Final   Norovirus GI/GII NOT DETECTED NOT DETECTED Final   Rotavirus A NOT DETECTED NOT DETECTED Final   Sapovirus (I, II, IV, and V) NOT DETECTED NOT DETECTED Final    Comment: Performed at [facility], [address] [place], [place] [address]  C Difficile Quick Screen w PCR reflex     Status: None   Collection Time: 09/23/21  4:45 AM   Specimen: Stool  Result Value Ref Range Status   C Diff antigen NEGATIVE NEGATIVE Final  C Diff toxin NEGATIVE NEGATIVE Final   C Diff interpretation No C. difficile detected.  Final    Comment: Performed at [facility], [address] [place], [place] [address]  CULTURE, BLOOD (ROUTINE X 2) w Reflex to ID Panel     Status: None  (Preliminary result)   Collection Time: 09/25/21  6:12 AM   Specimen: BLOOD  Result Value Ref Range Status   Specimen Description BLOOD LEFT ARM  Final   Special Requests   Final    BOTTLES DRAWN AEROBIC AND ANAEROBIC Blood Culture adequate volume   Culture   Final    NO GROWTH < 24 HOURS Performed at [facility], [address], [place], [place] [address]    Report Status PENDING  Incomplete  CULTURE, BLOOD (ROUTINE X 2) w Reflex to ID Panel     Status: None (Preliminary result)   Collection Time: 09/25/21  6:12 AM   Specimen: BLOOD  Result Value Ref Range Status   Specimen Description BLOOD RIGHT HAND  Final   Special Requests   Final    BOTTLES DRAWN AEROBIC AND ANAEROBIC Blood Culture adequate volume   Culture   Final    NO GROWTH < 24 HOURS Performed at [facility], [address] [place],  [address]    Report Status PENDING  Incomplete         Radiology Studies: US Venous Img Lower Unilateral Left (DVT)  Result Date: 09/25/2021 CLINICAL DATA:  Left lower extremity pain for the past 3 weeks. Evaluate for DVT. EXAM: LEFT LOWER EXTREMITY VENOUS DOPPLER ULTRASOUND TECHNIQUE: Gray-scale sonography with graded compression, as well as color Doppler and duplex ultrasound were performed to evaluate the lower extremity deep venous systems from the level of the common femoral vein and including the common femoral, femoral, profunda femoral, popliteal and calf veins including the posterior tibial, peroneal and gastrocnemius veins when visible. The superficial great saphenous vein was also interrogated. Spectral Doppler was utilized to evaluate flow at rest and with distal augmentation maneuvers in the common femoral, femoral and popliteal veins. COMPARISON:  CT the chest, abdomen and pelvis-09/22/2021 FINDINGS: Contralateral Common Femoral Vein: Respiratory phasicity is normal and symmetric with the symptomatic side. No evidence of thrombus. Normal  compressibility. Common Femoral Vein: No evidence of thrombus. Normal compressibility, respiratory phasicity and response to augmentation. Saphenofemoral Junction: No evidence of thrombus. Normal compressibility and flow on color Doppler imaging. Profunda Femoral Vein: No evidence of thrombus. Normal compressibility and flow on color Doppler imaging. Femoral Vein: No evidence of thrombus. Normal compressibility, respiratory phasicity and response to augmentation. Popliteal Vein: No evidence of thrombus. Normal compressibility, respiratory phasicity and response to augmentation. Calf Veins: No evidence of thrombus. Normal compressibility and flow on color Doppler imaging. Superficial Great Saphenous Vein: No evidence of thrombus. Normal compressibility. Venous Reflux:  None. Other Findings: Note is made of mildly prominent though non pathologically enlarged left inguinal lymph nodes with index left inguinal lymph node measuring 1.1 cm in greatest short axis diameter (image 14). Borderline enlarged right inguinal lymph node measures 1.5 cm in greatest short axis diameter with partial thickening on its cortex though maintenance of a benign fatty hilum (image 40). IMPRESSION: 1. No evidence of DVT within the left lower extremity. 2. Prominent bilateral inguinal lymph nodes, right greater than left, similar to recent abdominal CT, nonspecific though presumably reactive in etiology in the setting peritoneal dialysis. Clinical correlation is advised. Electronically Signed   By: Eldridge Abrahams.D.  On: 09/25/2021 12:44        Scheduled Meds:  abacavir  300 mg Oral BID   carvedilol  12.5 mg Oral BID   dolutegravir  50 mg Oral QHS   feeding supplement (NEPRO CARB STEADY)  237 mL Oral TID BM   gentamicin cream  1 application Topical Daily   heparin  5,000 Units Subcutaneous Q8H   insulin aspart  0-6 Units Subcutaneous TID WC   lamiVUDine  25 mg Oral QHS   multivitamin  1 tablet Oral Daily   potassium chloride   20 mEq Oral Daily   sodium chloride flush  3 mL Intravenous Q12H   vancomycin variable dose per unstable renal function (pharmacist dosing)   Does not apply See admin instructions   Continuous Infusions:  sodium chloride     dialysis solution 1.5% low-MG/low-CA     dialysis solution 2.5% low-MG/low-CA       LOS: 2 days       Phillips Climes, MD [facility]   To contact the attending provider between 7A-7P or the covering provider during after hours 7P-7A, please log into the web site [email or web address] and access using universal [place] password for that web site. If you do not have the password, please call the hospital operator.  09/26/2021, 2:42 PM

## 2021-09-26 NOTE — Consult Note (Signed)
Reason for Consult:Left diabetic foot infection /gangrene/sepsis/ESRD/PAD Referring Physician: Pearline Cables, DPM  Nathan Ayers is an 50 y.o. male.  HPI: This is a 50 year old male with left foot abscess and sepsis.  He has known PAD with decreased flow in the left leg compared to there right.  Past Medical History:  Diagnosis Date   Anxiety    Chronic kidney disease (CKD), active medical management without dialysis    Mon, Wed and Fri   Chronic kidney disease on chronic dialysis ([place])    Diabetes mellitus ([place])    Diabetic neuropathy ([place])    Dyspnea    HIV infection ([place])    Hypertension     Past Surgical History:  Procedure Laterality Date   AMPUTATION TOE Left 02/04/2017   Procedure: AMPUTATION 5TH METATARSAL AND JOINT;  Surgeon: Samara Deist, DPM;  Location: [facility];  Service: Podiatry;  Laterality: Left;   APPLICATION OF WOUND VAC Left 05/20/2017   Procedure: APPLICATION OF WOUND VAC;  Surgeon: Samara Deist, DPM;  Location: [facility];  Service: Podiatry;  Laterality: Left;   AV FISTULA PLACEMENT     BONE EXCISION Left 02/17/2018   Procedure: BONE EXCISION METATARSAL;  Surgeon: Samara Deist, DPM;  Location: [facility];  Service: Podiatry;  Laterality: Left;   INCISION AND DRAINAGE OF WOUND Left 05/20/2017   Procedure: IRRIGATION AND DEBRIDEMENT WOUND;  Surgeon: Samara Deist, DPM;  Location: [facility];  Service: Podiatry;  Laterality: Left;   TEE WITHOUT CARDIOVERSION N/A 09/24/2021   Procedure: TRANSESOPHAGEAL ECHOCARDIOGRAM (TEE);  Surgeon: [name], MD;  Location: [facility];  Service: Cardiovascular;  Laterality: N/A;    Family History  Problem Relation Age of Onset   Diabetes Mother    Hypertension Mother     Social History:  reports that he has never smoked. He has never used smokeless tobacco. He reports that he does not drink alcohol and does not use drugs.  Allergies:  Allergies  Allergen Reactions   Lactose Intolerance (Gi)     Medications: I have  reviewed the patient's current medications. Prior to Admission:  Medications Prior to Admission  Medication Sig Dispense Refill Last Dose   abacavir (ZIAGEN) 300 MG tablet Take 300 mg by mouth 2 (two) times daily.    09/21/2021   amLODipine (NORVASC) 5 MG tablet Take 5 mg by mouth daily.   09/21/2021   carvedilol (COREG) 25 MG tablet Take 25 mg by mouth 2 (two) times daily.   9 09/21/2021   lamiVUDine (EPIVIR) 10 MG/ML solution Take 2.5 mLs by mouth at bedtime.   3 09/21/2021   lanthanum (FOSRENOL) 1000 MG chewable tablet Chew 2,000 mg by mouth 3 (three) times daily.   09/21/2021   minoxidil (LONITEN) 2.5 MG tablet Take 2.5 mg by mouth See admin instructions. TAKES TWICE DAILY, EXCEPT FOR THE AM DOSE ON DIALYSIS DAYS; MON, WED, FRI  3 09/21/2021   ondansetron (ZOFRAN-ODT) 4 MG disintegrating tablet Take 4 mg by mouth every 8 (eight) hours as needed for nausea or vomiting.    Past Month   SANTYL ointment Apply 1 application topically daily. APPLIES TO FOOT USING GAUZE THEN WRAPS FOOT  2 Past Week   TIVICAY 50 MG tablet Take 50 mg by mouth 2 (two) times daily.   3 09/21/2021   cinacalcet (SENSIPAR) 30 MG tablet Take 30 mg by mouth daily.      losartan (COZAAR) 100 MG tablet Take 100 mg by mouth daily.  losartan (COZAAR) 100 MG tablet Take 100 mg by mouth 1 day or 1 dose.      Nutritional Supplements (FEEDING SUPPLEMENT, NEPRO CARB STEADY,) LIQD Take 237 mLs by mouth as needed (missed meal during dialysis.). 30 Can 0    Scheduled:  abacavir  300 mg Oral BID   carvedilol  12.5 mg Oral BID   dolutegravir  50 mg Oral QHS   feeding supplement (NEPRO CARB STEADY)  237 mL Oral TID BM   gentamicin cream  1 application Topical Daily   heparin  5,000 Units Subcutaneous Q8H   insulin aspart  0-6 Units Subcutaneous TID WC   lamiVUDine  25 mg Oral QHS   multivitamin  1 tablet Oral Daily   potassium chloride  20 mEq Oral Daily   sodium chloride flush  3 mL Intravenous Q12H   vancomycin variable  dose per unstable renal function (pharmacist dosing)   Does not apply See admin instructions   Continuous:  sodium chloride     dialysis solution 1.5% low-MG/low-CA     dialysis solution 2.5% low-MG/low-CA     HBZ:JIRCVE chloride, acetaminophen, sodium chloride flush Anti-infectives (From admission, onward)    Start     Dose/Rate Route Frequency Ordered Stop   09/24/21 2200  dolutegravir (TIVICAY) tablet 50 mg        50 mg Oral Daily at bedtime 09/23/21 2315     09/23/21 1349  vancomycin variable dose per unstable renal function (pharmacist dosing)         Does not apply See admin instructions 09/23/21 1349     09/23/21 1000  vancomycin (VANCOREADY) IVPB 1500 mg/300 mL        1,500 mg 150 mL/hr over 120 Minutes Intravenous  Once 09/23/21 0908 09/23/21 1509   09/22/21 2200  lamiVUDine (EPIVIR) 10 MG/ML solution 25 mg        25 mg Oral Daily at bedtime 09/22/21 1727     09/22/21 2200  dolutegravir (TIVICAY) tablet 50 mg  Status:  Discontinued        50 mg Oral 2 times daily 09/22/21 1727 09/23/21 2315   09/22/21 2200  abacavir (ZIAGEN) tablet 300 mg        300 mg Oral 2 times daily 09/22/21 1727     09/22/21 1500  ceFEPIme (MAXIPIME) 2 g in sodium chloride 0.9 % 100 mL IVPB        2 g 200 mL/hr over 30 Minutes Intravenous  Once 09/22/21 1447 09/22/21 1919   09/22/21 1500  metroNIDAZOLE (FLAGYL) IVPB 500 mg        500 mg 100 mL/hr over 60 Minutes Intravenous  Once 09/22/21 1447 09/22/21 1919   09/22/21 1500  vancomycin (VANCOCIN) IVPB 1000 mg/200 mL premix        1,000 mg 200 mL/hr over 60 Minutes Intravenous  Once 09/22/21 1447 09/22/21 1919       Results for orders placed or performed during the hospital encounter of 09/22/21 (from the past 48 hour(s))  Glucose, capillary     Status: Abnormal   Collection Time: 09/24/21  9:11 PM  Result Value Ref Range   Glucose-Capillary 212 (H) 70 - 99 mg/dL    Comment: Glucose reference range applies only to samples taken after fasting for  at least 8 hours.  Vancomycin, random     Status: None   Collection Time: 09/25/21  6:12 AM  Result Value Ref Range   Vancomycin Rm 27     Comment:  Random Vancomycin therapeutic range is dependent on dosage and time of specimen collection. A peak range is 20.0-40.0 ug/mL A trough range is 5.0-15.0 ug/mL        Performed at [facility], [address] [place], [place] [address]   CULTURE, BLOOD (ROUTINE X 2) w Reflex to ID Panel     Status: None (Preliminary result)   Collection Time: 09/25/21  6:12 AM   Specimen: BLOOD  Result Value Ref Range   Specimen Description BLOOD LEFT ARM    Special Requests      BOTTLES DRAWN AEROBIC AND ANAEROBIC Blood Culture adequate volume   Culture      NO GROWTH < 24 HOURS Performed at [facility], [address], [place], [place] [address]    Report Status PENDING   CULTURE, BLOOD (ROUTINE X 2) w Reflex to ID Panel     Status: None (Preliminary result)   Collection Time: 09/25/21  6:12 AM   Specimen: BLOOD  Result Value Ref Range   Specimen Description BLOOD RIGHT HAND    Special Requests      BOTTLES DRAWN AEROBIC AND ANAEROBIC Blood Culture adequate volume   Culture      NO GROWTH < 24 HOURS Performed at [facility], [address] [place], [place] [address]    Report Status PENDING   Glucose, capillary     Status: Abnormal   Collection Time: 09/25/21  9:17 AM  Result Value Ref Range   Glucose-Capillary 131 (H) 70 - 99 mg/dL    Comment: Glucose reference range applies only to samples taken after fasting for at least 8 hours.  Glucose, capillary     Status: Abnormal   Collection Time: 09/25/21  1:03 PM  Result Value Ref Range   Glucose-Capillary 159 (H) 70 - 99 mg/dL    Comment: Glucose reference range applies only to samples taken after fasting for at least 8 hours.  Glucose, capillary     Status: Abnormal   Collection Time: 09/25/21  4:05 PM  Result Value Ref Range   Glucose-Capillary 150  (H) 70 - 99 mg/dL    Comment: Glucose reference range applies only to samples taken after fasting for at least 8 hours.  Glucose, capillary     Status: Abnormal   Collection Time: 09/25/21  9:20 PM  Result Value Ref Range   Glucose-Capillary 126 (H) 70 - 99 mg/dL    Comment: Glucose reference range applies only to samples taken after fasting for at least 8 hours.  CBC     Status: Abnormal   Collection Time: 09/26/21  4:55 AM  Result Value Ref Range   WBC 18.1 (H) 4.0 - 10.5 K/uL   RBC 2.81 (L) 4.22 - 5.81 MIL/uL   Hemoglobin 7.9 (L) 13.0 - 17.0 g/dL   HCT 23.7 (L) 39.0 - 52.0 %   MCV 84.3 80.0 - 100.0 fL   MCH 28.1 26.0 - 34.0 pg   MCHC 33.3 30.0 - 36.0 g/dL   RDW 13.7 11.5 - 15.5 %   Platelets 309 150 - 400 K/uL   nRBC 0.0 0.0 - 0.2 %    Comment: Performed at [facility], [address], [place], [place] [address]  Basic metabolic panel     Status: Abnormal   Collection Time: 09/26/21  4:55 AM  Result Value Ref Range   Sodium 134 (L) 135 - 145 mmol/L   Potassium 3.6 3.5 - 5.1 mmol/L   Chloride 95 (L)  98 - 111 mmol/L   CO2 24 22 - 32 mmol/L   Glucose, Bld 224 (H) 70 - 99 mg/dL    Comment: Glucose reference range applies only to samples taken after fasting for at least 8 hours.   BUN 89 (H) 6 - 20 mg/dL   Creatinine, Ser 18.71 (H) 0.61 - 1.24 mg/dL   Calcium 8.7 (L) 8.9 - 10.3 mg/dL   GFR, Estimated 3 (L) >60 mL/min    Comment: (NOTE) Calculated using the CKD-EPI Creatinine Equation (2021)    Anion gap 15 5 - 15    Comment: Performed at [facility], [address] [place], [place] [address]  Glucose, capillary     Status: Abnormal   Collection Time: 09/26/21  8:59 AM  Result Value Ref Range   Glucose-Capillary 181 (H) 70 - 99 mg/dL    Comment: Glucose reference range applies only to samples taken after fasting for at least 8 hours.  Glucose, capillary     Status: Abnormal   Collection Time: 09/26/21 12:14 PM  Result Value Ref Range    Glucose-Capillary 125 (H) 70 - 99 mg/dL    Comment: Glucose reference range applies only to samples taken after fasting for at least 8 hours.  Glucose, capillary     Status: Abnormal   Collection Time: 09/26/21  4:27 PM  Result Value Ref Range   Glucose-Capillary 111 (H) 70 - 99 mg/dL    Comment: Glucose reference range applies only to samples taken after fasting for at least 8 hours.  Glucose, capillary     Status: Abnormal   Collection Time: 09/26/21  5:24 PM  Result Value Ref Range   Glucose-Capillary 122 (H) 70 - 99 mg/dL    Comment: Glucose reference range applies only to samples taken after fasting for at least 8 hours.    MR FOOT LEFT WO CONTRAST  Result Date: 09/26/2021 CLINICAL DATA:  Osteomyelitis, foot left foot abscess, MRSA bacteremia EXAM: MRI OF THE LEFT FOOT WITHOUT CONTRAST TECHNIQUE: Multiplanar, multisequence MR imaging of the left foot was performed. No intravenous contrast was administered. COMPARISON:  X-ray 09/22/2021 FINDINGS: Bones/Joint/Cartilage Extensive bone marrow edema with low T1 signal changes involving the proximal and distal phalanx of the great toe. There is also involvement of the first metatarsal head and distal diaphysis. Serpiginous areas of T2 hyperintense signal within the first metatarsal diaphysis likely indicative of underlying bone infarct. Large complex first MTP joint effusion suggesting septic arthritis. Status post fifth ray resection at the level of the fifth metatarsal base. Preserved signal within the residual fifth metatarsal base. Remaining osseous structures of the foot are within normal limits. No additional sites of bone marrow edema. Ligaments Intact Lisfranc ligament. No evidence of collateral ligament disruption. Muscles and Tendons Chronic denervation changes of the intrinsic foot musculature with probable superimposed myositis. Amputation changes at the lateral forefoot. Mild tenosynovitis involving the flexor and extensor tendons of the  great toe distally. Soft tissues Soft tissue ulceration along the medial aspect of the great toe at the level of the first metatarsal head. Underlying contiguous fluid collection measures approximately 5.6 x 4.2 x 4.0 cm, which appears to be contiguous with the first MTP joint space. Diffuse soft tissue edema. IMPRESSION: 1. Soft tissue ulceration at the level of the first metatarsal head. Findings of acute osteomyelitis involving the proximal and distal phalanx of the great toe as well as the first metatarsal head and distal diaphysis. 2. Large complex first MTP joint effusion compatible with  septic arthritis. 3. Soft tissue abscess adjacent to the first metatarsal head measuring up to 5.6 cm. 4. Mild tenosynovitis involving the flexor and extensor tendons of the great toe distally. 5. Status post fifth ray resection at the level of the fifth metatarsal base. Preserved signal within the residual fifth metatarsal base. Electronically Signed   By: Davina Poke D.O.   On: 09/26/2021 16:02   US Venous Img Lower Unilateral Left (DVT)  Result Date: 09/25/2021 CLINICAL DATA:  Left lower extremity pain for the past 3 weeks. Evaluate for DVT. EXAM: LEFT LOWER EXTREMITY VENOUS DOPPLER ULTRASOUND TECHNIQUE: Gray-scale sonography with graded compression, as well as color Doppler and duplex ultrasound were performed to evaluate the lower extremity deep venous systems from the level of the common femoral vein and including the common femoral, femoral, profunda femoral, popliteal and calf veins including the posterior tibial, peroneal and gastrocnemius veins when visible. The superficial great saphenous vein was also interrogated. Spectral Doppler was utilized to evaluate flow at rest and with distal augmentation maneuvers in the common femoral, femoral and popliteal veins. COMPARISON:  CT the chest, abdomen and pelvis-09/22/2021 FINDINGS: Contralateral Common Femoral Vein: Respiratory phasicity is normal and symmetric  with the symptomatic side. No evidence of thrombus. Normal compressibility. Common Femoral Vein: No evidence of thrombus. Normal compressibility, respiratory phasicity and response to augmentation. Saphenofemoral Junction: No evidence of thrombus. Normal compressibility and flow on color Doppler imaging. Profunda Femoral Vein: No evidence of thrombus. Normal compressibility and flow on color Doppler imaging. Femoral Vein: No evidence of thrombus. Normal compressibility, respiratory phasicity and response to augmentation. Popliteal Vein: No evidence of thrombus. Normal compressibility, respiratory phasicity and response to augmentation. Calf Veins: No evidence of thrombus. Normal compressibility and flow on color Doppler imaging. Superficial Great Saphenous Vein: No evidence of thrombus. Normal compressibility. Venous Reflux:  None. Other Findings: Note is made of mildly prominent though non pathologically enlarged left inguinal lymph nodes with index left inguinal lymph node measuring 1.1 cm in greatest short axis diameter (image 14). Borderline enlarged right inguinal lymph node measures 1.5 cm in greatest short axis diameter with partial thickening on its cortex though maintenance of a benign fatty hilum (image 40). IMPRESSION: 1. No evidence of DVT within the left lower extremity. 2. Prominent bilateral inguinal lymph nodes, right greater than left, similar to recent abdominal CT, nonspecific though presumably reactive in etiology in the setting peritoneal dialysis. Clinical correlation is advised. Electronically Signed   By: Sandi Mariscal M.D.   On: 09/25/2021 12:44    Review of Systems  All other systems reviewed and are negative. Blood pressure 135/62, pulse 85, temperature 98.9 F (37.2 C), resp. rate 12, height 6' 6"  (1.981 m), weight 104.3 kg, SpO2 100 %. Physical Exam Vitals and nursing note reviewed.  Constitutional:      Appearance: Normal appearance. He is normal weight.  HENT:     Head:  Normocephalic and atraumatic.     Nose: Nose normal.  Eyes:     Extraocular Movements: Extraocular movements intact.     Pupils: Pupils are equal, round, and reactive to light.  Cardiovascular:     Rate and Rhythm: Normal rate and regular rhythm.     Pulses:          Dorsalis pedis pulses are 1+ on the right side and 0 on the left side.  Pulmonary:     Effort: Pulmonary effort is normal.     Breath sounds: Normal breath sounds.  Abdominal:  General: Abdomen is flat. Bowel sounds are normal.     Palpations: Abdomen is soft.  Musculoskeletal:        General: Normal range of motion.     Cervical back: Normal range of motion and neck supple.       Feet:  Feet:     Right foot:     Skin integrity: Skin integrity normal.     Left foot:     Skin integrity: Ulcer and warmth present.  Skin:    General: Skin is warm and dry.  Neurological:     General: No focal deficit present.     Mental Status: He is alert and oriented to person, place, and time.  Psychiatric:        Mood and Affect: Mood normal.        Behavior: Behavior normal.        Thought Content: Thought content normal.        Judgment: Judgment normal.    Assessment/Plan: Left foot diabetic foot infection with gangrene He will need I&D and aortogram with runoff of the left lower limb for further evaluation and possible endovascular therapy. Discussed plan with Dr. Benita Stabile 09/26/2021, 5:43 PM

## 2021-09-26 NOTE — Progress Notes (Signed)
[facility]  ROUNDING NOTE   Subjective:   Peritoneal dialysis treatment last night. Tolerated treatment well.   Tmax 100.4  Patient with a left anterior foot abscess with purulent drainage.   Mother at bedside.   Objective:  Vital signs in last 24 hours:  Temp:  [98.3 F (36.8 C)-100.5 F (38.1 C)] 100.5 F (38.1 C) (11/19 0838) Pulse Rate:  [77-89] 82 (11/19 0838) Resp:  [14-18] 14 (11/19 0838) BP: (112-141)/(60-70) 141/70 (11/19 0838) SpO2:  [93 %-100 %] 100 % (11/19 0838)  Weight change:  Filed Weights   09/22/21 1123 09/24/21 1333  Weight: 104.3 kg 104.3 kg    Intake/Output: I/O last 3 completed shifts: In: 200 [P.O.:200] Out: 0    Intake/Output this shift:  No intake/output data recorded.  Physical Exam: General: NAD,  laying in bed  Head: Normocephalic, atraumatic. Moist oral mucosal membranes  Eyes: Anicteric, PERRL  Neck: Supple, trachea midline  Lungs:  Clear to auscultation  Heart: Regular rate and rhythm  Abdomen:  Soft, nontender, PD catheter  Extremities: No peripheral edema. Right foot dressings  Neurologic: Nonfocal, moving all four extremities  Skin: No lesions  Access: PD catheter, right radiocephalic AVF    Basic Metabolic Panel: Recent Labs  Lab 09/22/21 1332 09/23/21 0445 09/24/21 0552 09/26/21 0455  NA 132* 134* 133* 134*  K 4.4 4.5 3.4* 3.6  CL 91* 92* 94* 95*  CO2 20* 22 22 24   GLUCOSE 122* 147* 215* 224*  BUN 113* 106* 97* 89*  CREATININE 23.91* 24.45* 20.61* 18.71*  CALCIUM 9.3 9.3 8.7* 8.7*     Liver Function Tests: Recent Labs  Lab 09/22/21 1332 09/23/21 0445  AST 12* 14*  ALT 15 14  ALKPHOS 73 73  BILITOT 1.0 0.7  PROT 8.5* 7.9  ALBUMIN 2.6* 2.4*    No results for input(s): LIPASE, AMYLASE in the last 168 hours. No results for input(s): AMMONIA in the last 168 hours.  CBC: Recent Labs  Lab 09/22/21 1332 09/23/21 0445 09/23/21 0643 09/24/21 0552 09/26/21 0455  WBC 25.9* 29.9* 25.7*  21.4* 18.1*  NEUTROABS 20.7*  --  21.7*  --   --   HGB 9.2* 8.3* 9.1* 8.5* 7.9*  HCT 26.2* 23.9* 25.6* 24.4* 23.7*  MCV 84.8 84.5 82 82.2 84.3  PLT 303 334 330 297 309     Cardiac Enzymes: No results for input(s): CKTOTAL, CKMB, CKMBINDEX, TROPONINI in the last 168 hours.  BNP: Invalid input(s): POCBNP  CBG: Recent Labs  Lab 09/25/21 0917 09/25/21 1303 09/25/21 1605 09/25/21 2120 09/26/21 0859  GLUCAP 131* 159* 150* 126* 181*     Microbiology: Results for orders placed or performed during the hospital encounter of 09/22/21  Resp Panel by RT-PCR (Flu A&B, Covid) Nasopharyngeal Swab     Status: None   Collection Time: 09/22/21 11:30 AM   Specimen: Nasopharyngeal Swab; Nasopharyngeal(NP) swabs in vial transport medium  Result Value Ref Range Status   SARS Coronavirus 2 by RT PCR NEGATIVE NEGATIVE Final    Comment: (NOTE) SARS-CoV-2 target nucleic acids are NOT DETECTED.  The SARS-CoV-2 RNA is generally detectable in upper respiratory specimens during the acute phase of infection. The lowest concentration of SARS-CoV-2 viral copies this assay can detect is 138 copies/mL. A negative result does not preclude SARS-Cov-2 infection and should not be used as the sole basis for treatment or other patient management decisions. A negative result may occur with  improper specimen collection/handling, submission of specimen other than nasopharyngeal swab, presence  of viral mutation(s) within the areas targeted by this assay, and inadequate number of viral copies(<138 copies/mL). A negative result must be combined with clinical observations, patient history, and epidemiological information. The expected result is Negative.  Fact Sheet for Patients:  EntrepreneurPulse.com.au  Fact Sheet for Healthcare Providers:  IncredibleEmployment.be  This test is no t yet approved or cleared by the [place] FDA and  has been authorized for detection  and/or diagnosis of SARS-CoV-2 by FDA under an Emergency Use Authorization (EUA). This EUA will remain  in effect (meaning this test can be used) for the duration of the COVID-19 declaration under Section 564(b)(1) of the Act, 21 U.S.C.section 360bbb-3(b)(1), unless the authorization is terminated  or revoked sooner.       Influenza A by PCR NEGATIVE NEGATIVE Final   Influenza B by PCR NEGATIVE NEGATIVE Final    Comment: (NOTE) The Xpert Xpress SARS-CoV-2/FLU/RSV plus assay is intended as an aid in the diagnosis of influenza from Nasopharyngeal swab specimens and should not be used as a sole basis for treatment. Nasal washings and aspirates are unacceptable for Xpert Xpress SARS-CoV-2/FLU/RSV testing.  Fact Sheet for Patients: EntrepreneurPulse.com.au  Fact Sheet for Healthcare Providers: IncredibleEmployment.be  This test is not yet approved or cleared by the [place] FDA and has been authorized for detection and/or diagnosis of SARS-CoV-2 by FDA under an Emergency Use Authorization (EUA). This EUA will remain in effect (meaning this test can be used) for the duration of the COVID-19 declaration under Section 564(b)(1) of the Act, 21 U.S.C. section 360bbb-3(b)(1), unless the authorization is terminated or revoked.  Performed at [facility], [place], [place] [address]   Respiratory (~20 pathogens) panel by PCR     Status: None   Collection Time: 09/22/21 11:30 AM   Specimen: Peritoneal Washings; Respiratory  Result Value Ref Range Status   Adenovirus NOT DETECTED NOT DETECTED Final   Coronavirus 229E NOT DETECTED NOT DETECTED Final    Comment: (NOTE) The Coronavirus on the Respiratory Panel, DOES NOT test for the novel  Coronavirus (2019 nCoV)    Coronavirus HKU1 NOT DETECTED NOT DETECTED Final   Coronavirus NL63 NOT DETECTED NOT DETECTED Final   Coronavirus OC43 NOT DETECTED NOT DETECTED Final    Metapneumovirus NOT DETECTED NOT DETECTED Final   Rhinovirus / Enterovirus NOT DETECTED NOT DETECTED Final   Influenza A NOT DETECTED NOT DETECTED Final   Influenza B NOT DETECTED NOT DETECTED Final   Parainfluenza Virus 1 NOT DETECTED NOT DETECTED Final   Parainfluenza Virus 2 NOT DETECTED NOT DETECTED Final   Parainfluenza Virus 3 NOT DETECTED NOT DETECTED Final   Parainfluenza Virus 4 NOT DETECTED NOT DETECTED Final   Respiratory Syncytial Virus NOT DETECTED NOT DETECTED Final   Bordetella pertussis NOT DETECTED NOT DETECTED Final   Bordetella Parapertussis NOT DETECTED NOT DETECTED Final   Chlamydophila pneumoniae NOT DETECTED NOT DETECTED Final   Mycoplasma pneumoniae NOT DETECTED NOT DETECTED Final    Comment: Performed at [facility], [place]. [address] [place], [place] [address]  Blood Culture (routine x 2)     Status: Abnormal (Preliminary result)   Collection Time: 09/22/21  1:32 PM   Specimen: BLOOD  Result Value Ref Range Status   Specimen Description   Final    BLOOD LEFT ANTECUBITAL Performed at [facility], [address] [place], [place] [address]    Special Requests   Final    BOTTLES DRAWN AEROBIC AND ANAEROBIC Blood  Culture results may not be optimal due to an excessive volume of blood received in culture bottles Performed at [facility], [address] [place], [place] [address]    Culture  Setup Time   Final    GRAM POSITIVE COCCI ANAEROBIC BOTTLE ONLY CRITICAL VALUE NOTED.  VALUE IS CONSISTENT WITH PREVIOUSLY REPORTED AND CALLED VALUE.    Culture (A)  Final    STAPHYLOCOCCUS AUREUS SUSCEPTIBILITIES PERFORMED ON PREVIOUS CULTURE WITHIN THE LAST 5 DAYS. Performed at [facility], [place] [address] [place], [place] [address]    Report Status PENDING  Incomplete  Blood Culture (routine x 2)     Status: Abnormal   Collection Time: 09/22/21  1:32 PM   Specimen: BLOOD  Result Value Ref Range Status   Specimen Description   Final     BLOOD LEFT ANTECUBITAL Performed at [facility], [address] [place], [place] [address]    Special Requests   Final    BOTTLES DRAWN AEROBIC AND ANAEROBIC Blood Culture adequate volume Performed at [facility], [address] [place], [place] [address]    Culture  Setup Time   Final    GRAM POSITIVE COCCI IN BOTH AEROBIC AND ANAEROBIC BOTTLES Organism ID to follow CRITICAL RESULT CALLED TO, READ BACK BY AND VERIFIED WITH: Ardeen Garland, PHARMD AT 0725 ON 09/23/21 BY GM Performed at [facility], [address] [place], [place] [address]    Culture METHICILLIN RESISTANT STAPHYLOCOCCUS AUREUS (A)  Final   Report Status 09/25/2021 FINAL  Final   Organism ID, Bacteria METHICILLIN RESISTANT STAPHYLOCOCCUS AUREUS  Final      Susceptibility   Methicillin resistant staphylococcus aureus - MIC*    CIPROFLOXACIN 2 INTERMEDIATE Intermediate     ERYTHROMYCIN >=8 RESISTANT Resistant     GENTAMICIN <=0.5 SENSITIVE Sensitive     OXACILLIN >=4 RESISTANT Resistant     TETRACYCLINE <=1 SENSITIVE Sensitive     VANCOMYCIN 1 SENSITIVE Sensitive     TRIMETH/SULFA <=10 SENSITIVE Sensitive     CLINDAMYCIN <=0.25 SENSITIVE Sensitive     RIFAMPIN <=0.5 SENSITIVE Sensitive     Inducible Clindamycin NEGATIVE Sensitive     * METHICILLIN RESISTANT STAPHYLOCOCCUS AUREUS  Blood Culture ID Panel (Reflexed)     Status: Abnormal   Collection Time: 09/22/21  1:32 PM  Result Value Ref Range Status   Enterococcus faecalis NOT DETECTED NOT DETECTED Final   Enterococcus Faecium NOT DETECTED NOT DETECTED Final   Listeria monocytogenes NOT DETECTED NOT DETECTED Final   Staphylococcus species DETECTED (A) NOT DETECTED Final    Comment: CRITICAL RESULT CALLED TO, READ BACK BY AND VERIFIED WITH: MORGAN HICKS, PHARMD AT 0725 ON 09/23/21 BY GM    Staphylococcus aureus (BCID) DETECTED (A) NOT DETECTED Final    Comment: Methicillin (oxacillin)-resistant Staphylococcus aureus (MRSA).  MRSA is predictably resistant to beta-lactam antibiotics (except ceftaroline). Preferred therapy is vancomycin unless clinically contraindicated. Patient requires contact precautions if  hospitalized. CRITICAL RESULT CALLED TO, READ BACK BY AND VERIFIED WITH: MORGAN HICKS, PHARMD AT 0725 ON 09/23/21 BY GM    Staphylococcus epidermidis NOT DETECTED NOT DETECTED Final   Staphylococcus lugdunensis NOT DETECTED NOT DETECTED Final   Streptococcus species NOT DETECTED NOT DETECTED Final   Streptococcus agalactiae NOT DETECTED NOT DETECTED Final   Streptococcus pneumoniae NOT DETECTED NOT DETECTED Final   Streptococcus pyogenes NOT DETECTED NOT DETECTED Final   A.calcoaceticus-baumannii NOT DETECTED NOT DETECTED Final   Bacteroides fragilis NOT DETECTED NOT DETECTED  Final   Enterobacterales NOT DETECTED NOT DETECTED Final   Enterobacter cloacae complex NOT DETECTED NOT DETECTED Final   Escherichia coli NOT DETECTED NOT DETECTED Final   Klebsiella aerogenes NOT DETECTED NOT DETECTED Final   Klebsiella oxytoca NOT DETECTED NOT DETECTED Final   Klebsiella pneumoniae NOT DETECTED NOT DETECTED Final   Proteus species NOT DETECTED NOT DETECTED Final   Salmonella species NOT DETECTED NOT DETECTED Final   Serratia marcescens NOT DETECTED NOT DETECTED Final   Haemophilus influenzae NOT DETECTED NOT DETECTED Final   Neisseria meningitidis NOT DETECTED NOT DETECTED Final   Pseudomonas aeruginosa NOT DETECTED NOT DETECTED Final   Stenotrophomonas maltophilia NOT DETECTED NOT DETECTED Final   Candida albicans NOT DETECTED NOT DETECTED Final   Candida auris NOT DETECTED NOT DETECTED Final   Candida glabrata NOT DETECTED NOT DETECTED Final   Candida krusei NOT DETECTED NOT DETECTED Final   Candida parapsilosis NOT DETECTED NOT DETECTED Final   Candida tropicalis NOT DETECTED NOT DETECTED Final   Cryptococcus neoformans/gattii NOT DETECTED NOT DETECTED Final   Meth resistant mecA/C and MREJ DETECTED (A)  NOT DETECTED Final    Comment: CRITICAL RESULT CALLED TO, READ BACK BY AND VERIFIED WITH: Ardeen Garland, PHARMD AT 0725 ON 09/23/21 BY GM Performed at [facility], [address] [place], [place] [address]   Group A Strep by PCR [facility] Only)     Status: None   Collection Time: 09/22/21  1:40 PM   Specimen: Throat; Sterile Swab  Result Value Ref Range Status   Group A Strep by PCR NOT DETECTED NOT DETECTED Final    Comment: Performed at [facility], [address] [place], [place] [address]  Peritoneal fluid culture w Gram Stain     Status: None   Collection Time: 09/22/21  7:29 PM   Specimen: Peritoneal Washings; Peritoneal Fluid  Result Value Ref Range Status   Specimen Description   Final    PERITONEAL Performed at [facility], [address] [place], [place] [address]    Special Requests   Final    NONE Performed at [facility], [address] [place], [place] [address]    Gram Stain   Final    FEW WBC PRESENT, PREDOMINANTLY MONONUCLEAR NO ORGANISMS SEEN    Culture   Final    NO GROWTH 3 DAYS Performed at [facility], [place] [address] [place], [place] [address]    Report Status 09/26/2021 FINAL  Final  Gastrointestinal Panel by PCR , Stool     Status: None   Collection Time: 09/23/21  4:45 AM   Specimen: Stool  Result Value Ref Range Status   Campylobacter species NOT DETECTED NOT DETECTED Final   Plesimonas shigelloides NOT DETECTED NOT DETECTED Final   Salmonella species NOT DETECTED NOT DETECTED Final   Yersinia enterocolitica NOT DETECTED NOT DETECTED Final   Vibrio species NOT DETECTED NOT DETECTED Final   Vibrio cholerae NOT DETECTED NOT DETECTED Final   Enteroaggregative E coli (EAEC) NOT DETECTED NOT DETECTED Final   Enteropathogenic E coli (EPEC) NOT DETECTED NOT DETECTED Final   Enterotoxigenic E coli (ETEC) NOT DETECTED NOT DETECTED Final   Shiga like toxin producing E coli (STEC) NOT DETECTED NOT DETECTED  Final   Shigella/Enteroinvasive E coli (EIEC) NOT DETECTED NOT DETECTED Final   Cryptosporidium NOT DETECTED NOT DETECTED Final   Cyclospora cayetanensis NOT DETECTED NOT DETECTED Final   Entamoeba histolytica NOT DETECTED NOT DETECTED Final  Giardia lamblia NOT DETECTED NOT DETECTED Final   Adenovirus F40/41 NOT DETECTED NOT DETECTED Final   Astrovirus NOT DETECTED NOT DETECTED Final   Norovirus GI/GII NOT DETECTED NOT DETECTED Final   Rotavirus A NOT DETECTED NOT DETECTED Final   Sapovirus (I, II, IV, and V) NOT DETECTED NOT DETECTED Final    Comment: Performed at [facility], [address], [place], [place] [address]  C Difficile Quick Screen w PCR reflex     Status: None   Collection Time: 09/23/21  4:45 AM   Specimen: Stool  Result Value Ref Range Status   C Diff antigen NEGATIVE NEGATIVE Final   C Diff toxin NEGATIVE NEGATIVE Final   C Diff interpretation No C. difficile detected.  Final    Comment: Performed at [facility], [address] [place], [place] [address]  CULTURE, BLOOD (ROUTINE X 2) w Reflex to ID Panel     Status: None (Preliminary result)   Collection Time: 09/25/21  6:12 AM   Specimen: BLOOD  Result Value Ref Range Status   Specimen Description BLOOD LEFT ARM  Final   Special Requests   Final    BOTTLES DRAWN AEROBIC AND ANAEROBIC Blood Culture adequate volume   Culture   Final    NO GROWTH < 24 HOURS Performed at [facility], [address], [place], [place] [address]    Report Status PENDING  Incomplete  CULTURE, BLOOD (ROUTINE X 2) w Reflex to ID Panel     Status: None (Preliminary result)   Collection Time: 09/25/21  6:12 AM   Specimen: BLOOD  Result Value Ref Range Status   Specimen Description BLOOD RIGHT HAND  Final   Special Requests   Final    BOTTLES DRAWN AEROBIC AND ANAEROBIC Blood Culture adequate volume   Culture   Final    NO GROWTH < 24 HOURS Performed at [facility], [address]  [place],  [address]    Report Status PENDING  Incomplete    Coagulation Studies: No results for input(s): LABPROT, INR in the last 72 hours.   Urinalysis: No results for input(s): COLORURINE, LABSPEC, PHURINE, GLUCOSEU, HGBUR, BILIRUBINUR, KETONESUR, PROTEINUR, UROBILINOGEN, NITRITE, LEUKOCYTESUR in the last 72 hours.  Invalid input(s): APPERANCEUR    Imaging: US Venous Img Lower Unilateral Left (DVT)  Result Date: 09/25/2021 CLINICAL DATA:  Left lower extremity pain for the past 3 weeks. Evaluate for DVT. EXAM: LEFT LOWER EXTREMITY VENOUS DOPPLER ULTRASOUND TECHNIQUE: Gray-scale sonography with graded compression, as well as color Doppler and duplex ultrasound were performed to evaluate the lower extremity deep venous systems from the level of the common femoral vein and including the common femoral, femoral, profunda femoral, popliteal and calf veins including the posterior tibial, peroneal and gastrocnemius veins when visible. The superficial great saphenous vein was also interrogated. Spectral Doppler was utilized to evaluate flow at rest and with distal augmentation maneuvers in the common femoral, femoral and popliteal veins. COMPARISON:  CT the chest, abdomen and pelvis-09/22/2021 FINDINGS: Contralateral Common Femoral Vein: Respiratory phasicity is normal and symmetric with the symptomatic side. No evidence of thrombus. Normal compressibility. Common Femoral Vein: No evidence of thrombus. Normal compressibility, respiratory phasicity and response to augmentation. Saphenofemoral Junction: No evidence of thrombus. Normal compressibility and flow on color Doppler imaging. Profunda Femoral Vein: No evidence of thrombus. Normal compressibility and flow on color Doppler imaging. Femoral Vein: No evidence of thrombus. Normal compressibility, respiratory phasicity and response to augmentation. Popliteal Vein: No evidence  of thrombus. Normal compressibility, respiratory phasicity and response to  augmentation. Calf Veins: No evidence of thrombus. Normal compressibility and flow on color Doppler imaging. Superficial Great Saphenous Vein: No evidence of thrombus. Normal compressibility. Venous Reflux:  None. Other Findings: Note is made of mildly prominent though non pathologically enlarged left inguinal lymph nodes with index left inguinal lymph node measuring 1.1 cm in greatest short axis diameter (image 14). Borderline enlarged right inguinal lymph node measures 1.5 cm in greatest short axis diameter with partial thickening on its cortex though maintenance of a benign fatty hilum (image 40). IMPRESSION: 1. No evidence of DVT within the left lower extremity. 2. Prominent bilateral inguinal lymph nodes, right greater than left, similar to recent abdominal CT, nonspecific though presumably reactive in etiology in the setting peritoneal dialysis. Clinical correlation is advised. Electronically Signed   By: Sandi Mariscal M.D.   On: 09/25/2021 12:44   ECHO TEE  Result Date: 09/24/2021    TRANSESOPHOGEAL ECHO REPORT   Patient Name:   Nathan Ayers Date of Exam: 09/24/2021 Medical Rec #:  [id number]      Height:       78.0 in Accession #:    [id number]     Weight:       230.0 lb Date of Birth:  01-31-1971      BSA:          2.396 m Patient Age:    50 years       BP:           100/42 mmHg Patient Gender: M              HR:           81 bpm. Exam Location:  [facility] Procedure: Transesophageal Echo, Cardiac Doppler and Color Doppler Indications:     MRSA bacteremia, endocarditis, aorta atheroma  History:         Patient has prior history of Echocardiogram examinations, most                  recent 09/22/2021. Risk Factors:Hypertension and Diabetes. CKD.  Sonographer:     Sherrie Sport Referring Phys:  [place] Diagnosing Phys: Ida Rogue MD PROCEDURE: After discussion of the risks and benefits of a TEE, an informed consent was obtained from the patient. TEE procedure time was 30 minutes. The  transesophogeal probe was passed without difficulty through the esophogus of the patient. Imaged were obtained with the patient in a left lateral decubitus position. Local oropharyngeal anesthetic was provided with Cetacaine and viscous lidocaine. Sedation performed by performing physician. Patients was under conscious sedation during this procedure. Anesthetic administered: 120mg of Fentanyl, 4.036mof Versed. Image quality was excellent. The patient's vital signs; including heart rate, blood pressure, and oxygen saturation; remained stable throughout the procedure. The patient developed no complications during the procedure. IMPRESSIONS  1. No valve endocarditis  2. Left ventricular ejection fraction, by estimation, is 60 to 65%. The left ventricle has normal function. The left ventricle has no regional wall motion abnormalities.  3. Right ventricular systolic function is normal. The right ventricular size is normal.  4. No left atrial/left atrial appendage thrombus was detected.  5. The mitral valve is normal in structure. No evidence of mitral valve regurgitation. No evidence of mitral stenosis.  6. The aortic valve is normal in structure. Aortic valve regurgitation is trivial. Aortic valve sclerosis is present, with no evidence of aortic valve stenosis.  7. There is mild (  Grade II) atheroma plaque involving the descending aorta.  8. The inferior vena cava is normal in size with greater than 50% respiratory variability, suggesting right atrial pressure of 3 mmHg.  9. Agitated saline contrast bubble study was negative, with no evidence of any interatrial shunt. Conclusion(s)/Recommendation(s): Normal biventricular function without evidence of hemodynamically significant valvular heart disease. FINDINGS  Left Ventricle: Left ventricular ejection fraction, by estimation, is 60 to 65%. The left ventricle has normal function. The left ventricle has no regional wall motion abnormalities. The left ventricular internal  cavity size was normal in size. There is  no left ventricular hypertrophy. Right Ventricle: The right ventricular size is normal. No increase in right ventricular wall thickness. Right ventricular systolic function is normal. Left Atrium: Left atrial size was normal in size. No left atrial/left atrial appendage thrombus was detected. Right Atrium: Right atrial size was normal in size. Pericardium: There is no evidence of pericardial effusion. Mitral Valve: The mitral valve is normal in structure. No evidence of mitral valve regurgitation. No evidence of mitral valve stenosis. Tricuspid Valve: The tricuspid valve is normal in structure. Tricuspid valve regurgitation is mild . No evidence of tricuspid stenosis. Aortic Valve: The aortic valve is normal in structure. Aortic valve regurgitation is trivial. Aortic valve sclerosis is present, with no evidence of aortic valve stenosis. Pulmonic Valve: The pulmonic valve was normal in structure. Pulmonic valve regurgitation is not visualized. No evidence of pulmonic stenosis. Aorta: The aortic root is normal in size and structure. There is mild (Grade II) atheroma plaque involving the descending aorta. Venous: The inferior vena cava is normal in size with greater than 50% respiratory variability, suggesting right atrial pressure of 3 mmHg. IAS/Shunts: No atrial level shunt detected by color flow Doppler. Agitated saline contrast was given intravenously to evaluate for intracardiac shunting. Agitated saline contrast bubble study was negative, with no evidence of any interatrial shunt. There  is no evidence of a patent foramen ovale. There is no evidence of an atrial septal defect. Ida Rogue MD Electronically signed by Ida Rogue MD Signature Date/Time: 09/24/2021/6:29:20 PM    Final (Updated)      Medications:    sodium chloride     dialysis solution 1.5% low-MG/low-CA     dialysis solution 2.5% low-MG/low-CA      abacavir  300 mg Oral BID   carvedilol   12.5 mg Oral BID   dolutegravir  50 mg Oral QHS   feeding supplement (NEPRO CARB STEADY)  237 mL Oral TID BM   gentamicin cream  1 application Topical Daily   heparin  5,000 Units Subcutaneous Q8H   insulin aspart  0-6 Units Subcutaneous TID WC   lamiVUDine  25 mg Oral QHS   multivitamin  1 tablet Oral Daily   potassium chloride  20 mEq Oral Daily   sodium chloride flush  3 mL Intravenous Q12H   vancomycin variable dose per unstable renal function (pharmacist dosing)   Does not apply See admin instructions   sodium chloride, acetaminophen, sodium chloride flush  Assessment/ Plan:  Nathan Ayers is a 50 y.o. black male with end stage renal disease on peritoneal dialysis, HIV on HART, hypertension, diabetes mellitus type II, diabetic neuropathy who is admitted to [facility] on 09/22/2021 on Leukocytosis [D72.829] Uremia [N19] Generalized abdominal pain [R10.84] ESRD on peritoneal dialysis ([place]) [N18.6, Z99.2] Acute sepsis ([place]) [A41.9]  [facility] 102.5kg CCPD 9 hours 5 exchanges 2.7 liter fills   End stage renal disease: no  indication that patient has peritonitis.  - Continue peritoneal dialysis. Orders prepared for tonight.   Hypertension:   - Continue minoxidil, amlodipine, and carvedilol  Anemia with chronic kidney disease: hemoglobin 7.9. normocytic. Mircera as outpatient.   Diabetes mellitus type II with chronic kidney disease: continue glucose control  Sepsis with bacteremia: MRSA in blood cultures. Source seems to be left foot lesion - appreciate podiatry, ID and cardiology input.     LOS: 2 Edra Riccardi 11/19/202211:46 AM

## 2021-09-27 ENCOUNTER — Inpatient Hospital Stay: Payer: Medicare Other

## 2021-09-27 ENCOUNTER — Inpatient Hospital Stay: Payer: Medicare Other | Admitting: Anesthesiology

## 2021-09-27 ENCOUNTER — Encounter
Admission: EM | Disposition: A | Discharge status: Home / Self Care | Payer: Self-pay | Source: Home / Self Care | Attending: Internal Medicine

## 2021-09-27 DIAGNOSIS — N2581 Secondary hyperparathyroidism of renal origin: Secondary | ICD-10-CM | POA: Diagnosis not present

## 2021-09-27 DIAGNOSIS — N186 End stage renal disease: Secondary | ICD-10-CM | POA: Diagnosis not present

## 2021-09-27 DIAGNOSIS — E440 Moderate protein-calorie malnutrition: Secondary | ICD-10-CM | POA: Diagnosis not present

## 2021-09-27 DIAGNOSIS — D509 Iron deficiency anemia, unspecified: Secondary | ICD-10-CM | POA: Diagnosis not present

## 2021-09-27 DIAGNOSIS — D631 Anemia in chronic kidney disease: Secondary | ICD-10-CM | POA: Diagnosis not present

## 2021-09-27 DIAGNOSIS — Z992 Dependence on renal dialysis: Secondary | ICD-10-CM | POA: Diagnosis not present

## 2021-09-27 DIAGNOSIS — L089 Local infection of the skin and subcutaneous tissue, unspecified: Secondary | ICD-10-CM

## 2021-09-27 DIAGNOSIS — R17 Unspecified jaundice: Secondary | ICD-10-CM | POA: Diagnosis not present

## 2021-09-27 DIAGNOSIS — N2589 Other disorders resulting from impaired renal tubular function: Secondary | ICD-10-CM | POA: Diagnosis not present

## 2021-09-27 DIAGNOSIS — Z79899 Other long term (current) drug therapy: Secondary | ICD-10-CM | POA: Diagnosis not present

## 2021-09-27 DIAGNOSIS — R7881 Bacteremia: Secondary | ICD-10-CM | POA: Diagnosis not present

## 2021-09-27 HISTORY — PX: AMPUTATION: SHX166

## 2021-09-27 HISTORY — PX: INCISION AND DRAINAGE: SHX5863

## 2021-09-27 LAB — CBC
HCT: 22.9 % — ABNORMAL LOW (ref 39.0–52.0)
Hemoglobin: 7.8 g/dL — ABNORMAL LOW (ref 13.0–17.0)
MCH: 28.4 pg (ref 26.0–34.0)
MCHC: 34.1 g/dL (ref 30.0–36.0)
MCV: 83.3 fL (ref 80.0–100.0)
Platelets: 340 10*3/uL (ref 150–400)
RBC: 2.75 MIL/uL — ABNORMAL LOW (ref 4.22–5.81)
RDW: 13.8 % (ref 11.5–15.5)
WBC: 13.9 10*3/uL — ABNORMAL HIGH (ref 4.0–10.5)
nRBC: 0 % (ref 0.0–0.2)

## 2021-09-27 LAB — GLUCOSE, CAPILLARY
Glucose-Capillary: 110 mg/dL — ABNORMAL HIGH (ref 70–99)
Glucose-Capillary: 112 mg/dL — ABNORMAL HIGH (ref 70–99)
Glucose-Capillary: 140 mg/dL — ABNORMAL HIGH (ref 70–99)
Glucose-Capillary: 173 mg/dL — ABNORMAL HIGH (ref 70–99)

## 2021-09-27 LAB — BASIC METABOLIC PANEL
Anion gap: 16 — ABNORMAL HIGH (ref 5–15)
BUN: 88 mg/dL — ABNORMAL HIGH (ref 6–20)
CO2: 23 mmol/L (ref 22–32)
Calcium: 8.8 mg/dL — ABNORMAL LOW (ref 8.9–10.3)
Chloride: 94 mmol/L — ABNORMAL LOW (ref 98–111)
Creatinine, Ser: 18.37 mg/dL — ABNORMAL HIGH (ref 0.61–1.24)
GFR, Estimated: 3 mL/min — ABNORMAL LOW (ref >60)
Glucose, Bld: 172 mg/dL — ABNORMAL HIGH (ref 70–99)
Potassium: 3.6 mmol/L (ref 3.5–5.1)
Sodium: 133 mmol/L — ABNORMAL LOW (ref 135–145)

## 2021-09-27 LAB — CULTURE, BLOOD (ROUTINE X 2)

## 2021-09-27 LAB — SURGICAL PCR SCREEN
MRSA, PCR: POSITIVE — AB
Staphylococcus aureus: POSITIVE — AB

## 2021-09-27 LAB — VANCOMYCIN, RANDOM: Vancomycin Rm: 20

## 2021-09-27 SURGERY — INCISION AND DRAINAGE
Anesthesia: General | Site: Foot | Laterality: Left

## 2021-09-27 MED ORDER — HEMOSTATIC AGENTS (NO CHARGE) OPTIME
TOPICAL | Status: DC | PRN
  Administered 2021-09-27 (×3): 1 via TOPICAL

## 2021-09-27 MED ORDER — IOHEXOL 350 MG/ML SOLN
100.0000 mL | Freq: Once | INTRAVENOUS | Status: AC | PRN
  Administered 2021-09-27: 100 mL via INTRAVENOUS

## 2021-09-27 MED ORDER — FENTANYL CITRATE (PF) 100 MCG/2ML IJ SOLN
INTRAMUSCULAR | Status: AC
  Filled 2021-09-27: qty 2

## 2021-09-27 MED ORDER — FENTANYL CITRATE (PF) 100 MCG/2ML IJ SOLN: 25.0000 ug | INTRAMUSCULAR | Status: DC | PRN

## 2021-09-27 MED ORDER — MUPIROCIN 2 % EX OINT
1.0000 "application " | TOPICAL_OINTMENT | Freq: Two times a day (BID) | CUTANEOUS | Status: AC
  Administered 2021-09-27 – 2021-10-01 (×9): 1 via NASAL
  Filled 2021-09-27: qty 22

## 2021-09-27 MED ORDER — GLYCOPYRROLATE 0.2 MG/ML IJ SOLN
INTRAMUSCULAR | Status: DC | PRN
  Administered 2021-09-27: .2 mg via INTRAVENOUS

## 2021-09-27 MED ORDER — GLYCOPYRROLATE 0.2 MG/ML IJ SOLN
INTRAMUSCULAR | Status: AC
  Filled 2021-09-27: qty 1

## 2021-09-27 MED ORDER — NEOMYCIN-POLYMYXIN B GU 40-200000 IR SOLN
Status: DC | PRN
  Administered 2021-09-27: 4 mL

## 2021-09-27 MED ORDER — 0.9 % SODIUM CHLORIDE (POUR BTL) OPTIME
TOPICAL | Status: DC | PRN
  Administered 2021-09-27: 1000 mL

## 2021-09-27 MED ORDER — LIDOCAINE HCL (CARDIAC) PF 100 MG/5ML IV SOSY
PREFILLED_SYRINGE | INTRAVENOUS | Status: DC | PRN
  Administered 2021-09-27: 100 mg via INTRAVENOUS

## 2021-09-27 MED ORDER — MIDAZOLAM HCL 2 MG/2ML IJ SOLN
INTRAMUSCULAR | Status: AC
  Filled 2021-09-27: qty 2

## 2021-09-27 MED ORDER — EPHEDRINE SULFATE 50 MG/ML IJ SOLN
INTRAMUSCULAR | Status: DC | PRN
  Administered 2021-09-27: 5 mg via INTRAVENOUS
  Administered 2021-09-27 (×2): 10 mg via INTRAVENOUS

## 2021-09-27 MED ORDER — SODIUM CHLORIDE 0.9 % IV SOLN: INTRAVENOUS | Status: DC | PRN

## 2021-09-27 MED ORDER — PHENYLEPHRINE HCL (PRESSORS) 10 MG/ML IV SOLN
INTRAVENOUS | Status: AC
  Filled 2021-09-27: qty 2

## 2021-09-27 MED ORDER — SODIUM CHLORIDE 0.9 % IR SOLN
Status: DC | PRN
  Administered 2021-09-27: 3000 mL

## 2021-09-27 MED ORDER — ONDANSETRON HCL 4 MG/2ML IJ SOLN
INTRAMUSCULAR | Status: DC | PRN
  Administered 2021-09-27: 4 mg via INTRAVENOUS

## 2021-09-27 MED ORDER — PROPOFOL 500 MG/50ML IV EMUL
INTRAVENOUS | Status: AC
  Filled 2021-09-27: qty 50

## 2021-09-27 MED ORDER — HYDROCODONE-ACETAMINOPHEN 7.5-325 MG PO TABS
1.0000 | ORAL_TABLET | Freq: Once | ORAL | Status: DC | PRN
  Filled 2021-09-27: qty 1

## 2021-09-27 MED ORDER — PROPOFOL 10 MG/ML IV BOLUS
INTRAVENOUS | Status: DC | PRN
  Administered 2021-09-27: 150 mg via INTRAVENOUS

## 2021-09-27 MED ORDER — FENTANYL CITRATE (PF) 100 MCG/2ML IJ SOLN
INTRAMUSCULAR | Status: DC | PRN
  Administered 2021-09-27: 50 ug via INTRAVENOUS

## 2021-09-27 MED ORDER — VANCOMYCIN HCL 1000 MG IV SOLR
INTRAVENOUS | Status: DC | PRN
  Administered 2021-09-27: 1000 mg

## 2021-09-27 MED ORDER — MIDAZOLAM HCL 2 MG/2ML IJ SOLN
INTRAMUSCULAR | Status: DC | PRN
  Administered 2021-09-27: 2 mg via INTRAVENOUS

## 2021-09-27 MED ORDER — CHLORHEXIDINE GLUCONATE CLOTH 2 % EX PADS
6.0000 | MEDICATED_PAD | Freq: Every day | CUTANEOUS | Status: AC
  Administered 2021-09-28 – 2021-10-02 (×5): 6 via TOPICAL

## 2021-09-27 MED ORDER — PHENYLEPHRINE HCL (PRESSORS) 10 MG/ML IV SOLN
INTRAVENOUS | Status: DC | PRN
Start: 2021-09-27 — End: 2021-09-27
  Administered 2021-09-27 (×4): 240 ug via INTRAVENOUS

## 2021-09-27 MED ORDER — LIDOCAINE HCL (PF) 2 % IJ SOLN
INTRAMUSCULAR | Status: AC
  Filled 2021-09-27: qty 5

## 2021-09-27 MED ORDER — CHLORHEXIDINE GLUCONATE 4 % EX LIQD
60.0000 mL | Freq: Once | CUTANEOUS | Status: AC
  Administered 2021-09-27: 4 via TOPICAL

## 2021-09-27 MED ORDER — EPHEDRINE 5 MG/ML INJ
INTRAVENOUS | Status: AC
  Filled 2021-09-27: qty 5

## 2021-09-27 MED ORDER — ONDANSETRON HCL 4 MG/2ML IJ SOLN
INTRAMUSCULAR | Status: AC
  Filled 2021-09-27: qty 2

## 2021-09-27 MED ORDER — LIDOCAINE HCL (PF) 1 % IJ SOLN
INTRAMUSCULAR | Status: DC | PRN
  Administered 2021-09-27: 20 mL

## 2021-09-27 SURGICAL SUPPLY — 73 items
BAG COUNTER SPONGE SURGICOUNT (BAG) ×2 IMPLANT
BLADE MED AGGRESSIVE (BLADE) ×2 IMPLANT
BLADE OSC/SAGITTAL MD 5.5X18 (BLADE) IMPLANT
BLADE OSCILLATING/SAGITTAL (BLADE)
BLADE SURG 15 STRL LF DISP TIS (BLADE) IMPLANT
BLADE SURG 15 STRL SS (BLADE)
BLADE SURG MINI STRL (BLADE) IMPLANT
BLADE SW THK.38XMED LNG THN (BLADE) IMPLANT
BNDG CONFORM 2 STRL LF (GAUZE/BANDAGES/DRESSINGS) IMPLANT
BNDG CONFORM 3 STRL LF (GAUZE/BANDAGES/DRESSINGS) IMPLANT
BNDG ELASTIC 3X5.8 VLCR NS LF (GAUZE/BANDAGES/DRESSINGS) IMPLANT
BNDG ELASTIC 4X5.8 VLCR NS LF (GAUZE/BANDAGES/DRESSINGS) IMPLANT
BNDG ELASTIC 4X5.8 VLCR STR LF (GAUZE/BANDAGES/DRESSINGS) ×2 IMPLANT
BNDG ESMARK 4X12 TAN STRL LF (GAUZE/BANDAGES/DRESSINGS) ×2 IMPLANT
BNDG GAUZE ELAST 4 BULKY (GAUZE/BANDAGES/DRESSINGS) ×2 IMPLANT
BNDG STRETCH 4X75 STRL LF (GAUZE/BANDAGES/DRESSINGS) ×1 IMPLANT
CNTNR SPEC 2.5X3XGRAD LEK (MISCELLANEOUS) ×1
CONT SPEC 4OZ STER OR WHT (MISCELLANEOUS) ×1
CONTAINER SPEC 2.5X3XGRAD LEK (MISCELLANEOUS) ×1 IMPLANT
CUFF TOURN SGL QUICK 12 (TOURNIQUET CUFF) IMPLANT
CUFF TOURN SGL QUICK 18X4 (TOURNIQUET CUFF) IMPLANT
DRAPE FLUOR MINI C-ARM 54X84 (DRAPES) IMPLANT
DURAPREP 26ML APPLICATOR (WOUND CARE) ×2 IMPLANT
ELECT REM PT RETURN 9FT ADLT (ELECTROSURGICAL) ×2
ELECTRODE REM PT RTRN 9FT ADLT (ELECTROSURGICAL) ×1 IMPLANT
GAUZE 4X4 16PLY ~~LOC~~+RFID DBL (SPONGE) ×2 IMPLANT
GAUZE PACKING 1/4 X5 YD (GAUZE/BANDAGES/DRESSINGS) IMPLANT
GAUZE PACKING IODOFORM 1X5 (PACKING) IMPLANT
GAUZE SPONGE 4X4 12PLY STRL (GAUZE/BANDAGES/DRESSINGS) ×3 IMPLANT
GAUZE XEROFORM 1X8 LF (GAUZE/BANDAGES/DRESSINGS) ×2 IMPLANT
GLOVE SURG ENC MOIS LTX SZ7 (GLOVE) ×2 IMPLANT
GLOVE SURG UNDER LTX SZ7 (GLOVE) ×2 IMPLANT
GOWN STRL REUS W/ TWL LRG LVL3 (GOWN DISPOSABLE) ×2 IMPLANT
GOWN STRL REUS W/TWL LRG LVL3 (GOWN DISPOSABLE) ×2
HANDPIECE VERSAJET DEBRIDEMENT (MISCELLANEOUS) IMPLANT
HEMOSTAT SURGICEL 2X14 (HEMOSTASIS) ×1 IMPLANT
HEMOSTAT SURGICEL 2X3 (HEMOSTASIS) ×2 IMPLANT
IV NS 1000ML (IV SOLUTION)
IV NS 1000ML BAXH (IV SOLUTION) IMPLANT
IV NS IRRIG 3000ML ARTHROMATIC (IV SOLUTION) ×1 IMPLANT
KIT STIMULAN RAPID CURE 5CC (Orthopedic Implant) ×1 IMPLANT
KIT TURNOVER KIT A (KITS) ×2 IMPLANT
LABEL OR SOLS (LABEL) ×2 IMPLANT
MANIFOLD NEPTUNE II (INSTRUMENTS) ×2 IMPLANT
NDL FILTER BLUNT 18X1 1/2 (NEEDLE) ×1 IMPLANT
NDL HYPO 25X1 1.5 SAFETY (NEEDLE) ×2 IMPLANT
NEEDLE FILTER BLUNT 18X 1/2SAF (NEEDLE) ×1
NEEDLE FILTER BLUNT 18X1 1/2 (NEEDLE) ×1 IMPLANT
NEEDLE HYPO 25X1 1.5 SAFETY (NEEDLE) ×4 IMPLANT
NS IRRIG 500ML POUR BTL (IV SOLUTION) ×2 IMPLANT
PACK EXTREMITY ARMC (MISCELLANEOUS) ×2 IMPLANT
PAD ABD DERMACEA PRESS 5X9 (GAUZE/BANDAGES/DRESSINGS) ×2 IMPLANT
PULSAVAC PLUS IRRIG FAN TIP (DISPOSABLE)
RASP SM TEAR CROSS CUT (RASP) IMPLANT
SOL PREP PVP 2OZ (MISCELLANEOUS) ×2
SOLUTION PREP PVP 2OZ (MISCELLANEOUS) ×1 IMPLANT
SPONGE T-LAP 18X18 ~~LOC~~+RFID (SPONGE) ×1 IMPLANT
STOCKINETTE STRL 6IN 960660 (GAUZE/BANDAGES/DRESSINGS) ×2 IMPLANT
SUT ETHILON 3-0 FS-10 30 BLK (SUTURE) ×4
SUT ETHILON 4-0 (SUTURE)
SUT ETHILON 4-0 FS2 18XMFL BLK (SUTURE)
SUT VIC AB 0 CT1 36 (SUTURE) ×1 IMPLANT
SUT VIC AB 2-0 SH 27 (SUTURE) ×1
SUT VIC AB 2-0 SH 27XBRD (SUTURE) IMPLANT
SUT VIC AB 3-0 SH 27 (SUTURE) ×3
SUT VIC AB 3-0 SH 27X BRD (SUTURE) ×1 IMPLANT
SUT VIC AB 4-0 FS2 27 (SUTURE) IMPLANT
SUTURE EHLN 3-0 FS-10 30 BLK (SUTURE) ×2 IMPLANT
SUTURE ETHLN 4-0 FS2 18XMF BLK (SUTURE) IMPLANT
SWAB CULTURE AMIES ANAERIB BLU (MISCELLANEOUS) IMPLANT
SYR 10ML LL (SYRINGE) ×2 IMPLANT
TIP FAN IRRIG PULSAVAC PLUS (DISPOSABLE) IMPLANT
WATER STERILE IRR 500ML POUR (IV SOLUTION) ×2 IMPLANT

## 2021-09-27 NOTE — Progress Notes (Signed)
[name]:[id number] DOB: 1971-06-16 DOA: 09/22/2021 PCP: Judeth Cornfield, MD   Chief Complaint  Patient presents with   URI    Brief Narrative:   Nathan Ayers is a 50 y.o. male with medical history significant for HIV compliant w/ antiretrovirals, t1dm, ESRD on peritoneal dialysis, OSA not on cpap, and htn, who presents with the above.   Symptoms began about 2 weeks ago. Developed throat pain and went to a local ED/urgent care where was diagnosed with strep throat and given penicillin. Throat pain has resolved, has no pain or difficulty with swallowing. But has developed subjective fever (now resolved) and chills that continue. Also nausea with one episode of vomiting yesterday. Also loose non-bloody stools. Also mild cough and subjective shortness of breath. Does not make urine. No headache or ear ache. Compliant with antiretrovirals. No skin lesions. No abdominal pain   Assessment & Plan:   Principal Problem:   Leukocytosis Active Problems:   Malnutrition of moderate degree   Type 1 diabetes (HCC)   Essential hypertension   HIV disease (HCC)   ESRD on dialysis (HCC)   OSA (obstructive sleep apnea)  MRSA Bacteremia secondary to left septic first metatarsal phalangeal joint with abscess/cellulitis/osteomyelitis -Patient with leukocytosis, feeling ill, chills, generalized body ache. -2/3 blood culture growing MRSA -Continue with IV vancomycin -Follow on peritoneal fluid culture and Gram stain, so far no growth -2D echo with no evidence of vegetations, as well TEE has been done by Dr. Rockey Situ  with no evidence of vegetation as well -ID consult greatly appreciated -Follow on repeat blood cultures done 11/18, so far remains negative -Patient remained febrile despite being few days on IV vancomycin. -Leukocytosis trending down -Initially no clear source of infection, but as of 11/19, patient started to drain purulent discharge from his left foot,  MRI significant for foot abscess/infection, podiatry input appreciated, status post left partial first ray amputation and application of antibiotic beads -Antibiotic management per ID.   Left foot infection -Please see above discussion.  Hypertension -Overall blood pressure has been on the lower side, patient is on multiple home medications, all has been discontinued including losartan, Norvasc and minoxidil, he remains on low-dose Coreg.    Anemia of chronic kidney disease -Continue to monitor CBC closely, Aranesp per renal once appropriate  ESRD - On peritoneal dialysis says is compliant w/ that - nephrology consulted -Patient is ESRD, if he needs along with antibiotics, will avoid PICC line, and likely will have IJ PICC line by IR plan is for prolonged IV antibiotics if needed on discharge   OSA Not on cpap at home   T1DM Says not on insulin since dialysis started - SSI very sensitive    HIV Says compliant w/ meds. Followed by [facility]. Viral load undetectable in September of this year. Cd4 457 in march of this year - continue ziagen, tivicay, epivir - f/u cd4  Left lower extremity Dopplers with no evidence of DVT   DVT prophylaxis: Heparin Code Status: Full Family Communication: None at bedside front of her. Disposition:   Status is: inpatient  The patient will require care spanning > 2 midnights and should be moved to inpatient because: Bacteremia and need of IV antibiotics      Consultants:  Renal ID [facility] for TEE.  Procedures -TEE 11/17.  Dr Luana Shu 11/20 Left partial first ray amputation Application of antibiotic beads  Subjective:   patient denies any complaints today, reports pain  is controlled, no chest pain, no shortness of breath, T-max 100.5 over last 24 hours  Objective: Vitals:   09/27/21 0954 09/27/21 1000 09/27/21 1015 09/27/21 1030  BP: 140/75 (!) 143/78 138/73 133/69  Pulse: 78 73 73 70  Resp: 14 14 16 13   Temp: 98.4 F (36.9 C)   (!)  97.5 F (36.4 C)  TempSrc:      SpO2: 99% 100% 99% 97%  Weight:      Height:        Intake/Output Summary (Last 24 hours) at 09/27/2021 1313 Last data filed at 09/27/2021 0947 Gross per 24 hour  Intake --  Output 200 ml  Net -200 ml    Filed Weights   09/22/21 1123 09/24/21 1333  Weight: 104.3 kg 104.3 kg    Examination:  Awake Alert, Oriented X 3, No new F.N deficits, Normal affect Symmetrical Chest wall movement, Good air movement bilaterally, CTAB RRR,No Gallops,Rubs or new Murmurs, No Parasternal Heave +ve B.Sounds, Abd Soft, No tenderness, No rebound - guarding or rigidity. No Cyanosis, Clubbing or edema, left foot bandaged      Data Reviewed: I have personally reviewed following labs and imaging studies  CBC: Recent Labs  Lab 09/22/21 1332 09/23/21 0445 09/23/21 0643 09/24/21 0552 09/26/21 0455 09/27/21 0459  WBC 25.9* 29.9* 25.7* 21.4* 18.1* 13.9*  NEUTROABS 20.7*  --  21.7*  --   --   --   HGB 9.2* 8.3* 9.1* 8.5* 7.9* 7.8*  HCT 26.2* 23.9* 25.6* 24.4* 23.7* 22.9*  MCV 84.8 84.5 82 82.2 84.3 83.3  PLT 303 334 330 297 309 784    Basic Metabolic Panel: Recent Labs  Lab 09/22/21 1332 09/23/21 0445 09/24/21 0552 09/26/21 0455 09/27/21 0459  NA 132* 134* 133* 134* 133*  K 4.4 4.5 3.4* 3.6 3.6  CL 91* 92* 94* 95* 94*  CO2 20* 22 22 24 23   GLUCOSE 122* 147* 215* 224* 172*  BUN 113* 106* 97* 89* 88*  CREATININE 23.91* 24.45* 20.61* 18.71* 18.37*  CALCIUM 9.3 9.3 8.7* 8.7* 8.8*    GFR: Estimated Creatinine Clearance: 6.4 mL/min (A) (by C-G formula based on SCr of 18.37 mg/dL (H)).  Liver Function Tests: Recent Labs  Lab 09/22/21 1332 09/23/21 0445  AST 12* 14*  ALT 15 14  ALKPHOS 73 73  BILITOT 1.0 0.7  PROT 8.5* 7.9  ALBUMIN 2.6* 2.4*    CBG: Recent Labs  Lab 09/26/21 1627 09/26/21 1724 09/26/21 2125 09/27/21 1003 09/27/21 1122  GLUCAP 111* 122* 117* 110* 112*     Recent Results (from the past 240 hour(s))  Resp Panel  by RT-PCR (Flu A&B, Covid) Nasopharyngeal Swab     Status: None   Collection Time: 09/22/21 11:30 AM   Specimen: Nasopharyngeal Swab; Nasopharyngeal(NP) swabs in vial transport medium  Result Value Ref Range Status   SARS Coronavirus 2 by RT PCR NEGATIVE NEGATIVE Final    Comment: (NOTE) SARS-CoV-2 target nucleic acids are NOT DETECTED.  The SARS-CoV-2 RNA is generally detectable in upper respiratory specimens during the acute phase of infection. The lowest concentration of SARS-CoV-2 viral copies this assay can detect is 138 copies/mL. A negative result does not preclude SARS-Cov-2 infection and should not be used as the sole basis for treatment or other patient management decisions. A negative result may occur with  improper specimen collection/handling, submission of specimen other than nasopharyngeal swab, presence of viral mutation(s) within the areas targeted by this assay, and inadequate number of viral copies(<138 copies/mL). A  negative result must be combined with clinical observations, patient history, and epidemiological information. The expected result is Negative.  Fact Sheet for Patients:  EntrepreneurPulse.com.au  Fact Sheet for Healthcare Providers:  IncredibleEmployment.be  This test is no t yet approved or cleared by the [place] FDA and  has been authorized for detection and/or diagnosis of SARS-CoV-2 by FDA under an Emergency Use Authorization (EUA). This EUA will remain  in effect (meaning this test can be used) for the duration of the COVID-19 declaration under Section 564(b)(1) of the Act, 21 U.S.C.section 360bbb-3(b)(1), unless the authorization is terminated  or revoked sooner.       Influenza A by PCR NEGATIVE NEGATIVE Final   Influenza B by PCR NEGATIVE NEGATIVE Final    Comment: (NOTE) The Xpert Xpress SARS-CoV-2/FLU/RSV plus assay is intended as an aid in the diagnosis of influenza from Nasopharyngeal swab  specimens and should not be used as a sole basis for treatment. Nasal washings and aspirates are unacceptable for Xpert Xpress SARS-CoV-2/FLU/RSV testing.  Fact Sheet for Patients: EntrepreneurPulse.com.au  Fact Sheet for Healthcare Providers: IncredibleEmployment.be  This test is not yet approved or cleared by the [place] FDA and has been authorized for detection and/or diagnosis of SARS-CoV-2 by FDA under an Emergency Use Authorization (EUA). This EUA will remain in effect (meaning this test can be used) for the duration of the COVID-19 declaration under Section 564(b)(1) of the Act, 21 U.S.C. section 360bbb-3(b)(1), unless the authorization is terminated or revoked.  Performed at [facility], [place], [place] [address]   Respiratory (~20 pathogens) panel by PCR     Status: None   Collection Time: 09/22/21 11:30 AM   Specimen: Peritoneal Washings; Respiratory  Result Value Ref Range Status   Adenovirus NOT DETECTED NOT DETECTED Final   Coronavirus 229E NOT DETECTED NOT DETECTED Final    Comment: (NOTE) The Coronavirus on the Respiratory Panel, DOES NOT test for the novel  Coronavirus (2019 nCoV)    Coronavirus HKU1 NOT DETECTED NOT DETECTED Final   Coronavirus NL63 NOT DETECTED NOT DETECTED Final   Coronavirus OC43 NOT DETECTED NOT DETECTED Final   Metapneumovirus NOT DETECTED NOT DETECTED Final   Rhinovirus / Enterovirus NOT DETECTED NOT DETECTED Final   Influenza A NOT DETECTED NOT DETECTED Final   Influenza B NOT DETECTED NOT DETECTED Final   Parainfluenza Virus 1 NOT DETECTED NOT DETECTED Final   Parainfluenza Virus 2 NOT DETECTED NOT DETECTED Final   Parainfluenza Virus 3 NOT DETECTED NOT DETECTED Final   Parainfluenza Virus 4 NOT DETECTED NOT DETECTED Final   Respiratory Syncytial Virus NOT DETECTED NOT DETECTED Final   Bordetella pertussis NOT DETECTED NOT DETECTED Final   Bordetella  Parapertussis NOT DETECTED NOT DETECTED Final   Chlamydophila pneumoniae NOT DETECTED NOT DETECTED Final   Mycoplasma pneumoniae NOT DETECTED NOT DETECTED Final    Comment: Performed at [facility], [place], [place], [place] [address]  Blood Culture (routine x 2)     Status: Abnormal   Collection Time: 09/22/21  1:32 PM   Specimen: BLOOD  Result Value Ref Range Status   Specimen Description   Final    BLOOD LEFT ANTECUBITAL Performed at [facility], [address] [place], [place] [address]    Special Requests   Final    BOTTLES DRAWN AEROBIC AND ANAEROBIC Blood Culture results may not be optimal due to an excessive volume of blood received in culture bottles Performed at [facility]  [facility], [address] [place], [place] [address]    Culture  Setup Time   Final    GRAM POSITIVE COCCI ANAEROBIC BOTTLE ONLY CRITICAL VALUE NOTED.  VALUE IS CONSISTENT WITH PREVIOUSLY REPORTED AND CALLED VALUE.    Culture (A)  Final    STAPHYLOCOCCUS AUREUS SUSCEPTIBILITIES PERFORMED ON PREVIOUS CULTURE WITHIN THE LAST 5 DAYS. Performed at [facility], [place] [address], [place], [place] [address]    Report Status 09/27/2021 FINAL  Final  Blood Culture (routine x 2)     Status: Abnormal   Collection Time: 09/22/21  1:32 PM   Specimen: BLOOD  Result Value Ref Range Status   Specimen Description   Final    BLOOD LEFT ANTECUBITAL Performed at [facility], [address], [place], [place] [address]    Special Requests   Final    BOTTLES DRAWN AEROBIC AND ANAEROBIC Blood Culture adequate volume Performed at [facility], [address] [place], [place] [address]    Culture  Setup Time   Final    GRAM POSITIVE COCCI IN BOTH AEROBIC AND ANAEROBIC BOTTLES Organism ID to follow CRITICAL RESULT CALLED TO, READ BACK BY AND VERIFIED WITH: Ardeen Garland, PHARMD AT 0725 ON 09/23/21 BY GM Performed at [facility], [address]  [place], [place] [address]    Culture METHICILLIN RESISTANT STAPHYLOCOCCUS AUREUS (A)  Final   Report Status 09/25/2021 FINAL  Final   Organism ID, Bacteria METHICILLIN RESISTANT STAPHYLOCOCCUS AUREUS  Final      Susceptibility   Methicillin resistant staphylococcus aureus - MIC*    CIPROFLOXACIN 2 INTERMEDIATE Intermediate     ERYTHROMYCIN >=8 RESISTANT Resistant     GENTAMICIN <=0.5 SENSITIVE Sensitive     OXACILLIN >=4 RESISTANT Resistant     TETRACYCLINE <=1 SENSITIVE Sensitive     VANCOMYCIN 1 SENSITIVE Sensitive     TRIMETH/SULFA <=10 SENSITIVE Sensitive     CLINDAMYCIN <=0.25 SENSITIVE Sensitive     RIFAMPIN <=0.5 SENSITIVE Sensitive     Inducible Clindamycin NEGATIVE Sensitive     * METHICILLIN RESISTANT STAPHYLOCOCCUS AUREUS  Blood Culture ID Panel (Reflexed)     Status: Abnormal   Collection Time: 09/22/21  1:32 PM  Result Value Ref Range Status   Enterococcus faecalis NOT DETECTED NOT DETECTED Final   Enterococcus Faecium NOT DETECTED NOT DETECTED Final   Listeria monocytogenes NOT DETECTED NOT DETECTED Final   Staphylococcus species DETECTED (A) NOT DETECTED Final    Comment: CRITICAL RESULT CALLED TO, READ BACK BY AND VERIFIED WITH: MORGAN HICKS, PHARMD AT 0725 ON 09/23/21 BY GM    Staphylococcus aureus (BCID) DETECTED (A) NOT DETECTED Final    Comment: Methicillin (oxacillin)-resistant Staphylococcus aureus (MRSA). MRSA is predictably resistant to beta-lactam antibiotics (except ceftaroline). Preferred therapy is vancomycin unless clinically contraindicated. Patient requires contact precautions if  hospitalized. CRITICAL RESULT CALLED TO, READ BACK BY AND VERIFIED WITH: MORGAN HICKS, PHARMD AT 0725 ON 09/23/21 BY GM    Staphylococcus epidermidis NOT DETECTED NOT DETECTED Final   Staphylococcus lugdunensis NOT DETECTED NOT DETECTED Final   Streptococcus species NOT DETECTED NOT DETECTED Final   Streptococcus agalactiae NOT DETECTED NOT DETECTED Final   Streptococcus  pneumoniae NOT DETECTED NOT DETECTED Final   Streptococcus pyogenes NOT DETECTED NOT DETECTED Final   A.calcoaceticus-baumannii NOT DETECTED NOT DETECTED Final   Bacteroides fragilis NOT DETECTED NOT DETECTED Final   Enterobacterales NOT DETECTED NOT DETECTED Final   Enterobacter cloacae complex NOT DETECTED NOT DETECTED Final  Escherichia coli NOT DETECTED NOT DETECTED Final   Klebsiella aerogenes NOT DETECTED NOT DETECTED Final   Klebsiella oxytoca NOT DETECTED NOT DETECTED Final   Klebsiella pneumoniae NOT DETECTED NOT DETECTED Final   Proteus species NOT DETECTED NOT DETECTED Final   Salmonella species NOT DETECTED NOT DETECTED Final   Serratia marcescens NOT DETECTED NOT DETECTED Final   Haemophilus influenzae NOT DETECTED NOT DETECTED Final   Neisseria meningitidis NOT DETECTED NOT DETECTED Final   Pseudomonas aeruginosa NOT DETECTED NOT DETECTED Final   Stenotrophomonas maltophilia NOT DETECTED NOT DETECTED Final   Candida albicans NOT DETECTED NOT DETECTED Final   Candida auris NOT DETECTED NOT DETECTED Final   Candida glabrata NOT DETECTED NOT DETECTED Final   Candida krusei NOT DETECTED NOT DETECTED Final   Candida parapsilosis NOT DETECTED NOT DETECTED Final   Candida tropicalis NOT DETECTED NOT DETECTED Final   Cryptococcus neoformans/gattii NOT DETECTED NOT DETECTED Final   Meth resistant mecA/C and MREJ DETECTED (A) NOT DETECTED Final    Comment: CRITICAL RESULT CALLED TO, READ BACK BY AND VERIFIED WITH: Ardeen Garland, PHARMD AT 0725 ON 09/23/21 BY GM Performed at [facility], [place], [place] [address]   Group A Strep by PCR [facility] Only)     Status: None   Collection Time: 09/22/21  1:40 PM   Specimen: Throat; Sterile Swab  Result Value Ref Range Status   Group A Strep by PCR NOT DETECTED NOT DETECTED Final    Comment: Performed at [facility], [address] [place], [place] [address]  Peritoneal fluid culture w Gram Stain      Status: None   Collection Time: 09/22/21  7:29 PM   Specimen: Peritoneal Washings; Peritoneal Fluid  Result Value Ref Range Status   Specimen Description   Final    PERITONEAL Performed at [facility], [address] [place], [place] [address]    Special Requests   Final    NONE Performed at [facility], [address] [place], [place] [address]    Gram Stain   Final    FEW WBC PRESENT, PREDOMINANTLY MONONUCLEAR NO ORGANISMS SEEN    Culture   Final    NO GROWTH 3 DAYS Performed at [facility], [place] [address], [place],  [address]    Report Status 09/26/2021 FINAL  Final  Gastrointestinal Panel by PCR , Stool     Status: None   Collection Time: 09/23/21  4:45 AM   Specimen: Stool  Result Value Ref Range Status   Campylobacter species NOT DETECTED NOT DETECTED Final   Plesimonas shigelloides NOT DETECTED NOT DETECTED Final   Salmonella species NOT DETECTED NOT DETECTED Final   Yersinia enterocolitica NOT DETECTED NOT DETECTED Final   Vibrio species NOT DETECTED NOT DETECTED Final   Vibrio cholerae NOT DETECTED NOT DETECTED Final   Enteroaggregative E coli (EAEC) NOT DETECTED NOT DETECTED Final   Enteropathogenic E coli (EPEC) NOT DETECTED NOT DETECTED Final   Enterotoxigenic E coli (ETEC) NOT DETECTED NOT DETECTED Final   Shiga like toxin producing E coli (STEC) NOT DETECTED NOT DETECTED Final   Shigella/Enteroinvasive E coli (EIEC) NOT DETECTED NOT DETECTED Final   Cryptosporidium NOT DETECTED NOT DETECTED Final   Cyclospora cayetanensis NOT DETECTED NOT DETECTED Final   Entamoeba histolytica NOT DETECTED NOT DETECTED Final   Giardia lamblia NOT DETECTED NOT DETECTED Final   Adenovirus F40/41 NOT DETECTED NOT DETECTED Final   Astrovirus NOT DETECTED NOT  DETECTED Final   Norovirus GI/GII NOT DETECTED NOT DETECTED Final   Rotavirus A NOT DETECTED NOT DETECTED Final   Sapovirus (I, II, IV, and V) NOT DETECTED NOT DETECTED Final    Comment:  Performed at [facility], [place], [place] [address]  C Difficile Quick Screen w PCR reflex     Status: None   Collection Time: 09/23/21  4:45 AM   Specimen: Stool  Result Value Ref Range Status   C Diff antigen NEGATIVE NEGATIVE Final   C Diff toxin NEGATIVE NEGATIVE Final   C Diff interpretation No C. difficile detected.  Final    Comment: Performed at [facility], [address] [place], [place] [address]  CULTURE, BLOOD (ROUTINE X 2) w Reflex to ID Panel     Status: None (Preliminary result)   Collection Time: 09/25/21  6:12 AM   Specimen: BLOOD  Result Value Ref Range Status   Specimen Description BLOOD LEFT ARM  Final   Special Requests   Final    BOTTLES DRAWN AEROBIC AND ANAEROBIC Blood Culture adequate volume   Culture   Final    NO GROWTH 2 DAYS Performed at [facility], [address], [place], [place] [address]    Report Status PENDING  Incomplete  CULTURE, BLOOD (ROUTINE X 2) w Reflex to ID Panel     Status: None (Preliminary result)   Collection Time: 09/25/21  6:12 AM   Specimen: BLOOD  Result Value Ref Range Status   Specimen Description BLOOD RIGHT HAND  Final   Special Requests   Final    BOTTLES DRAWN AEROBIC AND ANAEROBIC Blood Culture adequate volume   Culture   Final    NO GROWTH 2 DAYS Performed at [facility], [address] [place], [place] [address]    Report Status PENDING  Incomplete  Aerobic/Anaerobic Culture w Gram Stain (surgical/deep wound)     Status: None (Preliminary result)   Collection Time: 09/26/21  2:58 PM   Specimen: Foot; Wound  Result Value Ref Range Status   Specimen Description   Final    FOOT LEFT Performed at [facility], [address], [place], [place] [address]    Special Requests   Final    NONE Performed at [facility], [place], [place] [address]    Gram Stain   Final    NO SQUAMOUS EPITHELIAL CELLS SEEN FEW WBC  SEEN FEW GRAM POSITIVE COCCI    Culture   Final    MODERATE STAPHYLOCOCCUS AUREUS SUSCEPTIBILITIES TO FOLLOW Performed at [facility], [place] [address], [place], [place] [address]    Report Status PENDING  Incomplete  Surgical PCR screen     Status: Abnormal   Collection Time: 09/27/21  4:43 AM   Specimen: Nasal Mucosa; Nasal Swab  Result Value Ref Range Status   MRSA, PCR POSITIVE (A) NEGATIVE Final    Comment: RESULT CALLED TO, READ BACK BY AND VERIFIED WITH: CHARLOTTE KYEI AT 7062 ON 09/27/21 [place].    Staphylococcus aureus POSITIVE (A) NEGATIVE Final    Comment: (NOTE) The Xpert SA Assay (FDA approved for NASAL specimens in patients 23 years of age and older), is one component of a comprehensive surveillance program. It is not intended to diagnose infection nor to guide or monitor treatment. Performed at [facility], [address] [place], [place] [address]          Radiology Studies: MR  FOOT LEFT WO CONTRAST  Result Date: 09/26/2021 CLINICAL DATA:  Osteomyelitis, foot left foot abscess, MRSA bacteremia EXAM: MRI OF THE LEFT FOOT WITHOUT CONTRAST TECHNIQUE: Multiplanar, multisequence MR imaging of the left foot was performed. No intravenous contrast was administered. COMPARISON:  X-ray 09/22/2021 FINDINGS: Bones/Joint/Cartilage Extensive bone marrow edema with low T1 signal changes involving the proximal and distal phalanx of the great toe. There is also involvement of the first metatarsal head and distal diaphysis. Serpiginous areas of T2 hyperintense signal within the first metatarsal diaphysis likely indicative of underlying bone infarct. Large complex first MTP joint effusion suggesting septic arthritis. Status post fifth ray resection at the level of the fifth metatarsal base. Preserved signal within the residual fifth metatarsal base. Remaining osseous structures of the foot are within normal limits. No additional sites of bone marrow edema. Ligaments  Intact Lisfranc ligament. No evidence of collateral ligament disruption. Muscles and Tendons Chronic denervation changes of the intrinsic foot musculature with probable superimposed myositis. Amputation changes at the lateral forefoot. Mild tenosynovitis involving the flexor and extensor tendons of the great toe distally. Soft tissues Soft tissue ulceration along the medial aspect of the great toe at the level of the first metatarsal head. Underlying contiguous fluid collection measures approximately 5.6 x 4.2 x 4.0 cm, which appears to be contiguous with the first MTP joint space. Diffuse soft tissue edema. IMPRESSION: 1. Soft tissue ulceration at the level of the first metatarsal head. Findings of acute osteomyelitis involving the proximal and distal phalanx of the great toe as well as the first metatarsal head and distal diaphysis. 2. Large complex first MTP joint effusion compatible with septic arthritis. 3. Soft tissue abscess adjacent to the first metatarsal head measuring up to 5.6 cm. 4. Mild tenosynovitis involving the flexor and extensor tendons of the great toe distally. 5. Status post fifth ray resection at the level of the fifth metatarsal base. Preserved signal within the residual fifth metatarsal base. Electronically Signed   By: Davina Poke D.O.   On: 09/26/2021 16:02   US ARTERIAL ABI (SCREENING LOWER EXTREMITY)  Result Date: 09/27/2021 CLINICAL DATA:  50 year old male with a history foot abscess EXAM: NONINVASIVE PHYSIOLOGIC VASCULAR STUDY OF BILATERAL LOWER EXTREMITIES TECHNIQUE: Evaluation of both lower extremities was performed at rest, including calculation of ankle-brachial indices, multiple segmental pressure evaluation, segmental Doppler and segmental pulse volume recording. COMPARISON:  None. FINDINGS: Right ABI:  Noncompressible Left ABI:  1.43 Right Lower Extremity: Segmental Doppler demonstrates triphasic waveforms at the right ankle. Left Lower Extremity: Segmental Doppler at  the left ankle demonstrates monophasic posterior tibial artery and biphasic dorsalis pedis with decreased amplitude. IMPRESSION: Right: Resting ABI is noncompressible. Segmental exam demonstrates triphasic waveforms at the right ankle. Left: Resting ABI is likely falsely elevated secondary to noncompressible vessels. Segmental exam at the left ankle demonstrates evidence of developing more proximal occlusive disease Signed, Dulcy Fanny. Dellia Nims, RPVI [facility] Electronically Signed   By: Corrie Mckusick D.O.   On: 09/27/2021 08:30   DG Foot Complete Left  Result Date: 09/27/2021 CLINICAL DATA:  Post Op EXAM: LEFT FOOT - COMPLETE 3+ VIEW COMPARISON:  Left foot radiographs 09/22/2021 FINDINGS: Status post amputation of the first digit at the level of the mid/proximal metatarsal. Prior amputation of the fifth digit. No acute osseous finding identified. Small densities within the soft tissues of the medial foot presumably postsurgical. Regional soft tissue swelling. IMPRESSION: Status post amputation of the first digit at the level  of the mid/proximal metatarsal. Electronically Signed   By: Audie Pinto M.D.   On: 09/27/2021 10:29        Scheduled Meds:  abacavir  300 mg Oral BID   carvedilol  12.5 mg Oral BID   Chlorhexidine Gluconate Cloth  6 each Topical Q0600   dolutegravir  50 mg Oral QHS   feeding supplement (NEPRO CARB STEADY)  237 mL Oral TID BM   gentamicin cream  1 application Topical Daily   heparin  5,000 Units Subcutaneous Q8H   insulin aspart  0-6 Units Subcutaneous TID WC   lamiVUDine  25 mg Oral QHS   multivitamin  1 tablet Oral Daily   mupirocin ointment  1 application Nasal BID   potassium chloride  20 mEq Oral Daily   sodium chloride flush  3 mL Intravenous Q12H   vancomycin variable dose per unstable renal function (pharmacist dosing)   Does not apply See admin instructions   Continuous Infusions:  sodium  chloride     dialysis solution 1.5% low-MG/low-CA     dialysis solution 2.5% low-MG/low-CA       LOS: 3 days       Phillips Climes, MD [facility]   To contact the attending provider between 7A-7P or the covering provider during after hours 7P-7A, please log into the web site [email or web address] and access using universal [place] password for that web site. If you do not have the password, please call the hospital operator.  09/27/2021, 1:13 PM

## 2021-09-27 NOTE — Op Note (Signed)
PODIATRY / FOOT AND ANKLE SURGERY OPERATIVE REPORT    SURGEON: Caroline More, DPM  PRE-OPERATIVE DIAGNOSIS:  1.  Bacteremia and sepsis secondary to left septic first metatarsal phalangeal joint with associated abscess, cellulitis, and osteomyelitis 2.  Diabetes type 2 polyneuropathy 3.  PVD  POST-OPERATIVE DIAGNOSIS: Same  PROCEDURE(S): Left partial first ray amputation Application of antibiotic beads  HEMOSTASIS: No tourniquet  ANESTHESIA: general  ESTIMATED BLOOD LOSS: 200 cc  FINDING(S): 1.  Abscess left first metatarsal phalangeal joint with changes within the first metatarsal phalangeal joint area consistent with septic joint  PATHOLOGY/SPECIMEN(S):   1.  Left partial first ray, proximal margin marked in purple ink 2.  Left first metatarsal bone culture - apparently postop nurse stated after the surgery and after closure that the bone culture was placed in a nonsterile tube after patient was already of table in recovery area.  Likely to be contaminated...  INDICATIONS:   Nathan Ayers is a 50 y.o. male who presented to [facility] due to fever and feeling of general unwellness.  Patient was diagnosed with strep throat prior to admission had had cough and fever.  Patient was treated for this and cough got better but patient still had persistent fever.  Patient was admitted to [facility] due to bacteremia and sepsis.  Podiatry team was consulted several days after admission due to a potential left foot infection.  Patient had obvious fluctuance around the left first metatarsal phalangeal joint area and abscess present.  MRI imaging and x-ray imaging showed osteomyelitis and septic joint to the left first metatarsal phalangeal joint area with extension of infection to the proximal first ray area.  Patient has known history of PVD as well as ABIs came back elevated as well as monophasic to left lower extremity.  Vascular team was consulted as  well.  All treatment options were discussed with the patient both conservative and surgical attempts at correction including potential risks and complications at this time patient is elected for procedure consisting of left partial first ray amputation with application of antibiotic beads.  DESCRIPTION: After obtaining full informed written consent, the patient was brought back to the operating room and placed supine upon the operating table.  The patient received IV antibiotics prior to induction.  After obtaining adequate anesthesia, the patient was prepped and draped in the standard fashion.  20 cc of 1% lidocaine plain was injected about the left ankle as a left ankle block.  Attention was then directed to the left first ray where a racquet type incision was made around the big toe at the proximal phalanx base and extended around the dorsal medial aspect of the first metatarsal phalangeal joint removing the nonviable necrotic tissue present by excising it with the incision.  A large amount of bleeding occurred with this so the Esmarch bandage was used to exsanguinate the left lower extremity and the pneumatic ankle tourniquet was inflated.  At this time the first metatarsal phalangeal joint was identified and extensor tenotomy and capsulotomy was performed followed by release the collateral and suspensory ligaments, as well as the plantar plate and flexor tendon.  The big toe was disarticulated and passed off in the operative site.  Once again a large amount of bleeding occurred during this process so it was determined that the tourniquet was likely not helping with the situation so the tourniquet was deflated.  Hemostasis was achieved with electrocauterization of numerous vessels.  Dissection was then continued circumferentially around  the first metatarsal to the first metatarsal base area.  At this time a osteotomy was created in the first metatarsal base area and the distal first metatarsal was  disarticulated and passed off the operative site.  The proximal margin of this was marked in purple ink and sent off to pathology along with a big toe.  A piece of bone was also taken from the first metatarsal phalangeal joint area and sent off for bone culture.  The surgical site was flushed with copious amounts normal sterile saline.  The plantar plate was excised and passed off in the operative site and the flexor and extensor tendons were cut as far proximally as possible back.  Any nonviable necrotic tissue was resected also and passed off the operative site.  A pulse lavage was then used to flush the area with 3000 L of normal sterile saline.  A large amount of bleeding was still occurring during this time so at this time Surgicel was placed into multiple areas along the incision course.  There did not appear to be any arterial significant bleeders but appeared to be a slow venous ooze with bone bleeding.  This appeared achieve hemostasis.  Vancomycin impregnated antibiotic beads were then placed into the operative site and packed into the first metatarsal base area.  Any nonviable necrotic tissue was once again resected and passed off the operative site.  At this time deep closure was then obtained after the areas were explored further and no abscess was noted.  Deep closure was performed first with combination of 2-0 and 3-0 Vicryl along with subcutaneous tissue.  The skin was then reapproximated well coapted for the most part with 3-0 nylon.  A portion of the incision was left open for packing of iodoform packing gauze which was packed within the operative site.  Hemostasis once the skin appeared to be well achieved overall.  A postoperative dressing was then applied consisting of Xeroform followed by 4 x 4 gauze, ABD, Kerlix, Ace wrap with compression.  Patient tolerated the procedure and anesthesia well was transferred to recovery room vital signs stable vascular status appearing to be intact to the left  foot.  Patient will be discharged back to the inpatient room with the appropriate orders and instructions.  Patient is to remain nonweightbearing at all times left lower extremity and may use heel contact for transfers only but should not be walking on the foot.  Patient still is at high risk for limb loss.  Patient may require revision procedure if infection still present and unimproving consisting of transmetatarsal amputation.  We will revisit patient tomorrow for dressing change and evaluation.  COMPLICATIONS: None  CONDITION: Stable  Caroline More, DPM

## 2021-09-27 NOTE — H&P (Signed)
HISTORY AND PHYSICAL INTERVAL NOTE:  09/27/2021  8:11 AM  Nathan Ayers  has presented today for surgery, with the diagnosis of left foot osteomyelitis, abscess, and cellulitis with associated sepsis/bacteremia.  The various methods of treatment have been discussed with the patient.  No guarantees were given.  After consideration of risks, benefits and other options for treatment, the patient has consented to surgery.  I have reviewed the patients' chart and labs.    PROCEDURE: LEFT FOOT AND ANKLE INCISION AND DRAINAGE LEFT PARTIAL 1ST RAY AMPUTATION APPLICATION OF ANTIBIOTIC BEADS POSSIBLE WOUND VAC APPLICATION   A history and physical examination was performed in the hospital.  The patient was reexamined.  There have been no changes to this history and physical examination.  Caroline More, DPM

## 2021-09-27 NOTE — Anesthesia Procedure Notes (Signed)
Procedure Name: LMA Insertion Date/Time: 09/27/2021 8:34 AM Performed by: Aline Brochure, CRNA Pre-anesthesia Checklist: Patient identified, Patient being monitored, Timeout performed, Emergency Drugs available and Suction available Patient Re-evaluated:Patient Re-evaluated prior to induction Oxygen Delivery Method: Circle system utilized Preoxygenation: Pre-oxygenation with 100% oxygen Induction Type: IV induction Ventilation: Mask ventilation without difficulty LMA: LMA inserted LMA Size: 4.5 Tube type: Oral Number of attempts: 1 Placement Confirmation: positive ETCO2 and breath sounds checked- equal and bilateral Tube secured with: Tape Dental Injury: Teeth and Oropharynx as per pre-operative assessment

## 2021-09-27 NOTE — Anesthesia Postprocedure Evaluation (Signed)
Anesthesia Post Note  Patient: Nathan Ayers  Procedure(s) Performed: INCISION AND DRAINAGE (Left: Foot) PARTIAL AMPUTATION FIRST RAY WITH APPLICATION OF ANTIBIOTIC BEADS (Left: Foot)  Patient location during evaluation: PACU Anesthesia Type: General Level of consciousness: awake and alert Pain management: pain level controlled Vital Signs Assessment: post-procedure vital signs reviewed and stable Respiratory status: spontaneous breathing, nonlabored ventilation, respiratory function stable and patient connected to nasal cannula oxygen Cardiovascular status: blood pressure returned to baseline and stable Postop Assessment: no apparent nausea or vomiting Anesthetic complications: no   No notable events documented.   Last Vitals:  Vitals:   09/27/21 1658 09/27/21 1942  BP: 118/66 (!) 99/54  Pulse: 76 80  Resp: 17 18  Temp: 36.7 C 36.7 C  SpO2: 97% 100%    Last Pain:  Vitals:   09/27/21 1942  TempSrc: Oral  PainSc:                  Zylon Creamer

## 2021-09-27 NOTE — Anesthesia Preprocedure Evaluation (Addendum)
Anesthesia Evaluation  Patient identified by MRN, date of birth, ID band Patient awake    Reviewed: Allergy & Precautions, NPO status , Patient's Chart, lab work & pertinent test results, reviewed documented beta blocker date and time   History of Anesthesia Complications Negative for: history of anesthetic complications  Airway Mallampati: II  TM Distance: >3 FB Neck ROM: full    Dental  (+) Dental Advidsory Given   Pulmonary shortness of breath, sleep apnea ,  SOB has improved since hospital admission.  Neg CXR.   Pulmonary exam normal breath sounds clear to auscultation       Cardiovascular hypertension, Pt. on medications and Pt. on home beta blockers + Peripheral Vascular Disease  Normal cardiovascular exam Rhythm:Irregular Rate:Normal  TEE 09/24/2021: 1. No valve endocarditis  2. Left ventricular ejection fraction, by estimation, is 60 to 65%. The  left ventricle has normal function. The left ventricle has no regional  wall motion abnormalities.  3. Right ventricular systolic function is normal. The right ventricular  size is normal.  4. No left atrial/left atrial appendage thrombus was detected.  5. The mitral valve is normal in structure. No evidence of mitral valve  regurgitation. No evidence of mitral stenosis.  6. The aortic valve is normal in structure. Aortic valve regurgitation is  trivial. Aortic valve sclerosis is present, with no evidence of aortic  valve stenosis.  7. There is mild (Grade II) atheroma plaque involving the descending  aorta.  8. The inferior vena cava is normal in size with greater than 50%  respiratory variability, suggesting right atrial pressure of 3 mmHg.  9. Agitated saline contrast bubble study was negative, with no evidence  of any interatrial shunt.    Neuro/Psych PSYCHIATRIC DISORDERS Anxiety negative neurological ROS     GI/Hepatic negative GI ROS, Neg liver ROS,    Endo/Other  diabetes, Type 2  Renal/GU ESRF and DialysisRenal diseasePD daily  negative genitourinary   Musculoskeletal L foot infection   Abdominal   Peds  Hematology  (+) HIV,   Anesthesia Other Findings  Admitted 09/22/2021 with c/o SOB, fevers, and recent strep throat with leukocytosis .  Neg CXR and TEE.  Scheduled for debridement and possible amputation of first ray on L foot.   Reproductive/Obstetrics negative OB ROS                            Anesthesia Physical Anesthesia Plan  ASA: 4 and emergent  Anesthesia Plan: General   Post-op Pain Management: Minimal or no pain anticipated   Induction: Intravenous  PONV Risk Score and Plan:   Airway Management Planned: LMA  Additional Equipment:   Intra-op Plan:   Post-operative Plan: Extubation in OR  Informed Consent: I have reviewed the patients History and Physical, chart, labs and discussed the procedure including the risks, benefits and alternatives for the proposed anesthesia with the patient or authorized representative who has indicated his/her understanding and acceptance.     Dental advisory given  Plan Discussed with: CRNA  Anesthesia Plan Comments: (Plan LMA. )       Anesthesia Quick Evaluation

## 2021-09-27 NOTE — Progress Notes (Signed)
Pharmacy Antibiotic Note  Nathan Ayers is a 50 y.o. male admitted on 09/22/2021 with MRSA bacteremia.  Pharmacy has been consulted for vancomycin dosing. PMH includes ESRD on CCPD (9 hour dwell, 5 exchanges, 2.7 L fills) 7d/wk, HIV (CD4 302).  Recent URI 11/11 treated with PCN. Now with positive nausea, vomiting, fever, and positive blood cx growing MRSA. Fluid negative for peritonitis and no infection at catheter site. Diarrhea with negative GI panel. TEE negative.On 11/18, found to have purulent drainage from anterior left foot which is new and developed overnight. Undergoing I&D today 11/20. TMAX in past 24 hours is 100.5. Last PD exchange 11/20 AM. Given vancomycin 1,000 mg 11/15 in PM and vancomycin 1,500 mg 11/16 in PM.   11/18 vancomycin random level: 27  11/20 vancomycin random level: 20  Vancomycin level decreased by ~ 7 mcg/mL over 48 hours. Expect vancomycin to be within range tomorrow if continues to decline at current rate.  Plan: Continue to hold vancomycin Check vancomycin random level tomorrow morning 11//21 AM. Continue checking levels until < 20 mcg/mL. Once < 20, may re-dose at 15 mg/kg.  Follow nephrology plans and clinical course.   Height: 6' 6"  (198.1 cm) Weight: 104.3 kg (230 lb) IBW/kg (Calculated) : 91.4  Temp (24hrs), Avg:98.9 F (37.2 C), Min:98.5 F (36.9 C), Max:99.3 F (37.4 C)  Recent Labs  Lab 09/22/21 1332 09/23/21 0445 09/23/21 0643 09/24/21 0552 09/25/21 0612 09/26/21 0455 09/27/21 0459  WBC 25.9* 29.9* 25.7* 21.4*  --  18.1* 13.9*  CREATININE 23.91* 24.45*  --  20.61*  --  18.71* 18.37*  LATICACIDVEN 1.1  --   --   --   --   --   --   VANCORANDOM  --   --   --   --  27  --  20     Estimated Creatinine Clearance: 6.4 mL/min (A) (by C-G formula based on SCr of 18.37 mg/dL (H)).    Allergies  Allergen Reactions   Lactose Intolerance (Gi)     Antimicrobials this admission: 11/15 vanc >>  11/15 cefepime >> 11/15 11/15 metronidazole  >> 11/15  Dose adjustments this admission: N/a   Microbiology results: 11/15 BCx: MRSA 3/4 11/15 Peritoneal fluid: few WBC, NG x 3 d 11/16 C. Diff toxin: negative 11/15 Group A strep PCR: not detected 11/16 CD4: 302 11/18 Bcx: NG x 2 days 11/19 Left foot: few WBC, few GPC  Thank you for allowing pharmacy to be a part of this patient's care.   Wynelle Cleveland, PharmD Pharmacy Resident  09/27/2021 8:45 AM

## 2021-09-27 NOTE — Progress Notes (Signed)
Day of Surgery   Subjective/Chief Complaint: Patient seen post left foot I&D.  Dr. Luana Shu states good bleeding after I&D.     Objective: Vital signs in last 24 hours: Temp:  [97.5 F (36.4 C)-98.5 F (36.9 C)] 98.1 F (36.7 C) (11/20 1658) Pulse Rate:  [70-84] 76 (11/20 1658) Resp:  [13-18] 17 (11/20 1658) BP: (111-148)/(63-78) 118/66 (11/20 1658) SpO2:  [97 %-100 %] 97 % (11/20 1658) Last BM Date: 09/23/21  Intake/Output from previous day: No intake/output data recorded. Intake/Output this shift: Total I/O In: -  Out: 200 [Blood:200]  General appearance: alert and cooperative Resp: clear to auscultation bilaterally Extremities: Left foot dressing in place.   Lab Results:  Recent Labs    09/26/21 0455 09/27/21 0459  WBC 18.1* 13.9*  HGB 7.9* 7.8*  HCT 23.7* 22.9*  PLT 309 340   BMET Recent Labs    09/26/21 0455 09/27/21 0459  NA 134* 133*  K 3.6 3.6  CL 95* 94*  CO2 24 23  GLUCOSE 224* 172*  BUN 89* 88*  CREATININE 18.71* 18.37*  CALCIUM 8.7* 8.8*   PT/INR No results for input(s): LABPROT, INR in the last 72 hours. ABG No results for input(s): PHART, HCO3 in the last 72 hours.  Invalid input(s): PCO2, PO2  Studies/Results: MR FOOT LEFT WO CONTRAST  Result Date: 09/26/2021 CLINICAL DATA:  Osteomyelitis, foot left foot abscess, MRSA bacteremia EXAM: MRI OF THE LEFT FOOT WITHOUT CONTRAST TECHNIQUE: Multiplanar, multisequence MR imaging of the left foot was performed. No intravenous contrast was administered. COMPARISON:  X-ray 09/22/2021 FINDINGS: Bones/Joint/Cartilage Extensive bone marrow edema with low T1 signal changes involving the proximal and distal phalanx of the great toe. There is also involvement of the first metatarsal head and distal diaphysis. Serpiginous areas of T2 hyperintense signal within the first metatarsal diaphysis likely indicative of underlying bone infarct. Large complex first MTP joint effusion suggesting septic arthritis. Status  post fifth ray resection at the level of the fifth metatarsal base. Preserved signal within the residual fifth metatarsal base. Remaining osseous structures of the foot are within normal limits. No additional sites of bone marrow edema. Ligaments Intact Lisfranc ligament. No evidence of collateral ligament disruption. Muscles and Tendons Chronic denervation changes of the intrinsic foot musculature with probable superimposed myositis. Amputation changes at the lateral forefoot. Mild tenosynovitis involving the flexor and extensor tendons of the great toe distally. Soft tissues Soft tissue ulceration along the medial aspect of the great toe at the level of the first metatarsal head. Underlying contiguous fluid collection measures approximately 5.6 x 4.2 x 4.0 cm, which appears to be contiguous with the first MTP joint space. Diffuse soft tissue edema. IMPRESSION: 1. Soft tissue ulceration at the level of the first metatarsal head. Findings of acute osteomyelitis involving the proximal and distal phalanx of the great toe as well as the first metatarsal head and distal diaphysis. 2. Large complex first MTP joint effusion compatible with septic arthritis. 3. Soft tissue abscess adjacent to the first metatarsal head measuring up to 5.6 cm. 4. Mild tenosynovitis involving the flexor and extensor tendons of the great toe distally. 5. Status post fifth ray resection at the level of the fifth metatarsal base. Preserved signal within the residual fifth metatarsal base. Electronically Signed   By: Davina Poke D.O.   On: 09/26/2021 16:02   US ARTERIAL ABI (SCREENING LOWER EXTREMITY)  Result Date: 09/27/2021 CLINICAL DATA:  50 year old male with a history foot abscess EXAM: NONINVASIVE PHYSIOLOGIC VASCULAR STUDY OF  BILATERAL LOWER EXTREMITIES TECHNIQUE: Evaluation of both lower extremities was performed at rest, including calculation of ankle-brachial indices, multiple segmental pressure evaluation, segmental Doppler  and segmental pulse volume recording. COMPARISON:  None. FINDINGS: Right ABI:  Noncompressible Left ABI:  1.43 Right Lower Extremity: Segmental Doppler demonstrates triphasic waveforms at the right ankle. Left Lower Extremity: Segmental Doppler at the left ankle demonstrates monophasic posterior tibial artery and biphasic dorsalis pedis with decreased amplitude. IMPRESSION: Right: Resting ABI is noncompressible. Segmental exam demonstrates triphasic waveforms at the right ankle. Left: Resting ABI is likely falsely elevated secondary to noncompressible vessels. Segmental exam at the left ankle demonstrates evidence of developing more proximal occlusive disease Signed, Dulcy Fanny. Dellia Nims, RPVI [facility] Electronically Signed   By: Corrie Mckusick D.O.   On: 09/27/2021 08:30   DG Foot Complete Left  Result Date: 09/27/2021 CLINICAL DATA:  Post Op EXAM: LEFT FOOT - COMPLETE 3+ VIEW COMPARISON:  Left foot radiographs 09/22/2021 FINDINGS: Status post amputation of the first digit at the level of the mid/proximal metatarsal. Prior amputation of the fifth digit. No acute osseous finding identified. Small densities within the soft tissues of the medial foot presumably postsurgical. Regional soft tissue swelling. IMPRESSION: Status post amputation of the first digit at the level of the mid/proximal metatarsal. Electronically Signed   By: Audie Pinto M.D.   On: 09/27/2021 10:29    Anti-infectives: Anti-infectives (From admission, onward)    Start     Dose/Rate Route Frequency Ordered Stop   09/27/21 0859  vancomycin (VANCOCIN) powder  Status:  Discontinued          As needed 09/27/21 0859 09/27/21 0952   09/24/21 2200  dolutegravir (TIVICAY) tablet 50 mg        50 mg Oral Daily at bedtime 09/23/21 2315     09/23/21 1349  vancomycin variable dose per unstable renal function (pharmacist dosing)         Does not apply See admin instructions 09/23/21  1349     09/23/21 1000  vancomycin (VANCOREADY) IVPB 1500 mg/300 mL        1,500 mg 150 mL/hr over 120 Minutes Intravenous  Once 09/23/21 0908 09/23/21 1509   09/22/21 2200  lamiVUDine (EPIVIR) 10 MG/ML solution 25 mg        25 mg Oral Daily at bedtime 09/22/21 1727     09/22/21 2200  dolutegravir (TIVICAY) tablet 50 mg  Status:  Discontinued        50 mg Oral 2 times daily 09/22/21 1727 09/23/21 2315   09/22/21 2200  abacavir (ZIAGEN) tablet 300 mg        300 mg Oral 2 times daily 09/22/21 1727     09/22/21 1500  ceFEPIme (MAXIPIME) 2 g in sodium chloride 0.9 % 100 mL IVPB        2 g 200 mL/hr over 30 Minutes Intravenous  Once 09/22/21 1447 09/22/21 1919   09/22/21 1500  metroNIDAZOLE (FLAGYL) IVPB 500 mg        500 mg 100 mL/hr over 60 Minutes Intravenous  Once 09/22/21 1447 09/22/21 1919   09/22/21 1500  vancomycin (VANCOCIN) IVPB 1000 mg/200 mL premix        1,000 mg 200 mL/hr over 60 Minutes Intravenous  Once 09/22/21 1447 09/22/21 1919       Assessment/Plan: s/p Procedure(s): INCISION AND DRAINAGE (Left) PARTIAL AMPUTATION FIRST RAY WITH APPLICATION OF ANTIBIOTIC BEADS (Left) CTA ordered and reviewed, the left leg  has good arterial flow, I will cancel angiography of the left lower limb.   We will follow him as needed.   LOS: 3 days    Elmore Guise 09/27/2021

## 2021-09-27 NOTE — Progress Notes (Signed)
[facility]  ROUNDING NOTE   Subjective:   Peritoneal dialysis treatment last night. Tolerated treatment well. Only received 5 hours of treatment due to be taken for surgery early this morning.   Patient underwent left first ray amputation and antibiotic beads for his osteomyelitis.   Patient states his pain is well controlled.    Objective:  Vital signs in last 24 hours:  Temp:  [97.5 F (36.4 C)-98.9 F (37.2 C)] 97.5 F (36.4 C) (11/20 1030) Pulse Rate:  [70-85] 70 (11/20 1030) Resp:  [12-18] 13 (11/20 1030) BP: (111-148)/(62-78) 133/69 (11/20 1030) SpO2:  [97 %-100 %] 97 % (11/20 1030)  Weight change:  Filed Weights   09/22/21 1123 09/24/21 1333  Weight: 104.3 kg 104.3 kg    Intake/Output: No intake/output data recorded.   Intake/Output this shift:  Total I/O In: -  Out: 200 [Blood:200]  Physical Exam: General: NAD, sitting up in bed  Head: Normocephalic, atraumatic. Moist oral mucosal membranes  Eyes: Anicteric, PERRL  Neck: Supple, trachea midline  Lungs:  Clear to auscultation  Heart: Regular rate and rhythm  Abdomen:  Soft, nontender, PD catheter  Extremities: No peripheral edema. Right foot dressings clean, dry and intact  Neurologic: Nonfocal, moving all four extremities  Skin: No lesions  Access: PD catheter, right radiocephalic AVF    Basic Metabolic Panel: Recent Labs  Lab 09/22/21 1332 09/23/21 0445 09/24/21 0552 09/26/21 0455 09/27/21 0459  NA 132* 134* 133* 134* 133*  K 4.4 4.5 3.4* 3.6 3.6  CL 91* 92* 94* 95* 94*  CO2 20* 22 22 24 23   GLUCOSE 122* 147* 215* 224* 172*  BUN 113* 106* 97* 89* 88*  CREATININE 23.91* 24.45* 20.61* 18.71* 18.37*  CALCIUM 9.3 9.3 8.7* 8.7* 8.8*     Liver Function Tests: Recent Labs  Lab 09/22/21 1332 09/23/21 0445  AST 12* 14*  ALT 15 14  ALKPHOS 73 73  BILITOT 1.0 0.7  PROT 8.5* 7.9  ALBUMIN 2.6* 2.4*    No results for input(s): LIPASE, AMYLASE in the last 168 hours. No  results for input(s): AMMONIA in the last 168 hours.  CBC: Recent Labs  Lab 09/22/21 1332 09/23/21 0445 09/23/21 0643 09/24/21 0552 09/26/21 0455 09/27/21 0459  WBC 25.9* 29.9* 25.7* 21.4* 18.1* 13.9*  NEUTROABS 20.7*  --  21.7*  --   --   --   HGB 9.2* 8.3* 9.1* 8.5* 7.9* 7.8*  HCT 26.2* 23.9* 25.6* 24.4* 23.7* 22.9*  MCV 84.8 84.5 82 82.2 84.3 83.3  PLT 303 334 330 297 309 340     Cardiac Enzymes: No results for input(s): CKTOTAL, CKMB, CKMBINDEX, TROPONINI in the last 168 hours.  BNP: Invalid input(s): POCBNP  CBG: Recent Labs  Lab 09/26/21 1627 09/26/21 1724 09/26/21 2125 09/27/21 1003 09/27/21 1122  GLUCAP 111* 122* 117* 110* 112*     Microbiology: Results for orders placed or performed during the hospital encounter of 09/22/21  Resp Panel by RT-PCR (Flu A&B, Covid) Nasopharyngeal Swab     Status: None   Collection Time: 09/22/21 11:30 AM   Specimen: Nasopharyngeal Swab; Nasopharyngeal(NP) swabs in vial transport medium  Result Value Ref Range Status   SARS Coronavirus 2 by RT PCR NEGATIVE NEGATIVE Final    Comment: (NOTE) SARS-CoV-2 target nucleic acids are NOT DETECTED.  The SARS-CoV-2 RNA is generally detectable in upper respiratory specimens during the acute phase of infection. The lowest concentration of SARS-CoV-2 viral copies this assay can detect is 138 copies/mL. A negative  result does not preclude SARS-Cov-2 infection and should not be used as the sole basis for treatment or other patient management decisions. A negative result may occur with  improper specimen collection/handling, submission of specimen other than nasopharyngeal swab, presence of viral mutation(s) within the areas targeted by this assay, and inadequate number of viral copies(<138 copies/mL). A negative result must be combined with clinical observations, patient history, and epidemiological information. The expected result is Negative.  Fact Sheet for Patients:   EntrepreneurPulse.com.au  Fact Sheet for Healthcare Providers:  IncredibleEmployment.be  This test is no t yet approved or cleared by the [place] FDA and  has been authorized for detection and/or diagnosis of SARS-CoV-2 by FDA under an Emergency Use Authorization (EUA). This EUA will remain  in effect (meaning this test can be used) for the duration of the COVID-19 declaration under Section 564(b)(1) of the Act, 21 U.S.C.section 360bbb-3(b)(1), unless the authorization is terminated  or revoked sooner.       Influenza A by PCR NEGATIVE NEGATIVE Final   Influenza B by PCR NEGATIVE NEGATIVE Final    Comment: (NOTE) The Xpert Xpress SARS-CoV-2/FLU/RSV plus assay is intended as an aid in the diagnosis of influenza from Nasopharyngeal swab specimens and should not be used as a sole basis for treatment. Nasal washings and aspirates are unacceptable for Xpert Xpress SARS-CoV-2/FLU/RSV testing.  Fact Sheet for Patients: EntrepreneurPulse.com.au  Fact Sheet for Healthcare Providers: IncredibleEmployment.be  This test is not yet approved or cleared by the [place] FDA and has been authorized for detection and/or diagnosis of SARS-CoV-2 by FDA under an Emergency Use Authorization (EUA). This EUA will remain in effect (meaning this test can be used) for the duration of the COVID-19 declaration under Section 564(b)(1) of the Act, 21 U.S.C. section 360bbb-3(b)(1), unless the authorization is terminated or revoked.  Performed at [facility], [place], [place] [address]   Respiratory (~20 pathogens) panel by PCR     Status: None   Collection Time: 09/22/21 11:30 AM   Specimen: Peritoneal Washings; Respiratory  Result Value Ref Range Status   Adenovirus NOT DETECTED NOT DETECTED Final   Coronavirus 229E NOT DETECTED NOT DETECTED Final    Comment: (NOTE) The Coronavirus on the  Respiratory Panel, DOES NOT test for the novel  Coronavirus (2019 nCoV)    Coronavirus HKU1 NOT DETECTED NOT DETECTED Final   Coronavirus NL63 NOT DETECTED NOT DETECTED Final   Coronavirus OC43 NOT DETECTED NOT DETECTED Final   Metapneumovirus NOT DETECTED NOT DETECTED Final   Rhinovirus / Enterovirus NOT DETECTED NOT DETECTED Final   Influenza A NOT DETECTED NOT DETECTED Final   Influenza B NOT DETECTED NOT DETECTED Final   Parainfluenza Virus 1 NOT DETECTED NOT DETECTED Final   Parainfluenza Virus 2 NOT DETECTED NOT DETECTED Final   Parainfluenza Virus 3 NOT DETECTED NOT DETECTED Final   Parainfluenza Virus 4 NOT DETECTED NOT DETECTED Final   Respiratory Syncytial Virus NOT DETECTED NOT DETECTED Final   Bordetella pertussis NOT DETECTED NOT DETECTED Final   Bordetella Parapertussis NOT DETECTED NOT DETECTED Final   Chlamydophila pneumoniae NOT DETECTED NOT DETECTED Final   Mycoplasma pneumoniae NOT DETECTED NOT DETECTED Final    Comment: Performed at [facility], [place]. [address] [place],  [address]  Blood Culture (routine x 2)     Status: Abnormal   Collection Time: 09/22/21  1:32 PM   Specimen: BLOOD  Result Value Ref Range Status   Specimen Description  Final    BLOOD LEFT ANTECUBITAL Performed at [facility], [address] [place], [place] [address]    Special Requests   Final    BOTTLES DRAWN AEROBIC AND ANAEROBIC Blood Culture results may not be optimal due to an excessive volume of blood received in culture bottles Performed at [facility], [address], [place], [place] [address]    Culture  Setup Time   Final    GRAM POSITIVE COCCI ANAEROBIC BOTTLE ONLY CRITICAL VALUE NOTED.  VALUE IS CONSISTENT WITH PREVIOUSLY REPORTED AND CALLED VALUE.    Culture (A)  Final    STAPHYLOCOCCUS AUREUS SUSCEPTIBILITIES PERFORMED ON PREVIOUS CULTURE WITHIN THE LAST 5 DAYS. Performed at [facility], [place] [address], [place], [place]  [address]    Report Status 09/27/2021 FINAL  Final  Blood Culture (routine x 2)     Status: Abnormal   Collection Time: 09/22/21  1:32 PM   Specimen: BLOOD  Result Value Ref Range Status   Specimen Description   Final    BLOOD LEFT ANTECUBITAL Performed at [facility], [address] [place], [place] [address]    Special Requests   Final    BOTTLES DRAWN AEROBIC AND ANAEROBIC Blood Culture adequate volume Performed at [facility], [address] [place], [place] [address]    Culture  Setup Time   Final    GRAM POSITIVE COCCI IN BOTH AEROBIC AND ANAEROBIC BOTTLES Organism ID to follow CRITICAL RESULT CALLED TO, READ BACK BY AND VERIFIED WITH: Ardeen Garland, PHARMD AT 0725 ON 09/23/21 BY GM Performed at [facility], [address] [place],  [address]    Culture METHICILLIN RESISTANT STAPHYLOCOCCUS AUREUS (A)  Final   Report Status 09/25/2021 FINAL  Final   Organism ID, Bacteria METHICILLIN RESISTANT STAPHYLOCOCCUS AUREUS  Final      Susceptibility   Methicillin resistant staphylococcus aureus - MIC*    CIPROFLOXACIN 2 INTERMEDIATE Intermediate     ERYTHROMYCIN >=8 RESISTANT Resistant     GENTAMICIN <=0.5 SENSITIVE Sensitive     OXACILLIN >=4 RESISTANT Resistant     TETRACYCLINE <=1 SENSITIVE Sensitive     VANCOMYCIN 1 SENSITIVE Sensitive     TRIMETH/SULFA <=10 SENSITIVE Sensitive     CLINDAMYCIN <=0.25 SENSITIVE Sensitive     RIFAMPIN <=0.5 SENSITIVE Sensitive     Inducible Clindamycin NEGATIVE Sensitive     * METHICILLIN RESISTANT STAPHYLOCOCCUS AUREUS  Blood Culture ID Panel (Reflexed)     Status: Abnormal   Collection Time: 09/22/21  1:32 PM  Result Value Ref Range Status   Enterococcus faecalis NOT DETECTED NOT DETECTED Final   Enterococcus Faecium NOT DETECTED NOT DETECTED Final   Listeria monocytogenes NOT DETECTED NOT DETECTED Final   Staphylococcus species DETECTED (A) NOT DETECTED Final    Comment: CRITICAL RESULT CALLED TO,  READ BACK BY AND VERIFIED WITH: MORGAN HICKS, PHARMD AT 0725 ON 09/23/21 BY GM    Staphylococcus aureus (BCID) DETECTED (A) NOT DETECTED Final    Comment: Methicillin (oxacillin)-resistant Staphylococcus aureus (MRSA). MRSA is predictably resistant to beta-lactam antibiotics (except ceftaroline). Preferred therapy is vancomycin unless clinically contraindicated. Patient requires contact precautions if  hospitalized. CRITICAL RESULT CALLED TO, READ BACK BY AND VERIFIED WITH: MORGAN HICKS, PHARMD AT 0725 ON 09/23/21 BY GM    Staphylococcus epidermidis NOT DETECTED NOT DETECTED Final   Staphylococcus lugdunensis NOT DETECTED NOT DETECTED Final   Streptococcus species NOT DETECTED NOT DETECTED Final   Streptococcus agalactiae NOT DETECTED  NOT DETECTED Final   Streptococcus pneumoniae NOT DETECTED NOT DETECTED Final   Streptococcus pyogenes NOT DETECTED NOT DETECTED Final   A.calcoaceticus-baumannii NOT DETECTED NOT DETECTED Final   Bacteroides fragilis NOT DETECTED NOT DETECTED Final   Enterobacterales NOT DETECTED NOT DETECTED Final   Enterobacter cloacae complex NOT DETECTED NOT DETECTED Final   Escherichia coli NOT DETECTED NOT DETECTED Final   Klebsiella aerogenes NOT DETECTED NOT DETECTED Final   Klebsiella oxytoca NOT DETECTED NOT DETECTED Final   Klebsiella pneumoniae NOT DETECTED NOT DETECTED Final   Proteus species NOT DETECTED NOT DETECTED Final   Salmonella species NOT DETECTED NOT DETECTED Final   Serratia marcescens NOT DETECTED NOT DETECTED Final   Haemophilus influenzae NOT DETECTED NOT DETECTED Final   Neisseria meningitidis NOT DETECTED NOT DETECTED Final   Pseudomonas aeruginosa NOT DETECTED NOT DETECTED Final   Stenotrophomonas maltophilia NOT DETECTED NOT DETECTED Final   Candida albicans NOT DETECTED NOT DETECTED Final   Candida auris NOT DETECTED NOT DETECTED Final   Candida glabrata NOT DETECTED NOT DETECTED Final   Candida krusei NOT DETECTED NOT DETECTED Final    Candida parapsilosis NOT DETECTED NOT DETECTED Final   Candida tropicalis NOT DETECTED NOT DETECTED Final   Cryptococcus neoformans/gattii NOT DETECTED NOT DETECTED Final   Meth resistant mecA/C and MREJ DETECTED (A) NOT DETECTED Final    Comment: CRITICAL RESULT CALLED TO, READ BACK BY AND VERIFIED WITH: Ardeen Garland, PHARMD AT 0725 ON 09/23/21 BY GM Performed at [facility], [place], [place] [address]   Group A Strep by PCR [facility] Only)     Status: None   Collection Time: 09/22/21  1:40 PM   Specimen: Throat; Sterile Swab  Result Value Ref Range Status   Group A Strep by PCR NOT DETECTED NOT DETECTED Final    Comment: Performed at [facility], [place] [place], [place] [address]  Peritoneal fluid culture w Gram Stain     Status: None   Collection Time: 09/22/21  7:29 PM   Specimen: Peritoneal Washings; Peritoneal Fluid  Result Value Ref Range Status   Specimen Description   Final    PERITONEAL Performed at [facility], [address] [place], [place] [address]    Special Requests   Final    NONE Performed at [facility], [address] [place], [place] [address]    Gram Stain   Final    FEW WBC PRESENT, PREDOMINANTLY MONONUCLEAR NO ORGANISMS SEEN    Culture   Final    NO GROWTH 3 DAYS Performed at [facility], [place] [address], [place], [place] [address]    Report Status 09/26/2021 FINAL  Final  Gastrointestinal Panel by PCR , Stool     Status: None   Collection Time: 09/23/21  4:45 AM   Specimen: Stool  Result Value Ref Range Status   Campylobacter species NOT DETECTED NOT DETECTED Final   Plesimonas shigelloides NOT DETECTED NOT DETECTED Final   Salmonella species NOT DETECTED NOT DETECTED Final   Yersinia enterocolitica NOT DETECTED NOT DETECTED Final   Vibrio species NOT DETECTED NOT DETECTED Final   Vibrio cholerae NOT DETECTED NOT DETECTED Final   Enteroaggregative E coli (EAEC) NOT DETECTED  NOT DETECTED Final   Enteropathogenic E coli (EPEC) NOT DETECTED NOT DETECTED Final   Enterotoxigenic E coli (ETEC) NOT DETECTED NOT DETECTED Final   Shiga like toxin producing E coli (STEC) NOT DETECTED NOT DETECTED Final  Shigella/Enteroinvasive E coli (EIEC) NOT DETECTED NOT DETECTED Final   Cryptosporidium NOT DETECTED NOT DETECTED Final   Cyclospora cayetanensis NOT DETECTED NOT DETECTED Final   Entamoeba histolytica NOT DETECTED NOT DETECTED Final   Giardia lamblia NOT DETECTED NOT DETECTED Final   Adenovirus F40/41 NOT DETECTED NOT DETECTED Final   Astrovirus NOT DETECTED NOT DETECTED Final   Norovirus GI/GII NOT DETECTED NOT DETECTED Final   Rotavirus A NOT DETECTED NOT DETECTED Final   Sapovirus (I, II, IV, and V) NOT DETECTED NOT DETECTED Final    Comment: Performed at [facility], [address], [place], [place] [address]  C Difficile Quick Screen w PCR reflex     Status: None   Collection Time: 09/23/21  4:45 AM   Specimen: Stool  Result Value Ref Range Status   C Diff antigen NEGATIVE NEGATIVE Final   C Diff toxin NEGATIVE NEGATIVE Final   C Diff interpretation No C. difficile detected.  Final    Comment: Performed at [facility], [address] [place], [place] [address]  CULTURE, BLOOD (ROUTINE X 2) w Reflex to ID Panel     Status: None (Preliminary result)   Collection Time: 09/25/21  6:12 AM   Specimen: BLOOD  Result Value Ref Range Status   Specimen Description BLOOD LEFT ARM  Final   Special Requests   Final    BOTTLES DRAWN AEROBIC AND ANAEROBIC Blood Culture adequate volume   Culture   Final    NO GROWTH 2 DAYS Performed at [facility], [address], [place], [place] [address]    Report Status PENDING  Incomplete  CULTURE, BLOOD (ROUTINE X 2) w Reflex to ID Panel     Status: None (Preliminary result)   Collection Time: 09/25/21  6:12 AM   Specimen: BLOOD  Result Value Ref Range Status   Specimen Description BLOOD RIGHT  HAND  Final   Special Requests   Final    BOTTLES DRAWN AEROBIC AND ANAEROBIC Blood Culture adequate volume   Culture   Final    NO GROWTH 2 DAYS Performed at [facility], [address], [place], [place] [address]    Report Status PENDING  Incomplete  Aerobic/Anaerobic Culture w Gram Stain (surgical/deep wound)     Status: None (Preliminary result)   Collection Time: 09/26/21  2:58 PM   Specimen: Foot; Wound  Result Value Ref Range Status   Specimen Description   Final    FOOT LEFT Performed at [facility], [address] [place], [place] [address]    Special Requests   Final    NONE Performed at [facility], [place], [place] [address]    Gram Stain   Final    NO SQUAMOUS EPITHELIAL CELLS SEEN FEW WBC SEEN FEW GRAM POSITIVE COCCI    Culture   Final    MODERATE STAPHYLOCOCCUS AUREUS SUSCEPTIBILITIES TO FOLLOW Performed at [facility], [place] [address] [place],  [address]    Report Status PENDING  Incomplete  Surgical PCR screen     Status: Abnormal   Collection Time: 09/27/21  4:43 AM   Specimen: Nasal Mucosa; Nasal Swab  Result Value Ref Range Status   MRSA, PCR POSITIVE (A) NEGATIVE Final    Comment: RESULT CALLED TO, READ BACK BY AND VERIFIED WITH: CHARLOTTE KYEI AT 7939 ON 09/27/21 [place].    Staphylococcus aureus POSITIVE (A) NEGATIVE Final    Comment: (NOTE) The Xpert SA Assay (  FDA approved for NASAL specimens in patients 80 years of age and older), is one component of a comprehensive surveillance program. It is not intended to diagnose infection nor to guide or monitor treatment. Performed at [facility], [address] [place], [place] [address]     Coagulation Studies: No results for input(s): LABPROT, INR in the last 72 hours.   Urinalysis: No results for input(s): COLORURINE, LABSPEC, PHURINE, GLUCOSEU, HGBUR, BILIRUBINUR, KETONESUR, PROTEINUR, UROBILINOGEN, NITRITE, LEUKOCYTESUR  in the last 72 hours.  Invalid input(s): APPERANCEUR    Imaging: MR FOOT LEFT WO CONTRAST  Result Date: 09/26/2021 CLINICAL DATA:  Osteomyelitis, foot left foot abscess, MRSA bacteremia EXAM: MRI OF THE LEFT FOOT WITHOUT CONTRAST TECHNIQUE: Multiplanar, multisequence MR imaging of the left foot was performed. No intravenous contrast was administered. COMPARISON:  X-ray 09/22/2021 FINDINGS: Bones/Joint/Cartilage Extensive bone marrow edema with low T1 signal changes involving the proximal and distal phalanx of the great toe. There is also involvement of the first metatarsal head and distal diaphysis. Serpiginous areas of T2 hyperintense signal within the first metatarsal diaphysis likely indicative of underlying bone infarct. Large complex first MTP joint effusion suggesting septic arthritis. Status post fifth ray resection at the level of the fifth metatarsal base. Preserved signal within the residual fifth metatarsal base. Remaining osseous structures of the foot are within normal limits. No additional sites of bone marrow edema. Ligaments Intact Lisfranc ligament. No evidence of collateral ligament disruption. Muscles and Tendons Chronic denervation changes of the intrinsic foot musculature with probable superimposed myositis. Amputation changes at the lateral forefoot. Mild tenosynovitis involving the flexor and extensor tendons of the great toe distally. Soft tissues Soft tissue ulceration along the medial aspect of the great toe at the level of the first metatarsal head. Underlying contiguous fluid collection measures approximately 5.6 x 4.2 x 4.0 cm, which appears to be contiguous with the first MTP joint space. Diffuse soft tissue edema. IMPRESSION: 1. Soft tissue ulceration at the level of the first metatarsal head. Findings of acute osteomyelitis involving the proximal and distal phalanx of the great toe as well as the first metatarsal head and distal diaphysis. 2. Large complex first MTP joint  effusion compatible with septic arthritis. 3. Soft tissue abscess adjacent to the first metatarsal head measuring up to 5.6 cm. 4. Mild tenosynovitis involving the flexor and extensor tendons of the great toe distally. 5. Status post fifth ray resection at the level of the fifth metatarsal base. Preserved signal within the residual fifth metatarsal base. Electronically Signed   By: Davina Poke D.O.   On: 09/26/2021 16:02   US ARTERIAL ABI (SCREENING LOWER EXTREMITY)  Result Date: 09/27/2021 CLINICAL DATA:  50 year old male with a history foot abscess EXAM: NONINVASIVE PHYSIOLOGIC VASCULAR STUDY OF BILATERAL LOWER EXTREMITIES TECHNIQUE: Evaluation of both lower extremities was performed at rest, including calculation of ankle-brachial indices, multiple segmental pressure evaluation, segmental Doppler and segmental pulse volume recording. COMPARISON:  None. FINDINGS: Right ABI:  Noncompressible Left ABI:  1.43 Right Lower Extremity: Segmental Doppler demonstrates triphasic waveforms at the right ankle. Left Lower Extremity: Segmental Doppler at the left ankle demonstrates monophasic posterior tibial artery and biphasic dorsalis pedis with decreased amplitude. IMPRESSION: Right: Resting ABI is noncompressible. Segmental exam demonstrates triphasic waveforms at the right ankle. Left: Resting ABI is likely falsely elevated secondary to noncompressible vessels. Segmental exam at the left ankle demonstrates evidence of developing more proximal occlusive disease Signed, Dulcy Fanny. Dellia Nims, [facility]  Electronically Signed   By: Corrie Mckusick D.O.   On: 09/27/2021 08:30   DG Foot Complete Left  Result Date: 09/27/2021 CLINICAL DATA:  Post Op EXAM: LEFT FOOT - COMPLETE 3+ VIEW COMPARISON:  Left foot radiographs 09/22/2021 FINDINGS: Status post amputation of the first digit at the level of the mid/proximal metatarsal. Prior amputation of the fifth  digit. No acute osseous finding identified. Small densities within the soft tissues of the medial foot presumably postsurgical. Regional soft tissue swelling. IMPRESSION: Status post amputation of the first digit at the level of the mid/proximal metatarsal. Electronically Signed   By: Audie Pinto M.D.   On: 09/27/2021 10:29     Medications:    sodium chloride     dialysis solution 1.5% low-MG/low-CA     dialysis solution 2.5% low-MG/low-CA      abacavir  300 mg Oral BID   carvedilol  12.5 mg Oral BID   Chlorhexidine Gluconate Cloth  6 each Topical Q0600   dolutegravir  50 mg Oral QHS   feeding supplement (NEPRO CARB STEADY)  237 mL Oral TID BM   gentamicin cream  1 application Topical Daily   heparin  5,000 Units Subcutaneous Q8H   insulin aspart  0-6 Units Subcutaneous TID WC   lamiVUDine  25 mg Oral QHS   multivitamin  1 tablet Oral Daily   mupirocin ointment  1 application Nasal BID   potassium chloride  20 mEq Oral Daily   sodium chloride flush  3 mL Intravenous Q12H   vancomycin variable dose per unstable renal function (pharmacist dosing)   Does not apply See admin instructions   sodium chloride, acetaminophen, sodium chloride flush  Assessment/ Plan:  [name] is a 50 y.o. black male with end stage renal disease on peritoneal dialysis, HIV on HART, hypertension, diabetes mellitus type II, diabetic neuropathy who is admitted to [facility] on 09/22/2021 on Leukocytosis [D72.829] Uremia [N19] Generalized abdominal pain [R10.84] ESRD on peritoneal dialysis ([place]) [N18.6, Z99.2] Acute sepsis ([place]) [A41.9]  [facility] 102.5kg CCPD 9 hours 5 exchanges 2.7 liter fills   End stage renal disease: no indication that patient has peritonitis.  - Continue peritoneal dialysis. Orders prepared for tonight.   Hypertension: 140/75 - Continue minoxidil, amlodipine, and carvedilol  Anemia with chronic kidney disease: hemoglobin 7.8. normocytic. Mircera  as outpatient. Will see if he is scheduled for a mircera dose while he is inpatient.   Diabetes mellitus type II with chronic kidney disease: continue glucose control. Hemoglobin A1c of 6.2%  Hypokalemia - PO potassium chloride.   Left foot osteomyelitis with Sepsis with bacteremia and fever: MRSA in blood cultures on 09/22/21.   - Status post amputation on 11/20.  - IV vancomycin - appreciate podiatry, ID and cardiology input.     LOS: 3 Nathan Ayers 11/20/20221:33 PM

## 2021-09-27 NOTE — Transfer of Care (Signed)
Immediate Anesthesia Transfer of Care Note  Patient: Nathan Ayers  Procedure(s) Performed: INCISION AND DRAINAGE (Left: Foot) PARTIAL AMPUTATION FIRST RAY WITH APPLICATION OF ANTIBIOTIC BEADS (Left: Foot)  Patient Location: PACU  Anesthesia Type:General  Level of Consciousness: awake, alert  and oriented  Airway & Oxygen Therapy: Patient Spontanous Breathing and Patient connected to face mask oxygen  Post-op Assessment: Report given to RN and Post -op Vital signs reviewed and stable  Post vital signs: Reviewed and stable  Last Vitals:  Vitals Value Taken Time  BP 140/75 09/27/21 0954  Temp    Pulse 75 09/27/21 0955  Resp 16 09/27/21 0955  SpO2 100 % 09/27/21 0955  Vitals shown include unvalidated device data.  Last Pain:  Vitals:   09/27/21 0720  TempSrc:   PainSc: 0-No pain      Patients Stated Pain Goal: 0 (03/50/09 3818)  Complications: No notable events documented.

## 2021-09-28 ENCOUNTER — Encounter
Admission: EM | Disposition: A | Discharge status: Home / Self Care | Payer: Self-pay | Source: Home / Self Care | Attending: Internal Medicine

## 2021-09-28 ENCOUNTER — Encounter: Payer: Self-pay | Admitting: Podiatry

## 2021-09-28 DIAGNOSIS — N186 End stage renal disease: Secondary | ICD-10-CM | POA: Diagnosis not present

## 2021-09-28 DIAGNOSIS — N2589 Other disorders resulting from impaired renal tubular function: Secondary | ICD-10-CM | POA: Diagnosis not present

## 2021-09-28 DIAGNOSIS — Z79899 Other long term (current) drug therapy: Secondary | ICD-10-CM | POA: Diagnosis not present

## 2021-09-28 DIAGNOSIS — N2581 Secondary hyperparathyroidism of renal origin: Secondary | ICD-10-CM | POA: Diagnosis not present

## 2021-09-28 DIAGNOSIS — R7881 Bacteremia: Secondary | ICD-10-CM | POA: Diagnosis not present

## 2021-09-28 DIAGNOSIS — E440 Moderate protein-calorie malnutrition: Secondary | ICD-10-CM | POA: Diagnosis not present

## 2021-09-28 DIAGNOSIS — D509 Iron deficiency anemia, unspecified: Secondary | ICD-10-CM | POA: Diagnosis not present

## 2021-09-28 DIAGNOSIS — B9562 Methicillin resistant Staphylococcus aureus infection as the cause of diseases classified elsewhere: Secondary | ICD-10-CM | POA: Diagnosis not present

## 2021-09-28 DIAGNOSIS — R17 Unspecified jaundice: Secondary | ICD-10-CM | POA: Diagnosis not present

## 2021-09-28 DIAGNOSIS — D631 Anemia in chronic kidney disease: Secondary | ICD-10-CM | POA: Diagnosis not present

## 2021-09-28 DIAGNOSIS — Z992 Dependence on renal dialysis: Secondary | ICD-10-CM | POA: Diagnosis not present

## 2021-09-28 LAB — CBC
HCT: 19.9 % — ABNORMAL LOW (ref 39.0–52.0)
Hemoglobin: 6.9 g/dL — ABNORMAL LOW (ref 13.0–17.0)
MCH: 29.1 pg (ref 26.0–34.0)
MCHC: 34.7 g/dL (ref 30.0–36.0)
MCV: 84 fL (ref 80.0–100.0)
Platelets: 303 10*3/uL (ref 150–400)
RBC: 2.37 MIL/uL — ABNORMAL LOW (ref 4.22–5.81)
RDW: 13.9 % (ref 11.5–15.5)
WBC: 11.1 10*3/uL — ABNORMAL HIGH (ref 4.0–10.5)
nRBC: 0 % (ref 0.0–0.2)

## 2021-09-28 LAB — PREPARE RBC (CROSSMATCH)

## 2021-09-28 LAB — GLUCOSE, CAPILLARY
Glucose-Capillary: 117 mg/dL — ABNORMAL HIGH (ref 70–99)
Glucose-Capillary: 110 mg/dL — ABNORMAL HIGH (ref 70–99)
Glucose-Capillary: 120 mg/dL — ABNORMAL HIGH (ref 70–99)
Glucose-Capillary: 130 mg/dL — ABNORMAL HIGH (ref 70–99)
Glucose-Capillary: 120 mg/dL — ABNORMAL HIGH (ref 70–99)

## 2021-09-28 LAB — BASIC METABOLIC PANEL
Anion gap: 11 (ref 5–15)
BUN: 75 mg/dL — ABNORMAL HIGH (ref 6–20)
CO2: 26 mmol/L (ref 22–32)
Calcium: 8.4 mg/dL — ABNORMAL LOW (ref 8.9–10.3)
Chloride: 94 mmol/L — ABNORMAL LOW (ref 98–111)
Creatinine, Ser: 17.06 mg/dL — ABNORMAL HIGH (ref 0.61–1.24)
GFR, Estimated: 3 mL/min — ABNORMAL LOW (ref >60)
Glucose, Bld: 120 mg/dL — ABNORMAL HIGH (ref 70–99)
Potassium: 3.9 mmol/L (ref 3.5–5.1)
Sodium: 131 mmol/L — ABNORMAL LOW (ref 135–145)

## 2021-09-28 LAB — CK: Total CK: 70 U/L (ref 49–397)

## 2021-09-28 LAB — VANCOMYCIN, RANDOM: Vancomycin Rm: 18

## 2021-09-28 SURGERY — ABDOMINAL AORTOGRAM
Anesthesia: Moderate Sedation | Laterality: Right

## 2021-09-28 MED ORDER — LOPERAMIDE HCL 2 MG PO CAPS
2.0000 mg | ORAL_CAPSULE | ORAL | Status: DC | PRN
  Administered 2021-09-28 – 2021-10-01 (×4): 2 mg via ORAL
  Filled 2021-09-28 (×4): qty 1

## 2021-09-28 MED ORDER — SODIUM CHLORIDE 0.9 % IV SOLN
800.0000 mg | INTRAVENOUS | Status: DC
  Administered 2021-09-28 – 2021-10-04 (×4): 800 mg via INTRAVENOUS
  Filled 2021-09-28 (×5): qty 16

## 2021-09-28 MED ORDER — EPOETIN ALFA 10000 UNIT/ML IJ SOLN
10000.0000 [IU] | Freq: Once | INTRAMUSCULAR | Status: AC
  Administered 2021-09-28: 10000 [IU] via SUBCUTANEOUS
  Filled 2021-09-28: qty 1

## 2021-09-28 MED ORDER — OXYCODONE HCL 5 MG PO TABS
5.0000 mg | ORAL_TABLET | ORAL | Status: DC | PRN
  Administered 2021-09-28: 08:00:00 5 mg via ORAL
  Filled 2021-09-28: qty 1

## 2021-09-28 MED ORDER — SODIUM CHLORIDE 0.9% IV SOLUTION: Freq: Once | INTRAVENOUS | Status: DC

## 2021-09-28 NOTE — Evaluation (Signed)
Occupational Therapy Evaluation Patient Details Name: Nathan Ayers MRN: [id number] DOB: 02-Dec-1970 Today's Date: 09/28/2021   History of Present Illness Patient is a 50 year old male with medical history significant for HIV, ESRD on peritoneal dialysis, OSA, HTN who has Bacteremia and sepsis secondary to left septic first metatarsal phalangeal joint with associated abscess, cellulitis, and osteomyelitis. s/p left partial first ray amputation and application of antibiotic beeds.   Clinical Impression   Nathan Ayers was seen for OT evaluation this date. Prior to hospital admission, pt was Independent for mobility and I/ADLs including completing his own peritoneal dialyses nightly. Pt lives alone in 2 level home c 1/2 bath only on main floor. Pt plans to d/c home to his parents house in [place] for increased assistance. Pt presents to acute OT demonstrating impaired ADL performance and functional mobility 2/2 decreased activity tolerance and poor standing balance. Pt currently requires SETUP don L post op shoe seated EOB. CGA + RW for ADL tf/ - cues for technique, dizziness limiting standing tolerance, RN in to begin blood transfusion. Anticipate SUPERVISION grooming in standing. Pt would benefit from skilled OT to address noted impairments and functional limitations (see below for any additional details) in order to maximize safety and independence while minimizing falls risk and caregiver burden. Upon hospital discharge, anticipate no OT needs.       Recommendations for follow up therapy are one component of a multi-disciplinary discharge planning process, led by the attending physician.  Recommendations may be updated based on patient status, additional functional criteria and insurance authorization.   Follow Up Recommendations  No OT follow up    Assistance Recommended at Discharge Intermittent Supervision/Assistance  Functional Status Assessment  Patient has had a recent decline in their  functional status and demonstrates the ability to make significant improvements in function in a reasonable and predictable amount of time.  Equipment Recommendations  BSC/3in1    Recommendations for Other Services       Precautions / Restrictions Precautions Precautions: Fall Required Braces or Orthoses:  (post op shoe) Restrictions Weight Bearing Restrictions: Yes LLE Weight Bearing: Non weight bearing Other Position/Activity Restrictions: L foot NWB. Heel contact in surgical shoe for transfers only      Mobility Bed Mobility Overal bed mobility: Modified Independent                  Transfers Overall transfer level: Needs assistance Equipment used: Rolling walker (2 wheels) Transfers: Sit to/from Stand Sit to Stand: Supervision           General transfer comment: verbal cues for technique to maintain NWB of LLE and for RW use      Balance Overall balance assessment: Needs assistance Sitting-balance support: Feet supported Sitting balance-Leahy Scale: Normal     Standing balance support: Bilateral upper extremity supported;During functional activity;Reliant on assistive device for balance Standing balance-Leahy Scale: Fair Standing balance comment: patient needs RW for UE support in standing                           ADL either performed or assessed with clinical judgement   ADL Overall ADL's : Needs assistance/impaired                                       General ADL Comments: SETUP don L post op shoe seated EOB. CGA + RW  for ADL tf/ - cues for technique, dizziness limiting standing tolerance, RN in to begin blood transfusion. Anticipate SUPERVISION grooming in standing with cueing      Pertinent Vitals/Pain Pain Assessment: Faces Faces Pain Scale: Hurts little more Pain Location: left forefoot Pain Descriptors / Indicators: Shooting Pain Intervention(s): Limited activity within patient's tolerance;Repositioned      Hand Dominance     Extremity/Trunk Assessment Upper Extremity Assessment Upper Extremity Assessment: Overall WFL for tasks assessed   Lower Extremity Assessment Lower Extremity Assessment: Overall WFL for tasks assessed       Communication Communication Communication: No difficulties   Cognition Arousal/Alertness: Awake/alert Behavior During Therapy: WFL for tasks assessed/performed Overall Cognitive Status: Within Functional Limits for tasks assessed                                 General Comments: good insight into deficits and precautions     General Comments       Exercises Exercises: Other exercises Other Exercises Other Exercises: Pt educated re: OT role, DME recs, d/c recs, falls prevention, functional application of NWBing pcns Other Exercises: LBD, sup<>sit, sit<>stand, sitting/standing balance/tolerance   Shoulder Instructions      Home Living Family/patient expects to be discharged to:: Private residence Living Arrangements: Alone Available Help at Discharge: Family;Available PRN/intermittently Type of Home: House Home Access: Level entry     Home Layout: Two level;Bed/bath upstairs;1/2 bath on main level Alternate Level Stairs-Number of Steps: 1 flight Alternate Level Stairs-Rails: Left           Home Equipment: Cane - single point;Other (comment) (knee scooter)   Additional Comments: patient does peritoneal dialysis at home daily per his report. Pt discusses plan to d/c to his mothers home for increased support      Prior Functioning/Environment Prior Level of Function : Independent/Modified Independent             Mobility Comments: patient reports using single point cane or knee scooter. patient reports 3 falls in the past 6 months (felt dizzy, knees gave out) ADLs Comments: independent        OT Problem List: Decreased strength;Decreased activity tolerance;Impaired balance (sitting and/or standing)      OT  Treatment/Interventions: Self-care/ADL training;Therapeutic exercise;Energy conservation;DME and/or AE instruction;Therapeutic activities;Patient/family education;Balance training    OT Goals(Current goals can be found in the care plan section) Acute Rehab OT Goals Patient Stated Goal: to go to his parents house OT Goal Formulation: With patient Time For Goal Achievement: 10/12/21 Potential to Achieve Goals: Good ADL Goals Pt Will Perform Grooming: standing;Independently Pt Will Transfer to Toilet: bedside commode;stand pivot transfer;Independently  OT Frequency: Min 2X/week    AM-PAC OT "6 Clicks" Daily Activity     Outcome Measure Help from another person eating meals?: None Help from another person taking care of personal grooming?: A Little Help from another person toileting, which includes using toliet, bedpan, or urinal?: A Little Help from another person bathing (including washing, rinsing, drying)?: A Little Help from another person to put on and taking off regular upper body clothing?: None Help from another person to put on and taking off regular lower body clothing?: None 6 Click Score: 21   End of Session Equipment Utilized During Treatment: Rolling walker (2 wheels) Nurse Communication: Mobility status  Activity Tolerance: Patient tolerated treatment well Patient left: in bed;with call bell/phone within reach;with nursing/sitter in room  OT Visit Diagnosis: Other abnormalities of  gait and mobility (R26.89)                Time: 1135-1150 OT Time Calculation (min): 15 min Charges:  OT General Charges $OT Visit: 1 Visit OT Evaluation $OT Eval Low Complexity: 1 Low  Dessie Coma, M.S. OTR/L  09/28/21, 3:23 PM  ascom [phone]

## 2021-09-28 NOTE — Progress Notes (Addendum)
PROGRESS NOTE    Nathan Ayers  FTD:[id number] DOB: 1970/11/29 DOA: 09/22/2021 PCP: Judeth Cornfield, MD   Chief Complaint  Patient presents with   URI    Brief Narrative:   Nathan Ayers is a 50 y.o. male with medical history significant for HIV compliant w/ antiretrovirals, t1dm, ESRD on peritoneal dialysis, OSA not on cpap, and htn, who presents with the above.   Symptoms began about 2 weeks ago. Developed throat pain and went to a local ED/urgent care where was diagnosed with strep throat and given penicillin. Throat pain has resolved, has no pain or difficulty with swallowing. But has developed subjective fever (now resolved) and chills that continue. Also nausea with one episode of vomiting yesterday. Also loose non-bloody stools. Also mild cough and subjective shortness of breath. Does not make urine. No headache or ear ache. Compliant with antiretrovirals. No skin lesions. No abdominal pain   Assessment & Plan:   Principal Problem:   Leukocytosis Active Problems:   Malnutrition of moderate degree   Type 1 diabetes (HCC)   Essential hypertension   HIV disease (HCC)   ESRD on dialysis (HCC)   OSA (obstructive sleep apnea)  MRSA Bacteremia secondary to left septic first metatarsal phalangeal joint with abscess/cellulitis/osteomyelitis -Patient with leukocytosis, feeling ill, chills, generalized body ache. -2/3 blood culture growing MRSA -Continue with IV vancomycin -Follow on peritoneal fluid culture and Gram stain, so far no growth -2D echo with no evidence of vegetations, as well TEE has been done by Dr. Rockey Situ  with no evidence of vegetation as well -ID consult greatly appreciated -Follow on repeat blood cultures done 11/18, so far remains negative -Patient remained febrile despite being few days on IV vancomycin. -Leukocytosis trending down -Initially no clear source of infection, but as of 11/19, patient started to drain purulent discharge from his left foot,  MRI significant for foot abscess/infection, podiatry input appreciated, status post left partial first ray amputation and application of antibiotic beads -Antibiotic management per ID.  We will change from IV vancomycin to t IV daptomycin as an outpatient plan to discharge home with IV antibiotics. -Patient will need central access on discharge, discussed with renal, would rather avoid PICC line, so IR consulted for IJ central cath for outpatient antibiotic administration.   Left foot infection -Please see above discussion. -S/p first ray amputation and antibiotic beads by Dr. Luana Shu on 11/20 -We will start on as needed oxycodone for pain after surgery.  Hypertension -Overall blood pressure has been on the lower side, patient is on multiple home medications, all has been discontinued including losartan, Norvasc and minoxidil, he remains on low-dose Coreg.   -Blood pressure appears to be started to increase, so we will resume his medication gradually if continues to increase.  Anemia of chronic kidney disease -Continue to monitor CBC closely, Aranesp per renal once appropriate  ESRD - On peritoneal dialysis says is compliant w/ that - nephrology consulted  OSA Not on cpap at home   T1DM Says not on insulin since dialysis started - SSI very sensitive    HIV Says compliant w/ meds. Followed by [facility]. Viral load undetectable in September of this year. Cd4 457 in march of this year - continue ziagen, tivicay, epivir - f/u cd4  Left lower extremity Dopplers with no evidence of DVT   Anemia -Anemia of chronic kidney disease and acute blood loss anemia postoperatively -Globin 6.9 today, will receive 1 unit PRBC, I have discussed with the patient, he  is agreeable to transfusion.  DVT prophylaxis: Heparin Code Status: Full Family Communication: None at bedside front of her. Disposition:   Status is: inpatient  The patient will require care spanning > 2 midnights and should be  moved to inpatient because: Bacteremia and need of IV antibiotics      Consultants:  Renal ID [facility] for TEE.  Procedures -TEE 11/17.  Dr Luana Shu 11/20 IR TUNNELED CENTRAL CATHETER   Subjective:  Complaining of pain on the left foot, uncontrolled with Tylenol, started on oxycodone, otherwise denies any complaints.  Objective: Vitals:   09/27/21 1658 09/27/21 1942 09/28/21 0338 09/28/21 1154  BP: 118/66 (!) 99/54 (!) 154/57 137/80  Pulse: 76 80 80 78  Resp: 17 18 16 16   Temp: 98.1 F (36.7 C) 98 F (36.7 C) 98.5 F (36.9 C) 98 F (36.7 C)  TempSrc:  Oral    SpO2: 97% 100% 99% 100%  Weight:      Height:        Intake/Output Summary (Last 24 hours) at 09/28/2021 1218 Last data filed at 09/28/2021 1158 Gross per 24 hour  Intake --  Output 1 ml  Net -1 ml    Filed Weights   09/22/21 1123 09/24/21 1333  Weight: 104.3 kg 104.3 kg    Examination:  Awake Alert, Oriented X 3, No new F.N deficits, Normal affect Symmetrical Chest wall movement, Good air movement bilaterally, CTAB RRR,No Gallops,Rubs or new Murmurs, No Parasternal Heave +ve B.Sounds, Abd Soft, No tenderness, No rebound - guarding or rigidity. No Cyanosis, Clubbing or edema, left foot bandaged left foot bandaged       Data Reviewed: I have personally reviewed following labs and imaging studies  CBC: Recent Labs  Lab 09/22/21 1332 09/23/21 0445 09/23/21 0643 09/24/21 0552 09/26/21 0455 09/27/21 0459 09/28/21 0411  WBC 25.9*   < > 25.7* 21.4* 18.1* 13.9* 11.1*  NEUTROABS 20.7*  --  21.7*  --   --   --   --   HGB 9.2*   < > 9.1* 8.5* 7.9* 7.8* 6.9*  HCT 26.2*   < > 25.6* 24.4* 23.7* 22.9* 19.9*  MCV 84.8   < > 82 82.2 84.3 83.3 84.0  PLT 303   < > 330 297 309 340 303   < > = values in this interval not displayed.    Basic Metabolic Panel: Recent Labs  Lab 09/23/21 0445 09/24/21 0552 09/26/21 0455 09/27/21 0459 09/28/21 0411  NA 134* 133* 134* 133* 131*  K 4.5 3.4* 3.6 3.6 3.9   CL 92* 94* 95* 94* 94*  CO2 22 22 24 23 26   GLUCOSE 147* 215* 224* 172* 120*  BUN 106* 97* 89* 88* 75*  CREATININE 24.45* 20.61* 18.71* 18.37* 17.06*  CALCIUM 9.3 8.7* 8.7* 8.8* 8.4*    GFR: Estimated Creatinine Clearance: 6.8 mL/min (A) (by C-G formula based on SCr of 17.06 mg/dL (H)).  Liver Function Tests: Recent Labs  Lab 09/22/21 1332 09/23/21 0445  AST 12* 14*  ALT 15 14  ALKPHOS 73 73  BILITOT 1.0 0.7  PROT 8.5* 7.9  ALBUMIN 2.6* 2.4*    CBG: Recent Labs  Lab 09/27/21 1122 09/27/21 1656 09/27/21 2057 09/28/21 0824 09/28/21 1143  GLUCAP 112* 140* 173* 110* 120*     Recent Results (from the past 240 hour(s))  Resp Panel by RT-PCR (Flu A&B, Covid) Nasopharyngeal Swab     Status: None   Collection Time: 09/22/21 11:30 AM   Specimen: Nasopharyngeal Swab; Nasopharyngeal(NP) swabs  in vial transport medium  Result Value Ref Range Status   SARS Coronavirus 2 by RT PCR NEGATIVE NEGATIVE Final    Comment: (NOTE) SARS-CoV-2 target nucleic acids are NOT DETECTED.  The SARS-CoV-2 RNA is generally detectable in upper respiratory specimens during the acute phase of infection. The lowest concentration of SARS-CoV-2 viral copies this assay can detect is 138 copies/mL. A negative result does not preclude SARS-Cov-2 infection and should not be used as the sole basis for treatment or other patient management decisions. A negative result may occur with  improper specimen collection/handling, submission of specimen other than nasopharyngeal swab, presence of viral mutation(s) within the areas targeted by this assay, and inadequate number of viral copies(<138 copies/mL). A negative result must be combined with clinical observations, patient history, and epidemiological information. The expected result is Negative.  Fact Sheet for Patients:  EntrepreneurPulse.com.au  Fact Sheet for Healthcare Providers:  IncredibleEmployment.be  This  test is no t yet approved or cleared by the [place] FDA and  has been authorized for detection and/or diagnosis of SARS-CoV-2 by FDA under an Emergency Use Authorization (EUA). This EUA will remain  in effect (meaning this test can be used) for the duration of the COVID-19 declaration under Section 564(b)(1) of the Act, 21 U.S.C.section 360bbb-3(b)(1), unless the authorization is terminated  or revoked sooner.       Influenza A by PCR NEGATIVE NEGATIVE Final   Influenza B by PCR NEGATIVE NEGATIVE Final    Comment: (NOTE) The Xpert Xpress SARS-CoV-2/FLU/RSV plus assay is intended as an aid in the diagnosis of influenza from Nasopharyngeal swab specimens and should not be used as a sole basis for treatment. Nasal washings and aspirates are unacceptable for Xpert Xpress SARS-CoV-2/FLU/RSV testing.  Fact Sheet for Patients: EntrepreneurPulse.com.au  Fact Sheet for Healthcare Providers: IncredibleEmployment.be  This test is not yet approved or cleared by the [place] FDA and has been authorized for detection and/or diagnosis of SARS-CoV-2 by FDA under an Emergency Use Authorization (EUA). This EUA will remain in effect (meaning this test can be used) for the duration of the COVID-19 declaration under Section 564(b)(1) of the Act, 21 U.S.C. section 360bbb-3(b)(1), unless the authorization is terminated or revoked.  Performed at [facility], [place], [place] [address]   Respiratory (~20 pathogens) panel by PCR     Status: None   Collection Time: 09/22/21 11:30 AM   Specimen: Peritoneal Washings; Respiratory  Result Value Ref Range Status   Adenovirus NOT DETECTED NOT DETECTED Final   Coronavirus 229E NOT DETECTED NOT DETECTED Final    Comment: (NOTE) The Coronavirus on the Respiratory Panel, DOES NOT test for the novel  Coronavirus (2019 nCoV)    Coronavirus HKU1 NOT DETECTED NOT DETECTED Final    Coronavirus NL63 NOT DETECTED NOT DETECTED Final   Coronavirus OC43 NOT DETECTED NOT DETECTED Final   Metapneumovirus NOT DETECTED NOT DETECTED Final   Rhinovirus / Enterovirus NOT DETECTED NOT DETECTED Final   Influenza A NOT DETECTED NOT DETECTED Final   Influenza B NOT DETECTED NOT DETECTED Final   Parainfluenza Virus 1 NOT DETECTED NOT DETECTED Final   Parainfluenza Virus 2 NOT DETECTED NOT DETECTED Final   Parainfluenza Virus 3 NOT DETECTED NOT DETECTED Final   Parainfluenza Virus 4 NOT DETECTED NOT DETECTED Final   Respiratory Syncytial Virus NOT DETECTED NOT DETECTED Final   Bordetella pertussis NOT DETECTED NOT DETECTED Final   Bordetella Parapertussis NOT DETECTED NOT DETECTED Final   Chlamydophila  pneumoniae NOT DETECTED NOT DETECTED Final   Mycoplasma pneumoniae NOT DETECTED NOT DETECTED Final    Comment: Performed at [facility], [place] [address], [place], [place] [address]  Blood Culture (routine x 2)     Status: Abnormal   Collection Time: 09/22/21  1:32 PM   Specimen: BLOOD  Result Value Ref Range Status   Specimen Description   Final    BLOOD LEFT ANTECUBITAL Performed at [facility], [address] [place], [place] [address]    Special Requests   Final    BOTTLES DRAWN AEROBIC AND ANAEROBIC Blood Culture results may not be optimal due to an excessive volume of blood received in culture bottles Performed at [facility], [address] [place], [place] [address]    Culture  Setup Time   Final    GRAM POSITIVE COCCI ANAEROBIC BOTTLE ONLY CRITICAL VALUE NOTED.  VALUE IS CONSISTENT WITH PREVIOUSLY REPORTED AND CALLED VALUE.    Culture (A)  Final    STAPHYLOCOCCUS AUREUS SUSCEPTIBILITIES PERFORMED ON PREVIOUS CULTURE WITHIN THE LAST 5 DAYS. Performed at [facility], [place] [address], [place], [place] [address]    Report Status 09/27/2021 FINAL  Final  Blood Culture (routine x 2)     Status: Abnormal   Collection Time: 09/22/21  1:32  PM   Specimen: BLOOD  Result Value Ref Range Status   Specimen Description   Final    BLOOD LEFT ANTECUBITAL Performed at [facility], [address], [place], [place] [address]    Special Requests   Final    BOTTLES DRAWN AEROBIC AND ANAEROBIC Blood Culture adequate volume Performed at [facility], [address] [place], [place] [address]    Culture  Setup Time   Final    GRAM POSITIVE COCCI IN BOTH AEROBIC AND ANAEROBIC BOTTLES Organism ID to follow CRITICAL RESULT CALLED TO, READ BACK BY AND VERIFIED WITH: Ardeen Garland, PHARMD AT 0725 ON 09/23/21 BY GM Performed at [facility], [address] [place],  [address]    Culture METHICILLIN RESISTANT STAPHYLOCOCCUS AUREUS (A)  Final   Report Status 09/25/2021 FINAL  Final   Organism ID, Bacteria METHICILLIN RESISTANT STAPHYLOCOCCUS AUREUS  Final      Susceptibility   Methicillin resistant staphylococcus aureus - MIC*    CIPROFLOXACIN 2 INTERMEDIATE Intermediate     ERYTHROMYCIN >=8 RESISTANT Resistant     GENTAMICIN <=0.5 SENSITIVE Sensitive     OXACILLIN >=4 RESISTANT Resistant     TETRACYCLINE <=1 SENSITIVE Sensitive     VANCOMYCIN 1 SENSITIVE Sensitive     TRIMETH/SULFA <=10 SENSITIVE Sensitive     CLINDAMYCIN <=0.25 SENSITIVE Sensitive     RIFAMPIN <=0.5 SENSITIVE Sensitive     Inducible Clindamycin NEGATIVE Sensitive     * METHICILLIN RESISTANT STAPHYLOCOCCUS AUREUS  Blood Culture ID Panel (Reflexed)     Status: Abnormal   Collection Time: 09/22/21  1:32 PM  Result Value Ref Range Status   Enterococcus faecalis NOT DETECTED NOT DETECTED Final   Enterococcus Faecium NOT DETECTED NOT DETECTED Final   Listeria monocytogenes NOT DETECTED NOT DETECTED Final   Staphylococcus species DETECTED (A) NOT DETECTED Final    Comment: CRITICAL RESULT CALLED TO, READ BACK BY AND VERIFIED WITH: MORGAN HICKS, PHARMD AT 0725 ON 09/23/21 BY GM    Staphylococcus aureus (BCID) DETECTED (A) NOT  DETECTED Final    Comment: Methicillin (oxacillin)-resistant Staphylococcus aureus (MRSA). MRSA is predictably resistant to beta-lactam antibiotics (except  ceftaroline). Preferred therapy is vancomycin unless clinically contraindicated. Patient requires contact precautions if  hospitalized. CRITICAL RESULT CALLED TO, READ BACK BY AND VERIFIED WITH: MORGAN HICKS, PHARMD AT 0725 ON 09/23/21 BY GM    Staphylococcus epidermidis NOT DETECTED NOT DETECTED Final   Staphylococcus lugdunensis NOT DETECTED NOT DETECTED Final   Streptococcus species NOT DETECTED NOT DETECTED Final   Streptococcus agalactiae NOT DETECTED NOT DETECTED Final   Streptococcus pneumoniae NOT DETECTED NOT DETECTED Final   Streptococcus pyogenes NOT DETECTED NOT DETECTED Final   A.calcoaceticus-baumannii NOT DETECTED NOT DETECTED Final   Bacteroides fragilis NOT DETECTED NOT DETECTED Final   Enterobacterales NOT DETECTED NOT DETECTED Final   Enterobacter cloacae complex NOT DETECTED NOT DETECTED Final   Escherichia coli NOT DETECTED NOT DETECTED Final   Klebsiella aerogenes NOT DETECTED NOT DETECTED Final   Klebsiella oxytoca NOT DETECTED NOT DETECTED Final   Klebsiella pneumoniae NOT DETECTED NOT DETECTED Final   Proteus species NOT DETECTED NOT DETECTED Final   Salmonella species NOT DETECTED NOT DETECTED Final   Serratia marcescens NOT DETECTED NOT DETECTED Final   Haemophilus influenzae NOT DETECTED NOT DETECTED Final   Neisseria meningitidis NOT DETECTED NOT DETECTED Final   Pseudomonas aeruginosa NOT DETECTED NOT DETECTED Final   Stenotrophomonas maltophilia NOT DETECTED NOT DETECTED Final   Candida albicans NOT DETECTED NOT DETECTED Final   Candida auris NOT DETECTED NOT DETECTED Final   Candida glabrata NOT DETECTED NOT DETECTED Final   Candida krusei NOT DETECTED NOT DETECTED Final   Candida parapsilosis NOT DETECTED NOT DETECTED Final   Candida tropicalis NOT DETECTED NOT DETECTED Final   Cryptococcus  neoformans/gattii NOT DETECTED NOT DETECTED Final   Meth resistant mecA/C and MREJ DETECTED (A) NOT DETECTED Final    Comment: CRITICAL RESULT CALLED TO, READ BACK BY AND VERIFIED WITH: Ardeen Garland, PHARMD AT 0725 ON 09/23/21 BY GM Performed at [facility], [address] [place], [place] [address]   Group A Strep by PCR [facility] Only)     Status: None   Collection Time: 09/22/21  1:40 PM   Specimen: Throat; Sterile Swab  Result Value Ref Range Status   Group A Strep by PCR NOT DETECTED NOT DETECTED Final    Comment: Performed at [facility], [address] [place], [place] [address]  Peritoneal fluid culture w Gram Stain     Status: None   Collection Time: 09/22/21  7:29 PM   Specimen: Peritoneal Washings; Peritoneal Fluid  Result Value Ref Range Status   Specimen Description   Final    PERITONEAL Performed at [facility], [address] [place], [place] [address]    Special Requests   Final    NONE Performed at [facility], [address] [place], [place] [address]    Gram Stain   Final    FEW WBC PRESENT, PREDOMINANTLY MONONUCLEAR NO ORGANISMS SEEN    Culture   Final    NO GROWTH 3 DAYS Performed at [facility], [place] [address], [place], [place] [address]    Report Status 09/26/2021 FINAL  Final  Gastrointestinal Panel by PCR , Stool     Status: None   Collection Time: 09/23/21  4:45 AM   Specimen: Stool  Result Value Ref Range Status   Campylobacter species NOT DETECTED NOT DETECTED Final   Plesimonas shigelloides NOT DETECTED NOT DETECTED Final   Salmonella species NOT DETECTED NOT DETECTED Final   Yersinia enterocolitica NOT DETECTED NOT DETECTED Final  Vibrio species NOT DETECTED NOT DETECTED Final   Vibrio cholerae NOT DETECTED NOT DETECTED Final   Enteroaggregative E coli (EAEC) NOT DETECTED NOT DETECTED Final   Enteropathogenic E coli (EPEC) NOT DETECTED NOT DETECTED Final   Enterotoxigenic E coli (ETEC) NOT  DETECTED NOT DETECTED Final   Shiga like toxin producing E coli (STEC) NOT DETECTED NOT DETECTED Final   Shigella/Enteroinvasive E coli (EIEC) NOT DETECTED NOT DETECTED Final   Cryptosporidium NOT DETECTED NOT DETECTED Final   Cyclospora cayetanensis NOT DETECTED NOT DETECTED Final   Entamoeba histolytica NOT DETECTED NOT DETECTED Final   Giardia lamblia NOT DETECTED NOT DETECTED Final   Adenovirus F40/41 NOT DETECTED NOT DETECTED Final   Astrovirus NOT DETECTED NOT DETECTED Final   Norovirus GI/GII NOT DETECTED NOT DETECTED Final   Rotavirus A NOT DETECTED NOT DETECTED Final   Sapovirus (I, II, IV, and V) NOT DETECTED NOT DETECTED Final    Comment: Performed at [facility], [address] [place], [place] [address]  C Difficile Quick Screen w PCR reflex     Status: None   Collection Time: 09/23/21  4:45 AM   Specimen: Stool  Result Value Ref Range Status   C Diff antigen NEGATIVE NEGATIVE Final   C Diff toxin NEGATIVE NEGATIVE Final   C Diff interpretation No C. difficile detected.  Final    Comment: Performed at [facility], [address] [place], [place] [address]  CULTURE, BLOOD (ROUTINE X 2) w Reflex to ID Panel     Status: None (Preliminary result)   Collection Time: 09/25/21  6:12 AM   Specimen: BLOOD  Result Value Ref Range Status   Specimen Description BLOOD LEFT ARM  Final   Special Requests   Final    BOTTLES DRAWN AEROBIC AND ANAEROBIC Blood Culture adequate volume   Culture   Final    NO GROWTH 3 DAYS Performed at [facility], [address], [place], [place] [address]    Report Status PENDING  Incomplete  CULTURE, BLOOD (ROUTINE X 2) w Reflex to ID Panel     Status: None (Preliminary result)   Collection Time: 09/25/21  6:12 AM   Specimen: BLOOD  Result Value Ref Range Status   Specimen Description BLOOD RIGHT HAND  Final   Special Requests   Final    BOTTLES DRAWN AEROBIC AND ANAEROBIC Blood Culture adequate volume   Culture    Final    NO GROWTH 3 DAYS Performed at [facility], [address] [place], [place] [address]    Report Status PENDING  Incomplete  Aerobic/Anaerobic Culture w Gram Stain (surgical/deep wound)     Status: None (Preliminary result)   Collection Time: 09/26/21  2:58 PM   Specimen: Foot; Wound  Result Value Ref Range Status   Specimen Description   Final    FOOT LEFT Performed at [facility], [address], [place], [place] [address]    Special Requests   Final    NONE Performed at [facility], [place], [place] [address]    Gram Stain   Final    NO SQUAMOUS EPITHELIAL CELLS SEEN FEW WBC SEEN FEW GRAM POSITIVE COCCI Performed at [facility], [place] [address] [place], [place] [address]    Culture   Final    MODERATE METHICILLIN RESISTANT STAPHYLOCOCCUS AUREUS NO ANAEROBES ISOLATED; CULTURE IN PROGRESS FOR 5 DAYS    Report Status PENDING  Incomplete   Organism  ID, Bacteria METHICILLIN RESISTANT STAPHYLOCOCCUS AUREUS  Final      Susceptibility   Methicillin resistant staphylococcus aureus - MIC*    CIPROFLOXACIN 2 INTERMEDIATE Intermediate     ERYTHROMYCIN >=8 RESISTANT Resistant     GENTAMICIN <=0.5 SENSITIVE Sensitive     OXACILLIN >=4 RESISTANT Resistant     TETRACYCLINE <=1 SENSITIVE Sensitive     VANCOMYCIN 1 SENSITIVE Sensitive     TRIMETH/SULFA <=10 SENSITIVE Sensitive     CLINDAMYCIN <=0.25 SENSITIVE Sensitive     RIFAMPIN <=0.5 SENSITIVE Sensitive     Inducible Clindamycin NEGATIVE Sensitive     * MODERATE METHICILLIN RESISTANT STAPHYLOCOCCUS AUREUS  Surgical PCR screen     Status: Abnormal   Collection Time: 09/27/21  4:43 AM   Specimen: Nasal Mucosa; Nasal Swab  Result Value Ref Range Status   MRSA, PCR POSITIVE (A) NEGATIVE Final    Comment: RESULT CALLED TO, READ BACK BY AND VERIFIED WITH: CHARLOTTE KYEI AT 7893 ON 09/27/21 [place].    Staphylococcus aureus POSITIVE (A) NEGATIVE Final    Comment: (NOTE) The  Xpert SA Assay (FDA approved for NASAL specimens in patients 83 years of age and older), is one component of a comprehensive surveillance program. It is not intended to diagnose infection nor to guide or monitor treatment. Performed at [facility], [address] [place], [place] [address]   Aerobic/Anaerobic Culture w Gram Stain (surgical/deep wound)     Status: None (Preliminary result)   Collection Time: 09/27/21  9:17 AM   Specimen: PATH Digit amputation; Tissue  Result Value Ref Range Status   Specimen Description   Final    WOUND Performed at [facility], [address], [place], [place] [address]    Special Requests   Final    DIGIT Performed at [facility], [place], [place] [address]    Gram Stain   Final    NO SQUAMOUS EPITHELIAL CELLS SEEN FEW WBC SEEN FEW GRAM POSITIVE COCCI    Culture   Final    FEW STAPHYLOCOCCUS AUREUS SUSCEPTIBILITIES TO FOLLOW Performed at [facility], [place] [address] [place], [place] [address]    Report Status PENDING  Incomplete         Radiology Studies: CT ANGIO LOW EXTREM LEFT W &/OR WO CONTRAST  Result Date: 09/28/2021 CLINICAL DATA:  Reduction defect, lower limb Soft tissue mass, foot, spontaneous hemorrhage EXAM: CT OF THE LOWER LEFT EXTREMITY WITHOUT CONTRAST TECHNIQUE: Multidetector CT imaging of the lower left extremity was performed according to the standard protocol. COMPARISON:  Radiographs same day FINDINGS: Vascular: Left lower extremity Common iliac artery: Patent without significant stenosis or aneurysm. Scattered calcific atherosclerosis without significant stenosis. Internal iliac artery: Patent. External iliac artery: Patent without significant stenosis. Common femoral artery: Patent without significant stenosis. Profunda femoral artery: Patent without significant stenosis. Superficial femoral artery: Patent without significant stenosis. Scattered calcific  atherosclerosis without significant stenosis. Popliteal artery: Patent without significant stenosis or aneurysm. Tibioperoneal trunk: Patent without significant stenosis. Runoff vessels: Patent without significant stenosis. There is a patent three-vessel runoff to the level of the ankle, scattered calcific atherosclerotic disease without significant stenosis or occlusion appreciated. Nonvascular: Status post amputation of the first and second toes with foci of gas and scattered radiodensities, presumably postsurgical in nature. No drainable organized fluid collection. IMPRESSION: 1. The left lower extremity arteries are patent without significant stenosis. There is a three-vessel runoff to the level of the ankle. No findings of active  arterial extravasation or pseudoaneurysm associated with the surgical bed. 2. Status post amputation of the first and second toes with surgical defect containing foci of gas and antibiotic beads, presumably postsurgical although underlying infection can not be excluded. No drainable fluid collection. Electronically Signed   By: Albin Felling M.D.   On: 09/28/2021 08:45   MR FOOT LEFT WO CONTRAST  Result Date: 09/26/2021 CLINICAL DATA:  Osteomyelitis, foot left foot abscess, MRSA bacteremia EXAM: MRI OF THE LEFT FOOT WITHOUT CONTRAST TECHNIQUE: Multiplanar, multisequence MR imaging of the left foot was performed. No intravenous contrast was administered. COMPARISON:  X-ray 09/22/2021 FINDINGS: Bones/Joint/Cartilage Extensive bone marrow edema with low T1 signal changes involving the proximal and distal phalanx of the great toe. There is also involvement of the first metatarsal head and distal diaphysis. Serpiginous areas of T2 hyperintense signal within the first metatarsal diaphysis likely indicative of underlying bone infarct. Large complex first MTP joint effusion suggesting septic arthritis. Status post fifth ray resection at the level of the fifth metatarsal base. Preserved  signal within the residual fifth metatarsal base. Remaining osseous structures of the foot are within normal limits. No additional sites of bone marrow edema. Ligaments Intact Lisfranc ligament. No evidence of collateral ligament disruption. Muscles and Tendons Chronic denervation changes of the intrinsic foot musculature with probable superimposed myositis. Amputation changes at the lateral forefoot. Mild tenosynovitis involving the flexor and extensor tendons of the great toe distally. Soft tissues Soft tissue ulceration along the medial aspect of the great toe at the level of the first metatarsal head. Underlying contiguous fluid collection measures approximately 5.6 x 4.2 x 4.0 cm, which appears to be contiguous with the first MTP joint space. Diffuse soft tissue edema. IMPRESSION: 1. Soft tissue ulceration at the level of the first metatarsal head. Findings of acute osteomyelitis involving the proximal and distal phalanx of the great toe as well as the first metatarsal head and distal diaphysis. 2. Large complex first MTP joint effusion compatible with septic arthritis. 3. Soft tissue abscess adjacent to the first metatarsal head measuring up to 5.6 cm. 4. Mild tenosynovitis involving the flexor and extensor tendons of the great toe distally. 5. Status post fifth ray resection at the level of the fifth metatarsal base. Preserved signal within the residual fifth metatarsal base. Electronically Signed   By: Davina Poke D.O.   On: 09/26/2021 16:02   US ARTERIAL ABI (SCREENING LOWER EXTREMITY)  Result Date: 09/27/2021 CLINICAL DATA:  50 year old male with a history foot abscess EXAM: NONINVASIVE PHYSIOLOGIC VASCULAR STUDY OF BILATERAL LOWER EXTREMITIES TECHNIQUE: Evaluation of both lower extremities was performed at rest, including calculation of ankle-brachial indices, multiple segmental pressure evaluation, segmental Doppler and segmental pulse volume recording. COMPARISON:  None. FINDINGS: Right ABI:   Noncompressible Left ABI:  1.43 Right Lower Extremity: Segmental Doppler demonstrates triphasic waveforms at the right ankle. Left Lower Extremity: Segmental Doppler at the left ankle demonstrates monophasic posterior tibial artery and biphasic dorsalis pedis with decreased amplitude. IMPRESSION: Right: Resting ABI is noncompressible. Segmental exam demonstrates triphasic waveforms at the right ankle. Left: Resting ABI is likely falsely elevated secondary to noncompressible vessels. Segmental exam at the left ankle demonstrates evidence of developing more proximal occlusive disease Signed, Dulcy Fanny. Dellia Nims, RPVI [facility] Radiology Electronically Signed   By: Corrie Mckusick D.O.   On: 09/27/2021 08:30   DG Foot Complete Left  Result Date: 09/27/2021 CLINICAL DATA:  Post Op EXAM: LEFT FOOT - COMPLETE 3+ VIEW  COMPARISON:  Left foot radiographs 09/22/2021 FINDINGS: Status post amputation of the first digit at the level of the mid/proximal metatarsal. Prior amputation of the fifth digit. No acute osseous finding identified. Small densities within the soft tissues of the medial foot presumably postsurgical. Regional soft tissue swelling. IMPRESSION: Status post amputation of the first digit at the level of the mid/proximal metatarsal. Electronically Signed   By: Audie Pinto M.D.   On: 09/27/2021 10:29        Scheduled Meds:  sodium chloride   Intravenous Once   abacavir  300 mg Oral BID   carvedilol  12.5 mg Oral BID   Chlorhexidine Gluconate Cloth  6 each Topical Q0600   dolutegravir  50 mg Oral QHS   feeding supplement (NEPRO CARB STEADY)  237 mL Oral TID BM   gentamicin cream  1 application Topical Daily   heparin  5,000 Units Subcutaneous Q8H   insulin aspart  0-6 Units Subcutaneous TID WC   lamiVUDine  25 mg Oral QHS   multivitamin  1 tablet Oral Daily   mupirocin ointment  1 application Nasal BID   potassium chloride  20 mEq Oral  Daily   sodium chloride flush  3 mL Intravenous Q12H   Continuous Infusions:  sodium chloride     DAPTOmycin (CUBICIN)  IV     dialysis solution 1.5% low-MG/low-CA     dialysis solution 2.5% low-MG/low-CA       LOS: 4 days       Phillips Climes, MD [facility]   To contact the attending provider between 7A-7P or the covering provider during after hours 7P-7A, please log into the web site [email or web address] and access using universal [place] password for that web site. If you do not have the password, please call the hospital operator.  09/28/2021, 12:18 PM

## 2021-09-28 NOTE — Progress Notes (Signed)
Peritoneal Dialysis connected without complications to machine [phone].Access site care done.

## 2021-09-28 NOTE — Progress Notes (Signed)
PODIATRY / FOOT AND ANKLE SURGERY PROGRESS NOTE  Requesting Physician: Dr. Waldron Labs  Reason for consult: Left foot infection/bacteremia/sepsis  Chief Complaint: Left foot infection   HPI: Nathan Ayers is a 50 y.o. male who presents resting bed comfortably status post 1 day left partial first ray amputation with antibiotic bead application.  Patient notes minimal to no pain at this time to the left foot.  Patient is kept his dressings clean and intact and has not put weight on the foot since the surgery other than on his heel for transfers.  Patient states he has had no fevers overnight and he started to feel much better.  Patient did have a fair amount of bleeding during procedure and as result is getting a transfusion today due to hemoglobin this morning being 6.9, but patient has baseline chronic anemia.  PMHx:  Past Medical History:  Diagnosis Date   Anxiety    Chronic kidney disease (CKD), active medical management without dialysis    Mon, Wed and Fri   Chronic kidney disease on chronic dialysis ([place])    Diabetes mellitus ([place])    Diabetic neuropathy ([place])    Dyspnea    HIV infection ([place])    Hypertension     Surgical Hx:  Past Surgical History:  Procedure Laterality Date   AMPUTATION Left 09/27/2021   Procedure: PARTIAL AMPUTATION FIRST RAY WITH APPLICATION OF ANTIBIOTIC BEADS;  Surgeon: Caroline More, DPM;  Location: [facility];  Service: Podiatry;  Laterality: Left;   AMPUTATION TOE Left 02/04/2017   Procedure: AMPUTATION 5TH METATARSAL AND JOINT;  Surgeon: Samara Deist, DPM;  Location: [facility];  Service: Podiatry;  Laterality: Left;   APPLICATION OF WOUND VAC Left 05/20/2017   Procedure: APPLICATION OF WOUND VAC;  Surgeon: Samara Deist, DPM;  Location: [facility];  Service: Podiatry;  Laterality: Left;   AV FISTULA PLACEMENT     BONE EXCISION Left 02/17/2018   Procedure: BONE EXCISION METATARSAL;  Surgeon: Samara Deist, DPM;  Location: [facility];  Service: Podiatry;   Laterality: Left;   INCISION AND DRAINAGE Left 09/27/2021   Procedure: INCISION AND DRAINAGE;  Surgeon: Caroline More, DPM;  Location: [facility];  Service: Podiatry;  Laterality: Left;   INCISION AND DRAINAGE OF WOUND Left 05/20/2017   Procedure: IRRIGATION AND DEBRIDEMENT WOUND;  Surgeon: Samara Deist, DPM;  Location: [facility];  Service: Podiatry;  Laterality: Left;   TEE WITHOUT CARDIOVERSION N/A 09/24/2021   Procedure: TRANSESOPHAGEAL ECHOCARDIOGRAM (TEE);  Surgeon: [name], MD;  Location: [facility];  Service: Cardiovascular;  Laterality: N/A;    FHx:  Family History  Problem Relation Age of Onset   Diabetes Mother    Hypertension Mother     Social History:  reports that he has never smoked. He has never used smokeless tobacco. He reports that he does not drink alcohol and does not use drugs.  Allergies:  Allergies  Allergen Reactions   Lactose Intolerance (Gi)    Medications Prior to Admission  Medication Sig Dispense Refill   abacavir (ZIAGEN) 300 MG tablet Take 300 mg by mouth 2 (two) times daily.      amLODipine (NORVASC) 5 MG tablet Take 5 mg by mouth daily.     carvedilol (COREG) 25 MG tablet Take 25 mg by mouth 2 (two) times daily.   9   lamiVUDine (EPIVIR) 10 MG/ML solution Take 2.5 mLs by mouth at bedtime.   3   lanthanum (FOSRENOL) 1000 MG chewable tablet Chew 2,000 mg by mouth 3 (  three) times daily.     minoxidil (LONITEN) 2.5 MG tablet Take 2.5 mg by mouth See admin instructions. TAKES TWICE DAILY, EXCEPT FOR THE AM DOSE ON DIALYSIS DAYS; MON, WED, FRI  3   ondansetron (ZOFRAN-ODT) 4 MG disintegrating tablet Take 4 mg by mouth every 8 (eight) hours as needed for nausea or vomiting.      SANTYL ointment Apply 1 application topically daily. APPLIES TO FOOT USING GAUZE THEN WRAPS FOOT  2   TIVICAY 50 MG tablet Take 50 mg by mouth 2 (two) times daily.   3   cinacalcet (SENSIPAR) 30 MG tablet Take 30 mg by mouth daily.     losartan (COZAAR) 100 MG tablet Take 100  mg by mouth daily.     losartan (COZAAR) 100 MG tablet Take 100 mg by mouth 1 day or 1 dose.     Nutritional Supplements (FEEDING SUPPLEMENT, NEPRO CARB STEADY,) LIQD Take 237 mLs by mouth as needed (missed meal during dialysis.). 30 Can 0    Physical Exam: General: Alert and oriented.  No apparent distress.  Vascular: DP/PT pulses left difficult to palpate likely due to swelling, right palpable, capillary fill time appears to be intact to digits bilateral.  Moderate left lower extremity edema, nonpitting, mild right side.  No hair growth noted to digits.   Neuro: Light touch sensation absent to bilateral lower extremities.  Derm: Incision area to the left medial foot appears to be well coapted with sutures intact, no dehiscence today, no odor, no significant drainage, minimal serous drainage, reduced erythema and edema.    MSK: Left partial fifth and first ray amputations.  Results for orders placed or performed during the hospital encounter of 09/22/21 (from the past 48 hour(s))  Aerobic/Anaerobic Culture w Gram Stain (surgical/deep wound)     Status: None (Preliminary result)   Collection Time: 09/26/21  2:58 PM   Specimen: Foot; Wound  Result Value Ref Range   Specimen Description      FOOT LEFT Performed at [facility], [address] [place], [place] [address]    Special Requests      NONE Performed at [facility], [place], [place] [address]    Gram Stain      NO SQUAMOUS EPITHELIAL CELLS SEEN FEW WBC SEEN FEW GRAM POSITIVE COCCI Performed at [facility], [place] [address], [place], [place] [address]    Culture      MODERATE METHICILLIN RESISTANT STAPHYLOCOCCUS AUREUS NO ANAEROBES ISOLATED; CULTURE IN PROGRESS FOR 5 DAYS    Report Status PENDING    Organism ID, Bacteria METHICILLIN RESISTANT STAPHYLOCOCCUS AUREUS       Susceptibility   Methicillin resistant staphylococcus aureus - MIC*    CIPROFLOXACIN 2 INTERMEDIATE  Intermediate     ERYTHROMYCIN >=8 RESISTANT Resistant     GENTAMICIN <=0.5 SENSITIVE Sensitive     OXACILLIN >=4 RESISTANT Resistant     TETRACYCLINE <=1 SENSITIVE Sensitive     VANCOMYCIN 1 SENSITIVE Sensitive     TRIMETH/SULFA <=10 SENSITIVE Sensitive     CLINDAMYCIN <=0.25 SENSITIVE Sensitive     RIFAMPIN <=0.5 SENSITIVE Sensitive     Inducible Clindamycin NEGATIVE Sensitive     * MODERATE METHICILLIN RESISTANT STAPHYLOCOCCUS AUREUS  Glucose, capillary     Status: Abnormal   Collection Time: 09/26/21  4:27 PM  Result Value Ref Range   Glucose-Capillary 111 (H) 70 - 99 mg/dL    Comment: Glucose reference range applies only to samples  taken after fasting for at least 8 hours.  Glucose, capillary     Status: Abnormal   Collection Time: 09/26/21  5:24 PM  Result Value Ref Range   Glucose-Capillary 122 (H) 70 - 99 mg/dL    Comment: Glucose reference range applies only to samples taken after fasting for at least 8 hours.  Glucose, capillary     Status: Abnormal   Collection Time: 09/26/21  9:25 PM  Result Value Ref Range   Glucose-Capillary 117 (H) 70 - 99 mg/dL    Comment: Glucose reference range applies only to samples taken after fasting for at least 8 hours.  Surgical PCR screen     Status: Abnormal   Collection Time: 09/27/21  4:43 AM   Specimen: Nasal Mucosa; Nasal Swab  Result Value Ref Range   MRSA, PCR POSITIVE (A) NEGATIVE    Comment: RESULT CALLED TO, READ BACK BY AND VERIFIED WITH: CHARLOTTE KYEI AT 9357 ON 09/27/21 [place].    Staphylococcus aureus POSITIVE (A) NEGATIVE    Comment: (NOTE) The Xpert SA Assay (FDA approved for NASAL specimens in patients 40 years of age and older), is one component of a comprehensive surveillance program. It is not intended to diagnose infection nor to guide or monitor treatment. Performed at [facility], [address] [place], [place] [address]   Vancomycin, random     Status: None   Collection Time: 09/27/21  4:59 AM   Result Value Ref Range   Vancomycin Rm 20     Comment:        Random Vancomycin therapeutic range is dependent on dosage and time of specimen collection. A peak range is 20.0-40.0 ug/mL A trough range is 5.0-15.0 ug/mL        Performed at [facility], [address] [place], [place] [address]   CBC     Status: Abnormal   Collection Time: 09/27/21  4:59 AM  Result Value Ref Range   WBC 13.9 (H) 4.0 - 10.5 K/uL   RBC 2.75 (L) 4.22 - 5.81 MIL/uL   Hemoglobin 7.8 (L) 13.0 - 17.0 g/dL   HCT 22.9 (L) 39.0 - 52.0 %   MCV 83.3 80.0 - 100.0 fL   MCH 28.4 26.0 - 34.0 pg   MCHC 34.1 30.0 - 36.0 g/dL   RDW 13.8 11.5 - 15.5 %   Platelets 340 150 - 400 K/uL   nRBC 0.0 0.0 - 0.2 %    Comment: Performed at [facility], [address], [place], [place] [address]  Basic metabolic panel     Status: Abnormal   Collection Time: 09/27/21  4:59 AM  Result Value Ref Range   Sodium 133 (L) 135 - 145 mmol/L   Potassium 3.6 3.5 - 5.1 mmol/L   Chloride 94 (L) 98 - 111 mmol/L   CO2 23 22 - 32 mmol/L   Glucose, Bld 172 (H) 70 - 99 mg/dL    Comment: Glucose reference range applies only to samples taken after fasting for at least 8 hours.   BUN 88 (H) 6 - 20 mg/dL   Creatinine, Ser 18.37 (H) 0.61 - 1.24 mg/dL   Calcium 8.8 (L) 8.9 - 10.3 mg/dL   GFR, Estimated 3 (L) >60 mL/min    Comment: (NOTE) Calculated using the CKD-EPI Creatinine Equation (2021)    Anion gap 16 (H) 5 - 15    Comment: Performed at [facility], [address] [place], [place] [address]  Glucose, capillary  Status: Abnormal   Collection Time: 09/27/21  7:56 AM  Result Value Ref Range   Glucose-Capillary 117 (H) 70 - 99 mg/dL    Comment: Glucose reference range applies only to samples taken after fasting for at least 8 hours.  Aerobic/Anaerobic Culture w Gram Stain (surgical/deep wound)     Status: None (Preliminary result)   Collection Time: 09/27/21  9:17 AM   Specimen: PATH Digit  amputation; Tissue  Result Value Ref Range   Specimen Description      WOUND Performed at [facility], [address] [place], [place] [address]    Special Requests      DIGIT Performed at [facility], [place], [place] [address]    Gram Stain      NO SQUAMOUS EPITHELIAL CELLS SEEN FEW WBC SEEN FEW GRAM POSITIVE COCCI    Culture      FEW STAPHYLOCOCCUS AUREUS SUSCEPTIBILITIES TO FOLLOW Performed at [facility], [place] [address], [place], [place] [address]    Report Status PENDING   Glucose, capillary     Status: Abnormal   Collection Time: 09/27/21 10:03 AM  Result Value Ref Range   Glucose-Capillary 110 (H) 70 - 99 mg/dL    Comment: Glucose reference range applies only to samples taken after fasting for at least 8 hours.  Glucose, capillary     Status: Abnormal   Collection Time: 09/27/21 11:22 AM  Result Value Ref Range   Glucose-Capillary 112 (H) 70 - 99 mg/dL    Comment: Glucose reference range applies only to samples taken after fasting for at least 8 hours.  Glucose, capillary     Status: Abnormal   Collection Time: 09/27/21  4:56 PM  Result Value Ref Range   Glucose-Capillary 140 (H) 70 - 99 mg/dL    Comment: Glucose reference range applies only to samples taken after fasting for at least 8 hours.  Glucose, capillary     Status: Abnormal   Collection Time: 09/27/21  8:57 PM  Result Value Ref Range   Glucose-Capillary 173 (H) 70 - 99 mg/dL    Comment: Glucose reference range applies only to samples taken after fasting for at least 8 hours.  Vancomycin, random     Status: None   Collection Time: 09/28/21  4:11 AM  Result Value Ref Range   Vancomycin Rm 18     Comment:        Random Vancomycin therapeutic range is dependent on dosage and time of specimen collection. A peak range is 20.0-40.0 ug/mL A trough range is 5.0-15.0 ug/mL        Performed at [facility], [address] [place], [place] [address]    CBC     Status: Abnormal   Collection Time: 09/28/21  4:11 AM  Result Value Ref Range   WBC 11.1 (H) 4.0 - 10.5 K/uL   RBC 2.37 (L) 4.22 - 5.81 MIL/uL   Hemoglobin 6.9 (L) 13.0 - 17.0 g/dL   HCT 19.9 (L) 39.0 - 52.0 %   MCV 84.0 80.0 - 100.0 fL   MCH 29.1 26.0 - 34.0 pg   MCHC 34.7 30.0 - 36.0 g/dL   RDW 13.9 11.5 - 15.5 %   Platelets 303 150 - 400 K/uL   nRBC 0.0 0.0 - 0.2 %    Comment: Performed at [facility], [address], [place],  [address]  Basic metabolic panel     Status: Abnormal   Collection Time:  09/28/21  4:11 AM  Result Value Ref Range   Sodium 131 (L) 135 - 145 mmol/L   Potassium 3.9 3.5 - 5.1 mmol/L   Chloride 94 (L) 98 - 111 mmol/L   CO2 26 22 - 32 mmol/L   Glucose, Bld 120 (H) 70 - 99 mg/dL    Comment: Glucose reference range applies only to samples taken after fasting for at least 8 hours.   BUN 75 (H) 6 - 20 mg/dL   Creatinine, Ser 17.06 (H) 0.61 - 1.24 mg/dL   Calcium 8.4 (L) 8.9 - 10.3 mg/dL   GFR, Estimated 3 (L) >60 mL/min    Comment: (NOTE) Calculated using the CKD-EPI Creatinine Equation (2021)    Anion gap 11 5 - 15    Comment: Performed at [facility], [address] [place], [place] [address]  CK     Status: None   Collection Time: 09/28/21  4:11 AM  Result Value Ref Range   Total CK 70 49 - 397 U/L    Comment: Performed at [facility], [address] [place], [place] [address]  Type and screen [place]     Status: None (Preliminary result)   Collection Time: 09/28/21  7:30 AM  Result Value Ref Range   ABO/RH(D) B POS    Antibody Screen NEG    Sample Expiration 10/01/2021,2359    Unit Number [id number]    Blood Component Type RED CELLS,LR    Unit division 00    Status of Unit ISSUED    Transfusion Status OK TO TRANSFUSE    Crossmatch Result      Compatible Performed at [facility], [address] [place], [place] [address]   Glucose, capillary      Status: Abnormal   Collection Time: 09/28/21  8:24 AM  Result Value Ref Range   Glucose-Capillary 110 (H) 70 - 99 mg/dL    Comment: Glucose reference range applies only to samples taken after fasting for at least 8 hours.  Prepare RBC (crossmatch)     Status: None   Collection Time: 09/28/21  8:44 AM  Result Value Ref Range   Order Confirmation      ORDER PROCESSED BY BLOOD BANK Performed at [facility], [address] [place],  [address]   Glucose, capillary     Status: Abnormal   Collection Time: 09/28/21 11:43 AM  Result Value Ref Range   Glucose-Capillary 120 (H) 70 - 99 mg/dL    Comment: Glucose reference range applies only to samples taken after fasting for at least 8 hours.   Comment 1 Notify RN    Comment 2 Document in Chart    CT ANGIO LOW EXTREM LEFT W &/OR WO CONTRAST  Result Date: 09/28/2021 CLINICAL DATA:  Reduction defect, lower limb Soft tissue mass, foot, spontaneous hemorrhage EXAM: CT OF THE LOWER LEFT EXTREMITY WITHOUT CONTRAST TECHNIQUE: Multidetector CT imaging of the lower left extremity was performed according to the standard protocol. COMPARISON:  Radiographs same day FINDINGS: Vascular: Left lower extremity Common iliac artery: Patent without significant stenosis or aneurysm. Scattered calcific atherosclerosis without significant stenosis. Internal iliac artery: Patent. External iliac artery: Patent without significant stenosis. Common femoral artery: Patent without significant stenosis. Profunda femoral artery: Patent without significant stenosis. Superficial femoral artery: Patent without significant stenosis. Scattered calcific atherosclerosis without significant stenosis. Popliteal artery: Patent without significant stenosis or aneurysm. Tibioperoneal trunk: Patent without significant stenosis. Runoff vessels: Patent without significant stenosis. There is a  patent three-vessel runoff to the level of the ankle, scattered calcific atherosclerotic  disease without significant stenosis or occlusion appreciated. Nonvascular: Status post amputation of the first and second toes with foci of gas and scattered radiodensities, presumably postsurgical in nature. No drainable organized fluid collection. IMPRESSION: 1. The left lower extremity arteries are patent without significant stenosis. There is a three-vessel runoff to the level of the ankle. No findings of active arterial extravasation or pseudoaneurysm associated with the surgical bed. 2. Status post amputation of the first and second toes with surgical defect containing foci of gas and antibiotic beads, presumably postsurgical although underlying infection can not be excluded. No drainable fluid collection. Electronically Signed   By: Albin Felling M.D.   On: 09/28/2021 08:45   MR FOOT LEFT WO CONTRAST  Result Date: 09/26/2021 CLINICAL DATA:  Osteomyelitis, foot left foot abscess, MRSA bacteremia EXAM: MRI OF THE LEFT FOOT WITHOUT CONTRAST TECHNIQUE: Multiplanar, multisequence MR imaging of the left foot was performed. No intravenous contrast was administered. COMPARISON:  X-ray 09/22/2021 FINDINGS: Bones/Joint/Cartilage Extensive bone marrow edema with low T1 signal changes involving the proximal and distal phalanx of the great toe. There is also involvement of the first metatarsal head and distal diaphysis. Serpiginous areas of T2 hyperintense signal within the first metatarsal diaphysis likely indicative of underlying bone infarct. Large complex first MTP joint effusion suggesting septic arthritis. Status post fifth ray resection at the level of the fifth metatarsal base. Preserved signal within the residual fifth metatarsal base. Remaining osseous structures of the foot are within normal limits. No additional sites of bone marrow edema. Ligaments Intact Lisfranc ligament. No evidence of collateral ligament disruption. Muscles and Tendons Chronic denervation changes of the intrinsic foot musculature  with probable superimposed myositis. Amputation changes at the lateral forefoot. Mild tenosynovitis involving the flexor and extensor tendons of the great toe distally. Soft tissues Soft tissue ulceration along the medial aspect of the great toe at the level of the first metatarsal head. Underlying contiguous fluid collection measures approximately 5.6 x 4.2 x 4.0 cm, which appears to be contiguous with the first MTP joint space. Diffuse soft tissue edema. IMPRESSION: 1. Soft tissue ulceration at the level of the first metatarsal head. Findings of acute osteomyelitis involving the proximal and distal phalanx of the great toe as well as the first metatarsal head and distal diaphysis. 2. Large complex first MTP joint effusion compatible with septic arthritis. 3. Soft tissue abscess adjacent to the first metatarsal head measuring up to 5.6 cm. 4. Mild tenosynovitis involving the flexor and extensor tendons of the great toe distally. 5. Status post fifth ray resection at the level of the fifth metatarsal base. Preserved signal within the residual fifth metatarsal base. Electronically Signed   By: Davina Poke D.O.   On: 09/26/2021 16:02   DG Foot Complete Left  Result Date: 09/27/2021 CLINICAL DATA:  Post Op EXAM: LEFT FOOT - COMPLETE 3+ VIEW COMPARISON:  Left foot radiographs 09/22/2021 FINDINGS: Status post amputation of the first digit at the level of the mid/proximal metatarsal. Prior amputation of the fifth digit. No acute osseous finding identified. Small densities within the soft tissues of the medial foot presumably postsurgical. Regional soft tissue swelling. IMPRESSION: Status post amputation of the first digit at the level of the mid/proximal metatarsal. Electronically Signed   By: Audie Pinto M.D.   On: 09/27/2021 10:29    Blood pressure (!) 142/66, pulse 79, temperature 98.1 F (36.7 C), temperature source Oral, resp. rate 18,  height 6' 6"  (1.981 m), weight 104.3 kg, SpO2 100  %.   Assessment Bacteremia/sepsis secondary to septic joint left first metatarsal phalangeal joint with associated abscess and cellulitis Diabetes type 1 with polyneuropathy PVD HIV Anemia  Plan -Patient seen and examined. -Postop x-ray imaging reviewed. -Dressing removed.  Remove packing gauze today. -Applied Xeroform to the incision line followed by 4 x 4 gauze, ABD, Kerlix, Ace wrap.  No signs of dehiscence present today, reduce signs of infection overall, appears to be improving. -Bone culture taken, pending.  Concern for possible contamination due to placement in nonsterile container per OR nursing.  Path pending. -Blood culture grew MRSA upon admission.  Appreciate infectious disease recommendations.  Believe it is likely that all infection from the foot process was removed with amputation. -Discussed that patient is still at high risk for limb loss.  Patient will likely a pressure issues when ambulating which could cause another forefoot ulceration to occur.  If this is the case and it does occur patient may require transmetatarsal amputation in the future. -Appreciate medicine recommendations for anemia treatment.  No active bleeding noted today upon bandaging. -Appreciate vascular recommendations. -We will likely change dressing 1 more time tomorrow and likely sign off if things are looking well.  Caroline More, DPM 09/28/2021, 1:30 PM

## 2021-09-28 NOTE — Progress Notes (Signed)
Date of Admission:  09/22/2021    ID: Nathan Ayers is a 50 y.o. male with   Principal Problem:   Leukocytosis Active Problems:   Malnutrition of moderate degree   Type 1 diabetes ([place])   Essential hypertension   HIV disease ([place])   ESRD on dialysis ([place])   OSA (obstructive sleep apnea)    Subjective: Over the weekend there was pus discharging from the left foot- there was no abscess or wound noted before that. HE has h/o amputation 5th toe HE was taken for surgery yesterday and underwent amputation first toe He feels better  No fever X 48 hrs  Medications:   sodium chloride   Intravenous Once   abacavir  300 mg Oral BID   carvedilol  12.5 mg Oral BID   Chlorhexidine Gluconate Cloth  6 each Topical Q0600   dolutegravir  50 mg Oral QHS   feeding supplement (NEPRO CARB STEADY)  237 mL Oral TID BM   gentamicin cream  1 application Topical Daily   heparin  5,000 Units Subcutaneous Q8H   insulin aspart  0-6 Units Subcutaneous TID WC   lamiVUDine  25 mg Oral QHS   multivitamin  1 tablet Oral Daily   mupirocin ointment  1 application Nasal BID   potassium chloride  20 mEq Oral Daily   sodium chloride flush  3 mL Intravenous Q12H   vancomycin variable dose per unstable renal function (pharmacist dosing)   Does not apply See admin instructions    Objective: Vital signs in last 24 hours: Temp:  [97.5 F (36.4 C)-98.5 F (36.9 C)] 98.5 F (36.9 C) (11/21 0338) Pulse Rate:  [70-80] 80 (11/21 0338) Resp:  [13-18] 16 (11/21 0338) BP: (99-154)/(54-69) 154/57 (11/21 0338) SpO2:  [97 %-100 %] 99 % (11/21 0338)  PHYSICAL EXAM:  General: Alert, cooperative, no distress, appears stated age.  Head: Normocephalic, without obvious abnormality, atraumatic. Eyes: Conjunctivae clear, anicteric sclerae. Pupils are equal ENT Nares normal. No drainage or sinus tenderness. Lips, mucosa, and tongue normal. No Thrush Neck: Supple, symmetrical, no adenopathy, thyroid: non tender no  carotid bruit and no JVD. Back: No CVA tenderness. Lungs: Clear to auscultation bilaterally. No Wheezing or Rhonchi. No rales. Heart: Regular rate and rhythm, no murmur, rub or gallop. Abdomen: Soft, non-tender,not distended. Bowel sounds normal. No masses Extremities: left foot dressing not removed Picture reviewed Amputation site looks good   Skin: No rashes or lesions. Or bruising Lymph: Cervical, supraclavicular normal. Neurologic: Grossly non-focal  Lab Results Recent Labs    09/27/21 0459 09/28/21 0411  WBC 13.9* 11.1*  HGB 7.8* 6.9*  HCT 22.9* 19.9*  NA 133* 131*  K 3.6 3.9  CL 94* 94*  CO2 23 26  BUN 88* 75*  CREATININE 18.37* 17.06*   Microbiology:  Studies/Results: CT ANGIO LOW EXTREM LEFT W &/OR WO CONTRAST  Result Date: 09/28/2021 CLINICAL DATA:  Reduction defect, lower limb Soft tissue mass, foot, spontaneous hemorrhage EXAM: CT OF THE LOWER LEFT EXTREMITY WITHOUT CONTRAST TECHNIQUE: Multidetector CT imaging of the lower left extremity was performed according to the standard protocol. COMPARISON:  Radiographs same day FINDINGS: Vascular: Left lower extremity Common iliac artery: Patent without significant stenosis or aneurysm. Scattered calcific atherosclerosis without significant stenosis. Internal iliac artery: Patent. External iliac artery: Patent without significant stenosis. Common femoral artery: Patent without significant stenosis. Profunda femoral artery: Patent without significant stenosis. Superficial femoral artery: Patent without significant stenosis. Scattered calcific atherosclerosis without significant stenosis. Popliteal artery: Patent without  significant stenosis or aneurysm. Tibioperoneal trunk: Patent without significant stenosis. Runoff vessels: Patent without significant stenosis. There is a patent three-vessel runoff to the level of the ankle, scattered calcific atherosclerotic disease without significant stenosis or occlusion appreciated.  Nonvascular: Status post amputation of the first and second toes with foci of gas and scattered radiodensities, presumably postsurgical in nature. No drainable organized fluid collection. IMPRESSION: 1. The left lower extremity arteries are patent without significant stenosis. There is a three-vessel runoff to the level of the ankle. No findings of active arterial extravasation or pseudoaneurysm associated with the surgical bed. 2. Status post amputation of the first and second toes with surgical defect containing foci of gas and antibiotic beads, presumably postsurgical although underlying infection can not be excluded. No drainable fluid collection. Electronically Signed   By: Albin Felling M.D.   On: 09/28/2021 08:45   MR FOOT LEFT WO CONTRAST  Result Date: 09/26/2021 CLINICAL DATA:  Osteomyelitis, foot left foot abscess, MRSA bacteremia EXAM: MRI OF THE LEFT FOOT WITHOUT CONTRAST TECHNIQUE: Multiplanar, multisequence MR imaging of the left foot was performed. No intravenous contrast was administered. COMPARISON:  X-ray 09/22/2021 FINDINGS: Bones/Joint/Cartilage Extensive bone marrow edema with low T1 signal changes involving the proximal and distal phalanx of the great toe. There is also involvement of the first metatarsal head and distal diaphysis. Serpiginous areas of T2 hyperintense signal within the first metatarsal diaphysis likely indicative of underlying bone infarct. Large complex first MTP joint effusion suggesting septic arthritis. Status post fifth ray resection at the level of the fifth metatarsal base. Preserved signal within the residual fifth metatarsal base. Remaining osseous structures of the foot are within normal limits. No additional sites of bone marrow edema. Ligaments Intact Lisfranc ligament. No evidence of collateral ligament disruption. Muscles and Tendons Chronic denervation changes of the intrinsic foot musculature with probable superimposed myositis. Amputation changes at the  lateral forefoot. Mild tenosynovitis involving the flexor and extensor tendons of the great toe distally. Soft tissues Soft tissue ulceration along the medial aspect of the great toe at the level of the first metatarsal head. Underlying contiguous fluid collection measures approximately 5.6 x 4.2 x 4.0 cm, which appears to be contiguous with the first MTP joint space. Diffuse soft tissue edema. IMPRESSION: 1. Soft tissue ulceration at the level of the first metatarsal head. Findings of acute osteomyelitis involving the proximal and distal phalanx of the great toe as well as the first metatarsal head and distal diaphysis. 2. Large complex first MTP joint effusion compatible with septic arthritis. 3. Soft tissue abscess adjacent to the first metatarsal head measuring up to 5.6 cm. 4. Mild tenosynovitis involving the flexor and extensor tendons of the great toe distally. 5. Status post fifth ray resection at the level of the fifth metatarsal base. Preserved signal within the residual fifth metatarsal base. Electronically Signed   By: Davina Poke D.O.   On: 09/26/2021 16:02   US ARTERIAL ABI (SCREENING LOWER EXTREMITY)  Result Date: 09/27/2021 CLINICAL DATA:  50 year old male with a history foot abscess EXAM: NONINVASIVE PHYSIOLOGIC VASCULAR STUDY OF BILATERAL LOWER EXTREMITIES TECHNIQUE: Evaluation of both lower extremities was performed at rest, including calculation of ankle-brachial indices, multiple segmental pressure evaluation, segmental Doppler and segmental pulse volume recording. COMPARISON:  None. FINDINGS: Right ABI:  Noncompressible Left ABI:  1.43 Right Lower Extremity: Segmental Doppler demonstrates triphasic waveforms at the right ankle. Left Lower Extremity: Segmental Doppler at the left ankle demonstrates monophasic posterior tibial artery and biphasic dorsalis pedis with  decreased amplitude. IMPRESSION: Right: Resting ABI is noncompressible. Segmental exam demonstrates triphasic waveforms at  the right ankle. Left: Resting ABI is likely falsely elevated secondary to noncompressible vessels. Segmental exam at the left ankle demonstrates evidence of developing more proximal occlusive disease Signed, Dulcy Fanny. Dellia Nims, RPVI [facility] Electronically Signed   By: Corrie Mckusick D.O.   On: 09/27/2021 08:30   DG Foot Complete Left  Result Date: 09/27/2021 CLINICAL DATA:  Post Op EXAM: LEFT FOOT - COMPLETE 3+ VIEW COMPARISON:  Left foot radiographs 09/22/2021 FINDINGS: Status post amputation of the first digit at the level of the mid/proximal metatarsal. Prior amputation of the fifth digit. No acute osseous finding identified. Small densities within the soft tissues of the medial foot presumably postsurgical. Regional soft tissue swelling. IMPRESSION: Status post amputation of the first digit at the level of the mid/proximal metatarsal. Electronically Signed   By: Audie Pinto M.D.   On: 09/27/2021 10:29     Assessment/Plan:  MRSA bacteremia.  The source was not clear until 11/19 when the left foot had purulent discharge Then an MRI was done and that showed a 5.6 into 4.2 into 4 cm collection at the site of the first metatarsal head involving the first MTP joint.  He was taken for surgery and underwent left great toe amputation. TEE is negative He is currently on vancomycin. Will change to daptomycin for ease of administration as OP- Will need for 4 weeks..  Leukocytosis is resolved  [facility]  On abacavir, dolutegravir and Epivir.  CD4 count is more than 300 and undetectable viral load  Hypertension on amlodipine, carvedilol and losartan.  End-stage renal disease on peritoneal dialysis.  Peritoneal fluid no evidence of infection.  Diabetes mellitus on insulin.  Discussed the management with patient and care team

## 2021-09-28 NOTE — Progress Notes (Signed)
Pharmacy Antibiotic Note  Nathan Ayers is a 49 y.o. male admitted on 09/22/2021 with MRSA bacteremia.  Pharmacy has been consulted for vancomycin to daptomycin dosing. PMH includes ESRD on CCPD (9 hour dwell, 5 exchanges, 2.7 L fills) 7d/wk, HIV (CD4 302).  Recent URI 11/11 treated with PCN. Now with positive nausea, vomiting, fever, and positive blood cx growing MRSA. Fluid negative for peritonitis and no infection at catheter site. Diarrhea with negative GI panel. TEE negative.On 11/18, found to have purulent drainage from anterior left foot which is new and developed overnight. Undergoing I&D today 11/20. TMAX in past 24 hours is 100.5. Last PD exchange 11/20 AM. Given vancomycin 1,000 mg 11/15 in PM and vancomycin 1,500 mg 11/16 in PM.   11/18 vancomycin random level: 27  11/20 vancomycin random level: 20 11/21: vancomycin random level: 18  Per ID plan is to change to daptomycin with plan for administration at home. Patient had I&D and partial 1st ray amputation 11/20  Plan: Daptomycin 9m/kg (8077m q48h adjusted for peritoneal dialysis (CrCl < 3042min) Check CK now then at least weekly and monitor for myopathy Follow-up possibility of discharging on OPAT  Follow nephrology plans and clinical course.   Height: 6' 6"  (198.1 cm) Weight: 104.3 kg (230 lb) IBW/kg (Calculated) : 91.4  Temp (24hrs), Avg:98.2 F (36.8 C), Min:98 F (36.7 C), Max:98.5 F (36.9 C)  Recent Labs  Lab 09/22/21 1332 09/23/21 0445 09/23/21 0643 09/24/21 0552 09/25/21 0612 09/26/21 0455 09/27/21 0459 09/28/21 0411  WBC 25.9* 29.9* 25.7* 21.4*  --  18.1* 13.9* 11.1*  CREATININE 23.91* 24.45*  --  20.61*  --  18.71* 18.37* 17.06*  LATICACIDVEN 1.1  --   --   --   --   --   --   --   VANCORANDOM  --   --   --   --    < >  --  20 18   < > = values in this interval not displayed.     Estimated Creatinine Clearance: 6.8 mL/min (A) (by C-G formula based on SCr of 17.06 mg/dL (H)).    Allergies   Allergen Reactions   Lactose Intolerance (Gi)     Antimicrobials this admission: 11/21 daptomycin >> 11/15 vanc >> 11/21 11/15 cefepime >> 11/15 11/15 metronidazole >> 11/15  Dose adjustments this admission: N/a   Microbiology results: 11/15 BCx: MRSA 3/4 11/15 Peritoneal fluid: few WBC, NG x 3 d 11/16 C. Diff toxin: negative 11/15 Group A strep PCR: not detected 11/16 CD4: 302 11/18 Bcx: NG x 2 days 11/19 Left foot: MRSA 11/20 Wound: S. aureus  Thank you for allowing pharmacy to be a part of this patient's care.  DusDoreene ElandharmD, BCPS, BCIDP Work Cell: [phone]/21/2022 11:16 AM

## 2021-09-28 NOTE — Consult Note (Signed)
Chief Complaint: Patient was seen in consultation today for bacteremia with osteomyelitis and need for long term antibiotic therapy at the request of Elgergawy, Silver Huguenin, MD  Referring Physician(s): Elgergawy, Silver Huguenin, MD  Supervising Physician: Juliet Rude  Patient Status: [facility] - In-pt  History of Present Illness: Nathan Ayers is a 50 y.o. male who presented to ED 11/15 with URI symptoms, fever and overall not feeling well. He is now admitted for MRSA sepsis secondary to left foot abscess with septic first metatarsal phalangeal joint with osteomyelitis s/p left partial first ray amputation and application of antibiotic beads on 11/20 by Podiatry. Repeat Blood Cx 11/18 no growth and overall clinically improving.  Patient with known PMHx of ESRD on peritoneal dialysis, history of previously placed right IJ HD catheter and right upper extremity AVF. IR received request for image guided IJ tunneled central venous catheter for outpatient long term antibiotic therapy.   The patient denies any current chest pain or shortness of breath. He has no known complications to sedation.    Past Medical History:  Diagnosis Date   Anxiety    Chronic kidney disease (CKD), active medical management without dialysis    Mon, Wed and Fri   Chronic kidney disease on chronic dialysis ([place])    Diabetes mellitus ([place])    Diabetic neuropathy ([place])    Dyspnea    HIV infection ([place])    Hypertension     Past Surgical History:  Procedure Laterality Date   AMPUTATION Left 09/27/2021   Procedure: PARTIAL AMPUTATION FIRST RAY WITH APPLICATION OF ANTIBIOTIC BEADS;  Surgeon: Caroline More, DPM;  Location: [facility] ORS;  Service: Podiatry;  Laterality: Left;   AMPUTATION TOE Left 02/04/2017   Procedure: AMPUTATION 5TH METATARSAL AND JOINT;  Surgeon: Samara Deist, DPM;  Location: [facility];  Service: Podiatry;  Laterality: Left;   APPLICATION OF WOUND VAC Left 05/20/2017   Procedure: APPLICATION OF WOUND  VAC;  Surgeon: Samara Deist, DPM;  Location: [facility];  Service: Podiatry;  Laterality: Left;   AV FISTULA PLACEMENT     BONE EXCISION Left 02/17/2018   Procedure: BONE EXCISION METATARSAL;  Surgeon: Samara Deist, DPM;  Location: [facility];  Service: Podiatry;  Laterality: Left;   INCISION AND DRAINAGE Left 09/27/2021   Procedure: INCISION AND DRAINAGE;  Surgeon: Caroline More, DPM;  Location: [facility];  Service: Podiatry;  Laterality: Left;   INCISION AND DRAINAGE OF WOUND Left 05/20/2017   Procedure: IRRIGATION AND DEBRIDEMENT WOUND;  Surgeon: Samara Deist, DPM;  Location: [facility];  Service: Podiatry;  Laterality: Left;   TEE WITHOUT CARDIOVERSION N/A 09/24/2021   Procedure: TRANSESOPHAGEAL ECHOCARDIOGRAM (TEE);  Surgeon: [name], MD;  Location: [facility];  Service: Cardiovascular;  Laterality: N/A;    Allergies: Lactose intolerance (gi)  Medications: Prior to Admission medications   Medication Sig Start Date End Date Taking? Authorizing Provider  abacavir (ZIAGEN) 300 MG tablet Take 300 mg by mouth 2 (two) times daily.  11/12/16  Yes [provider]  amLODipine (NORVASC) 5 MG tablet Take 5 mg by mouth daily.   Yes [provider]  carvedilol (COREG) 25 MG tablet Take 25 mg by mouth 2 (two) times daily.  01/02/17  Yes [provider]  lamiVUDine (EPIVIR) 10 MG/ML solution Take 2.5 mLs by mouth at bedtime.  01/02/17  Yes [provider]  lanthanum (FOSRENOL) 1000 MG chewable tablet Chew 2,000 mg by mouth 3 (three) times daily. 08/28/21  Yes [provider]  minoxidil (  LONITEN) 2.5 MG tablet Take 2.5 mg by mouth See admin instructions. TAKES TWICE DAILY, EXCEPT FOR THE AM DOSE ON DIALYSIS DAYS; MON, WED, FRI 01/23/17  Yes [provider]  ondansetron (ZOFRAN-ODT) 4 MG disintegrating tablet Take 4 mg by mouth every 8 (eight) hours as needed for nausea or vomiting.  02/24/16  Yes [provider]  SANTYL ointment Apply 1  application topically daily. APPLIES TO FOOT USING GAUZE THEN WRAPS FOOT 04/26/17  Yes [provider]  TIVICAY 50 MG tablet Take 50 mg by mouth 2 (two) times daily.  01/02/17  Yes [provider]  cinacalcet (SENSIPAR) 30 MG tablet Take 30 mg by mouth daily. 08/03/21   [provider]  losartan (COZAAR) 100 MG tablet Take 100 mg by mouth daily. 06/28/16 06/28/17  [provider]  losartan (COZAAR) 100 MG tablet Take 100 mg by mouth 1 day or 1 dose. 06/28/16 09/27/18  [provider]  Nutritional Supplements (FEEDING SUPPLEMENT, NEPRO CARB STEADY,) LIQD Take 237 mLs by mouth as needed (missed meal during dialysis.). 02/08/17   Epifanio Lesches, MD     Family History  Problem Relation Age of Onset   Diabetes Mother    Hypertension Mother     Social History   Socioeconomic History   Marital status: Single    Spouse name: Not on file   Number of children: Not on file   Years of education: Not on file   Highest education level: Not on file  Occupational History   Not on file  Tobacco Use   Smoking status: Never   Smokeless tobacco: Never  Vaping Use   Vaping Use: Never used  Substance and Sexual Activity   Alcohol use: No   Drug use: No   Sexual activity: Not Currently    Partners: Male    Birth control/protection: Condom  Other Topics Concern   Not on file  Social History Narrative   Not on file   Social Determinants of Health   Financial Resource Strain: Not on file  Food Insecurity: Not on file  Transportation Needs: Not on file  Physical Activity: Not on file  Stress: Not on file  Social Connections: Not on file   Review of Systems: A 12 point ROS discussed and pertinent positives are indicated in the HPI above.  All other systems are negative.  Review of Systems  Vital Signs: BP (!) 142/66   Pulse 79   Temp 98.1 F (36.7 C) (Oral)   Resp 18   Ht 6' 6"  (1.981 m)   Wt 230 lb (104.3 kg)   SpO2 100%   BMI 26.58 kg/m    Physical Exam Constitutional:      Appearance: Normal appearance.  HENT:     Head: Normocephalic and atraumatic.  Cardiovascular:     Rate and Rhythm: Normal rate.  Pulmonary:     Effort: Pulmonary effort is normal. No respiratory distress.  Neurological:     Mental Status: He is alert and oriented to person, place, and time.  Extremities: Left foot dressing intact, right upper extremity AVF  Imaging: CT Abdomen Pelvis Wo Contrast  Result Date: 09/22/2021 CLINICAL DATA:  Body aches and congestion with cough and sore throat. EXAM: CT ABDOMEN AND PELVIS WITHOUT CONTRAST TECHNIQUE: Multidetector CT imaging of the abdomen and pelvis was performed following the standard protocol without IV contrast. COMPARISON:  None. FINDINGS: Lower chest: No acute abnormality. Hepatobiliary: No focal liver abnormality is seen. The gallbladder is moderately distended.  No gallstones, gallbladder wall thickening, or biliary dilatation. Pancreas: Unremarkable. No pancreatic ductal dilatation or surrounding inflammatory changes. Spleen: Normal in size without focal abnormality. Adrenals/Urinary Tract: Adrenal glands are unremarkable. Kidneys are normal in size, without renal calculi or hydronephrosis. Multiple subcentimeter exophytic cystic appearing areas are seen involving both kidneys. The urinary bladder is partially contracted. Mild to moderate severity diffuse urinary bladder wall thickening is also seen. Stomach/Bowel: Stomach is within normal limits. Appendix appears normal. No evidence of bowel wall thickening, distention, or inflammatory changes. Vascular/Lymphatic: Aortic atherosclerosis. Subcentimeter para-aortic and aortocaval lymph nodes are present. Reproductive: Prostate is unremarkable. Other: No abdominal wall hernia or abnormality. Intact tubing from a peritoneal dialysis catheter is noted with its distal end seen just above the level of the urinary bladder. No abdominopelvic ascites. Musculoskeletal:  No acute or significant osseous findings. IMPRESSION: 1. Urinary bladder wall thickening which may be secondary to mild to moderate severity cystitis. Correlation with urinalysis is recommended. 2. Findings likely representing small renal cysts. Correlation with nonemergent renal ultrasound is recommended. 3. Aortic atherosclerosis. Aortic Atherosclerosis (ICD10-I70.0). Electronically Signed   By: Virgina Norfolk M.D.   On: 09/22/2021 16:51   CT CHEST WO CONTRAST  Result Date: 09/22/2021 CLINICAL DATA:  Cough, persistent EXAM: CT CHEST WITHOUT CONTRAST TECHNIQUE: Multidetector CT imaging of the chest was performed following the standard protocol without IV contrast. COMPARISON:  No prior chest CT FINDINGS: Cardiovascular: Moderate coronary and mild aortic atherosclerotic calcifications. No significant vascular findings. The heart is normal in size. No pericardial effusion. Mediastinum/Nodes: No enlarged mediastinal or axillary lymph nodes. Thyroid gland, trachea, and esophagus demonstrate no significant findings. Lungs/Pleura: No focal pulmonary opacity. No pleural effusion or pneumothorax. Upper Abdomen: Please see same day CT abdomen pelvis. Musculoskeletal: No chest wall mass or suspicious bone lesions identified. IMPRESSION: 1. No acute process in the chest. No etiology is seen for the patient's cough. 2. Aortic Atherosclerosis (ICD10-I70.0). Coronary artery atherosclerosis. Electronically Signed   By: Merilyn Baba M.D.   On: 09/22/2021 17:51   CT ANGIO LOW EXTREM LEFT W &/OR WO CONTRAST  Result Date: 09/28/2021 CLINICAL DATA:  Reduction defect, lower limb Soft tissue mass, foot, spontaneous hemorrhage EXAM: CT OF THE LOWER LEFT EXTREMITY WITHOUT CONTRAST TECHNIQUE: Multidetector CT imaging of the lower left extremity was performed according to the standard protocol. COMPARISON:  Radiographs same day FINDINGS: Vascular: Left lower extremity Common iliac artery: Patent without significant stenosis or  aneurysm. Scattered calcific atherosclerosis without significant stenosis. Internal iliac artery: Patent. External iliac artery: Patent without significant stenosis. Common femoral artery: Patent without significant stenosis. Profunda femoral artery: Patent without significant stenosis. Superficial femoral artery: Patent without significant stenosis. Scattered calcific atherosclerosis without significant stenosis. Popliteal artery: Patent without significant stenosis or aneurysm. Tibioperoneal trunk: Patent without significant stenosis. Runoff vessels: Patent without significant stenosis. There is a patent three-vessel runoff to the level of the ankle, scattered calcific atherosclerotic disease without significant stenosis or occlusion appreciated. Nonvascular: Status post amputation of the first and second toes with foci of gas and scattered radiodensities, presumably postsurgical in nature. No drainable organized fluid collection. IMPRESSION: 1. The left lower extremity arteries are patent without significant stenosis. There is a three-vessel runoff to the level of the ankle. No findings of active arterial extravasation or pseudoaneurysm associated with the surgical bed. 2. Status post amputation of the first and second toes with surgical defect containing foci of gas and antibiotic beads, presumably postsurgical although underlying infection can not be excluded. No drainable fluid collection.  Electronically Signed   By: Albin Felling M.D.   On: 09/28/2021 08:45   MR FOOT LEFT WO CONTRAST  Result Date: 09/26/2021 CLINICAL DATA:  Osteomyelitis, foot left foot abscess, MRSA bacteremia EXAM: MRI OF THE LEFT FOOT WITHOUT CONTRAST TECHNIQUE: Multiplanar, multisequence MR imaging of the left foot was performed. No intravenous contrast was administered. COMPARISON:  X-ray 09/22/2021 FINDINGS: Bones/Joint/Cartilage Extensive bone marrow edema with low T1 signal changes involving the proximal and distal phalanx of  the great toe. There is also involvement of the first metatarsal head and distal diaphysis. Serpiginous areas of T2 hyperintense signal within the first metatarsal diaphysis likely indicative of underlying bone infarct. Large complex first MTP joint effusion suggesting septic arthritis. Status post fifth ray resection at the level of the fifth metatarsal base. Preserved signal within the residual fifth metatarsal base. Remaining osseous structures of the foot are within normal limits. No additional sites of bone marrow edema. Ligaments Intact Lisfranc ligament. No evidence of collateral ligament disruption. Muscles and Tendons Chronic denervation changes of the intrinsic foot musculature with probable superimposed myositis. Amputation changes at the lateral forefoot. Mild tenosynovitis involving the flexor and extensor tendons of the great toe distally. Soft tissues Soft tissue ulceration along the medial aspect of the great toe at the level of the first metatarsal head. Underlying contiguous fluid collection measures approximately 5.6 x 4.2 x 4.0 cm, which appears to be contiguous with the first MTP joint space. Diffuse soft tissue edema. IMPRESSION: 1. Soft tissue ulceration at the level of the first metatarsal head. Findings of acute osteomyelitis involving the proximal and distal phalanx of the great toe as well as the first metatarsal head and distal diaphysis. 2. Large complex first MTP joint effusion compatible with septic arthritis. 3. Soft tissue abscess adjacent to the first metatarsal head measuring up to 5.6 cm. 4. Mild tenosynovitis involving the flexor and extensor tendons of the great toe distally. 5. Status post fifth ray resection at the level of the fifth metatarsal base. Preserved signal within the residual fifth metatarsal base. Electronically Signed   By: Davina Poke D.O.   On: 09/26/2021 16:02   US Venous Img Lower Unilateral Left (DVT)  Result Date: 09/25/2021 CLINICAL DATA:  Left  lower extremity pain for the past 3 weeks. Evaluate for DVT. EXAM: LEFT LOWER EXTREMITY VENOUS DOPPLER ULTRASOUND TECHNIQUE: Gray-scale sonography with graded compression, as well as color Doppler and duplex ultrasound were performed to evaluate the lower extremity deep venous systems from the level of the common femoral vein and including the common femoral, femoral, profunda femoral, popliteal and calf veins including the posterior tibial, peroneal and gastrocnemius veins when visible. The superficial great saphenous vein was also interrogated. Spectral Doppler was utilized to evaluate flow at rest and with distal augmentation maneuvers in the common femoral, femoral and popliteal veins. COMPARISON:  CT the chest, abdomen and pelvis-09/22/2021 FINDINGS: Contralateral Common Femoral Vein: Respiratory phasicity is normal and symmetric with the symptomatic side. No evidence of thrombus. Normal compressibility. Common Femoral Vein: No evidence of thrombus. Normal compressibility, respiratory phasicity and response to augmentation. Saphenofemoral Junction: No evidence of thrombus. Normal compressibility and flow on color Doppler imaging. Profunda Femoral Vein: No evidence of thrombus. Normal compressibility and flow on color Doppler imaging. Femoral Vein: No evidence of thrombus. Normal compressibility, respiratory phasicity and response to augmentation. Popliteal Vein: No evidence of thrombus. Normal compressibility, respiratory phasicity and response to augmentation. Calf Veins: No evidence of thrombus. Normal compressibility and flow on color Doppler  imaging. Superficial Great Saphenous Vein: No evidence of thrombus. Normal compressibility. Venous Reflux:  None. Other Findings: Note is made of mildly prominent though non pathologically enlarged left inguinal lymph nodes with index left inguinal lymph node measuring 1.1 cm in greatest short axis diameter (image 14). Borderline enlarged right inguinal lymph node  measures 1.5 cm in greatest short axis diameter with partial thickening on its cortex though maintenance of a benign fatty hilum (image 40). IMPRESSION: 1. No evidence of DVT within the left lower extremity. 2. Prominent bilateral inguinal lymph nodes, right greater than left, similar to recent abdominal CT, nonspecific though presumably reactive in etiology in the setting peritoneal dialysis. Clinical correlation is advised. Electronically Signed   By: Sandi Mariscal M.D.   On: 09/25/2021 12:44   US ARTERIAL ABI (SCREENING LOWER EXTREMITY)  Result Date: 09/27/2021 CLINICAL DATA:  50 year old male with a history foot abscess EXAM: NONINVASIVE PHYSIOLOGIC VASCULAR STUDY OF BILATERAL LOWER EXTREMITIES TECHNIQUE: Evaluation of both lower extremities was performed at rest, including calculation of ankle-brachial indices, multiple segmental pressure evaluation, segmental Doppler and segmental pulse volume recording. COMPARISON:  None. FINDINGS: Right ABI:  Noncompressible Left ABI:  1.43 Right Lower Extremity: Segmental Doppler demonstrates triphasic waveforms at the right ankle. Left Lower Extremity: Segmental Doppler at the left ankle demonstrates monophasic posterior tibial artery and biphasic dorsalis pedis with decreased amplitude. IMPRESSION: Right: Resting ABI is noncompressible. Segmental exam demonstrates triphasic waveforms at the right ankle. Left: Resting ABI is likely falsely elevated secondary to noncompressible vessels. Segmental exam at the left ankle demonstrates evidence of developing more proximal occlusive disease Signed, Dulcy Fanny. Dellia Nims, RPVI [facility] Electronically Signed   By: Corrie Mckusick D.O.   On: 09/27/2021 08:30   DG Chest Port 1 View  Result Date: 09/22/2021 CLINICAL DATA:  Questionable sepsis - evaluate for abnormality EXAM: PORTABLE CHEST 1 VIEW COMPARISON:  January 31, 2017. FINDINGS: Borderline enlargement of the  cardiac silhouette, likely accentuated by AP technique. Both lungs are clear. No visible pleural effusions or pneumothorax. No acute osseous abnormality. IMPRESSION: No evidence of acute cardiopulmonary disease. Electronically Signed   By: Margaretha Sheffield M.D.   On: 09/22/2021 13:49   DG Foot Complete Left  Result Date: 09/27/2021 CLINICAL DATA:  Post Op EXAM: LEFT FOOT - COMPLETE 3+ VIEW COMPARISON:  Left foot radiographs 09/22/2021 FINDINGS: Status post amputation of the first digit at the level of the mid/proximal metatarsal. Prior amputation of the fifth digit. No acute osseous finding identified. Small densities within the soft tissues of the medial foot presumably postsurgical. Regional soft tissue swelling. IMPRESSION: Status post amputation of the first digit at the level of the mid/proximal metatarsal. Electronically Signed   By: Audie Pinto M.D.   On: 09/27/2021 10:29   DG Foot Complete Left  Result Date: 09/22/2021 CLINICAL DATA:  Swelling, concern for osteomyelitis EXAM: LEFT FOOT - COMPLETE 3+ VIEW COMPARISON:  Left foot MRI 02/01/2017, left foot radiograph 01/31/2017 FINDINGS: The patient is status post amputation of most of the fifth toe metatarsal as well as the fifth toe. The remaining portion of the metatarsal is normal in appearance. There is no osseous destruction or erosion in the foot. There is no acute fracture or dislocation. Alignment is normal. The joint spaces are preserved. Vascular calcifications are noted. There is diffuse soft tissue swelling but no soft tissue gas. IMPRESSION: Postsurgical changes in the foot as above. There is no osseous erosion or destruction identified. There is  diffuse soft tissue swelling without soft tissue gas. If there is persistent clinical concern for osteomyelitis, MRI may be considered. Electronically Signed   By: Valetta Mole M.D.   On: 09/22/2021 13:49   ECHOCARDIOGRAM COMPLETE  Result Date: 09/23/2021    ECHOCARDIOGRAM REPORT    Patient Name:   Nathan Ayers Date of Exam: 09/22/2021 Medical Rec #:  [id number]      Height:       78.0 in Accession #:    [id number]     Weight:       230.0 lb Date of Birth:  1971-03-23      BSA:          2.396 m Patient Age:    57 years       BP:           119/55 mmHg Patient Gender: M              HR:           91 bpm. Exam Location:  [facility] Procedure: 2D Echo, Cardiac Doppler and Color Doppler Indications:     R01.1 Murmur  History:         Patient has no prior history of Echocardiogram examinations.                  Risk Factors:Hypertension and Diabetes. HIV Infection. Chronic                  kidney disease. Dyspnea.  Sonographer:     Cresenciano Lick RDCS Referring Phys:  Alvin YTKP Diagnosing Phys: Yolonda Kida MD IMPRESSIONS  1. Left ventricular ejection fraction, by estimation, is >75%. The left ventricle has hyperdynamic function. The left ventricle has no regional wall motion abnormalities. There is mild concentric left ventricular hypertrophy. Left ventricular diastolic parameters are consistent with Grade II diastolic dysfunction (pseudonormalization).  2. Right ventricular systolic function is normal. The right ventricular size is normal.  3. The mitral valve is grossly normal. No evidence of mitral valve regurgitation.  4. The aortic valve is grossly normal. Aortic valve regurgitation is not visualized. FINDINGS  Left Ventricle: Left ventricular ejection fraction, by estimation, is >75%. The left ventricle has hyperdynamic function. The left ventricle has no regional wall motion abnormalities. The left ventricular internal cavity size was normal in size. There is mild concentric left ventricular hypertrophy. Left ventricular diastolic parameters are consistent with Grade II diastolic dysfunction (pseudonormalization). Right Ventricle: The right ventricular size is normal. No increase in right ventricular wall thickness. Right ventricular systolic function is normal. Left  Atrium: Left atrial size was normal in size. Right Atrium: Right atrial size was normal in size. Pericardium: There is no evidence of pericardial effusion. Mitral Valve: The mitral valve is grossly normal. No evidence of mitral valve regurgitation. Tricuspid Valve: The tricuspid valve is grossly normal. Tricuspid valve regurgitation is mild. Aortic Valve: The aortic valve is grossly normal. Aortic valve regurgitation is not visualized. Aortic valve mean gradient measures 7.0 mmHg. Aortic valve peak gradient measures 11.6 mmHg. Aortic valve area, by VTI measures 3.31 cm. Pulmonic Valve: The pulmonic valve was grossly normal. Pulmonic valve regurgitation is not visualized. Aorta: The ascending aorta was not well visualized. IAS/Shunts: No atrial level shunt detected by color flow Doppler.  LEFT VENTRICLE PLAX 2D LVIDd:         4.40 cm   Diastology LVIDs:         2.50 cm   LV e' medial:  7.09 cm/s LV PW:         1.20 cm   LV E/e' medial:  13.9 LV IVS:        1.20 cm   LV e' lateral:   9.82 cm/s LVOT diam:     1.90 cm   LV E/e' lateral: 10.1 LV SV:         81 LV SV Index:   34 LVOT Area:     2.84 cm  RIGHT VENTRICLE RV Basal diam:  3.80 cm RV S prime:     16.60 cm/s TAPSE (M-mode): 1.6 cm LEFT ATRIUM             Index        RIGHT ATRIUM           Index LA diam:        4.10 cm 1.71 cm/m   RA Area:     14.50 cm LA Vol (A2C):   42.9 ml 17.91 ml/m  RA Volume:   37.10 ml  15.49 ml/m LA Vol (A4C):   35.7 ml 14.90 ml/m LA Biplane Vol: 40.2 ml 16.78 ml/m  AORTIC VALVE AV Area (Vmax):    2.62 cm AV Area (Vmean):   2.77 cm AV Area (VTI):     3.31 cm AV Vmax:           170.00 cm/s AV Vmean:          131.000 cm/s AV VTI:            0.246 m AV Peak Grad:      11.6 mmHg AV Mean Grad:      7.0 mmHg LVOT Vmax:         157.00 cm/s LVOT Vmean:        128.000 cm/s LVOT VTI:          0.287 m LVOT/AV VTI ratio: 1.17  AORTA Ao Root diam: 3.40 cm Ao Asc diam:  3.50 cm MITRAL VALVE MV Area (PHT): 2.60 cm    SHUNTS MV Decel Time:  292 msec    Systemic VTI:  0.29 m MV E velocity: 98.80 cm/s  Systemic Diam: 1.90 cm MV A velocity: 90.70 cm/s MV E/A ratio:  1.09 Dwayne D Callwood MD Electronically signed by Yolonda Kida MD Signature Date/Time: 09/23/2021/1:17:04 PM    Final    ECHO TEE  Result Date: 09/24/2021    TRANSESOPHOGEAL ECHO REPORT   Patient Name:   Nathan Ayers Date of Exam: 09/24/2021 Medical Rec #:  [id number]      Height:       78.0 in Accession #:    [id number]     Weight:       230.0 lb Date of Birth:  [birth year]      BSA:          2.396 m Patient Age:    36 years       BP:           100/42 mmHg Patient Gender: M              HR:           81 bpm. Exam Location:  [facility] Procedure: Transesophageal Echo, Cardiac Doppler and Color Doppler Indications:     MRSA bacteremia, endocarditis, aorta atheroma  History:         Patient has prior history of Echocardiogram examinations, most  recent 09/22/2021. Risk Factors:Hypertension and Diabetes. CKD.  Sonographer:     Sherrie Sport Referring Phys:  Warson Woods Diagnosing Phys: Ida Rogue MD PROCEDURE: After discussion of the risks and benefits of a TEE, an informed consent was obtained from the patient. TEE procedure time was 30 minutes. The transesophogeal probe was passed without difficulty through the esophogus of the patient. Imaged were obtained with the patient in a left lateral decubitus position. Local oropharyngeal anesthetic was provided with Cetacaine and viscous lidocaine. Sedation performed by performing physician. Patients was under conscious sedation during this procedure. Anesthetic administered: 159mg of Fentanyl, 4.055mof Versed. Image quality was excellent. The patient's vital signs; including heart rate, blood pressure, and oxygen saturation; remained stable throughout the procedure. The patient developed no complications during the procedure. IMPRESSIONS  1. No valve endocarditis  2. Left ventricular ejection fraction, by estimation,  is 60 to 65%. The left ventricle has normal function. The left ventricle has no regional wall motion abnormalities.  3. Right ventricular systolic function is normal. The right ventricular size is normal.  4. No left atrial/left atrial appendage thrombus was detected.  5. The mitral valve is normal in structure. No evidence of mitral valve regurgitation. No evidence of mitral stenosis.  6. The aortic valve is normal in structure. Aortic valve regurgitation is trivial. Aortic valve sclerosis is present, with no evidence of aortic valve stenosis.  7. There is mild (Grade II) atheroma plaque involving the descending aorta.  8. The inferior vena cava is normal in size with greater than 50% respiratory variability, suggesting right atrial pressure of 3 mmHg.  9. Agitated saline contrast bubble study was negative, with no evidence of any interatrial shunt. Conclusion(s)/Recommendation(s): Normal biventricular function without evidence of hemodynamically significant valvular heart disease. FINDINGS  Left Ventricle: Left ventricular ejection fraction, by estimation, is 60 to 65%. The left ventricle has normal function. The left ventricle has no regional wall motion abnormalities. The left ventricular internal cavity size was normal in size. There is  no left ventricular hypertrophy. Right Ventricle: The right ventricular size is normal. No increase in right ventricular wall thickness. Right ventricular systolic function is normal. Left Atrium: Left atrial size was normal in size. No left atrial/left atrial appendage thrombus was detected. Right Atrium: Right atrial size was normal in size. Pericardium: There is no evidence of pericardial effusion. Mitral Valve: The mitral valve is normal in structure. No evidence of mitral valve regurgitation. No evidence of mitral valve stenosis. Tricuspid Valve: The tricuspid valve is normal in structure. Tricuspid valve regurgitation is mild . No evidence of tricuspid stenosis. Aortic  Valve: The aortic valve is normal in structure. Aortic valve regurgitation is trivial. Aortic valve sclerosis is present, with no evidence of aortic valve stenosis. Pulmonic Valve: The pulmonic valve was normal in structure. Pulmonic valve regurgitation is not visualized. No evidence of pulmonic stenosis. Aorta: The aortic root is normal in size and structure. There is mild (Grade II) atheroma plaque involving the descending aorta. Venous: The inferior vena cava is normal in size with greater than 50% respiratory variability, suggesting right atrial pressure of 3 mmHg. IAS/Shunts: No atrial level shunt detected by color flow Doppler. Agitated saline contrast was given intravenously to evaluate for intracardiac shunting. Agitated saline contrast bubble study was negative, with no evidence of any interatrial shunt. There  is no evidence of a patent foramen ovale. There is no evidence of an atrial septal defect. TiIda RogueD Electronically signed by TiIda RogueD  Signature Date/Time: 09/24/2021/6:29:20 PM    Final (Updated)     Labs:  CBC: Recent Labs    09/24/21 0552 09/26/21 0455 09/27/21 0459 09/28/21 0411  WBC 21.4* 18.1* 13.9* 11.1*  HGB 8.5* 7.9* 7.8* 6.9*  HCT 24.4* 23.7* 22.9* 19.9*  PLT 297 309 340 303    COAGS: Recent Labs    09/22/21 1332  INR 1.3*  APTT 34    BMP: Recent Labs    09/24/21 0552 09/26/21 0455 09/27/21 0459 09/28/21 0411  NA 133* 134* 133* 131*  K 3.4* 3.6 3.6 3.9  CL 94* 95* 94* 94*  CO2 22 24 23 26   GLUCOSE 215* 224* 172* 120*  BUN 97* 89* 88* 75*  CALCIUM 8.7* 8.7* 8.8* 8.4*  CREATININE 20.61* 18.71* 18.37* 17.06*  GFRNONAA 2* 3* 3* 3*    LIVER FUNCTION TESTS: Recent Labs    09/22/21 1332 09/23/21 0445  BILITOT 1.0 0.7  AST 12* 14*  ALT 15 14  ALKPHOS 73 73  PROT 8.5* 7.9  ALBUMIN 2.6* 2.4*    Assessment and Plan: 50 year old male who presented to ED 11/15 with URI symptoms, fever and overall not feeling well. He is now  admitted for MRSA sepsis secondary to left foot abscess with septic first metatarsal phalangeal joint with osteomyelitis s/p left partial first ray amputation and application of antibiotic beads on 11/20 by Podiatry. Repeat Blood Cx 11/18 no growth and overall clinically improving.  Patient with known PMHx of ESRD on peritoneal dialysis, history of previously placed right IJ HD catheter, right upper extremity AVF. IR received request for image guided IJ tunneled central venous catheter for outpatient long term antibiotic therapy.   The patient will be NPO after midnight for moderate sedation 11/22, labs and vitals have been reviewed.  Risks and benefits discussed with the patient including, but not limited to bleeding, infection, vascular injury or damage to adjacent structures.  All of the patient's questions were answered, patient is agreeable to proceed. Consent signed and in chart.   Thank you for this interesting consult.  I greatly enjoyed meeting Nathan Ayers and look forward to participating in their care.  A copy of this report was sent to the requesting provider on this date.  Electronically Signed: Hedy Jacob, PA-C 09/28/2021, 2:26 PM   I spent a total of 20 Minutes in face to face in clinical consultation, greater than 50% of which was counseling/coordinating care for osteomyelitis with bacteremia.

## 2021-09-28 NOTE — Evaluation (Signed)
Physical Therapy Evaluation Patient Details Name: JAQWAN WIEBER MRN: [id number] DOB: 04/16/1971 Today's Date: 09/28/2021  History of Present Illness  Patient is a 50 year old male with medical history significant for HIV, ESRD on peritoneal dialysis, OSA, HTN who has Bacteremia and sepsis secondary to left septic first metatarsal phalangeal joint with associated abscess, cellulitis, and osteomyelitis. s/p left partial first ray amputation and application of antibiotic beeds.  Clinical Impression  Patient agreeable to PT. He is eager to return home and have home health services at discharge. Patient educated on techniques to maintain NWB of LLE with transfers and ambulation. Patient was able to get out of bed with surgical shoe and ambulate a short distance while maintaining weight bearing status. Activity tolerance is limited by fatigue and mild dizziness with upright activity. Patient is generally deconditioned and would benefit from continued to PT to maximize independence. Recommend HHPT and a rolling walker for home.      Recommendations for follow up therapy are one component of a multi-disciplinary discharge planning process, led by the attending physician.  Recommendations may be updated based on patient status, additional functional criteria and insurance authorization.  Follow Up Recommendations Home health PT    Assistance Recommended at Discharge Intermittent Supervision/Assistance  Functional Status Assessment Patient has had a recent decline in their functional status and demonstrates the ability to make significant improvements in function in a reasonable and predictable amount of time.  Equipment Recommendations  Rolling walker (2 wheels)    Recommendations for Other Services       Precautions / Restrictions Precautions Precautions: Fall Required Braces or Orthoses:  (surgical shoe) Restrictions Weight Bearing Restrictions: Yes LLE Weight Bearing: Non weight  bearing Other Position/Activity Restrictions: L foot NWB. Heel contact in surgical shoe for transfers only      Mobility  Bed Mobility Overal bed mobility: Modified Independent                  Transfers Overall transfer level: Modified independent Equipment used: Rolling walker (2 wheels)               General transfer comment: verbal cues for technique to maintain NWB of LLE. surgical shoe donned to LLE prior to mobilizing    Ambulation/Gait Ambulation/Gait assistance: Min guard Gait Distance (Feet): 6 Feet Assistive device: Rolling walker (2 wheels) Gait Pattern/deviations: Decreased stride length Gait velocity: decreased     General Gait Details: verbal cues for technique to maintain NWB of LLE. patient demonstrated correct technique with initial instruction. patient decliend walking further due to fatigue and mild dizziness  Stairs            Wheelchair Mobility    Modified Rankin (Stroke Patients Only)       Balance Overall balance assessment: Needs assistance Sitting-balance support: Feet supported Sitting balance-Leahy Scale: Normal     Standing balance support: Bilateral upper extremity supported;During functional activity;Reliant on assistive device for balance Standing balance-Leahy Scale: Fair Standing balance comment: patient needs RW for UE support in standing                             Pertinent Vitals/Pain Pain Assessment: 0-10 Pain Score: 3  Pain Location: left forefoot Pain Descriptors / Indicators: Shooting Pain Intervention(s): Limited activity within patient's tolerance    Home Living Family/patient expects to be discharged to:: Private residence Living Arrangements: Alone Available Help at Discharge: Family;Available PRN/intermittently Type of Home: [place]  Access: Level entry     Alternate Level Stairs-Number of Steps: 1 flight Home Layout: Two level;Bed/bath upstairs;1/2 bath on main level Home  Equipment: Cane - single point;Other (comment) (knee scooter) Additional Comments: patient does peritoneal dialysis at home daily per his report   Prior Function Prior Level of Function : History of Falls (last six months);Independent/Modified Independent             Mobility Comments: patient reports using single point cane or knee scooter. patient reports 3 falls in the past 6 months (felt dizzy, knees gave out) ADLs Comments: independent     Hand Dominance        Extremity/Trunk Assessment   Upper Extremity Assessment Upper Extremity Assessment: Overall WFL for tasks assessed    Lower Extremity Assessment Lower Extremity Assessment: Overall WFL for tasks assessed (endurance impaired for sustained standing activity)       Communication   Communication: No difficulties  Cognition Arousal/Alertness: Awake/alert Behavior During Therapy: WFL for tasks assessed/performed Overall Cognitive Status: Within Functional Limits for tasks assessed                                 General Comments: patient able to follow all commands without difficulty        General Comments General comments (skin integrity, edema, etc.): encouraged patient to elevate LLE when not mobilizing for edema and pain management    Exercises     Assessment/Plan    PT Assessment Patient needs continued PT services  PT Problem List Decreased strength;Decreased activity tolerance;Decreased balance;Decreased mobility;Decreased safety awareness       PT Treatment Interventions DME instruction;Gait training;Functional mobility training;Stair training;Therapeutic exercise;Therapeutic activities;Balance training;Neuromuscular re-education;Patient/family education    PT Goals (Current goals can be found in the Care Plan section)  Acute Rehab PT Goals Patient Stated Goal: to go home and have home health PT Goal Formulation: With patient Time For Goal Achievement: 10/12/21 Potential to  Achieve Goals: Good    Frequency Min 2X/week   Barriers to discharge        Co-evaluation               AM-PAC PT "6 Clicks" Mobility  Outcome Measure Help needed turning from your back to your side while in a flat bed without using bedrails?: None Help needed moving from lying on your back to sitting on the side of a flat bed without using bedrails?: A Little Help needed moving to and from a bed to a chair (including a wheelchair)?: A Little Help needed standing up from a chair using your arms (e.g., wheelchair or bedside chair)?: A Little Help needed to walk in hospital room?: A Little Help needed climbing 3-5 steps with a railing? : A Little 6 Click Score: 19    End of Session   Activity Tolerance: Patient tolerated treatment well Patient left: in bed;with call bell/phone within reach Nurse Communication: Mobility status (discucsed with nurse aide) PT Visit Diagnosis: Unsteadiness on feet (R26.81);Muscle weakness (generalized) (M62.81)    Time: 8016-5537 PT Time Calculation (min) (ACUTE ONLY): 29 min   Charges:   PT Evaluation $PT Eval Low Complexity: 1 Low PT Treatments $Therapeutic Activity: 8-22 mins        [name], PT, MPT   [name] 09/28/2021, 12:14 PM

## 2021-09-28 NOTE — Progress Notes (Signed)
[facility]  ROUNDING NOTE   Subjective:   Patient underwent left first ray amputation and antibiotic beads for his osteomyelitis this admission States that he is able to eat without nausea vomiting No significant leg edema No significant shortness of breath     Objective:  Vital signs in last 24 hours:  Temp:  [98 F (36.7 C)-98.5 F (36.9 C)] 98.1 F (36.7 C) (11/21 1239) Pulse Rate:  [76-80] 79 (11/21 1239) Resp:  [16-18] 18 (11/21 1239) BP: (99-154)/(54-80) 142/66 (11/21 1239) SpO2:  [97 %-100 %] 100 % (11/21 1239)  Weight change:  Filed Weights   09/22/21 1123 09/24/21 1333  Weight: 104.3 kg 104.3 kg    Intake/Output: I/O last 3 completed shifts: In: -  Out: 200 [Blood:200]   Intake/Output this shift:  Total I/O In: -  Out: 1 [Stool:1]  Physical Exam: General: NAD, sitting up in bed  Head: Normocephalic, atraumatic. Moist oral mucosal membranes  Eyes: Anicteric,   Lungs:  Clear to auscultation  Heart: Regular rate and rhythm  Abdomen:  Soft, nontender, PD catheter  Extremities: No peripheral edema. Right foot dressings clean, dry and intact  Neurologic: Nonfocal, moving all four extremities  Skin: No lesions  Access: PD catheter, right radiocephalic AVF    Basic Metabolic Panel: Recent Labs  Lab 09/23/21 0445 09/24/21 0552 09/26/21 0455 09/27/21 0459 09/28/21 0411  NA 134* 133* 134* 133* 131*  K 4.5 3.4* 3.6 3.6 3.9  CL 92* 94* 95* 94* 94*  CO2 22 22 24 23 26   GLUCOSE 147* 215* 224* 172* 120*  BUN 106* 97* 89* 88* 75*  CREATININE 24.45* 20.61* 18.71* 18.37* 17.06*  CALCIUM 9.3 8.7* 8.7* 8.8* 8.4*     Liver Function Tests: Recent Labs  Lab 09/22/21 1332 09/23/21 0445  AST 12* 14*  ALT 15 14  ALKPHOS 73 73  BILITOT 1.0 0.7  PROT 8.5* 7.9  ALBUMIN 2.6* 2.4*    No results for input(s): LIPASE, AMYLASE in the last 168 hours. No results for input(s): AMMONIA in the last 168 hours.  CBC: Recent Labs  Lab  09/22/21 1332 09/23/21 0445 09/23/21 0643 09/24/21 0552 09/26/21 0455 09/27/21 0459 09/28/21 0411  WBC 25.9*   < > 25.7* 21.4* 18.1* 13.9* 11.1*  NEUTROABS 20.7*  --  21.7*  --   --   --   --   HGB 9.2*   < > 9.1* 8.5* 7.9* 7.8* 6.9*  HCT 26.2*   < > 25.6* 24.4* 23.7* 22.9* 19.9*  MCV 84.8   < > 82 82.2 84.3 83.3 84.0  PLT 303   < > 330 297 309 340 303   < > = values in this interval not displayed.     Cardiac Enzymes: Recent Labs  Lab 09/28/21 0411  CKTOTAL 70    BNP: Invalid input(s): POCBNP  CBG: Recent Labs  Lab 09/27/21 1122 09/27/21 1656 09/27/21 2057 09/28/21 0824 09/28/21 1143  GLUCAP 112* 140* 173* 110* 120*     Microbiology: Results for orders placed or performed during the hospital encounter of 09/22/21  Resp Panel by RT-PCR (Flu A&B, Covid) Nasopharyngeal Swab     Status: None   Collection Time: 09/22/21 11:30 AM   Specimen: Nasopharyngeal Swab; Nasopharyngeal(NP) swabs in vial transport medium  Result Value Ref Range Status   SARS Coronavirus 2 by RT PCR NEGATIVE NEGATIVE Final    Comment: (NOTE) SARS-CoV-2 target nucleic acids are NOT DETECTED.  The SARS-CoV-2 RNA is generally detectable in upper  respiratory specimens during the acute phase of infection. The lowest concentration of SARS-CoV-2 viral copies this assay can detect is 138 copies/mL. A negative result does not preclude SARS-Cov-2 infection and should not be used as the sole basis for treatment or other patient management decisions. A negative result may occur with  improper specimen collection/handling, submission of specimen other than nasopharyngeal swab, presence of viral mutation(s) within the areas targeted by this assay, and inadequate number of viral copies(<138 copies/mL). A negative result must be combined with clinical observations, patient history, and epidemiological information. The expected result is Negative.  Fact Sheet for Patients:   EntrepreneurPulse.com.au  Fact Sheet for Healthcare Providers:  IncredibleEmployment.be  This test is no t yet approved or cleared by the [place] FDA and  has been authorized for detection and/or diagnosis of SARS-CoV-2 by FDA under an Emergency Use Authorization (EUA). This EUA will remain  in effect (meaning this test can be used) for the duration of the COVID-19 declaration under Section 564(b)(1) of the Act, 21 U.S.C.section 360bbb-3(b)(1), unless the authorization is terminated  or revoked sooner.       Influenza A by PCR NEGATIVE NEGATIVE Final   Influenza B by PCR NEGATIVE NEGATIVE Final    Comment: (NOTE) The Xpert Xpress SARS-CoV-2/FLU/RSV plus assay is intended as an aid in the diagnosis of influenza from Nasopharyngeal swab specimens and should not be used as a sole basis for treatment. Nasal washings and aspirates are unacceptable for Xpert Xpress SARS-CoV-2/FLU/RSV testing.  Fact Sheet for Patients: EntrepreneurPulse.com.au  Fact Sheet for Healthcare Providers: IncredibleEmployment.be  This test is not yet approved or cleared by the [place] FDA and has been authorized for detection and/or diagnosis of SARS-CoV-2 by FDA under an Emergency Use Authorization (EUA). This EUA will remain in effect (meaning this test can be used) for the duration of the COVID-19 declaration under Section 564(b)(1) of the Act, 21 U.S.C. section 360bbb-3(b)(1), unless the authorization is terminated or revoked.  Performed at [facility], [place], [place] [address]   Respiratory (~20 pathogens) panel by PCR     Status: None   Collection Time: 09/22/21 11:30 AM   Specimen: Peritoneal Washings; Respiratory  Result Value Ref Range Status   Adenovirus NOT DETECTED NOT DETECTED Final   Coronavirus 229E NOT DETECTED NOT DETECTED Final    Comment: (NOTE) The Coronavirus on the  Respiratory Panel, DOES NOT test for the novel  Coronavirus (2019 nCoV)    Coronavirus HKU1 NOT DETECTED NOT DETECTED Final   Coronavirus NL63 NOT DETECTED NOT DETECTED Final   Coronavirus OC43 NOT DETECTED NOT DETECTED Final   Metapneumovirus NOT DETECTED NOT DETECTED Final   Rhinovirus / Enterovirus NOT DETECTED NOT DETECTED Final   Influenza A NOT DETECTED NOT DETECTED Final   Influenza B NOT DETECTED NOT DETECTED Final   Parainfluenza Virus 1 NOT DETECTED NOT DETECTED Final   Parainfluenza Virus 2 NOT DETECTED NOT DETECTED Final   Parainfluenza Virus 3 NOT DETECTED NOT DETECTED Final   Parainfluenza Virus 4 NOT DETECTED NOT DETECTED Final   Respiratory Syncytial Virus NOT DETECTED NOT DETECTED Final   Bordetella pertussis NOT DETECTED NOT DETECTED Final   Bordetella Parapertussis NOT DETECTED NOT DETECTED Final   Chlamydophila pneumoniae NOT DETECTED NOT DETECTED Final   Mycoplasma pneumoniae NOT DETECTED NOT DETECTED Final    Comment: Performed at [facility], [place], [place], [place] [address]  Blood Culture (routine x 2)  Status: Abnormal   Collection Time: 09/22/21  1:32 PM   Specimen: BLOOD  Result Value Ref Range Status   Specimen Description   Final    BLOOD LEFT ANTECUBITAL Performed at [facility], [address] [place], [place] [address]    Special Requests   Final    BOTTLES DRAWN AEROBIC AND ANAEROBIC Blood Culture results may not be optimal due to an excessive volume of blood received in culture bottles Performed at [facility], [address] [place], [place] [address]    Culture  Setup Time   Final    GRAM POSITIVE COCCI ANAEROBIC BOTTLE ONLY CRITICAL VALUE NOTED.  VALUE IS CONSISTENT WITH PREVIOUSLY REPORTED AND CALLED VALUE.    Culture (A)  Final    STAPHYLOCOCCUS AUREUS SUSCEPTIBILITIES PERFORMED ON PREVIOUS CULTURE WITHIN THE LAST 5 DAYS. Performed at [facility], [place] [address] [place], [place]  [address]    Report Status 09/27/2021 FINAL  Final  Blood Culture (routine x 2)     Status: Abnormal   Collection Time: 09/22/21  1:32 PM   Specimen: BLOOD  Result Value Ref Range Status   Specimen Description   Final    BLOOD LEFT ANTECUBITAL Performed at [facility], [address] [place], [place] [address]    Special Requests   Final    BOTTLES DRAWN AEROBIC AND ANAEROBIC Blood Culture adequate volume Performed at [facility], [address] [place], [place] [address]    Culture  Setup Time   Final    GRAM POSITIVE COCCI IN BOTH AEROBIC AND ANAEROBIC BOTTLES Organism ID to follow CRITICAL RESULT CALLED TO, READ BACK BY AND VERIFIED WITH: Ardeen Garland, PHARMD AT 0725 ON 09/23/21 BY GM Performed at [facility], [address] [place], [place] [address]    Culture METHICILLIN RESISTANT STAPHYLOCOCCUS AUREUS (A)  Final   Report Status 09/25/2021 FINAL  Final   Organism ID, Bacteria METHICILLIN RESISTANT STAPHYLOCOCCUS AUREUS  Final      Susceptibility   Methicillin resistant staphylococcus aureus - MIC*    CIPROFLOXACIN 2 INTERMEDIATE Intermediate     ERYTHROMYCIN >=8 RESISTANT Resistant     GENTAMICIN <=0.5 SENSITIVE Sensitive     OXACILLIN >=4 RESISTANT Resistant     TETRACYCLINE <=1 SENSITIVE Sensitive     VANCOMYCIN 1 SENSITIVE Sensitive     TRIMETH/SULFA <=10 SENSITIVE Sensitive     CLINDAMYCIN <=0.25 SENSITIVE Sensitive     RIFAMPIN <=0.5 SENSITIVE Sensitive     Inducible Clindamycin NEGATIVE Sensitive     * METHICILLIN RESISTANT STAPHYLOCOCCUS AUREUS  Blood Culture ID Panel (Reflexed)     Status: Abnormal   Collection Time: 09/22/21  1:32 PM  Result Value Ref Range Status   Enterococcus faecalis NOT DETECTED NOT DETECTED Final   Enterococcus Faecium NOT DETECTED NOT DETECTED Final   Listeria monocytogenes NOT DETECTED NOT DETECTED Final   Staphylococcus species DETECTED (A) NOT DETECTED Final    Comment: CRITICAL RESULT CALLED TO,  READ BACK BY AND VERIFIED WITH: MORGAN HICKS, PHARMD AT 0725 ON 09/23/21 BY GM    Staphylococcus aureus (BCID) DETECTED (A) NOT DETECTED Final    Comment: Methicillin (oxacillin)-resistant Staphylococcus aureus (MRSA). MRSA is predictably resistant to beta-lactam antibiotics (except ceftaroline). Preferred therapy is vancomycin unless clinically contraindicated. Patient requires contact precautions if  hospitalized. CRITICAL RESULT CALLED TO, READ BACK BY AND VERIFIED WITH: MORGAN HICKS, PHARMD AT 0725 ON 09/23/21 BY GM    Staphylococcus epidermidis NOT DETECTED NOT  DETECTED Final   Staphylococcus lugdunensis NOT DETECTED NOT DETECTED Final   Streptococcus species NOT DETECTED NOT DETECTED Final   Streptococcus agalactiae NOT DETECTED NOT DETECTED Final   Streptococcus pneumoniae NOT DETECTED NOT DETECTED Final   Streptococcus pyogenes NOT DETECTED NOT DETECTED Final   A.calcoaceticus-baumannii NOT DETECTED NOT DETECTED Final   Bacteroides fragilis NOT DETECTED NOT DETECTED Final   Enterobacterales NOT DETECTED NOT DETECTED Final   Enterobacter cloacae complex NOT DETECTED NOT DETECTED Final   Escherichia coli NOT DETECTED NOT DETECTED Final   Klebsiella aerogenes NOT DETECTED NOT DETECTED Final   Klebsiella oxytoca NOT DETECTED NOT DETECTED Final   Klebsiella pneumoniae NOT DETECTED NOT DETECTED Final   Proteus species NOT DETECTED NOT DETECTED Final   Salmonella species NOT DETECTED NOT DETECTED Final   Serratia marcescens NOT DETECTED NOT DETECTED Final   Haemophilus influenzae NOT DETECTED NOT DETECTED Final   Neisseria meningitidis NOT DETECTED NOT DETECTED Final   Pseudomonas aeruginosa NOT DETECTED NOT DETECTED Final   Stenotrophomonas maltophilia NOT DETECTED NOT DETECTED Final   Candida albicans NOT DETECTED NOT DETECTED Final   Candida auris NOT DETECTED NOT DETECTED Final   Candida glabrata NOT DETECTED NOT DETECTED Final   Candida krusei NOT DETECTED NOT DETECTED Final    Candida parapsilosis NOT DETECTED NOT DETECTED Final   Candida tropicalis NOT DETECTED NOT DETECTED Final   Cryptococcus neoformans/gattii NOT DETECTED NOT DETECTED Final   Meth resistant mecA/C and MREJ DETECTED (A) NOT DETECTED Final    Comment: CRITICAL RESULT CALLED TO, READ BACK BY AND VERIFIED WITH: Ardeen Garland, PHARMD AT 0725 ON 09/23/21 BY GM Performed at [facility], [place], [place] [address]   Group A Strep by PCR [facility] Only)     Status: None   Collection Time: 09/22/21  1:40 PM   Specimen: Throat; Sterile Swab  Result Value Ref Range Status   Group A Strep by PCR NOT DETECTED NOT DETECTED Final    Comment: Performed at [facility], [address] [place], [place] [address]  Peritoneal fluid culture w Gram Stain     Status: None   Collection Time: 09/22/21  7:29 PM   Specimen: Peritoneal Washings; Peritoneal Fluid  Result Value Ref Range Status   Specimen Description   Final    PERITONEAL Performed at [facility], [address] [place], [place] [address]    Special Requests   Final    NONE Performed at [facility], [address] [place], [place] [address]    Gram Stain   Final    FEW WBC PRESENT, PREDOMINANTLY MONONUCLEAR NO ORGANISMS SEEN    Culture   Final    NO GROWTH 3 DAYS Performed at [facility], [place] [address], [place], [place] [address]    Report Status 09/26/2021 FINAL  Final  Gastrointestinal Panel by PCR , Stool     Status: None   Collection Time: 09/23/21  4:45 AM   Specimen: Stool  Result Value Ref Range Status   Campylobacter species NOT DETECTED NOT DETECTED Final   Plesimonas shigelloides NOT DETECTED NOT DETECTED Final   Salmonella species NOT DETECTED NOT DETECTED Final   Yersinia enterocolitica NOT DETECTED NOT DETECTED Final   Vibrio species NOT DETECTED NOT DETECTED Final   Vibrio cholerae NOT DETECTED NOT DETECTED Final   Enteroaggregative E coli (EAEC) NOT DETECTED  NOT DETECTED Final   Enteropathogenic E coli (EPEC) NOT DETECTED NOT DETECTED Final  Enterotoxigenic E coli (ETEC) NOT DETECTED NOT DETECTED Final   Shiga like toxin producing E coli (STEC) NOT DETECTED NOT DETECTED Final   Shigella/Enteroinvasive E coli (EIEC) NOT DETECTED NOT DETECTED Final   Cryptosporidium NOT DETECTED NOT DETECTED Final   Cyclospora cayetanensis NOT DETECTED NOT DETECTED Final   Entamoeba histolytica NOT DETECTED NOT DETECTED Final   Giardia lamblia NOT DETECTED NOT DETECTED Final   Adenovirus F40/41 NOT DETECTED NOT DETECTED Final   Astrovirus NOT DETECTED NOT DETECTED Final   Norovirus GI/GII NOT DETECTED NOT DETECTED Final   Rotavirus A NOT DETECTED NOT DETECTED Final   Sapovirus (I, II, IV, and V) NOT DETECTED NOT DETECTED Final    Comment: Performed at [facility], [address] [place], [place] [address]  C Difficile Quick Screen w PCR reflex     Status: None   Collection Time: 09/23/21  4:45 AM   Specimen: Stool  Result Value Ref Range Status   C Diff antigen NEGATIVE NEGATIVE Final   C Diff toxin NEGATIVE NEGATIVE Final   C Diff interpretation No C. difficile detected.  Final    Comment: Performed at [facility], [address] [place], [place] [address]  CULTURE, BLOOD (ROUTINE X 2) w Reflex to ID Panel     Status: None (Preliminary result)   Collection Time: 09/25/21  6:12 AM   Specimen: BLOOD  Result Value Ref Range Status   Specimen Description BLOOD LEFT ARM  Final   Special Requests   Final    BOTTLES DRAWN AEROBIC AND ANAEROBIC Blood Culture adequate volume   Culture   Final    NO GROWTH 3 DAYS Performed at [facility], [address], [place], [place] [address]    Report Status PENDING  Incomplete  CULTURE, BLOOD (ROUTINE X 2) w Reflex to ID Panel     Status: None (Preliminary result)   Collection Time: 09/25/21  6:12 AM   Specimen: BLOOD  Result Value Ref Range Status   Specimen Description BLOOD RIGHT  HAND  Final   Special Requests   Final    BOTTLES DRAWN AEROBIC AND ANAEROBIC Blood Culture adequate volume   Culture   Final    NO GROWTH 3 DAYS Performed at [facility], [address] [place], [place] [address]    Report Status PENDING  Incomplete  Aerobic/Anaerobic Culture w Gram Stain (surgical/deep wound)     Status: None (Preliminary result)   Collection Time: 09/26/21  2:58 PM   Specimen: Foot; Wound  Result Value Ref Range Status   Specimen Description   Final    FOOT LEFT Performed at [facility], [address], [place], [place] [address]    Special Requests   Final    NONE Performed at [facility], [place], [place] [address]    Gram Stain   Final    NO SQUAMOUS EPITHELIAL CELLS SEEN FEW WBC SEEN FEW GRAM POSITIVE COCCI Performed at [facility], [place] [address], [place], [place] [address]    Culture   Final    MODERATE METHICILLIN RESISTANT STAPHYLOCOCCUS AUREUS NO ANAEROBES ISOLATED; CULTURE IN PROGRESS FOR 5 DAYS    Report Status PENDING  Incomplete   Organism ID, Bacteria METHICILLIN RESISTANT STAPHYLOCOCCUS AUREUS  Final      Susceptibility   Methicillin resistant staphylococcus aureus - MIC*    CIPROFLOXACIN 2 INTERMEDIATE Intermediate     ERYTHROMYCIN >=8 RESISTANT Resistant  GENTAMICIN <=0.5 SENSITIVE Sensitive     OXACILLIN >=4 RESISTANT Resistant     TETRACYCLINE <=1 SENSITIVE Sensitive     VANCOMYCIN 1 SENSITIVE Sensitive     TRIMETH/SULFA <=10 SENSITIVE Sensitive     CLINDAMYCIN <=0.25 SENSITIVE Sensitive     RIFAMPIN <=0.5 SENSITIVE Sensitive     Inducible Clindamycin NEGATIVE Sensitive     * MODERATE METHICILLIN RESISTANT STAPHYLOCOCCUS AUREUS  Surgical PCR screen     Status: Abnormal   Collection Time: 09/27/21  4:43 AM   Specimen: Nasal Mucosa; Nasal Swab  Result Value Ref Range Status   MRSA, PCR POSITIVE (A) NEGATIVE Final    Comment: RESULT CALLED TO, READ BACK BY AND VERIFIED  WITH: CHARLOTTE KYEI AT 1601 ON 09/27/21 [place].    Staphylococcus aureus POSITIVE (A) NEGATIVE Final    Comment: (NOTE) The Xpert SA Assay (FDA approved for NASAL specimens in patients 66 years of age and older), is one component of a comprehensive surveillance program. It is not intended to diagnose infection nor to guide or monitor treatment. Performed at [facility], [address] [place], [place] [address]   Aerobic/Anaerobic Culture w Gram Stain (surgical/deep wound)     Status: None (Preliminary result)   Collection Time: 09/27/21  9:17 AM   Specimen: PATH Digit amputation; Tissue  Result Value Ref Range Status   Specimen Description   Final    WOUND Performed at [facility], [address] [place], [place] [address]    Special Requests   Final    DIGIT Performed at [facility], [place], [place] [address]    Gram Stain   Final    NO SQUAMOUS EPITHELIAL CELLS SEEN FEW WBC SEEN FEW GRAM POSITIVE COCCI    Culture   Final    FEW STAPHYLOCOCCUS AUREUS SUSCEPTIBILITIES TO FOLLOW Performed at [facility], [place] [address] [place], [place] [address]    Report Status PENDING  Incomplete    Coagulation Studies: No results for input(s): LABPROT, INR in the last 72 hours.   Urinalysis: No results for input(s): COLORURINE, LABSPEC, PHURINE, GLUCOSEU, HGBUR, BILIRUBINUR, KETONESUR, PROTEINUR, UROBILINOGEN, NITRITE, LEUKOCYTESUR in the last 72 hours.  Invalid input(s): APPERANCEUR    Imaging: CT ANGIO LOW EXTREM LEFT W &/OR WO CONTRAST  Result Date: 09/28/2021 CLINICAL DATA:  Reduction defect, lower limb Soft tissue mass, foot, spontaneous hemorrhage EXAM: CT OF THE LOWER LEFT EXTREMITY WITHOUT CONTRAST TECHNIQUE: Multidetector CT imaging of the lower left extremity was performed according to the standard protocol. COMPARISON:  Radiographs same day FINDINGS: Vascular: Left lower extremity Common iliac artery: Patent  without significant stenosis or aneurysm. Scattered calcific atherosclerosis without significant stenosis. Internal iliac artery: Patent. External iliac artery: Patent without significant stenosis. Common femoral artery: Patent without significant stenosis. Profunda femoral artery: Patent without significant stenosis. Superficial femoral artery: Patent without significant stenosis. Scattered calcific atherosclerosis without significant stenosis. Popliteal artery: Patent without significant stenosis or aneurysm. Tibioperoneal trunk: Patent without significant stenosis. Runoff vessels: Patent without significant stenosis. There is a patent three-vessel runoff to the level of the ankle, scattered calcific atherosclerotic disease without significant stenosis or occlusion appreciated. Nonvascular: Status post amputation of the first and second toes with foci of gas and scattered radiodensities, presumably postsurgical in nature. No drainable organized fluid collection. IMPRESSION: 1. The left lower extremity arteries are patent without significant stenosis. There is a three-vessel runoff to the level of the ankle. No findings of active arterial extravasation or pseudoaneurysm  associated with the surgical bed. 2. Status post amputation of the first and second toes with surgical defect containing foci of gas and antibiotic beads, presumably postsurgical although underlying infection can not be excluded. No drainable fluid collection. Electronically Signed   By: Albin Felling M.D.   On: 09/28/2021 08:45   MR FOOT LEFT WO CONTRAST  Result Date: 09/26/2021 CLINICAL DATA:  Osteomyelitis, foot left foot abscess, MRSA bacteremia EXAM: MRI OF THE LEFT FOOT WITHOUT CONTRAST TECHNIQUE: Multiplanar, multisequence MR imaging of the left foot was performed. No intravenous contrast was administered. COMPARISON:  X-ray 09/22/2021 FINDINGS: Bones/Joint/Cartilage Extensive bone marrow edema with low T1 signal changes involving the  proximal and distal phalanx of the great toe. There is also involvement of the first metatarsal head and distal diaphysis. Serpiginous areas of T2 hyperintense signal within the first metatarsal diaphysis likely indicative of underlying bone infarct. Large complex first MTP joint effusion suggesting septic arthritis. Status post fifth ray resection at the level of the fifth metatarsal base. Preserved signal within the residual fifth metatarsal base. Remaining osseous structures of the foot are within normal limits. No additional sites of bone marrow edema. Ligaments Intact Lisfranc ligament. No evidence of collateral ligament disruption. Muscles and Tendons Chronic denervation changes of the intrinsic foot musculature with probable superimposed myositis. Amputation changes at the lateral forefoot. Mild tenosynovitis involving the flexor and extensor tendons of the great toe distally. Soft tissues Soft tissue ulceration along the medial aspect of the great toe at the level of the first metatarsal head. Underlying contiguous fluid collection measures approximately 5.6 x 4.2 x 4.0 cm, which appears to be contiguous with the first MTP joint space. Diffuse soft tissue edema. IMPRESSION: 1. Soft tissue ulceration at the level of the first metatarsal head. Findings of acute osteomyelitis involving the proximal and distal phalanx of the great toe as well as the first metatarsal head and distal diaphysis. 2. Large complex first MTP joint effusion compatible with septic arthritis. 3. Soft tissue abscess adjacent to the first metatarsal head measuring up to 5.6 cm. 4. Mild tenosynovitis involving the flexor and extensor tendons of the great toe distally. 5. Status post fifth ray resection at the level of the fifth metatarsal base. Preserved signal within the residual fifth metatarsal base. Electronically Signed   By: Davina Poke D.O.   On: 09/26/2021 16:02   DG Foot Complete Left  Result Date: 09/27/2021 CLINICAL  DATA:  Post Op EXAM: LEFT FOOT - COMPLETE 3+ VIEW COMPARISON:  Left foot radiographs 09/22/2021 FINDINGS: Status post amputation of the first digit at the level of the mid/proximal metatarsal. Prior amputation of the fifth digit. No acute osseous finding identified. Small densities within the soft tissues of the medial foot presumably postsurgical. Regional soft tissue swelling. IMPRESSION: Status post amputation of the first digit at the level of the mid/proximal metatarsal. Electronically Signed   By: Audie Pinto M.D.   On: 09/27/2021 10:29     Medications:    sodium chloride     DAPTOmycin (CUBICIN)  IV     dialysis solution 1.5% low-MG/low-CA     dialysis solution 2.5% low-MG/low-CA      sodium chloride   Intravenous Once   abacavir  300 mg Oral BID   carvedilol  12.5 mg Oral BID   Chlorhexidine Gluconate Cloth  6 each Topical Q0600   dolutegravir  50 mg Oral QHS   feeding supplement (NEPRO CARB STEADY)  237 mL Oral TID BM   gentamicin cream  1 application Topical Daily   heparin  5,000 Units Subcutaneous Q8H   insulin aspart  0-6 Units Subcutaneous TID WC   lamiVUDine  25 mg Oral QHS   multivitamin  1 tablet Oral Daily   mupirocin ointment  1 application Nasal BID   potassium chloride  20 mEq Oral Daily   sodium chloride flush  3 mL Intravenous Q12H   sodium chloride, acetaminophen, loperamide, oxyCODONE, sodium chloride flush  Assessment/ Plan:  [name] is a 50 y.o. black male with end stage renal disease on peritoneal dialysis, HIV on HART, hypertension, diabetes mellitus type II, diabetic neuropathy who is admitted to [facility] on 09/22/2021 on Leukocytosis [D72.829] Uremia [N19] Generalized abdominal pain [R10.84] ESRD on peritoneal dialysis ([place]) [N18.6, Z99.2] Acute sepsis ([place]) [A41.9]  [facility] 102.5kg CCPD 9 hours 5 exchanges 2.7 liter fills   End stage renal disease: no indication that patient has peritonitis.  - Continue  peritoneal dialysis. Orders prepared for tonight.   Hypertension:  -Currently on carvedilol.  No changes made.  Monitor.  Anemia with chronic kidney disease: hemoglobin 6.9. normocytic. Mircera as outpatient.  Scheduled EPO dose as inpatient 09/28/21  Diabetes mellitus type II with chronic kidney disease: continue glucose control. Hemoglobin A1c of 6.2%  Hypokalemia -  Improved.  Left foot osteomyelitis with Sepsis with bacteremia and fever: MRSA in blood cultures on 09/22/21.   - Status post amputation on 11/20.  - IV daptomycin.  Patient will receive it as outpatient via IJ PICC line. - appreciate podiatry, ID and cardiology input.     LOS: 4 Annmargaret Decaprio 11/21/20221:13 PM

## 2021-09-29 ENCOUNTER — Inpatient Hospital Stay: Payer: Medicare Other | Admitting: Radiology

## 2021-09-29 DIAGNOSIS — R17 Unspecified jaundice: Secondary | ICD-10-CM | POA: Diagnosis not present

## 2021-09-29 DIAGNOSIS — N2581 Secondary hyperparathyroidism of renal origin: Secondary | ICD-10-CM | POA: Diagnosis not present

## 2021-09-29 DIAGNOSIS — A419 Sepsis, unspecified organism: Secondary | ICD-10-CM | POA: Diagnosis not present

## 2021-09-29 DIAGNOSIS — R7881 Bacteremia: Secondary | ICD-10-CM | POA: Diagnosis not present

## 2021-09-29 DIAGNOSIS — E440 Moderate protein-calorie malnutrition: Secondary | ICD-10-CM | POA: Diagnosis not present

## 2021-09-29 DIAGNOSIS — B20 Human immunodeficiency virus [HIV] disease: Secondary | ICD-10-CM | POA: Diagnosis not present

## 2021-09-29 DIAGNOSIS — D509 Iron deficiency anemia, unspecified: Secondary | ICD-10-CM | POA: Diagnosis not present

## 2021-09-29 DIAGNOSIS — Z992 Dependence on renal dialysis: Secondary | ICD-10-CM | POA: Diagnosis not present

## 2021-09-29 DIAGNOSIS — L02612 Cutaneous abscess of left foot: Secondary | ICD-10-CM

## 2021-09-29 DIAGNOSIS — N186 End stage renal disease: Secondary | ICD-10-CM | POA: Diagnosis not present

## 2021-09-29 DIAGNOSIS — N2589 Other disorders resulting from impaired renal tubular function: Secondary | ICD-10-CM | POA: Diagnosis not present

## 2021-09-29 DIAGNOSIS — D631 Anemia in chronic kidney disease: Secondary | ICD-10-CM | POA: Diagnosis not present

## 2021-09-29 DIAGNOSIS — Z79899 Other long term (current) drug therapy: Secondary | ICD-10-CM | POA: Diagnosis not present

## 2021-09-29 HISTORY — PX: IR US GUIDANCE: IMG2393

## 2021-09-29 HISTORY — PX: IR PERC TUN PERIT CATH WO PORT S&I /IMAG: IMG2327

## 2021-09-29 LAB — BASIC METABOLIC PANEL
Anion gap: 14 (ref 5–15)
BUN: 68 mg/dL — ABNORMAL HIGH (ref 6–20)
CO2: 24 mmol/L (ref 22–32)
Calcium: 8.5 mg/dL — ABNORMAL LOW (ref 8.9–10.3)
Chloride: 95 mmol/L — ABNORMAL LOW (ref 98–111)
Creatinine, Ser: 16.98 mg/dL — ABNORMAL HIGH (ref 0.61–1.24)
GFR, Estimated: 3 mL/min — ABNORMAL LOW (ref >60)
Glucose, Bld: 103 mg/dL — ABNORMAL HIGH (ref 70–99)
Potassium: 4 mmol/L (ref 3.5–5.1)
Sodium: 133 mmol/L — ABNORMAL LOW (ref 135–145)

## 2021-09-29 LAB — CBC
HCT: 21.7 % — ABNORMAL LOW (ref 39.0–52.0)
Hemoglobin: 7.4 g/dL — ABNORMAL LOW (ref 13.0–17.0)
MCH: 28.5 pg (ref 26.0–34.0)
MCHC: 34.1 g/dL (ref 30.0–36.0)
MCV: 83.5 fL (ref 80.0–100.0)
Platelets: 280 10*3/uL (ref 150–400)
RBC: 2.6 MIL/uL — ABNORMAL LOW (ref 4.22–5.81)
RDW: 14.6 % (ref 11.5–15.5)
WBC: 10.2 10*3/uL (ref 4.0–10.5)
nRBC: 0 % (ref 0.0–0.2)

## 2021-09-29 LAB — GLUCOSE, CAPILLARY
Glucose-Capillary: 105 mg/dL — ABNORMAL HIGH (ref 70–99)
Glucose-Capillary: 108 mg/dL — ABNORMAL HIGH (ref 70–99)
Glucose-Capillary: 143 mg/dL — ABNORMAL HIGH (ref 70–99)
Glucose-Capillary: 141 mg/dL — ABNORMAL HIGH (ref 70–99)

## 2021-09-29 MED ORDER — FENTANYL CITRATE (PF) 100 MCG/2ML IJ SOLN
INTRAMUSCULAR | Status: AC
  Filled 2021-09-29: qty 2

## 2021-09-29 MED ORDER — LOSARTAN POTASSIUM 50 MG PO TABS
100.0000 mg | ORAL_TABLET | Freq: Every evening | ORAL | Status: DC
  Administered 2021-09-29 – 2021-10-04 (×6): 100 mg via ORAL
  Filled 2021-09-29 (×6): qty 2

## 2021-09-29 MED ORDER — MIDAZOLAM HCL 2 MG/2ML IJ SOLN
INTRAMUSCULAR | Status: AC | PRN
  Administered 2021-09-29: 1 mg via INTRAVENOUS

## 2021-09-29 MED ORDER — FENTANYL CITRATE (PF) 100 MCG/2ML IJ SOLN
INTRAMUSCULAR | Status: AC | PRN
  Administered 2021-09-29: 50 ug via INTRAVENOUS

## 2021-09-29 MED ORDER — KATE FARMS STANDARD 1.0 PO LIQD
325.0000 mL | Freq: Two times a day (BID) | ORAL | Status: DC
  Filled 2021-09-29 (×10): qty 325

## 2021-09-29 MED ORDER — MIDAZOLAM HCL 2 MG/2ML IJ SOLN
INTRAMUSCULAR | Status: AC
  Filled 2021-09-29: qty 2

## 2021-09-29 MED ORDER — HEPARIN SOD (PORK) LOCK FLUSH 100 UNIT/ML IV SOLN
INTRAVENOUS | Status: AC
  Administered 2021-09-29: 300 [IU]
  Filled 2021-09-29: qty 5

## 2021-09-29 MED ORDER — CARVEDILOL 25 MG PO TABS
25.0000 mg | ORAL_TABLET | Freq: Two times a day (BID) | ORAL | Status: DC
  Administered 2021-09-29 – 2021-10-05 (×12): 25 mg via ORAL
  Filled 2021-09-29 (×13): qty 1

## 2021-09-29 MED ORDER — MINOXIDIL 2.5 MG PO TABS: 2.5000 mg | ORAL_TABLET | ORAL | Status: DC

## 2021-09-29 MED ORDER — KATE FARMS STANDARD 1.0 PO LIQD
325.0000 mL | Freq: Two times a day (BID) | ORAL | Status: DC
  Administered 2021-09-29 – 2021-10-01 (×2): 325 mL via ORAL
  Filled 2021-09-29 (×12): qty 325

## 2021-09-29 MED ORDER — NON FORMULARY: 325.0000 mL | Freq: Two times a day (BID) | Status: DC

## 2021-09-29 MED ORDER — AMLODIPINE BESYLATE 5 MG PO TABS
5.0000 mg | ORAL_TABLET | Freq: Every day | ORAL | Status: DC
  Administered 2021-09-29 – 2021-10-01 (×3): 5 mg via ORAL
  Filled 2021-09-29 (×3): qty 1

## 2021-09-29 MED ORDER — LIDOCAINE HCL 1 % IJ SOLN
INTRAMUSCULAR | Status: AC
  Administered 2021-09-29: 7 mL
  Filled 2021-09-29: qty 20

## 2021-09-29 NOTE — Progress Notes (Signed)
[facility]  ROUNDING NOTE   Subjective:   Patient underwent left first ray amputation and antibiotic beads for his osteomyelitis this admission Today had a left IJ PICC line placed for prolonged antibiotics. Eating lunch sitting up in the bed when seen. Denies any acute complaints No problems reported with PD   Objective:  Vital signs in last 24 hours:  Temp:  [97.9 F (36.6 C)-98.2 F (36.8 C)] 97.9 F (36.6 C) (11/22 1158) Pulse Rate:  [74-78] 74 (11/22 1158) Resp:  [10-20] 18 (11/22 1158) BP: (121-185)/(68-85) 171/77 (11/22 1158) SpO2:  [97 %-100 %] 100 % (11/22 1158) Weight:  [101 kg-101.3 kg] 101 kg (11/22 0500)  Weight change:  Filed Weights   09/24/21 1333 09/28/21 1833 09/29/21 0500  Weight: 104.3 kg 101.3 kg 101 kg    Intake/Output: I/O last 3 completed shifts: In: 356 [Blood:290; IV Piggyback:66] Out: 1 [Stool:1]   Intake/Output this shift:  No intake/output data recorded.  Physical Exam: General: NAD, sitting up in bed  Head: Normocephalic, atraumatic. Moist oral mucosal membranes  Lungs:  Normal effort on room air  Heart: Regular rate and rhythm  Abdomen:  Soft, nontender, PD catheter  Extremities: No peripheral edema. Right foot dressings clean, dry and intact  Neurologic: Nonfocal, moving all four extremities  Skin: No lesions  Access: PD catheter, right radiocephalic AVF    Basic Metabolic Panel: Recent Labs  Lab 09/24/21 0552 09/26/21 0455 09/27/21 0459 09/28/21 0411 09/29/21 0815  NA 133* 134* 133* 131* 133*  K 3.4* 3.6 3.6 3.9 4.0  CL 94* 95* 94* 94* 95*  CO2 22 24 23 26 24   GLUCOSE 215* 224* 172* 120* 103*  BUN 97* 89* 88* 75* 68*  CREATININE 20.61* 18.71* 18.37* 17.06* 16.98*  CALCIUM 8.7* 8.7* 8.8* 8.4* 8.5*     Liver Function Tests: Recent Labs  Lab 09/23/21 0445  AST 14*  ALT 14  ALKPHOS 73  BILITOT 0.7  PROT 7.9  ALBUMIN 2.4*    No results for input(s): LIPASE, AMYLASE in the last 168 hours. No  results for input(s): AMMONIA in the last 168 hours.  CBC: Recent Labs  Lab 09/23/21 0643 09/24/21 0552 09/26/21 0455 09/27/21 0459 09/28/21 0411 09/29/21 0815  WBC 25.7* 21.4* 18.1* 13.9* 11.1* 10.2  NEUTROABS 21.7*  --   --   --   --   --   HGB 9.1* 8.5* 7.9* 7.8* 6.9* 7.4*  HCT 25.6* 24.4* 23.7* 22.9* 19.9* 21.7*  MCV 82 82.2 84.3 83.3 84.0 83.5  PLT 330 297 309 340 303 280     Cardiac Enzymes: Recent Labs  Lab 09/28/21 0411  CKTOTAL 70     BNP: Invalid input(s): POCBNP  CBG: Recent Labs  Lab 09/28/21 1143 09/28/21 1639 09/28/21 2221 09/29/21 0810 09/29/21 1309  GLUCAP 120* 130* 120* 105* 108*     Microbiology: Results for orders placed or performed during the hospital encounter of 09/22/21  Resp Panel by RT-PCR (Flu A&B, Covid) Nasopharyngeal Swab     Status: None   Collection Time: 09/22/21 11:30 AM   Specimen: Nasopharyngeal Swab; Nasopharyngeal(NP) swabs in vial transport medium  Result Value Ref Range Status   SARS Coronavirus 2 by RT PCR NEGATIVE NEGATIVE Final    Comment: (NOTE) SARS-CoV-2 target nucleic acids are NOT DETECTED.  The SARS-CoV-2 RNA is generally detectable in upper respiratory specimens during the acute phase of infection. The lowest concentration of SARS-CoV-2 viral copies this assay can detect is 138 copies/mL. A negative  result does not preclude SARS-Cov-2 infection and should not be used as the sole basis for treatment or other patient management decisions. A negative result may occur with  improper specimen collection/handling, submission of specimen other than nasopharyngeal swab, presence of viral mutation(s) within the areas targeted by this assay, and inadequate number of viral copies(<138 copies/mL). A negative result must be combined with clinical observations, patient history, and epidemiological information. The expected result is Negative.  Fact Sheet for Patients:   EntrepreneurPulse.com.au  Fact Sheet for Healthcare Providers:  IncredibleEmployment.be  This test is no t yet approved or cleared by the [place] FDA and  has been authorized for detection and/or diagnosis of SARS-CoV-2 by FDA under an Emergency Use Authorization (EUA). This EUA will remain  in effect (meaning this test can be used) for the duration of the COVID-19 declaration under Section 564(b)(1) of the Act, 21 U.S.C.section 360bbb-3(b)(1), unless the authorization is terminated  or revoked sooner.       Influenza A by PCR NEGATIVE NEGATIVE Final   Influenza B by PCR NEGATIVE NEGATIVE Final    Comment: (NOTE) The Xpert Xpress SARS-CoV-2/FLU/RSV plus assay is intended as an aid in the diagnosis of influenza from Nasopharyngeal swab specimens and should not be used as a sole basis for treatment. Nasal washings and aspirates are unacceptable for Xpert Xpress SARS-CoV-2/FLU/RSV testing.  Fact Sheet for Patients: EntrepreneurPulse.com.au  Fact Sheet for Healthcare Providers: IncredibleEmployment.be  This test is not yet approved or cleared by the [place] FDA and has been authorized for detection and/or diagnosis of SARS-CoV-2 by FDA under an Emergency Use Authorization (EUA). This EUA will remain in effect (meaning this test can be used) for the duration of the COVID-19 declaration under Section 564(b)(1) of the Act, 21 U.S.C. section 360bbb-3(b)(1), unless the authorization is terminated or revoked.  Performed at [facility], [place], [place] [address]   Respiratory (~20 pathogens) panel by PCR     Status: None   Collection Time: 09/22/21 11:30 AM   Specimen: Peritoneal Washings; Respiratory  Result Value Ref Range Status   Adenovirus NOT DETECTED NOT DETECTED Final   Coronavirus 229E NOT DETECTED NOT DETECTED Final    Comment: (NOTE) The Coronavirus on the  Respiratory Panel, DOES NOT test for the novel  Coronavirus (2019 nCoV)    Coronavirus HKU1 NOT DETECTED NOT DETECTED Final   Coronavirus NL63 NOT DETECTED NOT DETECTED Final   Coronavirus OC43 NOT DETECTED NOT DETECTED Final   Metapneumovirus NOT DETECTED NOT DETECTED Final   Rhinovirus / Enterovirus NOT DETECTED NOT DETECTED Final   Influenza A NOT DETECTED NOT DETECTED Final   Influenza B NOT DETECTED NOT DETECTED Final   Parainfluenza Virus 1 NOT DETECTED NOT DETECTED Final   Parainfluenza Virus 2 NOT DETECTED NOT DETECTED Final   Parainfluenza Virus 3 NOT DETECTED NOT DETECTED Final   Parainfluenza Virus 4 NOT DETECTED NOT DETECTED Final   Respiratory Syncytial Virus NOT DETECTED NOT DETECTED Final   Bordetella pertussis NOT DETECTED NOT DETECTED Final   Bordetella Parapertussis NOT DETECTED NOT DETECTED Final   Chlamydophila pneumoniae NOT DETECTED NOT DETECTED Final   Mycoplasma pneumoniae NOT DETECTED NOT DETECTED Final    Comment: Performed at [facility], [place]. [address] [place], [place] [address]  Blood Culture (routine x 2)     Status: Abnormal   Collection Time: 09/22/21  1:32 PM   Specimen: BLOOD  Result Value Ref Range Status   Specimen Description  Final    BLOOD LEFT ANTECUBITAL Performed at [facility], [address] [place], [place] [address]    Special Requests   Final    BOTTLES DRAWN AEROBIC AND ANAEROBIC Blood Culture results may not be optimal due to an excessive volume of blood received in culture bottles Performed at [facility], [address], [place], [place] [address]    Culture  Setup Time   Final    GRAM POSITIVE COCCI ANAEROBIC BOTTLE ONLY CRITICAL VALUE NOTED.  VALUE IS CONSISTENT WITH PREVIOUSLY REPORTED AND CALLED VALUE.    Culture (A)  Final    STAPHYLOCOCCUS AUREUS SUSCEPTIBILITIES PERFORMED ON PREVIOUS CULTURE WITHIN THE LAST 5 DAYS. Performed at [facility], [place] [address] [place], [place]  [address]    Report Status 09/27/2021 FINAL  Final  Blood Culture (routine x 2)     Status: Abnormal   Collection Time: 09/22/21  1:32 PM   Specimen: BLOOD  Result Value Ref Range Status   Specimen Description   Final    BLOOD LEFT ANTECUBITAL Performed at [facility], [address], [place], [place] [address]    Special Requests   Final    BOTTLES DRAWN AEROBIC AND ANAEROBIC Blood Culture adequate volume Performed at [facility], [address] [place], [place] [address]    Culture  Setup Time   Final    GRAM POSITIVE COCCI IN BOTH AEROBIC AND ANAEROBIC BOTTLES Organism ID to follow CRITICAL RESULT CALLED TO, READ BACK BY AND VERIFIED WITH: Ardeen Garland, PHARMD AT 0725 ON 09/23/21 BY GM Performed at [facility], [address] [place], [place] [address]    Culture METHICILLIN RESISTANT STAPHYLOCOCCUS AUREUS (A)  Final   Report Status 09/25/2021 FINAL  Final   Organism ID, Bacteria METHICILLIN RESISTANT STAPHYLOCOCCUS AUREUS  Final      Susceptibility   Methicillin resistant staphylococcus aureus - MIC*    CIPROFLOXACIN 2 INTERMEDIATE Intermediate     ERYTHROMYCIN >=8 RESISTANT Resistant     GENTAMICIN <=0.5 SENSITIVE Sensitive     OXACILLIN >=4 RESISTANT Resistant     TETRACYCLINE <=1 SENSITIVE Sensitive     VANCOMYCIN 1 SENSITIVE Sensitive     TRIMETH/SULFA <=10 SENSITIVE Sensitive     CLINDAMYCIN <=0.25 SENSITIVE Sensitive     RIFAMPIN <=0.5 SENSITIVE Sensitive     Inducible Clindamycin NEGATIVE Sensitive     * METHICILLIN RESISTANT STAPHYLOCOCCUS AUREUS  Blood Culture ID Panel (Reflexed)     Status: Abnormal   Collection Time: 09/22/21  1:32 PM  Result Value Ref Range Status   Enterococcus faecalis NOT DETECTED NOT DETECTED Final   Enterococcus Faecium NOT DETECTED NOT DETECTED Final   Listeria monocytogenes NOT DETECTED NOT DETECTED Final   Staphylococcus species DETECTED (A) NOT DETECTED Final    Comment: CRITICAL RESULT CALLED TO,  READ BACK BY AND VERIFIED WITH: MORGAN HICKS, PHARMD AT 0725 ON 09/23/21 BY GM    Staphylococcus aureus (BCID) DETECTED (A) NOT DETECTED Final    Comment: Methicillin (oxacillin)-resistant Staphylococcus aureus (MRSA). MRSA is predictably resistant to beta-lactam antibiotics (except ceftaroline). Preferred therapy is vancomycin unless clinically contraindicated. Patient requires contact precautions if  hospitalized. CRITICAL RESULT CALLED TO, READ BACK BY AND VERIFIED WITH: MORGAN HICKS, PHARMD AT 0725 ON 09/23/21 BY GM    Staphylococcus epidermidis NOT DETECTED NOT DETECTED Final   Staphylococcus lugdunensis NOT DETECTED NOT DETECTED Final   Streptococcus species NOT DETECTED NOT DETECTED Final   Streptococcus agalactiae NOT DETECTED  NOT DETECTED Final   Streptococcus pneumoniae NOT DETECTED NOT DETECTED Final   Streptococcus pyogenes NOT DETECTED NOT DETECTED Final   A.calcoaceticus-baumannii NOT DETECTED NOT DETECTED Final   Bacteroides fragilis NOT DETECTED NOT DETECTED Final   Enterobacterales NOT DETECTED NOT DETECTED Final   Enterobacter cloacae complex NOT DETECTED NOT DETECTED Final   Escherichia coli NOT DETECTED NOT DETECTED Final   Klebsiella aerogenes NOT DETECTED NOT DETECTED Final   Klebsiella oxytoca NOT DETECTED NOT DETECTED Final   Klebsiella pneumoniae NOT DETECTED NOT DETECTED Final   Proteus species NOT DETECTED NOT DETECTED Final   Salmonella species NOT DETECTED NOT DETECTED Final   Serratia marcescens NOT DETECTED NOT DETECTED Final   Haemophilus influenzae NOT DETECTED NOT DETECTED Final   Neisseria meningitidis NOT DETECTED NOT DETECTED Final   Pseudomonas aeruginosa NOT DETECTED NOT DETECTED Final   Stenotrophomonas maltophilia NOT DETECTED NOT DETECTED Final   Candida albicans NOT DETECTED NOT DETECTED Final   Candida auris NOT DETECTED NOT DETECTED Final   Candida glabrata NOT DETECTED NOT DETECTED Final   Candida krusei NOT DETECTED NOT DETECTED Final    Candida parapsilosis NOT DETECTED NOT DETECTED Final   Candida tropicalis NOT DETECTED NOT DETECTED Final   Cryptococcus neoformans/gattii NOT DETECTED NOT DETECTED Final   Meth resistant mecA/C and MREJ DETECTED (A) NOT DETECTED Final    Comment: CRITICAL RESULT CALLED TO, READ BACK BY AND VERIFIED WITH: Ardeen Garland, PHARMD AT 0725 ON 09/23/21 BY GM Performed at [facility], [place], [place] [address]   Group A Strep by PCR [facility] Only)     Status: None   Collection Time: 09/22/21  1:40 PM   Specimen: Throat; Sterile Swab  Result Value Ref Range Status   Group A Strep by PCR NOT DETECTED NOT DETECTED Final    Comment: Performed at [facility], [place] [place], [place] [address]  Peritoneal fluid culture w Gram Stain     Status: None   Collection Time: 09/22/21  7:29 PM   Specimen: Peritoneal Washings; Peritoneal Fluid  Result Value Ref Range Status   Specimen Description   Final    PERITONEAL Performed at [facility], [address] [place], [place] [address]    Special Requests   Final    NONE Performed at [facility], [address] [place],  [address]    Gram Stain   Final    FEW WBC PRESENT, PREDOMINANTLY MONONUCLEAR NO ORGANISMS SEEN    Culture   Final    NO GROWTH 3 DAYS Performed at [facility], [place] [address], [place],  [address]    Report Status 09/26/2021 FINAL  Final  Gastrointestinal Panel by PCR , Stool     Status: None   Collection Time: 09/23/21  4:45 AM   Specimen: Stool  Result Value Ref Range Status   Campylobacter species NOT DETECTED NOT DETECTED Final   Plesimonas shigelloides NOT DETECTED NOT DETECTED Final   Salmonella species NOT DETECTED NOT DETECTED Final   Yersinia enterocolitica NOT DETECTED NOT DETECTED Final   Vibrio species NOT DETECTED NOT DETECTED Final   Vibrio cholerae NOT DETECTED NOT DETECTED Final   Enteroaggregative E coli (EAEC) NOT DETECTED  NOT DETECTED Final   Enteropathogenic E coli (EPEC) NOT DETECTED NOT DETECTED Final   Enterotoxigenic E coli (ETEC) NOT DETECTED NOT DETECTED Final   Shiga like toxin producing E coli (STEC) NOT DETECTED NOT DETECTED Final  Shigella/Enteroinvasive E coli (EIEC) NOT DETECTED NOT DETECTED Final   Cryptosporidium NOT DETECTED NOT DETECTED Final   Cyclospora cayetanensis NOT DETECTED NOT DETECTED Final   Entamoeba histolytica NOT DETECTED NOT DETECTED Final   Giardia lamblia NOT DETECTED NOT DETECTED Final   Adenovirus F40/41 NOT DETECTED NOT DETECTED Final   Astrovirus NOT DETECTED NOT DETECTED Final   Norovirus GI/GII NOT DETECTED NOT DETECTED Final   Rotavirus A NOT DETECTED NOT DETECTED Final   Sapovirus (I, II, IV, and V) NOT DETECTED NOT DETECTED Final    Comment: Performed at [facility], [address], [place], [place] [address]  C Difficile Quick Screen w PCR reflex     Status: None   Collection Time: 09/23/21  4:45 AM   Specimen: Stool  Result Value Ref Range Status   C Diff antigen NEGATIVE NEGATIVE Final   C Diff toxin NEGATIVE NEGATIVE Final   C Diff interpretation No C. difficile detected.  Final    Comment: Performed at [facility], [address] [place], [place] [address]  CULTURE, BLOOD (ROUTINE X 2) w Reflex to ID Panel     Status: None (Preliminary result)   Collection Time: 09/25/21  6:12 AM   Specimen: BLOOD  Result Value Ref Range Status   Specimen Description BLOOD LEFT ARM  Final   Special Requests   Final    BOTTLES DRAWN AEROBIC AND ANAEROBIC Blood Culture adequate volume   Culture   Final    NO GROWTH 4 DAYS Performed at [facility], [address] [place], [place] [address]    Report Status PENDING  Incomplete  CULTURE, BLOOD (ROUTINE X 2) w Reflex to ID Panel     Status: None (Preliminary result)   Collection Time: 09/25/21  6:12 AM   Specimen: BLOOD  Result Value Ref Range Status   Specimen Description BLOOD RIGHT  HAND  Final   Special Requests   Final    BOTTLES DRAWN AEROBIC AND ANAEROBIC Blood Culture adequate volume   Culture   Final    NO GROWTH 4 DAYS Performed at [facility], [address] [place], [place] [address]    Report Status PENDING  Incomplete  Aerobic/Anaerobic Culture w Gram Stain (surgical/deep wound)     Status: None (Preliminary result)   Collection Time: 09/26/21  2:58 PM   Specimen: Foot; Wound  Result Value Ref Range Status   Specimen Description   Final    FOOT LEFT Performed at [facility], [address] [place], [place] [address]    Special Requests   Final    NONE Performed at [facility], [place], [place] [address]    Gram Stain   Final    NO SQUAMOUS EPITHELIAL CELLS SEEN FEW WBC SEEN FEW GRAM POSITIVE COCCI Performed at [facility], [place] [address], [place],  [address]    Culture   Final    MODERATE METHICILLIN RESISTANT STAPHYLOCOCCUS AUREUS NO ANAEROBES ISOLATED; CULTURE IN PROGRESS FOR 5 DAYS    Report Status PENDING  Incomplete   Organism ID, Bacteria METHICILLIN RESISTANT STAPHYLOCOCCUS AUREUS  Final      Susceptibility   Methicillin resistant staphylococcus aureus - MIC*    CIPROFLOXACIN 2 INTERMEDIATE Intermediate     ERYTHROMYCIN >=8 RESISTANT Resistant     GENTAMICIN <=0.5 SENSITIVE Sensitive     OXACILLIN >=4 RESISTANT Resistant     TETRACYCLINE <=1 SENSITIVE Sensitive     VANCOMYCIN  1 SENSITIVE Sensitive     TRIMETH/SULFA <=10 SENSITIVE Sensitive     CLINDAMYCIN <=0.25 SENSITIVE Sensitive     RIFAMPIN <=0.5 SENSITIVE Sensitive     Inducible Clindamycin NEGATIVE Sensitive     * MODERATE METHICILLIN RESISTANT STAPHYLOCOCCUS AUREUS  Surgical PCR screen     Status: Abnormal   Collection Time: 09/27/21  4:43 AM   Specimen: Nasal Mucosa; Nasal Swab  Result Value Ref Range Status   MRSA, PCR POSITIVE (A) NEGATIVE Final    Comment: RESULT CALLED TO, READ BACK BY AND VERIFIED  WITH: CHARLOTTE KYEI AT 6720 ON 09/27/21 [place].    Staphylococcus aureus POSITIVE (A) NEGATIVE Final    Comment: (NOTE) The Xpert SA Assay (FDA approved for NASAL specimens in patients 32 years of age and older), is one component of a comprehensive surveillance program. It is not intended to diagnose infection nor to guide or monitor treatment. Performed at [facility], [address] [place], [place] [address]   Aerobic/Anaerobic Culture w Gram Stain (surgical/deep wound)     Status: None (Preliminary result)   Collection Time: 09/27/21  9:17 AM   Specimen: PATH Digit amputation; Tissue  Result Value Ref Range Status   Specimen Description   Final    WOUND Performed at [facility], [address] [place], [place] [address]    Special Requests   Final    DIGIT Performed at [facility], [place], [place] [address]    Gram Stain   Final    NO SQUAMOUS EPITHELIAL CELLS SEEN FEW WBC SEEN FEW GRAM POSITIVE COCCI Performed at [facility], [place] [address] [place], [place] [address]    Culture FEW METHICILLIN RESISTANT STAPHYLOCOCCUS AUREUS  Final   Report Status PENDING  Incomplete   Organism ID, Bacteria METHICILLIN RESISTANT STAPHYLOCOCCUS AUREUS  Final      Susceptibility   Methicillin resistant staphylococcus aureus - MIC*    CIPROFLOXACIN 2 INTERMEDIATE Intermediate     ERYTHROMYCIN >=8 RESISTANT Resistant     GENTAMICIN <=0.5 SENSITIVE Sensitive     OXACILLIN >=4 RESISTANT Resistant     TETRACYCLINE <=1 SENSITIVE Sensitive     VANCOMYCIN <=0.5 SENSITIVE Sensitive     TRIMETH/SULFA <=10 SENSITIVE Sensitive     CLINDAMYCIN <=0.25 SENSITIVE Sensitive     RIFAMPIN <=0.5 SENSITIVE Sensitive     Inducible Clindamycin NEGATIVE Sensitive     * FEW METHICILLIN RESISTANT STAPHYLOCOCCUS AUREUS    Coagulation Studies: No results for input(s): LABPROT, INR in the last 72 hours.   Urinalysis: No results for input(s):  COLORURINE, LABSPEC, PHURINE, GLUCOSEU, HGBUR, BILIRUBINUR, KETONESUR, PROTEINUR, UROBILINOGEN, NITRITE, LEUKOCYTESUR in the last 72 hours.  Invalid input(s): APPERANCEUR    Imaging: CT ANGIO LOW EXTREM LEFT W &/OR WO CONTRAST  Result Date: 09/28/2021 CLINICAL DATA:  Reduction defect, lower limb Soft tissue mass, foot, spontaneous hemorrhage EXAM: CT OF THE LOWER LEFT EXTREMITY WITHOUT CONTRAST TECHNIQUE: Multidetector CT imaging of the lower left extremity was performed according to the standard protocol. COMPARISON:  Radiographs same day FINDINGS: Vascular: Left lower extremity Common iliac artery: Patent without significant stenosis or aneurysm. Scattered calcific atherosclerosis without significant stenosis. Internal iliac artery: Patent. External iliac artery: Patent without significant stenosis. Common femoral artery: Patent without significant stenosis. Profunda femoral artery: Patent without significant stenosis. Superficial femoral artery: Patent without significant stenosis. Scattered calcific atherosclerosis without significant stenosis. Popliteal artery: Patent without significant stenosis or aneurysm. Tibioperoneal trunk: Patent without significant stenosis. Runoff vessels:  Patent without significant stenosis. There is a patent three-vessel runoff to the level of the ankle, scattered calcific atherosclerotic disease without significant stenosis or occlusion appreciated. Nonvascular: Status post amputation of the first and second toes with foci of gas and scattered radiodensities, presumably postsurgical in nature. No drainable organized fluid collection. IMPRESSION: 1. The left lower extremity arteries are patent without significant stenosis. There is a three-vessel runoff to the level of the ankle. No findings of active arterial extravasation or pseudoaneurysm associated with the surgical bed. 2. Status post amputation of the first and second toes with surgical defect containing foci of gas  and antibiotic beads, presumably postsurgical although underlying infection can not be excluded. No drainable fluid collection. Electronically Signed   By: Albin Felling M.D.   On: 09/28/2021 08:45   IR TUNNELED CENTRAL VENOUS CATHETER PLACEMENT  Result Date: 09/29/2021 CLINICAL DATA:  Infection of left foot with osteomyelitis of the first toe requiring amputation. The patient requires a tunneled central venous catheter for home IV antibiotics and has a history of end-stage renal disease and previous right arm AV fistula placement. EXAM: TUNNELED CENTRAL VENOUS CATHETER PLACEMENT WITH ULTRASOUND AND FLUOROSCOPIC GUIDANCE ANESTHESIA/SEDATION: Moderate (conscious) sedation was employed during this procedure. A total of Versed 1.0 mg and Fentanyl 50 mcg was administered intravenously by radiology nursing under my supervision. Moderate Sedation Time: 18 minutes. The patient's level of consciousness and vital signs were monitored continuously by radiology nursing throughout the procedure under my direct supervision. MEDICATIONS: None FLUOROSCOPY TIME:  24 seconds.  3.7 mGy. PROCEDURE: The procedure, risks, benefits, and alternatives were explained to the patient. Questions regarding the procedure were encouraged and answered. The patient understands and consents to the procedure. A timeout was performed prior to initiating the procedure. Ultrasound was used to check for patency of the internal jugular veins bilaterally. Ultrasound image recording was performed of the left internal jugular vein. Left neck and chest were prepped with chlorhexidine in a sterile fashion, and a sterile drape was applied covering the operative field. Maximum barrier sterile technique with sterile gowns and gloves were used for the procedure. Local anesthesia was provided with 1% lidocaine. After creating a small venotomy incision, a 21 gauge needle was advanced into the left internal jugular vein under direct, real-time ultrasound  guidance with permanent image recording. Ultrasound image documentation was performed. After securing guidewire access, a 6 French peel-away sheath was placed. A wire was kinked to measure appropriate catheter length. A 5 French, single-lumen tunneled power line was chosen for placement. This was tunneled in a retrograde fashion from the chest wall to the venotomy incision. The catheter was cut to 25 cm. The catheter was then placed through the sheath and the sheath removed. Final catheter positioning was confirmed and documented with a fluoroscopic spot image. The catheter was aspirated, flushed with saline and flushed with heparinized saline. The venotomy incision was closed with subcuticular 4-0 Vicryl. Dermabond was applied to the incision. The catheter exit site was secured with Prolene retention sutures. COMPLICATIONS: None.  No pneumothorax. 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 Patent right external jugular vein is present. The left internal jugular vein is normally patent. After catheter placement, the tip lies in the SVC. The catheter aspirates normally and is ready for immediate use. IMPRESSION: Placement of tunneled central venous catheter via the left internal jugular vein. The catheter tip lies in the SVC. The catheter is ready for immediate  use. 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 Electronically Signed   By: Aletta Edouard M.D.   On: 09/29/2021 12:42     Medications:    sodium chloride     DAPTOmycin (CUBICIN)  IV Stopped (09/28/21 1550)   dialysis solution 1.5% low-MG/low-CA     dialysis solution 2.5% low-MG/low-CA      sodium chloride   Intravenous Once   abacavir  300 mg Oral BID   carvedilol  12.5 mg Oral BID   Chlorhexidine Gluconate Cloth  6 each Topical Q0600   dolutegravir  50 mg Oral QHS   feeding supplement  (NEPRO CARB STEADY)  237 mL Oral TID BM   gentamicin cream  1 application Topical Daily   heparin  5,000 Units Subcutaneous Q8H   insulin aspart  0-6 Units Subcutaneous TID WC   lamiVUDine  25 mg Oral QHS   multivitamin  1 tablet Oral Daily   mupirocin ointment  1 application Nasal BID   potassium chloride  20 mEq Oral Daily   sodium chloride flush  3 mL Intravenous Q12H   sodium chloride, acetaminophen, loperamide, oxyCODONE, sodium chloride flush  Assessment/ Plan:  Nathan Ayers is a 50 y.o. black male with end stage renal disease on peritoneal dialysis, HIV on HART, hypertension, diabetes mellitus type II, diabetic neuropathy who is admitted to [facility] on 09/22/2021 on Leukocytosis [D72.829] Uremia [N19] Generalized abdominal pain [R10.84] ESRD on peritoneal dialysis ([place]) [N18.6, Z99.2] Acute sepsis ([place]) [A41.9]  [facility] 102.5kg CCPD 9 hours 5 exchanges 2.7 liter fills   End stage renal disease: no indication that patient has peritonitis.  - Continue peritoneal dialysis.  -Patient will need follow-up with outpatient PD department at discharge to replace the hospital transfer set on the PD catheter.  Hypertension:  -Currently on carvedilol.  BP trending higher - restart Losartan  Anemia with chronic kidney disease: hemoglobin 7.4. normocytic. Mircera as outpatient.  Scheduled EPO dose as inpatient 09/28/21  Diabetes mellitus type II with chronic kidney disease: continue glucose control. Hemoglobin A1c of 6.2%  Hypokalemia -  Improved.  Left foot osteomyelitis with Sepsis with bacteremia and fever: MRSA in blood cultures on 09/22/21.   - Status post amputation on 11/20.  - IV daptomycin x 6 weeks. Recommended dose 800 mg iv every 48 hrs.End date 11/01/2021 Patient will receive it as outpatient via IJ PICC line. - appreciate podiatry, ID and cardiology input.     LOS: [place] 11/22/20221:50 PM

## 2021-09-29 NOTE — Progress Notes (Signed)
Date of Admission:  09/22/2021      ID: Nathan Ayers is a 50 y.o. male Principal Problem:   Leukocytosis Active Problems:   Malnutrition of moderate degree   Type 1 diabetes ([place])   Essential hypertension   HIV disease ([place])   ESRD on dialysis ([place])   OSA (obstructive sleep apnea)    Subjective: Doing much better No fever Left leg no pain Had central line placement  Medications:   sodium chloride   Intravenous Once   abacavir  300 mg Oral BID   amLODipine  5 mg Oral Daily   carvedilol  25 mg Oral BID   Chlorhexidine Gluconate Cloth  6 each Topical Q0600   dolutegravir  50 mg Oral QHS   gentamicin cream  1 application Topical Daily   heparin  5,000 Units Subcutaneous Q8H   insulin aspart  0-6 Units Subcutaneous TID WC   [employer] Standard 1.0  325 mL Oral BID   lamiVUDine  25 mg Oral QHS   losartan  100 mg Oral QPM   multivitamin  1 tablet Oral Daily   mupirocin ointment  1 application Nasal BID   potassium chloride  20 mEq Oral Daily   sodium chloride flush  3 mL Intravenous Q12H    Objective: Vital signs in last 24 hours: Temp:  [97.7 F (36.5 C)-98.2 F (36.8 C)] 97.7 F (36.5 C) (11/22 1516) Pulse Rate:  [74-77] 75 (11/22 1516) Resp:  [10-20] 19 (11/22 1516) BP: (120-185)/(65-85) 120/65 (11/22 1516) SpO2:  [97 %-100 %] 99 % (11/22 1516) Weight:  [101 kg-101.3 kg] 101 kg (11/22 0500)  PHYSICAL EXAM:  General: Alert, cooperative, no distress, appears stated age.  Left IJ line Lungs: Clear to auscultation bilaterally. No Wheezing or Rhonchi. No rales. Heart: Regular rate and rhythm, no murmur, rub or gallop. Abdomen: Soft, non-tender,not distended. Bowel sounds normal. No masses Extremities: left foot   Skin: No rashes or lesions. Or bruising Lymph: Cervical, supraclavicular normal. Neurologic: Grossly non-focal  Lab Results Recent Labs    09/28/21 0411 09/29/21 0815  WBC 11.1* 10.2  HGB 6.9* 7.4*  HCT 19.9* 21.7*  NA 131* 133*  K 3.9  4.0  CL 94* 95*  CO2 26 24  BUN 75* 68*  CREATININE 17.06* 16.98*   Liver Panel No results for input(s): PROT, ALBUMIN, AST, ALT, ALKPHOS, BILITOT, BILIDIR, IBILI in the last 72 hours. Sedimentation Rate No results for input(s): ESRSEDRATE in the last 72 hours. C-Reactive Protein No results for input(s): CRP in the last 72 hours.  Microbiology:  Studies/Results: IR US Guidance  Result Date: 09/29/2021 CLINICAL DATA:  Infection of left foot with osteomyelitis of the first toe requiring amputation. The patient requires a tunneled central venous catheter for home IV antibiotics and has a history of end-stage renal disease and previous right arm AV fistula placement. EXAM: TUNNELED CENTRAL VENOUS CATHETER PLACEMENT WITH ULTRASOUND AND FLUOROSCOPIC GUIDANCE ANESTHESIA/SEDATION: Moderate (conscious) sedation was employed during this procedure. A total of Versed 1.0 mg and Fentanyl 50 mcg was administered intravenously by radiology nursing under my supervision. Moderate Sedation Time: 18 minutes. The patient's level of consciousness and vital signs were monitored continuously by radiology nursing throughout the procedure under my direct supervision. MEDICATIONS: None FLUOROSCOPY TIME:  24 seconds.  3.7 mGy. PROCEDURE: The procedure, risks, benefits, and alternatives were explained to the patient. Questions regarding the procedure were encouraged and answered. The patient understands and consents to the procedure. A timeout was performed prior  to initiating the procedure. Ultrasound was used to check for patency of the internal jugular veins bilaterally. Ultrasound image recording was performed of the left internal jugular vein. Left neck and chest were prepped with chlorhexidine in a sterile fashion, and a sterile drape was applied covering the operative field. Maximum barrier sterile technique with sterile gowns and gloves were used for the procedure. Local anesthesia was provided with 1% lidocaine.  After creating a small venotomy incision, a 21 gauge needle was advanced into the left internal jugular vein under direct, real-time ultrasound guidance with permanent image recording. Ultrasound image documentation was performed. After securing guidewire access, a 6 French peel-away sheath was placed. A wire was kinked to measure appropriate catheter length. A 5 French, single-lumen tunneled power line was chosen for placement. This was tunneled in a retrograde fashion from the chest wall to the venotomy incision. The catheter was cut to 25 cm. The catheter was then placed through the sheath and the sheath removed. Final catheter positioning was confirmed and documented with a fluoroscopic spot image. The catheter was aspirated, flushed with saline and flushed with heparinized saline. The venotomy incision was closed with subcuticular 4-0 Vicryl. Dermabond was applied to the incision. The catheter exit site was secured with Prolene retention sutures. COMPLICATIONS: None.  No pneumothorax. 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 Patent right external jugular vein is present. The left internal jugular vein is normally patent. After catheter placement, the tip lies in the SVC. The catheter aspirates normally and is ready for immediate use. IMPRESSION: Placement of tunneled central venous catheter via the left internal jugular vein. The catheter tip lies in the SVC. The catheter is ready for immediate use. 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 Electronically Signed   By: Aletta Edouard M.D.   On: 09/29/2021 16:20   IR TUNNELED CENTRAL VENOUS CATHETER PLACEMENT  Result Date: 09/29/2021 CLINICAL DATA:  Infection of left foot with osteomyelitis of the first toe requiring amputation. The patient requires a tunneled central venous catheter for home IV  antibiotics and has a history of end-stage renal disease and previous right arm AV fistula placement. EXAM: TUNNELED CENTRAL VENOUS CATHETER PLACEMENT WITH ULTRASOUND AND FLUOROSCOPIC GUIDANCE ANESTHESIA/SEDATION: Moderate (conscious) sedation was employed during this procedure. A total of Versed 1.0 mg and Fentanyl 50 mcg was administered intravenously by radiology nursing under my supervision. Moderate Sedation Time: 18 minutes. The patient's level of consciousness and vital signs were monitored continuously by radiology nursing throughout the procedure under my direct supervision. MEDICATIONS: None FLUOROSCOPY TIME:  24 seconds.  3.7 mGy. PROCEDURE: The procedure, risks, benefits, and alternatives were explained to the patient. Questions regarding the procedure were encouraged and answered. The patient understands and consents to the procedure. A timeout was performed prior to initiating the procedure. Ultrasound was used to check for patency of the internal jugular veins bilaterally. Ultrasound image recording was performed of the left internal jugular vein. Left neck and chest were prepped with chlorhexidine in a sterile fashion, and a sterile drape was applied covering the operative field. Maximum barrier sterile technique with sterile gowns and gloves were used for the procedure. Local anesthesia was provided with 1% lidocaine. After creating a small venotomy incision, a 21 gauge needle was advanced into the left internal jugular vein under direct, real-time ultrasound guidance with permanent image recording. Ultrasound image documentation was performed. After securing guidewire access, a 6  French peel-away sheath was placed. A wire was kinked to measure appropriate catheter length. A 5 French, single-lumen tunneled power line was chosen for placement. This was tunneled in a retrograde fashion from the chest wall to the venotomy incision. The catheter was cut to 25 cm. The catheter was then placed through the  sheath and the sheath removed. Final catheter positioning was confirmed and documented with a fluoroscopic spot image. The catheter was aspirated, flushed with saline and flushed with heparinized saline. The venotomy incision was closed with subcuticular 4-0 Vicryl. Dermabond was applied to the incision. The catheter exit site was secured with Prolene retention sutures. COMPLICATIONS: None.  No pneumothorax. 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 Patent right external jugular vein is present. The left internal jugular vein is normally patent. After catheter placement, the tip lies in the SVC. The catheter aspirates normally and is ready for immediate use. IMPRESSION: Placement of tunneled central venous catheter via the left internal jugular vein. The catheter tip lies in the SVC. The catheter is ready for immediate use. 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 Electronically Signed   By: Aletta Edouard M.D.   On: 09/29/2021 12:42     Assessment/Plan: MRSA bacteremia.  The source was not clear until 11/19 when the left foot had purulent discharge Then an MRI was done and that showed a 5.6 into 4.2 into 4 cm collection at the site of the first metatarsal head involving the first MTP joint.  He was taken for surgery and underwent left great toe amputation. TEE is negative. Leukocytosis has resolved  vancomycin changed to daptomycin for ease of administration as OP- Will need for 4 weeks.Marland Kitchen     HIV disease followed at [facility].  On abacavir, dolutegravir and Epivir.  CD4 count is more than 300 and undetectable viral load  Hypertension on amlodipine, carvedilol and losartan.  End-stage renal disease on peritoneal dialysis.  Peritoneal fluid no evidence of infection.  Diabetes mellitus on insulin.   Discussed the management with the patient Will  follow him as OP ID will sign off- call if needed

## 2021-09-29 NOTE — TOC Progression Note (Addendum)
Transition of Care [facility]) - Progression Note    Patient Details  Name: Nathan Ayers MRN: [id number] Date of Birth: [birth year]  [facility] CM/SW Contact  Eileen Stanford, LCSW Phone Number: 09/29/2021, 4:10 PM  Clinical Narrative:  Pam with [facility] will be by tomorrow morning to do IV teaching. CSW unable to get pt HH therefore we will do dressing change teaching prior to pt dc.   Pt is getting PD prior to admission .There is a question about adapter. Amanda HD Coordinator is going to look into that.       Expected Discharge Plan and Services                                                 Social Determinants of Health (SDOH) Interventions    Readmission Risk Interventions No flowsheet data found.

## 2021-09-29 NOTE — Procedures (Signed)
Interventional Radiology Procedure Note  Procedure: Tunneled CVC placement  Complications: None  Estimated Blood Loss: < 10 mL  Findings: Left IJ, 5 Fr, SL tunneled Power Line placed, measuring 25 cm. Tip in SVC. OK to use.  Venetia Night. Kathlene Cote, M.D Pager:  [phone]

## 2021-09-29 NOTE — Progress Notes (Signed)
PROGRESS NOTE    Nathan Ayers  MKL:[id number] DOB: 1971-07-23 DOA: 09/22/2021 PCP: Judeth Cornfield, MD   Chief Complaint  Patient presents with   URI    Brief Narrative:   Nathan Ayers is a 50 y.o. male with medical history significant for HIV compliant w/ antiretrovirals, t1dm, ESRD on peritoneal dialysis, OSA not on cpap, and htn, who presents with the above.   Symptoms began about 2 weeks ago. Developed throat pain and went to a local ED/urgent care where was diagnosed with strep throat and given penicillin. Throat pain has resolved, has no pain or difficulty with swallowing. -Patient admitted to the hospital with fever, leukocytosis, initially source was unclear, blood culture growing MRSA, could not identify source, TTE and TEE were negative for endocarditis, patient developed left foot purulent discharge, MRI significant for abscess and osteomyelitismyelitis, status post partial first ray amputation.  Assessment & Plan:   Principal Problem:   Leukocytosis Active Problems:   Malnutrition of moderate degree   Type 1 diabetes (HCC)   Essential hypertension   HIV disease (HCC)   ESRD on dialysis (HCC)   OSA (obstructive sleep apnea)  MRSA Bacteremia secondary to left septic first metatarsal phalangeal joint with abscess/cellulitis/osteomyelitis -Patient with leukocytosis, feeling ill, chills, generalized body ache. -2/3 blood culture growing MRSA -Continue with IV vancomycin -Follow on peritoneal fluid culture and Gram stain, so far no growth -2D echo with no evidence of vegetations, as well TEE has been done by Dr. Rockey Situ  with no evidence of vegetation as well -Follow on repeat blood cultures done 11/18, so far remains negative -Patient remained febrile despite being few days on IV vancomycin. -Leukocytosis trending down -Initially no clear source of infection, but as of 11/19, patient started to drain purulent discharge from his left foot, MRI significant for  foot abscess/infection, podiatry input appreciated, status post left partial first ray amputation and application of antibiotic beads -Antibiotic management per ID.  change from IV vancomycin to t IV daptomycin as an outpatient plan to discharge home with IV antibiotics. -Patient will need central access on discharge, discussed with renal, would rather avoid PICC line, so IR placed tunneled CVC catheter today. Recommendation from ID to continue IV daptomycin until 11/01/2021, and to remove his central line after that.    Left foot infection -Please see above discussion. -S/p first ray amputation and antibiotic beads by Dr. Luana Shu on 11/20 -on as needed oxycodone for pain after surgery.  Hypertension -Overall blood pressure has been on the lower side, patient is on multiple home medications, all has been discontinued including losartan, Norvasc, Coreg and minoxidil. -Pressures improving medication has been added gradually, he is on Norvasc, losartan and Coreg.    Anemia of chronic kidney disease -Continue to monitor CBC closely, Aranesp per renal once appropriate -Hemoglobin was 6.9 yesterday, he did receive 1 unit PRBC.  ESRD - On peritoneal dialysis says is compliant w/ that - nephrology consulted, patient receiving peritoneal dialysis during hospital stay -Upon discharge patient need to follow with his PD clinic to exchange PD adapter from his inpatient to his outpatient, as discussed with renal he will be able to follow with his outpatient clinic where the nurse can get it done (only can be done as an outpatient during hospital stay).  OSA Not on cpap at home   T1DM Says not on insulin since dialysis started - SSI very sensitive    HIV Says compliant w/ meds. Followed by [facility]. Viral load undetectable  in September of this year. Cd4 457 in march of this year - continue ziagen, tivicay, epivir - f/u cd4  Left lower extremity Dopplers with no evidence of DVT     DVT  prophylaxis: Heparin Code Status: Full Family Communication: None at bedside  Disposition:   Status is: inpatient  The patient will require care spanning > 2 midnights and should be moved to inpatient because: Bacteremia and need of IV antibiotics      Consultants:  Renal ID [facility] for TEE. Podiatry  Procedures -TEE 11/17. -Left partial first ray amputation by Dr. Luana Shu 11/20 -CVC tunneled catheter by IR 11/22  Dr Luana Shu 11/20 IR TUNNELED CENTRAL CATHETER   Subjective:  Left pain is controlled, he is awaiting tunneled catheter placement by IR  Objective: Vitals:   09/29/21 1105 09/29/21 1110 09/29/21 1140 09/29/21 1158  BP: (!) 162/74 (!) 166/76 (!) 172/79 (!) 171/77  Pulse: 76 75  74  Resp: 13 15  18   Temp:    97.9 F (36.6 C)  TempSrc:    Oral  SpO2: 100% 100%  100%  Weight:      Height:        Intake/Output Summary (Last 24 hours) at 09/29/2021 1438 Last data filed at 09/29/2021 0103 Gross per 24 hour  Intake 356 ml  Output --  Net 356 ml    Filed Weights   09/24/21 1333 09/28/21 1833 09/29/21 0500  Weight: 104.3 kg 101.3 kg 101 kg    Examination:  Awake Alert, Oriented X 3, No new F.N deficits, Normal affect Symmetrical Chest wall movement, Good air movement bilaterally, CTAB RRR,No Gallops,Rubs or new Murmurs, No Parasternal Heave +ve B.Sounds, Abd Soft, No tenderness, No rebound - guarding or rigidity.   No Cyanosis, Clubbing or edema, left foot bandaged left foot bandaged       Data Reviewed: I have personally reviewed following labs and imaging studies  CBC: Recent Labs  Lab 09/23/21 0643 09/24/21 0552 09/26/21 0455 09/27/21 0459 09/28/21 0411 09/29/21 0815  WBC 25.7* 21.4* 18.1* 13.9* 11.1* 10.2  NEUTROABS 21.7*  --   --   --   --   --   HGB 9.1* 8.5* 7.9* 7.8* 6.9* 7.4*  HCT 25.6* 24.4* 23.7* 22.9* 19.9* 21.7*  MCV 82 82.2 84.3 83.3 84.0 83.5  PLT 330 297 309 340 303 562    Basic Metabolic Panel: Recent Labs  Lab  09/24/21 0552 09/26/21 0455 09/27/21 0459 09/28/21 0411 09/29/21 0815  NA 133* 134* 133* 131* 133*  K 3.4* 3.6 3.6 3.9 4.0  CL 94* 95* 94* 94* 95*  CO2 22 24 23 26 24   GLUCOSE 215* 224* 172* 120* 103*  BUN 97* 89* 88* 75* 68*  CREATININE 20.61* 18.71* 18.37* 17.06* 16.98*  CALCIUM 8.7* 8.7* 8.8* 8.4* 8.5*    GFR: Estimated Creatinine Clearance: 6.9 mL/min (A) (by C-G formula based on SCr of 16.98 mg/dL (H)).  Liver Function Tests: Recent Labs  Lab 09/23/21 0445  AST 14*  ALT 14  ALKPHOS 73  BILITOT 0.7  PROT 7.9  ALBUMIN 2.4*    CBG: Recent Labs  Lab 09/28/21 1143 09/28/21 1639 09/28/21 2221 09/29/21 0810 09/29/21 1309  GLUCAP 120* 130* 120* 105* 108*     Recent Results (from the past 240 hour(s))  Resp Panel by RT-PCR (Flu A&B, Covid) Nasopharyngeal Swab     Status: None   Collection Time: 09/22/21 11:30 AM   Specimen: Nasopharyngeal Swab; Nasopharyngeal(NP) swabs in vial transport medium  Result Value Ref Range Status   SARS Coronavirus 2 by RT PCR NEGATIVE NEGATIVE Final    Comment: (NOTE) SARS-CoV-2 target nucleic acids are NOT DETECTED.  The SARS-CoV-2 RNA is generally detectable in upper respiratory specimens during the acute phase of infection. The lowest concentration of SARS-CoV-2 viral copies this assay can detect is 138 copies/mL. A negative result does not preclude SARS-Cov-2 infection and should not be used as the sole basis for treatment or other patient management decisions. A negative result may occur with  improper specimen collection/handling, submission of specimen other than nasopharyngeal swab, presence of viral mutation(s) within the areas targeted by this assay, and inadequate number of viral copies(<138 copies/mL). A negative result must be combined with clinical observations, patient history, and epidemiological information. The expected result is Negative.  Fact Sheet for Patients:   EntrepreneurPulse.com.au  Fact Sheet for Healthcare Providers:  IncredibleEmployment.be  This test is no t yet approved or cleared by the [place] FDA and  has been authorized for detection and/or diagnosis of SARS-CoV-2 by FDA under an Emergency Use Authorization (EUA). This EUA will remain  in effect (meaning this test can be used) for the duration of the COVID-19 declaration under Section 564(b)(1) of the Act, 21 U.S.C.section 360bbb-3(b)(1), unless the authorization is terminated  or revoked sooner.       Influenza A by PCR NEGATIVE NEGATIVE Final   Influenza B by PCR NEGATIVE NEGATIVE Final    Comment: (NOTE) The Xpert Xpress SARS-CoV-2/FLU/RSV plus assay is intended as an aid in the diagnosis of influenza from Nasopharyngeal swab specimens and should not be used as a sole basis for treatment. Nasal washings and aspirates are unacceptable for Xpert Xpress SARS-CoV-2/FLU/RSV testing.  Fact Sheet for Patients: EntrepreneurPulse.com.au  Fact Sheet for Healthcare Providers: IncredibleEmployment.be  This test is not yet approved or cleared by the [place] FDA and has been authorized for detection and/or diagnosis of SARS-CoV-2 by FDA under an Emergency Use Authorization (EUA). This EUA will remain in effect (meaning this test can be used) for the duration of the COVID-19 declaration under Section 564(b)(1) of the Act, 21 U.S.C. section 360bbb-3(b)(1), unless the authorization is terminated or revoked.  Performed at [facility], [place], [place] [address]   Respiratory (~20 pathogens) panel by PCR     Status: None   Collection Time: 09/22/21 11:30 AM   Specimen: Peritoneal Washings; Respiratory  Result Value Ref Range Status   Adenovirus NOT DETECTED NOT DETECTED Final   Coronavirus 229E NOT DETECTED NOT DETECTED Final    Comment: (NOTE) The Coronavirus on the  Respiratory Panel, DOES NOT test for the novel  Coronavirus (2019 nCoV)    Coronavirus HKU1 NOT DETECTED NOT DETECTED Final   Coronavirus NL63 NOT DETECTED NOT DETECTED Final   Coronavirus OC43 NOT DETECTED NOT DETECTED Final   Metapneumovirus NOT DETECTED NOT DETECTED Final   Rhinovirus / Enterovirus NOT DETECTED NOT DETECTED Final   Influenza A NOT DETECTED NOT DETECTED Final   Influenza B NOT DETECTED NOT DETECTED Final   Parainfluenza Virus 1 NOT DETECTED NOT DETECTED Final   Parainfluenza Virus 2 NOT DETECTED NOT DETECTED Final   Parainfluenza Virus 3 NOT DETECTED NOT DETECTED Final   Parainfluenza Virus 4 NOT DETECTED NOT DETECTED Final   Respiratory Syncytial Virus NOT DETECTED NOT DETECTED Final   Bordetella pertussis NOT DETECTED NOT DETECTED Final   Bordetella Parapertussis NOT DETECTED NOT DETECTED Final   Chlamydophila pneumoniae NOT DETECTED NOT DETECTED  Final   Mycoplasma pneumoniae NOT DETECTED NOT DETECTED Final    Comment: Performed at [facility], [place] [address] [place], [place] [address]  Blood Culture (routine x 2)     Status: Abnormal   Collection Time: 09/22/21  1:32 PM   Specimen: BLOOD  Result Value Ref Range Status   Specimen Description   Final    BLOOD LEFT ANTECUBITAL Performed at [facility], [address] [place], [place] [address]    Special Requests   Final    BOTTLES DRAWN AEROBIC AND ANAEROBIC Blood Culture results may not be optimal due to an excessive volume of blood received in culture bottles Performed at [facility], [address] [place], [place] [address]    Culture  Setup Time   Final    GRAM POSITIVE COCCI ANAEROBIC BOTTLE ONLY CRITICAL VALUE NOTED.  VALUE IS CONSISTENT WITH PREVIOUSLY REPORTED AND CALLED VALUE.    Culture (A)  Final    STAPHYLOCOCCUS AUREUS SUSCEPTIBILITIES PERFORMED ON PREVIOUS CULTURE WITHIN THE LAST 5 DAYS. Performed at [facility], [place] [address], [place], [place]  [address]    Report Status 09/27/2021 FINAL  Final  Blood Culture (routine x 2)     Status: Abnormal   Collection Time: 09/22/21  1:32 PM   Specimen: BLOOD  Result Value Ref Range Status   Specimen Description   Final    BLOOD LEFT ANTECUBITAL Performed at [facility], [address], [place], [place] [address]    Special Requests   Final    BOTTLES DRAWN AEROBIC AND ANAEROBIC Blood Culture adequate volume Performed at [facility], [address] [place], [place] [address]    Culture  Setup Time   Final    GRAM POSITIVE COCCI IN BOTH AEROBIC AND ANAEROBIC BOTTLES Organism ID to follow CRITICAL RESULT CALLED TO, READ BACK BY AND VERIFIED WITH: Ardeen Garland, PHARMD AT 0725 ON 09/23/21 BY GM Performed at [facility], [address] [place], [place] [address]    Culture METHICILLIN RESISTANT STAPHYLOCOCCUS AUREUS (A)  Final   Report Status 09/25/2021 FINAL  Final   Organism ID, Bacteria METHICILLIN RESISTANT STAPHYLOCOCCUS AUREUS  Final      Susceptibility   Methicillin resistant staphylococcus aureus - MIC*    CIPROFLOXACIN 2 INTERMEDIATE Intermediate     ERYTHROMYCIN >=8 RESISTANT Resistant     GENTAMICIN <=0.5 SENSITIVE Sensitive     OXACILLIN >=4 RESISTANT Resistant     TETRACYCLINE <=1 SENSITIVE Sensitive     VANCOMYCIN 1 SENSITIVE Sensitive     TRIMETH/SULFA <=10 SENSITIVE Sensitive     CLINDAMYCIN <=0.25 SENSITIVE Sensitive     RIFAMPIN <=0.5 SENSITIVE Sensitive     Inducible Clindamycin NEGATIVE Sensitive     * METHICILLIN RESISTANT STAPHYLOCOCCUS AUREUS  Blood Culture ID Panel (Reflexed)     Status: Abnormal   Collection Time: 09/22/21  1:32 PM  Result Value Ref Range Status   Enterococcus faecalis NOT DETECTED NOT DETECTED Final   Enterococcus Faecium NOT DETECTED NOT DETECTED Final   Listeria monocytogenes NOT DETECTED NOT DETECTED Final   Staphylococcus species DETECTED (A) NOT DETECTED Final    Comment: CRITICAL RESULT CALLED TO,  READ BACK BY AND VERIFIED WITH: MORGAN HICKS, PHARMD AT 0725 ON 09/23/21 BY GM    Staphylococcus aureus (BCID) DETECTED (A) NOT DETECTED Final    Comment: Methicillin (oxacillin)-resistant Staphylococcus aureus (MRSA). MRSA is predictably resistant to beta-lactam antibiotics (except ceftaroline). Preferred therapy is vancomycin  unless clinically contraindicated. Patient requires contact precautions if  hospitalized. CRITICAL RESULT CALLED TO, READ BACK BY AND VERIFIED WITH: MORGAN HICKS, PHARMD AT 0725 ON 09/23/21 BY GM    Staphylococcus epidermidis NOT DETECTED NOT DETECTED Final   Staphylococcus lugdunensis NOT DETECTED NOT DETECTED Final   Streptococcus species NOT DETECTED NOT DETECTED Final   Streptococcus agalactiae NOT DETECTED NOT DETECTED Final   Streptococcus pneumoniae NOT DETECTED NOT DETECTED Final   Streptococcus pyogenes NOT DETECTED NOT DETECTED Final   A.calcoaceticus-baumannii NOT DETECTED NOT DETECTED Final   Bacteroides fragilis NOT DETECTED NOT DETECTED Final   Enterobacterales NOT DETECTED NOT DETECTED Final   Enterobacter cloacae complex NOT DETECTED NOT DETECTED Final   Escherichia coli NOT DETECTED NOT DETECTED Final   Klebsiella aerogenes NOT DETECTED NOT DETECTED Final   Klebsiella oxytoca NOT DETECTED NOT DETECTED Final   Klebsiella pneumoniae NOT DETECTED NOT DETECTED Final   Proteus species NOT DETECTED NOT DETECTED Final   Salmonella species NOT DETECTED NOT DETECTED Final   Serratia marcescens NOT DETECTED NOT DETECTED Final   Haemophilus influenzae NOT DETECTED NOT DETECTED Final   Neisseria meningitidis NOT DETECTED NOT DETECTED Final   Pseudomonas aeruginosa NOT DETECTED NOT DETECTED Final   Stenotrophomonas maltophilia NOT DETECTED NOT DETECTED Final   Candida albicans NOT DETECTED NOT DETECTED Final   Candida auris NOT DETECTED NOT DETECTED Final   Candida glabrata NOT DETECTED NOT DETECTED Final   Candida krusei NOT DETECTED NOT DETECTED Final    Candida parapsilosis NOT DETECTED NOT DETECTED Final   Candida tropicalis NOT DETECTED NOT DETECTED Final   Cryptococcus neoformans/gattii NOT DETECTED NOT DETECTED Final   Meth resistant mecA/C and MREJ DETECTED (A) NOT DETECTED Final    Comment: CRITICAL RESULT CALLED TO, READ BACK BY AND VERIFIED WITH: Ardeen Garland, PHARMD AT 0725 ON 09/23/21 BY GM Performed at [facility], [address] [place], [place] [address]   Group A Strep by PCR [facility] Only)     Status: None   Collection Time: 09/22/21  1:40 PM   Specimen: Throat; Sterile Swab  Result Value Ref Range Status   Group A Strep by PCR NOT DETECTED NOT DETECTED Final    Comment: Performed at [facility], [address] [place], [place] [address]  Peritoneal fluid culture w Gram Stain     Status: None   Collection Time: 09/22/21  7:29 PM   Specimen: Peritoneal Washings; Peritoneal Fluid  Result Value Ref Range Status   Specimen Description   Final    PERITONEAL Performed at [facility], [address] [place], [place] [address]    Special Requests   Final    NONE Performed at [facility], [address] [place], [place] [address]    Gram Stain   Final    FEW WBC PRESENT, PREDOMINANTLY MONONUCLEAR NO ORGANISMS SEEN    Culture   Final    NO GROWTH 3 DAYS Performed at [facility], [place] [address], [place], [place] [address]    Report Status 09/26/2021 FINAL  Final  Gastrointestinal Panel by PCR , Stool     Status: None   Collection Time: 09/23/21  4:45 AM   Specimen: Stool  Result Value Ref Range Status   Campylobacter species NOT DETECTED NOT DETECTED Final   Plesimonas shigelloides NOT DETECTED NOT DETECTED Final   Salmonella species NOT DETECTED NOT DETECTED Final   Yersinia enterocolitica NOT DETECTED NOT DETECTED Final   Vibrio species NOT  DETECTED NOT DETECTED Final   Vibrio cholerae NOT DETECTED NOT DETECTED Final   Enteroaggregative E coli (EAEC) NOT DETECTED  NOT DETECTED Final   Enteropathogenic E coli (EPEC) NOT DETECTED NOT DETECTED Final   Enterotoxigenic E coli (ETEC) NOT DETECTED NOT DETECTED Final   Shiga like toxin producing E coli (STEC) NOT DETECTED NOT DETECTED Final   Shigella/Enteroinvasive E coli (EIEC) NOT DETECTED NOT DETECTED Final   Cryptosporidium NOT DETECTED NOT DETECTED Final   Cyclospora cayetanensis NOT DETECTED NOT DETECTED Final   Entamoeba histolytica NOT DETECTED NOT DETECTED Final   Giardia lamblia NOT DETECTED NOT DETECTED Final   Adenovirus F40/41 NOT DETECTED NOT DETECTED Final   Astrovirus NOT DETECTED NOT DETECTED Final   Norovirus GI/GII NOT DETECTED NOT DETECTED Final   Rotavirus A NOT DETECTED NOT DETECTED Final   Sapovirus (I, II, IV, and V) NOT DETECTED NOT DETECTED Final    Comment: Performed at [facility], [address] [place], [place] [address]  C Difficile Quick Screen w PCR reflex     Status: None   Collection Time: 09/23/21  4:45 AM   Specimen: Stool  Result Value Ref Range Status   C Diff antigen NEGATIVE NEGATIVE Final   C Diff toxin NEGATIVE NEGATIVE Final   C Diff interpretation No C. difficile detected.  Final    Comment: Performed at [facility], [address] [place], [place] [address]  CULTURE, BLOOD (ROUTINE X 2) w Reflex to ID Panel     Status: None (Preliminary result)   Collection Time: 09/25/21  6:12 AM   Specimen: BLOOD  Result Value Ref Range Status   Specimen Description BLOOD LEFT ARM  Final   Special Requests   Final    BOTTLES DRAWN AEROBIC AND ANAEROBIC Blood Culture adequate volume   Culture   Final    NO GROWTH 4 DAYS Performed at [facility], [address], [place], [place] [address]    Report Status PENDING  Incomplete  CULTURE, BLOOD (ROUTINE X 2) w Reflex to ID Panel     Status: None (Preliminary result)   Collection Time: 09/25/21  6:12 AM   Specimen: BLOOD  Result Value Ref Range Status   Specimen Description BLOOD RIGHT  HAND  Final   Special Requests   Final    BOTTLES DRAWN AEROBIC AND ANAEROBIC Blood Culture adequate volume   Culture   Final    NO GROWTH 4 DAYS Performed at [facility], [address], [place], [place] [address]    Report Status PENDING  Incomplete  Aerobic/Anaerobic Culture w Gram Stain (surgical/deep wound)     Status: None (Preliminary result)   Collection Time: 09/26/21  2:58 PM   Specimen: Foot; Wound  Result Value Ref Range Status   Specimen Description   Final    FOOT LEFT Performed at [facility], [address], [place], [place] [address]    Special Requests   Final    NONE Performed at [facility], [place], [place] [address]    Gram Stain   Final    NO SQUAMOUS EPITHELIAL CELLS SEEN FEW WBC SEEN FEW GRAM POSITIVE COCCI Performed at [facility], [place] [address] [place], [place] [address]    Culture   Final    MODERATE METHICILLIN RESISTANT STAPHYLOCOCCUS AUREUS NO ANAEROBES ISOLATED; CULTURE IN PROGRESS FOR 5 DAYS    Report Status PENDING  Incomplete   Organism ID, Bacteria METHICILLIN  RESISTANT STAPHYLOCOCCUS AUREUS  Final      Susceptibility   Methicillin resistant staphylococcus aureus - MIC*    CIPROFLOXACIN 2 INTERMEDIATE Intermediate     ERYTHROMYCIN >=8 RESISTANT Resistant     GENTAMICIN <=0.5 SENSITIVE Sensitive     OXACILLIN >=4 RESISTANT Resistant     TETRACYCLINE <=1 SENSITIVE Sensitive     VANCOMYCIN 1 SENSITIVE Sensitive     TRIMETH/SULFA <=10 SENSITIVE Sensitive     CLINDAMYCIN <=0.25 SENSITIVE Sensitive     RIFAMPIN <=0.5 SENSITIVE Sensitive     Inducible Clindamycin NEGATIVE Sensitive     * MODERATE METHICILLIN RESISTANT STAPHYLOCOCCUS AUREUS  Surgical PCR screen     Status: Abnormal   Collection Time: 09/27/21  4:43 AM   Specimen: Nasal Mucosa; Nasal Swab  Result Value Ref Range Status   MRSA, PCR POSITIVE (A) NEGATIVE Final    Comment: RESULT CALLED TO, READ BACK BY AND VERIFIED  WITH: CHARLOTTE KYEI AT 1740 ON 09/27/21 [place].    Staphylococcus aureus POSITIVE (A) NEGATIVE Final    Comment: (NOTE) The Xpert SA Assay (FDA approved for NASAL specimens in patients 107 years of age and older), is one component of a comprehensive surveillance program. It is not intended to diagnose infection nor to guide or monitor treatment. Performed at [facility], [address] [place], [place] [address]   Aerobic/Anaerobic Culture w Gram Stain (surgical/deep wound)     Status: None (Preliminary result)   Collection Time: 09/27/21  9:17 AM   Specimen: PATH Digit amputation; Tissue  Result Value Ref Range Status   Specimen Description   Final    WOUND Performed at [facility], [address] [place], [place] [address]    Special Requests   Final    DIGIT Performed at [facility], [place], [place] [address]    Gram Stain   Final    NO SQUAMOUS EPITHELIAL CELLS SEEN FEW WBC SEEN FEW GRAM POSITIVE COCCI Performed at [facility], [place] [address] [place], [place] [address]    Culture   Final    FEW METHICILLIN RESISTANT STAPHYLOCOCCUS AUREUS NO ANAEROBES ISOLATED; CULTURE IN PROGRESS FOR 5 DAYS    Report Status PENDING  Incomplete   Organism ID, Bacteria METHICILLIN RESISTANT STAPHYLOCOCCUS AUREUS  Final      Susceptibility   Methicillin resistant staphylococcus aureus - MIC*    CIPROFLOXACIN 2 INTERMEDIATE Intermediate     ERYTHROMYCIN >=8 RESISTANT Resistant     GENTAMICIN <=0.5 SENSITIVE Sensitive     OXACILLIN >=4 RESISTANT Resistant     TETRACYCLINE <=1 SENSITIVE Sensitive     VANCOMYCIN <=0.5 SENSITIVE Sensitive     TRIMETH/SULFA <=10 SENSITIVE Sensitive     CLINDAMYCIN <=0.25 SENSITIVE Sensitive     RIFAMPIN <=0.5 SENSITIVE Sensitive     Inducible Clindamycin NEGATIVE Sensitive     * FEW METHICILLIN RESISTANT STAPHYLOCOCCUS AUREUS         Radiology Studies: CT ANGIO LOW EXTREM LEFT W &/OR WO  CONTRAST  Result Date: 09/28/2021 CLINICAL DATA:  Reduction defect, lower limb Soft tissue mass, foot, spontaneous hemorrhage EXAM: CT OF THE LOWER LEFT EXTREMITY WITHOUT CONTRAST TECHNIQUE: Multidetector CT imaging of the lower left extremity was performed according to the standard protocol. COMPARISON:  Radiographs same day FINDINGS: Vascular: Left lower extremity Common iliac artery: Patent without significant stenosis or aneurysm. Scattered calcific atherosclerosis without significant stenosis. Internal iliac artery: Patent. External iliac artery: Patent without significant stenosis. Common femoral artery:  Patent without significant stenosis. Profunda femoral artery: Patent without significant stenosis. Superficial femoral artery: Patent without significant stenosis. Scattered calcific atherosclerosis without significant stenosis. Popliteal artery: Patent without significant stenosis or aneurysm. Tibioperoneal trunk: Patent without significant stenosis. Runoff vessels: Patent without significant stenosis. There is a patent three-vessel runoff to the level of the ankle, scattered calcific atherosclerotic disease without significant stenosis or occlusion appreciated. Nonvascular: Status post amputation of the first and second toes with foci of gas and scattered radiodensities, presumably postsurgical in nature. No drainable organized fluid collection. IMPRESSION: 1. The left lower extremity arteries are patent without significant stenosis. There is a three-vessel runoff to the level of the ankle. No findings of active arterial extravasation or pseudoaneurysm associated with the surgical bed. 2. Status post amputation of the first and second toes with surgical defect containing foci of gas and antibiotic beads, presumably postsurgical although underlying infection can not be excluded. No drainable fluid collection. Electronically Signed   By: Albin Felling M.D.   On: 09/28/2021 08:45   IR TUNNELED CENTRAL  VENOUS CATHETER PLACEMENT  Result Date: 09/29/2021 CLINICAL DATA:  Infection of left foot with osteomyelitis of the first toe requiring amputation. The patient requires a tunneled central venous catheter for home IV antibiotics and has a history of end-stage renal disease and previous right arm AV fistula placement. EXAM: TUNNELED CENTRAL VENOUS CATHETER PLACEMENT WITH ULTRASOUND AND FLUOROSCOPIC GUIDANCE ANESTHESIA/SEDATION: Moderate (conscious) sedation was employed during this procedure. A total of Versed 1.0 mg and Fentanyl 50 mcg was administered intravenously by radiology nursing under my supervision. Moderate Sedation Time: 18 minutes. The patient's level of consciousness and vital signs were monitored continuously by radiology nursing throughout the procedure under my direct supervision. MEDICATIONS: None FLUOROSCOPY TIME:  24 seconds.  3.7 mGy. PROCEDURE: The procedure, risks, benefits, and alternatives were explained to the patient. Questions regarding the procedure were encouraged and answered. The patient understands and consents to the procedure. A timeout was performed prior to initiating the procedure. Ultrasound was used to check for patency of the internal jugular veins bilaterally. Ultrasound image recording was performed of the left internal jugular vein. Left neck and chest were prepped with chlorhexidine in a sterile fashion, and a sterile drape was applied covering the operative field. Maximum barrier sterile technique with sterile gowns and gloves were used for the procedure. Local anesthesia was provided with 1% lidocaine. After creating a small venotomy incision, a 21 gauge needle was advanced into the left internal jugular vein under direct, real-time ultrasound guidance with permanent image recording. Ultrasound image documentation was performed. After securing guidewire access, a 6 French peel-away sheath was placed. A wire was kinked to measure appropriate catheter length. A 5 French,  single-lumen tunneled power line was chosen for placement. This was tunneled in a retrograde fashion from the chest wall to the venotomy incision. The catheter was cut to 25 cm. The catheter was then placed through the sheath and the sheath removed. Final catheter positioning was confirmed and documented with a fluoroscopic spot image. The catheter was aspirated, flushed with saline and flushed with heparinized saline. The venotomy incision was closed with subcuticular 4-0 Vicryl. Dermabond was applied to the incision. The catheter exit site was secured with Prolene retention sutures. COMPLICATIONS: None.  No pneumothorax. 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 Patent right external jugular vein is present. The left internal jugular vein is normally patent. After catheter placement, the tip  lies in the SVC. The catheter aspirates normally and is ready for immediate use. IMPRESSION: Placement of tunneled central venous catheter via the left internal jugular vein. The catheter tip lies in the SVC. The catheter is ready for immediate use. 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 Electronically Signed   By: Aletta Edouard M.D.   On: 09/29/2021 12:42        Scheduled Meds:  sodium chloride   Intravenous Once   abacavir  300 mg Oral BID   carvedilol  12.5 mg Oral BID   Chlorhexidine Gluconate Cloth  6 each Topical Q0600   dolutegravir  50 mg Oral QHS   feeding supplement (NEPRO CARB STEADY)  237 mL Oral TID BM   gentamicin cream  1 application Topical Daily   heparin  5,000 Units Subcutaneous Q8H   insulin aspart  0-6 Units Subcutaneous TID WC   lamiVUDine  25 mg Oral QHS   losartan  100 mg Oral QPM   multivitamin  1 tablet Oral Daily   mupirocin ointment  1 application Nasal BID   potassium chloride  20 mEq Oral Daily   sodium chloride flush   3 mL Intravenous Q12H   Continuous Infusions:  sodium chloride     DAPTOmycin (CUBICIN)  IV Stopped (09/28/21 1550)   dialysis solution 1.5% low-MG/low-CA     dialysis solution 2.5% low-MG/low-CA       LOS: 5 days       Phillips Climes, MD [facility]   To contact the attending provider between 7A-7P or the covering provider during after hours 7P-7A, please log into the web site [email or web address] and access using universal [place] password for that web site. If you do not have the password, please call the hospital operator.  09/29/2021, 2:38 PM

## 2021-09-29 NOTE — Progress Notes (Signed)
PT Cancellation Note  Patient Details Name: Nathan Ayers MRN: [id number] DOB: 01-21-1971   Cancelled Treatment:    Reason Eval/Treat Not Completed: Patient at procedure or test/unavailable Patient is off the floor at this time for a procedure. PT will continue with attempts as appropriate.   [name], PT, MPT  [name] 09/29/2021, 10:28 AM

## 2021-09-29 NOTE — Treatment Plan (Addendum)
Diagnosis: MRSA bacteremia and MRSA left foot infection ESRD on PD    Allergies  Allergen Reactions   Lactose Intolerance (Gi)     OPAT Orders Discharge antibiotics: Daptomycin 824m IV every 48 hrs Calendar given  Duration: 6 weeks End Date: 11/01/21   Cental line Care Per Protocol:  Labs weekly  on Monday while on IV antibiotics: _X_ CBC with differential  _X_ CMP _X_ CRP X__ ESR X CPK  _Patient needs an appt with IR to remove central line at the end of treatment  Fax weekly labs to [facility]([phone] Clinic Follow Up Appt with Dr.Makari Sanko :10/20/21 at 10.15 am   Call [phone] any critical value or questions

## 2021-09-29 NOTE — Progress Notes (Signed)
Nutrition Follow-up  DOCUMENTATION CODES:   Not applicable  INTERVENTION:   [employer] [place] Standard 1.0 chocolate- 376m po BID- provides 325kcal and 16g protein per serving   Rena-vit po daily   Liberalize diet- potassium and phosphorus restrictions in [facility]   Pt at high refeed risk; recommend monitor potassium, magnesium and phosphorus labs daily until stable  NUTRITION DIAGNOSIS:   Increased nutrient needs related to chronic illness (ESRD on PD, HIV) as evidenced by increased estimated needs.  GOAL:   Patient will meet greater than or equal to 90% of their needs -progressing   MONITOR:   PO intake, Supplement acceptance, Labs, Weight trends, Skin, I & O's  ASSESSMENT:   50y/o male with h/o anxiety, ESRD on PD, OSA, DM, HIV and HTN who is admitted with sepsis and MRSA bacteremia.  Met with pt in room today. Pt reports poor appetite and oral intake for one week pta. Pt reports that his appetite is improved in hospital. Pt refusing the Nepro supplements as he reports that he is lactose intolerant. Pt reports diarrhea with milk products but reports that he is able to eat yogurt and cheese. Pt also reports drinking Premier Protein at home. Educated pt that Nepro and Ensure supplements are lactose free; premier protein is made from milk. Discussed with pt the importance of adequate nutrition needed to preserve lean muscle and to replace losses from HD. Discussed different supplement options with pt; pt would like to try [employer] this is soy based. RD also recommended a daily renal multivitamin to replace the water soluble vitamins lost during HD. RD will add supplements and vitamins to help pt meet his estimated needs. RD will also liberalize pt's diet and add restrictions in [facility] as pt with increased estimated needs and will have trouble meeting his needs with these restrictions. Per chart, pt appears weight stable pta; pt reports his dry weight is ~101-102lbs.    Medications reviewed and include: heparin, insulin, rena-vit, KCl, daptomycin  Labs reviewed: Na 133(L), K 4.0 wnl, BUN 68(H), creat 16.98(H) Hgb 7.4(L), Hct 21.7(L) Cbgs- 108, 105 x 24 hrs AIC 6.2(H)- 11/15  NUTRITION - FOCUSED PHYSICAL EXAM:  Flowsheet Row Most Recent Value  Orbital Region No depletion  Upper Arm Region Mild depletion  Thoracic and Lumbar Region No depletion  Buccal Region No depletion  Temple Region No depletion  Clavicle Bone Region Mild depletion  Clavicle and Acromion Bone Region Mild depletion  Scapular Bone Region No depletion  Dorsal Hand Mild depletion  Patellar Region Moderate depletion  Anterior Thigh Region Moderate depletion  Posterior Calf Region Moderate depletion  Edema (RD Assessment) Mild  Hair Reviewed  Eyes Reviewed  Mouth Reviewed  Skin Reviewed  Nails Reviewed   Diet Order:   Diet Order             Diet 2 gram sodium Room service appropriate? Yes; Fluid consistency: Thin; Fluid restriction: 1800 mL Fluid  Diet effective now                  EDUCATION NEEDS:   Not appropriate for education at this time  Skin:  Skin Assessment: Reviewed RN Assessment  Last BM:  11/22- type 5  Height:   Ht Readings from Last 1 Encounters:  09/24/21 6' 6"  (1.981 m)    Weight:   Wt Readings from Last 1 Encounters:  09/29/21 101 kg    Ideal Body Weight:  97.2 kg  BMI:  Body mass index  is 25.73 kg/m.  Estimated Nutritional Needs:   Kcal:  2700-3000kcal/day  Protein:  >135g/day  Fluid:  3.0-3.3L/day  Koleen Distance MS, RD, LDN Please refer to [facility] for RD and/or RD on-call/weekend/after hours pager

## 2021-09-29 NOTE — Progress Notes (Signed)
Daily Progress Note   Subjective  - 2 Days Post-Op  Follow-up partial first ray amputation left foot.  Patient resting comfortably.  Objective Vitals:   09/29/21 1110 09/29/21 1140 09/29/21 1158 09/29/21 1516  BP: (!) 166/76 (!) 172/79 (!) 171/77 120/65  Pulse: 75  74 75  Resp: 15  18 19   Temp:   97.9 F (36.6 C) 97.7 F (36.5 C)  TempSrc:   Oral Oral  SpO2: 100%  100% 99%  Weight:      Height:        Physical Exam: Incisions intact.  There is noted drainage to the wound.  Probing of the wound reveals a little bit of purulent drainage to the area today.  Laboratory CBC    Component Value Date/Time   WBC 10.2 09/29/2021 0815   HGB 7.4 (L) 09/29/2021 0815   HGB 9.1 (L) 09/23/2021 0643   HCT 21.7 (L) 09/29/2021 0815   HCT 25.6 (L) 09/23/2021 0643   PLT 280 09/29/2021 0815   PLT 330 09/23/2021 0643    BMET    Component Value Date/Time   NA 133 (L) 09/29/2021 0815   K 4.0 09/29/2021 0815   CL 95 (L) 09/29/2021 0815   CO2 24 09/29/2021 0815   GLUCOSE 103 (H) 09/29/2021 0815   BUN 68 (H) 09/29/2021 0815   CREATININE 16.98 (H) 09/29/2021 0815   CREATININE 1.08 07/19/2013 1631   CALCIUM 8.5 (L) 09/29/2021 0815   GFRNONAA 3 (L) 09/29/2021 0815   GFRNONAA 85 07/19/2013 1631   GFRAA 12 (L) 02/10/2018 1154   GFRAA >89 07/19/2013 1631    Assessment/Planning: Status post left first ray amputation for infection  Unfortunately he still has some infection and purulence from this wound.  He will need a repeat I&D of this area.  We will plan on surgery tomorrow evening.  I discussed this with the patient and his sister today.  I will see him tomorrow evening for surgery.  Orders will be placed   [name]  09/29/2021, 5:55 PM

## 2021-09-29 NOTE — Progress Notes (Signed)
PHARMACY CONSULT NOTE FOR:  OUTPATIENT  PARENTERAL ANTIBIOTIC THERAPY (OPAT)  Indication: MRSA bacteremia and MRSA left foot infection Regimen: daptomycin 800 mg IV every 48 hours End date: 11/01/21  IV antibiotic discharge orders are pended. To discharging provider:  please sign these orders via discharge navigator,  Select New Orders & click on the button choice - Manage This Unsigned Work.     Thank you for allowing pharmacy to be a part of this patient's care.  [name] 09/29/2021, 2:46 PM

## 2021-09-29 NOTE — Progress Notes (Signed)
Physical Therapy Treatment Patient Details Name: Nathan Ayers MRN: [id number] DOB: 05-12-1971 Today's Date: 09/29/2021   History of Present Illness Patient is a 50 year old male with medical history significant for HIV, ESRD on peritoneal dialysis, OSA, HTN who has Bacteremia and sepsis secondary to left septic first metatarsal phalangeal joint with associated abscess, cellulitis, and osteomyelitis. s/p left partial first ray amputation and application of antibiotic beeds.    PT Comments    Patient was agreeable to PT. He reports he wants to discharge to his parent's home in [place] if possible. He is making progress with functional independence and has increased gait distance this session. Patient was fatigued with activity. Verbal cues for technique and importance to maintain NWB of LLE with functional activity and patient able to demonstrate during mobility. Recommend to continue PT  to maximize independence and facilitate return to prior level of function.    Recommendations for follow up therapy are one component of a multi-disciplinary discharge planning process, led by the attending physician.  Recommendations may be updated based on patient status, additional functional criteria and insurance authorization.  Follow Up Recommendations  Home health PT     Assistance Recommended at Discharge Intermittent Supervision/Assistance  Equipment Recommendations  Rolling walker (2 wheels)    Recommendations for Other Services       Precautions / Restrictions Precautions Precautions: Fall Restrictions Weight Bearing Restrictions: Yes LLE Weight Bearing: Non weight bearing Other Position/Activity Restrictions: L foot NWB. Heel contact in surgical shoe for transfers only     Mobility  Bed Mobility Overal bed mobility: Modified Independent                  Transfers Overall transfer level: Needs assistance Equipment used: Rolling walker (2 wheels) Transfers: Sit  to/from Stand Sit to Stand: Supervision;Min assist           General transfer comment: verbal cues for technique to stand. patient had a loss of balance with standing that requried Min A  to midline. no dizziness reported with upright activity today    Ambulation/Gait Ambulation/Gait assistance: Min guard Gait Distance (Feet): 40 Feet Assistive device: Rolling walker (2 wheels)   Gait velocity: decreased     General Gait Details: surgical shoe donned prior to standing up/walking. patient able to walk to door and back to bed with no loss of balance while maintaining NWB of LLE. although patient has increased activity tolerance and gait distance this session, further activity limited by fatigue. encouraged patient to maintain NWB of LLE with functional activity.   Stairs             Wheelchair Mobility    Modified Rankin (Stroke Patients Only)       Balance Overall balance assessment: Needs assistance Sitting-balance support: Feet supported Sitting balance-Leahy Scale: Normal     Standing balance support: Bilateral upper extremity supported;During functional activity;Reliant on assistive device for balance Standing balance-Leahy Scale: Fair Standing balance comment: patient needs RW for UE support in standing                            Cognition Arousal/Alertness: Awake/alert Behavior During Therapy: WFL for tasks assessed/performed Overall Cognitive Status: Within Functional Limits for tasks assessed  Exercises      General Comments        Pertinent Vitals/Pain Pain Assessment: No/denies pain    Home Living                          Prior Function            PT Goals (current goals can now be found in the care plan section) Acute Rehab PT Goals Patient Stated Goal: to go home and have home health PT Goal Formulation: With patient Time For Goal Achievement:  10/12/21 Potential to Achieve Goals: Good Progress towards PT goals: Progressing toward goals    Frequency    Min 2X/week      PT Plan Current plan remains appropriate    Co-evaluation              AM-PAC PT "6 Clicks" Mobility   Outcome Measure  Help needed turning from your back to your side while in a flat bed without using bedrails?: None Help needed moving from lying on your back to sitting on the side of a flat bed without using bedrails?: A Little Help needed moving to and from a bed to a chair (including a wheelchair)?: A Little Help needed standing up from a chair using your arms (e.g., wheelchair or bedside chair)?: A Little Help needed to walk in hospital room?: A Little Help needed climbing 3-5 steps with a railing? : A Little 6 Click Score: 19    End of Session Equipment Utilized During Treatment:  (surgical shoe) Activity Tolerance: Patient tolerated treatment well Patient left: in bed;with call bell/phone within reach;with family/visitor present   PT Visit Diagnosis: Unsteadiness on feet (R26.81);Muscle weakness (generalized) (M62.81)     Time: 1610-9604 PT Time Calculation (min) (ACUTE ONLY): 18 min  Charges:  $Therapeutic Activity: 8-22 mins                    Nathan Ayers, PT, MPT   Nathan Ayers 09/29/2021, 4:06 PM

## 2021-09-30 ENCOUNTER — Inpatient Hospital Stay: Payer: Medicare Other | Admitting: Anesthesiology

## 2021-09-30 ENCOUNTER — Encounter
Admission: EM | Disposition: A | Discharge status: Home / Self Care | Payer: Self-pay | Source: Home / Self Care | Attending: Internal Medicine

## 2021-09-30 DIAGNOSIS — D631 Anemia in chronic kidney disease: Secondary | ICD-10-CM | POA: Diagnosis not present

## 2021-09-30 DIAGNOSIS — Z992 Dependence on renal dialysis: Secondary | ICD-10-CM | POA: Diagnosis not present

## 2021-09-30 DIAGNOSIS — D509 Iron deficiency anemia, unspecified: Secondary | ICD-10-CM | POA: Diagnosis not present

## 2021-09-30 DIAGNOSIS — R17 Unspecified jaundice: Secondary | ICD-10-CM | POA: Diagnosis not present

## 2021-09-30 DIAGNOSIS — N186 End stage renal disease: Secondary | ICD-10-CM | POA: Diagnosis not present

## 2021-09-30 DIAGNOSIS — N2589 Other disorders resulting from impaired renal tubular function: Secondary | ICD-10-CM | POA: Diagnosis not present

## 2021-09-30 DIAGNOSIS — M869 Osteomyelitis, unspecified: Secondary | ICD-10-CM

## 2021-09-30 DIAGNOSIS — Z79899 Other long term (current) drug therapy: Secondary | ICD-10-CM | POA: Diagnosis not present

## 2021-09-30 DIAGNOSIS — N2581 Secondary hyperparathyroidism of renal origin: Secondary | ICD-10-CM | POA: Diagnosis not present

## 2021-09-30 DIAGNOSIS — E440 Moderate protein-calorie malnutrition: Secondary | ICD-10-CM | POA: Diagnosis not present

## 2021-09-30 DIAGNOSIS — B9561 Methicillin susceptible Staphylococcus aureus infection as the cause of diseases classified elsewhere: Secondary | ICD-10-CM

## 2021-09-30 HISTORY — PX: INCISION AND DRAINAGE: SHX5863

## 2021-09-30 LAB — CULTURE, BLOOD (ROUTINE X 2)
Culture: NO GROWTH
Culture: NO GROWTH
Special Requests: ADEQUATE
Special Requests: ADEQUATE

## 2021-09-30 LAB — GLUCOSE, CAPILLARY
Glucose-Capillary: 164 mg/dL — ABNORMAL HIGH (ref 70–99)
Glucose-Capillary: 118 mg/dL — ABNORMAL HIGH (ref 70–99)
Glucose-Capillary: 87 mg/dL (ref 70–99)
Glucose-Capillary: 90 mg/dL (ref 70–99)
Glucose-Capillary: 123 mg/dL — ABNORMAL HIGH (ref 70–99)

## 2021-09-30 LAB — CBC
HCT: 20.8 % — ABNORMAL LOW (ref 39.0–52.0)
Hemoglobin: 7 g/dL — ABNORMAL LOW (ref 13.0–17.0)
MCH: 28.2 pg (ref 26.0–34.0)
MCHC: 33.7 g/dL (ref 30.0–36.0)
MCV: 83.9 fL (ref 80.0–100.0)
Platelets: 259 10*3/uL (ref 150–400)
RBC: 2.48 MIL/uL — ABNORMAL LOW (ref 4.22–5.81)
RDW: 14.5 % (ref 11.5–15.5)
WBC: 9 10*3/uL (ref 4.0–10.5)
nRBC: 0 % (ref 0.0–0.2)

## 2021-09-30 LAB — SURGICAL PATHOLOGY

## 2021-09-30 LAB — PREPARE RBC (CROSSMATCH)

## 2021-09-30 SURGERY — INCISION AND DRAINAGE
Anesthesia: General | Site: Foot | Laterality: Left

## 2021-09-30 MED ORDER — ACETAMINOPHEN 10 MG/ML IV SOLN: 1000.0000 mg | Freq: Once | INTRAVENOUS | Status: DC | PRN

## 2021-09-30 MED ORDER — OXYCODONE HCL 5 MG/5ML PO SOLN: 5.0000 mg | Freq: Once | ORAL | Status: DC | PRN

## 2021-09-30 MED ORDER — PROPOFOL 500 MG/50ML IV EMUL
INTRAVENOUS | Status: DC | PRN
  Administered 2021-09-30: 100 ug/kg/min via INTRAVENOUS

## 2021-09-30 MED ORDER — OXYCODONE HCL 5 MG PO TABS: 5.0000 mg | ORAL_TABLET | Freq: Once | ORAL | Status: DC | PRN

## 2021-09-30 MED ORDER — POVIDONE-IODINE 10 % EX SWAB
2.0000 "application " | Freq: Once | CUTANEOUS | Status: AC
  Administered 2021-09-30: 2 via TOPICAL

## 2021-09-30 MED ORDER — LIDOCAINE-EPINEPHRINE 1 %-1:100000 IJ SOLN
INTRAMUSCULAR | Status: DC | PRN
  Administered 2021-09-30: 10 mL

## 2021-09-30 MED ORDER — BUPIVACAINE HCL (PF) 0.5 % IJ SOLN
INTRAMUSCULAR | Status: AC
  Filled 2021-09-30: qty 30

## 2021-09-30 MED ORDER — LIDOCAINE HCL 1 % IJ SOLN: INTRAMUSCULAR | Status: DC | PRN

## 2021-09-30 MED ORDER — MIDAZOLAM HCL 2 MG/2ML IJ SOLN
INTRAMUSCULAR | Status: AC
  Filled 2021-09-30: qty 2

## 2021-09-30 MED ORDER — SODIUM CHLORIDE 0.9% IV SOLUTION: Freq: Once | INTRAVENOUS | Status: AC

## 2021-09-30 MED ORDER — PROPOFOL 10 MG/ML IV BOLUS
INTRAVENOUS | Status: AC
  Filled 2021-09-30: qty 20

## 2021-09-30 MED ORDER — LIDOCAINE HCL (CARDIAC) PF 100 MG/5ML IV SOSY
PREFILLED_SYRINGE | INTRAVENOUS | Status: DC | PRN
  Administered 2021-09-30: 50 mg via INTRAVENOUS

## 2021-09-30 MED ORDER — 0.9 % SODIUM CHLORIDE (POUR BTL) OPTIME
TOPICAL | Status: DC | PRN
  Administered 2021-09-30: 1500 mL

## 2021-09-30 MED ORDER — CHLORHEXIDINE GLUCONATE 4 % EX LIQD: 60.0000 mL | Freq: Once | CUTANEOUS | Status: DC

## 2021-09-30 MED ORDER — CHLORHEXIDINE GLUCONATE 0.12 % MT SOLN
OROMUCOSAL | Status: AC
  Filled 2021-09-30: qty 15

## 2021-09-30 MED ORDER — MIDAZOLAM HCL 2 MG/2ML IJ SOLN
INTRAMUSCULAR | Status: DC | PRN
  Administered 2021-09-30: 2 mg via INTRAVENOUS

## 2021-09-30 MED ORDER — FENTANYL CITRATE (PF) 100 MCG/2ML IJ SOLN
INTRAMUSCULAR | Status: AC
  Filled 2021-09-30: qty 2

## 2021-09-30 MED ORDER — FENTANYL CITRATE (PF) 100 MCG/2ML IJ SOLN: 25.0000 ug | INTRAMUSCULAR | Status: DC | PRN

## 2021-09-30 MED ORDER — CHLORHEXIDINE GLUCONATE 0.12 % MT SOLN
15.0000 mL | Freq: Once | OROMUCOSAL | Status: AC
  Administered 2021-09-30: 15 mL via OROMUCOSAL

## 2021-09-30 MED ORDER — LIDOCAINE HCL (PF) 1 % IJ SOLN
INTRAMUSCULAR | Status: AC
  Filled 2021-09-30: qty 30

## 2021-09-30 MED ORDER — ONDANSETRON HCL 4 MG/2ML IJ SOLN: 4.0000 mg | Freq: Once | INTRAMUSCULAR | Status: DC | PRN

## 2021-09-30 MED ORDER — FENTANYL CITRATE (PF) 100 MCG/2ML IJ SOLN
INTRAMUSCULAR | Status: DC | PRN
  Administered 2021-09-30: 25 ug via INTRAVENOUS

## 2021-09-30 MED ORDER — LIDOCAINE-EPINEPHRINE 1 %-1:100000 IJ SOLN
INTRAMUSCULAR | Status: AC
  Filled 2021-09-30: qty 1

## 2021-09-30 SURGICAL SUPPLY — 62 items
BLADE OSC/SAGITTAL MD 5.5X18 (BLADE) IMPLANT
BLADE OSCILLATING/SAGITTAL (BLADE)
BLADE SURG 15 STRL LF DISP TIS (BLADE) ×1 IMPLANT
BLADE SURG 15 STRL SS (BLADE) ×2
BLADE SW THK.38XMED LNG THN (BLADE) IMPLANT
BNDG COHESIVE 4X5 TAN ST LF (GAUZE/BANDAGES/DRESSINGS) IMPLANT
BNDG COHESIVE 6X5 TAN ST LF (GAUZE/BANDAGES/DRESSINGS) IMPLANT
BNDG CONFORM 2 STRL LF (GAUZE/BANDAGES/DRESSINGS) IMPLANT
BNDG CONFORM 3 STRL LF (GAUZE/BANDAGES/DRESSINGS) ×3 IMPLANT
BNDG ELASTIC 4X5.8 VLCR STR LF (GAUZE/BANDAGES/DRESSINGS) ×3 IMPLANT
BNDG ESMARK 4X12 TAN STRL LF (GAUZE/BANDAGES/DRESSINGS) IMPLANT
BNDG GAUZE ELAST 4 BULKY (GAUZE/BANDAGES/DRESSINGS) ×3 IMPLANT
CANISTER WOUND CARE 500ML ATS (WOUND CARE) IMPLANT
CUFF TOURN SGL QUICK 12 (TOURNIQUET CUFF) IMPLANT
CUFF TOURN SGL QUICK 18X4 (TOURNIQUET CUFF) IMPLANT
DRAPE FLUOR MINI C-ARM 54X84 (DRAPES) IMPLANT
DRAPE XRAY CASSETTE 23X24 (DRAPES) IMPLANT
DRSG MEPILEX FLEX 3X3 (GAUZE/BANDAGES/DRESSINGS) IMPLANT
DURAPREP 26ML APPLICATOR (WOUND CARE) ×3 IMPLANT
ELECT REM PT RETURN 9FT ADLT (ELECTROSURGICAL) ×3
ELECTRODE REM PT RTRN 9FT ADLT (ELECTROSURGICAL) ×1 IMPLANT
GAUZE 4X4 16PLY ~~LOC~~+RFID DBL (SPONGE) ×3 IMPLANT
GAUZE PACKING 1/4 X5 YD (GAUZE/BANDAGES/DRESSINGS) IMPLANT
GAUZE PACKING IODOFORM 1X5 (PACKING) IMPLANT
GAUZE SPONGE 4X4 12PLY STRL (GAUZE/BANDAGES/DRESSINGS) ×3 IMPLANT
GAUZE XEROFORM 1X8 LF (GAUZE/BANDAGES/DRESSINGS) ×3 IMPLANT
GLOVE SURG ENC MOIS LTX SZ7.5 (GLOVE) ×3 IMPLANT
GLOVE SURG UNDER LTX SZ8 (GLOVE) ×3 IMPLANT
GOWN STRL REUS W/ TWL XL LVL3 (GOWN DISPOSABLE) ×2 IMPLANT
GOWN STRL REUS W/TWL MED LVL3 (GOWN DISPOSABLE) ×6 IMPLANT
GOWN STRL REUS W/TWL XL LVL3 (GOWN DISPOSABLE) ×4
HANDPIECE VERSAJET DEBRIDEMENT (MISCELLANEOUS) IMPLANT
IV NS 1000ML (IV SOLUTION) ×2
IV NS 1000ML BAXH (IV SOLUTION) ×1 IMPLANT
IV NS IRRIG 3000ML ARTHROMATIC (IV SOLUTION) IMPLANT
KIT DRSG VAC SLVR GRANUFM (MISCELLANEOUS) IMPLANT
KIT TURNOVER KIT A (KITS) ×3 IMPLANT
LABEL OR SOLS (LABEL) ×3 IMPLANT
MANIFOLD NEPTUNE II (INSTRUMENTS) ×3 IMPLANT
NEEDLE FILTER BLUNT 18X 1/2SAF (NEEDLE) ×2
NEEDLE FILTER BLUNT 18X1 1/2 (NEEDLE) ×1 IMPLANT
NEEDLE HYPO 25X1 1.5 SAFETY (NEEDLE) ×3 IMPLANT
NS IRRIG 500ML POUR BTL (IV SOLUTION) ×3 IMPLANT
PACK EXTREMITY ARMC (MISCELLANEOUS) ×3 IMPLANT
PAD ABD DERMACEA PRESS 5X9 (GAUZE/BANDAGES/DRESSINGS) ×6 IMPLANT
PENCIL ELECTRO HAND CTR (MISCELLANEOUS) ×3 IMPLANT
PULSAVAC PLUS IRRIG FAN TIP (DISPOSABLE)
RASP SM TEAR CROSS CUT (RASP) IMPLANT
SHIELD FULL FACE ANTIFOG 7M (MISCELLANEOUS) ×6 IMPLANT
STOCKINETTE IMPERVIOUS 9X36 MD (GAUZE/BANDAGES/DRESSINGS) ×3 IMPLANT
SUT ETHILON 2 0 FS 18 (SUTURE) ×6 IMPLANT
SUT ETHILON 4-0 (SUTURE) ×2
SUT ETHILON 4-0 FS2 18XMFL BLK (SUTURE) ×1
SUT VIC AB 3-0 SH 27 (SUTURE) ×2
SUT VIC AB 3-0 SH 27X BRD (SUTURE) ×1 IMPLANT
SUT VIC AB 4-0 FS2 27 (SUTURE) ×3 IMPLANT
SUTURE ETHLN 4-0 FS2 18XMF BLK (SUTURE) ×1 IMPLANT
SWAB CULTURE AMIES ANAERIB BLU (MISCELLANEOUS) IMPLANT
SYR 10ML LL (SYRINGE) ×3 IMPLANT
SYR 3ML LL SCALE MARK (SYRINGE) ×3 IMPLANT
TIP FAN IRRIG PULSAVAC PLUS (DISPOSABLE) IMPLANT
WATER STERILE IRR 500ML POUR (IV SOLUTION) IMPLANT

## 2021-09-30 NOTE — Hospital Course (Addendum)
Nathan Ayers is a 50  yo male with PMH ESRD on PD, HIV, DM1, OSA, HTN who presented with generalized malaise.  He was admitted for further work-up regarding possible sources of infection. Ultimately he developed purulent drainage from his left foot and further work-up of this showed acute osteomyelitis with underlying first MTP joint effusion concerning for septic arthritis as well as an underlying soft tissue abscess. He was evaluated by podiatry and underwent left partial first ray amputation on 09/27/2021. Ongoing work-up also revealed MRSA bacteremia during hospitalization.  He underwent evaluation by ID and also underwent endocarditis work-up with TEE.  He was recommended to continue on antibiotic coverage for 4 weeks.  He was treated with vancomycin initially which was transitioned to daptomycin for ease of administration prior to discharge. Due to ongoing purulent drainage from his wound, he required repeat I&D with podiatry on 09/30/2021. See below for further A&P.

## 2021-09-30 NOTE — Progress Notes (Signed)
Agree with Randa Ngo, RN assessment.  No significant changes noted.  Will report off to oncoming nurse.  No acute distress noted.

## 2021-09-30 NOTE — Assessment & Plan Note (Addendum)
-   11/15 blood culture positive for MRSA; repeat blood culture from 09/25/21 is negative - followed by ID - s/p TTE on 11/15 and TEE on 11/17; no signs of endocarditis or veg - s/p vanc, now on daptomycin for 4 week course; underwent tunneled left IJ placement with IR on 11/22 for outpatient abx -Outpatient follow-up with ID at discharge.  Daptomycin arranged through home infusions

## 2021-09-30 NOTE — Assessment & Plan Note (Signed)
-   not on CPAP at home

## 2021-09-30 NOTE — Progress Notes (Signed)
[facility]  ROUNDING NOTE   Subjective:   Patient sitting up in bed Awaiting breakfast but will be NPO afterwards for I&D  No complains of pain   Objective:  Vital signs in last 24 hours:  Temp:  [97.7 F (36.5 C)-98.7 F (37.1 C)] 98.7 F (37.1 C) (11/23 1140) Pulse Rate:  [72-79] 76 (11/23 1140) Resp:  [18-19] 18 (11/23 1140) BP: (120-188)/(65-83) 163/73 (11/23 1140) SpO2:  [97 %-100 %] 99 % (11/23 0745)  Weight change:  Filed Weights   09/24/21 1333 09/28/21 1833 09/29/21 0500  Weight: 104.3 kg 101.3 kg 101 kg    Intake/Output: No intake/output data recorded.   Intake/Output this shift:  No intake/output data recorded.  Physical Exam: General: NAD, sitting up in bed  Head: Normocephalic, atraumatic. Moist oral mucosal membranes  Lungs:  Clear bilaterally, normal effort  Heart: Regular rate and rhythm  Abdomen:  Soft, nontender, PD catheter  Extremities: No peripheral edema. Right foot dressings clean, dry and intact  Neurologic: Nonfocal, moving all four extremities  Skin: No lesions  Access: PD catheter, right radiocephalic AVF    Basic Metabolic Panel: Recent Labs  Lab 09/24/21 0552 09/26/21 0455 09/27/21 0459 09/28/21 0411 09/29/21 0815  NA 133* 134* 133* 131* 133*  K 3.4* 3.6 3.6 3.9 4.0  CL 94* 95* 94* 94* 95*  CO2 22 24 23 26 24   GLUCOSE 215* 224* 172* 120* 103*  BUN 97* 89* 88* 75* 68*  CREATININE 20.61* 18.71* 18.37* 17.06* 16.98*  CALCIUM 8.7* 8.7* 8.8* 8.4* 8.5*     Liver Function Tests: No results for input(s): AST, ALT, ALKPHOS, BILITOT, PROT, ALBUMIN in the last 168 hours.  No results for input(s): LIPASE, AMYLASE in the last 168 hours. No results for input(s): AMMONIA in the last 168 hours.  CBC: Recent Labs  Lab 09/26/21 0455 09/27/21 0459 09/28/21 0411 09/29/21 0815 09/30/21 0520  WBC 18.1* 13.9* 11.1* 10.2 9.0  HGB 7.9* 7.8* 6.9* 7.4* 7.0*  HCT 23.7* 22.9* 19.9* 21.7* 20.8*  MCV 84.3 83.3 84.0 83.5  83.9  PLT 309 340 303 280 259     Cardiac Enzymes: Recent Labs  Lab 09/28/21 0411  CKTOTAL 70     BNP: Invalid input(s): POCBNP  CBG: Recent Labs  Lab 09/29/21 1309 09/29/21 1622 09/29/21 2154 09/30/21 0742 09/30/21 1126  GLUCAP 108* 143* 141* 164* 118*     Microbiology: Results for orders placed or performed during the hospital encounter of 09/22/21  Resp Panel by RT-PCR (Flu A&B, Covid) Nasopharyngeal Swab     Status: None   Collection Time: 09/22/21 11:30 AM   Specimen: Nasopharyngeal Swab; Nasopharyngeal(NP) swabs in vial transport medium  Result Value Ref Range Status   SARS Coronavirus 2 by RT PCR NEGATIVE NEGATIVE Final    Comment: (NOTE) SARS-CoV-2 target nucleic acids are NOT DETECTED.  The SARS-CoV-2 RNA is generally detectable in upper respiratory specimens during the acute phase of infection. The lowest concentration of SARS-CoV-2 viral copies this assay can detect is 138 copies/mL. A negative result does not preclude SARS-Cov-2 infection and should not be used as the sole basis for treatment or other patient management decisions. A negative result may occur with  improper specimen collection/handling, submission of specimen other than nasopharyngeal swab, presence of viral mutation(s) within the areas targeted by this assay, and inadequate number of viral copies(<138 copies/mL). A negative result must be combined with clinical observations, patient history, and epidemiological information. The expected result is Negative.  Fact Sheet  for Patients:  EntrepreneurPulse.com.au  Fact Sheet for Healthcare Providers:  IncredibleEmployment.be  This test is no t yet approved or cleared by the [place] FDA and  has been authorized for detection and/or diagnosis of SARS-CoV-2 by FDA under an Emergency Use Authorization (EUA). This EUA will remain  in effect (meaning this test can be used) for the duration of  the COVID-19 declaration under Section 564(b)(1) of the Act, 21 U.S.C.section 360bbb-3(b)(1), unless the authorization is terminated  or revoked sooner.       Influenza A by PCR NEGATIVE NEGATIVE Final   Influenza B by PCR NEGATIVE NEGATIVE Final    Comment: (NOTE) The Xpert Xpress SARS-CoV-2/FLU/RSV plus assay is intended as an aid in the diagnosis of influenza from Nasopharyngeal swab specimens and should not be used as a sole basis for treatment. Nasal washings and aspirates are unacceptable for Xpert Xpress SARS-CoV-2/FLU/RSV testing.  Fact Sheet for Patients: EntrepreneurPulse.com.au  Fact Sheet for Healthcare Providers: IncredibleEmployment.be  This test is not yet approved or cleared by the [place] FDA and has been authorized for detection and/or diagnosis of SARS-CoV-2 by FDA under an Emergency Use Authorization (EUA). This EUA will remain in effect (meaning this test can be used) for the duration of the COVID-19 declaration under Section 564(b)(1) of the Act, 21 U.S.C. section 360bbb-3(b)(1), unless the authorization is terminated or revoked.  Performed at [facility], [place], [place] [address]   Respiratory (~20 pathogens) panel by PCR     Status: None   Collection Time: 09/22/21 11:30 AM   Specimen: Peritoneal Washings; Respiratory  Result Value Ref Range Status   Adenovirus NOT DETECTED NOT DETECTED Final   Coronavirus 229E NOT DETECTED NOT DETECTED Final    Comment: (NOTE) The Coronavirus on the Respiratory Panel, DOES NOT test for the novel  Coronavirus (2019 nCoV)    Coronavirus HKU1 NOT DETECTED NOT DETECTED Final   Coronavirus NL63 NOT DETECTED NOT DETECTED Final   Coronavirus OC43 NOT DETECTED NOT DETECTED Final   Metapneumovirus NOT DETECTED NOT DETECTED Final   Rhinovirus / Enterovirus NOT DETECTED NOT DETECTED Final   Influenza A NOT DETECTED NOT DETECTED Final   Influenza B NOT  DETECTED NOT DETECTED Final   Parainfluenza Virus 1 NOT DETECTED NOT DETECTED Final   Parainfluenza Virus 2 NOT DETECTED NOT DETECTED Final   Parainfluenza Virus 3 NOT DETECTED NOT DETECTED Final   Parainfluenza Virus 4 NOT DETECTED NOT DETECTED Final   Respiratory Syncytial Virus NOT DETECTED NOT DETECTED Final   Bordetella pertussis NOT DETECTED NOT DETECTED Final   Bordetella Parapertussis NOT DETECTED NOT DETECTED Final   Chlamydophila pneumoniae NOT DETECTED NOT DETECTED Final   Mycoplasma pneumoniae NOT DETECTED NOT DETECTED Final    Comment: Performed at [facility], [place]. [address] [place], [place] [address]  Blood Culture (routine x 2)     Status: Abnormal   Collection Time: 09/22/21  1:32 PM   Specimen: BLOOD  Result Value Ref Range Status   Specimen Description   Final    BLOOD LEFT ANTECUBITAL Performed at [facility], [address] [place], [place] [address]    Special Requests   Final    BOTTLES DRAWN AEROBIC AND ANAEROBIC Blood Culture results may not be optimal due to an excessive volume of blood received in culture bottles Performed at [facility], [address] [place], [place] [address]    Culture  Setup Time   Final  GRAM POSITIVE COCCI ANAEROBIC BOTTLE ONLY CRITICAL VALUE NOTED.  VALUE IS CONSISTENT WITH PREVIOUSLY REPORTED AND CALLED VALUE.    Culture (A)  Final    STAPHYLOCOCCUS AUREUS SUSCEPTIBILITIES PERFORMED ON PREVIOUS CULTURE WITHIN THE LAST 5 DAYS. Performed at [facility], [place] [address] [place], [place] [address]    Report Status 09/27/2021 FINAL  Final  Blood Culture (routine x 2)     Status: Abnormal   Collection Time: 09/22/21  1:32 PM   Specimen: BLOOD  Result Value Ref Range Status   Specimen Description   Final    BLOOD LEFT ANTECUBITAL Performed at [facility], [address] [place], [place] [address]    Special Requests   Final    BOTTLES DRAWN AEROBIC AND ANAEROBIC Blood Culture  adequate volume Performed at [facility], [address] [place], [place] [address]    Culture  Setup Time   Final    GRAM POSITIVE COCCI IN BOTH AEROBIC AND ANAEROBIC BOTTLES Organism ID to follow CRITICAL RESULT CALLED TO, READ BACK BY AND VERIFIED WITH: Ardeen Garland, PHARMD AT 0725 ON 09/23/21 BY GM Performed at [facility], [address] [place], [place] [address]    Culture METHICILLIN RESISTANT STAPHYLOCOCCUS AUREUS (A)  Final   Report Status 09/25/2021 FINAL  Final   Organism ID, Bacteria METHICILLIN RESISTANT STAPHYLOCOCCUS AUREUS  Final      Susceptibility   Methicillin resistant staphylococcus aureus - MIC*    CIPROFLOXACIN 2 INTERMEDIATE Intermediate     ERYTHROMYCIN >=8 RESISTANT Resistant     GENTAMICIN <=0.5 SENSITIVE Sensitive     OXACILLIN >=4 RESISTANT Resistant     TETRACYCLINE <=1 SENSITIVE Sensitive     VANCOMYCIN 1 SENSITIVE Sensitive     TRIMETH/SULFA <=10 SENSITIVE Sensitive     CLINDAMYCIN <=0.25 SENSITIVE Sensitive     RIFAMPIN <=0.5 SENSITIVE Sensitive     Inducible Clindamycin NEGATIVE Sensitive     * METHICILLIN RESISTANT STAPHYLOCOCCUS AUREUS  Blood Culture ID Panel (Reflexed)     Status: Abnormal   Collection Time: 09/22/21  1:32 PM  Result Value Ref Range Status   Enterococcus faecalis NOT DETECTED NOT DETECTED Final   Enterococcus Faecium NOT DETECTED NOT DETECTED Final   Listeria monocytogenes NOT DETECTED NOT DETECTED Final   Staphylococcus species DETECTED (A) NOT DETECTED Final    Comment: CRITICAL RESULT CALLED TO, READ BACK BY AND VERIFIED WITH: MORGAN HICKS, PHARMD AT 0725 ON 09/23/21 BY GM    Staphylococcus aureus (BCID) DETECTED (A) NOT DETECTED Final    Comment: Methicillin (oxacillin)-resistant Staphylococcus aureus (MRSA). MRSA is predictably resistant to beta-lactam antibiotics (except ceftaroline). Preferred therapy is vancomycin unless clinically contraindicated. Patient requires contact precautions if   hospitalized. CRITICAL RESULT CALLED TO, READ BACK BY AND VERIFIED WITH: MORGAN HICKS, PHARMD AT 0725 ON 09/23/21 BY GM    Staphylococcus epidermidis NOT DETECTED NOT DETECTED Final   Staphylococcus lugdunensis NOT DETECTED NOT DETECTED Final   Streptococcus species NOT DETECTED NOT DETECTED Final   Streptococcus agalactiae NOT DETECTED NOT DETECTED Final   Streptococcus pneumoniae NOT DETECTED NOT DETECTED Final   Streptococcus pyogenes NOT DETECTED NOT DETECTED Final   A.calcoaceticus-baumannii NOT DETECTED NOT DETECTED Final   Bacteroides fragilis NOT DETECTED NOT DETECTED Final   Enterobacterales NOT DETECTED NOT DETECTED Final   Enterobacter cloacae complex NOT DETECTED NOT DETECTED Final   Escherichia coli NOT DETECTED NOT DETECTED Final   Klebsiella aerogenes NOT DETECTED NOT DETECTED Final   Klebsiella oxytoca  NOT DETECTED NOT DETECTED Final   Klebsiella pneumoniae NOT DETECTED NOT DETECTED Final   Proteus species NOT DETECTED NOT DETECTED Final   Salmonella species NOT DETECTED NOT DETECTED Final   Serratia marcescens NOT DETECTED NOT DETECTED Final   Haemophilus influenzae NOT DETECTED NOT DETECTED Final   Neisseria meningitidis NOT DETECTED NOT DETECTED Final   Pseudomonas aeruginosa NOT DETECTED NOT DETECTED Final   Stenotrophomonas maltophilia NOT DETECTED NOT DETECTED Final   Candida albicans NOT DETECTED NOT DETECTED Final   Candida auris NOT DETECTED NOT DETECTED Final   Candida glabrata NOT DETECTED NOT DETECTED Final   Candida krusei NOT DETECTED NOT DETECTED Final   Candida parapsilosis NOT DETECTED NOT DETECTED Final   Candida tropicalis NOT DETECTED NOT DETECTED Final   Cryptococcus neoformans/gattii NOT DETECTED NOT DETECTED Final   Meth resistant mecA/C and MREJ DETECTED (A) NOT DETECTED Final    Comment: CRITICAL RESULT CALLED TO, READ BACK BY AND VERIFIED WITH: Ardeen Garland, PHARMD AT 0725 ON 09/23/21 BY GM Performed at [facility], [place], [place] [address]   Group A Strep by PCR [facility] Only)     Status: None   Collection Time: 09/22/21  1:40 PM   Specimen: Throat; Sterile Swab  Result Value Ref Range Status   Group A Strep by PCR NOT DETECTED NOT DETECTED Final    Comment: Performed at [facility], [address] [place], [place] [address]  Peritoneal fluid culture w Gram Stain     Status: None   Collection Time: 09/22/21  7:29 PM   Specimen: Peritoneal Washings; Peritoneal Fluid  Result Value Ref Range Status   Specimen Description   Final    PERITONEAL Performed at [facility], [address] [place], [place] [address]    Special Requests   Final    NONE Performed at [facility], [address] [place], [place] [address]    Gram Stain   Final    FEW WBC PRESENT, PREDOMINANTLY MONONUCLEAR NO ORGANISMS SEEN    Culture   Final    NO GROWTH 3 DAYS Performed at [facility], [place] [address], [place], [place] [address]    Report Status 09/26/2021 FINAL  Final  Gastrointestinal Panel by PCR , Stool     Status: None   Collection Time: 09/23/21  4:45 AM   Specimen: Stool  Result Value Ref Range Status   Campylobacter species NOT DETECTED NOT DETECTED Final   Plesimonas shigelloides NOT DETECTED NOT DETECTED Final   Salmonella species NOT DETECTED NOT DETECTED Final   Yersinia enterocolitica NOT DETECTED NOT DETECTED Final   Vibrio species NOT DETECTED NOT DETECTED Final   Vibrio cholerae NOT DETECTED NOT DETECTED Final   Enteroaggregative E coli (EAEC) NOT DETECTED NOT DETECTED Final   Enteropathogenic E coli (EPEC) NOT DETECTED NOT DETECTED Final   Enterotoxigenic E coli (ETEC) NOT DETECTED NOT DETECTED Final   Shiga like toxin producing E coli (STEC) NOT DETECTED NOT DETECTED Final   Shigella/Enteroinvasive E coli (EIEC) NOT DETECTED NOT DETECTED Final   Cryptosporidium NOT DETECTED NOT DETECTED Final   Cyclospora cayetanensis NOT DETECTED NOT DETECTED Final    Entamoeba histolytica NOT DETECTED NOT DETECTED Final   Giardia lamblia NOT DETECTED NOT DETECTED Final   Adenovirus F40/41 NOT DETECTED NOT DETECTED Final   Astrovirus NOT DETECTED NOT DETECTED Final   Norovirus GI/GII NOT DETECTED NOT DETECTED Final   Rotavirus A NOT DETECTED NOT DETECTED  Final   Sapovirus (I, II, IV, and V) NOT DETECTED NOT DETECTED Final    Comment: Performed at [facility], [place], [place] [address]  C Difficile Quick Screen w PCR reflex     Status: None   Collection Time: 09/23/21  4:45 AM   Specimen: Stool  Result Value Ref Range Status   C Diff antigen NEGATIVE NEGATIVE Final   C Diff toxin NEGATIVE NEGATIVE Final   C Diff interpretation No C. difficile detected.  Final    Comment: Performed at [facility], [address] [place], [place] [address]  CULTURE, BLOOD (ROUTINE X 2) w Reflex to ID Panel     Status: None   Collection Time: 09/25/21  6:12 AM   Specimen: BLOOD  Result Value Ref Range Status   Specimen Description BLOOD LEFT ARM  Final   Special Requests   Final    BOTTLES DRAWN AEROBIC AND ANAEROBIC Blood Culture adequate volume   Culture   Final    NO GROWTH 5 DAYS Performed at [facility], [address] [place], [place] [address]    Report Status 09/30/2021 FINAL  Final  CULTURE, BLOOD (ROUTINE X 2) w Reflex to ID Panel     Status: None   Collection Time: 09/25/21  6:12 AM   Specimen: BLOOD  Result Value Ref Range Status   Specimen Description BLOOD RIGHT HAND  Final   Special Requests   Final    BOTTLES DRAWN AEROBIC AND ANAEROBIC Blood Culture adequate volume   Culture   Final    NO GROWTH 5 DAYS Performed at [facility], [address] [place], [place] [address]    Report Status 09/30/2021 FINAL  Final  Aerobic/Anaerobic Culture w Gram Stain (surgical/deep wound)     Status: None (Preliminary result)   Collection Time: 09/26/21  2:58 PM   Specimen: Foot; Wound  Result  Value Ref Range Status   Specimen Description   Final    FOOT LEFT Performed at [facility], [address] [place], [place] [address]    Special Requests   Final    NONE Performed at [facility], [place], [place] [address]    Gram Stain   Final    NO SQUAMOUS EPITHELIAL CELLS SEEN FEW WBC SEEN FEW GRAM POSITIVE COCCI Performed at [facility], [place] [address] [place], [place] [address]    Culture   Final    MODERATE METHICILLIN RESISTANT STAPHYLOCOCCUS AUREUS NO ANAEROBES ISOLATED; CULTURE IN PROGRESS FOR 5 DAYS    Report Status PENDING  Incomplete   Organism ID, Bacteria METHICILLIN RESISTANT STAPHYLOCOCCUS AUREUS  Final      Susceptibility   Methicillin resistant staphylococcus aureus - MIC*    CIPROFLOXACIN 2 INTERMEDIATE Intermediate     ERYTHROMYCIN >=8 RESISTANT Resistant     GENTAMICIN <=0.5 SENSITIVE Sensitive     OXACILLIN >=4 RESISTANT Resistant     TETRACYCLINE <=1 SENSITIVE Sensitive     VANCOMYCIN 1 SENSITIVE Sensitive     TRIMETH/SULFA <=10 SENSITIVE Sensitive     CLINDAMYCIN <=0.25 SENSITIVE Sensitive     RIFAMPIN <=0.5 SENSITIVE Sensitive     Inducible Clindamycin NEGATIVE Sensitive     * MODERATE METHICILLIN RESISTANT STAPHYLOCOCCUS AUREUS  Surgical PCR screen     Status: Abnormal   Collection Time: 09/27/21  4:43 AM   Specimen: Nasal Mucosa; Nasal Swab  Result Value Ref Range Status   MRSA, PCR  POSITIVE (A) NEGATIVE Final    Comment: RESULT CALLED TO, READ BACK BY AND VERIFIED WITH: CHARLOTTE KYEI AT 4315 ON 09/27/21 [place].    Staphylococcus aureus POSITIVE (A) NEGATIVE Final    Comment: (NOTE) The Xpert SA Assay (FDA approved for NASAL specimens in patients 73 years of age and older), is one component of a comprehensive surveillance program. It is not intended to diagnose infection nor to guide or monitor treatment. Performed at [facility], [address] [place], [place] [address]    Aerobic/Anaerobic Culture w Gram Stain (surgical/deep wound)     Status: None (Preliminary result)   Collection Time: 09/27/21  9:17 AM   Specimen: PATH Digit amputation; Tissue  Result Value Ref Range Status   Specimen Description   Final    WOUND Performed at [facility], [address] [place], [place] [address]    Special Requests   Final    DIGIT Performed at [facility], [place], [place] [address]    Gram Stain   Final    NO SQUAMOUS EPITHELIAL CELLS SEEN FEW WBC SEEN FEW GRAM POSITIVE COCCI Performed at [facility], [place] [address] [place], [place] [address]    Culture   Final    FEW METHICILLIN RESISTANT STAPHYLOCOCCUS AUREUS NO ANAEROBES ISOLATED; CULTURE IN PROGRESS FOR 5 DAYS    Report Status PENDING  Incomplete   Organism ID, Bacteria METHICILLIN RESISTANT STAPHYLOCOCCUS AUREUS  Final      Susceptibility   Methicillin resistant staphylococcus aureus - MIC*    CIPROFLOXACIN 2 INTERMEDIATE Intermediate     ERYTHROMYCIN >=8 RESISTANT Resistant     GENTAMICIN <=0.5 SENSITIVE Sensitive     OXACILLIN >=4 RESISTANT Resistant     TETRACYCLINE <=1 SENSITIVE Sensitive     VANCOMYCIN <=0.5 SENSITIVE Sensitive     TRIMETH/SULFA <=10 SENSITIVE Sensitive     CLINDAMYCIN <=0.25 SENSITIVE Sensitive     RIFAMPIN <=0.5 SENSITIVE Sensitive     Inducible Clindamycin NEGATIVE Sensitive     * FEW METHICILLIN RESISTANT STAPHYLOCOCCUS AUREUS    Coagulation Studies: No results for input(s): LABPROT, INR in the last 72 hours.   Urinalysis: No results for input(s): COLORURINE, LABSPEC, PHURINE, GLUCOSEU, HGBUR, BILIRUBINUR, KETONESUR, PROTEINUR, UROBILINOGEN, NITRITE, LEUKOCYTESUR in the last 72 hours.  Invalid input(s): APPERANCEUR    Imaging: IR US Guidance  Result Date: 09/29/2021 CLINICAL DATA:  Infection of left foot with osteomyelitis of the first toe requiring amputation. The patient requires a tunneled central venous  catheter for home IV antibiotics and has a history of end-stage renal disease and previous right arm AV fistula placement. EXAM: TUNNELED CENTRAL VENOUS CATHETER PLACEMENT WITH ULTRASOUND AND FLUOROSCOPIC GUIDANCE ANESTHESIA/SEDATION: Moderate (conscious) sedation was employed during this procedure. A total of Versed 1.0 mg and Fentanyl 50 mcg was administered intravenously by radiology nursing under my supervision. Moderate Sedation Time: 18 minutes. The patient's level of consciousness and vital signs were monitored continuously by radiology nursing throughout the procedure under my direct supervision. MEDICATIONS: None FLUOROSCOPY TIME:  24 seconds.  3.7 mGy. PROCEDURE: The procedure, risks, benefits, and alternatives were explained to the patient. Questions regarding the procedure were encouraged and answered. The patient understands and consents to the procedure. A timeout was performed prior to initiating the procedure. Ultrasound was used to check for patency of the internal jugular veins bilaterally. Ultrasound image recording was performed of the left internal jugular vein. Left neck and chest were prepped with chlorhexidine in a sterile  fashion, and a sterile drape was applied covering the operative field. Maximum barrier sterile technique with sterile gowns and gloves were used for the procedure. Local anesthesia was provided with 1% lidocaine. After creating a small venotomy incision, a 21 gauge needle was advanced into the left internal jugular vein under direct, real-time ultrasound guidance with permanent image recording. Ultrasound image documentation was performed. After securing guidewire access, a 6 French peel-away sheath was placed. A wire was kinked to measure appropriate catheter length. A 5 French, single-lumen tunneled power line was chosen for placement. This was tunneled in a retrograde fashion from the chest wall to the venotomy incision. The catheter was cut to 25 cm. The catheter was  then placed through the sheath and the sheath removed. Final catheter positioning was confirmed and documented with a fluoroscopic spot image. The catheter was aspirated, flushed with saline and flushed with heparinized saline. The venotomy incision was closed with subcuticular 4-0 Vicryl. Dermabond was applied to the incision. The catheter exit site was secured with Prolene retention sutures. COMPLICATIONS: None.  No pneumothorax. 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 Patent right external jugular vein is present. The left internal jugular vein is normally patent. After catheter placement, the tip lies in the SVC. The catheter aspirates normally and is ready for immediate use. IMPRESSION: Placement of tunneled central venous catheter via the left internal jugular vein. The catheter tip lies in the SVC. The catheter is ready for immediate use. 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 Electronically Signed   By: Aletta Edouard M.D.   On: 09/29/2021 16:20   IR TUNNELED CENTRAL VENOUS CATHETER PLACEMENT  Result Date: 09/29/2021 CLINICAL DATA:  Infection of left foot with osteomyelitis of the first toe requiring amputation. The patient requires a tunneled central venous catheter for home IV antibiotics and has a history of end-stage renal disease and previous right arm AV fistula placement. EXAM: TUNNELED CENTRAL VENOUS CATHETER PLACEMENT WITH ULTRASOUND AND FLUOROSCOPIC GUIDANCE ANESTHESIA/SEDATION: Moderate (conscious) sedation was employed during this procedure. A total of Versed 1.0 mg and Fentanyl 50 mcg was administered intravenously by radiology nursing under my supervision. Moderate Sedation Time: 18 minutes. The patient's level of consciousness and vital signs were monitored continuously by radiology nursing throughout the procedure under my  direct supervision. MEDICATIONS: None FLUOROSCOPY TIME:  24 seconds.  3.7 mGy. PROCEDURE: The procedure, risks, benefits, and alternatives were explained to the patient. Questions regarding the procedure were encouraged and answered. The patient understands and consents to the procedure. A timeout was performed prior to initiating the procedure. Ultrasound was used to check for patency of the internal jugular veins bilaterally. Ultrasound image recording was performed of the left internal jugular vein. Left neck and chest were prepped with chlorhexidine in a sterile fashion, and a sterile drape was applied covering the operative field. Maximum barrier sterile technique with sterile gowns and gloves were used for the procedure. Local anesthesia was provided with 1% lidocaine. After creating a small venotomy incision, a 21 gauge needle was advanced into the left internal jugular vein under direct, real-time ultrasound guidance with permanent image recording. Ultrasound image documentation was performed. After securing guidewire access, a 6 French peel-away sheath was placed. A wire was kinked to measure appropriate catheter length. A 5 French, single-lumen tunneled power line was chosen for placement. This was tunneled in a retrograde fashion from the chest wall to the venotomy  incision. The catheter was cut to 25 cm. The catheter was then placed through the sheath and the sheath removed. Final catheter positioning was confirmed and documented with a fluoroscopic spot image. The catheter was aspirated, flushed with saline and flushed with heparinized saline. The venotomy incision was closed with subcuticular 4-0 Vicryl. Dermabond was applied to the incision. The catheter exit site was secured with Prolene retention sutures. COMPLICATIONS: None.  No pneumothorax. 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  Patent right external jugular vein is present. The left internal jugular vein is normally patent. After catheter placement, the tip lies in the SVC. The catheter aspirates normally and is ready for immediate use. IMPRESSION: Placement of tunneled central venous catheter via the left internal jugular vein. The catheter tip lies in the SVC. The catheter is ready for immediate use. 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 Electronically Signed   By: Aletta Edouard M.D.   On: 09/29/2021 12:42     Medications:    sodium chloride     DAPTOmycin (CUBICIN)  IV Stopped (09/28/21 1550)   dialysis solution 1.5% low-MG/low-CA     dialysis solution 2.5% low-MG/low-CA      sodium chloride   Intravenous Once   sodium chloride   Intravenous Once   abacavir  300 mg Oral BID   amLODipine  5 mg Oral Daily   carvedilol  25 mg Oral BID   Chlorhexidine Gluconate Cloth  6 each Topical Q0600   dolutegravir  50 mg Oral QHS   gentamicin cream  1 application Topical Daily   heparin  5,000 Units Subcutaneous Q8H   insulin aspart  0-6 Units Subcutaneous TID WC   [employer] Standard 1.0  325 mL Oral BID   lamiVUDine  25 mg Oral QHS   losartan  100 mg Oral QPM   multivitamin  1 tablet Oral Daily   mupirocin ointment  1 application Nasal BID   potassium chloride  20 mEq Oral Daily   sodium chloride flush  3 mL Intravenous Q12H   sodium chloride, acetaminophen, loperamide, oxyCODONE, sodium chloride flush  Assessment/ Plan:  Nathan Ayers is a 51 y.o. black male with end stage renal disease on peritoneal dialysis, HIV on HART, hypertension, diabetes mellitus type II, diabetic neuropathy who is admitted to [facility] on 09/22/2021 on Leukocytosis [D72.829] Uremia [N19] Generalized abdominal pain [R10.84] ESRD on peritoneal dialysis ([place]) [N18.6, Z99.2] Acute sepsis ([place]) [A41.9]  [facility] 102.5kg CCPD 9 hours 5 exchanges 2.7 liter fills    End stage renal disease: no indication that patient has peritonitis.  - Continue peritoneal dialysis.  -Confirmed with patient and clinic availability at discharge to remove transfer set placed during hospital  Hypertension:  -Currently on carvedilol.   - Losartan restarted yesterday  Anemia with chronic kidney disease: hemoglobin 7.0. normocytic. Mircera as outpatient.  Scheduled EPO dose as inpatient 09/28/21 Will order EPO 20,000 units weekly subQ.  Diabetes mellitus type II with chronic kidney disease: continue glucose control. Hemoglobin A1c of 6.2%  Hypokalemia -  stable  Left foot osteomyelitis with Sepsis with bacteremia and fever: MRSA in blood cultures on 09/22/21.   - Status post amputation on 11/20.  - IV daptomycin x 6 weeks. Recommended dose 800 mg iv every 48 hrs.End date 11/01/2021 Lt PICC placed on 09/29/21 for antibiotics - appreciate podiatry, ID and cardiology input.   -  Will have I&D later today.    LOS: 6 Yeily Link 11/23/202211:54 AM

## 2021-09-30 NOTE — Assessment & Plan Note (Addendum)
-   Continue home regimen

## 2021-09-30 NOTE — Progress Notes (Signed)
OT Cancellation Note  Patient Details Name: Nathan Ayers MRN: [id number] DOB: 03/19/1971   Cancelled Treatment:    Reason Eval/Treat Not Completed: Patient at procedure or test/ unavailable. Pt noted to be off the floor for procedure, unavailable at this time. Will continue to follow POC at later date/time as pt available.   Dessie Coma, M.S. OTR/L  09/30/21, 4:00 PM  ascom [phone]

## 2021-09-30 NOTE — Anesthesia Procedure Notes (Signed)
Date/Time: 09/30/2021 4:41 PM Performed by: Lily Peer, Jacqui Headen, CRNA Pre-anesthesia Checklist: Patient identified, Emergency Drugs available, Suction available and Patient being monitored Patient Re-evaluated:Patient Re-evaluated prior to induction Oxygen Delivery Method: Simple face mask Induction Type: IV induction

## 2021-09-30 NOTE — Assessment & Plan Note (Addendum)
-   see bacteremia as well - s/p left partial first ray amputation on 09/27/2021. Due to ongoing purulent drainage he underwent repeat I&D on 11/23 as well - he is recommended for NWB to left foot per podiatry; outpt follow up on 12/13 -No further packing required at discharge.  Patient transitioned to bulky dressing to remain intact and in place until podiatry follow-up - continue daptomycin

## 2021-09-30 NOTE — Progress Notes (Signed)
PT Cancellation Note  Patient Details Name: Nathan Ayers MRN: [id number] DOB: 1971-10-15   Cancelled Treatment:     Pt off floor undergoing I&D of L foot.  Continue PT per POC at next available date/time.   Josie Dixon 09/30/2021, 4:21 PM

## 2021-09-30 NOTE — Anesthesia Preprocedure Evaluation (Signed)
Anesthesia Evaluation  Patient identified by MRN, date of birth, ID band Patient awake    Reviewed: Allergy & Precautions, NPO status , Patient's Chart, lab work & pertinent test results, reviewed documented beta blocker date and time   History of Anesthesia Complications Negative for: history of anesthetic complications  Airway Mallampati: II  TM Distance: >3 FB Neck ROM: full    Dental  (+) Teeth Intact   Pulmonary shortness of breath, sleep apnea , neg COPD, Patient abstained from smoking.Not current smoker,  SOB has improved since hospital admission.  Neg CXR.   Pulmonary exam normal breath sounds clear to auscultation       Cardiovascular Exercise Tolerance: Good METShypertension, Pt. on medications and Pt. on home beta blockers + Peripheral Vascular Disease  (-) CAD and (-) Past MI (-) dysrhythmias  Rhythm:Regular Rate:Normal  TEE 09/24/2021: 1. No valve endocarditis  2. Left ventricular ejection fraction, by estimation, is 60 to 65%. The  left ventricle has normal function. The left ventricle has no regional  wall motion abnormalities.  3. Right ventricular systolic function is normal. The right ventricular  size is normal.  4. No left atrial/left atrial appendage thrombus was detected.  5. The mitral valve is normal in structure. No evidence of mitral valve  regurgitation. No evidence of mitral stenosis.  6. The aortic valve is normal in structure. Aortic valve regurgitation is  trivial. Aortic valve sclerosis is present, with no evidence of aortic  valve stenosis.  7. There is mild (Grade II) atheroma plaque involving the descending  aorta.  8. The inferior vena cava is normal in size with greater than 50%  respiratory variability, suggesting right atrial pressure of 3 mmHg.  9. Agitated saline contrast bubble study was negative, with no evidence  of any interatrial shunt.    Neuro/Psych PSYCHIATRIC  DISORDERS Anxiety negative neurological ROS     GI/Hepatic negative GI ROS, Neg liver ROS, neg GERD  ,  Endo/Other  diabetes, Type 2  Renal/GU ESRF and DialysisRenal diseasePD daily, dialyzed yesterday  negative genitourinary   Musculoskeletal L foot infection   Abdominal   Peds  Hematology  (+) HIV, REFUSES BLOOD PRODUCTS,   Anesthesia Other Findings Past Medical History: No date: Anxiety No date: Chronic kidney disease (CKD), active medical management  without dialysis     Comment:  Mon, Wed and Fri No date: Chronic kidney disease on chronic dialysis ([place]) No date: Diabetes mellitus ([place]) No date: Diabetic neuropathy ([place]) No date: Dyspnea No date: HIV infection ([place]) No date: Hypertension   Reproductive/Obstetrics negative OB ROS                             Anesthesia Physical  Anesthesia Plan  ASA: 4  Anesthesia Plan: General   Post-op Pain Management: Minimal or no pain anticipated   Induction: Intravenous  PONV Risk Score and Plan: 2 and Ondansetron, Propofol infusion, TIVA and Midazolam  Airway Management Planned: Natural Airway  Additional Equipment: None  Intra-op Plan:   Post-operative Plan: Extubation in OR  Informed Consent: I have reviewed the patients History and Physical, chart, labs and discussed the procedure including the risks, benefits and alternatives for the proposed anesthesia with the patient or authorized representative who has indicated his/her understanding and acceptance.     Dental advisory given  Plan Discussed with: CRNA  Anesthesia Plan Comments: (Discussed risks of anesthesia with patient, including possibility of difficulty with spontaneous ventilation under  anesthesia necessitating airway intervention, PONV, and rare risks such as cardiac or respiratory or neurological events, and allergic reactions. Discussed the role of CRNA in patient's perioperative care. Patient understands.)         Anesthesia Quick Evaluation

## 2021-09-30 NOTE — Assessment & Plan Note (Addendum)
-   on PD outpatient - no peritonitis on workup on admission as well

## 2021-09-30 NOTE — Anesthesia Postprocedure Evaluation (Signed)
Anesthesia Post Note  Patient: Nathan Ayers  Procedure(s) Performed: INCISION AND DRAINAGE (Left: Foot)  Patient location during evaluation: PACU Anesthesia Type: General Level of consciousness: awake and alert Pain management: pain level controlled Vital Signs Assessment: post-procedure vital signs reviewed and stable Respiratory status: spontaneous breathing, nonlabored ventilation, respiratory function stable and patient connected to nasal cannula oxygen Cardiovascular status: blood pressure returned to baseline and stable Postop Assessment: no apparent nausea or vomiting Anesthetic complications: no   No notable events documented.   Last Vitals:  Vitals:   09/30/21 1745 09/30/21 1817  BP: (!) 158/72 (!) 168/70  Pulse: 73 76  Resp: 17 18  Temp: 36.5 C 36.5 C  SpO2: 95% 100%    Last Pain:  Vitals:   09/30/21 1817  TempSrc: Oral  PainSc:                  Arita Miss

## 2021-09-30 NOTE — Assessment & Plan Note (Signed)
-   CD4 302 - followed at [facility] per notes - continue ART

## 2021-09-30 NOTE — Transfer of Care (Signed)
Immediate Anesthesia Transfer of Care Note  Patient: Nathan Ayers  Procedure(s) Performed: INCISION AND DRAINAGE (Left: Foot)  Patient Location: PACU  Anesthesia Type:General  Level of Consciousness: drowsy  Airway & Oxygen Therapy: Patient Spontanous Breathing  Post-op Assessment: Report given to RN and Post -op Vital signs reviewed and stable  Post vital signs: Reviewed and stable  Last Vitals:  Vitals Value Taken Time  BP 131/61   Temp    Pulse 71   Resp 16   SpO2 96     Last Pain:  Vitals:   09/30/21 1601  TempSrc: Temporal  PainSc: 0-No pain      Patients Stated Pain Goal: 0 (22/44/97 5300)  Complications: No notable events documented.

## 2021-09-30 NOTE — Assessment & Plan Note (Signed)
-   Initially low/normal blood pressure and home meds held which have been slowly resumed

## 2021-09-30 NOTE — Plan of Care (Signed)

## 2021-09-30 NOTE — Progress Notes (Signed)
Progress Note    Nathan Ayers   SPQ:[id number]  DOB: August 17, 1971  DOA: 09/22/2021     6 PCP: Nathan Cornfield, MD  Initial CC: [facility] Course: Nathan Ayers is a 50  yo male with PMH ESRD on PD, HIV, DM1, OSA, HTN who presented with generalized malaise.  He was admitted for further work-up regarding possible sources of infection. Ultimately he developed purulent drainage from his left foot and further work-up of this showed acute osteomyelitis with underlying first MTP joint effusion concerning for septic arthritis as well as an underlying soft tissue abscess. He was evaluated by podiatry and underwent left partial first ray amputation on 09/27/2021. Ongoing work-up also revealed MRSA bacteremia during hospitalization.  He underwent evaluation by ID and also underwent endocarditis work-up with TEE.  He was recommended to continue on antibiotic coverage for 4 weeks.  He was treated with vancomycin initially which was transitioned to daptomycin for ease of administration prior to discharge. Due to ongoing purulent drainage from his wound, he required repeat I&D with podiatry on 09/30/2021.  Interval History:  No events overnight.  Seen in bed this morning prior to repeat I&D.  Denied fevers, chills, worsening pain.  Assessment & Plan: * Bacteremia due to Staphylococcus aureus - 11/15 blood culture positive for MRSA; repeat blood culture from 09/25/21 is negative - followed by ID - s/p TTE on 11/15 and TEE on 11/17; no signs of endocarditis or veg - s/p vanc, now on daptomycin for 4 week course; underwent tunneled left IJ placement with IR on 11/22 for outpatient abx  Osteomyelitis ([place]) - see bacteremia as well - s/p left partial first ray amputation on 09/27/2021. Due to ongoing purulent drainage, plan is repeat I&D on 11/23 as well - continue daptomycin   ESRD on dialysis ([place]) - on PD outpatient - no peritonitis on workup on admission as well   OSA (obstructive  sleep apnea) - not on CPAP at home   HIV disease ([place]) - CD4 302 - followed at [place] per notes - continue ART  Essential hypertension - Initially low/normal blood pressure and home meds held which have been slowly resumed  Type 1 diabetes ([place]) - continue SSI and CBG monitoring     Old records reviewed in assessment of this patient  Antimicrobials:  Abacavir 09/22/2021 >> current  Dolutegravir 09/22/2021 >> current Lamivudine 09/23/2021 >> current   Cefepime 09/22/2021 x 1 Flagyl 09/22/2021 x 1 Vancomycin 09/22/2021 >> 09/23/21 Dapto 09/28/21 >> current   DVT prophylaxis: HSQ  Code Status:   Code Status: Full Code  Disposition Plan:   Status is: Inpatient  Objective: Blood pressure (!) 190/87, pulse 78, temperature (!) 97.1 F (36.2 C), temperature source Temporal, resp. rate 18, height 6' 6"  (1.981 m), weight 101 kg, SpO2 98 %.  Examination:  Physical Exam Constitutional:      General: He is not in acute distress.    Appearance: Normal appearance.  HENT:     Head: Normocephalic and atraumatic.     Mouth/Throat:     Mouth: Mucous membranes are moist.  Eyes:     Extraocular Movements: Extraocular movements intact.  Neck:     Comments: Left tunnled IJ noted Cardiovascular:     Rate and Rhythm: Normal rate and regular rhythm.     Heart sounds: Normal heart sounds.  Pulmonary:     Effort: Pulmonary effort is normal. No respiratory distress.     Breath sounds: Normal breath sounds. No wheezing.  Abdominal:     General: Bowel sounds are normal. There is no distension.     Palpations: Abdomen is soft.     Tenderness: There is no abdominal tenderness.     Comments: PD cath in place  Musculoskeletal:     Cervical back: Normal range of motion and neck supple.     Comments: Left foot dressed with bandage   Skin:    General: Skin is warm and dry.  Neurological:     General: No focal deficit present.     Mental Status: He is alert.  Psychiatric:        Mood  and Affect: Mood normal.        Behavior: Behavior normal.     Consultants:  Podiatry ID  Procedures:  Left partial first ray amputation on 09/27/2021  Data Reviewed: I have personally reviewed labs and imaging studies     LOS: 6 days  Time spent: Greater than 50% of the 35 minute visit was spent in counseling/coordination of care for the patient as laid out in the A&P.   Dwyane Dee, MD [facility] 09/30/2021, 4:13 PM

## 2021-09-30 NOTE — Op Note (Signed)
Operative note   Surgeon:Cruzita Lipa Lawyer: None    Preop diagnosis: Residual infection status post first ray imitation left foot    Postop diagnosis: Same    Procedure: Incision and drainage infection left first ray amputation    EBL: Minimal    Anesthesia:local and IV sedation.  Local consisted of a total of 10 cc of lidocaine with epinephrine    Hemostasis: Lidocaine with epinephrine    Specimen: None    Complications: None    Operative indications:Nathan Ayers is an 49 y.o. that presents today for surgical intervention.  The risks/benefits/alternatives/complications have been discussed and consent has been given.    Procedure:  Patient was brought into the OR and placed on the operating table in thesupine position. After anesthesia was obtained theleft lower extremity was prepped and draped in usual sterile fashion.  Attention was directed to the left foot where the previous surgical site was noted from the previous first ray amputation.  Multiple sutures were noted.  These were then removed along the medial aspect of the foot.  The most distal one third of the incision the most proximal one third incision was left intact.  After removal of the sutures a large amount hematoma and scant purulence was noted in the area.  Also residual Surgicel was found.  1000 cc of sterile saline was used to irrigate the wound with a bulb syringe.  Minimal bleeding was noted at this time.  Further inspection did not reveal any further purulence or infectious process at this point.  Closure was then performed to the wound with a 2-0 nylon.  Large bulky sterile dressing was applied.    Patient tolerated the procedure and anesthesia well.  Was transported from the OR to the PACU with all vital signs stable and vascular status intact. To be discharged per routine protocol.  Will follow up in approximately 1 week in the outpatient clinic.

## 2021-10-01 ENCOUNTER — Encounter: Payer: Self-pay | Admitting: Podiatry

## 2021-10-01 DIAGNOSIS — N2581 Secondary hyperparathyroidism of renal origin: Secondary | ICD-10-CM | POA: Diagnosis not present

## 2021-10-01 DIAGNOSIS — N2589 Other disorders resulting from impaired renal tubular function: Secondary | ICD-10-CM | POA: Diagnosis not present

## 2021-10-01 DIAGNOSIS — Z79899 Other long term (current) drug therapy: Secondary | ICD-10-CM | POA: Diagnosis not present

## 2021-10-01 DIAGNOSIS — R17 Unspecified jaundice: Secondary | ICD-10-CM | POA: Diagnosis not present

## 2021-10-01 DIAGNOSIS — D509 Iron deficiency anemia, unspecified: Secondary | ICD-10-CM | POA: Diagnosis not present

## 2021-10-01 DIAGNOSIS — E440 Moderate protein-calorie malnutrition: Secondary | ICD-10-CM | POA: Diagnosis not present

## 2021-10-01 DIAGNOSIS — D631 Anemia in chronic kidney disease: Secondary | ICD-10-CM | POA: Diagnosis not present

## 2021-10-01 DIAGNOSIS — Z992 Dependence on renal dialysis: Secondary | ICD-10-CM | POA: Diagnosis not present

## 2021-10-01 DIAGNOSIS — N186 End stage renal disease: Secondary | ICD-10-CM | POA: Diagnosis not present

## 2021-10-01 LAB — GLUCOSE, CAPILLARY
Glucose-Capillary: 118 mg/dL — ABNORMAL HIGH (ref 70–99)
Glucose-Capillary: 114 mg/dL — ABNORMAL HIGH (ref 70–99)
Glucose-Capillary: 103 mg/dL — ABNORMAL HIGH (ref 70–99)
Glucose-Capillary: 174 mg/dL — ABNORMAL HIGH (ref 70–99)

## 2021-10-01 LAB — TYPE AND SCREEN
ABO/RH(D): B POS
Antibody Screen: NEGATIVE
Unit division: 0
Unit division: 0

## 2021-10-01 LAB — BASIC METABOLIC PANEL
Anion gap: 13 (ref 5–15)
BUN: 74 mg/dL — ABNORMAL HIGH (ref 6–20)
CO2: 23 mmol/L (ref 22–32)
Calcium: 8.5 mg/dL — ABNORMAL LOW (ref 8.9–10.3)
Chloride: 95 mmol/L — ABNORMAL LOW (ref 98–111)
Creatinine, Ser: 18.4 mg/dL — ABNORMAL HIGH (ref 0.61–1.24)
GFR, Estimated: 3 mL/min — ABNORMAL LOW (ref >60)
Glucose, Bld: 106 mg/dL — ABNORMAL HIGH (ref 70–99)
Potassium: 5 mmol/L (ref 3.5–5.1)
Sodium: 131 mmol/L — ABNORMAL LOW (ref 135–145)

## 2021-10-01 LAB — AEROBIC/ANAEROBIC CULTURE W GRAM STAIN (SURGICAL/DEEP WOUND): Gram Stain: NONE SEEN

## 2021-10-01 LAB — CBC WITH DIFFERENTIAL/PLATELET
Abs Immature Granulocytes: 0.31 10*3/uL — ABNORMAL HIGH (ref 0.00–0.07)
Basophils Absolute: 0.1 10*3/uL (ref 0.0–0.1)
Basophils Relative: 1 %
Eosinophils Absolute: 0.2 10*3/uL (ref 0.0–0.5)
Eosinophils Relative: 2 %
HCT: 24.7 % — ABNORMAL LOW (ref 39.0–52.0)
Hemoglobin: 8.6 g/dL — ABNORMAL LOW (ref 13.0–17.0)
Immature Granulocytes: 3 %
Lymphocytes Relative: 22 %
Lymphs Abs: 2 10*3/uL (ref 0.7–4.0)
MCH: 29.3 pg (ref 26.0–34.0)
MCHC: 34.8 g/dL (ref 30.0–36.0)
MCV: 84 fL (ref 80.0–100.0)
Monocytes Absolute: 0.8 10*3/uL (ref 0.1–1.0)
Monocytes Relative: 8 %
Neutro Abs: 5.8 10*3/uL (ref 1.7–7.7)
Neutrophils Relative %: 64 %
Platelets: 290 10*3/uL (ref 150–400)
RBC: 2.94 MIL/uL — ABNORMAL LOW (ref 4.22–5.81)
RDW: 14.3 % (ref 11.5–15.5)
WBC: 9.2 10*3/uL (ref 4.0–10.5)
nRBC: 0 % (ref 0.0–0.2)

## 2021-10-01 LAB — BPAM RBC
Blood Product Expiration Date: 202211252359
Blood Product Expiration Date: 202212122359
ISSUE DATE / TIME: 202211211212
ISSUE DATE / TIME: 202211231152
Unit Type and Rh: 7300
Unit Type and Rh: 7300

## 2021-10-01 LAB — MAGNESIUM: Magnesium: 2.4 mg/dL (ref 1.7–2.4)

## 2021-10-01 MED ORDER — MINOXIDIL 2.5 MG PO TABS
2.5000 mg | ORAL_TABLET | Freq: Two times a day (BID) | ORAL | Status: DC
  Administered 2021-10-01 – 2021-10-05 (×9): 2.5 mg via ORAL
  Filled 2021-10-01 (×10): qty 1

## 2021-10-01 MED ORDER — LABETALOL HCL 5 MG/ML IV SOLN
10.0000 mg | INTRAVENOUS | Status: DC | PRN
  Administered 2021-10-01: 10 mg via INTRAVENOUS
  Filled 2021-10-01: qty 4

## 2021-10-01 MED ORDER — HYDRALAZINE HCL 25 MG PO TABS: 25.0000 mg | ORAL_TABLET | ORAL | Status: DC | PRN

## 2021-10-01 MED ORDER — MINOXIDIL 2.5 MG PO TABS: 2.5000 mg | ORAL_TABLET | ORAL | Status: DC

## 2021-10-01 MED ORDER — AMLODIPINE BESYLATE 10 MG PO TABS
10.0000 mg | ORAL_TABLET | Freq: Every day | ORAL | Status: DC
  Administered 2021-10-01 – 2021-10-05 (×5): 10 mg via ORAL
  Filled 2021-10-01 (×5): qty 1

## 2021-10-01 MED ORDER — MINOXIDIL 2.5 MG PO TABS
2.5000 mg | ORAL_TABLET | ORAL | Status: DC
  Filled 2021-10-01: qty 1

## 2021-10-01 NOTE — Final Progress Note (Signed)
Exit site for pd cath without drainage, no s/s of infection noted, site cleansed with chlorahexidine, gentamycin applied with new dressing.

## 2021-10-01 NOTE — Progress Notes (Addendum)
Daily Progress Note   Subjective  - 1 Day Post-Op  F/u left foot I & D.  No complaints  Objective Vitals:   10/01/21 0500 10/01/21 0510 10/01/21 0750 10/01/21 0934  BP:  (!) 182/90 (!) 177/81 (!) 175/77  Pulse:  71 71 68  Resp:  16 18   Temp:  98.5 F (36.9 C) 97.7 F (36.5 C)   TempSrc:  Oral    SpO2:  100% 99%   Weight: 102.8 kg     Height:        Physical Exam: Incision intact.  Scant serosanguinous drainage.  Laboratory CBC    Component Value Date/Time   WBC 9.2 10/01/2021 0843   HGB 8.6 (L) 10/01/2021 0843   HGB 9.1 (L) 09/23/2021 0643   HCT 24.7 (L) 10/01/2021 0843   HCT 25.6 (L) 09/23/2021 0643   PLT 290 10/01/2021 0843   PLT 330 09/23/2021 0643    BMET    Component Value Date/Time   NA 131 (L) 10/01/2021 0843   K 5.0 10/01/2021 0843   CL 95 (L) 10/01/2021 0843   CO2 23 10/01/2021 0843   GLUCOSE 106 (H) 10/01/2021 0843   BUN 74 (H) 10/01/2021 0843   CREATININE 18.40 (H) 10/01/2021 0843   CREATININE 1.08 07/19/2013 1631   CALCIUM 8.5 (L) 10/01/2021 0843   GFRNONAA 3 (L) 10/01/2021 0843   GFRNONAA 85 07/19/2013 1631   GFRAA 12 (L) 02/10/2018 1154   GFRAA >89 07/19/2013 1631    Assessment/Planning: S/p I & D. S/p 1st ray amputation Osteomyelitis with MRSA bacterimia   Dressing changed. Wound is stable.  Will need dressing changes 2-3 x's per week.  Pack proximal incision with 1/4" packing. Should maintain NWB to left foot. F/U in outpt clinic in 2 weeks. Face to Face home health dressing change orders placed.  [name]  10/01/2021, 10:59 AM

## 2021-10-01 NOTE — Progress Notes (Signed)
[facility]  ROUNDING NOTE   Subjective:   Patient sitting up in bed Underwent incision and drainage of the infection & left first ray amputation Did not undergo dialysis because of the late procedure   Objective:  Vital signs in last 24 hours:  Temp:  [97.1 F (36.2 C)-98.5 F (36.9 C)] 97.7 F (36.5 C) (11/24 0750) Pulse Rate:  [68-79] 68 (11/24 0934) Resp:  [14-18] 18 (11/24 0750) BP: (133-193)/(65-90) 175/77 (11/24 0934) SpO2:  [95 %-100 %] 99 % (11/24 0750) Weight:  [101 kg-102.8 kg] 102.8 kg (11/24 0500)  Weight change:  Filed Weights   09/29/21 0500 09/30/21 1601 10/01/21 0500  Weight: 101 kg 101 kg 102.8 kg    Intake/Output: I/O last 3 completed shifts: In: 424.7 [I.V.:126.7; Blood:298] Out: 2 [Blood:2]   Intake/Output this shift:  No intake/output data recorded.  Physical Exam: General: NAD, sitting up in bed  Head: Normocephalic, atraumatic. Moist oral mucosal membranes  Lungs:  Clear bilaterally, normal effort  Heart: Regular rate and rhythm  Abdomen:  Soft, nontender, PD catheter  Extremities: No peripheral edema. Right foot dressings clean, dry and intact  Neurologic: Nonfocal, moving all four extremities  Skin: No lesions  Access: PD catheter, right radiocephalic AVF    Basic Metabolic Panel: Recent Labs  Lab 09/26/21 0455 09/27/21 0459 09/28/21 0411 09/29/21 0815 10/01/21 0843  NA 134* 133* 131* 133* 131*  K 3.6 3.6 3.9 4.0 5.0  CL 95* 94* 94* 95* 95*  CO2 24 23 26 24 23   GLUCOSE 224* 172* 120* 103* 106*  BUN 89* 88* 75* 68* 74*  CREATININE 18.71* 18.37* 17.06* 16.98* 18.40*  CALCIUM 8.7* 8.8* 8.4* 8.5* 8.5*  MG  --   --   --   --  2.4     Liver Function Tests: No results for input(s): AST, ALT, ALKPHOS, BILITOT, PROT, ALBUMIN in the last 168 hours.  No results for input(s): LIPASE, AMYLASE in the last 168 hours. No results for input(s): AMMONIA in the last 168 hours.  CBC: Recent Labs  Lab 09/27/21 0459  09/28/21 0411 09/29/21 0815 09/30/21 0520 10/01/21 0843  WBC 13.9* 11.1* 10.2 9.0 9.2  NEUTROABS  --   --   --   --  5.8  HGB 7.8* 6.9* 7.4* 7.0* 8.6*  HCT 22.9* 19.9* 21.7* 20.8* 24.7*  MCV 83.3 84.0 83.5 83.9 84.0  PLT 340 303 280 259 290     Cardiac Enzymes: Recent Labs  Lab 09/28/21 0411  CKTOTAL 70     BNP: Invalid input(s): POCBNP  CBG: Recent Labs  Lab 09/30/21 1554 09/30/21 1718 09/30/21 2056 10/01/21 0751 10/01/21 1136  GLUCAP 87 90 123* 118* 114*     Microbiology: Results for orders placed or performed during the hospital encounter of 09/22/21  Resp Panel by RT-PCR (Flu A&B, Covid) Nasopharyngeal Swab     Status: None   Collection Time: 09/22/21 11:30 AM   Specimen: Nasopharyngeal Swab; Nasopharyngeal(NP) swabs in vial transport medium  Result Value Ref Range Status   SARS Coronavirus 2 by RT PCR NEGATIVE NEGATIVE Final    Comment: (NOTE) SARS-CoV-2 target nucleic acids are NOT DETECTED.  The SARS-CoV-2 RNA is generally detectable in upper respiratory specimens during the acute phase of infection. The lowest concentration of SARS-CoV-2 viral copies this assay can detect is 138 copies/mL. A negative result does not preclude SARS-Cov-2 infection and should not be used as the sole basis for treatment or other patient management decisions. A negative result  may occur with  improper specimen collection/handling, submission of specimen other than nasopharyngeal swab, presence of viral mutation(s) within the areas targeted by this assay, and inadequate number of viral copies(<138 copies/mL). A negative result must be combined with clinical observations, patient history, and epidemiological information. The expected result is Negative.  Fact Sheet for Patients:  EntrepreneurPulse.com.au  Fact Sheet for Healthcare Providers:  IncredibleEmployment.be  This test is no t yet approved or cleared by the [place] FDA  and  has been authorized for detection and/or diagnosis of SARS-CoV-2 by FDA under an Emergency Use Authorization (EUA). This EUA will remain  in effect (meaning this test can be used) for the duration of the COVID-19 declaration under Section 564(b)(1) of the Act, 21 U.S.C.section 360bbb-3(b)(1), unless the authorization is terminated  or revoked sooner.       Influenza A by PCR NEGATIVE NEGATIVE Final   Influenza B by PCR NEGATIVE NEGATIVE Final    Comment: (NOTE) The Xpert Xpress SARS-CoV-2/FLU/RSV plus assay is intended as an aid in the diagnosis of influenza from Nasopharyngeal swab specimens and should not be used as a sole basis for treatment. Nasal washings and aspirates are unacceptable for Xpert Xpress SARS-CoV-2/FLU/RSV testing.  Fact Sheet for Patients: EntrepreneurPulse.com.au  Fact Sheet for Healthcare Providers: IncredibleEmployment.be  This test is not yet approved or cleared by the [place] FDA and has been authorized for detection and/or diagnosis of SARS-CoV-2 by FDA under an Emergency Use Authorization (EUA). This EUA will remain in effect (meaning this test can be used) for the duration of the COVID-19 declaration under Section 564(b)(1) of the Act, 21 U.S.C. section 360bbb-3(b)(1), unless the authorization is terminated or revoked.  Performed at [facility], [place], [place] [address]   Respiratory (~20 pathogens) panel by PCR     Status: None   Collection Time: 09/22/21 11:30 AM   Specimen: Peritoneal Washings; Respiratory  Result Value Ref Range Status   Adenovirus NOT DETECTED NOT DETECTED Final   Coronavirus 229E NOT DETECTED NOT DETECTED Final    Comment: (NOTE) The Coronavirus on the Respiratory Panel, DOES NOT test for the novel  Coronavirus (2019 nCoV)    Coronavirus HKU1 NOT DETECTED NOT DETECTED Final   Coronavirus NL63 NOT DETECTED NOT DETECTED Final   Coronavirus OC43  NOT DETECTED NOT DETECTED Final   Metapneumovirus NOT DETECTED NOT DETECTED Final   Rhinovirus / Enterovirus NOT DETECTED NOT DETECTED Final   Influenza A NOT DETECTED NOT DETECTED Final   Influenza B NOT DETECTED NOT DETECTED Final   Parainfluenza Virus 1 NOT DETECTED NOT DETECTED Final   Parainfluenza Virus 2 NOT DETECTED NOT DETECTED Final   Parainfluenza Virus 3 NOT DETECTED NOT DETECTED Final   Parainfluenza Virus 4 NOT DETECTED NOT DETECTED Final   Respiratory Syncytial Virus NOT DETECTED NOT DETECTED Final   Bordetella pertussis NOT DETECTED NOT DETECTED Final   Bordetella Parapertussis NOT DETECTED NOT DETECTED Final   Chlamydophila pneumoniae NOT DETECTED NOT DETECTED Final   Mycoplasma pneumoniae NOT DETECTED NOT DETECTED Final    Comment: Performed at [facility], [place], [place], [place] [address]  Blood Culture (routine x 2)     Status: Abnormal   Collection Time: 09/22/21  1:32 PM   Specimen: BLOOD  Result Value Ref Range Status   Specimen Description   Final    BLOOD LEFT ANTECUBITAL Performed at [facility], [address], [place], [place] [address]    Special  Requests   Final    BOTTLES DRAWN AEROBIC AND ANAEROBIC Blood Culture results may not be optimal due to an excessive volume of blood received in culture bottles Performed at [facility], [address] [place], [place] [address]    Culture  Setup Time   Final    GRAM POSITIVE COCCI ANAEROBIC BOTTLE ONLY CRITICAL VALUE NOTED.  VALUE IS CONSISTENT WITH PREVIOUSLY REPORTED AND CALLED VALUE.    Culture (A)  Final    STAPHYLOCOCCUS AUREUS SUSCEPTIBILITIES PERFORMED ON PREVIOUS CULTURE WITHIN THE LAST 5 DAYS. Performed at [facility], [place] [address], [place], [place] [address]    Report Status 09/27/2021 FINAL  Final  Blood Culture (routine x 2)     Status: Abnormal   Collection Time: 09/22/21  1:32 PM   Specimen: BLOOD  Result Value Ref Range Status   Specimen  Description   Final    BLOOD LEFT ANTECUBITAL Performed at [facility], [address] [place], [place] [address]    Special Requests   Final    BOTTLES DRAWN AEROBIC AND ANAEROBIC Blood Culture adequate volume Performed at [facility], [address] [place], [place] [address]    Culture  Setup Time   Final    GRAM POSITIVE COCCI IN BOTH AEROBIC AND ANAEROBIC BOTTLES Organism ID to follow CRITICAL RESULT CALLED TO, READ BACK BY AND VERIFIED WITH: Ardeen Garland, PHARMD AT 0725 ON 09/23/21 BY GM Performed at [facility], [address] [place], [place] [address]    Culture METHICILLIN RESISTANT STAPHYLOCOCCUS AUREUS (A)  Final   Report Status 09/25/2021 FINAL  Final   Organism ID, Bacteria METHICILLIN RESISTANT STAPHYLOCOCCUS AUREUS  Final      Susceptibility   Methicillin resistant staphylococcus aureus - MIC*    CIPROFLOXACIN 2 INTERMEDIATE Intermediate     ERYTHROMYCIN >=8 RESISTANT Resistant     GENTAMICIN <=0.5 SENSITIVE Sensitive     OXACILLIN >=4 RESISTANT Resistant     TETRACYCLINE <=1 SENSITIVE Sensitive     VANCOMYCIN 1 SENSITIVE Sensitive     TRIMETH/SULFA <=10 SENSITIVE Sensitive     CLINDAMYCIN <=0.25 SENSITIVE Sensitive     RIFAMPIN <=0.5 SENSITIVE Sensitive     Inducible Clindamycin NEGATIVE Sensitive     * METHICILLIN RESISTANT STAPHYLOCOCCUS AUREUS  Blood Culture ID Panel (Reflexed)     Status: Abnormal   Collection Time: 09/22/21  1:32 PM  Result Value Ref Range Status   Enterococcus faecalis NOT DETECTED NOT DETECTED Final   Enterococcus Faecium NOT DETECTED NOT DETECTED Final   Listeria monocytogenes NOT DETECTED NOT DETECTED Final   Staphylococcus species DETECTED (A) NOT DETECTED Final    Comment: CRITICAL RESULT CALLED TO, READ BACK BY AND VERIFIED WITH: MORGAN HICKS, PHARMD AT 0725 ON 09/23/21 BY GM    Staphylococcus aureus (BCID) DETECTED (A) NOT DETECTED Final    Comment: Methicillin (oxacillin)-resistant  Staphylococcus aureus (MRSA). MRSA is predictably resistant to beta-lactam antibiotics (except ceftaroline). Preferred therapy is vancomycin unless clinically contraindicated. Patient requires contact precautions if  hospitalized. CRITICAL RESULT CALLED TO, READ BACK BY AND VERIFIED WITH: MORGAN HICKS, PHARMD AT 0725 ON 09/23/21 BY GM    Staphylococcus epidermidis NOT DETECTED NOT DETECTED Final   Staphylococcus lugdunensis NOT DETECTED NOT DETECTED Final   Streptococcus species NOT DETECTED NOT DETECTED Final   Streptococcus agalactiae NOT DETECTED NOT DETECTED Final   Streptococcus pneumoniae NOT DETECTED NOT DETECTED Final   Streptococcus pyogenes NOT DETECTED NOT DETECTED Final  A.calcoaceticus-baumannii NOT DETECTED NOT DETECTED Final   Bacteroides fragilis NOT DETECTED NOT DETECTED Final   Enterobacterales NOT DETECTED NOT DETECTED Final   Enterobacter cloacae complex NOT DETECTED NOT DETECTED Final   Escherichia coli NOT DETECTED NOT DETECTED Final   Klebsiella aerogenes NOT DETECTED NOT DETECTED Final   Klebsiella oxytoca NOT DETECTED NOT DETECTED Final   Klebsiella pneumoniae NOT DETECTED NOT DETECTED Final   Proteus species NOT DETECTED NOT DETECTED Final   Salmonella species NOT DETECTED NOT DETECTED Final   Serratia marcescens NOT DETECTED NOT DETECTED Final   Haemophilus influenzae NOT DETECTED NOT DETECTED Final   Neisseria meningitidis NOT DETECTED NOT DETECTED Final   Pseudomonas aeruginosa NOT DETECTED NOT DETECTED Final   Stenotrophomonas maltophilia NOT DETECTED NOT DETECTED Final   Candida albicans NOT DETECTED NOT DETECTED Final   Candida auris NOT DETECTED NOT DETECTED Final   Candida glabrata NOT DETECTED NOT DETECTED Final   Candida krusei NOT DETECTED NOT DETECTED Final   Candida parapsilosis NOT DETECTED NOT DETECTED Final   Candida tropicalis NOT DETECTED NOT DETECTED Final   Cryptococcus neoformans/gattii NOT DETECTED NOT DETECTED Final   Meth resistant  mecA/C and MREJ DETECTED (A) NOT DETECTED Final    Comment: CRITICAL RESULT CALLED TO, READ BACK BY AND VERIFIED WITH: Ardeen Garland, PHARMD AT 0725 ON 09/23/21 BY GM Performed at [facility], [place], [place] [address]   Group A Strep by PCR ([place] Only)     Status: None   Collection Time: 09/22/21  1:40 PM   Specimen: Throat; Sterile Swab  Result Value Ref Range Status   Group A Strep by PCR NOT DETECTED NOT DETECTED Final    Comment: Performed at [facility], [address] [place], [place] [address]  Peritoneal fluid culture w Gram Stain     Status: None   Collection Time: 09/22/21  7:29 PM   Specimen: Peritoneal Washings; Peritoneal Fluid  Result Value Ref Range Status   Specimen Description   Final    PERITONEAL Performed at [facility], [address] [place], [place] [address]    Special Requests   Final    NONE Performed at [facility], [address] [place], [place] [address]    Gram Stain   Final    FEW WBC PRESENT, PREDOMINANTLY MONONUCLEAR NO ORGANISMS SEEN    Culture   Final    NO GROWTH 3 DAYS Performed at [facility], [place] [address], [place], [place] [address]    Report Status 09/26/2021 FINAL  Final  Gastrointestinal Panel by PCR , Stool     Status: None   Collection Time: 09/23/21  4:45 AM   Specimen: Stool  Result Value Ref Range Status   Campylobacter species NOT DETECTED NOT DETECTED Final   Plesimonas shigelloides NOT DETECTED NOT DETECTED Final   Salmonella species NOT DETECTED NOT DETECTED Final   Yersinia enterocolitica NOT DETECTED NOT DETECTED Final   Vibrio species NOT DETECTED NOT DETECTED Final   Vibrio cholerae NOT DETECTED NOT DETECTED Final   Enteroaggregative E coli (EAEC) NOT DETECTED NOT DETECTED Final   Enteropathogenic E coli (EPEC) NOT DETECTED NOT DETECTED Final   Enterotoxigenic E coli (ETEC) NOT DETECTED NOT DETECTED Final   Shiga like toxin producing E coli  (STEC) NOT DETECTED NOT DETECTED Final   Shigella/Enteroinvasive E coli (EIEC) NOT DETECTED NOT DETECTED Final   Cryptosporidium NOT DETECTED NOT DETECTED Final   Cyclospora cayetanensis NOT DETECTED  NOT DETECTED Final   Entamoeba histolytica NOT DETECTED NOT DETECTED Final   Giardia lamblia NOT DETECTED NOT DETECTED Final   Adenovirus F40/41 NOT DETECTED NOT DETECTED Final   Astrovirus NOT DETECTED NOT DETECTED Final   Norovirus GI/GII NOT DETECTED NOT DETECTED Final   Rotavirus A NOT DETECTED NOT DETECTED Final   Sapovirus (I, II, IV, and V) NOT DETECTED NOT DETECTED Final    Comment: Performed at [facility], [address] [place], [place] [address]  C Difficile Quick Screen w PCR reflex     Status: None   Collection Time: 09/23/21  4:45 AM   Specimen: Stool  Result Value Ref Range Status   C Diff antigen NEGATIVE NEGATIVE Final   C Diff toxin NEGATIVE NEGATIVE Final   C Diff interpretation No C. difficile detected.  Final    Comment: Performed at [facility], [address] [place], [place] [address]  CULTURE, BLOOD (ROUTINE X 2) w Reflex to ID Panel     Status: None   Collection Time: 09/25/21  6:12 AM   Specimen: BLOOD  Result Value Ref Range Status   Specimen Description BLOOD LEFT ARM  Final   Special Requests   Final    BOTTLES DRAWN AEROBIC AND ANAEROBIC Blood Culture adequate volume   Culture   Final    NO GROWTH 5 DAYS Performed at [facility], [address] [place], [place] [address]    Report Status 09/30/2021 FINAL  Final  CULTURE, BLOOD (ROUTINE X 2) w Reflex to ID Panel     Status: None   Collection Time: 09/25/21  6:12 AM   Specimen: BLOOD  Result Value Ref Range Status   Specimen Description BLOOD RIGHT HAND  Final   Special Requests   Final    BOTTLES DRAWN AEROBIC AND ANAEROBIC Blood Culture adequate volume   Culture   Final    NO GROWTH 5 DAYS Performed at [facility], [address] [place], [place]  [address]    Report Status 09/30/2021 FINAL  Final  Aerobic/Anaerobic Culture w Gram Stain (surgical/deep wound)     Status: None   Collection Time: 09/26/21  2:58 PM   Specimen: Foot; Wound  Result Value Ref Range Status   Specimen Description   Final    FOOT LEFT Performed at [facility], [address], [place], [place] [address]    Special Requests   Final    NONE Performed at [facility], [address] [place], [place] [address]    Gram Stain   Final    NO SQUAMOUS EPITHELIAL CELLS SEEN FEW WBC SEEN FEW GRAM POSITIVE COCCI    Culture   Final    MODERATE METHICILLIN RESISTANT STAPHYLOCOCCUS AUREUS NO ANAEROBES ISOLATED Performed at [facility], [place] [address] [place], [place] [address]    Report Status 10/01/2021 FINAL  Final   Organism ID, Bacteria METHICILLIN RESISTANT STAPHYLOCOCCUS AUREUS  Final      Susceptibility   Methicillin resistant staphylococcus aureus - MIC*    CIPROFLOXACIN 2 INTERMEDIATE Intermediate     ERYTHROMYCIN >=8 RESISTANT Resistant     GENTAMICIN <=0.5 SENSITIVE Sensitive     OXACILLIN >=4 RESISTANT Resistant     TETRACYCLINE <=1 SENSITIVE Sensitive     VANCOMYCIN 1 SENSITIVE Sensitive     TRIMETH/SULFA <=10 SENSITIVE Sensitive     CLINDAMYCIN <=0.25 SENSITIVE Sensitive     RIFAMPIN <=0.5 SENSITIVE Sensitive     Inducible  Clindamycin NEGATIVE Sensitive     * MODERATE METHICILLIN RESISTANT STAPHYLOCOCCUS AUREUS  Surgical PCR screen     Status: Abnormal   Collection Time: 09/27/21  4:43 AM   Specimen: Nasal Mucosa; Nasal Swab  Result Value Ref Range Status   MRSA, PCR POSITIVE (A) NEGATIVE Final    Comment: RESULT CALLED TO, READ BACK BY AND VERIFIED WITH: CHARLOTTE KYEI AT 9678 ON 09/27/21 [place].    Staphylococcus aureus POSITIVE (A) NEGATIVE Final    Comment: (NOTE) The Xpert SA Assay (FDA approved for NASAL specimens in patients 75 years of age and older), is one component of a comprehensive surveillance  program. It is not intended to diagnose infection nor to guide or monitor treatment. Performed at [facility], [address] [place], [place] [address]   Aerobic/Anaerobic Culture w Gram Stain (surgical/deep wound)     Status: None (Preliminary result)   Collection Time: 09/27/21  9:17 AM   Specimen: PATH Digit amputation; Tissue  Result Value Ref Range Status   Specimen Description   Final    WOUND Performed at [facility], [address], [place], [place] [address]    Special Requests   Final    DIGIT Performed at [facility], [place], [place] [address]    Gram Stain   Final    NO SQUAMOUS EPITHELIAL CELLS SEEN FEW WBC SEEN FEW GRAM POSITIVE COCCI Performed at [facility], [place] [address] [place], [place] [address]    Culture   Final    FEW METHICILLIN RESISTANT STAPHYLOCOCCUS AUREUS NO ANAEROBES ISOLATED; CULTURE IN PROGRESS FOR 5 DAYS    Report Status PENDING  Incomplete   Organism ID, Bacteria METHICILLIN RESISTANT STAPHYLOCOCCUS AUREUS  Final      Susceptibility   Methicillin resistant staphylococcus aureus - MIC*    CIPROFLOXACIN 2 INTERMEDIATE Intermediate     ERYTHROMYCIN >=8 RESISTANT Resistant     GENTAMICIN <=0.5 SENSITIVE Sensitive     OXACILLIN >=4 RESISTANT Resistant     TETRACYCLINE <=1 SENSITIVE Sensitive     VANCOMYCIN <=0.5 SENSITIVE Sensitive     TRIMETH/SULFA <=10 SENSITIVE Sensitive     CLINDAMYCIN <=0.25 SENSITIVE Sensitive     RIFAMPIN <=0.5 SENSITIVE Sensitive     Inducible Clindamycin NEGATIVE Sensitive     * FEW METHICILLIN RESISTANT STAPHYLOCOCCUS AUREUS    Coagulation Studies: No results for input(s): LABPROT, INR in the last 72 hours.   Urinalysis: No results for input(s): COLORURINE, LABSPEC, PHURINE, GLUCOSEU, HGBUR, BILIRUBINUR, KETONESUR, PROTEINUR, UROBILINOGEN, NITRITE, LEUKOCYTESUR in the last 72 hours.  Invalid input(s): APPERANCEUR    Imaging: No results  found.   Medications:    sodium chloride     DAPTOmycin (CUBICIN)  IV 800 mg (09/30/21 1433)   dialysis solution 1.5% low-MG/low-CA     dialysis solution 2.5% low-MG/low-CA      sodium chloride   Intravenous Once   abacavir  300 mg Oral BID   amLODipine  10 mg Oral Daily   carvedilol  25 mg Oral BID   Chlorhexidine Gluconate Cloth  6 each Topical Q0600   dolutegravir  50 mg Oral QHS   gentamicin cream  1 application Topical Daily   heparin  5,000 Units Subcutaneous Q8H   insulin aspart  0-6 Units Subcutaneous TID WC   [employer] Standard 1.0  325 mL Oral BID   lamiVUDine  25 mg Oral QHS   losartan  100 mg Oral QPM   minoxidil  2.5 mg Oral BID   multivitamin  1 tablet Oral Daily   mupirocin ointment  1 application Nasal BID   potassium chloride  20 mEq Oral Daily   sodium chloride flush  3 mL Intravenous Q12H   sodium chloride, acetaminophen, hydrALAZINE, labetalol, loperamide, oxyCODONE, sodium chloride flush  Assessment/ Plan:  [name] is a 50 y.o. black male with end stage renal disease on peritoneal dialysis, HIV on HART, hypertension, diabetes mellitus type II, diabetic neuropathy who is admitted to [facility] on 09/22/2021 on Leukocytosis [D72.829] Uremia [N19] Generalized abdominal pain [R10.84] ESRD on peritoneal dialysis ([place]) [N18.6, Z99.2] Acute sepsis ([place]) [A41.9]  [facility] 102.5kg CCPD 9 hours 5 exchanges 2.7 liter fills   End stage renal disease: no indication that patient has peritonitis.  - Continue peritoneal dialysis.     Hypertension:  -Managed with amlodipine, carvedilol, Cozaar and minoxidil  Anemia with chronic kidney disease:  Lab Results  Component Value Date   HGB 8.6 (L) 10/01/2021    Scheduled EPO dose as inpatient 09/28/21 continue EPO 20,000 units weekly subQ.  Diabetes mellitus type II with chronic kidney disease: continue glucose control. Hemoglobin A1c of 6.2%  Hypokalemia -   Lab Results   Component Value Date   K 5.0 10/01/2021  Discontinue potassium supplementation  Left foot osteomyelitis with Sepsis with bacteremia and fever: MRSA in blood cultures on 09/22/21.   - Status post amputation on 11/20.  - IV daptomycin x 6 weeks. Recommended dose 800 mg iv every 48 hrs.End date 11/01/2021 Lt PICC placed on 09/29/21 for antibiotics - appreciate podiatry, ID and cardiology input.   -Patient had I&D on November 23   LOS: 7 Haunani Dickard 11/24/202211:42 AM

## 2021-10-01 NOTE — Progress Notes (Signed)
Progress Note    Nathan Ayers   [id number]  DOB: 11-Mar-1971  DOA: 09/22/2021     7 PCP: Judeth Cornfield, MD  Initial CC: [facility] Course: Mr. Strole is a 50  yo male with PMH ESRD on PD, HIV, DM1, OSA, HTN who presented with generalized malaise.  He was admitted for further work-up regarding possible sources of infection. Ultimately he developed purulent drainage from his left foot and further work-up of this showed acute osteomyelitis with underlying first MTP joint effusion concerning for septic arthritis as well as an underlying soft tissue abscess. He was evaluated by podiatry and underwent left partial first ray amputation on 09/27/2021. Ongoing work-up also revealed MRSA bacteremia during hospitalization.  He underwent evaluation by ID and also underwent endocarditis work-up with TEE.  He was recommended to continue on antibiotic coverage for 4 weeks.  He was treated with vancomycin initially which was transitioned to daptomycin for ease of administration prior to discharge. Due to ongoing purulent drainage from his wound, he required repeat I&D with podiatry on 09/30/2021.  Interval History:  No events overnight.  Tolerated I&D well yesterday.  No concerns this morning.  Hopeful for discharging home soon.  Assessment & Plan: * Bacteremia due to Staphylococcus aureus - 11/15 blood culture positive for MRSA; repeat blood culture from 09/25/21 is negative - followed by ID - s/p TTE on 11/15 and TEE on 11/17; no signs of endocarditis or veg - s/p vanc, now on daptomycin for 4 week course; underwent tunneled left IJ placement with IR on 11/22 for outpatient abx  Osteomyelitis ([place]) - see bacteremia as well - s/p left partial first ray amputation on 09/27/2021. Due to ongoing purulent drainage he underwent repeat I&D on 11/23 as well - he is recommended for NWB to left foot per podiatry; outpt follow up in 2 weeks - dressing changes recommended 2-3 times per  week as well; follow up [place] orders prior to discharge if able to get RN for assistance as he has no help for this at home  - continue daptomycin   ESRD on dialysis [facility]) - on PD outpatient - no peritonitis on workup on admission as well  - nephrology following for ongoing PD while inpatient, appreciate assistance   OSA (obstructive sleep apnea) - not on CPAP at home   HIV disease ([place]) - CD4 302 - followed at [place] per notes - continue ART  Essential hypertension - Initially low/normal blood pressure and home meds held which have been slowly resumed  Type 1 diabetes ([place]) - continue SSI and CBG monitoring     Old records reviewed in assessment of this patient  Antimicrobials:  Abacavir 09/22/2021 >> current  Dolutegravir 09/22/2021 >> current Lamivudine 09/23/2021 >> current   Cefepime 09/22/2021 x 1 Flagyl 09/22/2021 x 1 Vancomycin 09/22/2021 >> 09/23/21 Dapto 09/28/21 >> current   DVT prophylaxis: HSQ  Code Status:   Code Status: Full Code  Disposition Plan:   Status is: Inpatient  Objective: Blood pressure (!) 175/77, pulse 68, temperature 97.7 F (36.5 C), resp. rate 18, height 6' 6"  (1.981 m), weight 102.8 kg, SpO2 99 %.  Examination:  Physical Exam Constitutional:      General: He is not in acute distress.    Appearance: Normal appearance.  HENT:     Head: Normocephalic and atraumatic.     Mouth/Throat:     Mouth: Mucous membranes are moist.  Eyes:     Extraocular Movements: Extraocular movements intact.  Neck:     Comments: Left tunnled IJ noted Cardiovascular:     Rate and Rhythm: Normal rate and regular rhythm.     Heart sounds: Normal heart sounds.  Pulmonary:     Effort: Pulmonary effort is normal. No respiratory distress.     Breath sounds: Normal breath sounds. No wheezing.  Abdominal:     General: Bowel sounds are normal. There is no distension.     Palpations: Abdomen is soft.     Tenderness: There is no abdominal tenderness.      Comments: PD cath in place  Musculoskeletal:     Cervical back: Normal range of motion and neck supple.     Comments: Left foot dressed with bandage   Skin:    General: Skin is warm and dry.  Neurological:     General: No focal deficit present.     Mental Status: He is alert.  Psychiatric:        Mood and Affect: Mood normal.        Behavior: Behavior normal.     Consultants:  Podiatry ID  Procedures:  Left partial first ray amputation on 09/27/2021  Data Reviewed: I have personally reviewed labs and imaging studies     LOS: 7 days  Time spent: Greater than 50% of the 35 minute visit was spent in counseling/coordination of care for the patient as laid out in the A&P.   Dwyane Dee, MD [facility] 10/01/2021, 12:40 PM

## 2021-10-02 DIAGNOSIS — D509 Iron deficiency anemia, unspecified: Secondary | ICD-10-CM | POA: Diagnosis not present

## 2021-10-02 DIAGNOSIS — D631 Anemia in chronic kidney disease: Secondary | ICD-10-CM | POA: Diagnosis not present

## 2021-10-02 DIAGNOSIS — R17 Unspecified jaundice: Secondary | ICD-10-CM | POA: Diagnosis not present

## 2021-10-02 DIAGNOSIS — E440 Moderate protein-calorie malnutrition: Secondary | ICD-10-CM | POA: Diagnosis not present

## 2021-10-02 DIAGNOSIS — Z79899 Other long term (current) drug therapy: Secondary | ICD-10-CM | POA: Diagnosis not present

## 2021-10-02 DIAGNOSIS — N186 End stage renal disease: Secondary | ICD-10-CM | POA: Diagnosis not present

## 2021-10-02 DIAGNOSIS — N2581 Secondary hyperparathyroidism of renal origin: Secondary | ICD-10-CM | POA: Diagnosis not present

## 2021-10-02 DIAGNOSIS — N2589 Other disorders resulting from impaired renal tubular function: Secondary | ICD-10-CM | POA: Diagnosis not present

## 2021-10-02 DIAGNOSIS — Z992 Dependence on renal dialysis: Secondary | ICD-10-CM | POA: Diagnosis not present

## 2021-10-02 LAB — CBC WITH DIFFERENTIAL/PLATELET
Abs Immature Granulocytes: 0.19 10*3/uL — ABNORMAL HIGH (ref 0.00–0.07)
Basophils Absolute: 0.1 10*3/uL (ref 0.0–0.1)
Basophils Relative: 1 %
Eosinophils Absolute: 0.2 10*3/uL (ref 0.0–0.5)
Eosinophils Relative: 2 %
HCT: 24.4 % — ABNORMAL LOW (ref 39.0–52.0)
Hemoglobin: 8.3 g/dL — ABNORMAL LOW (ref 13.0–17.0)
Immature Granulocytes: 2 %
Lymphocytes Relative: 23 %
Lymphs Abs: 1.9 10*3/uL (ref 0.7–4.0)
MCH: 28.5 pg (ref 26.0–34.0)
MCHC: 34 g/dL (ref 30.0–36.0)
MCV: 83.8 fL (ref 80.0–100.0)
Monocytes Absolute: 0.8 10*3/uL (ref 0.1–1.0)
Monocytes Relative: 10 %
Neutro Abs: 5.2 10*3/uL (ref 1.7–7.7)
Neutrophils Relative %: 62 %
Platelets: 279 10*3/uL (ref 150–400)
RBC: 2.91 MIL/uL — ABNORMAL LOW (ref 4.22–5.81)
RDW: 14.1 % (ref 11.5–15.5)
WBC: 8.3 10*3/uL (ref 4.0–10.5)
nRBC: 0 % (ref 0.0–0.2)

## 2021-10-02 LAB — GLUCOSE, CAPILLARY
Glucose-Capillary: 122 mg/dL — ABNORMAL HIGH (ref 70–99)
Glucose-Capillary: 164 mg/dL — ABNORMAL HIGH (ref 70–99)
Glucose-Capillary: 132 mg/dL — ABNORMAL HIGH (ref 70–99)
Glucose-Capillary: 133 mg/dL — ABNORMAL HIGH (ref 70–99)

## 2021-10-02 LAB — BASIC METABOLIC PANEL
Anion gap: 12 (ref 5–15)
BUN: 73 mg/dL — ABNORMAL HIGH (ref 6–20)
CO2: 24 mmol/L (ref 22–32)
Calcium: 8.4 mg/dL — ABNORMAL LOW (ref 8.9–10.3)
Chloride: 96 mmol/L — ABNORMAL LOW (ref 98–111)
Creatinine, Ser: 16.06 mg/dL — ABNORMAL HIGH (ref 0.61–1.24)
GFR, Estimated: 3 mL/min — ABNORMAL LOW (ref >60)
Glucose, Bld: 169 mg/dL — ABNORMAL HIGH (ref 70–99)
Potassium: 4.3 mmol/L (ref 3.5–5.1)
Sodium: 132 mmol/L — ABNORMAL LOW (ref 135–145)

## 2021-10-02 LAB — MAGNESIUM: Magnesium: 2.3 mg/dL (ref 1.7–2.4)

## 2021-10-02 LAB — AEROBIC/ANAEROBIC CULTURE W GRAM STAIN (SURGICAL/DEEP WOUND): Gram Stain: NONE SEEN

## 2021-10-02 MED ORDER — HEPARIN 1000 UNIT/ML FOR PERITONEAL DIALYSIS
500.0000 [IU] | INTRAMUSCULAR | Status: DC | PRN
  Filled 2021-10-02 (×4): qty 0.5

## 2021-10-02 NOTE — TOC Progression Note (Signed)
Transition of Care [facility]) - Progression Note    Patient Details  Name: XZAVIOR REINIG MRN: [id number] Date of Birth: Nov 17, 1970  Transition of Care [facility]) CM/SW [place], RN Phone Number: 10/02/2021, 5:33 PM  Clinical Narrative:  Met with patient and mother at bedside. Pam was able to complete IV Abx admin. Education with patient and mother. Unable to discharge due to not securing [facility] Wound care, was advised to call Janeece Riggers [phone] tomorrow, office closed today. Patient will be staying in [place], [address] [phone]. Will pass information to [facility] weekend team to follow up.         Expected Discharge Plan and Services                                                 Social Determinants of Health (SDOH) Interventions    Readmission Risk Interventions No flowsheet data found.

## 2021-10-02 NOTE — Progress Notes (Addendum)
Patient disconnected from peritoneal cycler without complications.Net uf=[id number]. Effluent was mostly clear, but at least 2 pink tinged fibrin noted in bag. Per patient treatment was tolerated well. PD Cathether capped and clamped.Shantelle Breeze,NP made aware of pink tinged fibrin in effluent and gave orders to add Heparin to dialysate bags for treatment tonight by pharmacy.

## 2021-10-02 NOTE — Progress Notes (Signed)
Progress Note    Nathan Ayers   [id number]  DOB: 1971-04-07  DOA: 09/22/2021     8 PCP: Judeth Cornfield, MD  Initial CC: [facility] Course: Nathan Ayers is a 50  yo male with PMH ESRD on PD, HIV, DM1, OSA, HTN who presented with generalized malaise.  He was admitted for further work-up regarding possible sources of infection. Ultimately he developed purulent drainage from his left foot and further work-up of this showed acute osteomyelitis with underlying first MTP joint effusion concerning for septic arthritis as well as an underlying soft tissue abscess. He was evaluated by podiatry and underwent left partial first ray amputation on 09/27/2021. Ongoing work-up also revealed MRSA bacteremia during hospitalization.  He underwent evaluation by ID and also underwent endocarditis work-up with TEE.  He was recommended to continue on antibiotic coverage for 4 weeks.  He was treated with vancomycin initially which was transitioned to daptomycin for ease of administration prior to discharge. Due to ongoing purulent drainage from his wound, he required repeat I&D with podiatry on 09/30/2021.  Interval History:  No events overnight. Had PD overnight, no major issues.  We are still working on finding [facility] coverage for wound care, given that patient cannot perform wound care individually nor has help.   Assessment & Plan: * Bacteremia due to Staphylococcus aureus - 11/15 blood culture positive for MRSA; repeat blood culture from 09/25/21 is negative - followed by ID - s/p TTE on 11/15 and TEE on 11/17; no signs of endocarditis or veg - s/p vanc, now on daptomycin for 4 week course; underwent tunneled left IJ placement with IR on 11/22 for outpatient abx  Osteomyelitis ([place]) - see bacteremia as well - s/p left partial first ray amputation on 09/27/2021. Due to ongoing purulent drainage he underwent repeat I&D on 11/23 as well - he is recommended for NWB to left foot per podiatry;  outpt follow up in 2 weeks - dressing changes recommended 2-3 times per week as well; follow up [place] orders prior to discharge if able to get RN for assistance as he has no help for this at home; discharge still pending adequate HH plan - continue daptomycin   ESRD on dialysis ([place]) - on PD outpatient - no peritonitis on workup on admission as well  - nephrology following for ongoing PD while inpatient, appreciate assistance   OSA (obstructive sleep apnea) - not on CPAP at home   HIV disease ([place]) - CD4 302 - followed at [place] per notes - continue ART  Essential hypertension - Initially low/normal blood pressure and home meds held which have been slowly resumed  Type 1 diabetes ([place]) - continue SSI and CBG monitoring     Old records reviewed in assessment of this patient  Antimicrobials:  Abacavir 09/22/2021 >> current  Dolutegravir 09/22/2021 >> current Lamivudine 09/23/2021 >> current   Cefepime 09/22/2021 x 1 Flagyl 09/22/2021 x 1 Vancomycin 09/22/2021 >> 09/23/21 Dapto 09/28/21 >> current   DVT prophylaxis: HSQ  Code Status:   Code Status: Full Code  Disposition Plan:  Stable but awaiting HH plan Status is: Inpatient  Objective: Blood pressure (!) 152/73, pulse 74, temperature 98.6 F (37 C), temperature source Oral, resp. rate 18, height 6' 6"  (1.981 m), weight 101.5 kg, SpO2 100 %.  Examination:  Physical Exam Constitutional:      General: He is not in acute distress.    Appearance: Normal appearance.  HENT:     Head: Normocephalic and atraumatic.  Mouth/Throat:     Mouth: Mucous membranes are moist.  Eyes:     Extraocular Movements: Extraocular movements intact.  Neck:     Comments: Left tunnled IJ noted Cardiovascular:     Rate and Rhythm: Normal rate and regular rhythm.     Heart sounds: Normal heart sounds.  Pulmonary:     Effort: Pulmonary effort is normal. No respiratory distress.     Breath sounds: Normal breath sounds. No wheezing.   Abdominal:     General: Bowel sounds are normal. There is no distension.     Palpations: Abdomen is soft.     Tenderness: There is no abdominal tenderness.     Comments: PD cath in place  Musculoskeletal:     Cervical back: Normal range of motion and neck supple.     Comments: Left foot dressed with bandage   Skin:    General: Skin is warm and dry.  Neurological:     General: No focal deficit present.     Mental Status: He is alert.  Psychiatric:        Mood and Affect: Mood normal.        Behavior: Behavior normal.     Consultants:  Podiatry ID  Procedures:  Left partial first ray amputation on 09/27/2021  Data Reviewed: I have personally reviewed labs and imaging studies     LOS: 8 days  Time spent: Greater than 50% of the 35 minute visit was spent in counseling/coordination of care for the patient as laid out in the A&P.   Dwyane Dee, MD [facility] 10/02/2021, 4:08 PM

## 2021-10-02 NOTE — Progress Notes (Addendum)
[facility]  ROUNDING NOTE   Subjective:   Patient seen resting in bed States procedure went well yesterday Currently awaiting home health arrangements for discharge. Patient states he was trying to provide himself with home antibiotics.   Objective:  Vital signs in last 24 hours:  Temp:  [98.1 F (36.7 C)-98.6 F (37 C)] 98.6 F (37 C) (11/25 0751) Pulse Rate:  [73-76] 74 (11/25 0751) Resp:  [18] 18 (11/25 0751) BP: (137-152)/(63-73) 152/73 (11/25 0751) SpO2:  [99 %-100 %] 100 % (11/25 0751) Weight:  [101.5 kg] 101.5 kg (11/25 0500)  Weight change: 0.5 kg Filed Weights   09/30/21 1601 10/01/21 0500 10/02/21 0500  Weight: 101 kg 102.8 kg 101.5 kg    Intake/Output: I/O last 3 completed shifts: In: 12500 [Other:12500] Out: 01314 [HOOIL:[id number]]   Intake/Output this shift:  No intake/output data recorded.  Physical Exam: General: NAD, sitting up in bed  Head: Normocephalic, atraumatic. Moist oral mucosal membranes  Lungs:  Clear bilaterally, normal effort  Heart: Regular rate and rhythm  Abdomen:  Soft, nontender, PD catheter  Extremities: No peripheral edema. Right foot dressings clean, dry and intact  Neurologic: Nonfocal, moving all four extremities  Skin: No lesions  Access: PD catheter, right radiocephalic AVF    Basic Metabolic Panel: Recent Labs  Lab 09/27/21 0459 09/28/21 0411 09/29/21 0815 10/01/21 0843 10/02/21 0416  NA 133* 131* 133* 131* 132*  K 3.6 3.9 4.0 5.0 4.3  CL 94* 94* 95* 95* 96*  CO2 23 26 24 23 24   GLUCOSE 172* 120* 103* 106* 169*  BUN 88* 75* 68* 74* 73*  CREATININE 18.37* 17.06* 16.98* 18.40* 16.06*  CALCIUM 8.8* 8.4* 8.5* 8.5* 8.4*  MG  --   --   --  2.4 2.3     Liver Function Tests: No results for input(s): AST, ALT, ALKPHOS, BILITOT, PROT, ALBUMIN in the last 168 hours.  No results for input(s): LIPASE, AMYLASE in the last 168 hours. No results for input(s): AMMONIA in the last 168 hours.  CBC: Recent  Labs  Lab 09/28/21 0411 09/29/21 0815 09/30/21 0520 10/01/21 0843 10/02/21 0416  WBC 11.1* 10.2 9.0 9.2 8.3  NEUTROABS  --   --   --  5.8 5.2  HGB 6.9* 7.4* 7.0* 8.6* 8.3*  HCT 19.9* 21.7* 20.8* 24.7* 24.4*  MCV 84.0 83.5 83.9 84.0 83.8  PLT 303 280 259 290 279     Cardiac Enzymes: Recent Labs  Lab 09/28/21 0411  CKTOTAL 70     BNP: Invalid input(s): POCBNP  CBG: Recent Labs  Lab 10/01/21 1136 10/01/21 1632 10/01/21 2105 10/02/21 0748 10/02/21 1144  GLUCAP 114* 103* 174* 122* 164*     Microbiology: Results for orders placed or performed during the hospital encounter of 09/22/21  Resp Panel by RT-PCR (Flu A&B, Covid) Nasopharyngeal Swab     Status: None   Collection Time: 09/22/21 11:30 AM   Specimen: Nasopharyngeal Swab; Nasopharyngeal(NP) swabs in vial transport medium  Result Value Ref Range Status   SARS Coronavirus 2 by RT PCR NEGATIVE NEGATIVE Final    Comment: (NOTE) SARS-CoV-2 target nucleic acids are NOT DETECTED.  The SARS-CoV-2 RNA is generally detectable in upper respiratory specimens during the acute phase of infection. The lowest concentration of SARS-CoV-2 viral copies this assay can detect is 138 copies/mL. A negative result does not preclude SARS-Cov-2 infection and should not be used as the sole basis for treatment or other patient management decisions. A negative result may occur with  improper specimen collection/handling, submission of specimen other than nasopharyngeal swab, presence of viral mutation(s) within the areas targeted by this assay, and inadequate number of viral copies(<138 copies/mL). A negative result must be combined with clinical observations, patient history, and epidemiological information. The expected result is Negative.  Fact Sheet for Patients:  EntrepreneurPulse.com.au  Fact Sheet for Healthcare Providers:  IncredibleEmployment.be  This test is no t yet approved or  cleared by the [place] FDA and  has been authorized for detection and/or diagnosis of SARS-CoV-2 by FDA under an Emergency Use Authorization (EUA). This EUA will remain  in effect (meaning this test can be used) for the duration of the COVID-19 declaration under Section 564(b)(1) of the Act, 21 U.S.C.section 360bbb-3(b)(1), unless the authorization is terminated  or revoked sooner.       Influenza A by PCR NEGATIVE NEGATIVE Final   Influenza B by PCR NEGATIVE NEGATIVE Final    Comment: (NOTE) The Xpert Xpress SARS-CoV-2/FLU/RSV plus assay is intended as an aid in the diagnosis of influenza from Nasopharyngeal swab specimens and should not be used as a sole basis for treatment. Nasal washings and aspirates are unacceptable for Xpert Xpress SARS-CoV-2/FLU/RSV testing.  Fact Sheet for Patients: EntrepreneurPulse.com.au  Fact Sheet for Healthcare Providers: IncredibleEmployment.be  This test is not yet approved or cleared by the [place] FDA and has been authorized for detection and/or diagnosis of SARS-CoV-2 by FDA under an Emergency Use Authorization (EUA). This EUA will remain in effect (meaning this test can be used) for the duration of the COVID-19 declaration under Section 564(b)(1) of the Act, 21 U.S.C. section 360bbb-3(b)(1), unless the authorization is terminated or revoked.  Performed at [facility], [place], [place] [address]   Respiratory (~20 pathogens) panel by PCR     Status: None   Collection Time: 09/22/21 11:30 AM   Specimen: Peritoneal Washings; Respiratory  Result Value Ref Range Status   Adenovirus NOT DETECTED NOT DETECTED Final   Coronavirus 229E NOT DETECTED NOT DETECTED Final    Comment: (NOTE) The Coronavirus on the Respiratory Panel, DOES NOT test for the novel  Coronavirus (2019 nCoV)    Coronavirus HKU1 NOT DETECTED NOT DETECTED Final   Coronavirus NL63 NOT DETECTED NOT  DETECTED Final   Coronavirus OC43 NOT DETECTED NOT DETECTED Final   Metapneumovirus NOT DETECTED NOT DETECTED Final   Rhinovirus / Enterovirus NOT DETECTED NOT DETECTED Final   Influenza A NOT DETECTED NOT DETECTED Final   Influenza B NOT DETECTED NOT DETECTED Final   Parainfluenza Virus 1 NOT DETECTED NOT DETECTED Final   Parainfluenza Virus 2 NOT DETECTED NOT DETECTED Final   Parainfluenza Virus 3 NOT DETECTED NOT DETECTED Final   Parainfluenza Virus 4 NOT DETECTED NOT DETECTED Final   Respiratory Syncytial Virus NOT DETECTED NOT DETECTED Final   Bordetella pertussis NOT DETECTED NOT DETECTED Final   Bordetella Parapertussis NOT DETECTED NOT DETECTED Final   Chlamydophila pneumoniae NOT DETECTED NOT DETECTED Final   Mycoplasma pneumoniae NOT DETECTED NOT DETECTED Final    Comment: Performed at [facility], [place]. [address] [place], [place] [address]  Blood Culture (routine x 2)     Status: Abnormal   Collection Time: 09/22/21  1:32 PM   Specimen: BLOOD  Result Value Ref Range Status   Specimen Description   Final    BLOOD LEFT ANTECUBITAL Performed at [facility], [address], [place], [place] [address]    Special Requests   Final  BOTTLES DRAWN AEROBIC AND ANAEROBIC Blood Culture results may not be optimal due to an excessive volume of blood received in culture bottles Performed at [facility], [address] [place], [place] [address]    Culture  Setup Time   Final    GRAM POSITIVE COCCI ANAEROBIC BOTTLE ONLY CRITICAL VALUE NOTED.  VALUE IS CONSISTENT WITH PREVIOUSLY REPORTED AND CALLED VALUE.    Culture (A)  Final    STAPHYLOCOCCUS AUREUS SUSCEPTIBILITIES PERFORMED ON PREVIOUS CULTURE WITHIN THE LAST 5 DAYS. Performed at [facility], [place] [address] [place], [place] [address]    Report Status 09/27/2021 FINAL  Final  Blood Culture (routine x 2)     Status: Abnormal   Collection Time: 09/22/21  1:32 PM   Specimen: BLOOD  Result  Value Ref Range Status   Specimen Description   Final    BLOOD LEFT ANTECUBITAL Performed at [facility], [address], [place], [place] [address]    Special Requests   Final    BOTTLES DRAWN AEROBIC AND ANAEROBIC Blood Culture adequate volume Performed at [facility], [address] [place], [place] [address]    Culture  Setup Time   Final    GRAM POSITIVE COCCI IN BOTH AEROBIC AND ANAEROBIC BOTTLES Organism ID to follow CRITICAL RESULT CALLED TO, READ BACK BY AND VERIFIED WITH: Ardeen Garland, PHARMD AT 0725 ON 09/23/21 BY GM Performed at [facility], [address] [place], [place] [address]    Culture METHICILLIN RESISTANT STAPHYLOCOCCUS AUREUS (A)  Final   Report Status 09/25/2021 FINAL  Final   Organism ID, Bacteria METHICILLIN RESISTANT STAPHYLOCOCCUS AUREUS  Final      Susceptibility   Methicillin resistant staphylococcus aureus - MIC*    CIPROFLOXACIN 2 INTERMEDIATE Intermediate     ERYTHROMYCIN >=8 RESISTANT Resistant     GENTAMICIN <=0.5 SENSITIVE Sensitive     OXACILLIN >=4 RESISTANT Resistant     TETRACYCLINE <=1 SENSITIVE Sensitive     VANCOMYCIN 1 SENSITIVE Sensitive     TRIMETH/SULFA <=10 SENSITIVE Sensitive     CLINDAMYCIN <=0.25 SENSITIVE Sensitive     RIFAMPIN <=0.5 SENSITIVE Sensitive     Inducible Clindamycin NEGATIVE Sensitive     * METHICILLIN RESISTANT STAPHYLOCOCCUS AUREUS  Blood Culture ID Panel (Reflexed)     Status: Abnormal   Collection Time: 09/22/21  1:32 PM  Result Value Ref Range Status   Enterococcus faecalis NOT DETECTED NOT DETECTED Final   Enterococcus Faecium NOT DETECTED NOT DETECTED Final   Listeria monocytogenes NOT DETECTED NOT DETECTED Final   Staphylococcus species DETECTED (A) NOT DETECTED Final    Comment: CRITICAL RESULT CALLED TO, READ BACK BY AND VERIFIED WITH: [place] HICKS, PHARMD AT 0725 ON 09/23/21 BY GM    Staphylococcus aureus (BCID) DETECTED (A) NOT DETECTED Final    Comment:  Methicillin (oxacillin)-resistant Staphylococcus aureus (MRSA). MRSA is predictably resistant to beta-lactam antibiotics (except ceftaroline). Preferred therapy is vancomycin unless clinically contraindicated. Patient requires contact precautions if  hospitalized. CRITICAL RESULT CALLED TO, READ BACK BY AND VERIFIED WITH: [place] HICKS, PHARMD AT 0725 ON 09/23/21 BY GM    Staphylococcus epidermidis NOT DETECTED NOT DETECTED Final   Staphylococcus lugdunensis NOT DETECTED NOT DETECTED Final   Streptococcus species NOT DETECTED NOT DETECTED Final   Streptococcus agalactiae NOT DETECTED NOT DETECTED Final   Streptococcus pneumoniae NOT DETECTED NOT DETECTED Final   Streptococcus pyogenes NOT DETECTED NOT DETECTED Final   A.calcoaceticus-baumannii NOT DETECTED NOT DETECTED Final  Bacteroides fragilis NOT DETECTED NOT DETECTED Final   Enterobacterales NOT DETECTED NOT DETECTED Final   Enterobacter cloacae complex NOT DETECTED NOT DETECTED Final   Escherichia coli NOT DETECTED NOT DETECTED Final   Klebsiella aerogenes NOT DETECTED NOT DETECTED Final   Klebsiella oxytoca NOT DETECTED NOT DETECTED Final   Klebsiella pneumoniae NOT DETECTED NOT DETECTED Final   Proteus species NOT DETECTED NOT DETECTED Final   Salmonella species NOT DETECTED NOT DETECTED Final   Serratia marcescens NOT DETECTED NOT DETECTED Final   Haemophilus influenzae NOT DETECTED NOT DETECTED Final   Neisseria meningitidis NOT DETECTED NOT DETECTED Final   Pseudomonas aeruginosa NOT DETECTED NOT DETECTED Final   Stenotrophomonas maltophilia NOT DETECTED NOT DETECTED Final   Candida albicans NOT DETECTED NOT DETECTED Final   Candida auris NOT DETECTED NOT DETECTED Final   Candida glabrata NOT DETECTED NOT DETECTED Final   Candida krusei NOT DETECTED NOT DETECTED Final   Candida parapsilosis NOT DETECTED NOT DETECTED Final   Candida tropicalis NOT DETECTED NOT DETECTED Final   Cryptococcus neoformans/gattii NOT DETECTED NOT  DETECTED Final   Meth resistant mecA/C and MREJ DETECTED (A) NOT DETECTED Final    Comment: CRITICAL RESULT CALLED TO, READ BACK BY AND VERIFIED WITH: Ardeen Garland, PHARMD AT 0725 ON 09/23/21 BY GM Performed at [facility], [address] [place], [place] [address]   Group A Strep by PCR [facility] Only)     Status: None   Collection Time: 09/22/21  1:40 PM   Specimen: Throat; Sterile Swab  Result Value Ref Range Status   Group A Strep by PCR NOT DETECTED NOT DETECTED Final    Comment: Performed at [facility], [address] [place], [place] [address]  Peritoneal fluid culture w Gram Stain     Status: None   Collection Time: 09/22/21  7:29 PM   Specimen: Peritoneal Washings; Peritoneal Fluid  Result Value Ref Range Status   Specimen Description   Final    PERITONEAL Performed at [facility], [address] [place], [place] [address]    Special Requests   Final    NONE Performed at [facility], [address] [place], [place] [address]    Gram Stain   Final    FEW WBC PRESENT, PREDOMINANTLY MONONUCLEAR NO ORGANISMS SEEN    Culture   Final    NO GROWTH 3 DAYS Performed at [facility], [place] [address] [place], [place] [address]    Report Status 09/26/2021 FINAL  Final  Gastrointestinal Panel by PCR , Stool     Status: None   Collection Time: 09/23/21  4:45 AM   Specimen: Stool  Result Value Ref Range Status   Campylobacter species NOT DETECTED NOT DETECTED Final   Plesimonas shigelloides NOT DETECTED NOT DETECTED Final   Salmonella species NOT DETECTED NOT DETECTED Final   Yersinia enterocolitica NOT DETECTED NOT DETECTED Final   Vibrio species NOT DETECTED NOT DETECTED Final   Vibrio cholerae NOT DETECTED NOT DETECTED Final   Enteroaggregative E coli (EAEC) NOT DETECTED NOT DETECTED Final   Enteropathogenic E coli (EPEC) NOT DETECTED NOT DETECTED Final   Enterotoxigenic E coli (ETEC) NOT DETECTED NOT DETECTED Final   Shiga  like toxin producing E coli (STEC) NOT DETECTED NOT DETECTED Final   Shigella/Enteroinvasive E coli (EIEC) NOT DETECTED NOT DETECTED Final   Cryptosporidium NOT DETECTED NOT DETECTED Final   Cyclospora cayetanensis NOT DETECTED NOT DETECTED Final   Entamoeba histolytica NOT  DETECTED NOT DETECTED Final   Giardia lamblia NOT DETECTED NOT DETECTED Final   Adenovirus F40/41 NOT DETECTED NOT DETECTED Final   Astrovirus NOT DETECTED NOT DETECTED Final   Norovirus GI/GII NOT DETECTED NOT DETECTED Final   Rotavirus A NOT DETECTED NOT DETECTED Final   Sapovirus (I, II, IV, and V) NOT DETECTED NOT DETECTED Final    Comment: Performed at [facility], [address], [place], [place] [address]  C Difficile Quick Screen w PCR reflex     Status: None   Collection Time: 09/23/21  4:45 AM   Specimen: Stool  Result Value Ref Range Status   C Diff antigen NEGATIVE NEGATIVE Final   C Diff toxin NEGATIVE NEGATIVE Final   C Diff interpretation No C. difficile detected.  Final    Comment: Performed at [facility], [address] [place], [place] [address]  CULTURE, BLOOD (ROUTINE X 2) w Reflex to ID Panel     Status: None   Collection Time: 09/25/21  6:12 AM   Specimen: BLOOD  Result Value Ref Range Status   Specimen Description BLOOD LEFT ARM  Final   Special Requests   Final    BOTTLES DRAWN AEROBIC AND ANAEROBIC Blood Culture adequate volume   Culture   Final    NO GROWTH 5 DAYS Performed at [facility], [address] [place], [place] [address]    Report Status 09/30/2021 FINAL  Final  CULTURE, BLOOD (ROUTINE X 2) w Reflex to ID Panel     Status: None   Collection Time: 09/25/21  6:12 AM   Specimen: BLOOD  Result Value Ref Range Status   Specimen Description BLOOD RIGHT HAND  Final   Special Requests   Final    BOTTLES DRAWN AEROBIC AND ANAEROBIC Blood Culture adequate volume   Culture   Final    NO GROWTH 5 DAYS Performed at [facility], [address] [place], [place] [address]    Report Status 09/30/2021 FINAL  Final  Aerobic/Anaerobic Culture w Gram Stain (surgical/deep wound)     Status: None   Collection Time: 09/26/21  2:58 PM   Specimen: Foot; Wound  Result Value Ref Range Status   Specimen Description   Final    FOOT LEFT Performed at [facility], [address], [place], [place] [address]    Special Requests   Final    NONE Performed at [facility], [address] [place], [place] [address]    Gram Stain   Final    NO SQUAMOUS EPITHELIAL CELLS SEEN FEW WBC SEEN FEW GRAM POSITIVE COCCI    Culture   Final    MODERATE METHICILLIN RESISTANT STAPHYLOCOCCUS AUREUS NO ANAEROBES ISOLATED Performed at [facility], [place] [address], [place], [place] [address]    Report Status 10/01/2021 FINAL  Final   Organism ID, Bacteria METHICILLIN RESISTANT STAPHYLOCOCCUS AUREUS  Final      Susceptibility   Methicillin resistant staphylococcus aureus - MIC*    CIPROFLOXACIN 2 INTERMEDIATE Intermediate     ERYTHROMYCIN >=8 RESISTANT Resistant     GENTAMICIN <=0.5 SENSITIVE Sensitive     OXACILLIN >=4 RESISTANT Resistant     TETRACYCLINE <=1 SENSITIVE Sensitive     VANCOMYCIN 1 SENSITIVE Sensitive     TRIMETH/SULFA <=10 SENSITIVE Sensitive     CLINDAMYCIN <=0.25 SENSITIVE Sensitive     RIFAMPIN <=0.5 SENSITIVE Sensitive     Inducible Clindamycin NEGATIVE Sensitive     *  MODERATE METHICILLIN RESISTANT STAPHYLOCOCCUS AUREUS  Surgical PCR screen     Status: Abnormal   Collection Time: 09/27/21  4:43 AM   Specimen: Nasal Mucosa; Nasal Swab  Result Value Ref Range Status   MRSA, PCR POSITIVE (A) NEGATIVE Final    Comment: RESULT CALLED TO, READ BACK BY AND VERIFIED WITH: CHARLOTTE KYEI AT 3888 ON 09/27/21 [place].    Staphylococcus aureus POSITIVE (A) NEGATIVE Final    Comment: (NOTE) The Xpert SA Assay (FDA approved for NASAL specimens in patients 73 years of age and older), is one component of a  comprehensive surveillance program. It is not intended to diagnose infection nor to guide or monitor treatment. Performed at [facility], [address] [place], [place] [address]   Aerobic/Anaerobic Culture w Gram Stain (surgical/deep wound)     Status: None   Collection Time: 09/27/21  9:17 AM   Specimen: PATH Digit amputation; Tissue  Result Value Ref Range Status   Specimen Description   Final    WOUND Performed at [facility], [address] [place], [place] [address]    Special Requests   Final    DIGIT Performed at [facility], [place], [place] [address]    Gram Stain   Final    NO SQUAMOUS EPITHELIAL CELLS SEEN FEW WBC SEEN FEW GRAM POSITIVE COCCI    Culture   Final    FEW METHICILLIN RESISTANT STAPHYLOCOCCUS AUREUS NO ANAEROBES ISOLATED Performed at [facility], [place] [address] [place], [place] [address]    Report Status 10/02/2021 FINAL  Final   Organism ID, Bacteria METHICILLIN RESISTANT STAPHYLOCOCCUS AUREUS  Final      Susceptibility   Methicillin resistant staphylococcus aureus - MIC*    CIPROFLOXACIN 2 INTERMEDIATE Intermediate     ERYTHROMYCIN >=8 RESISTANT Resistant     GENTAMICIN <=0.5 SENSITIVE Sensitive     OXACILLIN >=4 RESISTANT Resistant     TETRACYCLINE <=1 SENSITIVE Sensitive     VANCOMYCIN <=0.5 SENSITIVE Sensitive     TRIMETH/SULFA <=10 SENSITIVE Sensitive     CLINDAMYCIN <=0.25 SENSITIVE Sensitive     RIFAMPIN <=0.5 SENSITIVE Sensitive     Inducible Clindamycin NEGATIVE Sensitive     * FEW METHICILLIN RESISTANT STAPHYLOCOCCUS AUREUS    Coagulation Studies: No results for input(s): LABPROT, INR in the last 72 hours.   Urinalysis: No results for input(s): COLORURINE, LABSPEC, PHURINE, GLUCOSEU, HGBUR, BILIRUBINUR, KETONESUR, PROTEINUR, UROBILINOGEN, NITRITE, LEUKOCYTESUR in the last 72 hours.  Invalid input(s): APPERANCEUR    Imaging: No results found.   Medications:     sodium chloride     DAPTOmycin (CUBICIN)  IV 800 mg (09/30/21 1433)   dialysis solution 1.5% low-MG/low-CA     dialysis solution 2.5% low-MG/low-CA      sodium chloride   Intravenous Once   abacavir  300 mg Oral BID   amLODipine  10 mg Oral Daily   carvedilol  25 mg Oral BID   dolutegravir  50 mg Oral QHS   gentamicin cream  1 application Topical Daily   heparin  5,000 Units Subcutaneous Q8H   insulin aspart  0-6 Units Subcutaneous TID WC   [employer] Standard 1.0  325 mL Oral BID   lamiVUDine  25 mg Oral QHS   losartan  100 mg Oral QPM   minoxidil  2.5 mg Oral BID   multivitamin  1 tablet Oral Daily   sodium chloride flush  3 mL Intravenous Q12H   sodium  chloride, acetaminophen, hydrALAZINE, labetalol, loperamide, oxyCODONE, sodium chloride flush  Assessment/ Plan:  [name] is a 50 y.o. black male with end stage renal disease on peritoneal dialysis, HIV on HART, hypertension, diabetes mellitus type II, diabetic neuropathy who is admitted to [facility] on 09/22/2021 on Leukocytosis [D72.829] Uremia [N19] Generalized abdominal pain [R10.84] ESRD on peritoneal dialysis ([place]) [N18.6, Z99.2] Acute sepsis ([place]) [A41.9]  [facility] 102.5kg CCPD 9 hours 5 exchanges 2.7 liter fills   End stage renal disease: no indication that patient has peritonitis.  - Continue peritoneal dialysis nightly during this admission.  Currently tolerating treatments well. -Per HD RN, pink-tinged fibrin found in drainage bags this AM.  Will place heparin and dialysate for treatment tonight.    Hypertension:  -Managed with amlodipine, carvedilol, Cozaar and minoxidil  Anemia with chronic kidney disease:  Lab Results  Component Value Date   HGB 8.3 (L) 10/02/2021     Continue EPO 20,000 units weekly subQ.  Diabetes mellitus type II with chronic kidney disease: continue glucose control. Hemoglobin A1c of 6.2%  Hypokalemia -   Lab Results  Component Value Date   K  4.3 10/02/2021   Stable  Left foot osteomyelitis with Sepsis with bacteremia and fever: MRSA in blood cultures on 09/22/21.   - Status post amputation on 11/20 with I&D on 09/30/2021 - IV daptomycin x 6 weeks. Recommended dose 800 mg iv every 48 hrs.End date 11/01/2021 Lt PICC placed on 09/29/21 for antibiotics.  Patient provided appropriate training - appreciate podiatry, ID and cardiology input.      LOS: 8 [place] 11/25/202212:45 PM

## 2021-10-02 NOTE — Progress Notes (Signed)
   Date of Admission:  09/22/2021    ID: Nathan Ayers is a 50 y.o. male Principal Problem:   Bacteremia due to Staphylococcus aureus Active Problems:   Type 1 diabetes ([place])   Essential hypertension   HIV disease ([place])   ESRD on dialysis ([place])   OSA (obstructive sleep apnea)   Osteomyelitis ([place])  Had wash out of the great toe amputation site on 09/30/21  Subjective: Feeling better No fever   Medications:   sodium chloride   Intravenous Once   abacavir  300 mg Oral BID   amLODipine  10 mg Oral Daily   carvedilol  25 mg Oral BID   dolutegravir  50 mg Oral QHS   gentamicin cream  1 application Topical Daily   heparin  5,000 Units Subcutaneous Q8H   insulin aspart  0-6 Units Subcutaneous TID WC   [employer] Standard 1.0  325 mL Oral BID   lamiVUDine  25 mg Oral QHS   losartan  100 mg Oral QPM   minoxidil  2.5 mg Oral BID   multivitamin  1 tablet Oral Daily   sodium chloride flush  3 mL Intravenous Q12H    Objective: Vital signs in last 24 hours: Temp:  [98.1 F (36.7 C)-98.6 F (37 C)] 98.6 F (37 C) (11/25 0751) Pulse Rate:  [73-76] 74 (11/25 0751) Resp:  [18] 18 (11/25 0751) BP: (137-152)/(63-73) 152/73 (11/25 0751) SpO2:  [99 %-100 %] 100 % (11/25 0751) Weight:  [101.5 kg] 101.5 kg (11/25 0500)  PHYSICAL EXAM:  General: Alert, cooperative, no distress, appears stated age.  Lungs: Clear to auscultation bilaterally. No Wheezing or Rhonchi. No rales. Heart: Regular rate and rhythm, no murmur, rub or gallop. Extremities:left foot dressing removed   Skin: No rashes or lesions. Or bruising Lymph: Cervical, supraclavicular normal. Neurologic: Grossly non-focal  Lab Results Recent Labs    10/01/21 0843 10/02/21 0416  WBC 9.2 8.3  HGB 8.6* 8.3*  HCT 24.7* 24.4*  NA 131* 132*  K 5.0 4.3  CL 95* 96*  CO2 23 24  BUN 74* 73*  CREATININE 18.40* 16.06*   Liver Panel No results for input(s): PROT, ALBUMIN, AST, ALT, ALKPHOS, BILITOT, BILIDIR, IBILI in the  last 72 hours. Sedimentation Rate No results for input(s): ESRSEDRATE in the last 72 hours. C-Reactive Protein No results for input(s): CRP in the last 72 hours.  Microbiology:  Studies/Results: No results found.   Assessment/Plan: MRSA bacteremia.  The source was not clear until 11/19 when the left foot had purulent discharge Then an MRI was done and that showed a 5.6 into 4.2 into 4 cm collection at the site of the first metatarsal head involving the first MTP joint.  He was taken for surgery and underwent left great toe amputation. TEE is negative. Leukocytosis has resolved  vancomycin changed to daptomycin for ease of administration as OP-on 11/23 he was taken back for further washout, stable now   [facility].  On abacavir, dolutegravir and Epivir.  CD4 count is more than 300 and undetectable viral load  Hypertension on amlodipine, carvedilol and losartan.  End-stage renal disease on peritoneal dialysis.  Peritoneal fluid no evidence of infection.  Diabetes mellitus on insulin.   Discussed the management with the patient ID will follow him as OP

## 2021-10-02 NOTE — Progress Notes (Signed)
Patient connected to peritoneal dialysis without complications. Access care completed. Patient has heparin added to dialysate. Patient with no complaints.

## 2021-10-02 NOTE — Progress Notes (Addendum)
Physical Therapy Treatment Patient Details Name: Nathan Ayers MRN: [id number] DOB: Jul 18, 1971 Today's Date: 10/02/2021   History of Present Illness Patient is a 50 year old male with medical history significant for HIV, ESRD on peritoneal dialysis, OSA, HTN who has Bacteremia and sepsis secondary to left septic first metatarsal phalangeal joint with associated abscess, cellulitis, and osteomyelitis. s/p left partial first ray amputation and application of antibiotic beeds. Due to ongoing purulent drainage from his wound, he required repeat I&D with podiatry on 09/30/2021. Pt now with continue on transfer orders.    PT Comments    Pt seen for PT with pt agreeable but requesting to change pants first & pt does so sitting EOB with mod I. Pt is able to complete sit<>stand transfers with mod I & ambulate into hallway with RW & supervision with 2 standing rest breaks 2/2 fatigue. PT educated pt on stair negotiation backwards with RW & compensatory pattern but pt declines negotiating stairs on this date 2/2 fatigue. Recommend pt practice stair negotiation during next session prior to ambulating.   Addendum: Updated goals to reflect pt's CLOF. Please see updated goals.     Recommendations for follow up therapy are one component of a multi-disciplinary discharge planning process, led by the attending physician.  Recommendations may be updated based on patient status, additional functional criteria and insurance authorization.  Follow Up Recommendations  Home health PT     Assistance Recommended at Discharge Intermittent Supervision/Assistance  Equipment Recommendations  Rolling walker (2 wheels) (tall RW (pt is 6' 6" ))    Recommendations for Other Services       Precautions / Restrictions Precautions Precautions: Fall Restrictions Weight Bearing Restrictions: Yes LLE Weight Bearing: Non weight bearing Other Position/Activity Restrictions: L foot NWB. Heel contact in surgical shoe for  transfers only     Mobility  Bed Mobility Overal bed mobility: Modified Independent                  Transfers Overall transfer level: Modified independent Equipment used: Rolling walker (2 wheels) Transfers: Sit to/from Stand Sit to Stand: Modified independent (Device/Increase time)                Ambulation/Gait Ambulation/Gait assistance: Supervision Gait Distance (Feet): 100 Feet Assistive device: Rolling walker (2 wheels)   Gait velocity: decreased     General Gait Details: Education to not step too close to RW with RLE to prevent posterior LOB.   Stairs             Wheelchair Mobility    Modified Rankin (Stroke Patients Only)       Balance Overall balance assessment: Needs assistance Sitting-balance support: Feet supported Sitting balance-Leahy Scale: Normal Sitting balance - Comments: Pt changes pants & underwear while sitting EOB with mod I without LOB.   Standing balance support: Bilateral upper extremity supported;During functional activity;Reliant on assistive device for balance Standing balance-Leahy Scale: Fair Standing balance comment: BUE support on RW                            Cognition Arousal/Alertness: Awake/alert Behavior During Therapy: WFL for tasks assessed/performed Overall Cognitive Status: Within Functional Limits for tasks assessed                                          Exercises  General Comments        Pertinent Vitals/Pain Pain Assessment: No/denies pain    Home Living                          Prior Function            PT Goals (current goals can now be found in the care plan section) Acute Rehab PT Goals Patient Stated Goal: to go home and have home health PT Goal Formulation: With patient Time For Goal Achievement: 10/12/21 Potential to Achieve Goals: Good Progress towards PT goals: Progressing toward goals    Frequency    Min  2X/week      PT Plan Current plan remains appropriate    Co-evaluation              AM-PAC PT "6 Clicks" Mobility   Outcome Measure  Help needed turning from your back to your side while in a flat bed without using bedrails?: None Help needed moving from lying on your back to sitting on the side of a flat bed without using bedrails?: None Help needed moving to and from a bed to a chair (including a wheelchair)?: None Help needed standing up from a chair using your arms (e.g., wheelchair or bedside chair)?: None Help needed to walk in hospital room?: A Little Help needed climbing 3-5 steps with a railing? : A Little 6 Click Score: 22    End of Session   Activity Tolerance: Patient tolerated treatment well;Patient limited by fatigue Patient left: with call bell/phone within reach;with family/visitor present (sitting EOB)   PT Visit Diagnosis: Unsteadiness on feet (R26.81);Muscle weakness (generalized) (M62.81)     Time: 9201-0071 PT Time Calculation (min) (ACUTE ONLY): 16 min  Charges:  $Therapeutic Activity: 8-22 mins                     [name], PT, DPT 10/02/21, 3:12 PM    [name] 10/02/2021, 3:07 PM

## 2021-10-02 NOTE — Progress Notes (Signed)
OT Cancellation Note  Patient Details Name: Nathan Ayers MRN: [id number] DOB: Oct 06, 1971   Cancelled Treatment:    Reason Eval/Treat Not Completed: Patient at procedure or test/ unavailable. Chart reviewed. Pt currently participating with PT and unavailable for OT session. Will continue to follow POC as pt available.   Dessie Coma, M.S. OTR/L  10/02/21, 2:56 PM  ascom [phone]

## 2021-10-03 DIAGNOSIS — Z79899 Other long term (current) drug therapy: Secondary | ICD-10-CM | POA: Diagnosis not present

## 2021-10-03 DIAGNOSIS — D631 Anemia in chronic kidney disease: Secondary | ICD-10-CM | POA: Diagnosis not present

## 2021-10-03 DIAGNOSIS — N2589 Other disorders resulting from impaired renal tubular function: Secondary | ICD-10-CM | POA: Diagnosis not present

## 2021-10-03 DIAGNOSIS — D509 Iron deficiency anemia, unspecified: Secondary | ICD-10-CM | POA: Diagnosis not present

## 2021-10-03 DIAGNOSIS — R17 Unspecified jaundice: Secondary | ICD-10-CM | POA: Diagnosis not present

## 2021-10-03 DIAGNOSIS — N2581 Secondary hyperparathyroidism of renal origin: Secondary | ICD-10-CM | POA: Diagnosis not present

## 2021-10-03 DIAGNOSIS — N186 End stage renal disease: Secondary | ICD-10-CM | POA: Diagnosis not present

## 2021-10-03 DIAGNOSIS — Z992 Dependence on renal dialysis: Secondary | ICD-10-CM | POA: Diagnosis not present

## 2021-10-03 DIAGNOSIS — E440 Moderate protein-calorie malnutrition: Secondary | ICD-10-CM | POA: Diagnosis not present

## 2021-10-03 LAB — CBC WITH DIFFERENTIAL/PLATELET
Abs Immature Granulocytes: 0.14 10*3/uL — ABNORMAL HIGH (ref 0.00–0.07)
Basophils Absolute: 0.1 10*3/uL (ref 0.0–0.1)
Basophils Relative: 1 %
Eosinophils Absolute: 0.2 10*3/uL (ref 0.0–0.5)
Eosinophils Relative: 2 %
HCT: 23.2 % — ABNORMAL LOW (ref 39.0–52.0)
Hemoglobin: 7.8 g/dL — ABNORMAL LOW (ref 13.0–17.0)
Immature Granulocytes: 2 %
Lymphocytes Relative: 26 %
Lymphs Abs: 2.1 10*3/uL (ref 0.7–4.0)
MCH: 28.3 pg (ref 26.0–34.0)
MCHC: 33.6 g/dL (ref 30.0–36.0)
MCV: 84.1 fL (ref 80.0–100.0)
Monocytes Absolute: 0.8 10*3/uL (ref 0.1–1.0)
Monocytes Relative: 9 %
Neutro Abs: 5.1 10*3/uL (ref 1.7–7.7)
Neutrophils Relative %: 60 %
Platelets: 291 10*3/uL (ref 150–400)
RBC: 2.76 MIL/uL — ABNORMAL LOW (ref 4.22–5.81)
RDW: 14.4 % (ref 11.5–15.5)
WBC: 8.4 10*3/uL (ref 4.0–10.5)
nRBC: 0 % (ref 0.0–0.2)

## 2021-10-03 LAB — GLUCOSE, CAPILLARY
Glucose-Capillary: 114 mg/dL — ABNORMAL HIGH (ref 70–99)
Glucose-Capillary: 163 mg/dL — ABNORMAL HIGH (ref 70–99)
Glucose-Capillary: 104 mg/dL — ABNORMAL HIGH (ref 70–99)
Glucose-Capillary: 196 mg/dL — ABNORMAL HIGH (ref 70–99)

## 2021-10-03 LAB — BASIC METABOLIC PANEL
Anion gap: 14 (ref 5–15)
BUN: 67 mg/dL — ABNORMAL HIGH (ref 6–20)
CO2: 23 mmol/L (ref 22–32)
Calcium: 8.6 mg/dL — ABNORMAL LOW (ref 8.9–10.3)
Chloride: 96 mmol/L — ABNORMAL LOW (ref 98–111)
Creatinine, Ser: 15.21 mg/dL — ABNORMAL HIGH (ref 0.61–1.24)
GFR, Estimated: 4 mL/min — ABNORMAL LOW (ref >60)
Glucose, Bld: 173 mg/dL — ABNORMAL HIGH (ref 70–99)
Potassium: 4.3 mmol/L (ref 3.5–5.1)
Sodium: 133 mmol/L — ABNORMAL LOW (ref 135–145)

## 2021-10-03 LAB — MAGNESIUM: Magnesium: 2.3 mg/dL (ref 1.7–2.4)

## 2021-10-03 MED ORDER — CHLORHEXIDINE GLUCONATE CLOTH 2 % EX PADS
6.0000 | MEDICATED_PAD | Freq: Every day | CUTANEOUS | Status: DC
  Administered 2021-10-04 – 2021-10-05 (×2): 6 via TOPICAL

## 2021-10-03 NOTE — Progress Notes (Signed)
Occupational Therapy Treatment Patient Details Name: Nathan Ayers MRN: [id number] DOB: [birth year] Today's Date: 10/03/2021   History of present illness Patient is a 50 year old male with medical history significant for HIV, ESRD on peritoneal dialysis, OSA, HTN who has Bacteremia and sepsis secondary to left septic first metatarsal phalangeal joint with associated abscess, cellulitis, and osteomyelitis. s/p left partial first ray amputation and application of antibiotic beeds. Due to ongoing purulent drainage from his wound, he required repeat I&D with podiatry on 09/30/2021. Pt now with continue on transfer orders.   OT comments  Upon entering the room, pt supine in bed with family present in the room. Pt reports feeling well and agreeable to OT intervention. Pt reports having just returned to bathroom independently prior to therapist arrival. Pt demonstrates donning L surgical shoe independently, functional transfers with RW while maintaining NWB, and ambulates and navigates room with RW at mod I level. Pt also discussing home set up and equipment needs. He shows good safety awareness based on plans for managing self care at home with use of AD as needed. Pt plans to do sink bath or use wipes at discharge for bathing needs in order to decrease chance of infection. No further needs at this time and OT to SIGN OFF. Pt is agreeable.    Recommendations for follow up therapy are one component of a multi-disciplinary discharge planning process, led by the attending physician.  Recommendations may be updated based on patient status, additional functional criteria and insurance authorization.    Follow Up Recommendations  No OT follow up    Assistance Recommended at Discharge Intermittent Supervision/Assistance  Equipment Recommendations  BSC/3in1       Precautions / Restrictions Precautions Precautions: Fall Restrictions Weight Bearing Restrictions: Yes LLE Weight Bearing: Non weight  bearing Other Position/Activity Restrictions: L foot NWB. Heel contact in surgical shoe for transfers only       Mobility Bed Mobility Overal bed mobility: Modified Independent                  Transfers Overall transfer level: Modified independent Equipment used: Rolling walker (2 wheels) Transfers: Sit to/from Stand;Bed to chair/wheelchair/BSC Sit to Stand: Modified independent (Device/Increase time) Stand pivot transfers: Modified independent (Device/Increase time) Step pivot transfers: Modified independent (Device/Increase time) Squat pivot transfers: Supervision     General transfer comment: Pt ambulates in room with RW at mod I level while maintaining NWB     Balance Overall balance assessment: Needs assistance Sitting-balance support: Feet supported Sitting balance-Leahy Scale: Normal     Standing balance support: Bilateral upper extremity supported;During functional activity;Reliant on assistive device for balance Standing balance-Leahy Scale: Good Standing balance comment: BUE support on RW                           ADL either performed or assessed with clinical judgement   ADL Overall ADL's : Modified independent                                            Extremity/Trunk Assessment Upper Extremity Assessment Upper Extremity Assessment: Overall WFL for tasks assessed   Lower Extremity Assessment Lower Extremity Assessment: Overall WFL for tasks assessed        Vision Patient Visual Report: No change from baseline  Cognition Arousal/Alertness: Awake/alert Behavior During Therapy: WFL for tasks assessed/performed Overall Cognitive Status: Within Functional Limits for tasks assessed                                 General Comments: Very pleseant & agreeable, good understanding of education.                General Comments Pt dons LLE post op shoe independently.    Pertinent Vitals/  Pain       Pain Assessment: No/denies pain         Frequency  Min 2X/week        Progress Toward Goals  OT Goals(current goals can now be found in the care plan section)  Progress towards OT goals: Goals met/education completed, patient discharged from OT  Acute Rehab OT Goals Patient Stated Goal: to go home OT Goal Formulation: With patient Time For Goal Achievement: 10/12/21 Potential to Achieve Goals: Good  Plan Discharge plan remains appropriate;All goals met and education completed, patient discharged from [facility] OT "6 Clicks" Daily Activity     Outcome Measure   Help from another person eating meals?: None Help from another person taking care of personal grooming?: None Help from another person toileting, which includes using toliet, bedpan, or urinal?: None Help from another person bathing (including washing, rinsing, drying)?: A Little Help from another person to put on and taking off regular upper body clothing?: None Help from another person to put on and taking off regular lower body clothing?: None 6 Click Score: 23    End of Session Equipment Utilized During Treatment: Rolling walker (2 wheels)  OT Visit Diagnosis: Other abnormalities of gait and mobility (R26.89)   Activity Tolerance Patient tolerated treatment well   Patient Left in bed;with call bell/phone within reach;with family/visitor present   Nurse Communication Mobility status        Time: 7615-1834 OT Time Calculation (min): 11 min  Charges: OT General Charges $OT Visit: 1 Visit OT Treatments $Self Care/Home Management : 8-22 mins  Darleen Crocker, MS, OTR/L , CBIS ascom [phone]  10/03/21, 3:14 PM

## 2021-10-03 NOTE — Plan of Care (Signed)

## 2021-10-03 NOTE — Progress Notes (Signed)
Patient disconnected from peritoneal dialysis cycler without complications. Net uf=1570m. Effluent clear/yellow, no fibrin noted. Per patient treatment was tolerated well.

## 2021-10-03 NOTE — Progress Notes (Signed)
Progress Note    Nathan Ayers   YQM:[id number]  DOB: 06/18/1971  DOA: 09/22/2021     9 PCP: Judeth Cornfield, MD  Initial CC: [facility] Course: Nathan Ayers is a 50  yo male with PMH ESRD on PD, HIV, DM1, OSA, HTN who presented with generalized malaise.  He was admitted for further work-up regarding possible sources of infection. Ultimately he developed purulent drainage from his left foot and further work-up of this showed acute osteomyelitis with underlying first MTP joint effusion concerning for septic arthritis as well as an underlying soft tissue abscess. He was evaluated by podiatry and underwent left partial first ray amputation on 09/27/2021. Ongoing work-up also revealed MRSA bacteremia during hospitalization.  He underwent evaluation by ID and also underwent endocarditis work-up with TEE.  He was recommended to continue on antibiotic coverage for 4 weeks.  He was treated with vancomycin initially which was transitioned to daptomycin for ease of administration prior to discharge. Due to ongoing purulent drainage from his wound, he required repeat I&D with podiatry on 09/30/2021.  Interval History:  No events overnight.  Mother present bedside this morning.  Patient had no concerns this morning.  Still awaiting disposition regarding outpatient wound care at time of discharge.  Assessment & Plan: * Bacteremia due to Staphylococcus aureus - 11/15 blood culture positive for MRSA; repeat blood culture from 09/25/21 is negative - followed by ID - s/p TTE on 11/15 and TEE on 11/17; no signs of endocarditis or veg - s/p vanc, now on daptomycin for 4 week course; underwent tunneled left IJ placement with IR on 11/22 for outpatient abx  Osteomyelitis ([place]) - see bacteremia as well - s/p left partial first ray amputation on 09/27/2021. Due to ongoing purulent drainage he underwent repeat I&D on 11/23 as well - he is recommended for NWB to left foot per podiatry; outpt  follow up in 2 weeks - dressing changes recommended 2-3 times per week as well; follow up [place] orders prior to discharge if able to get RN for assistance as he has no help for this at home; discharge still pending adequate HH plan - continue daptomycin   ESRD on dialysis ([place]) - on PD outpatient - no peritonitis on workup on admission as well  - nephrology following for ongoing PD while inpatient, appreciate assistance   OSA (obstructive sleep apnea) - not on CPAP at home   HIV disease ([place]) - CD4 302 - followed at [place] per notes - continue ART  Essential hypertension - Initially low/normal blood pressure and home meds held which have been slowly resumed  Type 1 diabetes ([place]) - continue SSI and CBG monitoring     Old records reviewed in assessment of this patient  Antimicrobials:  Abacavir 09/22/2021 >> current  Dolutegravir 09/22/2021 >> current Lamivudine 09/23/2021 >> current   Cefepime 09/22/2021 x 1 Flagyl 09/22/2021 x 1 Vancomycin 09/22/2021 >> 09/23/21 Dapto 09/28/21 >> current   DVT prophylaxis: HSQ  Code Status:   Code Status: Full Code  Disposition Plan:  Stable but awaiting HH plan Status is: Inpatient  Objective: Blood pressure 132/76, pulse 80, temperature 98.2 F (36.8 C), temperature source Oral, resp. rate 16, height 6' 6"  (1.981 m), weight 101.2 kg, SpO2 100 %.  Examination:  Physical Exam Constitutional:      General: He is not in acute distress.    Appearance: Normal appearance.  HENT:     Head: Normocephalic and atraumatic.     Mouth/Throat:  Mouth: Mucous membranes are moist.  Eyes:     Extraocular Movements: Extraocular movements intact.  Neck:     Comments: Left tunnled IJ noted Cardiovascular:     Rate and Rhythm: Normal rate and regular rhythm.     Heart sounds: Normal heart sounds.  Pulmonary:     Effort: Pulmonary effort is normal. No respiratory distress.     Breath sounds: Normal breath sounds. No wheezing.  Abdominal:      General: Bowel sounds are normal. There is no distension.     Palpations: Abdomen is soft.     Tenderness: There is no abdominal tenderness.     Comments: PD cath in place  Musculoskeletal:     Cervical back: Normal range of motion and neck supple.     Comments: Left foot dressed with bandage   Skin:    General: Skin is warm and dry.  Neurological:     General: No focal deficit present.     Mental Status: He is alert.  Psychiatric:        Mood and Affect: Mood normal.        Behavior: Behavior normal.     Consultants:  Podiatry ID  Procedures:  Left partial first ray amputation on 09/27/2021  Data Reviewed: I have personally reviewed labs and imaging studies     LOS: 9 days  Time spent: Greater than 50% of the 35 minute visit was spent in counseling/coordination of care for the patient as laid out in the A&P.   Nathan Dee, MD [facility] 10/03/2021, 3:19 PM

## 2021-10-03 NOTE — Progress Notes (Signed)
[facility]  ROUNDING NOTE   Subjective:   Patient seen resting in bed, mother at bedside Tolerating meals without stomach upset Received PD last night, tolerated well with no complications Pain and discomfort   Objective:  Vital signs in last 24 hours:  Temp:  [97.7 F (36.5 C)-98.2 F (36.8 C)] 98.2 F (36.8 C) (11/26 0946) Pulse Rate:  [72-82] 80 (11/26 0946) Resp:  [16-18] 16 (11/26 0946) BP: (132-145)/(63-76) 132/76 (11/26 0946) SpO2:  [97 %-100 %] 100 % (11/26 0946) Weight:  [101.2 kg] 101.2 kg (11/26 0500)  Weight change: -0.3 kg Filed Weights   10/01/21 0500 10/02/21 0500 10/03/21 0500  Weight: 102.8 kg 101.5 kg 101.2 kg    Intake/Output: I/O last 3 completed shifts: In: 12500 [Other:12500] Out: 51700 [Other:13918]   Intake/Output this shift:  Total I/O In: 12500 [Other:12500] Out: 17494 [Other:13986]  Physical Exam: General: NAD, sitting up in bed  Head: Normocephalic, atraumatic. Moist oral mucosal membranes  Lungs:  Clear bilaterally, normal effort  Heart: Regular rate and rhythm  Abdomen:  Soft, nontender, PD catheter  Extremities: No peripheral edema. Right foot dressings clean, dry and intact  Neurologic: Nonfocal, moving all four extremities  Skin: No lesions  Access: PD catheter, right radiocephalic AVF    Basic Metabolic Panel: Recent Labs  Lab 09/28/21 0411 09/29/21 0815 10/01/21 0843 10/02/21 0416 10/03/21 0612  NA 131* 133* 131* 132* 133*  K 3.9 4.0 5.0 4.3 4.3  CL 94* 95* 95* 96* 96*  CO2 26 24 23 24 23   GLUCOSE 120* 103* 106* 169* 173*  BUN 75* 68* 74* 73* 67*  CREATININE 17.06* 16.98* 18.40* 16.06* 15.21*  CALCIUM 8.4* 8.5* 8.5* 8.4* 8.6*  MG  --   --  2.4 2.3 2.3     Liver Function Tests: No results for input(s): AST, ALT, ALKPHOS, BILITOT, PROT, ALBUMIN in the last 168 hours.  No results for input(s): LIPASE, AMYLASE in the last 168 hours. No results for input(s): AMMONIA in the last 168  hours.  CBC: Recent Labs  Lab 09/29/21 0815 09/30/21 0520 10/01/21 0843 10/02/21 0416 10/03/21 0612  WBC 10.2 9.0 9.2 8.3 8.4  NEUTROABS  --   --  5.8 5.2 5.1  HGB 7.4* 7.0* 8.6* 8.3* 7.8*  HCT 21.7* 20.8* 24.7* 24.4* 23.2*  MCV 83.5 83.9 84.0 83.8 84.1  PLT 280 259 290 279 291     Cardiac Enzymes: Recent Labs  Lab 09/28/21 0411  CKTOTAL 70     BNP: Invalid input(s): POCBNP  CBG: Recent Labs  Lab 10/02/21 0748 10/02/21 1144 10/02/21 1639 10/02/21 2138 10/03/21 0915  GLUCAP 122* 164* 132* 133* 114*     Microbiology: Results for orders placed or performed during the hospital encounter of 09/22/21  Resp Panel by RT-PCR (Flu A&B, Covid) Nasopharyngeal Swab     Status: None   Collection Time: 09/22/21 11:30 AM   Specimen: Nasopharyngeal Swab; Nasopharyngeal(NP) swabs in vial transport medium  Result Value Ref Range Status   SARS Coronavirus 2 by RT PCR NEGATIVE NEGATIVE Final    Comment: (NOTE) SARS-CoV-2 target nucleic acids are NOT DETECTED.  The SARS-CoV-2 RNA is generally detectable in upper respiratory specimens during the acute phase of infection. The lowest concentration of SARS-CoV-2 viral copies this assay can detect is 138 copies/mL. A negative result does not preclude SARS-Cov-2 infection and should not be used as the sole basis for treatment or other patient management decisions. A negative result may occur with  improper specimen  collection/handling, submission of specimen other than nasopharyngeal swab, presence of viral mutation(s) within the areas targeted by this assay, and inadequate number of viral copies(<138 copies/mL). A negative result must be combined with clinical observations, patient history, and epidemiological information. The expected result is Negative.  Fact Sheet for Patients:  EntrepreneurPulse.com.au  Fact Sheet for Healthcare Providers:  IncredibleEmployment.be  This test is no t  yet approved or cleared by the [place] FDA and  has been authorized for detection and/or diagnosis of SARS-CoV-2 by FDA under an Emergency Use Authorization (EUA). This EUA will remain  in effect (meaning this test can be used) for the duration of the COVID-19 declaration under Section 564(b)(1) of the Act, 21 U.S.C.section 360bbb-3(b)(1), unless the authorization is terminated  or revoked sooner.       Influenza A by PCR NEGATIVE NEGATIVE Final   Influenza B by PCR NEGATIVE NEGATIVE Final    Comment: (NOTE) The Xpert Xpress SARS-CoV-2/FLU/RSV plus assay is intended as an aid in the diagnosis of influenza from Nasopharyngeal swab specimens and should not be used as a sole basis for treatment. Nasal washings and aspirates are unacceptable for Xpert Xpress SARS-CoV-2/FLU/RSV testing.  Fact Sheet for Patients: EntrepreneurPulse.com.au  Fact Sheet for Healthcare Providers: IncredibleEmployment.be  This test is not yet approved or cleared by the [place] FDA and has been authorized for detection and/or diagnosis of SARS-CoV-2 by FDA under an Emergency Use Authorization (EUA). This EUA will remain in effect (meaning this test can be used) for the duration of the COVID-19 declaration under Section 564(b)(1) of the Act, 21 U.S.C. section 360bbb-3(b)(1), unless the authorization is terminated or revoked.  Performed at [facility], [place], [place] [address]   Respiratory (~20 pathogens) panel by PCR     Status: None   Collection Time: 09/22/21 11:30 AM   Specimen: Peritoneal Washings; Respiratory  Result Value Ref Range Status   Adenovirus NOT DETECTED NOT DETECTED Final   Coronavirus 229E NOT DETECTED NOT DETECTED Final    Comment: (NOTE) The Coronavirus on the Respiratory Panel, DOES NOT test for the novel  Coronavirus (2019 nCoV)    Coronavirus HKU1 NOT DETECTED NOT DETECTED Final   Coronavirus NL63 NOT  DETECTED NOT DETECTED Final   Coronavirus OC43 NOT DETECTED NOT DETECTED Final   Metapneumovirus NOT DETECTED NOT DETECTED Final   Rhinovirus / Enterovirus NOT DETECTED NOT DETECTED Final   Influenza A NOT DETECTED NOT DETECTED Final   Influenza B NOT DETECTED NOT DETECTED Final   Parainfluenza Virus 1 NOT DETECTED NOT DETECTED Final   Parainfluenza Virus 2 NOT DETECTED NOT DETECTED Final   Parainfluenza Virus 3 NOT DETECTED NOT DETECTED Final   Parainfluenza Virus 4 NOT DETECTED NOT DETECTED Final   Respiratory Syncytial Virus NOT DETECTED NOT DETECTED Final   Bordetella pertussis NOT DETECTED NOT DETECTED Final   Bordetella Parapertussis NOT DETECTED NOT DETECTED Final   Chlamydophila pneumoniae NOT DETECTED NOT DETECTED Final   Mycoplasma pneumoniae NOT DETECTED NOT DETECTED Final    Comment: Performed at [facility], [place]. [address] [place], [place] [address]  Blood Culture (routine x 2)     Status: Abnormal   Collection Time: 09/22/21  1:32 PM   Specimen: BLOOD  Result Value Ref Range Status   Specimen Description   Final    BLOOD LEFT ANTECUBITAL Performed at [facility], [address] [place], [place] [address]    Special Requests   Final  BOTTLES DRAWN AEROBIC AND ANAEROBIC Blood Culture results may not be optimal due to an excessive volume of blood received in culture bottles Performed at [facility], [address] [place], [place] [address]    Culture  Setup Time   Final    GRAM POSITIVE COCCI ANAEROBIC BOTTLE ONLY CRITICAL VALUE NOTED.  VALUE IS CONSISTENT WITH PREVIOUSLY REPORTED AND CALLED VALUE.    Culture (A)  Final    STAPHYLOCOCCUS AUREUS SUSCEPTIBILITIES PERFORMED ON PREVIOUS CULTURE WITHIN THE LAST 5 DAYS. Performed at [facility], [place] [address], [place], [place] [address]    Report Status 09/27/2021 FINAL  Final  Blood Culture (routine x 2)     Status: Abnormal   Collection Time: 09/22/21  1:32 PM   Specimen:  BLOOD  Result Value Ref Range Status   Specimen Description   Final    BLOOD LEFT ANTECUBITAL Performed at [facility], [address] [place], [place] [address]    Special Requests   Final    BOTTLES DRAWN AEROBIC AND ANAEROBIC Blood Culture adequate volume Performed at [facility], [address] [place], [place] [address]    Culture  Setup Time   Final    GRAM POSITIVE COCCI IN BOTH AEROBIC AND ANAEROBIC BOTTLES Organism ID to follow CRITICAL RESULT CALLED TO, READ BACK BY AND VERIFIED WITH: Ardeen Garland, PHARMD AT 0725 ON 09/23/21 BY GM Performed at [facility], [address] [place], [place] [address]    Culture METHICILLIN RESISTANT STAPHYLOCOCCUS AUREUS (A)  Final   Report Status 09/25/2021 FINAL  Final   Organism ID, Bacteria METHICILLIN RESISTANT STAPHYLOCOCCUS AUREUS  Final      Susceptibility   Methicillin resistant staphylococcus aureus - MIC*    CIPROFLOXACIN 2 INTERMEDIATE Intermediate     ERYTHROMYCIN >=8 RESISTANT Resistant     GENTAMICIN <=0.5 SENSITIVE Sensitive     OXACILLIN >=4 RESISTANT Resistant     TETRACYCLINE <=1 SENSITIVE Sensitive     VANCOMYCIN 1 SENSITIVE Sensitive     TRIMETH/SULFA <=10 SENSITIVE Sensitive     CLINDAMYCIN <=0.25 SENSITIVE Sensitive     RIFAMPIN <=0.5 SENSITIVE Sensitive     Inducible Clindamycin NEGATIVE Sensitive     * METHICILLIN RESISTANT STAPHYLOCOCCUS AUREUS  Blood Culture ID Panel (Reflexed)     Status: Abnormal   Collection Time: 09/22/21  1:32 PM  Result Value Ref Range Status   Enterococcus faecalis NOT DETECTED NOT DETECTED Final   Enterococcus Faecium NOT DETECTED NOT DETECTED Final   Listeria monocytogenes NOT DETECTED NOT DETECTED Final   Staphylococcus species DETECTED (A) NOT DETECTED Final    Comment: CRITICAL RESULT CALLED TO, READ BACK BY AND VERIFIED WITH: MORGAN HICKS, PHARMD AT 0725 ON 09/23/21 BY GM    Staphylococcus aureus (BCID) DETECTED (A) NOT DETECTED Final     Comment: Methicillin (oxacillin)-resistant Staphylococcus aureus (MRSA). MRSA is predictably resistant to beta-lactam antibiotics (except ceftaroline). Preferred therapy is vancomycin unless clinically contraindicated. Patient requires contact precautions if  hospitalized. CRITICAL RESULT CALLED TO, READ BACK BY AND VERIFIED WITH: MORGAN HICKS, PHARMD AT 0725 ON 09/23/21 BY GM    Staphylococcus epidermidis NOT DETECTED NOT DETECTED Final   Staphylococcus lugdunensis NOT DETECTED NOT DETECTED Final   Streptococcus species NOT DETECTED NOT DETECTED Final   Streptococcus agalactiae NOT DETECTED NOT DETECTED Final   Streptococcus pneumoniae NOT DETECTED NOT DETECTED Final   Streptococcus pyogenes NOT DETECTED NOT DETECTED Final   A.calcoaceticus-baumannii NOT DETECTED NOT DETECTED Final  Bacteroides fragilis NOT DETECTED NOT DETECTED Final   Enterobacterales NOT DETECTED NOT DETECTED Final   Enterobacter cloacae complex NOT DETECTED NOT DETECTED Final   Escherichia coli NOT DETECTED NOT DETECTED Final   Klebsiella aerogenes NOT DETECTED NOT DETECTED Final   Klebsiella oxytoca NOT DETECTED NOT DETECTED Final   Klebsiella pneumoniae NOT DETECTED NOT DETECTED Final   Proteus species NOT DETECTED NOT DETECTED Final   Salmonella species NOT DETECTED NOT DETECTED Final   Serratia marcescens NOT DETECTED NOT DETECTED Final   Haemophilus influenzae NOT DETECTED NOT DETECTED Final   Neisseria meningitidis NOT DETECTED NOT DETECTED Final   Pseudomonas aeruginosa NOT DETECTED NOT DETECTED Final   Stenotrophomonas maltophilia NOT DETECTED NOT DETECTED Final   Candida albicans NOT DETECTED NOT DETECTED Final   Candida auris NOT DETECTED NOT DETECTED Final   Candida glabrata NOT DETECTED NOT DETECTED Final   Candida krusei NOT DETECTED NOT DETECTED Final   Candida parapsilosis NOT DETECTED NOT DETECTED Final   Candida tropicalis NOT DETECTED NOT DETECTED Final   Cryptococcus neoformans/gattii NOT  DETECTED NOT DETECTED Final   Meth resistant mecA/C and MREJ DETECTED (A) NOT DETECTED Final    Comment: CRITICAL RESULT CALLED TO, READ BACK BY AND VERIFIED WITH: Ardeen [place], PHARMD AT 0725 ON 09/23/21 BY GM Performed at [facility], [address] [place], [place] [address]   Group A Strep by PCR [facility] Only)     Status: None   Collection Time: 09/22/21  1:40 PM   Specimen: Throat; Sterile Swab  Result Value Ref Range Status   Group A Strep by PCR NOT DETECTED NOT DETECTED Final    Comment: Performed at [facility], [address] [place], [place] [address]  Peritoneal fluid culture w Gram Stain     Status: None   Collection Time: 09/22/21  7:29 PM   Specimen: Peritoneal Washings; Peritoneal Fluid  Result Value Ref Range Status   Specimen Description   Final    PERITONEAL Performed at [facility], [address] [place], [place] [address]    Special Requests   Final    NONE Performed at [facility], [address] [place], [place] [address]    Gram Stain   Final    FEW WBC PRESENT, PREDOMINANTLY MONONUCLEAR NO ORGANISMS SEEN    Culture   Final    NO GROWTH 3 DAYS Performed at [facility], [place] [address], [place], [place] [address]    Report Status 09/26/2021 FINAL  Final  Gastrointestinal Panel by PCR , Stool     Status: None   Collection Time: 09/23/21  4:45 AM   Specimen: Stool  Result Value Ref Range Status   Campylobacter species NOT DETECTED NOT DETECTED Final   Plesimonas shigelloides NOT DETECTED NOT DETECTED Final   Salmonella species NOT DETECTED NOT DETECTED Final   Yersinia enterocolitica NOT DETECTED NOT DETECTED Final   Vibrio species NOT DETECTED NOT DETECTED Final   Vibrio cholerae NOT DETECTED NOT DETECTED Final   Enteroaggregative E coli (EAEC) NOT DETECTED NOT DETECTED Final   Enteropathogenic E coli (EPEC) NOT DETECTED NOT DETECTED Final   Enterotoxigenic E coli (ETEC) NOT DETECTED NOT DETECTED  Final   Shiga like toxin producing E coli (STEC) NOT DETECTED NOT DETECTED Final   Shigella/Enteroinvasive E coli (EIEC) NOT DETECTED NOT DETECTED Final   Cryptosporidium NOT DETECTED NOT DETECTED Final   Cyclospora cayetanensis NOT DETECTED NOT DETECTED Final   Entamoeba histolytica NOT  DETECTED NOT DETECTED Final   Giardia lamblia NOT DETECTED NOT DETECTED Final   Adenovirus F40/41 NOT DETECTED NOT DETECTED Final   Astrovirus NOT DETECTED NOT DETECTED Final   Norovirus GI/GII NOT DETECTED NOT DETECTED Final   Rotavirus A NOT DETECTED NOT DETECTED Final   Sapovirus (I, II, IV, and V) NOT DETECTED NOT DETECTED Final    Comment: Performed at [facility], [address], [place], [place] [address]  C Difficile Quick Screen w PCR reflex     Status: None   Collection Time: 09/23/21  4:45 AM   Specimen: Stool  Result Value Ref Range Status   C Diff antigen NEGATIVE NEGATIVE Final   C Diff toxin NEGATIVE NEGATIVE Final   C Diff interpretation No C. difficile detected.  Final    Comment: Performed at [facility], [address] [place], [place] [address]  CULTURE, BLOOD (ROUTINE X 2) w Reflex to ID Panel     Status: None   Collection Time: 09/25/21  6:12 AM   Specimen: BLOOD  Result Value Ref Range Status   Specimen Description BLOOD LEFT ARM  Final   Special Requests   Final    BOTTLES DRAWN AEROBIC AND ANAEROBIC Blood Culture adequate volume   Culture   Final    NO GROWTH 5 DAYS Performed at [facility], [address] [place], [place] [address]    Report Status 09/30/2021 FINAL  Final  CULTURE, BLOOD (ROUTINE X 2) w Reflex to ID Panel     Status: None   Collection Time: 09/25/21  6:12 AM   Specimen: BLOOD  Result Value Ref Range Status   Specimen Description BLOOD RIGHT HAND  Final   Special Requests   Final    BOTTLES DRAWN AEROBIC AND ANAEROBIC Blood Culture adequate volume   Culture   Final    NO GROWTH 5 DAYS Performed at [facility], [address], [place], [place] [address]    Report Status 09/30/2021 FINAL  Final  Aerobic/Anaerobic Culture w Gram Stain (surgical/deep wound)     Status: None   Collection Time: 09/26/21  2:58 PM   Specimen: Foot; Wound  Result Value Ref Range Status   Specimen Description   Final    FOOT LEFT Performed at [facility], [address], [place], [place] [address]    Special Requests   Final    NONE Performed at [facility], [address] [place], [place] [address]    Gram Stain   Final    NO SQUAMOUS EPITHELIAL CELLS SEEN FEW WBC SEEN FEW GRAM POSITIVE COCCI    Culture   Final    MODERATE METHICILLIN RESISTANT STAPHYLOCOCCUS AUREUS NO ANAEROBES ISOLATED Performed at [facility], [place] [address] [place], [place] [address]    Report Status 10/01/2021 FINAL  Final   Organism ID, Bacteria METHICILLIN RESISTANT STAPHYLOCOCCUS AUREUS  Final      Susceptibility   Methicillin resistant staphylococcus aureus - MIC*    CIPROFLOXACIN 2 INTERMEDIATE Intermediate     ERYTHROMYCIN >=8 RESISTANT Resistant     GENTAMICIN <=0.5 SENSITIVE Sensitive     OXACILLIN >=4 RESISTANT Resistant     TETRACYCLINE <=1 SENSITIVE Sensitive     VANCOMYCIN 1 SENSITIVE Sensitive     TRIMETH/SULFA <=10 SENSITIVE Sensitive     CLINDAMYCIN <=0.25 SENSITIVE Sensitive     RIFAMPIN <=0.5 SENSITIVE Sensitive     Inducible Clindamycin NEGATIVE Sensitive     *  MODERATE METHICILLIN RESISTANT STAPHYLOCOCCUS AUREUS  Surgical PCR screen     Status: Abnormal   Collection Time: 09/27/21  4:43 AM   Specimen: Nasal Mucosa; Nasal Swab  Result Value Ref Range Status   MRSA, PCR POSITIVE (A) NEGATIVE Final    Comment: RESULT CALLED TO, READ BACK BY AND VERIFIED WITH: CHARLOTTE KYEI AT 4854 ON 09/27/21 [place].    Staphylococcus aureus POSITIVE (A) NEGATIVE Final    Comment: (NOTE) The Xpert SA Assay (FDA approved for NASAL specimens in patients 33 years of age and older), is one  component of a comprehensive surveillance program. It is not intended to diagnose infection nor to guide or monitor treatment. Performed at [facility], [address] [place], [place] [address]   Aerobic/Anaerobic Culture w Gram Stain (surgical/deep wound)     Status: None   Collection Time: 09/27/21  9:17 AM   Specimen: PATH Digit amputation; Tissue  Result Value Ref Range Status   Specimen Description   Final    WOUND Performed at [facility], [address] [place], [place] [address]    Special Requests   Final    DIGIT Performed at [facility], [place], [place] [address]    Gram Stain   Final    NO SQUAMOUS EPITHELIAL CELLS SEEN FEW WBC SEEN FEW GRAM POSITIVE COCCI    Culture   Final    FEW METHICILLIN RESISTANT STAPHYLOCOCCUS AUREUS NO ANAEROBES ISOLATED Performed at [facility], [place] [address] [place], [place] [address]    Report Status 10/02/2021 FINAL  Final   Organism ID, Bacteria METHICILLIN RESISTANT STAPHYLOCOCCUS AUREUS  Final      Susceptibility   Methicillin resistant staphylococcus aureus - MIC*    CIPROFLOXACIN 2 INTERMEDIATE Intermediate     ERYTHROMYCIN >=8 RESISTANT Resistant     GENTAMICIN <=0.5 SENSITIVE Sensitive     OXACILLIN >=4 RESISTANT Resistant     TETRACYCLINE <=1 SENSITIVE Sensitive     VANCOMYCIN <=0.5 SENSITIVE Sensitive     TRIMETH/SULFA <=10 SENSITIVE Sensitive     CLINDAMYCIN <=0.25 SENSITIVE Sensitive     RIFAMPIN <=0.5 SENSITIVE Sensitive     Inducible Clindamycin NEGATIVE Sensitive     * FEW METHICILLIN RESISTANT STAPHYLOCOCCUS AUREUS    Coagulation Studies: No results for input(s): LABPROT, INR in the last 72 hours.   Urinalysis: No results for input(s): COLORURINE, LABSPEC, PHURINE, GLUCOSEU, HGBUR, BILIRUBINUR, KETONESUR, PROTEINUR, UROBILINOGEN, NITRITE, LEUKOCYTESUR in the last 72 hours.  Invalid input(s): APPERANCEUR    Imaging: No results  found.   Medications:    sodium chloride     DAPTOmycin (CUBICIN)  IV Stopped (10/02/21 1335)   dialysis solution 1.5% low-MG/low-CA     dialysis solution 2.5% low-MG/low-CA      sodium chloride   Intravenous Once   abacavir  300 mg Oral BID   amLODipine  10 mg Oral Daily   carvedilol  25 mg Oral BID   dolutegravir  50 mg Oral QHS   gentamicin cream  1 application Topical Daily   heparin  5,000 Units Subcutaneous Q8H   insulin aspart  0-6 Units Subcutaneous TID WC   [employer] Standard 1.0  325 mL Oral BID   lamiVUDine  25 mg Oral QHS   losartan  100 mg Oral QPM   minoxidil  2.5 mg Oral BID   multivitamin  1 tablet Oral Daily   sodium chloride flush  3 mL Intravenous Q12H   sodium chloride,  acetaminophen, heparin, hydrALAZINE, labetalol, loperamide, oxyCODONE, sodium chloride flush  Assessment/ Plan:  [name] is a 50 y.o. black male with end stage renal disease on peritoneal dialysis, HIV on HART, hypertension, diabetes mellitus type II, diabetic neuropathy who is admitted to [facility] on 09/22/2021 on Leukocytosis [D72.829] Uremia [N19] Generalized abdominal pain [R10.84] ESRD on peritoneal dialysis ([place]) [N18.6, Z99.2] Acute sepsis ([place]) [A41.9]  [facility] 102.5kg CCPD 9 hours 5 exchanges 2.7 liter fills   End stage renal disease: no indication that patient has peritonitis.  - Continue peritoneal dialysis nightly during this admission.  Currently tolerating treatments well. -Heparin added to dialysate for PD treatment overnight.  No fibrin noted in drainage bag this AM..    Hypertension:  -Managed with amlodipine, carvedilol, Cozaar and minoxidil -BP stable for this patient  Anemia with chronic kidney disease:  Lab Results  Component Value Date   HGB 7.8 (L) 10/03/2021    Hemoglobin below target Prescribed EPO 20,000 units weekly subQ.  Diabetes mellitus type II with chronic kidney disease: continue glucose control. Hemoglobin  A1c of 6.2%.  Glucose stable  Hypokalemia -   Lab Results  Component Value Date   K 4.3 10/03/2021   Resolved but we will continue to monitor  Left foot osteomyelitis with Sepsis with bacteremia and fever: MRSA in blood cultures on 09/22/21.   - Status post amputation on 11/20 with I&D on 09/30/2021 - IV daptomycin x 6 weeks. Recommended dose 800 mg iv every 48 hrs.End date 11/01/2021 Lt PICC placed on 09/29/21 for antibiotics.  Patient provided appropriate training - appreciate podiatry, ID and cardiology input.   -Awaiting home care arrangements    LOS: 9 [place] 11/26/202211:29 AM

## 2021-10-03 NOTE — Progress Notes (Signed)
Physical Therapy Treatment Patient Details Name: Nathan Ayers MRN: [id number] DOB: 11-27-1970 Today's Date: 10/03/2021   History of Present Illness Patient is a 50 year old male with medical history significant for HIV, ESRD on peritoneal dialysis, OSA, HTN who has Bacteremia and sepsis secondary to left septic first metatarsal phalangeal joint with associated abscess, cellulitis, and osteomyelitis. s/p left partial first ray amputation and application of antibiotic beeds. Due to ongoing purulent drainage from his wound, he required repeat I&D with podiatry on 09/30/2021. Pt now with continue on transfer orders.    PT Comments    Pt seen for PT tx with mother present for session. Pt completes bed mobility & STS with mod I but requires supervision for squat pivot bed<>recliner. Pt ambulates short distances throughout session with RW & mod I, maintaining NWB LLE. Pt negotiates 2 steps x 2 trials backwards with RW & min assist with pt electing to weight bear slightly through L heel to allow increased balance & safety with task. Pt does voice fatigue at end of session.     Recommendations for follow up therapy are one component of a multi-disciplinary discharge planning process, led by the attending physician.  Recommendations may be updated based on patient status, additional functional criteria and insurance authorization.  Follow Up Recommendations  Home health PT     Assistance Recommended at Discharge Intermittent Supervision/Assistance  Equipment Recommendations  Rolling walker (2 wheels)    Recommendations for Other Services       Precautions / Restrictions Precautions Precautions: Fall Restrictions Weight Bearing Restrictions: Yes LLE Weight Bearing: Non weight bearing Other Position/Activity Restrictions: L foot NWB. Heel contact in surgical shoe for transfers only     Mobility  Bed Mobility Overal bed mobility: Modified Independent                   Transfers Overall transfer level: Modified independent   Transfers: Sit to/from Stand;Bed to chair/wheelchair/BSC Sit to Stand: Modified independent (Device/Increase time)   Squat pivot transfers: Supervision            Ambulation/Gait Ambulation/Gait assistance: Modified independent (Device/Increase time) Gait Distance (Feet): 10 Feet (x 4 times) Assistive device: Rolling walker (2 wheels) Gait Pattern/deviations: Decreased stride length Gait velocity: decreased     General Gait Details: Maintains NWB LLE.   Stairs Stairs: Yes Stairs assistance: Min assist Stair Management: No rails;Backwards;With walker Number of Stairs: 2 (x 2 attempts) General stair comments: PT provides education/cuing re: technique with RW & pt's mother participates in holding RW & education during session. Pt elects to weight bear through L heel (with post op surgical shoe donned) to safely negotiate stairs.   Wheelchair Mobility    Modified Rankin (Stroke Patients Only)       Balance Overall balance assessment: Needs assistance Sitting-balance support: Feet supported Sitting balance-Leahy Scale: Normal     Standing balance support: Bilateral upper extremity supported;During functional activity;Reliant on assistive device for balance Standing balance-Leahy Scale: Good Standing balance comment: BUE support on RW                            Cognition Arousal/Alertness: Awake/alert Behavior During Therapy: WFL for tasks assessed/performed Overall Cognitive Status: Within Functional Limits for tasks assessed                                 General Comments: Very pleseant &  agreeable, good understanding of education.        Exercises      General Comments General comments (skin integrity, edema, etc.): Pt dons LLE post op shoe independently.      Pertinent Vitals/Pain Pain Assessment: No/denies pain    Home Living                           Prior Function            PT Goals (current goals can now be found in the care plan section) Acute Rehab PT Goals Patient Stated Goal: to go home and have home health PT Goal Formulation: With patient Time For Goal Achievement: 10/12/21 Potential to Achieve Goals: Good Progress towards PT goals: Progressing toward goals    Frequency    Min 2X/week      PT Plan Current plan remains appropriate    Co-evaluation              AM-PAC PT "6 Clicks" Mobility   Outcome Measure  Help needed turning from your back to your side while in a flat bed without using bedrails?: None Help needed moving from lying on your back to sitting on the side of a flat bed without using bedrails?: None Help needed moving to and from a bed to a chair (including a wheelchair)?: None Help needed standing up from a chair using your arms (e.g., wheelchair or bedside chair)?: None Help needed to walk in hospital room?: None Help needed climbing 3-5 steps with a railing? : A Little 6 Click Score: 23    End of Session   Activity Tolerance: Patient tolerated treatment well;Patient limited by fatigue Patient left: with nursing/sitter in room;with family/visitor present (sitting on EOB) Nurse Communication: Mobility status PT Visit Diagnosis: Unsteadiness on feet (R26.81);Muscle weakness (generalized) (M62.81)     Time: 0017-4944 PT Time Calculation (min) (ACUTE ONLY): 27 min  Charges:  $Gait Training: 8-22 mins $Therapeutic Activity: 8-22 mins                     [name], PT, DPT 10/03/21, 2:46 PM    [name] 10/03/2021, 2:45 PM

## 2021-10-04 DIAGNOSIS — R17 Unspecified jaundice: Secondary | ICD-10-CM | POA: Diagnosis not present

## 2021-10-04 DIAGNOSIS — D631 Anemia in chronic kidney disease: Secondary | ICD-10-CM | POA: Diagnosis not present

## 2021-10-04 DIAGNOSIS — Z79899 Other long term (current) drug therapy: Secondary | ICD-10-CM | POA: Diagnosis not present

## 2021-10-04 DIAGNOSIS — E440 Moderate protein-calorie malnutrition: Secondary | ICD-10-CM | POA: Diagnosis not present

## 2021-10-04 DIAGNOSIS — N186 End stage renal disease: Secondary | ICD-10-CM | POA: Diagnosis not present

## 2021-10-04 DIAGNOSIS — N2589 Other disorders resulting from impaired renal tubular function: Secondary | ICD-10-CM | POA: Diagnosis not present

## 2021-10-04 DIAGNOSIS — Z992 Dependence on renal dialysis: Secondary | ICD-10-CM | POA: Diagnosis not present

## 2021-10-04 DIAGNOSIS — D509 Iron deficiency anemia, unspecified: Secondary | ICD-10-CM | POA: Diagnosis not present

## 2021-10-04 DIAGNOSIS — R7881 Bacteremia: Secondary | ICD-10-CM | POA: Diagnosis not present

## 2021-10-04 DIAGNOSIS — N2581 Secondary hyperparathyroidism of renal origin: Secondary | ICD-10-CM | POA: Diagnosis not present

## 2021-10-04 LAB — CBC WITH DIFFERENTIAL/PLATELET
Abs Immature Granulocytes: 0.14 10*3/uL — ABNORMAL HIGH (ref 0.00–0.07)
Basophils Absolute: 0.1 10*3/uL (ref 0.0–0.1)
Basophils Relative: 1 %
Eosinophils Absolute: 0.2 10*3/uL (ref 0.0–0.5)
Eosinophils Relative: 2 %
HCT: 23.7 % — ABNORMAL LOW (ref 39.0–52.0)
Hemoglobin: 8.2 g/dL — ABNORMAL LOW (ref 13.0–17.0)
Immature Granulocytes: 2 %
Lymphocytes Relative: 25 %
Lymphs Abs: 2.3 10*3/uL (ref 0.7–4.0)
MCH: 29.4 pg (ref 26.0–34.0)
MCHC: 34.6 g/dL (ref 30.0–36.0)
MCV: 84.9 fL (ref 80.0–100.0)
Monocytes Absolute: 0.7 10*3/uL (ref 0.1–1.0)
Monocytes Relative: 8 %
Neutro Abs: 5.9 10*3/uL (ref 1.7–7.7)
Neutrophils Relative %: 62 %
Platelets: 343 10*3/uL (ref 150–400)
RBC: 2.79 MIL/uL — ABNORMAL LOW (ref 4.22–5.81)
RDW: 14.3 % (ref 11.5–15.5)
WBC: 9.4 10*3/uL (ref 4.0–10.5)
nRBC: 0 % (ref 0.0–0.2)

## 2021-10-04 LAB — BASIC METABOLIC PANEL
Anion gap: 13 (ref 5–15)
BUN: 63 mg/dL — ABNORMAL HIGH (ref 6–20)
CO2: 24 mmol/L (ref 22–32)
Calcium: 8.8 mg/dL — ABNORMAL LOW (ref 8.9–10.3)
Chloride: 95 mmol/L — ABNORMAL LOW (ref 98–111)
Creatinine, Ser: 14.72 mg/dL — ABNORMAL HIGH (ref 0.61–1.24)
GFR, Estimated: 4 mL/min — ABNORMAL LOW (ref >60)
Glucose, Bld: 164 mg/dL — ABNORMAL HIGH (ref 70–99)
Potassium: 4.3 mmol/L (ref 3.5–5.1)
Sodium: 132 mmol/L — ABNORMAL LOW (ref 135–145)

## 2021-10-04 LAB — GLUCOSE, CAPILLARY
Glucose-Capillary: 108 mg/dL — ABNORMAL HIGH (ref 70–99)
Glucose-Capillary: 155 mg/dL — ABNORMAL HIGH (ref 70–99)
Glucose-Capillary: 136 mg/dL — ABNORMAL HIGH (ref 70–99)
Glucose-Capillary: 180 mg/dL — ABNORMAL HIGH (ref 70–99)

## 2021-10-04 LAB — MAGNESIUM: Magnesium: 2.3 mg/dL (ref 1.7–2.4)

## 2021-10-04 NOTE — Progress Notes (Signed)
[facility]  ROUNDING NOTE   Subjective:   Patient sitting up in bed, alert and oriented Tolerating meals and denies shortness of breath No other complaints at this time Peritoneal dialysis went fairly well overnight.  Alarmed during final drain was evaluated and cleared by nursing staff.   Objective:  Vital signs in last 24 hours:  Temp:  [97.8 F (36.6 C)-98.5 F (36.9 C)] 97.8 F (36.6 C) (11/27 1000) Pulse Rate:  [78-87] 84 (11/27 1000) Resp:  [12-18] 18 (11/27 1000) BP: (135-149)/(61-82) 149/70 (11/27 1000) SpO2:  [98 %-100 %] 99 % (11/27 1000)  Weight change:  Filed Weights   10/01/21 0500 10/02/21 0500 10/03/21 0500  Weight: 102.8 kg 101.5 kg 101.2 kg    Intake/Output: I/O last 3 completed shifts: In: 12500 [Other:12500] Out: 00867 [[id number]]   Intake/Output this shift:  No intake/output data recorded.  Physical Exam: General: NAD, sitting up in bed  Head: Normocephalic, atraumatic. Moist oral mucosal membranes  Lungs:  Clear bilaterally, normal effort  Heart: Regular rate and rhythm  Abdomen:  Soft, nontender, PD catheter  Extremities: No peripheral edema. Right foot dressings clean, dry and intact  Neurologic: Nonfocal, moving all four extremities  Skin: No lesions  Access: PD catheter, right radiocephalic AVF    Basic Metabolic Panel: Recent Labs  Lab 09/29/21 0815 10/01/21 0843 10/02/21 0416 10/03/21 0612 10/04/21 0527  NA 133* 131* 132* 133* 132*  K 4.0 5.0 4.3 4.3 4.3  CL 95* 95* 96* 96* 95*  CO2 24 23 24 23 24   GLUCOSE 103* 106* 169* 173* 164*  BUN 68* 74* 73* 67* 63*  CREATININE 16.98* 18.40* 16.06* 15.21* 14.72*  CALCIUM 8.5* 8.5* 8.4* 8.6* 8.8*  MG  --  2.4 2.3 2.3 2.3     Liver Function Tests: No results for input(s): AST, ALT, ALKPHOS, BILITOT, PROT, ALBUMIN in the last 168 hours.  No results for input(s): LIPASE, AMYLASE in the last 168 hours. No results for input(s): AMMONIA in the last 168  hours.  CBC: Recent Labs  Lab 09/30/21 0520 10/01/21 0843 10/02/21 0416 10/03/21 0612 10/04/21 0527  WBC 9.0 9.2 8.3 8.4 9.4  NEUTROABS  --  5.8 5.2 5.1 5.9  HGB 7.0* 8.6* 8.3* 7.8* 8.2*  HCT 20.8* 24.7* 24.4* 23.2* 23.7*  MCV 83.9 84.0 83.8 84.1 84.9  PLT 259 290 279 291 343     Cardiac Enzymes: Recent Labs  Lab 09/28/21 0411  CKTOTAL 70     BNP: Invalid input(s): POCBNP  CBG: Recent Labs  Lab 10/03/21 1220 10/03/21 1701 10/03/21 2036 10/04/21 0854 10/04/21 1153  GLUCAP 163* 104* 196* 108* 155*     Microbiology: Results for orders placed or performed during the hospital encounter of 09/22/21  Resp Panel by RT-PCR (Flu A&B, Covid) Nasopharyngeal Swab     Status: None   Collection Time: 09/22/21 11:30 AM   Specimen: Nasopharyngeal Swab; Nasopharyngeal(NP) swabs in vial transport medium  Result Value Ref Range Status   SARS Coronavirus 2 by RT PCR NEGATIVE NEGATIVE Final    Comment: (NOTE) SARS-CoV-2 target nucleic acids are NOT DETECTED.  The SARS-CoV-2 RNA is generally detectable in upper respiratory specimens during the acute phase of infection. The lowest concentration of SARS-CoV-2 viral copies this assay can detect is 138 copies/mL. A negative result does not preclude SARS-Cov-2 infection and should not be used as the sole basis for treatment or other patient management decisions. A negative result may occur with  improper specimen collection/handling, submission of  specimen other than nasopharyngeal swab, presence of viral mutation(s) within the areas targeted by this assay, and inadequate number of viral copies(<138 copies/mL). A negative result must be combined with clinical observations, patient history, and epidemiological information. The expected result is Negative.  Fact Sheet for Patients:  EntrepreneurPulse.com.au  Fact Sheet for Healthcare Providers:  IncredibleEmployment.be  This test is no t yet  approved or cleared by the [place] FDA and  has been authorized for detection and/or diagnosis of SARS-CoV-2 by FDA under an Emergency Use Authorization (EUA). This EUA will remain  in effect (meaning this test can be used) for the duration of the COVID-19 declaration under Section 564(b)(1) of the Act, 21 U.S.C.section 360bbb-3(b)(1), unless the authorization is terminated  or revoked sooner.       Influenza A by PCR NEGATIVE NEGATIVE Final   Influenza B by PCR NEGATIVE NEGATIVE Final    Comment: (NOTE) The Xpert Xpress SARS-CoV-2/FLU/RSV plus assay is intended as an aid in the diagnosis of influenza from Nasopharyngeal swab specimens and should not be used as a sole basis for treatment. Nasal washings and aspirates are unacceptable for Xpert Xpress SARS-CoV-2/FLU/RSV testing.  Fact Sheet for Patients: EntrepreneurPulse.com.au  Fact Sheet for Healthcare Providers: IncredibleEmployment.be  This test is not yet approved or cleared by the [place] FDA and has been authorized for detection and/or diagnosis of SARS-CoV-2 by FDA under an Emergency Use Authorization (EUA). This EUA will remain in effect (meaning this test can be used) for the duration of the COVID-19 declaration under Section 564(b)(1) of the Act, 21 U.S.C. section 360bbb-3(b)(1), unless the authorization is terminated or revoked.  Performed at [facility], [place], [place] [address]   Respiratory (~20 pathogens) panel by PCR     Status: None   Collection Time: 09/22/21 11:30 AM   Specimen: Peritoneal Washings; Respiratory  Result Value Ref Range Status   Adenovirus NOT DETECTED NOT DETECTED Final   Coronavirus 229E NOT DETECTED NOT DETECTED Final    Comment: (NOTE) The Coronavirus on the Respiratory Panel, DOES NOT test for the novel  Coronavirus (2019 nCoV)    Coronavirus HKU1 NOT DETECTED NOT DETECTED Final   Coronavirus NL63 NOT  DETECTED NOT DETECTED Final   Coronavirus OC43 NOT DETECTED NOT DETECTED Final   Metapneumovirus NOT DETECTED NOT DETECTED Final   Rhinovirus / Enterovirus NOT DETECTED NOT DETECTED Final   Influenza A NOT DETECTED NOT DETECTED Final   Influenza B NOT DETECTED NOT DETECTED Final   Parainfluenza Virus 1 NOT DETECTED NOT DETECTED Final   Parainfluenza Virus 2 NOT DETECTED NOT DETECTED Final   Parainfluenza Virus 3 NOT DETECTED NOT DETECTED Final   Parainfluenza Virus 4 NOT DETECTED NOT DETECTED Final   Respiratory Syncytial Virus NOT DETECTED NOT DETECTED Final   Bordetella pertussis NOT DETECTED NOT DETECTED Final   Bordetella Parapertussis NOT DETECTED NOT DETECTED Final   Chlamydophila pneumoniae NOT DETECTED NOT DETECTED Final   Mycoplasma pneumoniae NOT DETECTED NOT DETECTED Final    Comment: Performed at [facility], [place], [place], [place] [address]  Blood Culture (routine x 2)     Status: Abnormal   Collection Time: 09/22/21  1:32 PM   Specimen: BLOOD  Result Value Ref Range Status   Specimen Description   Final    BLOOD LEFT ANTECUBITAL Performed at [facility], [address] [place],  [address]    Special Requests   Final    BOTTLES DRAWN  AEROBIC AND ANAEROBIC Blood Culture results may not be optimal due to an excessive volume of blood received in culture bottles Performed at [facility], [address] [place], [place] [address]    Culture  Setup Time   Final    GRAM POSITIVE COCCI ANAEROBIC BOTTLE ONLY CRITICAL VALUE NOTED.  VALUE IS CONSISTENT WITH PREVIOUSLY REPORTED AND CALLED VALUE.    Culture (A)  Final    STAPHYLOCOCCUS AUREUS SUSCEPTIBILITIES PERFORMED ON PREVIOUS CULTURE WITHIN THE LAST 5 DAYS. Performed at [facility], [place] [address] [place], [place] [address]    Report Status 09/27/2021 FINAL  Final  Blood Culture (routine x 2)     Status: Abnormal   Collection Time: 09/22/21  1:32 PM   Specimen:  BLOOD  Result Value Ref Range Status   Specimen Description   Final    BLOOD LEFT ANTECUBITAL Performed at [facility], [address] [place], [place] [address]    Special Requests   Final    BOTTLES DRAWN AEROBIC AND ANAEROBIC Blood Culture adequate volume Performed at [facility], [address] [place], [place] [address]    Culture  Setup Time   Final    GRAM POSITIVE COCCI IN BOTH AEROBIC AND ANAEROBIC BOTTLES Organism ID to follow CRITICAL RESULT CALLED TO, READ BACK BY AND VERIFIED WITH: Ardeen Garland, PHARMD AT 0725 ON 09/23/21 BY GM Performed at [facility], [address] [place], [place] [address]    Culture METHICILLIN RESISTANT STAPHYLOCOCCUS AUREUS (A)  Final   Report Status 09/25/2021 FINAL  Final   Organism ID, Bacteria METHICILLIN RESISTANT STAPHYLOCOCCUS AUREUS  Final      Susceptibility   Methicillin resistant staphylococcus aureus - MIC*    CIPROFLOXACIN 2 INTERMEDIATE Intermediate     ERYTHROMYCIN >=8 RESISTANT Resistant     GENTAMICIN <=0.5 SENSITIVE Sensitive     OXACILLIN >=4 RESISTANT Resistant     TETRACYCLINE <=1 SENSITIVE Sensitive     VANCOMYCIN 1 SENSITIVE Sensitive     TRIMETH/SULFA <=10 SENSITIVE Sensitive     CLINDAMYCIN <=0.25 SENSITIVE Sensitive     RIFAMPIN <=0.5 SENSITIVE Sensitive     Inducible Clindamycin NEGATIVE Sensitive     * METHICILLIN RESISTANT STAPHYLOCOCCUS AUREUS  Blood Culture ID Panel (Reflexed)     Status: Abnormal   Collection Time: 09/22/21  1:32 PM  Result Value Ref Range Status   Enterococcus faecalis NOT DETECTED NOT DETECTED Final   Enterococcus Faecium NOT DETECTED NOT DETECTED Final   Listeria monocytogenes NOT DETECTED NOT DETECTED Final   Staphylococcus species DETECTED (A) NOT DETECTED Final    Comment: CRITICAL RESULT CALLED TO, READ BACK BY AND VERIFIED WITH: MORGAN HICKS, PHARMD AT 0725 ON 09/23/21 BY GM    Staphylococcus aureus (BCID) DETECTED (A) NOT DETECTED Final     Comment: Methicillin (oxacillin)-resistant Staphylococcus aureus (MRSA). MRSA is predictably resistant to beta-lactam antibiotics (except ceftaroline). Preferred therapy is vancomycin unless clinically contraindicated. Patient requires contact precautions if  hospitalized. CRITICAL RESULT CALLED TO, READ BACK BY AND VERIFIED WITH: MORGAN HICKS, PHARMD AT 0725 ON 09/23/21 BY GM    Staphylococcus epidermidis NOT DETECTED NOT DETECTED Final   Staphylococcus lugdunensis NOT DETECTED NOT DETECTED Final   Streptococcus species NOT DETECTED NOT DETECTED Final   Streptococcus agalactiae NOT DETECTED NOT DETECTED Final   Streptococcus pneumoniae NOT DETECTED NOT DETECTED Final   Streptococcus pyogenes NOT DETECTED NOT DETECTED Final   A.calcoaceticus-baumannii NOT DETECTED NOT DETECTED Final   Bacteroides  fragilis NOT DETECTED NOT DETECTED Final   Enterobacterales NOT DETECTED NOT DETECTED Final   Enterobacter cloacae complex NOT DETECTED NOT DETECTED Final   Escherichia coli NOT DETECTED NOT DETECTED Final   Klebsiella aerogenes NOT DETECTED NOT DETECTED Final   Klebsiella oxytoca NOT DETECTED NOT DETECTED Final   Klebsiella pneumoniae NOT DETECTED NOT DETECTED Final   Proteus species NOT DETECTED NOT DETECTED Final   Salmonella species NOT DETECTED NOT DETECTED Final   Serratia marcescens NOT DETECTED NOT DETECTED Final   Haemophilus influenzae NOT DETECTED NOT DETECTED Final   Neisseria meningitidis NOT DETECTED NOT DETECTED Final   Pseudomonas aeruginosa NOT DETECTED NOT DETECTED Final   Stenotrophomonas maltophilia NOT DETECTED NOT DETECTED Final   Candida albicans NOT DETECTED NOT DETECTED Final   Candida auris NOT DETECTED NOT DETECTED Final   Candida glabrata NOT DETECTED NOT DETECTED Final   Candida krusei NOT DETECTED NOT DETECTED Final   Candida parapsilosis NOT DETECTED NOT DETECTED Final   Candida tropicalis NOT DETECTED NOT DETECTED Final   Cryptococcus neoformans/gattii NOT  DETECTED NOT DETECTED Final   Meth resistant mecA/C and MREJ DETECTED (A) NOT DETECTED Final    Comment: CRITICAL RESULT CALLED TO, READ BACK BY AND VERIFIED WITH: Ardeen Garland, PHARMD AT 0725 ON 09/23/21 BY GM Performed at [facility], [address] [place], [place] [address]   Group A Strep by PCR [facility])     Status: None   Collection Time: 09/22/21  1:40 PM   Specimen: Throat; Sterile Swab  Result Value Ref Range Status   Group A Strep by PCR NOT DETECTED NOT DETECTED Final    Comment: Performed at [facility], [address] [place], [place] [address]  Peritoneal fluid culture w Gram Stain     Status: None   Collection Time: 09/22/21  7:29 PM   Specimen: Peritoneal Washings; Peritoneal Fluid  Result Value Ref Range Status   Specimen Description   Final    PERITONEAL Performed at [facility], [address] [place], [place] [address]    Special Requests   Final    NONE Performed at [facility], [address] [place], [place] [address]    Gram Stain   Final    FEW WBC PRESENT, PREDOMINANTLY MONONUCLEAR NO ORGANISMS SEEN    Culture   Final    NO GROWTH 3 DAYS Performed at [facility], [place] [address], [place], [place] [address]    Report Status 09/26/2021 FINAL  Final  Gastrointestinal Panel by PCR , Stool     Status: None   Collection Time: 09/23/21  4:45 AM   Specimen: Stool  Result Value Ref Range Status   Campylobacter species NOT DETECTED NOT DETECTED Final   Plesimonas shigelloides NOT DETECTED NOT DETECTED Final   Salmonella species NOT DETECTED NOT DETECTED Final   Yersinia enterocolitica NOT DETECTED NOT DETECTED Final   Vibrio species NOT DETECTED NOT DETECTED Final   Vibrio cholerae NOT DETECTED NOT DETECTED Final   Enteroaggregative E coli (EAEC) NOT DETECTED NOT DETECTED Final   Enteropathogenic E coli (EPEC) NOT DETECTED NOT DETECTED Final   Enterotoxigenic E coli (ETEC) NOT DETECTED NOT DETECTED  Final   Shiga like toxin producing E coli (STEC) NOT DETECTED NOT DETECTED Final   Shigella/Enteroinvasive E coli (EIEC) NOT DETECTED NOT DETECTED Final   Cryptosporidium NOT DETECTED NOT DETECTED Final   Cyclospora cayetanensis NOT DETECTED NOT DETECTED Final   Entamoeba histolytica NOT DETECTED  NOT DETECTED Final   Giardia lamblia NOT DETECTED NOT DETECTED Final   Adenovirus F40/41 NOT DETECTED NOT DETECTED Final   Astrovirus NOT DETECTED NOT DETECTED Final   Norovirus GI/GII NOT DETECTED NOT DETECTED Final   Rotavirus A NOT DETECTED NOT DETECTED Final   Sapovirus (I, II, IV, and V) NOT DETECTED NOT DETECTED Final    Comment: Performed at [facility], [address], [place], [place] [address]  C Difficile Quick Screen w PCR reflex     Status: None   Collection Time: 09/23/21  4:45 AM   Specimen: Stool  Result Value Ref Range Status   C Diff antigen NEGATIVE NEGATIVE Final   C Diff toxin NEGATIVE NEGATIVE Final   C Diff interpretation No C. difficile detected.  Final    Comment: Performed at [facility], [address] [place], [place] [address]  CULTURE, BLOOD (ROUTINE X 2) w Reflex to ID Panel     Status: None   Collection Time: 09/25/21  6:12 AM   Specimen: BLOOD  Result Value Ref Range Status   Specimen Description BLOOD LEFT ARM  Final   Special Requests   Final    BOTTLES DRAWN AEROBIC AND ANAEROBIC Blood Culture adequate volume   Culture   Final    NO GROWTH 5 DAYS Performed at [facility], [address] [place], [place] [address]    Report Status 09/30/2021 FINAL  Final  CULTURE, BLOOD (ROUTINE X 2) w Reflex to ID Panel     Status: None   Collection Time: 09/25/21  6:12 AM   Specimen: BLOOD  Result Value Ref Range Status   Specimen Description BLOOD RIGHT HAND  Final   Special Requests   Final    BOTTLES DRAWN AEROBIC AND ANAEROBIC Blood Culture adequate volume   Culture   Final    NO GROWTH 5 DAYS Performed at [facility], [address], [place], [place] [address]    Report Status 09/30/2021 FINAL  Final  Aerobic/Anaerobic Culture w Gram Stain (surgical/deep wound)     Status: None   Collection Time: 09/26/21  2:58 PM   Specimen: Foot; Wound  Result Value Ref Range Status   Specimen Description   Final    FOOT LEFT Performed at [facility], [address] [place], [place] [address]    Special Requests   Final    NONE Performed at [facility], [address] [place], [place] [address]    Gram Stain   Final    NO SQUAMOUS EPITHELIAL CELLS SEEN FEW WBC SEEN FEW GRAM POSITIVE COCCI    Culture   Final    MODERATE METHICILLIN RESISTANT STAPHYLOCOCCUS AUREUS NO ANAEROBES ISOLATED Performed at [facility], [place] [address] [place], [place] [address]    Report Status 10/01/2021 FINAL  Final   Organism ID, Bacteria METHICILLIN RESISTANT STAPHYLOCOCCUS AUREUS  Final      Susceptibility   Methicillin resistant staphylococcus aureus - MIC*    CIPROFLOXACIN 2 INTERMEDIATE Intermediate     ERYTHROMYCIN >=8 RESISTANT Resistant     GENTAMICIN <=0.5 SENSITIVE Sensitive     OXACILLIN >=4 RESISTANT Resistant     TETRACYCLINE <=1 SENSITIVE Sensitive     VANCOMYCIN 1 SENSITIVE Sensitive     TRIMETH/SULFA <=10 SENSITIVE Sensitive     CLINDAMYCIN <=0.25 SENSITIVE Sensitive     RIFAMPIN <=0.5 SENSITIVE Sensitive     Inducible Clindamycin NEGATIVE Sensitive     * MODERATE  METHICILLIN RESISTANT STAPHYLOCOCCUS AUREUS  Surgical PCR screen     Status: Abnormal   Collection Time: 09/27/21  4:43 AM   Specimen: Nasal Mucosa; Nasal Swab  Result Value Ref Range Status   MRSA, PCR POSITIVE (A) NEGATIVE Final    Comment: RESULT CALLED TO, READ BACK BY AND VERIFIED WITH: CHARLOTTE KYEI AT 5643 ON 09/27/21 [place].    Staphylococcus aureus POSITIVE (A) NEGATIVE Final    Comment: (NOTE) The Xpert SA Assay (FDA approved for NASAL specimens in patients 93 years of age and older), is one  component of a comprehensive surveillance program. It is not intended to diagnose infection nor to guide or monitor treatment. Performed at [facility], [address] [place], [place] [address]   Aerobic/Anaerobic Culture w Gram Stain (surgical/deep wound)     Status: None   Collection Time: 09/27/21  9:17 AM   Specimen: PATH Digit amputation; Tissue  Result Value Ref Range Status   Specimen Description   Final    WOUND Performed at [facility], [address], [place], [place] [address]    Special Requests   Final    DIGIT Performed at [facility], [place], [place] [address]    Gram Stain   Final    NO SQUAMOUS EPITHELIAL CELLS SEEN FEW WBC SEEN FEW GRAM POSITIVE COCCI    Culture   Final    FEW METHICILLIN RESISTANT STAPHYLOCOCCUS AUREUS NO ANAEROBES ISOLATED Performed at [facility], [place] [address], [place], [place] [address]    Report Status 10/02/2021 FINAL  Final   Organism ID, Bacteria METHICILLIN RESISTANT STAPHYLOCOCCUS AUREUS  Final      Susceptibility   Methicillin resistant staphylococcus aureus - MIC*    CIPROFLOXACIN 2 INTERMEDIATE Intermediate     ERYTHROMYCIN >=8 RESISTANT Resistant     GENTAMICIN <=0.5 SENSITIVE Sensitive     OXACILLIN >=4 RESISTANT Resistant     TETRACYCLINE <=1 SENSITIVE Sensitive     VANCOMYCIN <=0.5 SENSITIVE Sensitive     TRIMETH/SULFA <=10 SENSITIVE Sensitive     CLINDAMYCIN <=0.25 SENSITIVE Sensitive     RIFAMPIN <=0.5 SENSITIVE Sensitive     Inducible Clindamycin NEGATIVE Sensitive     * FEW METHICILLIN RESISTANT STAPHYLOCOCCUS AUREUS    Coagulation Studies: No results for input(s): LABPROT, INR in the last 72 hours.   Urinalysis: No results for input(s): COLORURINE, LABSPEC, PHURINE, GLUCOSEU, HGBUR, BILIRUBINUR, KETONESUR, PROTEINUR, UROBILINOGEN, NITRITE, LEUKOCYTESUR in the last 72 hours.  Invalid input(s): APPERANCEUR    Imaging: No results  found.   Medications:    sodium chloride 250 mL (10/04/21 1240)   DAPTOmycin (CUBICIN)  IV 800 mg (10/04/21 1244)   dialysis solution 1.5% low-MG/low-CA     dialysis solution 2.5% low-MG/low-CA      sodium chloride   Intravenous Once   abacavir  300 mg Oral BID   amLODipine  10 mg Oral Daily   carvedilol  25 mg Oral BID   Chlorhexidine Gluconate Cloth  6 each Topical Daily   dolutegravir  50 mg Oral QHS   gentamicin cream  1 application Topical Daily   heparin  5,000 Units Subcutaneous Q8H   insulin aspart  0-6 Units Subcutaneous TID WC   [employer] Standard 1.0  325 mL Oral BID   lamiVUDine  25 mg Oral QHS   losartan  100 mg Oral QPM   minoxidil  2.5 mg Oral BID   multivitamin  1 tablet Oral Daily  sodium chloride flush  3 mL Intravenous Q12H   sodium chloride, acetaminophen, heparin, hydrALAZINE, labetalol, loperamide, oxyCODONE, sodium chloride flush  Assessment/ Plan:  [name] is a 50 y.o. black male with end stage renal disease on peritoneal dialysis, HIV on HART, hypertension, diabetes mellitus type II, diabetic neuropathy who is admitted to [facility] on 09/22/2021 on Leukocytosis [D72.829] Uremia [N19] Generalized abdominal pain [R10.84] ESRD on peritoneal dialysis ([place]) [N18.6, Z99.2] Acute sepsis ([place]) [A41.9]  [facility] 102.5kg CCPD 9 hours 5 exchanges 2.7 liter fills   End stage renal disease: no indication that patient has peritonitis.  - Continue peritoneal dialysis nightly during this admission.  - Treatment went well last night, alarm resolved by nursing staff.     Hypertension:  -Managed with amlodipine, carvedilol, Cozaar and minoxidil -BP 149/70  Anemia with chronic kidney disease:  Lab Results  Component Value Date   HGB 8.2 (L) 10/04/2021    Hgb not at target, EPO 20,000 units weekly subQ.  Diabetes mellitus type II with chronic kidney disease: continue glucose control. Hemoglobin A1c of 6.2%.  Managed by  primary team  Hypokalemia -   Lab Results  Component Value Date   K 4.3 10/04/2021   Resolved   Left foot osteomyelitis with Sepsis with bacteremia and fever: MRSA in blood cultures on 09/22/21. Status post amputation on 11/20 with I&D on 09/30/2021 - IV daptomycin x 6 weeks. Recommended dose 800 mg iv every 48 hrs.End date 11/01/2021 Lt PICC placed on 09/29/21 for home antibiotics.   - appreciate podiatry, ID and cardiology input.   CM arranging home care needs    LOS: 10 Julea Hutto 11/27/20222:26 PM

## 2021-10-04 NOTE — Plan of Care (Signed)

## 2021-10-04 NOTE — Progress Notes (Signed)
Progress Note    JACQUEL MCCAMISH   TIR:[id number]  DOB: 11/04/1971  DOA: 09/22/2021     10 PCP: Judeth Cornfield, MD  Initial CC: [facility] Course: Mr. Mcphatter is a 50  yo male with PMH ESRD on PD, HIV, DM1, OSA, HTN who presented with generalized malaise.  He was admitted for further work-up regarding possible sources of infection. Ultimately he developed purulent drainage from his left foot and further work-up of this showed acute osteomyelitis with underlying first MTP joint effusion concerning for septic arthritis as well as an underlying soft tissue abscess. He was evaluated by podiatry and underwent left partial first ray amputation on 09/27/2021. Ongoing work-up also revealed MRSA bacteremia during hospitalization.  He underwent evaluation by ID and also underwent endocarditis work-up with TEE.  He was recommended to continue on antibiotic coverage for 4 weeks.  He was treated with vancomycin initially which was transitioned to daptomycin for ease of administration prior to discharge. Due to ongoing purulent drainage from his wound, he required repeat I&D with podiatry on 09/30/2021.  Interval History:  No events overnight.  Resting in bed comfortably this morning.  Understands we are still awaiting home health disposition planning.  Assessment & Plan: * Bacteremia due to Staphylococcus aureus - 11/15 blood culture positive for MRSA; repeat blood culture from 09/25/21 is negative - followed by ID - s/p TTE on 11/15 and TEE on 11/17; no signs of endocarditis or veg - s/p vanc, now on daptomycin for 4 week course; underwent tunneled left IJ placement with IR on 11/22 for outpatient abx  Osteomyelitis ([place]) - see bacteremia as well - s/p left partial first ray amputation on 09/27/2021. Due to ongoing purulent drainage he underwent repeat I&D on 11/23 as well - he is recommended for NWB to left foot per podiatry; outpt follow up in 2 weeks - dressing changes recommended  2-3 times per week as well; follow up [place] orders prior to discharge if able to get RN for assistance as he has no help for this at home; discharge still pending adequate HH plan - continue daptomycin   ESRD on dialysis ([place]) - on PD outpatient - no peritonitis on workup on admission as well  - nephrology following for ongoing PD while inpatient, appreciate assistance   OSA (obstructive sleep apnea) - not on CPAP at home   HIV disease ([place]) - CD4 302 - followed at [place] per notes - continue ART  Essential hypertension - Initially low/normal blood pressure and home meds held which have been slowly resumed  Type 1 diabetes ([place]) - continue SSI and CBG monitoring     Old records reviewed in assessment of this patient  Antimicrobials:  Abacavir 09/22/2021 >> current  Dolutegravir 09/22/2021 >> current Lamivudine 09/23/2021 >> current   Cefepime 09/22/2021 x 1 Flagyl 09/22/2021 x 1 Vancomycin 09/22/2021 >> 09/23/21 Dapto 09/28/21 >> current   DVT prophylaxis: HSQ  Code Status:   Code Status: Full Code  Disposition Plan:  Stable but awaiting HH plan Status is: Inpatient  Objective: Blood pressure (!) 149/70, pulse 84, temperature 97.8 F (36.6 C), temperature source Oral, resp. rate 18, height 6' 6"  (1.981 m), weight 101.2 kg, SpO2 99 %.  Examination:  Physical Exam Constitutional:      General: He is not in acute distress.    Appearance: Normal appearance.  HENT:     Head: Normocephalic and atraumatic.     Mouth/Throat:     Mouth: Mucous membranes are  moist.  Eyes:     Extraocular Movements: Extraocular movements intact.  Neck:     Comments: Left tunnled IJ noted Cardiovascular:     Rate and Rhythm: Normal rate and regular rhythm.     Heart sounds: Normal heart sounds.  Pulmonary:     Effort: Pulmonary effort is normal. No respiratory distress.     Breath sounds: Normal breath sounds. No wheezing.  Abdominal:     General: Bowel sounds are normal. There is no  distension.     Palpations: Abdomen is soft.     Tenderness: There is no abdominal tenderness.     Comments: PD cath in place  Musculoskeletal:     Cervical back: Normal range of motion and neck supple.     Comments: Left foot dressed with bandage   Skin:    General: Skin is warm and dry.  Neurological:     General: No focal deficit present.     Mental Status: He is alert.  Psychiatric:        Mood and Affect: Mood normal.        Behavior: Behavior normal.     Consultants:  Podiatry ID  Procedures:  Left partial first ray amputation on 09/27/2021  Data Reviewed: I have personally reviewed labs and imaging studies     LOS: 10 days  Time spent: Greater than 50% of the 35 minute visit was spent in counseling/coordination of care for the patient as laid out in the A&P.   Dwyane Dee, MD [facility] 10/04/2021, 1:50 PM

## 2021-10-05 DIAGNOSIS — N2589 Other disorders resulting from impaired renal tubular function: Secondary | ICD-10-CM | POA: Diagnosis not present

## 2021-10-05 DIAGNOSIS — D509 Iron deficiency anemia, unspecified: Secondary | ICD-10-CM | POA: Diagnosis not present

## 2021-10-05 DIAGNOSIS — N186 End stage renal disease: Secondary | ICD-10-CM | POA: Diagnosis not present

## 2021-10-05 DIAGNOSIS — R17 Unspecified jaundice: Secondary | ICD-10-CM | POA: Diagnosis not present

## 2021-10-05 DIAGNOSIS — N2581 Secondary hyperparathyroidism of renal origin: Secondary | ICD-10-CM | POA: Diagnosis not present

## 2021-10-05 DIAGNOSIS — E440 Moderate protein-calorie malnutrition: Secondary | ICD-10-CM | POA: Diagnosis not present

## 2021-10-05 DIAGNOSIS — Z992 Dependence on renal dialysis: Secondary | ICD-10-CM | POA: Diagnosis not present

## 2021-10-05 DIAGNOSIS — D631 Anemia in chronic kidney disease: Secondary | ICD-10-CM | POA: Diagnosis not present

## 2021-10-05 DIAGNOSIS — Z79899 Other long term (current) drug therapy: Secondary | ICD-10-CM | POA: Diagnosis not present

## 2021-10-05 LAB — CBC WITH DIFFERENTIAL/PLATELET
Abs Immature Granulocytes: 0.1 10*3/uL — ABNORMAL HIGH (ref 0.00–0.07)
Basophils Absolute: 0.1 10*3/uL (ref 0.0–0.1)
Basophils Relative: 1 %
Eosinophils Absolute: 0.2 10*3/uL (ref 0.0–0.5)
Eosinophils Relative: 2 %
HCT: 21.8 % — ABNORMAL LOW (ref 39.0–52.0)
Hemoglobin: 7.6 g/dL — ABNORMAL LOW (ref 13.0–17.0)
Immature Granulocytes: 1 %
Lymphocytes Relative: 29 %
Lymphs Abs: 2.6 10*3/uL (ref 0.7–4.0)
MCH: 29.2 pg (ref 26.0–34.0)
MCHC: 34.9 g/dL (ref 30.0–36.0)
MCV: 83.8 fL (ref 80.0–100.0)
Monocytes Absolute: 0.8 10*3/uL (ref 0.1–1.0)
Monocytes Relative: 9 %
Neutro Abs: 5.3 10*3/uL (ref 1.7–7.7)
Neutrophils Relative %: 58 %
Platelets: 297 10*3/uL (ref 150–400)
RBC: 2.6 MIL/uL — ABNORMAL LOW (ref 4.22–5.81)
RDW: 14.3 % (ref 11.5–15.5)
WBC: 9.1 10*3/uL (ref 4.0–10.5)
nRBC: 0 % (ref 0.0–0.2)

## 2021-10-05 LAB — BASIC METABOLIC PANEL
Anion gap: 13 (ref 5–15)
BUN: 65 mg/dL — ABNORMAL HIGH (ref 6–20)
CO2: 23 mmol/L (ref 22–32)
Calcium: 8.6 mg/dL — ABNORMAL LOW (ref 8.9–10.3)
Chloride: 93 mmol/L — ABNORMAL LOW (ref 98–111)
Creatinine, Ser: 14.6 mg/dL — ABNORMAL HIGH (ref 0.61–1.24)
GFR, Estimated: 4 mL/min — ABNORMAL LOW (ref >60)
Glucose, Bld: 170 mg/dL — ABNORMAL HIGH (ref 70–99)
Potassium: 4.3 mmol/L (ref 3.5–5.1)
Sodium: 129 mmol/L — ABNORMAL LOW (ref 135–145)

## 2021-10-05 LAB — CK: Total CK: 208 U/L (ref 49–397)

## 2021-10-05 LAB — GLUCOSE, CAPILLARY
Glucose-Capillary: 186 mg/dL — ABNORMAL HIGH (ref 70–99)
Glucose-Capillary: 129 mg/dL — ABNORMAL HIGH (ref 70–99)

## 2021-10-05 LAB — MAGNESIUM: Magnesium: 2.3 mg/dL (ref 1.7–2.4)

## 2021-10-05 MED ORDER — LOSARTAN POTASSIUM 100 MG PO TABS: 100.0000 mg | ORAL_TABLET | Freq: Every day | ORAL | 3 refills | Status: AC

## 2021-10-05 MED ORDER — DAPTOMYCIN IV (FOR PTA / DISCHARGE USE ONLY): 800.0000 mg | INTRAVENOUS | 0 refills | Status: DC

## 2021-10-05 NOTE — Discharge Summary (Signed)
Physician Discharge Summary   Patient name: Nathan Ayers  Admit date:     09/22/2021  Discharge date: 10/05/2021  Discharge Physician: Nathan Ayers   PCP: Nathan Cornfield, MD   Recommendations at discharge:  Follow up with ID and podiatry  Discharge Diagnoses Principal Problem:   Bacteremia due to Staphylococcus aureus Active Problems:   Osteomyelitis ([place])   ESRD on dialysis ([place])   HIV disease ([place])   OSA (obstructive sleep apnea)   Type 1 diabetes ([place])   Essential hypertension   Resolved Diagnoses Resolved Problems:   * No resolved hospital problems. [facility] Course   [name] is a 50  yo male with PMH ESRD on PD, HIV, DM1, OSA, HTN who presented with generalized malaise.  He was admitted for further work-up regarding possible sources of infection. Ultimately he developed purulent drainage from his left foot and further work-up of this showed acute osteomyelitis with underlying first MTP joint effusion concerning for septic arthritis as well as an underlying soft tissue abscess. He was evaluated by podiatry and underwent left partial first ray amputation on 09/27/2021. Ongoing work-up also revealed MRSA bacteremia during hospitalization.  He underwent evaluation by ID and also underwent endocarditis work-up with TEE.  He was recommended to continue on antibiotic coverage for 4 weeks.  He was treated with vancomycin initially which was transitioned to daptomycin for ease of administration prior to discharge. Due to ongoing purulent drainage from his wound, he required repeat I&D with podiatry on 09/30/2021. See below for further A&P.   * Bacteremia due to Staphylococcus aureus - 11/15 blood culture positive for MRSA; repeat blood culture from 09/25/21 is negative - followed by ID - s/p TTE on 11/15 and TEE on 11/17; no signs of endocarditis or veg - s/p vanc, now on daptomycin for 4 week course; underwent tunneled left IJ placement with IR on 11/22 for  outpatient abx -Outpatient follow-up with ID at discharge.  Daptomycin arranged through home infusions  Osteomyelitis (H[facility]) - see bacteremia as well - s/p left partial first ray amputation on 09/27/2021. Due to ongoing purulent drainage he underwent repeat I&D on 11/23 as well - he is recommended for NWB to left foot per podiatry; outpt follow up on 12/13 -No further packing required at discharge.  Patient transitioned to bulky dressing to remain intact and in place until podiatry follow-up - continue daptomycin   ESRD on dialysis ([place]) - on PD outpatient - no peritonitis on workup on admission as well  OSA (obstructive sleep apnea) - not on CPAP at home   HIV disease ([place]) - CD4 302 - followed at [facility] per notes - continue ART  Essential hypertension - Initially low/normal blood pressure and home meds held which have been slowly resumed  Type 1 diabetes ([place]) - Continue home regimen      Procedures performed: Left partial first ray amputation on 09/27/2021  Condition at discharge: stable  Exam Physical Exam Constitutional:      General: He is not in acute distress.    Appearance: Normal appearance.  HENT:     Head: Normocephalic and atraumatic.     Mouth/Throat:     Mouth: Mucous membranes are moist.  Eyes:     Extraocular Movements: Extraocular movements intact.  Neck:     Comments: Left tunnled IJ noted Cardiovascular:     Rate and Rhythm: Normal rate and regular rhythm.     Heart sounds: Normal heart sounds.  Pulmonary:  Effort: Pulmonary effort is normal. No respiratory distress.     Breath sounds: Normal breath sounds. No wheezing.  Abdominal:     General: Bowel sounds are normal. There is no distension.     Palpations: Abdomen is soft.     Tenderness: There is no abdominal tenderness.     Comments: PD cath in place  Musculoskeletal:     Cervical back: Normal range of motion and neck supple.     Comments: Left foot dressed with bandage   Skin:     General: Skin is warm and dry.  Neurological:     General: No focal deficit present.     Mental Status: He is alert.  Psychiatric:        Mood and Affect: Mood normal.        Behavior: Behavior normal.     Disposition: Home  Discharge time: greater than 30 minutes.  Follow-up Information     Schnier, Dolores Lory, MD Follow up in 1 month(s).   Specialties: Vascular Surgery, Cardiology, Radiology, Vascular Surgery Why: Re-establish care. PAD. Can see Civil engineer, contracting or Arna Medici. Will need ABI with visit. Contact information: [place] [address] [phone]                 Allergies as of 10/05/2021       Reactions   Lactose Intolerance (gi)         Medication List     TAKE these medications    abacavir 300 MG tablet Commonly known as: ZIAGEN Take 300 mg by mouth 2 (two) times daily.   amLODipine 5 MG tablet Commonly known as: NORVASC Take 5 mg by mouth daily.   carvedilol 25 MG tablet Commonly known as: COREG Take 25 mg by mouth 2 (two) times daily.   cinacalcet 30 MG tablet Commonly known as: SENSIPAR Take 30 mg by mouth daily.   daptomycin  IVPB Commonly known as: CUBICIN Inject 800 mg into the vein every other day. Indication:  MRSA bacteremia and MRSA left foot infection First Dose: Yes Last Day of Therapy:  11/01/2021 Labs - Once weekly on Mondays:  CBC/D, CMP, CRP, ESR and CPK Method of administration: IV Push Method of administration may be changed at the discretion of home infusion pharmacist based upon assessment of the patient and/or caregiver's ability to self-administer the medication ordered. Patient needs appointment with IR to remove central line at the end of treatment Fax weekly labs to Dr Delaine Lame [phone] Clinic follow-up appointment with Dr Delaine Lame 10/20/21 at 10:15am Call [phone] with any critical value or questions   feeding supplement (NEPRO CARB STEADY) Liqd Take 237 mLs by mouth as needed  (missed meal during dialysis.).   lamiVUDine 10 MG/ML solution Commonly known as: EPIVIR Take 2.5 mLs by mouth at bedtime.   lanthanum 1000 MG chewable tablet Commonly known as: FOSRENOL Chew 2,000 mg by mouth 3 (three) times daily.   losartan 100 MG tablet Commonly known as: COZAAR Take 1 tablet (100 mg total) by mouth daily. What changed:  when to take this Another medication with the same name was removed. Continue taking this medication, and follow the directions you see here.   minoxidil 2.5 MG tablet Commonly known as: LONITEN Take 2.5 mg by mouth See admin instructions. TAKES TWICE DAILY, EXCEPT FOR THE AM DOSE ON DIALYSIS DAYS; MON, WED, FRI   ondansetron 4 MG disintegrating tablet Commonly known as: ZOFRAN-ODT Take 4 mg by mouth every  8 (eight) hours as needed for nausea or vomiting.   Santyl ointment Generic drug: collagenase Apply 1 application topically daily. APPLIES TO FOOT USING GAUZE THEN WRAPS FOOT   Tivicay 50 MG tablet Generic drug: dolutegravir Take 50 mg by mouth 2 (two) times daily.               Durable Medical Equipment  (From admission, onward)           Start     Ordered   10/05/21 1155  For home use only DME 3 n 1  Once        10/05/21 1155   10/05/21 1155  For home use only DME Walker rolling  Once       Comments: With extenders  Question Answer Comment  Walker: With [place]   Patient needs a walker to treat with the following condition Weakness      10/05/21 1155              Discharge Care Instructions  (From admission, onward)           Start     Ordered   10/05/21 0000  Change dressing on IV access line weekly and PRN  (Home infusion instructions - [facility] )        10/05/21 1204   10/05/21 0000  Discharge wound care:       Comments: Apply bulky sterile dressing and leave intact till followup with podiatry.   10/05/21 1207            CT Abdomen Pelvis Wo Contrast  Result Date:  09/22/2021 CLINICAL DATA:  Body aches and congestion with cough and sore throat. EXAM: CT ABDOMEN AND PELVIS WITHOUT CONTRAST TECHNIQUE: Multidetector CT imaging of the abdomen and pelvis was performed following the standard protocol without IV contrast. COMPARISON:  None. FINDINGS: Lower chest: No acute abnormality. Hepatobiliary: No focal liver abnormality is seen. The gallbladder is moderately distended. No gallstones, gallbladder wall thickening, or biliary dilatation. Pancreas: Unremarkable. No pancreatic ductal dilatation or surrounding inflammatory changes. Spleen: Normal in size without focal abnormality. Adrenals/Urinary Tract: Adrenal glands are unremarkable. Kidneys are normal in size, without renal calculi or hydronephrosis. Multiple subcentimeter exophytic cystic appearing areas are seen involving both kidneys. The urinary bladder is partially contracted. Mild to moderate severity diffuse urinary bladder wall thickening is also seen. Stomach/Bowel: Stomach is within normal limits. Appendix appears normal. No evidence of bowel wall thickening, distention, or inflammatory changes. Vascular/Lymphatic: Aortic atherosclerosis. Subcentimeter para-aortic and aortocaval lymph nodes are present. Reproductive: Prostate is unremarkable. Other: No abdominal wall hernia or abnormality. Intact tubing from a peritoneal dialysis catheter is noted with its distal end seen just above the level of the urinary bladder. No abdominopelvic ascites. Musculoskeletal: No acute or significant osseous findings. IMPRESSION: 1. Urinary bladder wall thickening which may be secondary to mild to moderate severity cystitis. Correlation with urinalysis is recommended. 2. Findings likely representing small renal cysts. Correlation with nonemergent renal ultrasound is recommended. 3. Aortic atherosclerosis. Aortic Atherosclerosis (ICD10-I70.0). Electronically Signed   By: Virgina Norfolk M.D.   On: 09/22/2021 16:51   CT CHEST WO  CONTRAST  Result Date: 09/22/2021 CLINICAL DATA:  Cough, persistent EXAM: CT CHEST WITHOUT CONTRAST TECHNIQUE: Multidetector CT imaging of the chest was performed following the standard protocol without IV contrast. COMPARISON:  No prior chest CT FINDINGS: Cardiovascular: Moderate coronary and mild aortic atherosclerotic calcifications. No significant vascular findings. The heart is normal in size. No pericardial  effusion. Mediastinum/Nodes: No enlarged mediastinal or axillary lymph nodes. Thyroid gland, trachea, and esophagus demonstrate no significant findings. Lungs/Pleura: No focal pulmonary opacity. No pleural effusion or pneumothorax. Upper Abdomen: Please see same day CT abdomen pelvis. Musculoskeletal: No chest wall mass or suspicious bone lesions identified. IMPRESSION: 1. No acute process in the chest. No etiology is seen for the patient's cough. 2. Aortic Atherosclerosis (ICD10-I70.0). Coronary artery atherosclerosis. Electronically Signed   By: Merilyn Baba M.D.   On: 09/22/2021 17:51   CT ANGIO LOW EXTREM LEFT W &/OR WO CONTRAST  Result Date: 09/28/2021 CLINICAL DATA:  Reduction defect, lower limb Soft tissue mass, foot, spontaneous hemorrhage EXAM: CT OF THE LOWER LEFT EXTREMITY WITHOUT CONTRAST TECHNIQUE: Multidetector CT imaging of the lower left extremity was performed according to the standard protocol. COMPARISON:  Radiographs same day FINDINGS: Vascular: Left lower extremity Common iliac artery: Patent without significant stenosis or aneurysm. Scattered calcific atherosclerosis without significant stenosis. Internal iliac artery: Patent. External iliac artery: Patent without significant stenosis. Common femoral artery: Patent without significant stenosis. Profunda femoral artery: Patent without significant stenosis. Superficial femoral artery: Patent without significant stenosis. Scattered calcific atherosclerosis without significant stenosis. Popliteal artery: Patent without significant  stenosis or aneurysm. Tibioperoneal trunk: Patent without significant stenosis. Runoff vessels: Patent without significant stenosis. There is a patent three-vessel runoff to the level of the ankle, scattered calcific atherosclerotic disease without significant stenosis or occlusion appreciated. Nonvascular: Status post amputation of the first and second toes with foci of gas and scattered radiodensities, presumably postsurgical in nature. No drainable organized fluid collection. IMPRESSION: 1. The left lower extremity arteries are patent without significant stenosis. There is a three-vessel runoff to the level of the ankle. No findings of active arterial extravasation or pseudoaneurysm associated with the surgical bed. 2. Status post amputation of the first and second toes with surgical defect containing foci of gas and antibiotic beads, presumably postsurgical although underlying infection can not be excluded. No drainable fluid collection. Electronically Signed   By: Albin Felling M.D.   On: 09/28/2021 08:45   MR FOOT LEFT WO CONTRAST  Result Date: 09/26/2021 CLINICAL DATA:  Osteomyelitis, foot left foot abscess, MRSA bacteremia EXAM: MRI OF THE LEFT FOOT WITHOUT CONTRAST TECHNIQUE: Multiplanar, multisequence MR imaging of the left foot was performed. No intravenous contrast was administered. COMPARISON:  X-ray 09/22/2021 FINDINGS: Bones/Joint/Cartilage Extensive bone marrow edema with low T1 signal changes involving the proximal and distal phalanx of the great toe. There is also involvement of the first metatarsal head and distal diaphysis. Serpiginous areas of T2 hyperintense signal within the first metatarsal diaphysis likely indicative of underlying bone infarct. Large complex first MTP joint effusion suggesting septic arthritis. Status post fifth ray resection at the level of the fifth metatarsal base. Preserved signal within the residual fifth metatarsal base. Remaining osseous structures of the foot  are within normal limits. No additional sites of bone marrow edema. Ligaments Intact Lisfranc ligament. No evidence of collateral ligament disruption. Muscles and Tendons Chronic denervation changes of the intrinsic foot musculature with probable superimposed myositis. Amputation changes at the lateral forefoot. Mild tenosynovitis involving the flexor and extensor tendons of the great toe distally. Soft tissues Soft tissue ulceration along the medial aspect of the great toe at the level of the first metatarsal head. Underlying contiguous fluid collection measures approximately 5.6 x 4.2 x 4.0 cm, which appears to be contiguous with the first MTP joint space. Diffuse soft tissue edema. IMPRESSION: 1. Soft tissue ulceration at the level of  the first metatarsal head. Findings of acute osteomyelitis involving the proximal and distal phalanx of the great toe as well as the first metatarsal head and distal diaphysis. 2. Large complex first MTP joint effusion compatible with septic arthritis. 3. Soft tissue abscess adjacent to the first metatarsal head measuring up to 5.6 cm. 4. Mild tenosynovitis involving the flexor and extensor tendons of the great toe distally. 5. Status post fifth ray resection at the level of the fifth metatarsal base. Preserved signal within the residual fifth metatarsal base. Electronically Signed   By: Davina Poke D.O.   On: 09/26/2021 16:02   US Venous Img Lower Unilateral Left (DVT)  Result Date: 09/25/2021 CLINICAL DATA:  Left lower extremity pain for the past 3 weeks. Evaluate for DVT. EXAM: LEFT LOWER EXTREMITY VENOUS DOPPLER ULTRASOUND TECHNIQUE: Gray-scale sonography with graded compression, as well as color Doppler and duplex ultrasound were performed to evaluate the lower extremity deep venous systems from the level of the common femoral vein and including the common femoral, femoral, profunda femoral, popliteal and calf veins including the posterior tibial, peroneal and  gastrocnemius veins when visible. The superficial great saphenous vein was also interrogated. Spectral Doppler was utilized to evaluate flow at rest and with distal augmentation maneuvers in the common femoral, femoral and popliteal veins. COMPARISON:  CT the chest, abdomen and pelvis-09/22/2021 FINDINGS: Contralateral Common Femoral Vein: Respiratory phasicity is normal and symmetric with the symptomatic side. No evidence of thrombus. Normal compressibility. Common Femoral Vein: No evidence of thrombus. Normal compressibility, respiratory phasicity and response to augmentation. Saphenofemoral Junction: No evidence of thrombus. Normal compressibility and flow on color Doppler imaging. Profunda Femoral Vein: No evidence of thrombus. Normal compressibility and flow on color Doppler imaging. Femoral Vein: No evidence of thrombus. Normal compressibility, respiratory phasicity and response to augmentation. Popliteal Vein: No evidence of thrombus. Normal compressibility, respiratory phasicity and response to augmentation. Calf Veins: No evidence of thrombus. Normal compressibility and flow on color Doppler imaging. Superficial Great Saphenous Vein: No evidence of thrombus. Normal compressibility. Venous Reflux:  None. Other Findings: Note is made of mildly prominent though non pathologically enlarged left inguinal lymph nodes with index left inguinal lymph node measuring 1.1 cm in greatest short axis diameter (image 14). Borderline enlarged right inguinal lymph node measures 1.5 cm in greatest short axis diameter with partial thickening on its cortex though maintenance of a benign fatty hilum (image 40). IMPRESSION: 1. No evidence of DVT within the left lower extremity. 2. Prominent bilateral inguinal lymph nodes, right greater than left, similar to recent abdominal CT, nonspecific though presumably reactive in etiology in the setting peritoneal dialysis. Clinical correlation is advised. Electronically Signed   By: Sandi Mariscal M.D.   On: 09/25/2021 12:44   US ARTERIAL ABI (SCREENING LOWER EXTREMITY)  Result Date: 09/27/2021 CLINICAL DATA:  50 year old male with a history foot abscess EXAM: NONINVASIVE PHYSIOLOGIC VASCULAR STUDY OF BILATERAL LOWER EXTREMITIES TECHNIQUE: Evaluation of both lower extremities was performed at rest, including calculation of ankle-brachial indices, multiple segmental pressure evaluation, segmental Doppler and segmental pulse volume recording. COMPARISON:  None. FINDINGS: Right ABI:  Noncompressible Left ABI:  1.43 Right Lower Extremity: Segmental Doppler demonstrates triphasic waveforms at the right ankle. Left Lower Extremity: Segmental Doppler at the left ankle demonstrates monophasic posterior tibial artery and biphasic dorsalis pedis with decreased amplitude. IMPRESSION: Right: Resting ABI is noncompressible. Segmental exam demonstrates triphasic waveforms at the right ankle. Left: Resting ABI is likely falsely elevated secondary to noncompressible vessels. Segmental exam  at the left ankle demonstrates evidence of developing more proximal occlusive disease Signed, Dulcy Fanny. Dellia Nims, RPVI [facility] Radiology Electronically Signed   By: Corrie Mckusick D.O.   On: 09/27/2021 08:30   IR US Guidance  Result Date: 09/29/2021 CLINICAL DATA:  Infection of left foot with osteomyelitis of the first toe requiring amputation. The patient requires a tunneled central venous catheter for home IV antibiotics and has a history of end-stage renal disease and previous right arm AV fistula placement. EXAM: TUNNELED CENTRAL VENOUS CATHETER PLACEMENT WITH ULTRASOUND AND FLUOROSCOPIC GUIDANCE ANESTHESIA/SEDATION: Moderate (conscious) sedation was employed during this procedure. A total of Versed 1.0 mg and Fentanyl 50 mcg was administered intravenously by radiology nursing under my supervision. Moderate Sedation Time: 18 minutes. The patient's level of  consciousness and vital signs were monitored continuously by radiology nursing throughout the procedure under my direct supervision. MEDICATIONS: None FLUOROSCOPY TIME:  24 seconds.  3.7 mGy. PROCEDURE: The procedure, risks, benefits, and alternatives were explained to the patient. Questions regarding the procedure were encouraged and answered. The patient understands and consents to the procedure. A timeout was performed prior to initiating the procedure. Ultrasound was used to check for patency of the internal jugular veins bilaterally. Ultrasound image recording was performed of the left internal jugular vein. Left neck and chest were prepped with chlorhexidine in a sterile fashion, and a sterile drape was applied covering the operative field. Maximum barrier sterile technique with sterile gowns and gloves were used for the procedure. Local anesthesia was provided with 1% lidocaine. After creating a small venotomy incision, a 21 gauge needle was advanced into the left internal jugular vein under direct, real-time ultrasound guidance with permanent image recording. Ultrasound image documentation was performed. After securing guidewire access, a 6 French peel-away sheath was placed. A wire was kinked to measure appropriate catheter length. A 5 French, single-lumen tunneled power line was chosen for placement. This was tunneled in a retrograde fashion from the chest wall to the venotomy incision. The catheter was cut to 25 cm. The catheter was then placed through the sheath and the sheath removed. Final catheter positioning was confirmed and documented with a fluoroscopic spot image. The catheter was aspirated, flushed with saline and flushed with heparinized saline. The venotomy incision was closed with subcuticular 4-0 Vicryl. Dermabond was applied to the incision. The catheter exit site was secured with Prolene retention sutures. COMPLICATIONS: None.  No pneumothorax. 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 Patent right external jugular vein is present. The left internal jugular vein is normally patent. After catheter placement, the tip lies in the SVC. The catheter aspirates normally and is ready for immediate use. IMPRESSION: Placement of tunneled central venous catheter via the left internal jugular vein. The catheter tip lies in the SVC. The catheter is ready for immediate use. 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 Electronically Signed   By: Aletta Edouard M.D.   On: 09/29/2021 16:20   DG Chest Port 1 View  Result Date: 09/22/2021 CLINICAL DATA:  Questionable sepsis - evaluate for abnormality EXAM: PORTABLE CHEST 1 VIEW COMPARISON:  January 31, 2017. FINDINGS: Borderline enlargement of the cardiac silhouette, likely accentuated by AP technique. Both lungs are clear. No visible pleural effusions or pneumothorax. No acute osseous abnormality. IMPRESSION: No evidence of acute cardiopulmonary disease. Electronically Signed   By: Jamesetta So.D.  On: 09/22/2021 13:49   DG Foot Complete Left  Result Date: 09/27/2021 CLINICAL DATA:  Post Op EXAM: LEFT FOOT - COMPLETE 3+ VIEW COMPARISON:  Left foot radiographs 09/22/2021 FINDINGS: Status post amputation of the first digit at the level of the mid/proximal metatarsal. Prior amputation of the fifth digit. No acute osseous finding identified. Small densities within the soft tissues of the medial foot presumably postsurgical. Regional soft tissue swelling. IMPRESSION: Status post amputation of the first digit at the level of the mid/proximal metatarsal. Electronically Signed   By: Audie Pinto M.D.   On: 09/27/2021 10:29   DG Foot Complete Left  Result Date: 09/22/2021 CLINICAL DATA:  Swelling, concern for osteomyelitis EXAM: LEFT FOOT - COMPLETE 3+ VIEW COMPARISON:  Left foot MRI 02/01/2017,  left foot radiograph 01/31/2017 FINDINGS: The patient is status post amputation of most of the fifth toe metatarsal as well as the fifth toe. The remaining portion of the metatarsal is normal in appearance. There is no osseous destruction or erosion in the foot. There is no acute fracture or dislocation. Alignment is normal. The joint spaces are preserved. Vascular calcifications are noted. There is diffuse soft tissue swelling but no soft tissue gas. IMPRESSION: Postsurgical changes in the foot as above. There is no osseous erosion or destruction identified. There is diffuse soft tissue swelling without soft tissue gas. If there is persistent clinical concern for osteomyelitis, MRI may be considered. Electronically Signed   By: Valetta Mole M.D.   On: 09/22/2021 13:49   ECHOCARDIOGRAM COMPLETE  Result Date: 09/23/2021    ECHOCARDIOGRAM REPORT   Patient Name:   Dalbert Garnet Date of Exam: 09/22/2021 Medical Rec #:  [id number]      Height:       78.0 in Accession #:    [id number]     Weight:       230.0 lb Date of Birth:  1971/02/24      BSA:          2.396 m Patient Age:    50 years       BP:           119/55 mmHg Patient Gender: M              HR:           91 bpm. Exam Location:  [facility] Procedure: 2D Echo, Cardiac Doppler and Color Doppler Indications:     R01.1 Murmur  History:         Patient has no prior history of Echocardiogram examinations.                  Risk Factors:Hypertension and Diabetes. HIV Infection. Chronic                  kidney disease. Dyspnea.  Sonographer:     Cresenciano Lick RDCS Referring Phys:  [place] NTIR Diagnosing Phys: Yolonda Kida MD IMPRESSIONS  1. Left ventricular ejection fraction, by estimation, is >75%. The left ventricle has hyperdynamic function. The left ventricle has no regional wall motion abnormalities. There is mild concentric left ventricular hypertrophy. Left ventricular diastolic parameters are consistent with Grade II diastolic dysfunction  (pseudonormalization).  2. Right ventricular systolic function is normal. The right ventricular size is normal.  3. The mitral valve is grossly normal. No evidence of mitral valve regurgitation.  4. The aortic valve is grossly normal. Aortic valve regurgitation is not visualized. FINDINGS  Left Ventricle: Left ventricular ejection fraction, by  estimation, is >75%. The left ventricle has hyperdynamic function. The left ventricle has no regional wall motion abnormalities. The left ventricular internal cavity size was normal in size. There is mild concentric left ventricular hypertrophy. Left ventricular diastolic parameters are consistent with Grade II diastolic dysfunction (pseudonormalization). Right Ventricle: The right ventricular size is normal. No increase in right ventricular wall thickness. Right ventricular systolic function is normal. Left Atrium: Left atrial size was normal in size. Right Atrium: Right atrial size was normal in size. Pericardium: There is no evidence of pericardial effusion. Mitral Valve: The mitral valve is grossly normal. No evidence of mitral valve regurgitation. Tricuspid Valve: The tricuspid valve is grossly normal. Tricuspid valve regurgitation is mild. Aortic Valve: The aortic valve is grossly normal. Aortic valve regurgitation is not visualized. Aortic valve mean gradient measures 7.0 mmHg. Aortic valve peak gradient measures 11.6 mmHg. Aortic valve area, by VTI measures 3.31 cm. Pulmonic Valve: The pulmonic valve was grossly normal. Pulmonic valve regurgitation is not visualized. Aorta: The ascending aorta was not well visualized. IAS/Shunts: No atrial level shunt detected by color flow Doppler.  LEFT VENTRICLE PLAX 2D LVIDd:         4.40 cm   Diastology LVIDs:         2.50 cm   LV e' medial:    7.09 cm/s LV PW:         1.20 cm   LV E/e' medial:  13.9 LV IVS:        1.20 cm   LV e' lateral:   9.82 cm/s LVOT diam:     1.90 cm   LV E/e' lateral: 10.1 LV SV:         81 LV SV Index:    34 LVOT Area:     2.84 cm  RIGHT VENTRICLE RV Basal diam:  3.80 cm RV S prime:     16.60 cm/s TAPSE (M-mode): 1.6 cm LEFT ATRIUM             Index        RIGHT ATRIUM           Index LA diam:        4.10 cm 1.71 cm/m   RA Area:     14.50 cm LA Vol (A2C):   42.9 ml 17.91 ml/m  RA Volume:   37.10 ml  15.49 ml/m LA Vol (A4C):   35.7 ml 14.90 ml/m LA Biplane Vol: 40.2 ml 16.78 ml/m  AORTIC VALVE AV Area (Vmax):    2.62 cm AV Area (Vmean):   2.77 cm AV Area (VTI):     3.31 cm AV Vmax:           170.00 cm/s AV Vmean:          131.000 cm/s AV VTI:            0.246 m AV Peak Grad:      11.6 mmHg AV Mean Grad:      7.0 mmHg LVOT Vmax:         157.00 cm/s LVOT Vmean:        128.000 cm/s LVOT VTI:          0.287 m LVOT/AV VTI ratio: 1.17  AORTA Ao Root diam: 3.40 cm Ao Asc diam:  3.50 cm MITRAL VALVE MV Area (PHT): 2.60 cm    SHUNTS MV Decel Time: 292 msec    Systemic VTI:  0.29 m MV E velocity: 98.80 cm/s  Systemic Diam: 1.90 cm MV  A velocity: 90.70 cm/s MV E/A ratio:  1.09 Yolonda Kida MD Electronically signed by Yolonda Kida MD Signature Date/Time: 09/23/2021/1:17:04 PM    Final    ECHO TEE  Result Date: 09/24/2021    TRANSESOPHOGEAL ECHO REPORT   Patient Name:   JAMAAL BERNASCONI Date of Exam: 09/24/2021 Medical Rec #:  [id number]      Height:       78.0 in Accession #:    [id number]     Weight:       230.0 lb Date of Birth:  07/29/1971      BSA:          2.396 m Patient Age:    45 years       BP:           100/42 mmHg Patient Gender: M              HR:           81 bpm. Exam Location:  [facility] Procedure: Transesophageal Echo, Cardiac Doppler and Color Doppler Indications:     MRSA bacteremia, endocarditis, aorta atheroma  History:         Patient has prior history of Echocardiogram examinations, most                  recent 09/22/2021. Risk Factors:Hypertension and Diabetes. CKD.  Sonographer:     Sherrie Sport Referring Phys:  [place] Diagnosing Phys: Ida Rogue MD PROCEDURE: After  discussion of the risks and benefits of a TEE, an informed consent was obtained from the patient. TEE procedure time was 30 minutes. The transesophogeal probe was passed without difficulty through the esophogus of the patient. Imaged were obtained with the patient in a left lateral decubitus position. Local oropharyngeal anesthetic was provided with Cetacaine and viscous lidocaine. Sedation performed by performing physician. Patients was under conscious sedation during this procedure. Anesthetic administered: 142mg of Fentanyl, 4.059mof Versed. Image quality was excellent. The patient's vital signs; including heart rate, blood pressure, and oxygen saturation; remained stable throughout the procedure. The patient developed no complications during the procedure. IMPRESSIONS  1. No valve endocarditis  2. Left ventricular ejection fraction, by estimation, is 60 to 65%. The left ventricle has normal function. The left ventricle has no regional wall motion abnormalities.  3. Right ventricular systolic function is normal. The right ventricular size is normal.  4. No left atrial/left atrial appendage thrombus was detected.  5. The mitral valve is normal in structure. No evidence of mitral valve regurgitation. No evidence of mitral stenosis.  6. The aortic valve is normal in structure. Aortic valve regurgitation is trivial. Aortic valve sclerosis is present, with no evidence of aortic valve stenosis.  7. There is mild (Grade II) atheroma plaque involving the descending aorta.  8. The inferior vena cava is normal in size with greater than 50% respiratory variability, suggesting right atrial pressure of 3 mmHg.  9. Agitated saline contrast bubble study was negative, with no evidence of any interatrial shunt. Conclusion(s)/Recommendation(s): Normal biventricular function without evidence of hemodynamically significant valvular heart disease. FINDINGS  Left Ventricle: Left ventricular ejection fraction, by estimation, is 60 to  65%. The left ventricle has normal function. The left ventricle has no regional wall motion abnormalities. The left ventricular internal cavity size was normal in size. There is  no left ventricular hypertrophy. Right Ventricle: The right ventricular size is normal. No increase in right ventricular wall thickness. Right ventricular systolic function is  normal. Left Atrium: Left atrial size was normal in size. No left atrial/left atrial appendage thrombus was detected. Right Atrium: Right atrial size was normal in size. Pericardium: There is no evidence of pericardial effusion. Mitral Valve: The mitral valve is normal in structure. No evidence of mitral valve regurgitation. No evidence of mitral valve stenosis. Tricuspid Valve: The tricuspid valve is normal in structure. Tricuspid valve regurgitation is mild . No evidence of tricuspid stenosis. Aortic Valve: The aortic valve is normal in structure. Aortic valve regurgitation is trivial. Aortic valve sclerosis is present, with no evidence of aortic valve stenosis. Pulmonic Valve: The pulmonic valve was normal in structure. Pulmonic valve regurgitation is not visualized. No evidence of pulmonic stenosis. Aorta: The aortic root is normal in size and structure. There is mild (Grade II) atheroma plaque involving the descending aorta. Venous: The inferior vena cava is normal in size with greater than 50% respiratory variability, suggesting right atrial pressure of 3 mmHg. IAS/Shunts: No atrial level shunt detected by color flow Doppler. Agitated saline contrast was given intravenously to evaluate for intracardiac shunting. Agitated saline contrast bubble study was negative, with no evidence of any interatrial shunt. There  is no evidence of a patent foramen ovale. There is no evidence of an atrial septal defect. Ida Rogue MD Electronically signed by Ida Rogue MD Signature Date/Time: 09/24/2021/6:29:20 PM    Final (Updated)    IR TUNNELED CENTRAL VENOUS CATHETER  PLACEMENT  Result Date: 09/29/2021 CLINICAL DATA:  Infection of left foot with osteomyelitis of the first toe requiring amputation. The patient requires a tunneled central venous catheter for home IV antibiotics and has a history of end-stage renal disease and previous right arm AV fistula placement. EXAM: TUNNELED CENTRAL VENOUS CATHETER PLACEMENT WITH ULTRASOUND AND FLUOROSCOPIC GUIDANCE ANESTHESIA/SEDATION: Moderate (conscious) sedation was employed during this procedure. A total of Versed 1.0 mg and Fentanyl 50 mcg was administered intravenously by radiology nursing under my supervision. Moderate Sedation Time: 18 minutes. The patient's level of consciousness and vital signs were monitored continuously by radiology nursing throughout the procedure under my direct supervision. MEDICATIONS: None FLUOROSCOPY TIME:  24 seconds.  3.7 mGy. PROCEDURE: The procedure, risks, benefits, and alternatives were explained to the patient. Questions regarding the procedure were encouraged and answered. The patient understands and consents to the procedure. A timeout was performed prior to initiating the procedure. Ultrasound was used to check for patency of the internal jugular veins bilaterally. Ultrasound image recording was performed of the left internal jugular vein. Left neck and chest were prepped with chlorhexidine in a sterile fashion, and a sterile drape was applied covering the operative field. Maximum barrier sterile technique with sterile gowns and gloves were used for the procedure. Local anesthesia was provided with 1% lidocaine. After creating a small venotomy incision, a 21 gauge needle was advanced into the left internal jugular vein under direct, real-time ultrasound guidance with permanent image recording. Ultrasound image documentation was performed. After securing guidewire access, a 6 French peel-away sheath was placed. A wire was kinked to measure appropriate catheter length. A 5 French, single-lumen  tunneled power line was chosen for placement. This was tunneled in a retrograde fashion from the chest wall to the venotomy incision. The catheter was cut to 25 cm. The catheter was then placed through the sheath and the sheath removed. Final catheter positioning was confirmed and documented with a fluoroscopic spot image. The catheter was aspirated, flushed with saline and flushed with heparinized saline. The venotomy incision was closed with  subcuticular 4-0 Vicryl. Dermabond was applied to the incision. The catheter exit site was secured with Prolene retention sutures. COMPLICATIONS: None.  No pneumothorax. 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 Patent right external jugular vein is present. The left internal jugular vein is normally patent. After catheter placement, the tip lies in the SVC. The catheter aspirates normally and is ready for immediate use. IMPRESSION: Placement of tunneled central venous catheter via the left internal jugular vein. The catheter tip lies in the SVC. The catheter is ready for immediate use. 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 Electronically Signed   By: Aletta Edouard M.D.   On: 09/29/2021 12:42   Results for orders placed or performed during the hospital encounter of 09/22/21  Resp Panel by RT-PCR (Flu A&B, Covid) Nasopharyngeal Swab     Status: None   Collection Time: 09/22/21 11:30 AM   Specimen: Nasopharyngeal Swab; Nasopharyngeal(NP) swabs in vial transport medium  Result Value Ref Range Status   SARS Coronavirus 2 by RT PCR NEGATIVE NEGATIVE Final    Comment: (NOTE) SARS-CoV-2 target nucleic acids are NOT DETECTED.  The SARS-CoV-2 RNA is generally detectable in upper respiratory specimens during the acute phase of infection. The lowest concentration of SARS-CoV-2 viral copies this assay can detect  is 138 copies/mL. A negative result does not preclude SARS-Cov-2 infection and should not be used as the sole basis for treatment or other patient management decisions. A negative result may occur with  improper specimen collection/handling, submission of specimen other than nasopharyngeal swab, presence of viral mutation(s) within the areas targeted by this assay, and inadequate number of viral copies(<138 copies/mL). A negative result must be combined with clinical observations, patient history, and epidemiological information. The expected result is Negative.  Fact Sheet for Patients:  EntrepreneurPulse.com.au  Fact Sheet for Healthcare Providers:  IncredibleEmployment.be  This test is no t yet approved or cleared by the [place] FDA and  has been authorized for detection and/or diagnosis of SARS-CoV-2 by FDA under an Emergency Use Authorization (EUA). This EUA will remain  in effect (meaning this test can be used) for the duration of the COVID-19 declaration under Section 564(b)(1) of the Act, 21 U.S.C.section 360bbb-3(b)(1), unless the authorization is terminated  or revoked sooner.       Influenza A by PCR NEGATIVE NEGATIVE Final   Influenza B by PCR NEGATIVE NEGATIVE Final    Comment: (NOTE) The Xpert Xpress SARS-CoV-2/FLU/RSV plus assay is intended as an aid in the diagnosis of influenza from Nasopharyngeal swab specimens and should not be used as a sole basis for treatment. Nasal washings and aspirates are unacceptable for Xpert Xpress SARS-CoV-2/FLU/RSV testing.  Fact Sheet for Patients: EntrepreneurPulse.com.au  Fact Sheet for Healthcare Providers: IncredibleEmployment.be  This test is not yet approved or cleared by the [place] FDA and has been authorized for detection and/or diagnosis of SARS-CoV-2 by FDA under an Emergency Use Authorization (EUA). This EUA will remain in effect  (meaning this test can be used) for the duration of the COVID-19 declaration under Section 564(b)(1) of the Act, 21 U.S.C. section 360bbb-3(b)(1), unless the authorization is terminated or revoked.  Performed at [facility], [place], [place] [address]   Respiratory (~20 pathogens) panel by PCR     Status: None   Collection Time: 09/22/21 11:30 AM   Specimen: Peritoneal Washings; Respiratory  Result Value Ref Range Status  Adenovirus NOT DETECTED NOT DETECTED Final   Coronavirus 229E NOT DETECTED NOT DETECTED Final    Comment: (NOTE) The Coronavirus on the Respiratory Panel, DOES NOT test for the novel  Coronavirus (2019 nCoV)    Coronavirus HKU1 NOT DETECTED NOT DETECTED Final   Coronavirus NL63 NOT DETECTED NOT DETECTED Final   Coronavirus OC43 NOT DETECTED NOT DETECTED Final   Metapneumovirus NOT DETECTED NOT DETECTED Final   Rhinovirus / Enterovirus NOT DETECTED NOT DETECTED Final   Influenza A NOT DETECTED NOT DETECTED Final   Influenza B NOT DETECTED NOT DETECTED Final   Parainfluenza Virus 1 NOT DETECTED NOT DETECTED Final   Parainfluenza Virus 2 NOT DETECTED NOT DETECTED Final   Parainfluenza Virus 3 NOT DETECTED NOT DETECTED Final   Parainfluenza Virus 4 NOT DETECTED NOT DETECTED Final   Respiratory Syncytial Virus NOT DETECTED NOT DETECTED Final   Bordetella pertussis NOT DETECTED NOT DETECTED Final   Bordetella Parapertussis NOT DETECTED NOT DETECTED Final   Chlamydophila pneumoniae NOT DETECTED NOT DETECTED Final   Mycoplasma pneumoniae NOT DETECTED NOT DETECTED Final    Comment: Performed at [facility], [place] [address] [place], [place] [address]  Blood Culture (routine x 2)     Status: Abnormal   Collection Time: 09/22/21  1:32 PM   Specimen: BLOOD  Result Value Ref Range Status   Specimen Description   Final    BLOOD LEFT ANTECUBITAL Performed at [facility], [address] [place], [place] [address]    Special  Requests   Final    BOTTLES DRAWN AEROBIC AND ANAEROBIC Blood Culture results may not be optimal due to an excessive volume of blood received in culture bottles Performed at [facility], [address], [place], [place] [address]    Culture  Setup Time   Final    GRAM POSITIVE COCCI ANAEROBIC BOTTLE ONLY CRITICAL VALUE NOTED.  VALUE IS CONSISTENT WITH PREVIOUSLY REPORTED AND CALLED VALUE.    Culture (A)  Final    STAPHYLOCOCCUS AUREUS SUSCEPTIBILITIES PERFORMED ON PREVIOUS CULTURE WITHIN THE LAST 5 DAYS. Performed at [facility], [place] [address] [place], [place] [address]    Report Status 09/27/2021 FINAL  Final  Blood Culture (routine x 2)     Status: Abnormal   Collection Time: 09/22/21  1:32 PM   Specimen: BLOOD  Result Value Ref Range Status   Specimen Description   Final    BLOOD LEFT ANTECUBITAL Performed at [facility], [address] [place], [place] [address]    Special Requests   Final    BOTTLES DRAWN AEROBIC AND ANAEROBIC Blood Culture adequate volume Performed at [facility], [address], [place],  [address]    Culture  Setup Time   Final    GRAM POSITIVE COCCI IN BOTH AEROBIC AND ANAEROBIC BOTTLES Organism ID to follow CRITICAL RESULT CALLED TO, READ BACK BY AND VERIFIED WITH: Ardeen Garland, PHARMD AT 0725 ON 09/23/21 BY GM Performed at [facility], [address] [place],  [address]    Culture METHICILLIN RESISTANT STAPHYLOCOCCUS AUREUS (A)  Final   Report Status 09/25/2021 FINAL  Final   Organism ID, Bacteria METHICILLIN RESISTANT STAPHYLOCOCCUS AUREUS  Final      Susceptibility   Methicillin resistant staphylococcus aureus - MIC*    CIPROFLOXACIN 2 INTERMEDIATE Intermediate     ERYTHROMYCIN >=8 RESISTANT Resistant     GENTAMICIN <=0.5 SENSITIVE Sensitive     OXACILLIN >=4 RESISTANT Resistant  TETRACYCLINE <=1 SENSITIVE Sensitive     VANCOMYCIN 1 SENSITIVE Sensitive     TRIMETH/SULFA  <=10 SENSITIVE Sensitive     CLINDAMYCIN <=0.25 SENSITIVE Sensitive     RIFAMPIN <=0.5 SENSITIVE Sensitive     Inducible Clindamycin NEGATIVE Sensitive     * METHICILLIN RESISTANT STAPHYLOCOCCUS AUREUS  Blood Culture ID Panel (Reflexed)     Status: Abnormal   Collection Time: 09/22/21  1:32 PM  Result Value Ref Range Status   Enterococcus faecalis NOT DETECTED NOT DETECTED Final   Enterococcus Faecium NOT DETECTED NOT DETECTED Final   Listeria monocytogenes NOT DETECTED NOT DETECTED Final   Staphylococcus species DETECTED (A) NOT DETECTED Final    Comment: CRITICAL RESULT CALLED TO, READ BACK BY AND VERIFIED WITH: MORGAN HICKS, PHARMD AT 0725 ON 09/23/21 BY GM    Staphylococcus aureus (BCID) DETECTED (A) NOT DETECTED Final    Comment: Methicillin (oxacillin)-resistant Staphylococcus aureus (MRSA). MRSA is predictably resistant to beta-lactam antibiotics (except ceftaroline). Preferred therapy is vancomycin unless clinically contraindicated. Patient requires contact precautions if  hospitalized. CRITICAL RESULT CALLED TO, READ BACK BY AND VERIFIED WITH: MORGAN HICKS, PHARMD AT 0725 ON 09/23/21 BY GM    Staphylococcus epidermidis NOT DETECTED NOT DETECTED Final   Staphylococcus lugdunensis NOT DETECTED NOT DETECTED Final   Streptococcus species NOT DETECTED NOT DETECTED Final   Streptococcus agalactiae NOT DETECTED NOT DETECTED Final   Streptococcus pneumoniae NOT DETECTED NOT DETECTED Final   Streptococcus pyogenes NOT DETECTED NOT DETECTED Final   A.calcoaceticus-baumannii NOT DETECTED NOT DETECTED Final   Bacteroides fragilis NOT DETECTED NOT DETECTED Final   Enterobacterales NOT DETECTED NOT DETECTED Final   Enterobacter cloacae complex NOT DETECTED NOT DETECTED Final   Escherichia coli NOT DETECTED NOT DETECTED Final   Klebsiella aerogenes NOT DETECTED NOT DETECTED Final   Klebsiella oxytoca NOT DETECTED NOT DETECTED Final   Klebsiella pneumoniae NOT DETECTED NOT DETECTED Final    Proteus species NOT DETECTED NOT DETECTED Final   Salmonella species NOT DETECTED NOT DETECTED Final   Serratia marcescens NOT DETECTED NOT DETECTED Final   Haemophilus influenzae NOT DETECTED NOT DETECTED Final   Neisseria meningitidis NOT DETECTED NOT DETECTED Final   Pseudomonas aeruginosa NOT DETECTED NOT DETECTED Final   Stenotrophomonas maltophilia NOT DETECTED NOT DETECTED Final   Candida albicans NOT DETECTED NOT DETECTED Final   Candida auris NOT DETECTED NOT DETECTED Final   Candida glabrata NOT DETECTED NOT DETECTED Final   Candida krusei NOT DETECTED NOT DETECTED Final   Candida parapsilosis NOT DETECTED NOT DETECTED Final   Candida tropicalis NOT DETECTED NOT DETECTED Final   Cryptococcus neoformans/gattii NOT DETECTED NOT DETECTED Final   Meth resistant mecA/C and MREJ DETECTED (A) NOT DETECTED Final    Comment: CRITICAL RESULT CALLED TO, READ BACK BY AND VERIFIED WITH: Ardeen Garland, PHARMD AT 0725 ON 09/23/21 BY GM Performed at [facility], [address] [place], [place] [address]   Group A Strep by PCR [facility] Only)     Status: None   Collection Time: 09/22/21  1:40 PM   Specimen: Throat; Sterile Swab  Result Value Ref Range Status   Group A Strep by PCR NOT DETECTED NOT DETECTED Final    Comment: Performed at [facility], [address] [place], [place] [address]  Peritoneal fluid culture w Gram Stain     Status: None   Collection Time: 09/22/21  7:29 PM   Specimen: Peritoneal Washings; Peritoneal Fluid  Result Value Ref Range Status  Specimen Description   Final    PERITONEAL Performed at [facility], [address] [place], [place] [address]    Special Requests   Final    NONE Performed at [facility], [place], [place] [address]    Gram Stain   Final    FEW WBC PRESENT, PREDOMINANTLY MONONUCLEAR NO ORGANISMS SEEN    Culture   Final    NO GROWTH 3 DAYS Performed at [facility],  [place] [address], [place], [place] [address]    Report Status 09/26/2021 FINAL  Final  Gastrointestinal Panel by PCR , Stool     Status: None   Collection Time: 09/23/21  4:45 AM   Specimen: Stool  Result Value Ref Range Status   Campylobacter species NOT DETECTED NOT DETECTED Final   Plesimonas shigelloides NOT DETECTED NOT DETECTED Final   Salmonella species NOT DETECTED NOT DETECTED Final   Yersinia enterocolitica NOT DETECTED NOT DETECTED Final   Vibrio species NOT DETECTED NOT DETECTED Final   Vibrio cholerae NOT DETECTED NOT DETECTED Final   Enteroaggregative E coli (EAEC) NOT DETECTED NOT DETECTED Final   Enteropathogenic E coli (EPEC) NOT DETECTED NOT DETECTED Final   Enterotoxigenic E coli (ETEC) NOT DETECTED NOT DETECTED Final   Shiga like toxin producing E coli (STEC) NOT DETECTED NOT DETECTED Final   Shigella/Enteroinvasive E coli (EIEC) NOT DETECTED NOT DETECTED Final   Cryptosporidium NOT DETECTED NOT DETECTED Final   Cyclospora cayetanensis NOT DETECTED NOT DETECTED Final   Entamoeba histolytica NOT DETECTED NOT DETECTED Final   Giardia lamblia NOT DETECTED NOT DETECTED Final   Adenovirus F40/41 NOT DETECTED NOT DETECTED Final   Astrovirus NOT DETECTED NOT DETECTED Final   Norovirus GI/GII NOT DETECTED NOT DETECTED Final   Rotavirus A NOT DETECTED NOT DETECTED Final   Sapovirus (I, II, IV, and V) NOT DETECTED NOT DETECTED Final    Comment: Performed at [facility], [address] [place], [place] [address]  C Difficile Quick Screen w PCR reflex     Status: None   Collection Time: 09/23/21  4:45 AM   Specimen: Stool  Result Value Ref Range Status   C Diff antigen NEGATIVE NEGATIVE Final   C Diff toxin NEGATIVE NEGATIVE Final   C Diff interpretation No C. difficile detected.  Final    Comment: Performed at [facility], [address] [place], [place] [address]  CULTURE, BLOOD (ROUTINE X 2) w Reflex to ID Panel     Status: None   Collection Time:  09/25/21  6:12 AM   Specimen: BLOOD  Result Value Ref Range Status   Specimen Description BLOOD LEFT ARM  Final   Special Requests   Final    BOTTLES DRAWN AEROBIC AND ANAEROBIC Blood Culture adequate volume   Culture   Final    NO GROWTH 5 DAYS Performed at [facility], [address] [place], [place] [address]    Report Status 09/30/2021 FINAL  Final  CULTURE, BLOOD (ROUTINE X 2) w Reflex to ID Panel     Status: None   Collection Time: 09/25/21  6:12 AM   Specimen: BLOOD  Result Value Ref Range Status   Specimen Description BLOOD RIGHT HAND  Final   Special Requests   Final    BOTTLES DRAWN AEROBIC AND ANAEROBIC Blood Culture adequate volume   Culture   Final    NO GROWTH 5 DAYS Performed at [facility], [address] [place], [place] [address]    Report Status 09/30/2021 FINAL  Final  Aerobic/Anaerobic Culture w Gram Stain (surgical/deep wound)     Status: None   Collection Time: 09/26/21  2:58 PM   Specimen: Foot; Wound  Result Value Ref Range Status   Specimen Description   Final    FOOT LEFT Performed at [facility], [address] [place], [place] [address]    Special Requests   Final    NONE Performed at [facility], [address] [place], [place] [address]    Gram Stain   Final    NO SQUAMOUS EPITHELIAL CELLS SEEN FEW WBC SEEN FEW GRAM POSITIVE COCCI    Culture   Final    MODERATE METHICILLIN RESISTANT STAPHYLOCOCCUS AUREUS NO ANAEROBES ISOLATED Performed at [facility], [place] [address] [place], [place] [address]    Report Status 10/01/2021 FINAL  Final   Organism ID, Bacteria METHICILLIN RESISTANT STAPHYLOCOCCUS AUREUS  Final      Susceptibility   Methicillin resistant staphylococcus aureus - MIC*    CIPROFLOXACIN 2 INTERMEDIATE Intermediate     ERYTHROMYCIN >=8 RESISTANT Resistant     GENTAMICIN <=0.5 SENSITIVE Sensitive     OXACILLIN >=4 RESISTANT Resistant     TETRACYCLINE <=1 SENSITIVE  Sensitive     VANCOMYCIN 1 SENSITIVE Sensitive     TRIMETH/SULFA <=10 SENSITIVE Sensitive     CLINDAMYCIN <=0.25 SENSITIVE Sensitive     RIFAMPIN <=0.5 SENSITIVE Sensitive     Inducible Clindamycin NEGATIVE Sensitive     * MODERATE METHICILLIN RESISTANT STAPHYLOCOCCUS AUREUS  Surgical PCR screen     Status: Abnormal   Collection Time: 09/27/21  4:43 AM   Specimen: Nasal Mucosa; Nasal Swab  Result Value Ref Range Status   MRSA, PCR POSITIVE (A) NEGATIVE Final    Comment: RESULT CALLED TO, READ BACK BY AND VERIFIED WITH: CHARLOTTE KYEI AT 2671 ON 09/27/21 [place].    Staphylococcus aureus POSITIVE (A) NEGATIVE Final    Comment: (NOTE) The Xpert SA Assay (FDA approved for NASAL specimens in patients 51 years of age and older), is one component of a comprehensive surveillance program. It is not intended to diagnose infection nor to guide or monitor treatment. Performed at [facility], [address] [place], [place] [address]   Aerobic/Anaerobic Culture w Gram Stain (surgical/deep wound)     Status: None   Collection Time: 09/27/21  9:17 AM   Specimen: PATH Digit amputation; Tissue  Result Value Ref Range Status   Specimen Description   Final    WOUND Performed at [facility], [address] [employer] [place], [place] [address]    Special Requests   Final    DIGIT Performed at [facility], [place], [place] [address]    Gram Stain   Final    NO SQUAMOUS EPITHELIAL CELLS SEEN FEW WBC SEEN FEW GRAM POSITIVE COCCI    Culture   Final    FEW METHICILLIN RESISTANT STAPHYLOCOCCUS AUREUS NO ANAEROBES ISOLATED Performed at [facility], [place] [address], [place], [place] [address]    Report Status 10/02/2021 FINAL  Final   Organism ID, Bacteria METHICILLIN RESISTANT STAPHYLOCOCCUS AUREUS  Final      Susceptibility   Methicillin resistant staphylococcus aureus - MIC*    CIPROFLOXACIN 2 INTERMEDIATE Intermediate     ERYTHROMYCIN >=8  RESISTANT Resistant     GENTAMICIN <=0.5 SENSITIVE Sensitive     OXACILLIN >=4 RESISTANT Resistant  TETRACYCLINE <=1 SENSITIVE Sensitive     VANCOMYCIN <=0.5 SENSITIVE Sensitive     TRIMETH/SULFA <=10 SENSITIVE Sensitive     CLINDAMYCIN <=0.25 SENSITIVE Sensitive     RIFAMPIN <=0.5 SENSITIVE Sensitive     Inducible Clindamycin NEGATIVE Sensitive     * FEW METHICILLIN RESISTANT STAPHYLOCOCCUS AUREUS    Signed:  Dwyane Dee MD.  [facility] 10/05/2021, 12:09 PM

## 2021-10-05 NOTE — TOC Transition Note (Signed)
Transition of Care [facility]) - CM/SW Discharge Note   Patient Details  Name: Nathan Ayers MRN: [id number] Date of Birth: 08/08/1971  [facility] CM/SW Contact:  Nathan Sessions, RN Phone Number: 10/05/2021, 1:34 PM   Clinical Narrative:      Patient to discharge today Mother at bedside  Mother and father to transport to their home and Nathan Ayers Father has already taken PD supplies to the home  [facility] and [facility] delivered to bedside with adapt  Wound care to be bulky fry dressing at discharge.  Patient and mother will be responsible for changing at home Nathan Ayers dialysis liaison notified of discharge Confirmed with [facility] at [facility] that medication will be delivered  to the home in [place] today    Patient will not have additional home health services other than [facility] with infusion  Patient to have schedule follow up with podiatry and ID on Dec 13TH  Final next level of care: Home/Self Care Barriers to Discharge: Barriers Resolved   Patient Goals and CMS Choice        Discharge Placement                       Discharge Plan and Services                                     Social Determinants of Health (SDOH) Interventions     Readmission Risk Interventions No flowsheet data found.

## 2021-10-05 NOTE — Care Management Important Message (Signed)
Important Message  Patient Details  Name: Nathan Ayers MRN: [id number] Date of Birth: 14-Dec-1970   Medicare Important Message Given:  Yes  Reviewed Medicare IM with patient via phone due to isolation status.  Copy of Medicare IM mailed to patient's home address on file per request.    Dannette Barbara 10/05/2021, 2:34 PM

## 2021-10-05 NOTE — Progress Notes (Signed)
Patient disconnected from peritoneal dialysis cycler without complications.Total uf=2292m removed.Treatment tolerated well per patient. Effluent was clear and yellow,no fibrin noted.

## 2021-10-05 NOTE — Progress Notes (Signed)
[facility]  ROUNDING NOTE   Subjective:   Patient seen sitting up in bed, alert and oriented Tolerating meals appropriately Denies shortness of breath Peritoneal dialysis completed overnight, tolerated treatment well   Objective:  Vital signs in last 24 hours:  Temp:  [97.6 F (36.4 C)-98.8 F (37.1 C)] 97.6 F (36.4 C) (11/28 1105) Pulse Rate:  [72-85] 73 (11/28 1105) Resp:  [16-18] 18 (11/28 1105) BP: (111-145)/(51-76) 135/66 (11/28 1150) SpO2:  [99 %-100 %] 100 % (11/28 1105)  Weight change:  Filed Weights   10/01/21 0500 10/02/21 0500 10/03/21 0500  Weight: 102.8 kg 101.5 kg 101.2 kg    Intake/Output: No intake/output data recorded.   Intake/Output this shift:  Total I/O In: 12500 [Other:12500] Out: 14701 [Other:14701]  Physical Exam: General: NAD, sitting up in bed  Head: Normocephalic, atraumatic. Moist oral mucosal membranes  Lungs:  Clear bilaterally, normal effort  Heart: Regular rate and rhythm  Abdomen:  Soft, nontender, PD catheter  Extremities: No peripheral edema. Right foot dressings clean, dry and intact  Neurologic: Nonfocal, moving all four extremities  Skin: No lesions  Access: PD catheter, right radiocephalic AVF    Basic Metabolic Panel: Recent Labs  Lab 10/01/21 0843 10/02/21 0416 10/03/21 0612 10/04/21 0527 10/05/21 0445  NA 131* 132* 133* 132* 129*  K 5.0 4.3 4.3 4.3 4.3  CL 95* 96* 96* 95* 93*  CO2 23 24 23 24 23   GLUCOSE 106* 169* 173* 164* 170*  BUN 74* 73* 67* 63* 65*  CREATININE 18.40* 16.06* 15.21* 14.72* 14.60*  CALCIUM 8.5* 8.4* 8.6* 8.8* 8.6*  MG 2.4 2.3 2.3 2.3 2.3     Liver Function Tests: No results for input(s): AST, ALT, ALKPHOS, BILITOT, PROT, ALBUMIN in the last 168 hours.  No results for input(s): LIPASE, AMYLASE in the last 168 hours. No results for input(s): AMMONIA in the last 168 hours.  CBC: Recent Labs  Lab 10/01/21 0843 10/02/21 0416 10/03/21 0612 10/04/21 0527 10/05/21 0445   WBC 9.2 8.3 8.4 9.4 9.1  NEUTROABS 5.8 5.2 5.1 5.9 5.3  HGB 8.6* 8.3* 7.8* 8.2* 7.6*  HCT 24.7* 24.4* 23.2* 23.7* 21.8*  MCV 84.0 83.8 84.1 84.9 83.8  PLT 290 279 291 343 297     Cardiac Enzymes: Recent Labs  Lab 10/05/21 0445  CKTOTAL 208     BNP: Invalid input(s): POCBNP  CBG: Recent Labs  Lab 10/04/21 1153 10/04/21 1608 10/04/21 2046 10/05/21 0754 10/05/21 1113  GLUCAP 155* 136* 180* 186* 129*     Microbiology: Results for orders placed or performed during the hospital encounter of 09/22/21  Resp Panel by RT-PCR (Flu A&B, Covid) Nasopharyngeal Swab     Status: None   Collection Time: 09/22/21 11:30 AM   Specimen: Nasopharyngeal Swab; Nasopharyngeal(NP) swabs in vial transport medium  Result Value Ref Range Status   SARS Coronavirus 2 by RT PCR NEGATIVE NEGATIVE Final    Comment: (NOTE) SARS-CoV-2 target nucleic acids are NOT DETECTED.  The SARS-CoV-2 RNA is generally detectable in upper respiratory specimens during the acute phase of infection. The lowest concentration of SARS-CoV-2 viral copies this assay can detect is 138 copies/mL. A negative result does not preclude SARS-Cov-2 infection and should not be used as the sole basis for treatment or other patient management decisions. A negative result may occur with  improper specimen collection/handling, submission of specimen other than nasopharyngeal swab, presence of viral mutation(s) within the areas targeted by this assay, and inadequate number of viral copies(<138 copies/mL).  A negative result must be combined with clinical observations, patient history, and epidemiological information. The expected result is Negative.  Fact Sheet for Patients:  EntrepreneurPulse.com.au  Fact Sheet for Healthcare Providers:  IncredibleEmployment.be  This test is no t yet approved or cleared by the [place] FDA and  has been authorized for detection and/or diagnosis of  SARS-CoV-2 by FDA under an Emergency Use Authorization (EUA). This EUA will remain  in effect (meaning this test can be used) for the duration of the COVID-19 declaration under Section 564(b)(1) of the Act, 21 U.S.C.section 360bbb-3(b)(1), unless the authorization is terminated  or revoked sooner.       Influenza A by PCR NEGATIVE NEGATIVE Final   Influenza B by PCR NEGATIVE NEGATIVE Final    Comment: (NOTE) The Xpert Xpress SARS-CoV-2/FLU/RSV plus assay is intended as an aid in the diagnosis of influenza from Nasopharyngeal swab specimens and should not be used as a sole basis for treatment. Nasal washings and aspirates are unacceptable for Xpert Xpress SARS-CoV-2/FLU/RSV testing.  Fact Sheet for Patients: EntrepreneurPulse.com.au  Fact Sheet for Healthcare Providers: IncredibleEmployment.be  This test is not yet approved or cleared by the [place] FDA and has been authorized for detection and/or diagnosis of SARS-CoV-2 by FDA under an Emergency Use Authorization (EUA). This EUA will remain in effect (meaning this test can be used) for the duration of the COVID-19 declaration under Section 564(b)(1) of the Act, 21 U.S.C. section 360bbb-3(b)(1), unless the authorization is terminated or revoked.  Performed at [facility], [place], [place] [address]   Respiratory (~20 pathogens) panel by PCR     Status: None   Collection Time: 09/22/21 11:30 AM   Specimen: Peritoneal Washings; Respiratory  Result Value Ref Range Status   Adenovirus NOT DETECTED NOT DETECTED Final   Coronavirus 229E NOT DETECTED NOT DETECTED Final    Comment: (NOTE) The Coronavirus on the Respiratory Panel, DOES NOT test for the novel  Coronavirus (2019 nCoV)    Coronavirus HKU1 NOT DETECTED NOT DETECTED Final   Coronavirus NL63 NOT DETECTED NOT DETECTED Final   Coronavirus OC43 NOT DETECTED NOT DETECTED Final   Metapneumovirus NOT  DETECTED NOT DETECTED Final   Rhinovirus / Enterovirus NOT DETECTED NOT DETECTED Final   Influenza A NOT DETECTED NOT DETECTED Final   Influenza B NOT DETECTED NOT DETECTED Final   Parainfluenza Virus 1 NOT DETECTED NOT DETECTED Final   Parainfluenza Virus 2 NOT DETECTED NOT DETECTED Final   Parainfluenza Virus 3 NOT DETECTED NOT DETECTED Final   Parainfluenza Virus 4 NOT DETECTED NOT DETECTED Final   Respiratory Syncytial Virus NOT DETECTED NOT DETECTED Final   Bordetella pertussis NOT DETECTED NOT DETECTED Final   Bordetella Parapertussis NOT DETECTED NOT DETECTED Final   Chlamydophila pneumoniae NOT DETECTED NOT DETECTED Final   Mycoplasma pneumoniae NOT DETECTED NOT DETECTED Final    Comment: Performed at [facility], [place]. [address], [place], [place] [address]  Blood Culture (routine x 2)     Status: Abnormal   Collection Time: 09/22/21  1:32 PM   Specimen: BLOOD  Result Value Ref Range Status   Specimen Description   Final    BLOOD LEFT ANTECUBITAL Performed at [facility], [address] [place], [place] [address]    Special Requests   Final    BOTTLES DRAWN AEROBIC AND ANAEROBIC Blood Culture results may not be optimal due to an excessive volume of blood received in culture bottles Performed at  [facility], [address] [place], [place] [address]    Culture  Setup Time   Final    GRAM POSITIVE COCCI ANAEROBIC BOTTLE ONLY CRITICAL VALUE NOTED.  VALUE IS CONSISTENT WITH PREVIOUSLY REPORTED AND CALLED VALUE.    Culture (A)  Final    STAPHYLOCOCCUS AUREUS SUSCEPTIBILITIES PERFORMED ON PREVIOUS CULTURE WITHIN THE LAST 5 DAYS. Performed at [facility], [place] [address] [place], [place] [address]    Report Status 09/27/2021 FINAL  Final  Blood Culture (routine x 2)     Status: Abnormal   Collection Time: 09/22/21  1:32 PM   Specimen: BLOOD  Result Value Ref Range Status   Specimen Description   Final    BLOOD LEFT ANTECUBITAL Performed  at [facility], [address], [place], [place] [address]    Special Requests   Final    BOTTLES DRAWN AEROBIC AND ANAEROBIC Blood Culture adequate volume Performed at [facility], [address] [place], [place] [address]    Culture  Setup Time   Final    GRAM POSITIVE COCCI IN BOTH AEROBIC AND ANAEROBIC BOTTLES Organism ID to follow CRITICAL RESULT CALLED TO, READ BACK BY AND VERIFIED WITH: Ardeen Garland, PHARMD AT 0725 ON 09/23/21 BY GM Performed at [facility], [address] [place], [place] [address]    Culture METHICILLIN RESISTANT STAPHYLOCOCCUS AUREUS (A)  Final   Report Status 09/25/2021 FINAL  Final   Organism ID, Bacteria METHICILLIN RESISTANT STAPHYLOCOCCUS AUREUS  Final      Susceptibility   Methicillin resistant staphylococcus aureus - MIC*    CIPROFLOXACIN 2 INTERMEDIATE Intermediate     ERYTHROMYCIN >=8 RESISTANT Resistant     GENTAMICIN <=0.5 SENSITIVE Sensitive     OXACILLIN >=4 RESISTANT Resistant     TETRACYCLINE <=1 SENSITIVE Sensitive     VANCOMYCIN 1 SENSITIVE Sensitive     TRIMETH/SULFA <=10 SENSITIVE Sensitive     CLINDAMYCIN <=0.25 SENSITIVE Sensitive     RIFAMPIN <=0.5 SENSITIVE Sensitive     Inducible Clindamycin NEGATIVE Sensitive     * METHICILLIN RESISTANT STAPHYLOCOCCUS AUREUS  Blood Culture ID Panel (Reflexed)     Status: Abnormal   Collection Time: 09/22/21  1:32 PM  Result Value Ref Range Status   Enterococcus faecalis NOT DETECTED NOT DETECTED Final   Enterococcus Faecium NOT DETECTED NOT DETECTED Final   Listeria monocytogenes NOT DETECTED NOT DETECTED Final   Staphylococcus species DETECTED (A) NOT DETECTED Final    Comment: CRITICAL RESULT CALLED TO, READ BACK BY AND VERIFIED WITH: MORGAN HICKS, PHARMD AT 0725 ON 09/23/21 BY GM    Staphylococcus aureus (BCID) DETECTED (A) NOT DETECTED Final    Comment: Methicillin (oxacillin)-resistant Staphylococcus aureus (MRSA). MRSA is predictably resistant to  beta-lactam antibiotics (except ceftaroline). Preferred therapy is vancomycin unless clinically contraindicated. Patient requires contact precautions if  hospitalized. CRITICAL RESULT CALLED TO, READ BACK BY AND VERIFIED WITH: MORGAN HICKS, PHARMD AT 0725 ON 09/23/21 BY GM    Staphylococcus epidermidis NOT DETECTED NOT DETECTED Final   Staphylococcus lugdunensis NOT DETECTED NOT DETECTED Final   Streptococcus species NOT DETECTED NOT DETECTED Final   Streptococcus agalactiae NOT DETECTED NOT DETECTED Final   Streptococcus pneumoniae NOT DETECTED NOT DETECTED Final   Streptococcus pyogenes NOT DETECTED NOT DETECTED Final   A.calcoaceticus-baumannii NOT DETECTED NOT DETECTED Final   Bacteroides fragilis NOT DETECTED NOT DETECTED Final   Enterobacterales NOT DETECTED NOT DETECTED Final   Enterobacter cloacae complex NOT DETECTED NOT DETECTED  Final   Escherichia coli NOT DETECTED NOT DETECTED Final   Klebsiella aerogenes NOT DETECTED NOT DETECTED Final   Klebsiella oxytoca NOT DETECTED NOT DETECTED Final   Klebsiella pneumoniae NOT DETECTED NOT DETECTED Final   Proteus species NOT DETECTED NOT DETECTED Final   Salmonella species NOT DETECTED NOT DETECTED Final   Serratia marcescens NOT DETECTED NOT DETECTED Final   Haemophilus influenzae NOT DETECTED NOT DETECTED Final   Neisseria meningitidis NOT DETECTED NOT DETECTED Final   Pseudomonas aeruginosa NOT DETECTED NOT DETECTED Final   Stenotrophomonas maltophilia NOT DETECTED NOT DETECTED Final   Candida albicans NOT DETECTED NOT DETECTED Final   Candida auris NOT DETECTED NOT DETECTED Final   Candida glabrata NOT DETECTED NOT DETECTED Final   Candida krusei NOT DETECTED NOT DETECTED Final   Candida parapsilosis NOT DETECTED NOT DETECTED Final   Candida tropicalis NOT DETECTED NOT DETECTED Final   Cryptococcus neoformans/gattii NOT DETECTED NOT DETECTED Final   Meth resistant mecA/C and MREJ DETECTED (A) NOT DETECTED Final    Comment:  CRITICAL RESULT CALLED TO, READ BACK BY AND VERIFIED WITH: Ardeen Garland, PHARMD AT 0725 ON 09/23/21 BY GM Performed at [facility], [place], [place] [address]   Group A Strep by PCR [facility] Only)     Status: None   Collection Time: 09/22/21  1:40 PM   Specimen: Throat; Sterile Swab  Result Value Ref Range Status   Group A Strep by PCR NOT DETECTED NOT DETECTED Final    Comment: Performed at [facility], [address] [place], [place] [address]  Peritoneal fluid culture w Gram Stain     Status: None   Collection Time: 09/22/21  7:29 PM   Specimen: Peritoneal Washings; Peritoneal Fluid  Result Value Ref Range Status   Specimen Description   Final    PERITONEAL Performed at [facility], [address] [place], [place] [address]    Special Requests   Final    NONE Performed at [facility], [address] [place], [place] [address]    Gram Stain   Final    FEW WBC PRESENT, PREDOMINANTLY MONONUCLEAR NO ORGANISMS SEEN    Culture   Final    NO GROWTH 3 DAYS Performed at [facility], [place] [address] [place], [place] [address]    Report Status 09/26/2021 FINAL  Final  Gastrointestinal Panel by PCR , Stool     Status: None   Collection Time: 09/23/21  4:45 AM   Specimen: Stool  Result Value Ref Range Status   Campylobacter species NOT DETECTED NOT DETECTED Final   Plesimonas shigelloides NOT DETECTED NOT DETECTED Final   Salmonella species NOT DETECTED NOT DETECTED Final   Yersinia enterocolitica NOT DETECTED NOT DETECTED Final   Vibrio species NOT DETECTED NOT DETECTED Final   Vibrio cholerae NOT DETECTED NOT DETECTED Final   Enteroaggregative E coli (EAEC) NOT DETECTED NOT DETECTED Final   Enteropathogenic E coli (EPEC) NOT DETECTED NOT DETECTED Final   Enterotoxigenic E coli (ETEC) NOT DETECTED NOT DETECTED Final   Shiga like toxin producing E coli (STEC) NOT DETECTED NOT DETECTED Final   Shigella/Enteroinvasive  E coli (EIEC) NOT DETECTED NOT DETECTED Final   Cryptosporidium NOT DETECTED NOT DETECTED Final   Cyclospora cayetanensis NOT DETECTED NOT DETECTED Final   Entamoeba histolytica NOT DETECTED NOT DETECTED Final   Giardia lamblia NOT DETECTED NOT DETECTED Final   Adenovirus F40/41 NOT DETECTED NOT DETECTED Final  Astrovirus NOT DETECTED NOT DETECTED Final   Norovirus GI/GII NOT DETECTED NOT DETECTED Final   Rotavirus A NOT DETECTED NOT DETECTED Final   Sapovirus (I, II, IV, and V) NOT DETECTED NOT DETECTED Final    Comment: Performed at [facility], [place], [place] [address]  C Difficile Quick Screen w PCR reflex     Status: None   Collection Time: 09/23/21  4:45 AM   Specimen: Stool  Result Value Ref Range Status   C Diff antigen NEGATIVE NEGATIVE Final   C Diff toxin NEGATIVE NEGATIVE Final   C Diff interpretation No C. difficile detected.  Final    Comment: Performed at [facility], [address] [place], [place] [address]  CULTURE, BLOOD (ROUTINE X 2) w Reflex to ID Panel     Status: None   Collection Time: 09/25/21  6:12 AM   Specimen: BLOOD  Result Value Ref Range Status   Specimen Description BLOOD LEFT ARM  Final   Special Requests   Final    BOTTLES DRAWN AEROBIC AND ANAEROBIC Blood Culture adequate volume   Culture   Final    NO GROWTH 5 DAYS Performed at [facility], [address] [place], [place] [address]    Report Status 09/30/2021 FINAL  Final  CULTURE, BLOOD (ROUTINE X 2) w Reflex to ID Panel     Status: None   Collection Time: 09/25/21  6:12 AM   Specimen: BLOOD  Result Value Ref Range Status   Specimen Description BLOOD RIGHT HAND  Final   Special Requests   Final    BOTTLES DRAWN AEROBIC AND ANAEROBIC Blood Culture adequate volume   Culture   Final    NO GROWTH 5 DAYS Performed at [facility], [address], [place], [place] [address]    Report Status 09/30/2021 FINAL  Final   Aerobic/Anaerobic Culture w Gram Stain (surgical/deep wound)     Status: None   Collection Time: 09/26/21  2:58 PM   Specimen: Foot; Wound  Result Value Ref Range Status   Specimen Description   Final    FOOT LEFT Performed at [facility], [address] [place], [place] [address]    Special Requests   Final    NONE Performed at [facility], [address] [place], [place] [address]    Gram Stain   Final    NO SQUAMOUS EPITHELIAL CELLS SEEN FEW WBC SEEN FEW GRAM POSITIVE COCCI    Culture   Final    MODERATE METHICILLIN RESISTANT STAPHYLOCOCCUS AUREUS NO ANAEROBES ISOLATED Performed at [facility], [place] [address] [place], [place] [address]    Report Status 10/01/2021 FINAL  Final   Organism ID, Bacteria METHICILLIN RESISTANT STAPHYLOCOCCUS AUREUS  Final      Susceptibility   Methicillin resistant staphylococcus aureus - MIC*    CIPROFLOXACIN 2 INTERMEDIATE Intermediate     ERYTHROMYCIN >=8 RESISTANT Resistant     GENTAMICIN <=0.5 SENSITIVE Sensitive     OXACILLIN >=4 RESISTANT Resistant     TETRACYCLINE <=1 SENSITIVE Sensitive     VANCOMYCIN 1 SENSITIVE Sensitive     TRIMETH/SULFA <=10 SENSITIVE Sensitive     CLINDAMYCIN <=0.25 SENSITIVE Sensitive     RIFAMPIN <=0.5 SENSITIVE Sensitive     Inducible Clindamycin NEGATIVE Sensitive     * MODERATE METHICILLIN RESISTANT STAPHYLOCOCCUS AUREUS  Surgical PCR screen     Status: Abnormal   Collection Time: 09/27/21  4:43 AM  Specimen: Nasal Mucosa; Nasal Swab  Result Value Ref Range Status   MRSA, PCR POSITIVE (A) NEGATIVE Final    Comment: RESULT CALLED TO, READ BACK BY AND VERIFIED WITH: CHARLOTTE KYEI AT 3419 ON 09/27/21 [place].    Staphylococcus aureus POSITIVE (A) NEGATIVE Final    Comment: (NOTE) The Xpert SA Assay (FDA approved for NASAL specimens in patients 46 years of age and older), is one component of a comprehensive surveillance program. It is not intended to diagnose infection nor  to guide or monitor treatment. Performed at [facility], [address] [place], [place] [address]   Aerobic/Anaerobic Culture w Gram Stain (surgical/deep wound)     Status: None   Collection Time: 09/27/21  9:17 AM   Specimen: PATH Digit amputation; Tissue  Result Value Ref Range Status   Specimen Description   Final    WOUND Performed at [facility], [address] [place], [place] [address]    Special Requests   Final    DIGIT Performed at [facility], [place], [place] [address]    Gram Stain   Final    NO SQUAMOUS EPITHELIAL CELLS SEEN FEW WBC SEEN FEW GRAM POSITIVE COCCI    Culture   Final    FEW METHICILLIN RESISTANT STAPHYLOCOCCUS AUREUS NO ANAEROBES ISOLATED Performed at [facility], [place] [address], [place], [place] [address]    Report Status 10/02/2021 FINAL  Final   Organism ID, Bacteria METHICILLIN RESISTANT STAPHYLOCOCCUS AUREUS  Final      Susceptibility   Methicillin resistant staphylococcus aureus - MIC*    CIPROFLOXACIN 2 INTERMEDIATE Intermediate     ERYTHROMYCIN >=8 RESISTANT Resistant     GENTAMICIN <=0.5 SENSITIVE Sensitive     OXACILLIN >=4 RESISTANT Resistant     TETRACYCLINE <=1 SENSITIVE Sensitive     VANCOMYCIN <=0.5 SENSITIVE Sensitive     TRIMETH/SULFA <=10 SENSITIVE Sensitive     CLINDAMYCIN <=0.25 SENSITIVE Sensitive     RIFAMPIN <=0.5 SENSITIVE Sensitive     Inducible Clindamycin NEGATIVE Sensitive     * FEW METHICILLIN RESISTANT STAPHYLOCOCCUS AUREUS    Coagulation Studies: No results for input(s): LABPROT, INR in the last 72 hours.   Urinalysis: No results for input(s): COLORURINE, LABSPEC, PHURINE, GLUCOSEU, HGBUR, BILIRUBINUR, KETONESUR, PROTEINUR, UROBILINOGEN, NITRITE, LEUKOCYTESUR in the last 72 hours.  Invalid input(s): APPERANCEUR    Imaging: No results found.   Medications:    sodium chloride 250 mL (10/04/21 1240)   DAPTOmycin (CUBICIN)  IV 800 mg (10/04/21  1244)   dialysis solution 1.5% low-MG/low-CA     dialysis solution 2.5% low-MG/low-CA      sodium chloride   Intravenous Once   abacavir  300 mg Oral BID   amLODipine  10 mg Oral Daily   carvedilol  25 mg Oral BID   Chlorhexidine Gluconate Cloth  6 each Topical Daily   dolutegravir  50 mg Oral QHS   gentamicin cream  1 application Topical Daily   heparin  5,000 Units Subcutaneous Q8H   insulin aspart  0-6 Units Subcutaneous TID WC   [employer] Standard 1.0  325 mL Oral BID   lamiVUDine  25 mg Oral QHS   losartan  100 mg Oral QPM   minoxidil  2.5 mg Oral BID   multivitamin  1 tablet Oral Daily   sodium chloride flush  3 mL Intravenous Q12H   sodium chloride, acetaminophen, heparin, hydrALAZINE, labetalol, loperamide, oxyCODONE, sodium chloride flush  Assessment/ Plan:  [name] is a 50 y.o. black male with end stage renal disease on peritoneal dialysis, HIV on HART, hypertension, diabetes mellitus type II, diabetic neuropathy who is admitted to [facility] on 09/22/2021 on Leukocytosis [D72.829] Uremia [N19] Generalized abdominal pain [R10.84] ESRD on peritoneal dialysis ([place]) [N18.6, Z99.2] Acute sepsis ([place]) [A41.9]  [facility] 102.5kg CCPD 9 hours 5 exchanges 2.7 liter fills   End stage renal disease: no indication that patient has peritonitis.  - Continue peritoneal dialysis nightly during this admission.  -Treatment received overnight, documented UF of 2 L.    Hypertension:  -Managed with amlodipine, carvedilol, Cozaar and minoxidil -BP 135/66  Anemia with chronic kidney disease:  Lab Results  Component Value Date   HGB 7.6 (L) 10/05/2021    EPO 20,000 units weekly subQ.  Diabetes mellitus type II with chronic kidney disease: continue glucose control. Hemoglobin A1c of 6.2%.  Managed by primary team  Hypokalemia -   Lab Results  Component Value Date   K 4.3 10/05/2021   Resolved   Left foot osteomyelitis with Sepsis with  bacteremia and fever: MRSA in blood cultures on 09/22/21. Status post amputation on 11/20 with I&D on 09/30/2021 - IV daptomycin x 6 weeks. Recommended dose 800 mg iv every 48 hrs.End date 11/01/2021 Lt PICC placed on 09/29/21 for home antibiotics.   - appreciate podiatry, ID and cardiology input.   -Plan to teach patient to perform dressing changes at home    LOS: [place] 11/28/202212:43 PM

## 2021-10-06 DIAGNOSIS — D631 Anemia in chronic kidney disease: Secondary | ICD-10-CM | POA: Diagnosis not present

## 2021-10-06 DIAGNOSIS — Z79899 Other long term (current) drug therapy: Secondary | ICD-10-CM | POA: Diagnosis not present

## 2021-10-06 DIAGNOSIS — D509 Iron deficiency anemia, unspecified: Secondary | ICD-10-CM | POA: Diagnosis not present

## 2021-10-06 DIAGNOSIS — Z992 Dependence on renal dialysis: Secondary | ICD-10-CM | POA: Diagnosis not present

## 2021-10-06 DIAGNOSIS — N186 End stage renal disease: Secondary | ICD-10-CM | POA: Diagnosis not present

## 2021-10-06 DIAGNOSIS — R17 Unspecified jaundice: Secondary | ICD-10-CM | POA: Diagnosis not present

## 2021-10-06 DIAGNOSIS — N2581 Secondary hyperparathyroidism of renal origin: Secondary | ICD-10-CM | POA: Diagnosis not present

## 2021-10-06 DIAGNOSIS — E440 Moderate protein-calorie malnutrition: Secondary | ICD-10-CM | POA: Diagnosis not present

## 2021-10-06 DIAGNOSIS — N2589 Other disorders resulting from impaired renal tubular function: Secondary | ICD-10-CM | POA: Diagnosis not present

## 2021-10-07 DIAGNOSIS — N2589 Other disorders resulting from impaired renal tubular function: Secondary | ICD-10-CM | POA: Diagnosis not present

## 2021-10-07 DIAGNOSIS — R7881 Bacteremia: Secondary | ICD-10-CM | POA: Diagnosis not present

## 2021-10-07 DIAGNOSIS — N2581 Secondary hyperparathyroidism of renal origin: Secondary | ICD-10-CM | POA: Diagnosis not present

## 2021-10-07 DIAGNOSIS — B9562 Methicillin resistant Staphylococcus aureus infection as the cause of diseases classified elsewhere: Secondary | ICD-10-CM | POA: Diagnosis not present

## 2021-10-07 DIAGNOSIS — R17 Unspecified jaundice: Secondary | ICD-10-CM | POA: Diagnosis not present

## 2021-10-07 DIAGNOSIS — Z79899 Other long term (current) drug therapy: Secondary | ICD-10-CM | POA: Diagnosis not present

## 2021-10-07 DIAGNOSIS — D631 Anemia in chronic kidney disease: Secondary | ICD-10-CM | POA: Diagnosis not present

## 2021-10-07 DIAGNOSIS — E1122 Type 2 diabetes mellitus with diabetic chronic kidney disease: Secondary | ICD-10-CM | POA: Diagnosis not present

## 2021-10-07 DIAGNOSIS — N186 End stage renal disease: Secondary | ICD-10-CM | POA: Diagnosis not present

## 2021-10-07 DIAGNOSIS — D509 Iron deficiency anemia, unspecified: Secondary | ICD-10-CM | POA: Diagnosis not present

## 2021-10-07 DIAGNOSIS — Z992 Dependence on renal dialysis: Secondary | ICD-10-CM | POA: Diagnosis not present

## 2021-10-07 DIAGNOSIS — E440 Moderate protein-calorie malnutrition: Secondary | ICD-10-CM | POA: Diagnosis not present

## 2021-10-08 ENCOUNTER — Telehealth: Payer: Self-pay | Admitting: Infectious Diseases

## 2021-10-08 DIAGNOSIS — D509 Iron deficiency anemia, unspecified: Secondary | ICD-10-CM | POA: Diagnosis not present

## 2021-10-08 DIAGNOSIS — N2581 Secondary hyperparathyroidism of renal origin: Secondary | ICD-10-CM | POA: Diagnosis not present

## 2021-10-08 DIAGNOSIS — N186 End stage renal disease: Secondary | ICD-10-CM | POA: Diagnosis not present

## 2021-10-08 DIAGNOSIS — D631 Anemia in chronic kidney disease: Secondary | ICD-10-CM | POA: Diagnosis not present

## 2021-10-08 DIAGNOSIS — N2589 Other disorders resulting from impaired renal tubular function: Secondary | ICD-10-CM | POA: Diagnosis not present

## 2021-10-08 DIAGNOSIS — E1122 Type 2 diabetes mellitus with diabetic chronic kidney disease: Secondary | ICD-10-CM | POA: Diagnosis not present

## 2021-10-08 DIAGNOSIS — Z992 Dependence on renal dialysis: Secondary | ICD-10-CM | POA: Diagnosis not present

## 2021-10-09 ENCOUNTER — Telehealth: Payer: Self-pay | Admitting: Infectious Diseases

## 2021-10-09 DIAGNOSIS — N186 End stage renal disease: Secondary | ICD-10-CM | POA: Diagnosis not present

## 2021-10-09 DIAGNOSIS — N2589 Other disorders resulting from impaired renal tubular function: Secondary | ICD-10-CM | POA: Diagnosis not present

## 2021-10-09 DIAGNOSIS — Z992 Dependence on renal dialysis: Secondary | ICD-10-CM | POA: Diagnosis not present

## 2021-10-09 DIAGNOSIS — N2581 Secondary hyperparathyroidism of renal origin: Secondary | ICD-10-CM | POA: Diagnosis not present

## 2021-10-09 DIAGNOSIS — E1122 Type 2 diabetes mellitus with diabetic chronic kidney disease: Secondary | ICD-10-CM | POA: Diagnosis not present

## 2021-10-09 DIAGNOSIS — D631 Anemia in chronic kidney disease: Secondary | ICD-10-CM | POA: Diagnosis not present

## 2021-10-09 DIAGNOSIS — D509 Iron deficiency anemia, unspecified: Secondary | ICD-10-CM | POA: Diagnosis not present

## 2021-10-09 NOTE — Telephone Encounter (Signed)
Called patient , could not reach him, left a voice message to say that his CPK is 558 and he is on daptomycin- Asked him to hold dapto.  Will send a prescription for linezolid PO-

## 2021-10-10 DIAGNOSIS — Z992 Dependence on renal dialysis: Secondary | ICD-10-CM | POA: Diagnosis not present

## 2021-10-10 DIAGNOSIS — D631 Anemia in chronic kidney disease: Secondary | ICD-10-CM | POA: Diagnosis not present

## 2021-10-10 DIAGNOSIS — N2589 Other disorders resulting from impaired renal tubular function: Secondary | ICD-10-CM | POA: Diagnosis not present

## 2021-10-10 DIAGNOSIS — N186 End stage renal disease: Secondary | ICD-10-CM | POA: Diagnosis not present

## 2021-10-10 DIAGNOSIS — N2581 Secondary hyperparathyroidism of renal origin: Secondary | ICD-10-CM | POA: Diagnosis not present

## 2021-10-10 DIAGNOSIS — D509 Iron deficiency anemia, unspecified: Secondary | ICD-10-CM | POA: Diagnosis not present

## 2021-10-10 DIAGNOSIS — E1122 Type 2 diabetes mellitus with diabetic chronic kidney disease: Secondary | ICD-10-CM | POA: Diagnosis not present

## 2021-10-11 ENCOUNTER — Telehealth: Payer: Self-pay | Admitting: Infectious Diseases

## 2021-10-11 DIAGNOSIS — N2589 Other disorders resulting from impaired renal tubular function: Secondary | ICD-10-CM | POA: Diagnosis not present

## 2021-10-11 DIAGNOSIS — N186 End stage renal disease: Secondary | ICD-10-CM | POA: Diagnosis not present

## 2021-10-11 DIAGNOSIS — Z992 Dependence on renal dialysis: Secondary | ICD-10-CM | POA: Diagnosis not present

## 2021-10-11 DIAGNOSIS — E1122 Type 2 diabetes mellitus with diabetic chronic kidney disease: Secondary | ICD-10-CM | POA: Diagnosis not present

## 2021-10-11 DIAGNOSIS — N2581 Secondary hyperparathyroidism of renal origin: Secondary | ICD-10-CM | POA: Diagnosis not present

## 2021-10-11 DIAGNOSIS — D509 Iron deficiency anemia, unspecified: Secondary | ICD-10-CM | POA: Diagnosis not present

## 2021-10-11 DIAGNOSIS — D631 Anemia in chronic kidney disease: Secondary | ICD-10-CM | POA: Diagnosis not present

## 2021-10-11 NOTE — Telephone Encounter (Signed)
I have been trying to reach this patient for the past 72 hrs - He has an elevated CPK- he is on daptmycin Q48 hrs as he has ESRD- the recommendation is to stiop daptomycin- I left 3 messages for the aptient and also left a message for his mom who is his next of kin- I spoke to the pharmacist Ms.Falls on Friday and told her to discontinue daptomycin and to get in touch with patient. Today I call [facility] again and left message.I will reach out to his provider at [facility] tomorrow

## 2021-10-12 DIAGNOSIS — E1122 Type 2 diabetes mellitus with diabetic chronic kidney disease: Secondary | ICD-10-CM | POA: Diagnosis not present

## 2021-10-12 DIAGNOSIS — Z992 Dependence on renal dialysis: Secondary | ICD-10-CM | POA: Diagnosis not present

## 2021-10-12 DIAGNOSIS — D631 Anemia in chronic kidney disease: Secondary | ICD-10-CM | POA: Diagnosis not present

## 2021-10-12 DIAGNOSIS — R7881 Bacteremia: Secondary | ICD-10-CM | POA: Diagnosis not present

## 2021-10-12 DIAGNOSIS — B9562 Methicillin resistant Staphylococcus aureus infection as the cause of diseases classified elsewhere: Secondary | ICD-10-CM | POA: Diagnosis not present

## 2021-10-12 DIAGNOSIS — N2581 Secondary hyperparathyroidism of renal origin: Secondary | ICD-10-CM | POA: Diagnosis not present

## 2021-10-12 DIAGNOSIS — N186 End stage renal disease: Secondary | ICD-10-CM | POA: Diagnosis not present

## 2021-10-12 DIAGNOSIS — N2589 Other disorders resulting from impaired renal tubular function: Secondary | ICD-10-CM | POA: Diagnosis not present

## 2021-10-12 DIAGNOSIS — D509 Iron deficiency anemia, unspecified: Secondary | ICD-10-CM | POA: Diagnosis not present

## 2021-10-13 ENCOUNTER — Telehealth: Payer: Self-pay | Admitting: Infectious Diseases

## 2021-10-13 ENCOUNTER — Telehealth: Payer: Self-pay

## 2021-10-13 DIAGNOSIS — N2589 Other disorders resulting from impaired renal tubular function: Secondary | ICD-10-CM | POA: Diagnosis not present

## 2021-10-13 DIAGNOSIS — D509 Iron deficiency anemia, unspecified: Secondary | ICD-10-CM | POA: Diagnosis not present

## 2021-10-13 DIAGNOSIS — N186 End stage renal disease: Secondary | ICD-10-CM | POA: Diagnosis not present

## 2021-10-13 DIAGNOSIS — Z992 Dependence on renal dialysis: Secondary | ICD-10-CM | POA: Diagnosis not present

## 2021-10-13 DIAGNOSIS — N2581 Secondary hyperparathyroidism of renal origin: Secondary | ICD-10-CM | POA: Diagnosis not present

## 2021-10-13 DIAGNOSIS — E1122 Type 2 diabetes mellitus with diabetic chronic kidney disease: Secondary | ICD-10-CM | POA: Diagnosis not present

## 2021-10-13 DIAGNOSIS — D631 Anemia in chronic kidney disease: Secondary | ICD-10-CM | POA: Diagnosis not present

## 2021-10-13 NOTE — Telephone Encounter (Signed)
Patient returning call, says he received Dr. Gwenevere Ghazi messages and stopped the daptomycin yesterday 12/5. He tried to page Dr. Delaine Lame to let her know, but the hospital operator would not page her for him. He states he "feels fine."   Beryle Flock, RN

## 2021-10-13 NOTE — Telephone Encounter (Signed)
Called pt 2 times per md request and pt did not answer. Md need to discuss pt results asap (elevated CPK) and I left vm on 10/13/2021 to call our office back in order to discuss results with pt.

## 2021-10-13 NOTE — Telephone Encounter (Signed)
Dr Delaine Lame has been trying to reach patient regarding abnormal CPK and needing to ask him to stop Daptomycin. She has left 3 messages for him since Friday and also spoke to [facility] in  [facility]- We need to stay on this and follo wup on what is going on with him. Please try to track this one down and send notice to Dr Delaine Lame and Theresia Majors. This is URGENT request from provider.

## 2021-10-13 NOTE — Telephone Encounter (Signed)
Called patient, no answer. Left HIPAA compliant voicemail requesting callback to [facility] phone number.   Beryle Flock, RN

## 2021-10-14 ENCOUNTER — Other Ambulatory Visit: Payer: Self-pay | Admitting: Infectious Diseases

## 2021-10-14 DIAGNOSIS — D509 Iron deficiency anemia, unspecified: Secondary | ICD-10-CM | POA: Diagnosis not present

## 2021-10-14 DIAGNOSIS — D631 Anemia in chronic kidney disease: Secondary | ICD-10-CM | POA: Diagnosis not present

## 2021-10-14 DIAGNOSIS — N186 End stage renal disease: Secondary | ICD-10-CM | POA: Diagnosis not present

## 2021-10-14 DIAGNOSIS — Z992 Dependence on renal dialysis: Secondary | ICD-10-CM | POA: Diagnosis not present

## 2021-10-14 DIAGNOSIS — R7881 Bacteremia: Secondary | ICD-10-CM

## 2021-10-14 DIAGNOSIS — N2581 Secondary hyperparathyroidism of renal origin: Secondary | ICD-10-CM | POA: Diagnosis not present

## 2021-10-14 DIAGNOSIS — E1122 Type 2 diabetes mellitus with diabetic chronic kidney disease: Secondary | ICD-10-CM | POA: Diagnosis not present

## 2021-10-14 DIAGNOSIS — N2589 Other disorders resulting from impaired renal tubular function: Secondary | ICD-10-CM | POA: Diagnosis not present

## 2021-10-14 DIAGNOSIS — B9562 Methicillin resistant Staphylococcus aureus infection as the cause of diseases classified elsewhere: Secondary | ICD-10-CM

## 2021-10-14 MED ORDER — LINEZOLID 600 MG PO TABS: 600.0000 mg | ORAL_TABLET | Freq: Two times a day (BID) | ORAL | 0 refills | Status: AC

## 2021-10-14 NOTE — Progress Notes (Signed)
MRSA bacteremia with left foot infection- has taken 3 weeks of IV antibiotics- Nathan Ayers Nathan Ayers start linezolid 612m po bid for 3 week Explained the side effects of liinezolid Need to monitor cbc/cmp weekly He has stopped daptomycin because of CPK worsening to 1123 He still has a central line ( he is a peritoneal dialysis patient)- Nathan Ayers remove that if he tolerates linezolid

## 2021-10-15 ENCOUNTER — Encounter: Payer: Self-pay | Admitting: Infectious Diseases

## 2021-10-15 DIAGNOSIS — N2581 Secondary hyperparathyroidism of renal origin: Secondary | ICD-10-CM | POA: Diagnosis not present

## 2021-10-15 DIAGNOSIS — E1122 Type 2 diabetes mellitus with diabetic chronic kidney disease: Secondary | ICD-10-CM | POA: Diagnosis not present

## 2021-10-15 DIAGNOSIS — D509 Iron deficiency anemia, unspecified: Secondary | ICD-10-CM | POA: Diagnosis not present

## 2021-10-15 DIAGNOSIS — N186 End stage renal disease: Secondary | ICD-10-CM | POA: Diagnosis not present

## 2021-10-15 DIAGNOSIS — D631 Anemia in chronic kidney disease: Secondary | ICD-10-CM | POA: Diagnosis not present

## 2021-10-15 DIAGNOSIS — N2589 Other disorders resulting from impaired renal tubular function: Secondary | ICD-10-CM | POA: Diagnosis not present

## 2021-10-15 DIAGNOSIS — Z992 Dependence on renal dialysis: Secondary | ICD-10-CM | POA: Diagnosis not present

## 2021-10-16 ENCOUNTER — Other Ambulatory Visit: Payer: Self-pay | Admitting: Infectious Diseases

## 2021-10-16 ENCOUNTER — Telehealth: Payer: Self-pay

## 2021-10-16 DIAGNOSIS — D509 Iron deficiency anemia, unspecified: Secondary | ICD-10-CM | POA: Diagnosis not present

## 2021-10-16 DIAGNOSIS — N2589 Other disorders resulting from impaired renal tubular function: Secondary | ICD-10-CM | POA: Diagnosis not present

## 2021-10-16 DIAGNOSIS — E1122 Type 2 diabetes mellitus with diabetic chronic kidney disease: Secondary | ICD-10-CM | POA: Diagnosis not present

## 2021-10-16 DIAGNOSIS — N186 End stage renal disease: Secondary | ICD-10-CM | POA: Diagnosis not present

## 2021-10-16 DIAGNOSIS — Z992 Dependence on renal dialysis: Secondary | ICD-10-CM | POA: Diagnosis not present

## 2021-10-16 DIAGNOSIS — N2581 Secondary hyperparathyroidism of renal origin: Secondary | ICD-10-CM | POA: Diagnosis not present

## 2021-10-16 DIAGNOSIS — D631 Anemia in chronic kidney disease: Secondary | ICD-10-CM | POA: Diagnosis not present

## 2021-10-16 MED ORDER — DOXYCYCLINE HYCLATE 100 MG PO TABS: 100.0000 mg | ORAL_TABLET | Freq: Two times a day (BID) | ORAL | 0 refills | Status: AC

## 2021-10-16 NOTE — Telephone Encounter (Signed)
Per [facility], linezolid is out of stock, but they will hopefully get some in today.   Per Dr. Delaine Lame, patient needs to start doxycycline until he is able to get the linezolid. Advised patient that it was sent to his pharmacy and he should be able to pick it up today. Recommended he take the doxy with food to avoid GI upset. He understands that he is to start taking the doxycycline until the linezolid is available and then will need to switch. He has no further questions.   Beryle Flock, RN

## 2021-10-16 NOTE — Progress Notes (Signed)
Linezolid prescription was sent to [facility] on 12/7. Patient has not received the prescription yet- will do a stop gap doxy.128m po BID

## 2021-10-17 DIAGNOSIS — N2589 Other disorders resulting from impaired renal tubular function: Secondary | ICD-10-CM | POA: Diagnosis not present

## 2021-10-17 DIAGNOSIS — N186 End stage renal disease: Secondary | ICD-10-CM | POA: Diagnosis not present

## 2021-10-17 DIAGNOSIS — D509 Iron deficiency anemia, unspecified: Secondary | ICD-10-CM | POA: Diagnosis not present

## 2021-10-17 DIAGNOSIS — Z992 Dependence on renal dialysis: Secondary | ICD-10-CM | POA: Diagnosis not present

## 2021-10-17 DIAGNOSIS — E1122 Type 2 diabetes mellitus with diabetic chronic kidney disease: Secondary | ICD-10-CM | POA: Diagnosis not present

## 2021-10-17 DIAGNOSIS — D631 Anemia in chronic kidney disease: Secondary | ICD-10-CM | POA: Diagnosis not present

## 2021-10-17 DIAGNOSIS — N2581 Secondary hyperparathyroidism of renal origin: Secondary | ICD-10-CM | POA: Diagnosis not present

## 2021-10-18 DIAGNOSIS — E1122 Type 2 diabetes mellitus with diabetic chronic kidney disease: Secondary | ICD-10-CM | POA: Diagnosis not present

## 2021-10-18 DIAGNOSIS — Z992 Dependence on renal dialysis: Secondary | ICD-10-CM | POA: Diagnosis not present

## 2021-10-18 DIAGNOSIS — D509 Iron deficiency anemia, unspecified: Secondary | ICD-10-CM | POA: Diagnosis not present

## 2021-10-18 DIAGNOSIS — N2589 Other disorders resulting from impaired renal tubular function: Secondary | ICD-10-CM | POA: Diagnosis not present

## 2021-10-18 DIAGNOSIS — D631 Anemia in chronic kidney disease: Secondary | ICD-10-CM | POA: Diagnosis not present

## 2021-10-18 DIAGNOSIS — N2581 Secondary hyperparathyroidism of renal origin: Secondary | ICD-10-CM | POA: Diagnosis not present

## 2021-10-18 DIAGNOSIS — N186 End stage renal disease: Secondary | ICD-10-CM | POA: Diagnosis not present

## 2021-10-19 ENCOUNTER — Other Ambulatory Visit (INDEPENDENT_AMBULATORY_CARE_PROVIDER_SITE_OTHER): Payer: Self-pay | Admitting: Nurse Practitioner

## 2021-10-19 DIAGNOSIS — E1122 Type 2 diabetes mellitus with diabetic chronic kidney disease: Secondary | ICD-10-CM | POA: Diagnosis not present

## 2021-10-19 DIAGNOSIS — I739 Peripheral vascular disease, unspecified: Secondary | ICD-10-CM

## 2021-10-19 DIAGNOSIS — D509 Iron deficiency anemia, unspecified: Secondary | ICD-10-CM | POA: Diagnosis not present

## 2021-10-19 DIAGNOSIS — D631 Anemia in chronic kidney disease: Secondary | ICD-10-CM | POA: Diagnosis not present

## 2021-10-19 DIAGNOSIS — N2589 Other disorders resulting from impaired renal tubular function: Secondary | ICD-10-CM | POA: Diagnosis not present

## 2021-10-19 DIAGNOSIS — N186 End stage renal disease: Secondary | ICD-10-CM | POA: Diagnosis not present

## 2021-10-19 DIAGNOSIS — B9562 Methicillin resistant Staphylococcus aureus infection as the cause of diseases classified elsewhere: Secondary | ICD-10-CM | POA: Diagnosis not present

## 2021-10-19 DIAGNOSIS — Z992 Dependence on renal dialysis: Secondary | ICD-10-CM | POA: Diagnosis not present

## 2021-10-19 DIAGNOSIS — N2581 Secondary hyperparathyroidism of renal origin: Secondary | ICD-10-CM | POA: Diagnosis not present

## 2021-10-20 ENCOUNTER — Encounter (INDEPENDENT_AMBULATORY_CARE_PROVIDER_SITE_OTHER): Payer: Self-pay | Admitting: Nurse Practitioner

## 2021-10-20 ENCOUNTER — Other Ambulatory Visit
Admission: RE | Admit: 2021-10-20 | Discharge: 2021-10-20 | Disposition: A | Discharge status: Home / Self Care | Payer: Medicare Other | Source: Ambulatory Visit | Attending: Infectious Diseases | Admitting: Infectious Diseases

## 2021-10-20 ENCOUNTER — Other Ambulatory Visit: Payer: Self-pay

## 2021-10-20 ENCOUNTER — Ambulatory Visit (INDEPENDENT_AMBULATORY_CARE_PROVIDER_SITE_OTHER): Payer: Medicare Other | Admitting: Nurse Practitioner

## 2021-10-20 ENCOUNTER — Ambulatory Visit (INDEPENDENT_AMBULATORY_CARE_PROVIDER_SITE_OTHER): Payer: Medicare Other

## 2021-10-20 ENCOUNTER — Ambulatory Visit: Payer: Medicare Other | Attending: Infectious Diseases | Admitting: Infectious Diseases

## 2021-10-20 VITALS — BP 158/78 | HR 80 | Resp 16 | Wt 223.1 lb

## 2021-10-20 VITALS — BP 153/72 | HR 80 | Resp 16 | Ht 78.0 in | Wt 220.0 lb

## 2021-10-20 DIAGNOSIS — Z7901 Long term (current) use of anticoagulants: Secondary | ICD-10-CM | POA: Insufficient documentation

## 2021-10-20 DIAGNOSIS — R7881 Bacteremia: Secondary | ICD-10-CM | POA: Diagnosis not present

## 2021-10-20 DIAGNOSIS — Z79899 Other long term (current) drug therapy: Secondary | ICD-10-CM | POA: Insufficient documentation

## 2021-10-20 DIAGNOSIS — Z452 Encounter for adjustment and management of vascular access device: Secondary | ICD-10-CM | POA: Diagnosis not present

## 2021-10-20 DIAGNOSIS — B9562 Methicillin resistant Staphylococcus aureus infection as the cause of diseases classified elsewhere: Secondary | ICD-10-CM | POA: Insufficient documentation

## 2021-10-20 DIAGNOSIS — N186 End stage renal disease: Secondary | ICD-10-CM | POA: Diagnosis not present

## 2021-10-20 DIAGNOSIS — L089 Local infection of the skin and subcutaneous tissue, unspecified: Secondary | ICD-10-CM | POA: Insufficient documentation

## 2021-10-20 DIAGNOSIS — E1122 Type 2 diabetes mellitus with diabetic chronic kidney disease: Secondary | ICD-10-CM | POA: Diagnosis not present

## 2021-10-20 DIAGNOSIS — I120 Hypertensive chronic kidney disease with stage 5 chronic kidney disease or end stage renal disease: Secondary | ICD-10-CM | POA: Diagnosis not present

## 2021-10-20 DIAGNOSIS — L97522 Non-pressure chronic ulcer of other part of left foot with fat layer exposed: Secondary | ICD-10-CM | POA: Diagnosis not present

## 2021-10-20 DIAGNOSIS — Z21 Asymptomatic human immunodeficiency virus [HIV] infection status: Secondary | ICD-10-CM | POA: Diagnosis not present

## 2021-10-20 DIAGNOSIS — Z992 Dependence on renal dialysis: Secondary | ICD-10-CM | POA: Diagnosis not present

## 2021-10-20 DIAGNOSIS — D649 Anemia, unspecified: Secondary | ICD-10-CM | POA: Diagnosis not present

## 2021-10-20 DIAGNOSIS — I739 Peripheral vascular disease, unspecified: Secondary | ICD-10-CM

## 2021-10-20 DIAGNOSIS — N2589 Other disorders resulting from impaired renal tubular function: Secondary | ICD-10-CM | POA: Diagnosis not present

## 2021-10-20 DIAGNOSIS — N2581 Secondary hyperparathyroidism of renal origin: Secondary | ICD-10-CM | POA: Diagnosis not present

## 2021-10-20 DIAGNOSIS — D631 Anemia in chronic kidney disease: Secondary | ICD-10-CM | POA: Diagnosis not present

## 2021-10-20 DIAGNOSIS — D509 Iron deficiency anemia, unspecified: Secondary | ICD-10-CM | POA: Diagnosis not present

## 2021-10-20 DIAGNOSIS — I10 Essential (primary) hypertension: Secondary | ICD-10-CM

## 2021-10-20 LAB — CBC WITH DIFFERENTIAL/PLATELET
Abs Immature Granulocytes: 0.06 10*3/uL (ref 0.00–0.07)
Basophils Absolute: 0.1 10*3/uL (ref 0.0–0.1)
Basophils Relative: 1 %
Eosinophils Absolute: 0.3 10*3/uL (ref 0.0–0.5)
Eosinophils Relative: 3 %
HCT: 22.2 % — ABNORMAL LOW (ref 39.0–52.0)
Hemoglobin: 7.6 g/dL — ABNORMAL LOW (ref 13.0–17.0)
Immature Granulocytes: 1 %
Lymphocytes Relative: 25 %
Lymphs Abs: 2.2 10*3/uL (ref 0.7–4.0)
MCH: 29.7 pg (ref 26.0–34.0)
MCHC: 34.2 g/dL (ref 30.0–36.0)
MCV: 86.7 fL (ref 80.0–100.0)
Monocytes Absolute: 0.9 10*3/uL (ref 0.1–1.0)
Monocytes Relative: 10 %
Neutro Abs: 5.5 10*3/uL (ref 1.7–7.7)
Neutrophils Relative %: 60 %
Platelets: 246 10*3/uL (ref 150–400)
RBC: 2.56 MIL/uL — ABNORMAL LOW (ref 4.22–5.81)
RDW: 14.6 % (ref 11.5–15.5)
WBC: 9.1 10*3/uL (ref 4.0–10.5)
nRBC: 0 % (ref 0.0–0.2)

## 2021-10-20 LAB — COMPREHENSIVE METABOLIC PANEL
ALT: 25 U/L (ref 0–44)
AST: 24 U/L (ref 15–41)
Albumin: 3.2 g/dL — ABNORMAL LOW (ref 3.5–5.0)
Alkaline Phosphatase: 59 U/L (ref 38–126)
Anion gap: 12 (ref 5–15)
BUN: 58 mg/dL — ABNORMAL HIGH (ref 6–20)
CO2: 27 mmol/L (ref 22–32)
Calcium: 9 mg/dL (ref 8.9–10.3)
Chloride: 97 mmol/L — ABNORMAL LOW (ref 98–111)
Creatinine, Ser: 13.53 mg/dL — ABNORMAL HIGH (ref 0.61–1.24)
GFR, Estimated: 4 mL/min — ABNORMAL LOW (ref >60)
Glucose, Bld: 155 mg/dL — ABNORMAL HIGH (ref 70–99)
Potassium: 4 mmol/L (ref 3.5–5.1)
Sodium: 136 mmol/L (ref 135–145)
Total Bilirubin: 0.7 mg/dL (ref 0.3–1.2)
Total Protein: 7.7 g/dL (ref 6.5–8.1)

## 2021-10-20 LAB — CK: Total CK: 289 U/L (ref 49–397)

## 2021-10-20 LAB — SEDIMENTATION RATE: Sed Rate: 125 mm/hr — ABNORMAL HIGH (ref 0–15)

## 2021-10-20 NOTE — Progress Notes (Signed)
NAME: Nathan Ayers  DOB: [birth year]  MRN: [id number]  Date/Time: 10/20/2021 10:30 AM   Subjective:   ? Nathan Ayers is a 50 y.o. male with a history of diabetes mellitus, end-stage renal disease on peritoneal dialysis, hypertension, HIV, is here for follow-up after recent hospitalization. Patient was hospitalized to [facility] between 09/22/2021 and 10/05/2021 for fever and chills.  Found to have MRSA bacteremia.  Initially the source was unclear until it revealed itself by purulent discharge from the left foot. He underwent first ray amputation on 09/27/2021 by Dr. Vickki Muff.  He had to undergo repeat IND on 09/30/2021.  He also had TEE which was negative for endocarditis.  Repeat blood culture was negative for MRSA. A central line was placed and he was sent home on IV daptomycin. The daptomycin was every 48 hours. CPK was monitored weekly and it was increasing from 46 and it went up to 558.  We tried to reach the patient multiple times from 10/29/2021 until 10/11/2021.  Given [facility] and tried.  After multiple messages were left patient called back saying that he had discontinued the daptomycin on 10/11/2021.  A prescription for linezolid was sent on 10/14/2021.  But it was not available for him until 10/16/2021.  He has been taking linezolid since then.  He has been tolerating it well.  No fever or chills The left foot is puffy but there is no discharge.  Past Medical History:  Diagnosis Date   Anxiety    Chronic kidney disease (CKD), active medical management without dialysis    Mon, Wed and Fri   Chronic kidney disease on chronic dialysis ([place])    Diabetes mellitus ([place])    Diabetic neuropathy ([place])    Dyspnea    HIV infection ([place])    Hypertension     Past Surgical History:  Procedure Laterality Date   AMPUTATION Left 09/27/2021   Procedure: PARTIAL AMPUTATION FIRST RAY WITH APPLICATION OF ANTIBIOTIC BEADS;  Surgeon: Caroline More, DPM;  Location:  [facility] ORS;  Service: Podiatry;  Laterality: Left;   AMPUTATION TOE Left 02/04/2017   Procedure: AMPUTATION 5TH METATARSAL AND JOINT;  Surgeon: Samara Deist, DPM;  Location: [facility];  Service: Podiatry;  Laterality: Left;   APPLICATION OF WOUND VAC Left 05/20/2017   Procedure: APPLICATION OF WOUND VAC;  Surgeon: Samara Deist, DPM;  Location: [facility];  Service: Podiatry;  Laterality: Left;   AV FISTULA PLACEMENT     BONE EXCISION Left 02/17/2018   Procedure: BONE EXCISION METATARSAL;  Surgeon: Samara Deist, DPM;  Location: [facility];  Service: Podiatry;  Laterality: Left;   INCISION AND DRAINAGE Left 09/27/2021   Procedure: INCISION AND DRAINAGE;  Surgeon: Caroline More, DPM;  Location: [facility];  Service: Podiatry;  Laterality: Left;   INCISION AND DRAINAGE Left 09/30/2021   Procedure: INCISION AND DRAINAGE;  Surgeon: Samara Deist, DPM;  Location: [facility];  Service: Podiatry;  Laterality: Left;   INCISION AND DRAINAGE OF WOUND Left 05/20/2017   Procedure: IRRIGATION AND DEBRIDEMENT WOUND;  Surgeon: Samara Deist, DPM;  Location: [facility];  Service: Podiatry;  Laterality: Left;   IR PERC TUN PERIT CATH WO PORT S&I /IMAG  09/29/2021   IR US GUIDANCE  09/29/2021   TEE WITHOUT CARDIOVERSION N/A 09/24/2021   Procedure: TRANSESOPHAGEAL ECHOCARDIOGRAM (TEE);  Surgeon: [name], MD;  Location: [facility] ORS;  Service: Cardiovascular;  Laterality: N/A;    Social History   Socioeconomic History   Marital status: Single  Spouse name: Not on file   Number of children: Not on file   Years of education: Not on file   Highest education level: Not on file  Occupational History   Not on file  Tobacco Use   Smoking status: Never   Smokeless tobacco: Never  Vaping Use   Vaping Use: Never used  Substance and Sexual Activity   Alcohol use: No   Drug use: No   Sexual activity: Not Currently    Partners: Male    Birth control/protection: Condom  Other Topics Concern   Not on file  Social  History Narrative   Not on file   Social Determinants of Health   Financial Resource Strain: Not on file  Food Insecurity: Not on file  Transportation Needs: Not on file  Physical Activity: Not on file  Stress: Not on file  Social Connections: Not on file  Intimate Partner Violence: Not on file    Family History  Problem Relation Age of Onset   Diabetes Mother    Hypertension Mother    Allergies  Allergen Reactions   Lactose Intolerance (Gi)    I? Current Outpatient Medications  Medication Sig Dispense Refill   abacavir (ZIAGEN) 300 MG tablet Take 300 mg by mouth 2 (two) times daily.      amLODipine (NORVASC) 5 MG tablet Take 5 mg by mouth daily.     carvedilol (COREG) 25 MG tablet Take 25 mg by mouth 2 (two) times daily.   9   cinacalcet (SENSIPAR) 30 MG tablet Take 30 mg by mouth daily.     doxycycline (VIBRA-TABS) 100 MG tablet Take 1 tablet (100 mg total) by mouth 2 (two) times daily. 10 tablet 0   lamiVUDine (EPIVIR) 10 MG/ML solution Take 2.5 mLs by mouth at bedtime.   3   lanthanum (FOSRENOL) 1000 MG chewable tablet Chew 2,000 mg by mouth 3 (three) times daily.     linezolid (ZYVOX) 600 MG tablet Take 1 tablet (600 mg total) by mouth 2 (two) times daily. 40 tablet 0   losartan (COZAAR) 100 MG tablet Take 1 tablet (100 mg total) by mouth daily. 30 tablet 3   minoxidil (LONITEN) 2.5 MG tablet Take 2.5 mg by mouth See admin instructions. TAKES TWICE DAILY, EXCEPT FOR THE AM DOSE ON DIALYSIS DAYS; MON, WED, FRI  3   Nutritional Supplements (FEEDING SUPPLEMENT, NEPRO CARB STEADY,) LIQD Take 237 mLs by mouth as needed (missed meal during dialysis.). 30 Can 0   ondansetron (ZOFRAN-ODT) 4 MG disintegrating tablet Take 4 mg by mouth every 8 (eight) hours as needed for nausea or vomiting.      SANTYL ointment Apply 1 application topically daily. APPLIES TO FOOT USING GAUZE THEN WRAPS FOOT  2   TIVICAY 50 MG tablet Take 50 mg by mouth 2 (two) times daily.   3   No current  facility-administered medications for this visit.     Abtx:  Anti-infectives (From admission, onward)    None       REVIEW OF SYSTEMS:  Const: negative fever, negative chills, negative weight loss Eyes: negative diplopia or visual changes, negative eye pain ENT: negative coryza, negative sore throat Resp: negative cough, hemoptysis, dyspnea Cards: negative for chest pain, palpitations, lower extremity edema GU: negative for frequency, dysuria and hematuria GI: Negative for abdominal pain, diarrhea, bleeding, constipation Skin: negative for rash and pruritus Heme: negative for easy bruising and gum/nose bleeding MS: negative for myalgias, arthralgias, back pain and muscle weakness Neurolo:negative  for headaches, dizziness, vertigo, memory problems  Psych: negative for feelings of anxiety, depression  Endocrine: diabetes Allergy/Immunology-prior to starting daptomycin as well as allergy was lactose Objective:  VITALS:  BP (!) 158/78    Pulse 80    Resp 16    Wt 223 lb 1.8 oz (101.2 kg)    SpO2 98%    BMI 25.78 kg/m   PHYSICAL EXAM:  General: Alert, cooperative, no distress, appears stated age.  Head: Normocephalic, without obvious abnormality, atraumatic. Eyes: Conjunctivae clear, anicteric sclerae. Pupils are equal ENT Nares normal. No drainage or sinus tenderness. Lips, mucosa, and tongue normal. No Thrush Neck: Supple, symmetrical, no adenopathy, thyroid: non tender no carotid bruit and no JVD. Back: No CVA tenderness. Lungs: Clear to auscultation bilaterally. No Wheezing or Rhonchi. No rales. Heart: Regular rate and rhythm, no murmur, rub or gallop. Abdomen: Did not examine as patient was in wheelchair. Extremities:  Surgical site has coapted well First toe amputation No discharge Foot is swollen. Skin: No rashes or lesions. Or bruising Lymph: Cervical, supraclavicular normal. Neurologic: Grossly non-focal Pertinent Labs Lab Results CBC    Component Value  Date/Time   WBC 9.1 10/05/2021 0445   RBC 2.60 (L) 10/05/2021 0445   HGB 7.6 (L) 10/05/2021 0445   HGB 9.1 (L) 09/23/2021 0643   HCT 21.8 (L) 10/05/2021 0445   HCT 25.6 (L) 09/23/2021 0643   PLT 297 10/05/2021 0445   PLT 330 09/23/2021 0643   MCV 83.8 10/05/2021 0445   MCV 82 09/23/2021 0643   MCH 29.2 10/05/2021 0445   MCHC 34.9 10/05/2021 0445   RDW 14.3 10/05/2021 0445   RDW 15.1 09/23/2021 0643   LYMPHSABS 2.6 10/05/2021 0445   LYMPHSABS 1.9 09/23/2021 0643   MONOABS 0.8 10/05/2021 0445   EOSABS 0.2 10/05/2021 0445   EOSABS 0.1 09/23/2021 0643   BASOSABS 0.1 10/05/2021 0445   BASOSABS 0.1 09/23/2021 0643    CMP Latest Ref Rng & Units 10/05/2021 10/04/2021 10/03/2021  Glucose 70 - 99 mg/dL 170(H) 164(H) 173(H)  BUN 6 - 20 mg/dL 65(H) 63(H) 67(H)  Creatinine 0.61 - 1.24 mg/dL 14.60(H) 14.72(H) 15.21(H)  Sodium 135 - 145 mmol/L 129(L) 132(L) 133(L)  Potassium 3.5 - 5.1 mmol/L 4.3 4.3 4.3  Chloride 98 - 111 mmol/L 93(L) 95(L) 96(L)  CO2 22 - 32 mmol/L 23 24 23   Calcium 8.9 - 10.3 mg/dL 8.6(L) 8.8(L) 8.6(L)  Total Protein 6.5 - 8.1 g/dL - - -  Total Bilirubin 0.3 - 1.2 mg/dL - - -  Alkaline Phos 38 - 126 U/L - - -  AST 15 - 41 U/L - - -  ALT 0 - 44 U/L - - -    ? Impression/recommendation ? MRSA bacteremia.  Secondary to MRSA foot infection with osteomyelitis Status post first toe ray amputation and I&D of the abscess Patient is supposed to get 6 weeks of IV antibiotics and will finish on 11/01/2021..  But there has been interruption in his treatment.  Initially it was rhabdomyolysis caused by daptomycin and that was stopped.  Linezolid prescription was sent but there has been a delay in collecting the medication and he had a gap of at least 4 days with no treatment. Today we will send blood cultures. Has been given a prescription for 3 weeks of linezolid. Will follow CBC closely. Patient will inform me about the [facility] and next week he will get his  labs done there.  End-stage renal disease on peritoneal dialysis.  I  doubt that he is getting adequately dialyzed because his creatinine has been always been about 12. Will need to discuss with his nephrologist.  Anemia combination of end-stage renal disease and infection.  HIV well-controlled on abacavir, Epivir and Tivicay  Hypertension on amlodipine, carvedilol and losartan  Follow-up in 4 weeks  ___________________________________________________ Discussed with patient in great detail. Note:  This document was prepared using Dragon voice recognition software and may include unintentional dictation errors.

## 2021-10-20 NOTE — Patient Instructions (Signed)
You are here for follow up of the left foot infection- you are currently on linezolid as the daptomycin caused muscle breakdown. Today we will do blood cultures And you will need weekly labs- if you let us know the address of [place] [facility] we can fax to them

## 2021-10-21 DIAGNOSIS — E1122 Type 2 diabetes mellitus with diabetic chronic kidney disease: Secondary | ICD-10-CM | POA: Diagnosis not present

## 2021-10-21 DIAGNOSIS — N186 End stage renal disease: Secondary | ICD-10-CM | POA: Diagnosis not present

## 2021-10-21 DIAGNOSIS — D631 Anemia in chronic kidney disease: Secondary | ICD-10-CM | POA: Diagnosis not present

## 2021-10-21 DIAGNOSIS — N2581 Secondary hyperparathyroidism of renal origin: Secondary | ICD-10-CM | POA: Diagnosis not present

## 2021-10-21 DIAGNOSIS — N2589 Other disorders resulting from impaired renal tubular function: Secondary | ICD-10-CM | POA: Diagnosis not present

## 2021-10-21 DIAGNOSIS — D509 Iron deficiency anemia, unspecified: Secondary | ICD-10-CM | POA: Diagnosis not present

## 2021-10-21 DIAGNOSIS — Z992 Dependence on renal dialysis: Secondary | ICD-10-CM | POA: Diagnosis not present

## 2021-10-22 ENCOUNTER — Telehealth: Payer: Self-pay

## 2021-10-22 DIAGNOSIS — D509 Iron deficiency anemia, unspecified: Secondary | ICD-10-CM | POA: Diagnosis not present

## 2021-10-22 DIAGNOSIS — E1122 Type 2 diabetes mellitus with diabetic chronic kidney disease: Secondary | ICD-10-CM | POA: Diagnosis not present

## 2021-10-22 DIAGNOSIS — Z992 Dependence on renal dialysis: Secondary | ICD-10-CM | POA: Diagnosis not present

## 2021-10-22 DIAGNOSIS — N2581 Secondary hyperparathyroidism of renal origin: Secondary | ICD-10-CM | POA: Diagnosis not present

## 2021-10-22 DIAGNOSIS — N186 End stage renal disease: Secondary | ICD-10-CM | POA: Diagnosis not present

## 2021-10-22 DIAGNOSIS — N2589 Other disorders resulting from impaired renal tubular function: Secondary | ICD-10-CM | POA: Diagnosis not present

## 2021-10-22 DIAGNOSIS — D631 Anemia in chronic kidney disease: Secondary | ICD-10-CM | POA: Diagnosis not present

## 2021-10-22 NOTE — Telephone Encounter (Signed)
Advised so far cultures look good. Patient reports some loose stools and gas with Linezolid but states he can handle it. Discussed diet changes and drinking plenty of fluids. Patient will call if this gets worse but states he feels fine to remain on this medication.

## 2021-10-23 DIAGNOSIS — D509 Iron deficiency anemia, unspecified: Secondary | ICD-10-CM | POA: Diagnosis not present

## 2021-10-23 DIAGNOSIS — N186 End stage renal disease: Secondary | ICD-10-CM | POA: Diagnosis not present

## 2021-10-23 DIAGNOSIS — Z992 Dependence on renal dialysis: Secondary | ICD-10-CM | POA: Diagnosis not present

## 2021-10-23 DIAGNOSIS — D631 Anemia in chronic kidney disease: Secondary | ICD-10-CM | POA: Diagnosis not present

## 2021-10-23 DIAGNOSIS — N2589 Other disorders resulting from impaired renal tubular function: Secondary | ICD-10-CM | POA: Diagnosis not present

## 2021-10-23 DIAGNOSIS — N2581 Secondary hyperparathyroidism of renal origin: Secondary | ICD-10-CM | POA: Diagnosis not present

## 2021-10-23 DIAGNOSIS — E1122 Type 2 diabetes mellitus with diabetic chronic kidney disease: Secondary | ICD-10-CM | POA: Diagnosis not present

## 2021-10-24 DIAGNOSIS — N2581 Secondary hyperparathyroidism of renal origin: Secondary | ICD-10-CM | POA: Diagnosis not present

## 2021-10-24 DIAGNOSIS — Z992 Dependence on renal dialysis: Secondary | ICD-10-CM | POA: Diagnosis not present

## 2021-10-24 DIAGNOSIS — D509 Iron deficiency anemia, unspecified: Secondary | ICD-10-CM | POA: Diagnosis not present

## 2021-10-24 DIAGNOSIS — N2589 Other disorders resulting from impaired renal tubular function: Secondary | ICD-10-CM | POA: Diagnosis not present

## 2021-10-24 DIAGNOSIS — N186 End stage renal disease: Secondary | ICD-10-CM | POA: Diagnosis not present

## 2021-10-24 DIAGNOSIS — E1122 Type 2 diabetes mellitus with diabetic chronic kidney disease: Secondary | ICD-10-CM | POA: Diagnosis not present

## 2021-10-24 DIAGNOSIS — D631 Anemia in chronic kidney disease: Secondary | ICD-10-CM | POA: Diagnosis not present

## 2021-10-25 DIAGNOSIS — N2581 Secondary hyperparathyroidism of renal origin: Secondary | ICD-10-CM | POA: Diagnosis not present

## 2021-10-25 DIAGNOSIS — E1122 Type 2 diabetes mellitus with diabetic chronic kidney disease: Secondary | ICD-10-CM | POA: Diagnosis not present

## 2021-10-25 DIAGNOSIS — N186 End stage renal disease: Secondary | ICD-10-CM | POA: Diagnosis not present

## 2021-10-25 DIAGNOSIS — D509 Iron deficiency anemia, unspecified: Secondary | ICD-10-CM | POA: Diagnosis not present

## 2021-10-25 DIAGNOSIS — D631 Anemia in chronic kidney disease: Secondary | ICD-10-CM | POA: Diagnosis not present

## 2021-10-25 DIAGNOSIS — Z992 Dependence on renal dialysis: Secondary | ICD-10-CM | POA: Diagnosis not present

## 2021-10-25 DIAGNOSIS — N2589 Other disorders resulting from impaired renal tubular function: Secondary | ICD-10-CM | POA: Diagnosis not present

## 2021-10-25 LAB — CULTURE, BLOOD (SINGLE)
Culture: NO GROWTH
Culture: NO GROWTH
Special Requests: ADEQUATE
Special Requests: ADEQUATE

## 2021-10-25 NOTE — Progress Notes (Signed)
Subjective:    Patient ID: Nathan Ayers, male    DOB: Jan 10, 1971, 50 y.o.   MRN: [id number] Chief Complaint  Patient presents with   Follow-up    ultrasound    [name] is a 50 year old male that presents today following intervention on his left lower extremity.  The patient has a previous history of amputation of his first and fifth toes.  The fifth was 10 years ago but the first was done a week and a half ago.  The patient notes that thus far it is healing well.  He denies any purulent drainage or fevers and chills.  There are some swelling in the lower extremities but not extensive.  There are no new open wounds or ulcerations.  Today noninvasive studies show noncompressible ABIs bilaterally.  Left lower extremity has normal PPG waveforms in the second digit.  Patient has biphasic waveforms in the right lower extremity with good toe waveforms and triphasic waveforms in the left lower extremity with good perfusion in the second toe waveform.   Review of Systems  Cardiovascular:  Positive for leg swelling.  Musculoskeletal:  Positive for gait problem.  Skin:  Positive for wound.  All other systems reviewed and are negative.     Objective:   Physical Exam Vitals reviewed.  HENT:     Head: Normocephalic.  Cardiovascular:     Rate and Rhythm: Normal rate.     Pulses:          Dorsalis pedis pulses are detected w/ Doppler on the right side and detected w/ Doppler on the left side.       Posterior tibial pulses are detected w/ Doppler on the right side and detected w/ Doppler on the left side.     Comments: Nonpalpable pulses Pulmonary:     Effort: Pulmonary effort is normal.  Skin:    General: Skin is warm and dry.  Neurological:     Mental Status: He is alert and oriented to person, place, and time.  Psychiatric:        Mood and Affect: Mood normal.        Behavior: Behavior normal.        Thought Content: Thought content normal.        Judgment: Judgment normal.     BP (!) 153/72 (BP Location: Left Arm)    Pulse 80    Resp 16    Ht 6' 6"  (1.981 m)    Wt 220 lb (99.8 kg)    BMI 25.42 kg/m   Past Medical History:  Diagnosis Date   Anxiety    Chronic kidney disease (CKD), active medical management without dialysis    Mon, Wed and Fri   Chronic kidney disease on chronic dialysis ([place])    Diabetes mellitus ([place])    Diabetic neuropathy ([place])    Dyspnea    HIV infection ([place])    Hypertension     Social History   Socioeconomic History   Marital status: Single    Spouse name: Not on file   Number of children: Not on file   Years of education: Not on file   Highest education level: Not on file  Occupational History   Not on file  Tobacco Use   Smoking status: Never   Smokeless tobacco: Never  Vaping Use   Vaping Use: Never used  Substance and Sexual Activity   Alcohol use: No   Drug use: No   Sexual activity: Not Currently  Partners: Male    Birth control/protection: Condom  Other Topics Concern   Not on file  Social History Narrative   Not on file   Social Determinants of Health   Financial Resource Strain: Not on file  Food Insecurity: Not on file  Transportation Needs: Not on file  Physical Activity: Not on file  Stress: Not on file  Social Connections: Not on file  Intimate Partner Violence: Not on file    Past Surgical History:  Procedure Laterality Date   AMPUTATION Left 09/27/2021   Procedure: PARTIAL AMPUTATION FIRST RAY WITH APPLICATION OF ANTIBIOTIC BEADS;  Surgeon: Caroline More, DPM;  Location: [facility];  Service: Podiatry;  Laterality: Left;   AMPUTATION TOE Left 02/04/2017   Procedure: AMPUTATION 5TH METATARSAL AND JOINT;  Surgeon: Samara Deist, DPM;  Location: [facility];  Service: Podiatry;  Laterality: Left;   APPLICATION OF WOUND VAC Left 05/20/2017   Procedure: APPLICATION OF WOUND VAC;  Surgeon: Samara Deist, DPM;  Location: [facility];  Service: Podiatry;  Laterality: Left;   AV FISTULA PLACEMENT      BONE EXCISION Left 02/17/2018   Procedure: BONE EXCISION METATARSAL;  Surgeon: Samara Deist, DPM;  Location: [facility];  Service: Podiatry;  Laterality: Left;   INCISION AND DRAINAGE Left 09/27/2021   Procedure: INCISION AND DRAINAGE;  Surgeon: Caroline More, DPM;  Location: [facility];  Service: Podiatry;  Laterality: Left;   INCISION AND DRAINAGE Left 09/30/2021   Procedure: INCISION AND DRAINAGE;  Surgeon: Samara Deist, DPM;  Location: [facility];  Service: Podiatry;  Laterality: Left;   INCISION AND DRAINAGE OF WOUND Left 05/20/2017   Procedure: IRRIGATION AND DEBRIDEMENT WOUND;  Surgeon: Samara Deist, DPM;  Location: [facility];  Service: Podiatry;  Laterality: Left;   IR PERC TUN PERIT CATH WO PORT S&I /IMAG  09/29/2021   IR US GUIDANCE  09/29/2021   TEE WITHOUT CARDIOVERSION N/A 09/24/2021   Procedure: TRANSESOPHAGEAL ECHOCARDIOGRAM (TEE);  Surgeon: [name], MD;  Location: [facility];  Service: Cardiovascular;  Laterality: N/A;    Family History  Problem Relation Age of Onset   Diabetes Mother    Hypertension Mother     Allergies  Allergen Reactions   Daptomycin     Rhabdomyolysis    Lactose Intolerance (Gi)     CBC Latest Ref Rng & Units 10/20/2021 10/05/2021 10/04/2021  WBC 4.0 - 10.5 K/uL 9.1 9.1 9.4  Hemoglobin 13.0 - 17.0 g/dL 7.6(L) 7.6(L) 8.2(L)  Hematocrit 39.0 - 52.0 % 22.2(L) 21.8(L) 23.7(L)  Platelets 150 - 400 K/uL 246 297 343      CMP     Component Value Date/Time   NA 136 10/20/2021 1107   K 4.0 10/20/2021 1107   CL 97 (L) 10/20/2021 1107   CO2 27 10/20/2021 1107   GLUCOSE 155 (H) 10/20/2021 1107   BUN 58 (H) 10/20/2021 1107   CREATININE 13.53 (H) 10/20/2021 1107   CREATININE 1.08 07/19/2013 1631   CALCIUM 9.0 10/20/2021 1107   PROT 7.7 10/20/2021 1107   ALBUMIN 3.2 (L) 10/20/2021 1107   AST 24 10/20/2021 1107   ALT 25 10/20/2021 1107   ALKPHOS 59 10/20/2021 1107   BILITOT 0.7 10/20/2021 1107   GFRNONAA 4 (L) 10/20/2021 1107   GFRNONAA  85 07/19/2013 1631   GFRAA 12 (L) 02/10/2018 1154   GFRAA >89 07/19/2013 1631     No results found.     Assessment & Plan:   1. PAD (peripheral artery disease) (HCC) Recommend:  The patient  is status post successful angiogram with intervention.  The patient reports that the claudication symptoms and leg pain is essentially gone.   The patient denies lifestyle limiting changes at this point in time.  No further invasive studies, angiography or surgery at this time The patient should continue walking and begin a more formal exercise program.  The patient should continue antiplatelet therapy and aggressive treatment of the lipid abnormalities  Noninvasive studies today show that the patient should have adequate perfusion for wound healing.  He will continue to follow-up with podiatry for his recent toe amputations.  We will maintain close follow-up to ensure there is no regression.  Patient is advised to follow-up in 4 weeks with noninvasive studies or sooner if issues arise.  2. Essential hypertension Continue antihypertensive medications as already ordered, these medications have been reviewed and there are no changes at this time.    Current Outpatient Medications on File Prior to Visit  Medication Sig Dispense Refill   abacavir (ZIAGEN) 300 MG tablet Take 300 mg by mouth 2 (two) times daily.      amLODipine (NORVASC) 5 MG tablet Take 5 mg by mouth daily.     carvedilol (COREG) 25 MG tablet Take 25 mg by mouth 2 (two) times daily.   9   cinacalcet (SENSIPAR) 30 MG tablet Take 30 mg by mouth daily.     lamiVUDine (EPIVIR) 10 MG/ML solution Take 2.5 mLs by mouth at bedtime.   3   lanthanum (FOSRENOL) 1000 MG chewable tablet Chew 2,000 mg by mouth 3 (three) times daily.     linezolid (ZYVOX) 600 MG tablet Take 1 tablet (600 mg total) by mouth 2 (two) times daily. 40 tablet 0   losartan (COZAAR) 100 MG tablet Take 1 tablet (100 mg total) by mouth daily. 30 tablet 3   minoxidil  (LONITEN) 2.5 MG tablet Take 2.5 mg by mouth See admin instructions. TAKES TWICE DAILY, EXCEPT FOR THE AM DOSE ON DIALYSIS DAYS; MON, WED, FRI  3   Nutritional Supplements (FEEDING SUPPLEMENT, NEPRO CARB STEADY,) LIQD Take 237 mLs by mouth as needed (missed meal during dialysis.). 30 Can 0   TIVICAY 50 MG tablet Take 50 mg by mouth 2 (two) times daily.   3   doxycycline (VIBRA-TABS) 100 MG tablet Take 1 tablet (100 mg total) by mouth 2 (two) times daily. (Patient not taking: Reported on 10/20/2021) 10 tablet 0   ondansetron (ZOFRAN-ODT) 4 MG disintegrating tablet Take 4 mg by mouth every 8 (eight) hours as needed for nausea or vomiting.  (Patient not taking: Reported on 10/20/2021)     SANTYL ointment Apply 1 application topically daily. APPLIES TO FOOT USING GAUZE THEN WRAPS FOOT (Patient not taking: Reported on 10/20/2021)  2   No current facility-administered medications on file prior to visit.    There are no Patient Instructions on file for this visit. No follow-ups on file.   Kris Hartmann, NP

## 2021-10-26 ENCOUNTER — Encounter (INDEPENDENT_AMBULATORY_CARE_PROVIDER_SITE_OTHER): Payer: Self-pay | Admitting: Nurse Practitioner

## 2021-10-26 DIAGNOSIS — N186 End stage renal disease: Secondary | ICD-10-CM | POA: Diagnosis not present

## 2021-10-26 DIAGNOSIS — Z992 Dependence on renal dialysis: Secondary | ICD-10-CM | POA: Diagnosis not present

## 2021-10-26 DIAGNOSIS — N2581 Secondary hyperparathyroidism of renal origin: Secondary | ICD-10-CM | POA: Diagnosis not present

## 2021-10-26 DIAGNOSIS — N2589 Other disorders resulting from impaired renal tubular function: Secondary | ICD-10-CM | POA: Diagnosis not present

## 2021-10-26 DIAGNOSIS — E1122 Type 2 diabetes mellitus with diabetic chronic kidney disease: Secondary | ICD-10-CM | POA: Diagnosis not present

## 2021-10-26 DIAGNOSIS — B9562 Methicillin resistant Staphylococcus aureus infection as the cause of diseases classified elsewhere: Secondary | ICD-10-CM | POA: Diagnosis not present

## 2021-10-26 DIAGNOSIS — D631 Anemia in chronic kidney disease: Secondary | ICD-10-CM | POA: Diagnosis not present

## 2021-10-26 DIAGNOSIS — D509 Iron deficiency anemia, unspecified: Secondary | ICD-10-CM | POA: Diagnosis not present

## 2021-10-27 ENCOUNTER — Inpatient Hospital Stay: Payer: Medicare Other | Admitting: Infectious Diseases

## 2021-10-27 ENCOUNTER — Telehealth: Payer: Self-pay

## 2021-10-27 DIAGNOSIS — D631 Anemia in chronic kidney disease: Secondary | ICD-10-CM | POA: Diagnosis not present

## 2021-10-27 DIAGNOSIS — N2581 Secondary hyperparathyroidism of renal origin: Secondary | ICD-10-CM | POA: Diagnosis not present

## 2021-10-27 DIAGNOSIS — N2589 Other disorders resulting from impaired renal tubular function: Secondary | ICD-10-CM | POA: Diagnosis not present

## 2021-10-27 DIAGNOSIS — D509 Iron deficiency anemia, unspecified: Secondary | ICD-10-CM | POA: Diagnosis not present

## 2021-10-27 DIAGNOSIS — E1122 Type 2 diabetes mellitus with diabetic chronic kidney disease: Secondary | ICD-10-CM | POA: Diagnosis not present

## 2021-10-27 DIAGNOSIS — Z992 Dependence on renal dialysis: Secondary | ICD-10-CM | POA: Diagnosis not present

## 2021-10-27 DIAGNOSIS — N186 End stage renal disease: Secondary | ICD-10-CM | POA: Diagnosis not present

## 2021-10-27 NOTE — Telephone Encounter (Signed)
Called Same Day Surg. Scheduled 1pm  Picc Removal [facility]-spoke to [place]. Notified patient and asked him to arrive 15 min early.

## 2021-10-28 ENCOUNTER — Ambulatory Visit
Admission: RE | Admit: 2021-10-28 | Discharge: 2021-10-28 | Disposition: A | Discharge status: Home / Self Care | Payer: Medicare Other | Source: Ambulatory Visit | Attending: Interventional Radiology | Admitting: Interventional Radiology

## 2021-10-28 ENCOUNTER — Other Ambulatory Visit: Payer: Self-pay | Admitting: Interventional Radiology

## 2021-10-28 ENCOUNTER — Ambulatory Visit
Admission: RE | Admit: 2021-10-28 | Discharge: 2021-10-28 | Disposition: A | Discharge status: Home / Self Care | Payer: Medicare Other | Source: Ambulatory Visit | Attending: Infectious Diseases | Admitting: Infectious Diseases

## 2021-10-28 ENCOUNTER — Other Ambulatory Visit: Payer: Self-pay

## 2021-10-28 DIAGNOSIS — N2581 Secondary hyperparathyroidism of renal origin: Secondary | ICD-10-CM | POA: Diagnosis not present

## 2021-10-28 DIAGNOSIS — B999 Unspecified infectious disease: Secondary | ICD-10-CM | POA: Diagnosis not present

## 2021-10-28 DIAGNOSIS — E1122 Type 2 diabetes mellitus with diabetic chronic kidney disease: Secondary | ICD-10-CM | POA: Diagnosis not present

## 2021-10-28 DIAGNOSIS — D631 Anemia in chronic kidney disease: Secondary | ICD-10-CM | POA: Diagnosis not present

## 2021-10-28 DIAGNOSIS — Z452 Encounter for adjustment and management of vascular access device: Secondary | ICD-10-CM | POA: Diagnosis not present

## 2021-10-28 DIAGNOSIS — N186 End stage renal disease: Secondary | ICD-10-CM | POA: Diagnosis not present

## 2021-10-28 DIAGNOSIS — N2589 Other disorders resulting from impaired renal tubular function: Secondary | ICD-10-CM | POA: Diagnosis not present

## 2021-10-28 DIAGNOSIS — Z992 Dependence on renal dialysis: Secondary | ICD-10-CM | POA: Diagnosis not present

## 2021-10-28 DIAGNOSIS — D509 Iron deficiency anemia, unspecified: Secondary | ICD-10-CM | POA: Diagnosis not present

## 2021-10-28 HISTORY — PX: IR REMOVAL TUN CV CATH W/O FL: IMG2289

## 2021-10-28 NOTE — Progress Notes (Signed)
Patient in to have PICC line removed.  PICC located in left subclavian, Sutures removed from flange and around catheter.  Severe resistance encountered when trying to pull catheter back.  When palpating the skin around the catheter insertion site it feels as though the catheter is kinked back over itself.  Notified Eulogio Ditch by secure chat.  She is reaching out to a radiology tech from the vascular department to come and assess.

## 2021-10-29 DIAGNOSIS — N2581 Secondary hyperparathyroidism of renal origin: Secondary | ICD-10-CM | POA: Diagnosis not present

## 2021-10-29 DIAGNOSIS — D509 Iron deficiency anemia, unspecified: Secondary | ICD-10-CM | POA: Diagnosis not present

## 2021-10-29 DIAGNOSIS — Z992 Dependence on renal dialysis: Secondary | ICD-10-CM | POA: Diagnosis not present

## 2021-10-29 DIAGNOSIS — N2589 Other disorders resulting from impaired renal tubular function: Secondary | ICD-10-CM | POA: Diagnosis not present

## 2021-10-29 DIAGNOSIS — E1122 Type 2 diabetes mellitus with diabetic chronic kidney disease: Secondary | ICD-10-CM | POA: Diagnosis not present

## 2021-10-29 DIAGNOSIS — N186 End stage renal disease: Secondary | ICD-10-CM | POA: Diagnosis not present

## 2021-10-29 DIAGNOSIS — D631 Anemia in chronic kidney disease: Secondary | ICD-10-CM | POA: Diagnosis not present

## 2021-10-30 DIAGNOSIS — D631 Anemia in chronic kidney disease: Secondary | ICD-10-CM | POA: Diagnosis not present

## 2021-10-30 DIAGNOSIS — N186 End stage renal disease: Secondary | ICD-10-CM | POA: Diagnosis not present

## 2021-10-30 DIAGNOSIS — E1122 Type 2 diabetes mellitus with diabetic chronic kidney disease: Secondary | ICD-10-CM | POA: Diagnosis not present

## 2021-10-30 DIAGNOSIS — N2589 Other disorders resulting from impaired renal tubular function: Secondary | ICD-10-CM | POA: Diagnosis not present

## 2021-10-30 DIAGNOSIS — D509 Iron deficiency anemia, unspecified: Secondary | ICD-10-CM | POA: Diagnosis not present

## 2021-10-30 DIAGNOSIS — Z992 Dependence on renal dialysis: Secondary | ICD-10-CM | POA: Diagnosis not present

## 2021-10-30 DIAGNOSIS — N2581 Secondary hyperparathyroidism of renal origin: Secondary | ICD-10-CM | POA: Diagnosis not present

## 2021-10-31 DIAGNOSIS — Z992 Dependence on renal dialysis: Secondary | ICD-10-CM | POA: Diagnosis not present

## 2021-10-31 DIAGNOSIS — D631 Anemia in chronic kidney disease: Secondary | ICD-10-CM | POA: Diagnosis not present

## 2021-10-31 DIAGNOSIS — N2589 Other disorders resulting from impaired renal tubular function: Secondary | ICD-10-CM | POA: Diagnosis not present

## 2021-10-31 DIAGNOSIS — N2581 Secondary hyperparathyroidism of renal origin: Secondary | ICD-10-CM | POA: Diagnosis not present

## 2021-10-31 DIAGNOSIS — D509 Iron deficiency anemia, unspecified: Secondary | ICD-10-CM | POA: Diagnosis not present

## 2021-10-31 DIAGNOSIS — E1122 Type 2 diabetes mellitus with diabetic chronic kidney disease: Secondary | ICD-10-CM | POA: Diagnosis not present

## 2021-10-31 DIAGNOSIS — N186 End stage renal disease: Secondary | ICD-10-CM | POA: Diagnosis not present

## 2021-11-01 DIAGNOSIS — N2589 Other disorders resulting from impaired renal tubular function: Secondary | ICD-10-CM | POA: Diagnosis not present

## 2021-11-01 DIAGNOSIS — N2581 Secondary hyperparathyroidism of renal origin: Secondary | ICD-10-CM | POA: Diagnosis not present

## 2021-11-01 DIAGNOSIS — E1122 Type 2 diabetes mellitus with diabetic chronic kidney disease: Secondary | ICD-10-CM | POA: Diagnosis not present

## 2021-11-01 DIAGNOSIS — D631 Anemia in chronic kidney disease: Secondary | ICD-10-CM | POA: Diagnosis not present

## 2021-11-01 DIAGNOSIS — N186 End stage renal disease: Secondary | ICD-10-CM | POA: Diagnosis not present

## 2021-11-01 DIAGNOSIS — D509 Iron deficiency anemia, unspecified: Secondary | ICD-10-CM | POA: Diagnosis not present

## 2021-11-01 DIAGNOSIS — Z992 Dependence on renal dialysis: Secondary | ICD-10-CM | POA: Diagnosis not present

## 2021-11-02 DIAGNOSIS — N2589 Other disorders resulting from impaired renal tubular function: Secondary | ICD-10-CM | POA: Diagnosis not present

## 2021-11-02 DIAGNOSIS — E1122 Type 2 diabetes mellitus with diabetic chronic kidney disease: Secondary | ICD-10-CM | POA: Diagnosis not present

## 2021-11-02 DIAGNOSIS — D631 Anemia in chronic kidney disease: Secondary | ICD-10-CM | POA: Diagnosis not present

## 2021-11-02 DIAGNOSIS — N2581 Secondary hyperparathyroidism of renal origin: Secondary | ICD-10-CM | POA: Diagnosis not present

## 2021-11-02 DIAGNOSIS — Z992 Dependence on renal dialysis: Secondary | ICD-10-CM | POA: Diagnosis not present

## 2021-11-02 DIAGNOSIS — D509 Iron deficiency anemia, unspecified: Secondary | ICD-10-CM | POA: Diagnosis not present

## 2021-11-02 DIAGNOSIS — N186 End stage renal disease: Secondary | ICD-10-CM | POA: Diagnosis not present

## 2021-11-03 ENCOUNTER — Other Ambulatory Visit: Payer: Self-pay

## 2021-11-03 ENCOUNTER — Telehealth: Payer: Self-pay

## 2021-11-03 DIAGNOSIS — R7881 Bacteremia: Secondary | ICD-10-CM

## 2021-11-03 DIAGNOSIS — D509 Iron deficiency anemia, unspecified: Secondary | ICD-10-CM | POA: Diagnosis not present

## 2021-11-03 DIAGNOSIS — D631 Anemia in chronic kidney disease: Secondary | ICD-10-CM | POA: Diagnosis not present

## 2021-11-03 DIAGNOSIS — Z992 Dependence on renal dialysis: Secondary | ICD-10-CM | POA: Diagnosis not present

## 2021-11-03 DIAGNOSIS — E1122 Type 2 diabetes mellitus with diabetic chronic kidney disease: Secondary | ICD-10-CM | POA: Diagnosis not present

## 2021-11-03 DIAGNOSIS — N2589 Other disorders resulting from impaired renal tubular function: Secondary | ICD-10-CM | POA: Diagnosis not present

## 2021-11-03 DIAGNOSIS — N186 End stage renal disease: Secondary | ICD-10-CM | POA: Diagnosis not present

## 2021-11-03 DIAGNOSIS — N2581 Secondary hyperparathyroidism of renal origin: Secondary | ICD-10-CM | POA: Diagnosis not present

## 2021-11-03 NOTE — Telephone Encounter (Signed)
Called patient from [facility] Friday and discussed his IR appt. They did remove line for Abx and he is doing great!

## 2021-11-03 NOTE — Telephone Encounter (Signed)
Called patient who reports he will finish oral Abx Wed. I have scheduled him a video visit for 11/05/2021 due to transportation issues.  Patient also advised to go to [place] and get labs drawn (ordered CBC/CRP/ESR) . He is concerned with transportation but will go as soon as possible.

## 2021-11-04 DIAGNOSIS — D631 Anemia in chronic kidney disease: Secondary | ICD-10-CM | POA: Diagnosis not present

## 2021-11-04 DIAGNOSIS — N2589 Other disorders resulting from impaired renal tubular function: Secondary | ICD-10-CM | POA: Diagnosis not present

## 2021-11-04 DIAGNOSIS — N2581 Secondary hyperparathyroidism of renal origin: Secondary | ICD-10-CM | POA: Diagnosis not present

## 2021-11-04 DIAGNOSIS — Z992 Dependence on renal dialysis: Secondary | ICD-10-CM | POA: Diagnosis not present

## 2021-11-04 DIAGNOSIS — D509 Iron deficiency anemia, unspecified: Secondary | ICD-10-CM | POA: Diagnosis not present

## 2021-11-04 DIAGNOSIS — N186 End stage renal disease: Secondary | ICD-10-CM | POA: Diagnosis not present

## 2021-11-04 DIAGNOSIS — E1122 Type 2 diabetes mellitus with diabetic chronic kidney disease: Secondary | ICD-10-CM | POA: Diagnosis not present

## 2021-11-05 ENCOUNTER — Other Ambulatory Visit: Payer: Self-pay

## 2021-11-05 ENCOUNTER — Ambulatory Visit: Payer: Medicare Other | Attending: Infectious Diseases | Admitting: Infectious Diseases

## 2021-11-05 ENCOUNTER — Telehealth: Payer: Self-pay

## 2021-11-05 DIAGNOSIS — I120 Hypertensive chronic kidney disease with stage 5 chronic kidney disease or end stage renal disease: Secondary | ICD-10-CM | POA: Diagnosis not present

## 2021-11-05 DIAGNOSIS — Z21 Asymptomatic human immunodeficiency virus [HIV] infection status: Secondary | ICD-10-CM | POA: Insufficient documentation

## 2021-11-05 DIAGNOSIS — M868X7 Other osteomyelitis, ankle and foot: Secondary | ICD-10-CM | POA: Diagnosis not present

## 2021-11-05 DIAGNOSIS — L089 Local infection of the skin and subcutaneous tissue, unspecified: Secondary | ICD-10-CM

## 2021-11-05 DIAGNOSIS — R7881 Bacteremia: Secondary | ICD-10-CM | POA: Insufficient documentation

## 2021-11-05 DIAGNOSIS — N2581 Secondary hyperparathyroidism of renal origin: Secondary | ICD-10-CM | POA: Diagnosis not present

## 2021-11-05 DIAGNOSIS — D509 Iron deficiency anemia, unspecified: Secondary | ICD-10-CM | POA: Diagnosis not present

## 2021-11-05 DIAGNOSIS — Z992 Dependence on renal dialysis: Secondary | ICD-10-CM | POA: Insufficient documentation

## 2021-11-05 DIAGNOSIS — E1140 Type 2 diabetes mellitus with diabetic neuropathy, unspecified: Secondary | ICD-10-CM | POA: Insufficient documentation

## 2021-11-05 DIAGNOSIS — Z89422 Acquired absence of other left toe(s): Secondary | ICD-10-CM | POA: Diagnosis not present

## 2021-11-05 DIAGNOSIS — E1122 Type 2 diabetes mellitus with diabetic chronic kidney disease: Secondary | ICD-10-CM | POA: Insufficient documentation

## 2021-11-05 DIAGNOSIS — N186 End stage renal disease: Secondary | ICD-10-CM | POA: Insufficient documentation

## 2021-11-05 DIAGNOSIS — Z79899 Other long term (current) drug therapy: Secondary | ICD-10-CM | POA: Diagnosis not present

## 2021-11-05 DIAGNOSIS — B9562 Methicillin resistant Staphylococcus aureus infection as the cause of diseases classified elsewhere: Secondary | ICD-10-CM | POA: Insufficient documentation

## 2021-11-05 DIAGNOSIS — D631 Anemia in chronic kidney disease: Secondary | ICD-10-CM | POA: Insufficient documentation

## 2021-11-05 DIAGNOSIS — E11628 Type 2 diabetes mellitus with other skin complications: Secondary | ICD-10-CM | POA: Diagnosis not present

## 2021-11-05 DIAGNOSIS — N2589 Other disorders resulting from impaired renal tubular function: Secondary | ICD-10-CM | POA: Diagnosis not present

## 2021-11-05 NOTE — Telephone Encounter (Signed)
Called nephrology for Dr Toni Arthurs and had to leave message with front desk to have Dr Toni Arthurs call Dr Delaine Lame regarding this patient. I did also explain it is important. I gave them cell phone info for Dr Delaine Lame and they will give the provider this message.

## 2021-11-05 NOTE — Progress Notes (Signed)
NAME: Nathan Ayers  DOB: [birth year]  MRN: [id number]  Date/Time: 11/05/2021 9:27 AM   Subjective:   ?The purpose of this virtual visit is to provide medical care while limiting exposure to the novel coronavirus (COVID19) for both patient and office staff.   Consent was obtained for phone visit:  Yes.   Answered questions that patient had about telehealth interaction:  Yes.   I discussed the limitations, risks, security and privacy concerns of performing an evaluation and management service by telephone. I also discussed with the patient that there may be a patient responsible charge related to this service. The patient expressed understanding and agreed to proceed.   Patient Location: Home Provider Location: office People on the call : patient, CMA, MD   Nathan Ayers is a 50 y.o. male with a history of diabetes mellitus, end-stage renal disease on peritoneal dialysis, hypertension, HIV, is currently being treated for MRSA bacteremia secondary to MRSA foot infection left Patient was hospitalized in  [facility] between 09/22/2021 and 10/05/2021 for fever and chills.  Found to have MRSA bacteremia.  Initially the source was unclear until it revealed itself by purulent discharge from the left foot. He underwent first ray amputation on 09/27/2021 by Dr. Vickki Muff.  He had to undergo repeat I/D on 09/30/2021.  He also had TEE which was negative for endocarditis.  Repeat blood culture was negative for MRSA. A central line was placed and he was sent home on IV daptomycin. The daptomycin was every 48 hours. CPK was monitored weekly and it was increasing from 46 and it went up to 558.  We tried to reach the patient multiple times from 10/29/2021 until 10/31/2021.  After multiple messages were left patient called back saying that he had discontinued the daptomycin on 10/11/2021.  A prescription for linezolid was sent on 10/14/2021.  But it was not available for him until 10/16/2021.  He has been taking linezolid  since then.  I saw him on 10/20/21 and he was doing fine- sent blood culture that day and it came back negative. HE saw Dr.Fowler orthopedic on the same day and he took xray of the foot which showed partial first and fifth ray amputatiton- The surgical wound had healed completely . He says he has nausea and bloating and thinks It is due to linezolid- When I asked why he did not call he said it will finish today. HE has not got his blood work since starting linezolid - CBC was ordered but he has not gone because of transportation issue - no vomiting No fever  He got some medicine for low hb at the dialysis center this week Past Medical History:  Diagnosis Date   Anxiety    Chronic kidney disease (CKD), active medical management without dialysis    Mon, Wed and Fri   Chronic kidney disease on chronic dialysis ([place])    Diabetes mellitus ([place])    Diabetic neuropathy ([place])    Dyspnea    HIV infection ([place])    Hypertension     Past Surgical History:  Procedure Laterality Date   AMPUTATION Left 09/27/2021   Procedure: PARTIAL AMPUTATION FIRST RAY WITH APPLICATION OF ANTIBIOTIC BEADS;  Surgeon: Caroline More, DPM;  Location: [facility];  Service: Podiatry;  Laterality: Left;   AMPUTATION TOE Left 02/04/2017   Procedure: AMPUTATION 5TH METATARSAL AND JOINT;  Surgeon: Samara Deist, DPM;  Location: [facility];  Service: Podiatry;  Laterality: Left;   APPLICATION OF WOUND VAC Left 05/20/2017  Procedure: APPLICATION OF WOUND VAC;  Surgeon: Samara Deist, DPM;  Location: [facility];  Service: Podiatry;  Laterality: Left;   AV FISTULA PLACEMENT     BONE EXCISION Left 02/17/2018   Procedure: BONE EXCISION METATARSAL;  Surgeon: Samara Deist, DPM;  Location: [facility];  Service: Podiatry;  Laterality: Left;   INCISION AND DRAINAGE Left 09/27/2021   Procedure: INCISION AND DRAINAGE;  Surgeon: Caroline More, DPM;  Location: [facility];  Service: Podiatry;  Laterality: Left;   INCISION AND DRAINAGE Left 09/30/2021    Procedure: INCISION AND DRAINAGE;  Surgeon: Samara Deist, DPM;  Location: [facility];  Service: Podiatry;  Laterality: Left;   INCISION AND DRAINAGE OF WOUND Left 05/20/2017   Procedure: IRRIGATION AND DEBRIDEMENT WOUND;  Surgeon: Samara Deist, DPM;  Location: [facility];  Service: Podiatry;  Laterality: Left;   IR PERC TUN PERIT CATH WO PORT S&I /IMAG  09/29/2021   IR REMOVAL TUN CV CATH W/O FL  10/28/2021   IR US GUIDANCE  09/29/2021   TEE WITHOUT CARDIOVERSION N/A 09/24/2021   Procedure: TRANSESOPHAGEAL ECHOCARDIOGRAM (TEE);  Surgeon: [name], MD;  Location: [facility];  Service: Cardiovascular;  Laterality: N/A;    Social History   Socioeconomic History   Marital status: Single    Spouse name: Not on file   Number of children: Not on file   Years of education: Not on file   Highest education level: Not on file  Occupational History   Not on file  Tobacco Use   Smoking status: Never   Smokeless tobacco: Never  Vaping Use   Vaping Use: Never used  Substance and Sexual Activity   Alcohol use: No   Drug use: No   Sexual activity: Not Currently    Partners: Male    Birth control/protection: Condom  Other Topics Concern   Not on file  Social History Narrative   Not on file   Social Determinants of Health   Financial Resource Strain: Not on file  Food Insecurity: Not on file  Transportation Needs: Not on file  Physical Activity: Not on file  Stress: Not on file  Social Connections: Not on file  Intimate Partner Violence: Not on file    Family History  Problem Relation Age of Onset   Diabetes Mother    Hypertension Mother    Allergies  Allergen Reactions   Daptomycin     Rhabdomyolysis    Lactose Intolerance (Gi)    I? Current Outpatient Medications  Medication Sig Dispense Refill   Methoxy PEG-Epoetin Beta (MIRCERA IJ) Inject into the skin.     abacavir (ZIAGEN) 300 MG tablet Take 300 mg by mouth 2 (two) times daily.      amLODipine (NORVASC) 5 MG  tablet Take 5 mg by mouth daily.     carvedilol (COREG) 25 MG tablet Take 25 mg by mouth 2 (two) times daily.   9   cinacalcet (SENSIPAR) 30 MG tablet Take 30 mg by mouth daily.     doxycycline (VIBRA-TABS) 100 MG tablet Take 1 tablet (100 mg total) by mouth 2 (two) times daily. (Patient not taking: Reported on 10/20/2021) 10 tablet 0   lamiVUDine (EPIVIR) 10 MG/ML solution Take 2.5 mLs by mouth at bedtime.   3   lanthanum (FOSRENOL) 1000 MG chewable tablet Chew 2,000 mg by mouth 3 (three) times daily.     linezolid (ZYVOX) 600 MG tablet Take 1 tablet (600 mg total) by mouth 2 (two) times daily. 40 tablet 0  losartan (COZAAR) 100 MG tablet Take 1 tablet (100 mg total) by mouth daily. 30 tablet 3   minoxidil (LONITEN) 2.5 MG tablet Take 2.5 mg by mouth See admin instructions. TAKES TWICE DAILY, EXCEPT FOR THE AM DOSE ON DIALYSIS DAYS; MON, WED, FRI  3   Nutritional Supplements (FEEDING SUPPLEMENT, NEPRO CARB STEADY,) LIQD Take 237 mLs by mouth as needed (missed meal during dialysis.). 30 Can 0   omeprazole (PRILOSEC) 20 MG capsule Take 20 mg by mouth daily.     ondansetron (ZOFRAN-ODT) 4 MG disintegrating tablet Take 4 mg by mouth every 8 (eight) hours as needed for nausea or vomiting.  (Patient not taking: Reported on 10/20/2021)     SANTYL ointment Apply 1 application topically daily. APPLIES TO FOOT USING GAUZE THEN WRAPS FOOT (Patient not taking: Reported on 10/20/2021)  2   TIVICAY 50 MG tablet Take 50 mg by mouth 2 (two) times daily.   3   No current facility-administered medications for this visit.     Abtx:  Anti-infectives (From admission, onward)    None       REVIEW OF SYSTEMS:  Const: negative fever, negative chills, negative weight loss, has fatigue and feels tired Eyes: negative diplopia or visual changes, negative eye pain ENT: negative coryza, negative sore throat Resp: negative cough, hemoptysis, dyspnea Cards: negative for chest pain, palpitations, lower extremity  edema GU: negative for frequency, dysuria and hematuria GI: Negative for abdominal pain, diarrhea, bleeding, constipation Skin: negative for rash and pruritus Heme: negative for easy bruising and gum/nose bleeding MS: fatigue Neurolo:negative for headaches, dizziness, vertigo, memory problems  Psych: negative for feelings of anxiety, depression  Endocrine: diabetes Allergy/Immunology as above Objective:  VITALS:  There were no vitals taken for this visit.  PHYSICAL EXAM:  He was alert on the video visit- He was doing peritoneal dialysis and hence I could not see hi left foot His lips were pale Rest of the systems- RS, CVS, Abd not done due to the type of visit Extremities not examined  Pertinent Labs Lab Results 10/20/21 [facility]    ? Impression/recommendation ? MRSA bacteremia.  Secondary to MRSA foot infection with osteomyelitis Status post first toe ray amputation and I&D of the abscess Patient was supposed to get 6 weeks of IV antibiotics and would  finish on 11/01/2021..  But there was interruption in his treatment.  Initially it was rhabdomyolysis caused by daptomycin and that was stopped on 10/11/21.  Linezolid prescription was sent but there was a 3 day delay  in collecting the medication and he started on 10/16/21  for 3 weeks Blood culture was done on 12/13 and was negative He was supposed to get CBC last week but he has not- His last HB was on 12/13 at the dialysis center on 12/13 and it was 8.8 and he got Micera( Peg Epotein) He will get labs today  End-stage renal disease on peritoneal dialysis.  I doubt that he is getting adequately dialyzed because his creatinine has been always been > 12. I have left a message for Dr.Voohra his Nephrologist [phone]  Anemia combination of end-stage renal disease and infection.  HIV well-controlled on abacavir, Epivir and Tivicay and [facility]  Hypertension on amlodipine, carvedilol and losartan  Pt will do labs  today He will complete linezolid today Currently does not need any more antibiotics May check a blood culture 2 weeks later  ___________________________________________________ Discussed with patient in great detail.  Total time spent on this  video call was 18 min

## 2021-11-06 ENCOUNTER — Encounter: Payer: Self-pay | Admitting: Infectious Diseases

## 2021-11-06 DIAGNOSIS — N2589 Other disorders resulting from impaired renal tubular function: Secondary | ICD-10-CM | POA: Diagnosis not present

## 2021-11-06 DIAGNOSIS — N186 End stage renal disease: Secondary | ICD-10-CM | POA: Diagnosis not present

## 2021-11-06 DIAGNOSIS — Z992 Dependence on renal dialysis: Secondary | ICD-10-CM | POA: Diagnosis not present

## 2021-11-06 DIAGNOSIS — D509 Iron deficiency anemia, unspecified: Secondary | ICD-10-CM | POA: Diagnosis not present

## 2021-11-06 DIAGNOSIS — N2581 Secondary hyperparathyroidism of renal origin: Secondary | ICD-10-CM | POA: Diagnosis not present

## 2021-11-06 DIAGNOSIS — E1122 Type 2 diabetes mellitus with diabetic chronic kidney disease: Secondary | ICD-10-CM | POA: Diagnosis not present

## 2021-11-06 DIAGNOSIS — D631 Anemia in chronic kidney disease: Secondary | ICD-10-CM | POA: Diagnosis not present

## 2021-11-07 DIAGNOSIS — Z992 Dependence on renal dialysis: Secondary | ICD-10-CM | POA: Diagnosis not present

## 2021-11-07 DIAGNOSIS — N2581 Secondary hyperparathyroidism of renal origin: Secondary | ICD-10-CM | POA: Diagnosis not present

## 2021-11-07 DIAGNOSIS — N2589 Other disorders resulting from impaired renal tubular function: Secondary | ICD-10-CM | POA: Diagnosis not present

## 2021-11-07 DIAGNOSIS — D509 Iron deficiency anemia, unspecified: Secondary | ICD-10-CM | POA: Diagnosis not present

## 2021-11-07 DIAGNOSIS — D631 Anemia in chronic kidney disease: Secondary | ICD-10-CM | POA: Diagnosis not present

## 2021-11-07 DIAGNOSIS — E1122 Type 2 diabetes mellitus with diabetic chronic kidney disease: Secondary | ICD-10-CM | POA: Diagnosis not present

## 2021-11-07 DIAGNOSIS — N186 End stage renal disease: Secondary | ICD-10-CM | POA: Diagnosis not present

## 2021-11-08 DIAGNOSIS — D631 Anemia in chronic kidney disease: Secondary | ICD-10-CM | POA: Diagnosis not present

## 2021-11-08 DIAGNOSIS — Z79899 Other long term (current) drug therapy: Secondary | ICD-10-CM | POA: Diagnosis not present

## 2021-11-08 DIAGNOSIS — Z992 Dependence on renal dialysis: Secondary | ICD-10-CM | POA: Diagnosis not present

## 2021-11-08 DIAGNOSIS — N2581 Secondary hyperparathyroidism of renal origin: Secondary | ICD-10-CM | POA: Diagnosis not present

## 2021-11-08 DIAGNOSIS — E440 Moderate protein-calorie malnutrition: Secondary | ICD-10-CM | POA: Diagnosis not present

## 2021-11-08 DIAGNOSIS — N2589 Other disorders resulting from impaired renal tubular function: Secondary | ICD-10-CM | POA: Diagnosis not present

## 2021-11-08 DIAGNOSIS — N186 End stage renal disease: Secondary | ICD-10-CM | POA: Diagnosis not present

## 2021-11-08 DIAGNOSIS — R17 Unspecified jaundice: Secondary | ICD-10-CM | POA: Diagnosis not present

## 2021-11-08 DIAGNOSIS — D509 Iron deficiency anemia, unspecified: Secondary | ICD-10-CM | POA: Diagnosis not present

## 2021-11-09 DIAGNOSIS — D631 Anemia in chronic kidney disease: Secondary | ICD-10-CM | POA: Diagnosis not present

## 2021-11-09 DIAGNOSIS — N2589 Other disorders resulting from impaired renal tubular function: Secondary | ICD-10-CM | POA: Diagnosis not present

## 2021-11-09 DIAGNOSIS — R17 Unspecified jaundice: Secondary | ICD-10-CM | POA: Diagnosis not present

## 2021-11-09 DIAGNOSIS — N186 End stage renal disease: Secondary | ICD-10-CM | POA: Diagnosis not present

## 2021-11-09 DIAGNOSIS — Z992 Dependence on renal dialysis: Secondary | ICD-10-CM | POA: Diagnosis not present

## 2021-11-09 DIAGNOSIS — E440 Moderate protein-calorie malnutrition: Secondary | ICD-10-CM | POA: Diagnosis not present

## 2021-11-09 DIAGNOSIS — D509 Iron deficiency anemia, unspecified: Secondary | ICD-10-CM | POA: Diagnosis not present

## 2021-11-09 DIAGNOSIS — N2581 Secondary hyperparathyroidism of renal origin: Secondary | ICD-10-CM | POA: Diagnosis not present

## 2021-11-09 DIAGNOSIS — Z79899 Other long term (current) drug therapy: Secondary | ICD-10-CM | POA: Diagnosis not present

## 2021-11-10 ENCOUNTER — Other Ambulatory Visit: Payer: Self-pay

## 2021-11-10 DIAGNOSIS — D509 Iron deficiency anemia, unspecified: Secondary | ICD-10-CM | POA: Diagnosis not present

## 2021-11-10 DIAGNOSIS — N2589 Other disorders resulting from impaired renal tubular function: Secondary | ICD-10-CM | POA: Diagnosis not present

## 2021-11-10 DIAGNOSIS — Z79899 Other long term (current) drug therapy: Secondary | ICD-10-CM | POA: Diagnosis not present

## 2021-11-10 DIAGNOSIS — N2581 Secondary hyperparathyroidism of renal origin: Secondary | ICD-10-CM | POA: Diagnosis not present

## 2021-11-10 DIAGNOSIS — R7881 Bacteremia: Secondary | ICD-10-CM

## 2021-11-10 DIAGNOSIS — N186 End stage renal disease: Secondary | ICD-10-CM | POA: Diagnosis not present

## 2021-11-10 DIAGNOSIS — R17 Unspecified jaundice: Secondary | ICD-10-CM | POA: Diagnosis not present

## 2021-11-10 DIAGNOSIS — D631 Anemia in chronic kidney disease: Secondary | ICD-10-CM | POA: Diagnosis not present

## 2021-11-10 DIAGNOSIS — B9562 Methicillin resistant Staphylococcus aureus infection as the cause of diseases classified elsewhere: Secondary | ICD-10-CM

## 2021-11-10 DIAGNOSIS — E440 Moderate protein-calorie malnutrition: Secondary | ICD-10-CM | POA: Diagnosis not present

## 2021-11-10 DIAGNOSIS — Z992 Dependence on renal dialysis: Secondary | ICD-10-CM | POA: Diagnosis not present

## 2021-11-11 ENCOUNTER — Telehealth: Payer: Self-pay

## 2021-11-11 DIAGNOSIS — N2581 Secondary hyperparathyroidism of renal origin: Secondary | ICD-10-CM | POA: Diagnosis not present

## 2021-11-11 DIAGNOSIS — Z79899 Other long term (current) drug therapy: Secondary | ICD-10-CM | POA: Diagnosis not present

## 2021-11-11 DIAGNOSIS — D509 Iron deficiency anemia, unspecified: Secondary | ICD-10-CM | POA: Diagnosis not present

## 2021-11-11 DIAGNOSIS — N186 End stage renal disease: Secondary | ICD-10-CM | POA: Diagnosis not present

## 2021-11-11 DIAGNOSIS — E440 Moderate protein-calorie malnutrition: Secondary | ICD-10-CM | POA: Diagnosis not present

## 2021-11-11 DIAGNOSIS — Z992 Dependence on renal dialysis: Secondary | ICD-10-CM | POA: Diagnosis not present

## 2021-11-11 DIAGNOSIS — D631 Anemia in chronic kidney disease: Secondary | ICD-10-CM | POA: Diagnosis not present

## 2021-11-11 DIAGNOSIS — R17 Unspecified jaundice: Secondary | ICD-10-CM | POA: Diagnosis not present

## 2021-11-11 DIAGNOSIS — N2589 Other disorders resulting from impaired renal tubular function: Secondary | ICD-10-CM | POA: Diagnosis not present

## 2021-11-11 NOTE — Telephone Encounter (Signed)
Error

## 2021-11-11 NOTE — Telephone Encounter (Signed)
Patient called to state he will get labs on Thur 11/12/2021 lab order has been faxed and we will not do blood cultures per Dr Delaine Lame until 2 more weeks which has been explained to patient.

## 2021-11-12 DIAGNOSIS — E440 Moderate protein-calorie malnutrition: Secondary | ICD-10-CM | POA: Diagnosis not present

## 2021-11-12 DIAGNOSIS — Z992 Dependence on renal dialysis: Secondary | ICD-10-CM | POA: Diagnosis not present

## 2021-11-12 DIAGNOSIS — R7881 Bacteremia: Secondary | ICD-10-CM | POA: Diagnosis not present

## 2021-11-12 DIAGNOSIS — N186 End stage renal disease: Secondary | ICD-10-CM | POA: Diagnosis not present

## 2021-11-12 DIAGNOSIS — Z79899 Other long term (current) drug therapy: Secondary | ICD-10-CM | POA: Diagnosis not present

## 2021-11-12 DIAGNOSIS — N2589 Other disorders resulting from impaired renal tubular function: Secondary | ICD-10-CM | POA: Diagnosis not present

## 2021-11-12 DIAGNOSIS — B9562 Methicillin resistant Staphylococcus aureus infection as the cause of diseases classified elsewhere: Secondary | ICD-10-CM | POA: Diagnosis not present

## 2021-11-12 DIAGNOSIS — D509 Iron deficiency anemia, unspecified: Secondary | ICD-10-CM | POA: Diagnosis not present

## 2021-11-12 DIAGNOSIS — N2581 Secondary hyperparathyroidism of renal origin: Secondary | ICD-10-CM | POA: Diagnosis not present

## 2021-11-12 DIAGNOSIS — R17 Unspecified jaundice: Secondary | ICD-10-CM | POA: Diagnosis not present

## 2021-11-12 DIAGNOSIS — D631 Anemia in chronic kidney disease: Secondary | ICD-10-CM | POA: Diagnosis not present

## 2021-11-13 DIAGNOSIS — N2581 Secondary hyperparathyroidism of renal origin: Secondary | ICD-10-CM | POA: Diagnosis not present

## 2021-11-13 DIAGNOSIS — Z79899 Other long term (current) drug therapy: Secondary | ICD-10-CM | POA: Diagnosis not present

## 2021-11-13 DIAGNOSIS — N186 End stage renal disease: Secondary | ICD-10-CM | POA: Diagnosis not present

## 2021-11-13 DIAGNOSIS — Z992 Dependence on renal dialysis: Secondary | ICD-10-CM | POA: Diagnosis not present

## 2021-11-13 DIAGNOSIS — D631 Anemia in chronic kidney disease: Secondary | ICD-10-CM | POA: Diagnosis not present

## 2021-11-13 DIAGNOSIS — D509 Iron deficiency anemia, unspecified: Secondary | ICD-10-CM | POA: Diagnosis not present

## 2021-11-13 DIAGNOSIS — E440 Moderate protein-calorie malnutrition: Secondary | ICD-10-CM | POA: Diagnosis not present

## 2021-11-13 DIAGNOSIS — R17 Unspecified jaundice: Secondary | ICD-10-CM | POA: Diagnosis not present

## 2021-11-13 DIAGNOSIS — N2589 Other disorders resulting from impaired renal tubular function: Secondary | ICD-10-CM | POA: Diagnosis not present

## 2021-11-13 LAB — CBC WITH DIFFERENTIAL/PLATELET
Basophils Absolute: 0.1 10*3/uL (ref 0.0–0.2)
Basos: 1 %
EOS (ABSOLUTE): 0.4 10*3/uL (ref 0.0–0.4)
Eos: 2 %
Hematocrit: 20.9 % — ABNORMAL LOW (ref 37.5–51.0)
Hemoglobin: 7 g/dL — CL (ref 13.0–17.7)
Immature Grans (Abs): 0.8 10*3/uL — ABNORMAL HIGH (ref 0.0–0.1)
Immature Granulocytes: 5 %
Lymphocytes Absolute: 4.2 10*3/uL — ABNORMAL HIGH (ref 0.7–3.1)
Lymphs: 29 %
MCH: 28.5 pg (ref 26.6–33.0)
MCHC: 33.5 g/dL (ref 31.5–35.7)
MCV: 85 fL (ref 79–97)
Monocytes Absolute: 1.6 10*3/uL — ABNORMAL HIGH (ref 0.1–0.9)
Monocytes: 11 %
NRBC: 1 % — ABNORMAL HIGH (ref 0–0)
Neutrophils Absolute: 7.7 10*3/uL — ABNORMAL HIGH (ref 1.4–7.0)
Neutrophils: 52 %
Platelets: 176 10*3/uL (ref 150–450)
RBC: 2.46 x10E6/uL — CL (ref 4.14–5.80)
RDW: 15.5 % — ABNORMAL HIGH (ref 11.6–15.4)
WBC: 14.8 10*3/uL — ABNORMAL HIGH (ref 3.4–10.8)

## 2021-11-13 LAB — C-REACTIVE PROTEIN: CRP: 2 mg/L (ref 0–10)

## 2021-11-13 LAB — SEDIMENTATION RATE: Sed Rate: 11 mm/hr (ref 0–15)

## 2021-11-13 NOTE — Progress Notes (Signed)
Advised via detailed vm on private cell that addresses patient. Also given permission to leave on vm. Sent mychart results as well.

## 2021-11-14 DIAGNOSIS — N186 End stage renal disease: Secondary | ICD-10-CM | POA: Diagnosis not present

## 2021-11-14 DIAGNOSIS — Z992 Dependence on renal dialysis: Secondary | ICD-10-CM | POA: Diagnosis not present

## 2021-11-14 DIAGNOSIS — D509 Iron deficiency anemia, unspecified: Secondary | ICD-10-CM | POA: Diagnosis not present

## 2021-11-14 DIAGNOSIS — Z79899 Other long term (current) drug therapy: Secondary | ICD-10-CM | POA: Diagnosis not present

## 2021-11-14 DIAGNOSIS — N2589 Other disorders resulting from impaired renal tubular function: Secondary | ICD-10-CM | POA: Diagnosis not present

## 2021-11-14 DIAGNOSIS — E440 Moderate protein-calorie malnutrition: Secondary | ICD-10-CM | POA: Diagnosis not present

## 2021-11-14 DIAGNOSIS — D631 Anemia in chronic kidney disease: Secondary | ICD-10-CM | POA: Diagnosis not present

## 2021-11-14 DIAGNOSIS — N2581 Secondary hyperparathyroidism of renal origin: Secondary | ICD-10-CM | POA: Diagnosis not present

## 2021-11-14 DIAGNOSIS — R17 Unspecified jaundice: Secondary | ICD-10-CM | POA: Diagnosis not present

## 2021-11-15 DIAGNOSIS — D509 Iron deficiency anemia, unspecified: Secondary | ICD-10-CM | POA: Diagnosis not present

## 2021-11-15 DIAGNOSIS — Z992 Dependence on renal dialysis: Secondary | ICD-10-CM | POA: Diagnosis not present

## 2021-11-15 DIAGNOSIS — E440 Moderate protein-calorie malnutrition: Secondary | ICD-10-CM | POA: Diagnosis not present

## 2021-11-15 DIAGNOSIS — N186 End stage renal disease: Secondary | ICD-10-CM | POA: Diagnosis not present

## 2021-11-15 DIAGNOSIS — N2589 Other disorders resulting from impaired renal tubular function: Secondary | ICD-10-CM | POA: Diagnosis not present

## 2021-11-15 DIAGNOSIS — R17 Unspecified jaundice: Secondary | ICD-10-CM | POA: Diagnosis not present

## 2021-11-15 DIAGNOSIS — D631 Anemia in chronic kidney disease: Secondary | ICD-10-CM | POA: Diagnosis not present

## 2021-11-15 DIAGNOSIS — N2581 Secondary hyperparathyroidism of renal origin: Secondary | ICD-10-CM | POA: Diagnosis not present

## 2021-11-15 DIAGNOSIS — Z79899 Other long term (current) drug therapy: Secondary | ICD-10-CM | POA: Diagnosis not present

## 2021-11-16 DIAGNOSIS — Z79899 Other long term (current) drug therapy: Secondary | ICD-10-CM | POA: Diagnosis not present

## 2021-11-16 DIAGNOSIS — D509 Iron deficiency anemia, unspecified: Secondary | ICD-10-CM | POA: Diagnosis not present

## 2021-11-16 DIAGNOSIS — D631 Anemia in chronic kidney disease: Secondary | ICD-10-CM | POA: Diagnosis not present

## 2021-11-16 DIAGNOSIS — N186 End stage renal disease: Secondary | ICD-10-CM | POA: Diagnosis not present

## 2021-11-16 DIAGNOSIS — E440 Moderate protein-calorie malnutrition: Secondary | ICD-10-CM | POA: Diagnosis not present

## 2021-11-16 DIAGNOSIS — N2589 Other disorders resulting from impaired renal tubular function: Secondary | ICD-10-CM | POA: Diagnosis not present

## 2021-11-16 DIAGNOSIS — Z992 Dependence on renal dialysis: Secondary | ICD-10-CM | POA: Diagnosis not present

## 2021-11-16 DIAGNOSIS — N2581 Secondary hyperparathyroidism of renal origin: Secondary | ICD-10-CM | POA: Diagnosis not present

## 2021-11-16 DIAGNOSIS — R17 Unspecified jaundice: Secondary | ICD-10-CM | POA: Diagnosis not present

## 2021-11-17 DIAGNOSIS — D631 Anemia in chronic kidney disease: Secondary | ICD-10-CM | POA: Diagnosis not present

## 2021-11-17 DIAGNOSIS — N2581 Secondary hyperparathyroidism of renal origin: Secondary | ICD-10-CM | POA: Diagnosis not present

## 2021-11-17 DIAGNOSIS — N186 End stage renal disease: Secondary | ICD-10-CM | POA: Diagnosis not present

## 2021-11-17 DIAGNOSIS — Z992 Dependence on renal dialysis: Secondary | ICD-10-CM | POA: Diagnosis not present

## 2021-11-17 DIAGNOSIS — D509 Iron deficiency anemia, unspecified: Secondary | ICD-10-CM | POA: Diagnosis not present

## 2021-11-17 DIAGNOSIS — N2589 Other disorders resulting from impaired renal tubular function: Secondary | ICD-10-CM | POA: Diagnosis not present

## 2021-11-17 DIAGNOSIS — E440 Moderate protein-calorie malnutrition: Secondary | ICD-10-CM | POA: Diagnosis not present

## 2021-11-17 DIAGNOSIS — Z79899 Other long term (current) drug therapy: Secondary | ICD-10-CM | POA: Diagnosis not present

## 2021-11-17 DIAGNOSIS — R17 Unspecified jaundice: Secondary | ICD-10-CM | POA: Diagnosis not present

## 2021-11-18 ENCOUNTER — Other Ambulatory Visit (INDEPENDENT_AMBULATORY_CARE_PROVIDER_SITE_OTHER): Payer: Self-pay | Admitting: Nurse Practitioner

## 2021-11-18 DIAGNOSIS — N2581 Secondary hyperparathyroidism of renal origin: Secondary | ICD-10-CM | POA: Diagnosis not present

## 2021-11-18 DIAGNOSIS — N186 End stage renal disease: Secondary | ICD-10-CM | POA: Diagnosis not present

## 2021-11-18 DIAGNOSIS — D631 Anemia in chronic kidney disease: Secondary | ICD-10-CM | POA: Diagnosis not present

## 2021-11-18 DIAGNOSIS — N2589 Other disorders resulting from impaired renal tubular function: Secondary | ICD-10-CM | POA: Diagnosis not present

## 2021-11-18 DIAGNOSIS — Z992 Dependence on renal dialysis: Secondary | ICD-10-CM | POA: Diagnosis not present

## 2021-11-18 DIAGNOSIS — I739 Peripheral vascular disease, unspecified: Secondary | ICD-10-CM

## 2021-11-18 DIAGNOSIS — E440 Moderate protein-calorie malnutrition: Secondary | ICD-10-CM | POA: Diagnosis not present

## 2021-11-18 DIAGNOSIS — Z79899 Other long term (current) drug therapy: Secondary | ICD-10-CM | POA: Diagnosis not present

## 2021-11-18 DIAGNOSIS — R17 Unspecified jaundice: Secondary | ICD-10-CM | POA: Diagnosis not present

## 2021-11-18 DIAGNOSIS — D509 Iron deficiency anemia, unspecified: Secondary | ICD-10-CM | POA: Diagnosis not present

## 2021-11-19 ENCOUNTER — Encounter (INDEPENDENT_AMBULATORY_CARE_PROVIDER_SITE_OTHER): Payer: Self-pay | Admitting: Nurse Practitioner

## 2021-11-19 ENCOUNTER — Ambulatory Visit (INDEPENDENT_AMBULATORY_CARE_PROVIDER_SITE_OTHER): Payer: Commercial Managed Care - HMO | Admitting: Nurse Practitioner

## 2021-11-19 ENCOUNTER — Other Ambulatory Visit: Payer: Self-pay

## 2021-11-19 ENCOUNTER — Ambulatory Visit (INDEPENDENT_AMBULATORY_CARE_PROVIDER_SITE_OTHER): Payer: Medicare Other

## 2021-11-19 VITALS — BP 120/68 | HR 86 | Resp 16

## 2021-11-19 DIAGNOSIS — D631 Anemia in chronic kidney disease: Secondary | ICD-10-CM | POA: Diagnosis not present

## 2021-11-19 DIAGNOSIS — N186 End stage renal disease: Secondary | ICD-10-CM | POA: Diagnosis not present

## 2021-11-19 DIAGNOSIS — Z992 Dependence on renal dialysis: Secondary | ICD-10-CM | POA: Diagnosis not present

## 2021-11-19 DIAGNOSIS — I739 Peripheral vascular disease, unspecified: Secondary | ICD-10-CM | POA: Diagnosis not present

## 2021-11-19 DIAGNOSIS — R17 Unspecified jaundice: Secondary | ICD-10-CM | POA: Diagnosis not present

## 2021-11-19 DIAGNOSIS — Z79899 Other long term (current) drug therapy: Secondary | ICD-10-CM | POA: Diagnosis not present

## 2021-11-19 DIAGNOSIS — D509 Iron deficiency anemia, unspecified: Secondary | ICD-10-CM | POA: Diagnosis not present

## 2021-11-19 DIAGNOSIS — E440 Moderate protein-calorie malnutrition: Secondary | ICD-10-CM | POA: Diagnosis not present

## 2021-11-19 DIAGNOSIS — N2581 Secondary hyperparathyroidism of renal origin: Secondary | ICD-10-CM | POA: Diagnosis not present

## 2021-11-19 DIAGNOSIS — N2589 Other disorders resulting from impaired renal tubular function: Secondary | ICD-10-CM | POA: Diagnosis not present

## 2021-11-20 DIAGNOSIS — R17 Unspecified jaundice: Secondary | ICD-10-CM | POA: Diagnosis not present

## 2021-11-20 DIAGNOSIS — D509 Iron deficiency anemia, unspecified: Secondary | ICD-10-CM | POA: Diagnosis not present

## 2021-11-20 DIAGNOSIS — N186 End stage renal disease: Secondary | ICD-10-CM | POA: Diagnosis not present

## 2021-11-20 DIAGNOSIS — Z79899 Other long term (current) drug therapy: Secondary | ICD-10-CM | POA: Diagnosis not present

## 2021-11-20 DIAGNOSIS — E440 Moderate protein-calorie malnutrition: Secondary | ICD-10-CM | POA: Diagnosis not present

## 2021-11-20 DIAGNOSIS — N2581 Secondary hyperparathyroidism of renal origin: Secondary | ICD-10-CM | POA: Diagnosis not present

## 2021-11-20 DIAGNOSIS — Z992 Dependence on renal dialysis: Secondary | ICD-10-CM | POA: Diagnosis not present

## 2021-11-20 DIAGNOSIS — D631 Anemia in chronic kidney disease: Secondary | ICD-10-CM | POA: Diagnosis not present

## 2021-11-20 DIAGNOSIS — N2589 Other disorders resulting from impaired renal tubular function: Secondary | ICD-10-CM | POA: Diagnosis not present

## 2021-11-21 DIAGNOSIS — N2581 Secondary hyperparathyroidism of renal origin: Secondary | ICD-10-CM | POA: Diagnosis not present

## 2021-11-21 DIAGNOSIS — D631 Anemia in chronic kidney disease: Secondary | ICD-10-CM | POA: Diagnosis not present

## 2021-11-21 DIAGNOSIS — N186 End stage renal disease: Secondary | ICD-10-CM | POA: Diagnosis not present

## 2021-11-21 DIAGNOSIS — Z992 Dependence on renal dialysis: Secondary | ICD-10-CM | POA: Diagnosis not present

## 2021-11-21 DIAGNOSIS — N2589 Other disorders resulting from impaired renal tubular function: Secondary | ICD-10-CM | POA: Diagnosis not present

## 2021-11-21 DIAGNOSIS — E440 Moderate protein-calorie malnutrition: Secondary | ICD-10-CM | POA: Diagnosis not present

## 2021-11-21 DIAGNOSIS — Z79899 Other long term (current) drug therapy: Secondary | ICD-10-CM | POA: Diagnosis not present

## 2021-11-21 DIAGNOSIS — D509 Iron deficiency anemia, unspecified: Secondary | ICD-10-CM | POA: Diagnosis not present

## 2021-11-21 DIAGNOSIS — R17 Unspecified jaundice: Secondary | ICD-10-CM | POA: Diagnosis not present

## 2021-11-22 DIAGNOSIS — N186 End stage renal disease: Secondary | ICD-10-CM | POA: Diagnosis not present

## 2021-11-22 DIAGNOSIS — R17 Unspecified jaundice: Secondary | ICD-10-CM | POA: Diagnosis not present

## 2021-11-22 DIAGNOSIS — D509 Iron deficiency anemia, unspecified: Secondary | ICD-10-CM | POA: Diagnosis not present

## 2021-11-22 DIAGNOSIS — D631 Anemia in chronic kidney disease: Secondary | ICD-10-CM | POA: Diagnosis not present

## 2021-11-22 DIAGNOSIS — E440 Moderate protein-calorie malnutrition: Secondary | ICD-10-CM | POA: Diagnosis not present

## 2021-11-22 DIAGNOSIS — Z79899 Other long term (current) drug therapy: Secondary | ICD-10-CM | POA: Diagnosis not present

## 2021-11-22 DIAGNOSIS — Z992 Dependence on renal dialysis: Secondary | ICD-10-CM | POA: Diagnosis not present

## 2021-11-22 DIAGNOSIS — N2581 Secondary hyperparathyroidism of renal origin: Secondary | ICD-10-CM | POA: Diagnosis not present

## 2021-11-22 DIAGNOSIS — N2589 Other disorders resulting from impaired renal tubular function: Secondary | ICD-10-CM | POA: Diagnosis not present

## 2021-11-23 DIAGNOSIS — N186 End stage renal disease: Secondary | ICD-10-CM | POA: Diagnosis not present

## 2021-11-23 DIAGNOSIS — D509 Iron deficiency anemia, unspecified: Secondary | ICD-10-CM | POA: Diagnosis not present

## 2021-11-23 DIAGNOSIS — R17 Unspecified jaundice: Secondary | ICD-10-CM | POA: Diagnosis not present

## 2021-11-23 DIAGNOSIS — Z992 Dependence on renal dialysis: Secondary | ICD-10-CM | POA: Diagnosis not present

## 2021-11-23 DIAGNOSIS — D631 Anemia in chronic kidney disease: Secondary | ICD-10-CM | POA: Diagnosis not present

## 2021-11-23 DIAGNOSIS — Z79899 Other long term (current) drug therapy: Secondary | ICD-10-CM | POA: Diagnosis not present

## 2021-11-23 DIAGNOSIS — N2589 Other disorders resulting from impaired renal tubular function: Secondary | ICD-10-CM | POA: Diagnosis not present

## 2021-11-23 DIAGNOSIS — N2581 Secondary hyperparathyroidism of renal origin: Secondary | ICD-10-CM | POA: Diagnosis not present

## 2021-11-23 DIAGNOSIS — E440 Moderate protein-calorie malnutrition: Secondary | ICD-10-CM | POA: Diagnosis not present

## 2021-11-24 DIAGNOSIS — E440 Moderate protein-calorie malnutrition: Secondary | ICD-10-CM | POA: Diagnosis not present

## 2021-11-24 DIAGNOSIS — N2589 Other disorders resulting from impaired renal tubular function: Secondary | ICD-10-CM | POA: Diagnosis not present

## 2021-11-24 DIAGNOSIS — D631 Anemia in chronic kidney disease: Secondary | ICD-10-CM | POA: Diagnosis not present

## 2021-11-24 DIAGNOSIS — N186 End stage renal disease: Secondary | ICD-10-CM | POA: Diagnosis not present

## 2021-11-24 DIAGNOSIS — D509 Iron deficiency anemia, unspecified: Secondary | ICD-10-CM | POA: Diagnosis not present

## 2021-11-24 DIAGNOSIS — Z992 Dependence on renal dialysis: Secondary | ICD-10-CM | POA: Diagnosis not present

## 2021-11-24 DIAGNOSIS — N2581 Secondary hyperparathyroidism of renal origin: Secondary | ICD-10-CM | POA: Diagnosis not present

## 2021-11-24 DIAGNOSIS — Z79899 Other long term (current) drug therapy: Secondary | ICD-10-CM | POA: Diagnosis not present

## 2021-11-24 DIAGNOSIS — R17 Unspecified jaundice: Secondary | ICD-10-CM | POA: Diagnosis not present

## 2021-11-25 DIAGNOSIS — Z992 Dependence on renal dialysis: Secondary | ICD-10-CM | POA: Diagnosis not present

## 2021-11-25 DIAGNOSIS — R17 Unspecified jaundice: Secondary | ICD-10-CM | POA: Diagnosis not present

## 2021-11-25 DIAGNOSIS — E440 Moderate protein-calorie malnutrition: Secondary | ICD-10-CM | POA: Diagnosis not present

## 2021-11-25 DIAGNOSIS — N2589 Other disorders resulting from impaired renal tubular function: Secondary | ICD-10-CM | POA: Diagnosis not present

## 2021-11-25 DIAGNOSIS — D631 Anemia in chronic kidney disease: Secondary | ICD-10-CM | POA: Diagnosis not present

## 2021-11-25 DIAGNOSIS — N2581 Secondary hyperparathyroidism of renal origin: Secondary | ICD-10-CM | POA: Diagnosis not present

## 2021-11-25 DIAGNOSIS — D509 Iron deficiency anemia, unspecified: Secondary | ICD-10-CM | POA: Diagnosis not present

## 2021-11-25 DIAGNOSIS — N186 End stage renal disease: Secondary | ICD-10-CM | POA: Diagnosis not present

## 2021-11-25 DIAGNOSIS — G4733 Obstructive sleep apnea (adult) (pediatric): Secondary | ICD-10-CM | POA: Diagnosis not present

## 2021-11-25 DIAGNOSIS — Z79899 Other long term (current) drug therapy: Secondary | ICD-10-CM | POA: Diagnosis not present

## 2021-11-26 DIAGNOSIS — N186 End stage renal disease: Secondary | ICD-10-CM | POA: Diagnosis not present

## 2021-11-26 DIAGNOSIS — Z79899 Other long term (current) drug therapy: Secondary | ICD-10-CM | POA: Diagnosis not present

## 2021-11-26 DIAGNOSIS — Z992 Dependence on renal dialysis: Secondary | ICD-10-CM | POA: Diagnosis not present

## 2021-11-26 DIAGNOSIS — R17 Unspecified jaundice: Secondary | ICD-10-CM | POA: Diagnosis not present

## 2021-11-26 DIAGNOSIS — D509 Iron deficiency anemia, unspecified: Secondary | ICD-10-CM | POA: Diagnosis not present

## 2021-11-26 DIAGNOSIS — E440 Moderate protein-calorie malnutrition: Secondary | ICD-10-CM | POA: Diagnosis not present

## 2021-11-26 DIAGNOSIS — N2589 Other disorders resulting from impaired renal tubular function: Secondary | ICD-10-CM | POA: Diagnosis not present

## 2021-11-26 DIAGNOSIS — D631 Anemia in chronic kidney disease: Secondary | ICD-10-CM | POA: Diagnosis not present

## 2021-11-26 DIAGNOSIS — N2581 Secondary hyperparathyroidism of renal origin: Secondary | ICD-10-CM | POA: Diagnosis not present

## 2021-11-27 DIAGNOSIS — E440 Moderate protein-calorie malnutrition: Secondary | ICD-10-CM | POA: Diagnosis not present

## 2021-11-27 DIAGNOSIS — N2581 Secondary hyperparathyroidism of renal origin: Secondary | ICD-10-CM | POA: Diagnosis not present

## 2021-11-27 DIAGNOSIS — Z992 Dependence on renal dialysis: Secondary | ICD-10-CM | POA: Diagnosis not present

## 2021-11-27 DIAGNOSIS — D509 Iron deficiency anemia, unspecified: Secondary | ICD-10-CM | POA: Diagnosis not present

## 2021-11-27 DIAGNOSIS — R17 Unspecified jaundice: Secondary | ICD-10-CM | POA: Diagnosis not present

## 2021-11-27 DIAGNOSIS — N186 End stage renal disease: Secondary | ICD-10-CM | POA: Diagnosis not present

## 2021-11-27 DIAGNOSIS — N2589 Other disorders resulting from impaired renal tubular function: Secondary | ICD-10-CM | POA: Diagnosis not present

## 2021-11-27 DIAGNOSIS — Z79899 Other long term (current) drug therapy: Secondary | ICD-10-CM | POA: Diagnosis not present

## 2021-11-27 DIAGNOSIS — D631 Anemia in chronic kidney disease: Secondary | ICD-10-CM | POA: Diagnosis not present

## 2021-11-28 DIAGNOSIS — R17 Unspecified jaundice: Secondary | ICD-10-CM | POA: Diagnosis not present

## 2021-11-28 DIAGNOSIS — Z79899 Other long term (current) drug therapy: Secondary | ICD-10-CM | POA: Diagnosis not present

## 2021-11-28 DIAGNOSIS — Z992 Dependence on renal dialysis: Secondary | ICD-10-CM | POA: Diagnosis not present

## 2021-11-28 DIAGNOSIS — N186 End stage renal disease: Secondary | ICD-10-CM | POA: Diagnosis not present

## 2021-11-28 DIAGNOSIS — N2581 Secondary hyperparathyroidism of renal origin: Secondary | ICD-10-CM | POA: Diagnosis not present

## 2021-11-28 DIAGNOSIS — E440 Moderate protein-calorie malnutrition: Secondary | ICD-10-CM | POA: Diagnosis not present

## 2021-11-28 DIAGNOSIS — N2589 Other disorders resulting from impaired renal tubular function: Secondary | ICD-10-CM | POA: Diagnosis not present

## 2021-11-28 DIAGNOSIS — D631 Anemia in chronic kidney disease: Secondary | ICD-10-CM | POA: Diagnosis not present

## 2021-11-28 DIAGNOSIS — D509 Iron deficiency anemia, unspecified: Secondary | ICD-10-CM | POA: Diagnosis not present

## 2021-11-29 ENCOUNTER — Encounter (INDEPENDENT_AMBULATORY_CARE_PROVIDER_SITE_OTHER): Payer: Self-pay | Admitting: Nurse Practitioner

## 2021-11-29 DIAGNOSIS — N2589 Other disorders resulting from impaired renal tubular function: Secondary | ICD-10-CM | POA: Diagnosis not present

## 2021-11-29 DIAGNOSIS — D509 Iron deficiency anemia, unspecified: Secondary | ICD-10-CM | POA: Diagnosis not present

## 2021-11-29 DIAGNOSIS — R17 Unspecified jaundice: Secondary | ICD-10-CM | POA: Diagnosis not present

## 2021-11-29 DIAGNOSIS — N186 End stage renal disease: Secondary | ICD-10-CM | POA: Diagnosis not present

## 2021-11-29 DIAGNOSIS — N2581 Secondary hyperparathyroidism of renal origin: Secondary | ICD-10-CM | POA: Diagnosis not present

## 2021-11-29 DIAGNOSIS — Z79899 Other long term (current) drug therapy: Secondary | ICD-10-CM | POA: Diagnosis not present

## 2021-11-29 DIAGNOSIS — E440 Moderate protein-calorie malnutrition: Secondary | ICD-10-CM | POA: Diagnosis not present

## 2021-11-29 DIAGNOSIS — Z992 Dependence on renal dialysis: Secondary | ICD-10-CM | POA: Diagnosis not present

## 2021-11-29 DIAGNOSIS — D631 Anemia in chronic kidney disease: Secondary | ICD-10-CM | POA: Diagnosis not present

## 2021-11-30 DIAGNOSIS — Z79899 Other long term (current) drug therapy: Secondary | ICD-10-CM | POA: Diagnosis not present

## 2021-11-30 DIAGNOSIS — E440 Moderate protein-calorie malnutrition: Secondary | ICD-10-CM | POA: Diagnosis not present

## 2021-11-30 DIAGNOSIS — D509 Iron deficiency anemia, unspecified: Secondary | ICD-10-CM | POA: Diagnosis not present

## 2021-11-30 DIAGNOSIS — R17 Unspecified jaundice: Secondary | ICD-10-CM | POA: Diagnosis not present

## 2021-11-30 DIAGNOSIS — Z992 Dependence on renal dialysis: Secondary | ICD-10-CM | POA: Diagnosis not present

## 2021-11-30 DIAGNOSIS — N186 End stage renal disease: Secondary | ICD-10-CM | POA: Diagnosis not present

## 2021-11-30 DIAGNOSIS — N2589 Other disorders resulting from impaired renal tubular function: Secondary | ICD-10-CM | POA: Diagnosis not present

## 2021-11-30 DIAGNOSIS — N2581 Secondary hyperparathyroidism of renal origin: Secondary | ICD-10-CM | POA: Diagnosis not present

## 2021-11-30 DIAGNOSIS — D631 Anemia in chronic kidney disease: Secondary | ICD-10-CM | POA: Diagnosis not present

## 2021-11-30 NOTE — Progress Notes (Signed)
Patient was contacted following office visit due to scheduling mixup, to go over ultrasound results.  Today the patient has an ABI of 1.43 on the right and 1.35 on the left.  He has triphasic tibial artery waveforms with good toe waveforms bilaterally.  Patient's toe amputation sites are improving and healing well.  Currently no role for intervention.  We will have patient return in 6 months with noninvasive studies or sooner if there are issues with wound healing.

## 2021-12-01 DIAGNOSIS — Z794 Long term (current) use of insulin: Secondary | ICD-10-CM | POA: Diagnosis not present

## 2021-12-01 DIAGNOSIS — N186 End stage renal disease: Secondary | ICD-10-CM | POA: Diagnosis not present

## 2021-12-01 DIAGNOSIS — Z89422 Acquired absence of other left toe(s): Secondary | ICD-10-CM | POA: Diagnosis not present

## 2021-12-01 DIAGNOSIS — R17 Unspecified jaundice: Secondary | ICD-10-CM | POA: Diagnosis not present

## 2021-12-01 DIAGNOSIS — D631 Anemia in chronic kidney disease: Secondary | ICD-10-CM | POA: Diagnosis not present

## 2021-12-01 DIAGNOSIS — N2589 Other disorders resulting from impaired renal tubular function: Secondary | ICD-10-CM | POA: Diagnosis not present

## 2021-12-01 DIAGNOSIS — E440 Moderate protein-calorie malnutrition: Secondary | ICD-10-CM | POA: Diagnosis not present

## 2021-12-01 DIAGNOSIS — E11621 Type 2 diabetes mellitus with foot ulcer: Secondary | ICD-10-CM | POA: Diagnosis not present

## 2021-12-01 DIAGNOSIS — N2581 Secondary hyperparathyroidism of renal origin: Secondary | ICD-10-CM | POA: Diagnosis not present

## 2021-12-01 DIAGNOSIS — Z79899 Other long term (current) drug therapy: Secondary | ICD-10-CM | POA: Diagnosis not present

## 2021-12-01 DIAGNOSIS — L97522 Non-pressure chronic ulcer of other part of left foot with fat layer exposed: Secondary | ICD-10-CM | POA: Diagnosis not present

## 2021-12-01 DIAGNOSIS — Z992 Dependence on renal dialysis: Secondary | ICD-10-CM | POA: Diagnosis not present

## 2021-12-01 DIAGNOSIS — D509 Iron deficiency anemia, unspecified: Secondary | ICD-10-CM | POA: Diagnosis not present

## 2021-12-02 DIAGNOSIS — N2581 Secondary hyperparathyroidism of renal origin: Secondary | ICD-10-CM | POA: Diagnosis not present

## 2021-12-02 DIAGNOSIS — D631 Anemia in chronic kidney disease: Secondary | ICD-10-CM | POA: Diagnosis not present

## 2021-12-02 DIAGNOSIS — N2589 Other disorders resulting from impaired renal tubular function: Secondary | ICD-10-CM | POA: Diagnosis not present

## 2021-12-02 DIAGNOSIS — R17 Unspecified jaundice: Secondary | ICD-10-CM | POA: Diagnosis not present

## 2021-12-02 DIAGNOSIS — Z992 Dependence on renal dialysis: Secondary | ICD-10-CM | POA: Diagnosis not present

## 2021-12-02 DIAGNOSIS — E440 Moderate protein-calorie malnutrition: Secondary | ICD-10-CM | POA: Diagnosis not present

## 2021-12-02 DIAGNOSIS — N186 End stage renal disease: Secondary | ICD-10-CM | POA: Diagnosis not present

## 2021-12-02 DIAGNOSIS — D509 Iron deficiency anemia, unspecified: Secondary | ICD-10-CM | POA: Diagnosis not present

## 2021-12-02 DIAGNOSIS — Z79899 Other long term (current) drug therapy: Secondary | ICD-10-CM | POA: Diagnosis not present

## 2021-12-03 DIAGNOSIS — E440 Moderate protein-calorie malnutrition: Secondary | ICD-10-CM | POA: Diagnosis not present

## 2021-12-03 DIAGNOSIS — D509 Iron deficiency anemia, unspecified: Secondary | ICD-10-CM | POA: Diagnosis not present

## 2021-12-03 DIAGNOSIS — D631 Anemia in chronic kidney disease: Secondary | ICD-10-CM | POA: Diagnosis not present

## 2021-12-03 DIAGNOSIS — Z992 Dependence on renal dialysis: Secondary | ICD-10-CM | POA: Diagnosis not present

## 2021-12-03 DIAGNOSIS — Z79899 Other long term (current) drug therapy: Secondary | ICD-10-CM | POA: Diagnosis not present

## 2021-12-03 DIAGNOSIS — N2581 Secondary hyperparathyroidism of renal origin: Secondary | ICD-10-CM | POA: Diagnosis not present

## 2021-12-03 DIAGNOSIS — N2589 Other disorders resulting from impaired renal tubular function: Secondary | ICD-10-CM | POA: Diagnosis not present

## 2021-12-03 DIAGNOSIS — N186 End stage renal disease: Secondary | ICD-10-CM | POA: Diagnosis not present

## 2021-12-03 DIAGNOSIS — R17 Unspecified jaundice: Secondary | ICD-10-CM | POA: Diagnosis not present

## 2021-12-04 DIAGNOSIS — N2589 Other disorders resulting from impaired renal tubular function: Secondary | ICD-10-CM | POA: Diagnosis not present

## 2021-12-04 DIAGNOSIS — D509 Iron deficiency anemia, unspecified: Secondary | ICD-10-CM | POA: Diagnosis not present

## 2021-12-04 DIAGNOSIS — R17 Unspecified jaundice: Secondary | ICD-10-CM | POA: Diagnosis not present

## 2021-12-04 DIAGNOSIS — Z992 Dependence on renal dialysis: Secondary | ICD-10-CM | POA: Diagnosis not present

## 2021-12-04 DIAGNOSIS — E440 Moderate protein-calorie malnutrition: Secondary | ICD-10-CM | POA: Diagnosis not present

## 2021-12-04 DIAGNOSIS — D631 Anemia in chronic kidney disease: Secondary | ICD-10-CM | POA: Diagnosis not present

## 2021-12-04 DIAGNOSIS — N186 End stage renal disease: Secondary | ICD-10-CM | POA: Diagnosis not present

## 2021-12-04 DIAGNOSIS — N2581 Secondary hyperparathyroidism of renal origin: Secondary | ICD-10-CM | POA: Diagnosis not present

## 2021-12-04 DIAGNOSIS — Z79899 Other long term (current) drug therapy: Secondary | ICD-10-CM | POA: Diagnosis not present

## 2021-12-05 DIAGNOSIS — R17 Unspecified jaundice: Secondary | ICD-10-CM | POA: Diagnosis not present

## 2021-12-05 DIAGNOSIS — E440 Moderate protein-calorie malnutrition: Secondary | ICD-10-CM | POA: Diagnosis not present

## 2021-12-05 DIAGNOSIS — D631 Anemia in chronic kidney disease: Secondary | ICD-10-CM | POA: Diagnosis not present

## 2021-12-05 DIAGNOSIS — Z992 Dependence on renal dialysis: Secondary | ICD-10-CM | POA: Diagnosis not present

## 2021-12-05 DIAGNOSIS — N2581 Secondary hyperparathyroidism of renal origin: Secondary | ICD-10-CM | POA: Diagnosis not present

## 2021-12-05 DIAGNOSIS — N2589 Other disorders resulting from impaired renal tubular function: Secondary | ICD-10-CM | POA: Diagnosis not present

## 2021-12-05 DIAGNOSIS — Z79899 Other long term (current) drug therapy: Secondary | ICD-10-CM | POA: Diagnosis not present

## 2021-12-05 DIAGNOSIS — D509 Iron deficiency anemia, unspecified: Secondary | ICD-10-CM | POA: Diagnosis not present

## 2021-12-05 DIAGNOSIS — N186 End stage renal disease: Secondary | ICD-10-CM | POA: Diagnosis not present

## 2021-12-06 DIAGNOSIS — E440 Moderate protein-calorie malnutrition: Secondary | ICD-10-CM | POA: Diagnosis not present

## 2021-12-06 DIAGNOSIS — R17 Unspecified jaundice: Secondary | ICD-10-CM | POA: Diagnosis not present

## 2021-12-06 DIAGNOSIS — N186 End stage renal disease: Secondary | ICD-10-CM | POA: Diagnosis not present

## 2021-12-06 DIAGNOSIS — N2581 Secondary hyperparathyroidism of renal origin: Secondary | ICD-10-CM | POA: Diagnosis not present

## 2021-12-06 DIAGNOSIS — N2589 Other disorders resulting from impaired renal tubular function: Secondary | ICD-10-CM | POA: Diagnosis not present

## 2021-12-06 DIAGNOSIS — Z992 Dependence on renal dialysis: Secondary | ICD-10-CM | POA: Diagnosis not present

## 2021-12-06 DIAGNOSIS — D509 Iron deficiency anemia, unspecified: Secondary | ICD-10-CM | POA: Diagnosis not present

## 2021-12-06 DIAGNOSIS — Z79899 Other long term (current) drug therapy: Secondary | ICD-10-CM | POA: Diagnosis not present

## 2021-12-06 DIAGNOSIS — D631 Anemia in chronic kidney disease: Secondary | ICD-10-CM | POA: Diagnosis not present

## 2021-12-07 DIAGNOSIS — D631 Anemia in chronic kidney disease: Secondary | ICD-10-CM | POA: Diagnosis not present

## 2021-12-07 DIAGNOSIS — R17 Unspecified jaundice: Secondary | ICD-10-CM | POA: Diagnosis not present

## 2021-12-07 DIAGNOSIS — Z79899 Other long term (current) drug therapy: Secondary | ICD-10-CM | POA: Diagnosis not present

## 2021-12-07 DIAGNOSIS — E440 Moderate protein-calorie malnutrition: Secondary | ICD-10-CM | POA: Diagnosis not present

## 2021-12-07 DIAGNOSIS — N186 End stage renal disease: Secondary | ICD-10-CM | POA: Diagnosis not present

## 2021-12-07 DIAGNOSIS — D509 Iron deficiency anemia, unspecified: Secondary | ICD-10-CM | POA: Diagnosis not present

## 2021-12-07 DIAGNOSIS — Z992 Dependence on renal dialysis: Secondary | ICD-10-CM | POA: Diagnosis not present

## 2021-12-07 DIAGNOSIS — N2589 Other disorders resulting from impaired renal tubular function: Secondary | ICD-10-CM | POA: Diagnosis not present

## 2021-12-07 DIAGNOSIS — N2581 Secondary hyperparathyroidism of renal origin: Secondary | ICD-10-CM | POA: Diagnosis not present

## 2021-12-08 DIAGNOSIS — E1122 Type 2 diabetes mellitus with diabetic chronic kidney disease: Secondary | ICD-10-CM | POA: Diagnosis not present

## 2021-12-08 DIAGNOSIS — N2581 Secondary hyperparathyroidism of renal origin: Secondary | ICD-10-CM | POA: Diagnosis not present

## 2021-12-08 DIAGNOSIS — Z992 Dependence on renal dialysis: Secondary | ICD-10-CM | POA: Diagnosis not present

## 2021-12-08 DIAGNOSIS — D631 Anemia in chronic kidney disease: Secondary | ICD-10-CM | POA: Diagnosis not present

## 2021-12-08 DIAGNOSIS — E440 Moderate protein-calorie malnutrition: Secondary | ICD-10-CM | POA: Diagnosis not present

## 2021-12-08 DIAGNOSIS — Z79899 Other long term (current) drug therapy: Secondary | ICD-10-CM | POA: Diagnosis not present

## 2021-12-08 DIAGNOSIS — N2589 Other disorders resulting from impaired renal tubular function: Secondary | ICD-10-CM | POA: Diagnosis not present

## 2021-12-08 DIAGNOSIS — R17 Unspecified jaundice: Secondary | ICD-10-CM | POA: Diagnosis not present

## 2021-12-08 DIAGNOSIS — D509 Iron deficiency anemia, unspecified: Secondary | ICD-10-CM | POA: Diagnosis not present

## 2021-12-08 DIAGNOSIS — N186 End stage renal disease: Secondary | ICD-10-CM | POA: Diagnosis not present

## 2021-12-09 DIAGNOSIS — Z992 Dependence on renal dialysis: Secondary | ICD-10-CM | POA: Diagnosis not present

## 2021-12-09 DIAGNOSIS — N2581 Secondary hyperparathyroidism of renal origin: Secondary | ICD-10-CM | POA: Diagnosis not present

## 2021-12-09 DIAGNOSIS — N186 End stage renal disease: Secondary | ICD-10-CM | POA: Diagnosis not present

## 2021-12-09 DIAGNOSIS — D631 Anemia in chronic kidney disease: Secondary | ICD-10-CM | POA: Diagnosis not present

## 2021-12-10 DIAGNOSIS — D631 Anemia in chronic kidney disease: Secondary | ICD-10-CM | POA: Diagnosis not present

## 2021-12-10 DIAGNOSIS — Z992 Dependence on renal dialysis: Secondary | ICD-10-CM | POA: Diagnosis not present

## 2021-12-10 DIAGNOSIS — N186 End stage renal disease: Secondary | ICD-10-CM | POA: Diagnosis not present

## 2021-12-10 DIAGNOSIS — N2581 Secondary hyperparathyroidism of renal origin: Secondary | ICD-10-CM | POA: Diagnosis not present

## 2021-12-11 DIAGNOSIS — N2581 Secondary hyperparathyroidism of renal origin: Secondary | ICD-10-CM | POA: Diagnosis not present

## 2021-12-11 DIAGNOSIS — Z992 Dependence on renal dialysis: Secondary | ICD-10-CM | POA: Diagnosis not present

## 2021-12-11 DIAGNOSIS — N186 End stage renal disease: Secondary | ICD-10-CM | POA: Diagnosis not present

## 2021-12-11 DIAGNOSIS — D631 Anemia in chronic kidney disease: Secondary | ICD-10-CM | POA: Diagnosis not present

## 2021-12-12 DIAGNOSIS — N2581 Secondary hyperparathyroidism of renal origin: Secondary | ICD-10-CM | POA: Diagnosis not present

## 2021-12-12 DIAGNOSIS — D631 Anemia in chronic kidney disease: Secondary | ICD-10-CM | POA: Diagnosis not present

## 2021-12-12 DIAGNOSIS — Z992 Dependence on renal dialysis: Secondary | ICD-10-CM | POA: Diagnosis not present

## 2021-12-12 DIAGNOSIS — N186 End stage renal disease: Secondary | ICD-10-CM | POA: Diagnosis not present

## 2021-12-13 DIAGNOSIS — N2581 Secondary hyperparathyroidism of renal origin: Secondary | ICD-10-CM | POA: Diagnosis not present

## 2021-12-13 DIAGNOSIS — N186 End stage renal disease: Secondary | ICD-10-CM | POA: Diagnosis not present

## 2021-12-13 DIAGNOSIS — D631 Anemia in chronic kidney disease: Secondary | ICD-10-CM | POA: Diagnosis not present

## 2021-12-13 DIAGNOSIS — Z992 Dependence on renal dialysis: Secondary | ICD-10-CM | POA: Diagnosis not present

## 2021-12-14 DIAGNOSIS — N186 End stage renal disease: Secondary | ICD-10-CM | POA: Diagnosis not present

## 2021-12-14 DIAGNOSIS — Z992 Dependence on renal dialysis: Secondary | ICD-10-CM | POA: Diagnosis not present

## 2021-12-14 DIAGNOSIS — D631 Anemia in chronic kidney disease: Secondary | ICD-10-CM | POA: Diagnosis not present

## 2021-12-14 DIAGNOSIS — N2581 Secondary hyperparathyroidism of renal origin: Secondary | ICD-10-CM | POA: Diagnosis not present

## 2021-12-15 DIAGNOSIS — N186 End stage renal disease: Secondary | ICD-10-CM | POA: Diagnosis not present

## 2021-12-15 DIAGNOSIS — Z992 Dependence on renal dialysis: Secondary | ICD-10-CM | POA: Diagnosis not present

## 2021-12-15 DIAGNOSIS — N2581 Secondary hyperparathyroidism of renal origin: Secondary | ICD-10-CM | POA: Diagnosis not present

## 2021-12-15 DIAGNOSIS — D631 Anemia in chronic kidney disease: Secondary | ICD-10-CM | POA: Diagnosis not present

## 2021-12-16 DIAGNOSIS — Z992 Dependence on renal dialysis: Secondary | ICD-10-CM | POA: Diagnosis not present

## 2021-12-16 DIAGNOSIS — N2581 Secondary hyperparathyroidism of renal origin: Secondary | ICD-10-CM | POA: Diagnosis not present

## 2021-12-16 DIAGNOSIS — D631 Anemia in chronic kidney disease: Secondary | ICD-10-CM | POA: Diagnosis not present

## 2021-12-16 DIAGNOSIS — N186 End stage renal disease: Secondary | ICD-10-CM | POA: Diagnosis not present

## 2021-12-17 DIAGNOSIS — N186 End stage renal disease: Secondary | ICD-10-CM | POA: Diagnosis not present

## 2021-12-17 DIAGNOSIS — N2581 Secondary hyperparathyroidism of renal origin: Secondary | ICD-10-CM | POA: Diagnosis not present

## 2021-12-17 DIAGNOSIS — Z992 Dependence on renal dialysis: Secondary | ICD-10-CM | POA: Diagnosis not present

## 2021-12-17 DIAGNOSIS — D631 Anemia in chronic kidney disease: Secondary | ICD-10-CM | POA: Diagnosis not present

## 2021-12-18 DIAGNOSIS — N2581 Secondary hyperparathyroidism of renal origin: Secondary | ICD-10-CM | POA: Diagnosis not present

## 2021-12-18 DIAGNOSIS — Z992 Dependence on renal dialysis: Secondary | ICD-10-CM | POA: Diagnosis not present

## 2021-12-18 DIAGNOSIS — N186 End stage renal disease: Secondary | ICD-10-CM | POA: Diagnosis not present

## 2021-12-18 DIAGNOSIS — D631 Anemia in chronic kidney disease: Secondary | ICD-10-CM | POA: Diagnosis not present

## 2021-12-19 DIAGNOSIS — Z992 Dependence on renal dialysis: Secondary | ICD-10-CM | POA: Diagnosis not present

## 2021-12-19 DIAGNOSIS — N2581 Secondary hyperparathyroidism of renal origin: Secondary | ICD-10-CM | POA: Diagnosis not present

## 2021-12-19 DIAGNOSIS — D631 Anemia in chronic kidney disease: Secondary | ICD-10-CM | POA: Diagnosis not present

## 2021-12-19 DIAGNOSIS — N186 End stage renal disease: Secondary | ICD-10-CM | POA: Diagnosis not present

## 2021-12-20 DIAGNOSIS — Z992 Dependence on renal dialysis: Secondary | ICD-10-CM | POA: Diagnosis not present

## 2021-12-20 DIAGNOSIS — D631 Anemia in chronic kidney disease: Secondary | ICD-10-CM | POA: Diagnosis not present

## 2021-12-20 DIAGNOSIS — N2581 Secondary hyperparathyroidism of renal origin: Secondary | ICD-10-CM | POA: Diagnosis not present

## 2021-12-20 DIAGNOSIS — N186 End stage renal disease: Secondary | ICD-10-CM | POA: Diagnosis not present

## 2021-12-21 DIAGNOSIS — D631 Anemia in chronic kidney disease: Secondary | ICD-10-CM | POA: Diagnosis not present

## 2021-12-21 DIAGNOSIS — N186 End stage renal disease: Secondary | ICD-10-CM | POA: Diagnosis not present

## 2021-12-21 DIAGNOSIS — N2581 Secondary hyperparathyroidism of renal origin: Secondary | ICD-10-CM | POA: Diagnosis not present

## 2021-12-21 DIAGNOSIS — Z992 Dependence on renal dialysis: Secondary | ICD-10-CM | POA: Diagnosis not present

## 2021-12-22 DIAGNOSIS — Z992 Dependence on renal dialysis: Secondary | ICD-10-CM | POA: Diagnosis not present

## 2021-12-22 DIAGNOSIS — N2581 Secondary hyperparathyroidism of renal origin: Secondary | ICD-10-CM | POA: Diagnosis not present

## 2021-12-22 DIAGNOSIS — N186 End stage renal disease: Secondary | ICD-10-CM | POA: Diagnosis not present

## 2021-12-22 DIAGNOSIS — D631 Anemia in chronic kidney disease: Secondary | ICD-10-CM | POA: Diagnosis not present

## 2021-12-23 DIAGNOSIS — Z992 Dependence on renal dialysis: Secondary | ICD-10-CM | POA: Diagnosis not present

## 2021-12-23 DIAGNOSIS — D631 Anemia in chronic kidney disease: Secondary | ICD-10-CM | POA: Diagnosis not present

## 2021-12-23 DIAGNOSIS — N186 End stage renal disease: Secondary | ICD-10-CM | POA: Diagnosis not present

## 2021-12-23 DIAGNOSIS — N2581 Secondary hyperparathyroidism of renal origin: Secondary | ICD-10-CM | POA: Diagnosis not present

## 2021-12-24 DIAGNOSIS — Z992 Dependence on renal dialysis: Secondary | ICD-10-CM | POA: Diagnosis not present

## 2021-12-24 DIAGNOSIS — J351 Hypertrophy of tonsils: Secondary | ICD-10-CM | POA: Diagnosis not present

## 2021-12-24 DIAGNOSIS — N186 End stage renal disease: Secondary | ICD-10-CM | POA: Diagnosis not present

## 2021-12-24 DIAGNOSIS — G4733 Obstructive sleep apnea (adult) (pediatric): Secondary | ICD-10-CM | POA: Diagnosis not present

## 2021-12-24 DIAGNOSIS — D631 Anemia in chronic kidney disease: Secondary | ICD-10-CM | POA: Diagnosis not present

## 2021-12-24 DIAGNOSIS — N2581 Secondary hyperparathyroidism of renal origin: Secondary | ICD-10-CM | POA: Diagnosis not present

## 2021-12-25 DIAGNOSIS — N186 End stage renal disease: Secondary | ICD-10-CM | POA: Diagnosis not present

## 2021-12-25 DIAGNOSIS — N2581 Secondary hyperparathyroidism of renal origin: Secondary | ICD-10-CM | POA: Diagnosis not present

## 2021-12-25 DIAGNOSIS — Z992 Dependence on renal dialysis: Secondary | ICD-10-CM | POA: Diagnosis not present

## 2021-12-25 DIAGNOSIS — D631 Anemia in chronic kidney disease: Secondary | ICD-10-CM | POA: Diagnosis not present

## 2021-12-26 DIAGNOSIS — N186 End stage renal disease: Secondary | ICD-10-CM | POA: Diagnosis not present

## 2021-12-26 DIAGNOSIS — Z992 Dependence on renal dialysis: Secondary | ICD-10-CM | POA: Diagnosis not present

## 2021-12-26 DIAGNOSIS — D631 Anemia in chronic kidney disease: Secondary | ICD-10-CM | POA: Diagnosis not present

## 2021-12-26 DIAGNOSIS — N2581 Secondary hyperparathyroidism of renal origin: Secondary | ICD-10-CM | POA: Diagnosis not present

## 2021-12-27 DIAGNOSIS — D631 Anemia in chronic kidney disease: Secondary | ICD-10-CM | POA: Diagnosis not present

## 2021-12-27 DIAGNOSIS — N2581 Secondary hyperparathyroidism of renal origin: Secondary | ICD-10-CM | POA: Diagnosis not present

## 2021-12-27 DIAGNOSIS — N186 End stage renal disease: Secondary | ICD-10-CM | POA: Diagnosis not present

## 2021-12-27 DIAGNOSIS — Z992 Dependence on renal dialysis: Secondary | ICD-10-CM | POA: Diagnosis not present

## 2021-12-28 DIAGNOSIS — Z992 Dependence on renal dialysis: Secondary | ICD-10-CM | POA: Diagnosis not present

## 2021-12-28 DIAGNOSIS — D631 Anemia in chronic kidney disease: Secondary | ICD-10-CM | POA: Diagnosis not present

## 2021-12-28 DIAGNOSIS — N2581 Secondary hyperparathyroidism of renal origin: Secondary | ICD-10-CM | POA: Diagnosis not present

## 2021-12-28 DIAGNOSIS — N186 End stage renal disease: Secondary | ICD-10-CM | POA: Diagnosis not present

## 2021-12-29 DIAGNOSIS — D631 Anemia in chronic kidney disease: Secondary | ICD-10-CM | POA: Diagnosis not present

## 2021-12-29 DIAGNOSIS — N186 End stage renal disease: Secondary | ICD-10-CM | POA: Diagnosis not present

## 2021-12-29 DIAGNOSIS — Z992 Dependence on renal dialysis: Secondary | ICD-10-CM | POA: Diagnosis not present

## 2021-12-29 DIAGNOSIS — N2581 Secondary hyperparathyroidism of renal origin: Secondary | ICD-10-CM | POA: Diagnosis not present

## 2021-12-30 DIAGNOSIS — Z992 Dependence on renal dialysis: Secondary | ICD-10-CM | POA: Diagnosis not present

## 2021-12-30 DIAGNOSIS — D631 Anemia in chronic kidney disease: Secondary | ICD-10-CM | POA: Diagnosis not present

## 2021-12-30 DIAGNOSIS — N2581 Secondary hyperparathyroidism of renal origin: Secondary | ICD-10-CM | POA: Diagnosis not present

## 2021-12-30 DIAGNOSIS — N186 End stage renal disease: Secondary | ICD-10-CM | POA: Diagnosis not present

## 2021-12-31 DIAGNOSIS — N2581 Secondary hyperparathyroidism of renal origin: Secondary | ICD-10-CM | POA: Diagnosis not present

## 2021-12-31 DIAGNOSIS — N186 End stage renal disease: Secondary | ICD-10-CM | POA: Diagnosis not present

## 2021-12-31 DIAGNOSIS — D631 Anemia in chronic kidney disease: Secondary | ICD-10-CM | POA: Diagnosis not present

## 2021-12-31 DIAGNOSIS — Z992 Dependence on renal dialysis: Secondary | ICD-10-CM | POA: Diagnosis not present

## 2022-01-01 DIAGNOSIS — N186 End stage renal disease: Secondary | ICD-10-CM | POA: Diagnosis not present

## 2022-01-01 DIAGNOSIS — N2581 Secondary hyperparathyroidism of renal origin: Secondary | ICD-10-CM | POA: Diagnosis not present

## 2022-01-01 DIAGNOSIS — Z992 Dependence on renal dialysis: Secondary | ICD-10-CM | POA: Diagnosis not present

## 2022-01-01 DIAGNOSIS — D631 Anemia in chronic kidney disease: Secondary | ICD-10-CM | POA: Diagnosis not present

## 2022-01-02 DIAGNOSIS — D631 Anemia in chronic kidney disease: Secondary | ICD-10-CM | POA: Diagnosis not present

## 2022-01-02 DIAGNOSIS — N186 End stage renal disease: Secondary | ICD-10-CM | POA: Diagnosis not present

## 2022-01-02 DIAGNOSIS — Z992 Dependence on renal dialysis: Secondary | ICD-10-CM | POA: Diagnosis not present

## 2022-01-02 DIAGNOSIS — N2581 Secondary hyperparathyroidism of renal origin: Secondary | ICD-10-CM | POA: Diagnosis not present

## 2022-01-03 DIAGNOSIS — Z992 Dependence on renal dialysis: Secondary | ICD-10-CM | POA: Diagnosis not present

## 2022-01-03 DIAGNOSIS — N2581 Secondary hyperparathyroidism of renal origin: Secondary | ICD-10-CM | POA: Diagnosis not present

## 2022-01-03 DIAGNOSIS — N186 End stage renal disease: Secondary | ICD-10-CM | POA: Diagnosis not present

## 2022-01-03 DIAGNOSIS — D631 Anemia in chronic kidney disease: Secondary | ICD-10-CM | POA: Diagnosis not present

## 2022-01-04 DIAGNOSIS — D631 Anemia in chronic kidney disease: Secondary | ICD-10-CM | POA: Diagnosis not present

## 2022-01-04 DIAGNOSIS — N2581 Secondary hyperparathyroidism of renal origin: Secondary | ICD-10-CM | POA: Diagnosis not present

## 2022-01-04 DIAGNOSIS — N186 End stage renal disease: Secondary | ICD-10-CM | POA: Diagnosis not present

## 2022-01-04 DIAGNOSIS — Z992 Dependence on renal dialysis: Secondary | ICD-10-CM | POA: Diagnosis not present

## 2022-01-05 DIAGNOSIS — Z992 Dependence on renal dialysis: Secondary | ICD-10-CM | POA: Diagnosis not present

## 2022-01-05 DIAGNOSIS — D631 Anemia in chronic kidney disease: Secondary | ICD-10-CM | POA: Diagnosis not present

## 2022-01-05 DIAGNOSIS — N2581 Secondary hyperparathyroidism of renal origin: Secondary | ICD-10-CM | POA: Diagnosis not present

## 2022-01-05 DIAGNOSIS — N186 End stage renal disease: Secondary | ICD-10-CM | POA: Diagnosis not present

## 2022-01-06 DIAGNOSIS — N2581 Secondary hyperparathyroidism of renal origin: Secondary | ICD-10-CM | POA: Diagnosis not present

## 2022-01-06 DIAGNOSIS — D509 Iron deficiency anemia, unspecified: Secondary | ICD-10-CM | POA: Diagnosis not present

## 2022-01-06 DIAGNOSIS — D631 Anemia in chronic kidney disease: Secondary | ICD-10-CM | POA: Diagnosis not present

## 2022-01-06 DIAGNOSIS — N186 End stage renal disease: Secondary | ICD-10-CM | POA: Diagnosis not present

## 2022-01-06 DIAGNOSIS — Z992 Dependence on renal dialysis: Secondary | ICD-10-CM | POA: Diagnosis not present

## 2022-01-07 DIAGNOSIS — N2581 Secondary hyperparathyroidism of renal origin: Secondary | ICD-10-CM | POA: Diagnosis not present

## 2022-01-07 DIAGNOSIS — N186 End stage renal disease: Secondary | ICD-10-CM | POA: Diagnosis not present

## 2022-01-07 DIAGNOSIS — D509 Iron deficiency anemia, unspecified: Secondary | ICD-10-CM | POA: Diagnosis not present

## 2022-01-07 DIAGNOSIS — D631 Anemia in chronic kidney disease: Secondary | ICD-10-CM | POA: Diagnosis not present

## 2022-01-07 DIAGNOSIS — Z992 Dependence on renal dialysis: Secondary | ICD-10-CM | POA: Diagnosis not present

## 2022-01-08 DIAGNOSIS — N2581 Secondary hyperparathyroidism of renal origin: Secondary | ICD-10-CM | POA: Diagnosis not present

## 2022-01-08 DIAGNOSIS — N186 End stage renal disease: Secondary | ICD-10-CM | POA: Diagnosis not present

## 2022-01-08 DIAGNOSIS — D509 Iron deficiency anemia, unspecified: Secondary | ICD-10-CM | POA: Diagnosis not present

## 2022-01-08 DIAGNOSIS — D631 Anemia in chronic kidney disease: Secondary | ICD-10-CM | POA: Diagnosis not present

## 2022-01-08 DIAGNOSIS — Z992 Dependence on renal dialysis: Secondary | ICD-10-CM | POA: Diagnosis not present

## 2022-01-09 DIAGNOSIS — N186 End stage renal disease: Secondary | ICD-10-CM | POA: Diagnosis not present

## 2022-01-09 DIAGNOSIS — N2581 Secondary hyperparathyroidism of renal origin: Secondary | ICD-10-CM | POA: Diagnosis not present

## 2022-01-09 DIAGNOSIS — D509 Iron deficiency anemia, unspecified: Secondary | ICD-10-CM | POA: Diagnosis not present

## 2022-01-09 DIAGNOSIS — D631 Anemia in chronic kidney disease: Secondary | ICD-10-CM | POA: Diagnosis not present

## 2022-01-09 DIAGNOSIS — Z992 Dependence on renal dialysis: Secondary | ICD-10-CM | POA: Diagnosis not present

## 2022-01-10 DIAGNOSIS — N186 End stage renal disease: Secondary | ICD-10-CM | POA: Diagnosis not present

## 2022-01-10 DIAGNOSIS — Z992 Dependence on renal dialysis: Secondary | ICD-10-CM | POA: Diagnosis not present

## 2022-01-10 DIAGNOSIS — N2581 Secondary hyperparathyroidism of renal origin: Secondary | ICD-10-CM | POA: Diagnosis not present

## 2022-01-10 DIAGNOSIS — D509 Iron deficiency anemia, unspecified: Secondary | ICD-10-CM | POA: Diagnosis not present

## 2022-01-10 DIAGNOSIS — D631 Anemia in chronic kidney disease: Secondary | ICD-10-CM | POA: Diagnosis not present

## 2022-01-11 DIAGNOSIS — Z992 Dependence on renal dialysis: Secondary | ICD-10-CM | POA: Diagnosis not present

## 2022-01-11 DIAGNOSIS — D509 Iron deficiency anemia, unspecified: Secondary | ICD-10-CM | POA: Diagnosis not present

## 2022-01-11 DIAGNOSIS — N186 End stage renal disease: Secondary | ICD-10-CM | POA: Diagnosis not present

## 2022-01-11 DIAGNOSIS — D631 Anemia in chronic kidney disease: Secondary | ICD-10-CM | POA: Diagnosis not present

## 2022-01-11 DIAGNOSIS — N2581 Secondary hyperparathyroidism of renal origin: Secondary | ICD-10-CM | POA: Diagnosis not present

## 2022-01-12 DIAGNOSIS — N186 End stage renal disease: Secondary | ICD-10-CM | POA: Diagnosis not present

## 2022-01-12 DIAGNOSIS — D509 Iron deficiency anemia, unspecified: Secondary | ICD-10-CM | POA: Diagnosis not present

## 2022-01-12 DIAGNOSIS — N2581 Secondary hyperparathyroidism of renal origin: Secondary | ICD-10-CM | POA: Diagnosis not present

## 2022-01-12 DIAGNOSIS — Z992 Dependence on renal dialysis: Secondary | ICD-10-CM | POA: Diagnosis not present

## 2022-01-12 DIAGNOSIS — D631 Anemia in chronic kidney disease: Secondary | ICD-10-CM | POA: Diagnosis not present

## 2022-01-13 DIAGNOSIS — D509 Iron deficiency anemia, unspecified: Secondary | ICD-10-CM | POA: Diagnosis not present

## 2022-01-13 DIAGNOSIS — D631 Anemia in chronic kidney disease: Secondary | ICD-10-CM | POA: Diagnosis not present

## 2022-01-13 DIAGNOSIS — N2581 Secondary hyperparathyroidism of renal origin: Secondary | ICD-10-CM | POA: Diagnosis not present

## 2022-01-13 DIAGNOSIS — N186 End stage renal disease: Secondary | ICD-10-CM | POA: Diagnosis not present

## 2022-01-13 DIAGNOSIS — Z992 Dependence on renal dialysis: Secondary | ICD-10-CM | POA: Diagnosis not present

## 2022-01-14 DIAGNOSIS — L02612 Cutaneous abscess of left foot: Secondary | ICD-10-CM | POA: Diagnosis not present

## 2022-01-14 DIAGNOSIS — L97509 Non-pressure chronic ulcer of other part of unspecified foot with unspecified severity: Secondary | ICD-10-CM | POA: Diagnosis not present

## 2022-01-14 DIAGNOSIS — N2581 Secondary hyperparathyroidism of renal origin: Secondary | ICD-10-CM | POA: Diagnosis not present

## 2022-01-14 DIAGNOSIS — Z89422 Acquired absence of other left toe(s): Secondary | ICD-10-CM | POA: Diagnosis not present

## 2022-01-14 DIAGNOSIS — Z992 Dependence on renal dialysis: Secondary | ICD-10-CM | POA: Diagnosis not present

## 2022-01-14 DIAGNOSIS — D631 Anemia in chronic kidney disease: Secondary | ICD-10-CM | POA: Diagnosis not present

## 2022-01-14 DIAGNOSIS — Z794 Long term (current) use of insulin: Secondary | ICD-10-CM | POA: Diagnosis not present

## 2022-01-14 DIAGNOSIS — D509 Iron deficiency anemia, unspecified: Secondary | ICD-10-CM | POA: Diagnosis not present

## 2022-01-14 DIAGNOSIS — E11621 Type 2 diabetes mellitus with foot ulcer: Secondary | ICD-10-CM | POA: Diagnosis not present

## 2022-01-14 DIAGNOSIS — N186 End stage renal disease: Secondary | ICD-10-CM | POA: Diagnosis not present

## 2022-01-15 ENCOUNTER — Encounter: Payer: Self-pay | Admitting: Podiatry

## 2022-01-15 DIAGNOSIS — N186 End stage renal disease: Secondary | ICD-10-CM | POA: Diagnosis not present

## 2022-01-15 DIAGNOSIS — D509 Iron deficiency anemia, unspecified: Secondary | ICD-10-CM | POA: Diagnosis not present

## 2022-01-15 DIAGNOSIS — D631 Anemia in chronic kidney disease: Secondary | ICD-10-CM | POA: Diagnosis not present

## 2022-01-15 DIAGNOSIS — N2581 Secondary hyperparathyroidism of renal origin: Secondary | ICD-10-CM | POA: Diagnosis not present

## 2022-01-15 DIAGNOSIS — Z992 Dependence on renal dialysis: Secondary | ICD-10-CM | POA: Diagnosis not present

## 2022-01-16 DIAGNOSIS — D509 Iron deficiency anemia, unspecified: Secondary | ICD-10-CM | POA: Diagnosis not present

## 2022-01-16 DIAGNOSIS — N2581 Secondary hyperparathyroidism of renal origin: Secondary | ICD-10-CM | POA: Diagnosis not present

## 2022-01-16 DIAGNOSIS — D631 Anemia in chronic kidney disease: Secondary | ICD-10-CM | POA: Diagnosis not present

## 2022-01-16 DIAGNOSIS — N186 End stage renal disease: Secondary | ICD-10-CM | POA: Diagnosis not present

## 2022-01-16 DIAGNOSIS — Z992 Dependence on renal dialysis: Secondary | ICD-10-CM | POA: Diagnosis not present

## 2022-01-17 DIAGNOSIS — D631 Anemia in chronic kidney disease: Secondary | ICD-10-CM | POA: Diagnosis not present

## 2022-01-17 DIAGNOSIS — N2581 Secondary hyperparathyroidism of renal origin: Secondary | ICD-10-CM | POA: Diagnosis not present

## 2022-01-17 DIAGNOSIS — N186 End stage renal disease: Secondary | ICD-10-CM | POA: Diagnosis not present

## 2022-01-17 DIAGNOSIS — D509 Iron deficiency anemia, unspecified: Secondary | ICD-10-CM | POA: Diagnosis not present

## 2022-01-17 DIAGNOSIS — Z992 Dependence on renal dialysis: Secondary | ICD-10-CM | POA: Diagnosis not present

## 2022-01-18 DIAGNOSIS — D509 Iron deficiency anemia, unspecified: Secondary | ICD-10-CM | POA: Diagnosis not present

## 2022-01-18 DIAGNOSIS — N2581 Secondary hyperparathyroidism of renal origin: Secondary | ICD-10-CM | POA: Diagnosis not present

## 2022-01-18 DIAGNOSIS — Z992 Dependence on renal dialysis: Secondary | ICD-10-CM | POA: Diagnosis not present

## 2022-01-18 DIAGNOSIS — D631 Anemia in chronic kidney disease: Secondary | ICD-10-CM | POA: Diagnosis not present

## 2022-01-18 DIAGNOSIS — N186 End stage renal disease: Secondary | ICD-10-CM | POA: Diagnosis not present

## 2022-01-19 DIAGNOSIS — D509 Iron deficiency anemia, unspecified: Secondary | ICD-10-CM | POA: Diagnosis not present

## 2022-01-19 DIAGNOSIS — N2581 Secondary hyperparathyroidism of renal origin: Secondary | ICD-10-CM | POA: Diagnosis not present

## 2022-01-19 DIAGNOSIS — D631 Anemia in chronic kidney disease: Secondary | ICD-10-CM | POA: Diagnosis not present

## 2022-01-19 DIAGNOSIS — N186 End stage renal disease: Secondary | ICD-10-CM | POA: Diagnosis not present

## 2022-01-19 DIAGNOSIS — Z992 Dependence on renal dialysis: Secondary | ICD-10-CM | POA: Diagnosis not present

## 2022-01-20 DIAGNOSIS — Z992 Dependence on renal dialysis: Secondary | ICD-10-CM | POA: Diagnosis not present

## 2022-01-20 DIAGNOSIS — D631 Anemia in chronic kidney disease: Secondary | ICD-10-CM | POA: Diagnosis not present

## 2022-01-20 DIAGNOSIS — N2581 Secondary hyperparathyroidism of renal origin: Secondary | ICD-10-CM | POA: Diagnosis not present

## 2022-01-20 DIAGNOSIS — N186 End stage renal disease: Secondary | ICD-10-CM | POA: Diagnosis not present

## 2022-01-20 DIAGNOSIS — D509 Iron deficiency anemia, unspecified: Secondary | ICD-10-CM | POA: Diagnosis not present

## 2022-01-21 DIAGNOSIS — N186 End stage renal disease: Secondary | ICD-10-CM | POA: Diagnosis not present

## 2022-01-21 DIAGNOSIS — D631 Anemia in chronic kidney disease: Secondary | ICD-10-CM | POA: Diagnosis not present

## 2022-01-21 DIAGNOSIS — N2581 Secondary hyperparathyroidism of renal origin: Secondary | ICD-10-CM | POA: Diagnosis not present

## 2022-01-21 DIAGNOSIS — Z992 Dependence on renal dialysis: Secondary | ICD-10-CM | POA: Diagnosis not present

## 2022-01-21 DIAGNOSIS — D509 Iron deficiency anemia, unspecified: Secondary | ICD-10-CM | POA: Diagnosis not present

## 2022-01-22 DIAGNOSIS — D631 Anemia in chronic kidney disease: Secondary | ICD-10-CM | POA: Diagnosis not present

## 2022-01-22 DIAGNOSIS — N2581 Secondary hyperparathyroidism of renal origin: Secondary | ICD-10-CM | POA: Diagnosis not present

## 2022-01-22 DIAGNOSIS — D509 Iron deficiency anemia, unspecified: Secondary | ICD-10-CM | POA: Diagnosis not present

## 2022-01-22 DIAGNOSIS — N186 End stage renal disease: Secondary | ICD-10-CM | POA: Diagnosis not present

## 2022-01-22 DIAGNOSIS — Z992 Dependence on renal dialysis: Secondary | ICD-10-CM | POA: Diagnosis not present

## 2022-01-23 DIAGNOSIS — Z992 Dependence on renal dialysis: Secondary | ICD-10-CM | POA: Diagnosis not present

## 2022-01-23 DIAGNOSIS — N186 End stage renal disease: Secondary | ICD-10-CM | POA: Diagnosis not present

## 2022-01-23 DIAGNOSIS — N2581 Secondary hyperparathyroidism of renal origin: Secondary | ICD-10-CM | POA: Diagnosis not present

## 2022-01-23 DIAGNOSIS — D509 Iron deficiency anemia, unspecified: Secondary | ICD-10-CM | POA: Diagnosis not present

## 2022-01-23 DIAGNOSIS — D631 Anemia in chronic kidney disease: Secondary | ICD-10-CM | POA: Diagnosis not present

## 2022-01-24 DIAGNOSIS — Z992 Dependence on renal dialysis: Secondary | ICD-10-CM | POA: Diagnosis not present

## 2022-01-24 DIAGNOSIS — N2581 Secondary hyperparathyroidism of renal origin: Secondary | ICD-10-CM | POA: Diagnosis not present

## 2022-01-24 DIAGNOSIS — D631 Anemia in chronic kidney disease: Secondary | ICD-10-CM | POA: Diagnosis not present

## 2022-01-24 DIAGNOSIS — D509 Iron deficiency anemia, unspecified: Secondary | ICD-10-CM | POA: Diagnosis not present

## 2022-01-24 DIAGNOSIS — N186 End stage renal disease: Secondary | ICD-10-CM | POA: Diagnosis not present

## 2022-01-25 DIAGNOSIS — D631 Anemia in chronic kidney disease: Secondary | ICD-10-CM | POA: Diagnosis not present

## 2022-01-25 DIAGNOSIS — N2581 Secondary hyperparathyroidism of renal origin: Secondary | ICD-10-CM | POA: Diagnosis not present

## 2022-01-25 DIAGNOSIS — N186 End stage renal disease: Secondary | ICD-10-CM | POA: Diagnosis not present

## 2022-01-25 DIAGNOSIS — D509 Iron deficiency anemia, unspecified: Secondary | ICD-10-CM | POA: Diagnosis not present

## 2022-01-25 DIAGNOSIS — Z992 Dependence on renal dialysis: Secondary | ICD-10-CM | POA: Diagnosis not present

## 2022-01-26 DIAGNOSIS — N186 End stage renal disease: Secondary | ICD-10-CM | POA: Diagnosis not present

## 2022-01-26 DIAGNOSIS — D509 Iron deficiency anemia, unspecified: Secondary | ICD-10-CM | POA: Diagnosis not present

## 2022-01-26 DIAGNOSIS — D631 Anemia in chronic kidney disease: Secondary | ICD-10-CM | POA: Diagnosis not present

## 2022-01-26 DIAGNOSIS — Z992 Dependence on renal dialysis: Secondary | ICD-10-CM | POA: Diagnosis not present

## 2022-01-26 DIAGNOSIS — N2581 Secondary hyperparathyroidism of renal origin: Secondary | ICD-10-CM | POA: Diagnosis not present

## 2022-01-27 DIAGNOSIS — N2581 Secondary hyperparathyroidism of renal origin: Secondary | ICD-10-CM | POA: Diagnosis not present

## 2022-01-27 DIAGNOSIS — D509 Iron deficiency anemia, unspecified: Secondary | ICD-10-CM | POA: Diagnosis not present

## 2022-01-27 DIAGNOSIS — Z992 Dependence on renal dialysis: Secondary | ICD-10-CM | POA: Diagnosis not present

## 2022-01-27 DIAGNOSIS — D631 Anemia in chronic kidney disease: Secondary | ICD-10-CM | POA: Diagnosis not present

## 2022-01-27 DIAGNOSIS — N186 End stage renal disease: Secondary | ICD-10-CM | POA: Diagnosis not present

## 2022-01-28 DIAGNOSIS — N186 End stage renal disease: Secondary | ICD-10-CM | POA: Diagnosis not present

## 2022-01-28 DIAGNOSIS — Z992 Dependence on renal dialysis: Secondary | ICD-10-CM | POA: Diagnosis not present

## 2022-01-28 DIAGNOSIS — N2581 Secondary hyperparathyroidism of renal origin: Secondary | ICD-10-CM | POA: Diagnosis not present

## 2022-01-28 DIAGNOSIS — D631 Anemia in chronic kidney disease: Secondary | ICD-10-CM | POA: Diagnosis not present

## 2022-01-28 DIAGNOSIS — D509 Iron deficiency anemia, unspecified: Secondary | ICD-10-CM | POA: Diagnosis not present

## 2022-01-29 DIAGNOSIS — Z992 Dependence on renal dialysis: Secondary | ICD-10-CM | POA: Diagnosis not present

## 2022-01-29 DIAGNOSIS — D509 Iron deficiency anemia, unspecified: Secondary | ICD-10-CM | POA: Diagnosis not present

## 2022-01-29 DIAGNOSIS — N2581 Secondary hyperparathyroidism of renal origin: Secondary | ICD-10-CM | POA: Diagnosis not present

## 2022-01-29 DIAGNOSIS — N186 End stage renal disease: Secondary | ICD-10-CM | POA: Diagnosis not present

## 2022-01-29 DIAGNOSIS — D631 Anemia in chronic kidney disease: Secondary | ICD-10-CM | POA: Diagnosis not present

## 2022-01-30 DIAGNOSIS — D509 Iron deficiency anemia, unspecified: Secondary | ICD-10-CM | POA: Diagnosis not present

## 2022-01-30 DIAGNOSIS — N2581 Secondary hyperparathyroidism of renal origin: Secondary | ICD-10-CM | POA: Diagnosis not present

## 2022-01-30 DIAGNOSIS — D631 Anemia in chronic kidney disease: Secondary | ICD-10-CM | POA: Diagnosis not present

## 2022-01-30 DIAGNOSIS — Z992 Dependence on renal dialysis: Secondary | ICD-10-CM | POA: Diagnosis not present

## 2022-01-30 DIAGNOSIS — N186 End stage renal disease: Secondary | ICD-10-CM | POA: Diagnosis not present

## 2022-01-31 DIAGNOSIS — D509 Iron deficiency anemia, unspecified: Secondary | ICD-10-CM | POA: Diagnosis not present

## 2022-01-31 DIAGNOSIS — N2581 Secondary hyperparathyroidism of renal origin: Secondary | ICD-10-CM | POA: Diagnosis not present

## 2022-01-31 DIAGNOSIS — D631 Anemia in chronic kidney disease: Secondary | ICD-10-CM | POA: Diagnosis not present

## 2022-01-31 DIAGNOSIS — N186 End stage renal disease: Secondary | ICD-10-CM | POA: Diagnosis not present

## 2022-01-31 DIAGNOSIS — Z992 Dependence on renal dialysis: Secondary | ICD-10-CM | POA: Diagnosis not present

## 2022-02-01 DIAGNOSIS — N2581 Secondary hyperparathyroidism of renal origin: Secondary | ICD-10-CM | POA: Diagnosis not present

## 2022-02-01 DIAGNOSIS — D509 Iron deficiency anemia, unspecified: Secondary | ICD-10-CM | POA: Diagnosis not present

## 2022-02-01 DIAGNOSIS — N186 End stage renal disease: Secondary | ICD-10-CM | POA: Diagnosis not present

## 2022-02-01 DIAGNOSIS — Z992 Dependence on renal dialysis: Secondary | ICD-10-CM | POA: Diagnosis not present

## 2022-02-01 DIAGNOSIS — D631 Anemia in chronic kidney disease: Secondary | ICD-10-CM | POA: Diagnosis not present

## 2022-02-02 DIAGNOSIS — D509 Iron deficiency anemia, unspecified: Secondary | ICD-10-CM | POA: Diagnosis not present

## 2022-02-02 DIAGNOSIS — D631 Anemia in chronic kidney disease: Secondary | ICD-10-CM | POA: Diagnosis not present

## 2022-02-02 DIAGNOSIS — Z992 Dependence on renal dialysis: Secondary | ICD-10-CM | POA: Diagnosis not present

## 2022-02-02 DIAGNOSIS — N186 End stage renal disease: Secondary | ICD-10-CM | POA: Diagnosis not present

## 2022-02-02 DIAGNOSIS — N2581 Secondary hyperparathyroidism of renal origin: Secondary | ICD-10-CM | POA: Diagnosis not present

## 2022-02-03 DIAGNOSIS — D631 Anemia in chronic kidney disease: Secondary | ICD-10-CM | POA: Diagnosis not present

## 2022-02-03 DIAGNOSIS — Z992 Dependence on renal dialysis: Secondary | ICD-10-CM | POA: Diagnosis not present

## 2022-02-03 DIAGNOSIS — D509 Iron deficiency anemia, unspecified: Secondary | ICD-10-CM | POA: Diagnosis not present

## 2022-02-03 DIAGNOSIS — N2581 Secondary hyperparathyroidism of renal origin: Secondary | ICD-10-CM | POA: Diagnosis not present

## 2022-02-03 DIAGNOSIS — N186 End stage renal disease: Secondary | ICD-10-CM | POA: Diagnosis not present

## 2022-02-04 DIAGNOSIS — N186 End stage renal disease: Secondary | ICD-10-CM | POA: Diagnosis not present

## 2022-02-04 DIAGNOSIS — Z992 Dependence on renal dialysis: Secondary | ICD-10-CM | POA: Diagnosis not present

## 2022-02-04 DIAGNOSIS — D631 Anemia in chronic kidney disease: Secondary | ICD-10-CM | POA: Diagnosis not present

## 2022-02-04 DIAGNOSIS — N2581 Secondary hyperparathyroidism of renal origin: Secondary | ICD-10-CM | POA: Diagnosis not present

## 2022-02-04 DIAGNOSIS — D509 Iron deficiency anemia, unspecified: Secondary | ICD-10-CM | POA: Diagnosis not present

## 2022-02-05 DIAGNOSIS — N186 End stage renal disease: Secondary | ICD-10-CM | POA: Diagnosis not present

## 2022-02-05 DIAGNOSIS — N2581 Secondary hyperparathyroidism of renal origin: Secondary | ICD-10-CM | POA: Diagnosis not present

## 2022-02-05 DIAGNOSIS — Z992 Dependence on renal dialysis: Secondary | ICD-10-CM | POA: Diagnosis not present

## 2022-02-05 DIAGNOSIS — D631 Anemia in chronic kidney disease: Secondary | ICD-10-CM | POA: Diagnosis not present

## 2022-02-05 DIAGNOSIS — D509 Iron deficiency anemia, unspecified: Secondary | ICD-10-CM | POA: Diagnosis not present

## 2022-02-06 DIAGNOSIS — Z992 Dependence on renal dialysis: Secondary | ICD-10-CM | POA: Diagnosis not present

## 2022-02-06 DIAGNOSIS — Z4932 Encounter for adequacy testing for peritoneal dialysis: Secondary | ICD-10-CM | POA: Diagnosis not present

## 2022-02-06 DIAGNOSIS — N2581 Secondary hyperparathyroidism of renal origin: Secondary | ICD-10-CM | POA: Diagnosis not present

## 2022-02-06 DIAGNOSIS — N186 End stage renal disease: Secondary | ICD-10-CM | POA: Diagnosis not present

## 2022-02-06 DIAGNOSIS — D631 Anemia in chronic kidney disease: Secondary | ICD-10-CM | POA: Diagnosis not present

## 2022-02-07 DIAGNOSIS — Z4932 Encounter for adequacy testing for peritoneal dialysis: Secondary | ICD-10-CM | POA: Diagnosis not present

## 2022-02-07 DIAGNOSIS — N186 End stage renal disease: Secondary | ICD-10-CM | POA: Diagnosis not present

## 2022-02-07 DIAGNOSIS — N2581 Secondary hyperparathyroidism of renal origin: Secondary | ICD-10-CM | POA: Diagnosis not present

## 2022-02-07 DIAGNOSIS — Z992 Dependence on renal dialysis: Secondary | ICD-10-CM | POA: Diagnosis not present

## 2022-02-07 DIAGNOSIS — D631 Anemia in chronic kidney disease: Secondary | ICD-10-CM | POA: Diagnosis not present

## 2022-02-08 DIAGNOSIS — D631 Anemia in chronic kidney disease: Secondary | ICD-10-CM | POA: Diagnosis not present

## 2022-02-08 DIAGNOSIS — N186 End stage renal disease: Secondary | ICD-10-CM | POA: Diagnosis not present

## 2022-02-08 DIAGNOSIS — Z4932 Encounter for adequacy testing for peritoneal dialysis: Secondary | ICD-10-CM | POA: Diagnosis not present

## 2022-02-08 DIAGNOSIS — Z992 Dependence on renal dialysis: Secondary | ICD-10-CM | POA: Diagnosis not present

## 2022-02-08 DIAGNOSIS — N2581 Secondary hyperparathyroidism of renal origin: Secondary | ICD-10-CM | POA: Diagnosis not present

## 2022-02-09 DIAGNOSIS — D631 Anemia in chronic kidney disease: Secondary | ICD-10-CM | POA: Diagnosis not present

## 2022-02-09 DIAGNOSIS — Z992 Dependence on renal dialysis: Secondary | ICD-10-CM | POA: Diagnosis not present

## 2022-02-09 DIAGNOSIS — N2581 Secondary hyperparathyroidism of renal origin: Secondary | ICD-10-CM | POA: Diagnosis not present

## 2022-02-09 DIAGNOSIS — N186 End stage renal disease: Secondary | ICD-10-CM | POA: Diagnosis not present

## 2022-02-09 DIAGNOSIS — Z4932 Encounter for adequacy testing for peritoneal dialysis: Secondary | ICD-10-CM | POA: Diagnosis not present

## 2022-02-10 DIAGNOSIS — D631 Anemia in chronic kidney disease: Secondary | ICD-10-CM | POA: Diagnosis not present

## 2022-02-10 DIAGNOSIS — N186 End stage renal disease: Secondary | ICD-10-CM | POA: Diagnosis not present

## 2022-02-10 DIAGNOSIS — N2581 Secondary hyperparathyroidism of renal origin: Secondary | ICD-10-CM | POA: Diagnosis not present

## 2022-02-10 DIAGNOSIS — Z4932 Encounter for adequacy testing for peritoneal dialysis: Secondary | ICD-10-CM | POA: Diagnosis not present

## 2022-02-10 DIAGNOSIS — Z992 Dependence on renal dialysis: Secondary | ICD-10-CM | POA: Diagnosis not present

## 2022-02-11 DIAGNOSIS — N186 End stage renal disease: Secondary | ICD-10-CM | POA: Diagnosis not present

## 2022-02-11 DIAGNOSIS — Z992 Dependence on renal dialysis: Secondary | ICD-10-CM | POA: Diagnosis not present

## 2022-02-11 DIAGNOSIS — Z4932 Encounter for adequacy testing for peritoneal dialysis: Secondary | ICD-10-CM | POA: Diagnosis not present

## 2022-02-11 DIAGNOSIS — D631 Anemia in chronic kidney disease: Secondary | ICD-10-CM | POA: Diagnosis not present

## 2022-02-11 DIAGNOSIS — N2581 Secondary hyperparathyroidism of renal origin: Secondary | ICD-10-CM | POA: Diagnosis not present

## 2022-02-12 DIAGNOSIS — N186 End stage renal disease: Secondary | ICD-10-CM | POA: Diagnosis not present

## 2022-02-12 DIAGNOSIS — N2581 Secondary hyperparathyroidism of renal origin: Secondary | ICD-10-CM | POA: Diagnosis not present

## 2022-02-12 DIAGNOSIS — Z4932 Encounter for adequacy testing for peritoneal dialysis: Secondary | ICD-10-CM | POA: Diagnosis not present

## 2022-02-12 DIAGNOSIS — Z992 Dependence on renal dialysis: Secondary | ICD-10-CM | POA: Diagnosis not present

## 2022-02-12 DIAGNOSIS — D631 Anemia in chronic kidney disease: Secondary | ICD-10-CM | POA: Diagnosis not present

## 2022-02-13 DIAGNOSIS — N186 End stage renal disease: Secondary | ICD-10-CM | POA: Diagnosis not present

## 2022-02-13 DIAGNOSIS — Z4932 Encounter for adequacy testing for peritoneal dialysis: Secondary | ICD-10-CM | POA: Diagnosis not present

## 2022-02-13 DIAGNOSIS — N2581 Secondary hyperparathyroidism of renal origin: Secondary | ICD-10-CM | POA: Diagnosis not present

## 2022-02-13 DIAGNOSIS — Z992 Dependence on renal dialysis: Secondary | ICD-10-CM | POA: Diagnosis not present

## 2022-02-13 DIAGNOSIS — D631 Anemia in chronic kidney disease: Secondary | ICD-10-CM | POA: Diagnosis not present

## 2022-02-14 DIAGNOSIS — N2581 Secondary hyperparathyroidism of renal origin: Secondary | ICD-10-CM | POA: Diagnosis not present

## 2022-02-14 DIAGNOSIS — D631 Anemia in chronic kidney disease: Secondary | ICD-10-CM | POA: Diagnosis not present

## 2022-02-14 DIAGNOSIS — Z4932 Encounter for adequacy testing for peritoneal dialysis: Secondary | ICD-10-CM | POA: Diagnosis not present

## 2022-02-14 DIAGNOSIS — Z992 Dependence on renal dialysis: Secondary | ICD-10-CM | POA: Diagnosis not present

## 2022-02-14 DIAGNOSIS — N186 End stage renal disease: Secondary | ICD-10-CM | POA: Diagnosis not present

## 2022-02-15 DIAGNOSIS — Z992 Dependence on renal dialysis: Secondary | ICD-10-CM | POA: Diagnosis not present

## 2022-02-15 DIAGNOSIS — Z4932 Encounter for adequacy testing for peritoneal dialysis: Secondary | ICD-10-CM | POA: Diagnosis not present

## 2022-02-15 DIAGNOSIS — N186 End stage renal disease: Secondary | ICD-10-CM | POA: Diagnosis not present

## 2022-02-15 DIAGNOSIS — N2581 Secondary hyperparathyroidism of renal origin: Secondary | ICD-10-CM | POA: Diagnosis not present

## 2022-02-15 DIAGNOSIS — D631 Anemia in chronic kidney disease: Secondary | ICD-10-CM | POA: Diagnosis not present

## 2022-02-16 DIAGNOSIS — N2581 Secondary hyperparathyroidism of renal origin: Secondary | ICD-10-CM | POA: Diagnosis not present

## 2022-02-16 DIAGNOSIS — Z992 Dependence on renal dialysis: Secondary | ICD-10-CM | POA: Diagnosis not present

## 2022-02-16 DIAGNOSIS — N186 End stage renal disease: Secondary | ICD-10-CM | POA: Diagnosis not present

## 2022-02-16 DIAGNOSIS — Z4932 Encounter for adequacy testing for peritoneal dialysis: Secondary | ICD-10-CM | POA: Diagnosis not present

## 2022-02-16 DIAGNOSIS — D631 Anemia in chronic kidney disease: Secondary | ICD-10-CM | POA: Diagnosis not present

## 2022-02-17 DIAGNOSIS — Z4932 Encounter for adequacy testing for peritoneal dialysis: Secondary | ICD-10-CM | POA: Diagnosis not present

## 2022-02-17 DIAGNOSIS — N2581 Secondary hyperparathyroidism of renal origin: Secondary | ICD-10-CM | POA: Diagnosis not present

## 2022-02-17 DIAGNOSIS — Z992 Dependence on renal dialysis: Secondary | ICD-10-CM | POA: Diagnosis not present

## 2022-02-17 DIAGNOSIS — D631 Anemia in chronic kidney disease: Secondary | ICD-10-CM | POA: Diagnosis not present

## 2022-02-17 DIAGNOSIS — N186 End stage renal disease: Secondary | ICD-10-CM | POA: Diagnosis not present

## 2022-02-18 DIAGNOSIS — N186 End stage renal disease: Secondary | ICD-10-CM | POA: Diagnosis not present

## 2022-02-18 DIAGNOSIS — Z992 Dependence on renal dialysis: Secondary | ICD-10-CM | POA: Diagnosis not present

## 2022-02-18 DIAGNOSIS — N2581 Secondary hyperparathyroidism of renal origin: Secondary | ICD-10-CM | POA: Diagnosis not present

## 2022-02-18 DIAGNOSIS — D631 Anemia in chronic kidney disease: Secondary | ICD-10-CM | POA: Diagnosis not present

## 2022-02-18 DIAGNOSIS — Z4932 Encounter for adequacy testing for peritoneal dialysis: Secondary | ICD-10-CM | POA: Diagnosis not present

## 2022-02-19 DIAGNOSIS — Z4932 Encounter for adequacy testing for peritoneal dialysis: Secondary | ICD-10-CM | POA: Diagnosis not present

## 2022-02-19 DIAGNOSIS — Z992 Dependence on renal dialysis: Secondary | ICD-10-CM | POA: Diagnosis not present

## 2022-02-19 DIAGNOSIS — N186 End stage renal disease: Secondary | ICD-10-CM | POA: Diagnosis not present

## 2022-02-19 DIAGNOSIS — D631 Anemia in chronic kidney disease: Secondary | ICD-10-CM | POA: Diagnosis not present

## 2022-02-19 DIAGNOSIS — N2581 Secondary hyperparathyroidism of renal origin: Secondary | ICD-10-CM | POA: Diagnosis not present

## 2022-02-20 DIAGNOSIS — N2581 Secondary hyperparathyroidism of renal origin: Secondary | ICD-10-CM | POA: Diagnosis not present

## 2022-02-20 DIAGNOSIS — Z4932 Encounter for adequacy testing for peritoneal dialysis: Secondary | ICD-10-CM | POA: Diagnosis not present

## 2022-02-20 DIAGNOSIS — N186 End stage renal disease: Secondary | ICD-10-CM | POA: Diagnosis not present

## 2022-02-20 DIAGNOSIS — Z992 Dependence on renal dialysis: Secondary | ICD-10-CM | POA: Diagnosis not present

## 2022-02-20 DIAGNOSIS — D631 Anemia in chronic kidney disease: Secondary | ICD-10-CM | POA: Diagnosis not present

## 2022-02-21 DIAGNOSIS — N2581 Secondary hyperparathyroidism of renal origin: Secondary | ICD-10-CM | POA: Diagnosis not present

## 2022-02-21 DIAGNOSIS — D631 Anemia in chronic kidney disease: Secondary | ICD-10-CM | POA: Diagnosis not present

## 2022-02-21 DIAGNOSIS — Z4932 Encounter for adequacy testing for peritoneal dialysis: Secondary | ICD-10-CM | POA: Diagnosis not present

## 2022-02-21 DIAGNOSIS — Z992 Dependence on renal dialysis: Secondary | ICD-10-CM | POA: Diagnosis not present

## 2022-02-21 DIAGNOSIS — N186 End stage renal disease: Secondary | ICD-10-CM | POA: Diagnosis not present

## 2022-02-22 DIAGNOSIS — N186 End stage renal disease: Secondary | ICD-10-CM | POA: Diagnosis not present

## 2022-02-22 DIAGNOSIS — N2581 Secondary hyperparathyroidism of renal origin: Secondary | ICD-10-CM | POA: Diagnosis not present

## 2022-02-22 DIAGNOSIS — D631 Anemia in chronic kidney disease: Secondary | ICD-10-CM | POA: Diagnosis not present

## 2022-02-22 DIAGNOSIS — Z4932 Encounter for adequacy testing for peritoneal dialysis: Secondary | ICD-10-CM | POA: Diagnosis not present

## 2022-02-22 DIAGNOSIS — Z992 Dependence on renal dialysis: Secondary | ICD-10-CM | POA: Diagnosis not present

## 2022-02-23 DIAGNOSIS — Z4932 Encounter for adequacy testing for peritoneal dialysis: Secondary | ICD-10-CM | POA: Diagnosis not present

## 2022-02-23 DIAGNOSIS — D631 Anemia in chronic kidney disease: Secondary | ICD-10-CM | POA: Diagnosis not present

## 2022-02-23 DIAGNOSIS — N2581 Secondary hyperparathyroidism of renal origin: Secondary | ICD-10-CM | POA: Diagnosis not present

## 2022-02-23 DIAGNOSIS — Z992 Dependence on renal dialysis: Secondary | ICD-10-CM | POA: Diagnosis not present

## 2022-02-23 DIAGNOSIS — N186 End stage renal disease: Secondary | ICD-10-CM | POA: Diagnosis not present

## 2022-02-24 DIAGNOSIS — N2581 Secondary hyperparathyroidism of renal origin: Secondary | ICD-10-CM | POA: Diagnosis not present

## 2022-02-24 DIAGNOSIS — Z4932 Encounter for adequacy testing for peritoneal dialysis: Secondary | ICD-10-CM | POA: Diagnosis not present

## 2022-02-24 DIAGNOSIS — Z992 Dependence on renal dialysis: Secondary | ICD-10-CM | POA: Diagnosis not present

## 2022-02-24 DIAGNOSIS — D631 Anemia in chronic kidney disease: Secondary | ICD-10-CM | POA: Diagnosis not present

## 2022-02-24 DIAGNOSIS — N186 End stage renal disease: Secondary | ICD-10-CM | POA: Diagnosis not present

## 2022-02-25 DIAGNOSIS — Z4932 Encounter for adequacy testing for peritoneal dialysis: Secondary | ICD-10-CM | POA: Diagnosis not present

## 2022-02-25 DIAGNOSIS — Z992 Dependence on renal dialysis: Secondary | ICD-10-CM | POA: Diagnosis not present

## 2022-02-25 DIAGNOSIS — N2581 Secondary hyperparathyroidism of renal origin: Secondary | ICD-10-CM | POA: Diagnosis not present

## 2022-02-25 DIAGNOSIS — N186 End stage renal disease: Secondary | ICD-10-CM | POA: Diagnosis not present

## 2022-02-25 DIAGNOSIS — D631 Anemia in chronic kidney disease: Secondary | ICD-10-CM | POA: Diagnosis not present

## 2022-02-26 DIAGNOSIS — D631 Anemia in chronic kidney disease: Secondary | ICD-10-CM | POA: Diagnosis not present

## 2022-02-26 DIAGNOSIS — Z4932 Encounter for adequacy testing for peritoneal dialysis: Secondary | ICD-10-CM | POA: Diagnosis not present

## 2022-02-26 DIAGNOSIS — Z992 Dependence on renal dialysis: Secondary | ICD-10-CM | POA: Diagnosis not present

## 2022-02-26 DIAGNOSIS — N186 End stage renal disease: Secondary | ICD-10-CM | POA: Diagnosis not present

## 2022-02-26 DIAGNOSIS — N2581 Secondary hyperparathyroidism of renal origin: Secondary | ICD-10-CM | POA: Diagnosis not present

## 2022-02-27 DIAGNOSIS — N186 End stage renal disease: Secondary | ICD-10-CM | POA: Diagnosis not present

## 2022-02-27 DIAGNOSIS — D631 Anemia in chronic kidney disease: Secondary | ICD-10-CM | POA: Diagnosis not present

## 2022-02-27 DIAGNOSIS — N2581 Secondary hyperparathyroidism of renal origin: Secondary | ICD-10-CM | POA: Diagnosis not present

## 2022-02-27 DIAGNOSIS — Z4932 Encounter for adequacy testing for peritoneal dialysis: Secondary | ICD-10-CM | POA: Diagnosis not present

## 2022-02-27 DIAGNOSIS — Z992 Dependence on renal dialysis: Secondary | ICD-10-CM | POA: Diagnosis not present

## 2022-02-28 DIAGNOSIS — Z992 Dependence on renal dialysis: Secondary | ICD-10-CM | POA: Diagnosis not present

## 2022-02-28 DIAGNOSIS — N2581 Secondary hyperparathyroidism of renal origin: Secondary | ICD-10-CM | POA: Diagnosis not present

## 2022-02-28 DIAGNOSIS — Z4932 Encounter for adequacy testing for peritoneal dialysis: Secondary | ICD-10-CM | POA: Diagnosis not present

## 2022-02-28 DIAGNOSIS — D631 Anemia in chronic kidney disease: Secondary | ICD-10-CM | POA: Diagnosis not present

## 2022-02-28 DIAGNOSIS — N186 End stage renal disease: Secondary | ICD-10-CM | POA: Diagnosis not present

## 2022-03-01 DIAGNOSIS — N2581 Secondary hyperparathyroidism of renal origin: Secondary | ICD-10-CM | POA: Diagnosis not present

## 2022-03-01 DIAGNOSIS — D631 Anemia in chronic kidney disease: Secondary | ICD-10-CM | POA: Diagnosis not present

## 2022-03-01 DIAGNOSIS — N186 End stage renal disease: Secondary | ICD-10-CM | POA: Diagnosis not present

## 2022-03-01 DIAGNOSIS — Z4932 Encounter for adequacy testing for peritoneal dialysis: Secondary | ICD-10-CM | POA: Diagnosis not present

## 2022-03-01 DIAGNOSIS — Z992 Dependence on renal dialysis: Secondary | ICD-10-CM | POA: Diagnosis not present

## 2022-03-02 DIAGNOSIS — Z992 Dependence on renal dialysis: Secondary | ICD-10-CM | POA: Diagnosis not present

## 2022-03-02 DIAGNOSIS — N186 End stage renal disease: Secondary | ICD-10-CM | POA: Diagnosis not present

## 2022-03-02 DIAGNOSIS — Z4932 Encounter for adequacy testing for peritoneal dialysis: Secondary | ICD-10-CM | POA: Diagnosis not present

## 2022-03-02 DIAGNOSIS — D631 Anemia in chronic kidney disease: Secondary | ICD-10-CM | POA: Diagnosis not present

## 2022-03-02 DIAGNOSIS — N2581 Secondary hyperparathyroidism of renal origin: Secondary | ICD-10-CM | POA: Diagnosis not present

## 2022-03-03 DIAGNOSIS — D631 Anemia in chronic kidney disease: Secondary | ICD-10-CM | POA: Diagnosis not present

## 2022-03-03 DIAGNOSIS — Z992 Dependence on renal dialysis: Secondary | ICD-10-CM | POA: Diagnosis not present

## 2022-03-03 DIAGNOSIS — N186 End stage renal disease: Secondary | ICD-10-CM | POA: Diagnosis not present

## 2022-03-03 DIAGNOSIS — Z4932 Encounter for adequacy testing for peritoneal dialysis: Secondary | ICD-10-CM | POA: Diagnosis not present

## 2022-03-03 DIAGNOSIS — N2581 Secondary hyperparathyroidism of renal origin: Secondary | ICD-10-CM | POA: Diagnosis not present

## 2022-03-04 DIAGNOSIS — N2581 Secondary hyperparathyroidism of renal origin: Secondary | ICD-10-CM | POA: Diagnosis not present

## 2022-03-04 DIAGNOSIS — Z4932 Encounter for adequacy testing for peritoneal dialysis: Secondary | ICD-10-CM | POA: Diagnosis not present

## 2022-03-04 DIAGNOSIS — D631 Anemia in chronic kidney disease: Secondary | ICD-10-CM | POA: Diagnosis not present

## 2022-03-04 DIAGNOSIS — N186 End stage renal disease: Secondary | ICD-10-CM | POA: Diagnosis not present

## 2022-03-04 DIAGNOSIS — Z992 Dependence on renal dialysis: Secondary | ICD-10-CM | POA: Diagnosis not present

## 2022-03-05 DIAGNOSIS — N186 End stage renal disease: Secondary | ICD-10-CM | POA: Diagnosis not present

## 2022-03-05 DIAGNOSIS — N2581 Secondary hyperparathyroidism of renal origin: Secondary | ICD-10-CM | POA: Diagnosis not present

## 2022-03-05 DIAGNOSIS — Z992 Dependence on renal dialysis: Secondary | ICD-10-CM | POA: Diagnosis not present

## 2022-03-05 DIAGNOSIS — Z4932 Encounter for adequacy testing for peritoneal dialysis: Secondary | ICD-10-CM | POA: Diagnosis not present

## 2022-03-05 DIAGNOSIS — D631 Anemia in chronic kidney disease: Secondary | ICD-10-CM | POA: Diagnosis not present

## 2022-03-06 DIAGNOSIS — N2581 Secondary hyperparathyroidism of renal origin: Secondary | ICD-10-CM | POA: Diagnosis not present

## 2022-03-06 DIAGNOSIS — Z4932 Encounter for adequacy testing for peritoneal dialysis: Secondary | ICD-10-CM | POA: Diagnosis not present

## 2022-03-06 DIAGNOSIS — N186 End stage renal disease: Secondary | ICD-10-CM | POA: Diagnosis not present

## 2022-03-06 DIAGNOSIS — D631 Anemia in chronic kidney disease: Secondary | ICD-10-CM | POA: Diagnosis not present

## 2022-03-06 DIAGNOSIS — Z992 Dependence on renal dialysis: Secondary | ICD-10-CM | POA: Diagnosis not present

## 2022-03-07 DIAGNOSIS — Z4932 Encounter for adequacy testing for peritoneal dialysis: Secondary | ICD-10-CM | POA: Diagnosis not present

## 2022-03-07 DIAGNOSIS — N186 End stage renal disease: Secondary | ICD-10-CM | POA: Diagnosis not present

## 2022-03-07 DIAGNOSIS — Z992 Dependence on renal dialysis: Secondary | ICD-10-CM | POA: Diagnosis not present

## 2022-03-07 DIAGNOSIS — D631 Anemia in chronic kidney disease: Secondary | ICD-10-CM | POA: Diagnosis not present

## 2022-03-07 DIAGNOSIS — N2581 Secondary hyperparathyroidism of renal origin: Secondary | ICD-10-CM | POA: Diagnosis not present

## 2022-03-08 DIAGNOSIS — D631 Anemia in chronic kidney disease: Secondary | ICD-10-CM | POA: Diagnosis not present

## 2022-03-08 DIAGNOSIS — Z4932 Encounter for adequacy testing for peritoneal dialysis: Secondary | ICD-10-CM | POA: Diagnosis not present

## 2022-03-08 DIAGNOSIS — D509 Iron deficiency anemia, unspecified: Secondary | ICD-10-CM | POA: Diagnosis not present

## 2022-03-08 DIAGNOSIS — N186 End stage renal disease: Secondary | ICD-10-CM | POA: Diagnosis not present

## 2022-03-08 DIAGNOSIS — G4733 Obstructive sleep apnea (adult) (pediatric): Secondary | ICD-10-CM | POA: Diagnosis not present

## 2022-03-08 DIAGNOSIS — N2581 Secondary hyperparathyroidism of renal origin: Secondary | ICD-10-CM | POA: Diagnosis not present

## 2022-03-08 DIAGNOSIS — Z992 Dependence on renal dialysis: Secondary | ICD-10-CM | POA: Diagnosis not present

## 2022-03-09 DIAGNOSIS — D509 Iron deficiency anemia, unspecified: Secondary | ICD-10-CM | POA: Diagnosis not present

## 2022-03-09 DIAGNOSIS — Z4932 Encounter for adequacy testing for peritoneal dialysis: Secondary | ICD-10-CM | POA: Diagnosis not present

## 2022-03-09 DIAGNOSIS — Z992 Dependence on renal dialysis: Secondary | ICD-10-CM | POA: Diagnosis not present

## 2022-03-09 DIAGNOSIS — N2581 Secondary hyperparathyroidism of renal origin: Secondary | ICD-10-CM | POA: Diagnosis not present

## 2022-03-09 DIAGNOSIS — N186 End stage renal disease: Secondary | ICD-10-CM | POA: Diagnosis not present

## 2022-03-09 DIAGNOSIS — D631 Anemia in chronic kidney disease: Secondary | ICD-10-CM | POA: Diagnosis not present

## 2022-03-10 DIAGNOSIS — Z992 Dependence on renal dialysis: Secondary | ICD-10-CM | POA: Diagnosis not present

## 2022-03-10 DIAGNOSIS — Z4932 Encounter for adequacy testing for peritoneal dialysis: Secondary | ICD-10-CM | POA: Diagnosis not present

## 2022-03-10 DIAGNOSIS — N2581 Secondary hyperparathyroidism of renal origin: Secondary | ICD-10-CM | POA: Diagnosis not present

## 2022-03-10 DIAGNOSIS — N186 End stage renal disease: Secondary | ICD-10-CM | POA: Diagnosis not present

## 2022-03-10 DIAGNOSIS — D509 Iron deficiency anemia, unspecified: Secondary | ICD-10-CM | POA: Diagnosis not present

## 2022-03-10 DIAGNOSIS — D631 Anemia in chronic kidney disease: Secondary | ICD-10-CM | POA: Diagnosis not present

## 2022-03-11 DIAGNOSIS — D509 Iron deficiency anemia, unspecified: Secondary | ICD-10-CM | POA: Diagnosis not present

## 2022-03-11 DIAGNOSIS — Z4932 Encounter for adequacy testing for peritoneal dialysis: Secondary | ICD-10-CM | POA: Diagnosis not present

## 2022-03-11 DIAGNOSIS — Z992 Dependence on renal dialysis: Secondary | ICD-10-CM | POA: Diagnosis not present

## 2022-03-11 DIAGNOSIS — D631 Anemia in chronic kidney disease: Secondary | ICD-10-CM | POA: Diagnosis not present

## 2022-03-11 DIAGNOSIS — N186 End stage renal disease: Secondary | ICD-10-CM | POA: Diagnosis not present

## 2022-03-11 DIAGNOSIS — N2581 Secondary hyperparathyroidism of renal origin: Secondary | ICD-10-CM | POA: Diagnosis not present

## 2022-03-12 DIAGNOSIS — D509 Iron deficiency anemia, unspecified: Secondary | ICD-10-CM | POA: Diagnosis not present

## 2022-03-12 DIAGNOSIS — N2581 Secondary hyperparathyroidism of renal origin: Secondary | ICD-10-CM | POA: Diagnosis not present

## 2022-03-12 DIAGNOSIS — D631 Anemia in chronic kidney disease: Secondary | ICD-10-CM | POA: Diagnosis not present

## 2022-03-12 DIAGNOSIS — N186 End stage renal disease: Secondary | ICD-10-CM | POA: Diagnosis not present

## 2022-03-12 DIAGNOSIS — Z992 Dependence on renal dialysis: Secondary | ICD-10-CM | POA: Diagnosis not present

## 2022-03-12 DIAGNOSIS — Z4932 Encounter for adequacy testing for peritoneal dialysis: Secondary | ICD-10-CM | POA: Diagnosis not present

## 2022-03-13 DIAGNOSIS — N186 End stage renal disease: Secondary | ICD-10-CM | POA: Diagnosis not present

## 2022-03-13 DIAGNOSIS — Z992 Dependence on renal dialysis: Secondary | ICD-10-CM | POA: Diagnosis not present

## 2022-03-13 DIAGNOSIS — D509 Iron deficiency anemia, unspecified: Secondary | ICD-10-CM | POA: Diagnosis not present

## 2022-03-13 DIAGNOSIS — N2581 Secondary hyperparathyroidism of renal origin: Secondary | ICD-10-CM | POA: Diagnosis not present

## 2022-03-13 DIAGNOSIS — Z4932 Encounter for adequacy testing for peritoneal dialysis: Secondary | ICD-10-CM | POA: Diagnosis not present

## 2022-03-13 DIAGNOSIS — D631 Anemia in chronic kidney disease: Secondary | ICD-10-CM | POA: Diagnosis not present

## 2022-03-14 DIAGNOSIS — N186 End stage renal disease: Secondary | ICD-10-CM | POA: Diagnosis not present

## 2022-03-14 DIAGNOSIS — Z992 Dependence on renal dialysis: Secondary | ICD-10-CM | POA: Diagnosis not present

## 2022-03-14 DIAGNOSIS — D509 Iron deficiency anemia, unspecified: Secondary | ICD-10-CM | POA: Diagnosis not present

## 2022-03-14 DIAGNOSIS — Z4932 Encounter for adequacy testing for peritoneal dialysis: Secondary | ICD-10-CM | POA: Diagnosis not present

## 2022-03-14 DIAGNOSIS — N2581 Secondary hyperparathyroidism of renal origin: Secondary | ICD-10-CM | POA: Diagnosis not present

## 2022-03-14 DIAGNOSIS — D631 Anemia in chronic kidney disease: Secondary | ICD-10-CM | POA: Diagnosis not present

## 2022-03-15 DIAGNOSIS — D631 Anemia in chronic kidney disease: Secondary | ICD-10-CM | POA: Diagnosis not present

## 2022-03-15 DIAGNOSIS — Z4932 Encounter for adequacy testing for peritoneal dialysis: Secondary | ICD-10-CM | POA: Diagnosis not present

## 2022-03-15 DIAGNOSIS — N2581 Secondary hyperparathyroidism of renal origin: Secondary | ICD-10-CM | POA: Diagnosis not present

## 2022-03-15 DIAGNOSIS — D509 Iron deficiency anemia, unspecified: Secondary | ICD-10-CM | POA: Diagnosis not present

## 2022-03-15 DIAGNOSIS — N186 End stage renal disease: Secondary | ICD-10-CM | POA: Diagnosis not present

## 2022-03-15 DIAGNOSIS — Z992 Dependence on renal dialysis: Secondary | ICD-10-CM | POA: Diagnosis not present

## 2022-03-16 DIAGNOSIS — N186 End stage renal disease: Secondary | ICD-10-CM | POA: Diagnosis not present

## 2022-03-16 DIAGNOSIS — Z4932 Encounter for adequacy testing for peritoneal dialysis: Secondary | ICD-10-CM | POA: Diagnosis not present

## 2022-03-16 DIAGNOSIS — D631 Anemia in chronic kidney disease: Secondary | ICD-10-CM | POA: Diagnosis not present

## 2022-03-16 DIAGNOSIS — D509 Iron deficiency anemia, unspecified: Secondary | ICD-10-CM | POA: Diagnosis not present

## 2022-03-16 DIAGNOSIS — N2581 Secondary hyperparathyroidism of renal origin: Secondary | ICD-10-CM | POA: Diagnosis not present

## 2022-03-16 DIAGNOSIS — Z992 Dependence on renal dialysis: Secondary | ICD-10-CM | POA: Diagnosis not present

## 2022-03-17 DIAGNOSIS — D509 Iron deficiency anemia, unspecified: Secondary | ICD-10-CM | POA: Diagnosis not present

## 2022-03-17 DIAGNOSIS — D631 Anemia in chronic kidney disease: Secondary | ICD-10-CM | POA: Diagnosis not present

## 2022-03-17 DIAGNOSIS — Z4932 Encounter for adequacy testing for peritoneal dialysis: Secondary | ICD-10-CM | POA: Diagnosis not present

## 2022-03-17 DIAGNOSIS — N2581 Secondary hyperparathyroidism of renal origin: Secondary | ICD-10-CM | POA: Diagnosis not present

## 2022-03-17 DIAGNOSIS — Z992 Dependence on renal dialysis: Secondary | ICD-10-CM | POA: Diagnosis not present

## 2022-03-17 DIAGNOSIS — N186 End stage renal disease: Secondary | ICD-10-CM | POA: Diagnosis not present

## 2022-03-18 DIAGNOSIS — D631 Anemia in chronic kidney disease: Secondary | ICD-10-CM | POA: Diagnosis not present

## 2022-03-18 DIAGNOSIS — N2581 Secondary hyperparathyroidism of renal origin: Secondary | ICD-10-CM | POA: Diagnosis not present

## 2022-03-18 DIAGNOSIS — Z992 Dependence on renal dialysis: Secondary | ICD-10-CM | POA: Diagnosis not present

## 2022-03-18 DIAGNOSIS — D509 Iron deficiency anemia, unspecified: Secondary | ICD-10-CM | POA: Diagnosis not present

## 2022-03-18 DIAGNOSIS — N186 End stage renal disease: Secondary | ICD-10-CM | POA: Diagnosis not present

## 2022-03-18 DIAGNOSIS — Z4932 Encounter for adequacy testing for peritoneal dialysis: Secondary | ICD-10-CM | POA: Diagnosis not present

## 2022-03-19 DIAGNOSIS — D509 Iron deficiency anemia, unspecified: Secondary | ICD-10-CM | POA: Diagnosis not present

## 2022-03-19 DIAGNOSIS — Z4932 Encounter for adequacy testing for peritoneal dialysis: Secondary | ICD-10-CM | POA: Diagnosis not present

## 2022-03-19 DIAGNOSIS — Z992 Dependence on renal dialysis: Secondary | ICD-10-CM | POA: Diagnosis not present

## 2022-03-19 DIAGNOSIS — N2581 Secondary hyperparathyroidism of renal origin: Secondary | ICD-10-CM | POA: Diagnosis not present

## 2022-03-19 DIAGNOSIS — D631 Anemia in chronic kidney disease: Secondary | ICD-10-CM | POA: Diagnosis not present

## 2022-03-19 DIAGNOSIS — N186 End stage renal disease: Secondary | ICD-10-CM | POA: Diagnosis not present

## 2022-03-20 DIAGNOSIS — Z992 Dependence on renal dialysis: Secondary | ICD-10-CM | POA: Diagnosis not present

## 2022-03-20 DIAGNOSIS — Z4932 Encounter for adequacy testing for peritoneal dialysis: Secondary | ICD-10-CM | POA: Diagnosis not present

## 2022-03-20 DIAGNOSIS — N186 End stage renal disease: Secondary | ICD-10-CM | POA: Diagnosis not present

## 2022-03-20 DIAGNOSIS — N2581 Secondary hyperparathyroidism of renal origin: Secondary | ICD-10-CM | POA: Diagnosis not present

## 2022-03-20 DIAGNOSIS — D509 Iron deficiency anemia, unspecified: Secondary | ICD-10-CM | POA: Diagnosis not present

## 2022-03-20 DIAGNOSIS — D631 Anemia in chronic kidney disease: Secondary | ICD-10-CM | POA: Diagnosis not present

## 2022-03-21 DIAGNOSIS — N186 End stage renal disease: Secondary | ICD-10-CM | POA: Diagnosis not present

## 2022-03-21 DIAGNOSIS — D631 Anemia in chronic kidney disease: Secondary | ICD-10-CM | POA: Diagnosis not present

## 2022-03-21 DIAGNOSIS — N2581 Secondary hyperparathyroidism of renal origin: Secondary | ICD-10-CM | POA: Diagnosis not present

## 2022-03-21 DIAGNOSIS — D509 Iron deficiency anemia, unspecified: Secondary | ICD-10-CM | POA: Diagnosis not present

## 2022-03-21 DIAGNOSIS — Z992 Dependence on renal dialysis: Secondary | ICD-10-CM | POA: Diagnosis not present

## 2022-03-21 DIAGNOSIS — Z4932 Encounter for adequacy testing for peritoneal dialysis: Secondary | ICD-10-CM | POA: Diagnosis not present

## 2022-03-22 DIAGNOSIS — D631 Anemia in chronic kidney disease: Secondary | ICD-10-CM | POA: Diagnosis not present

## 2022-03-22 DIAGNOSIS — Z992 Dependence on renal dialysis: Secondary | ICD-10-CM | POA: Diagnosis not present

## 2022-03-22 DIAGNOSIS — N2581 Secondary hyperparathyroidism of renal origin: Secondary | ICD-10-CM | POA: Diagnosis not present

## 2022-03-22 DIAGNOSIS — Z4932 Encounter for adequacy testing for peritoneal dialysis: Secondary | ICD-10-CM | POA: Diagnosis not present

## 2022-03-22 DIAGNOSIS — N186 End stage renal disease: Secondary | ICD-10-CM | POA: Diagnosis not present

## 2022-03-22 DIAGNOSIS — D509 Iron deficiency anemia, unspecified: Secondary | ICD-10-CM | POA: Diagnosis not present

## 2022-03-23 DIAGNOSIS — N2581 Secondary hyperparathyroidism of renal origin: Secondary | ICD-10-CM | POA: Diagnosis not present

## 2022-03-23 DIAGNOSIS — D631 Anemia in chronic kidney disease: Secondary | ICD-10-CM | POA: Diagnosis not present

## 2022-03-23 DIAGNOSIS — N186 End stage renal disease: Secondary | ICD-10-CM | POA: Diagnosis not present

## 2022-03-23 DIAGNOSIS — Z4932 Encounter for adequacy testing for peritoneal dialysis: Secondary | ICD-10-CM | POA: Diagnosis not present

## 2022-03-23 DIAGNOSIS — Z992 Dependence on renal dialysis: Secondary | ICD-10-CM | POA: Diagnosis not present

## 2022-03-23 DIAGNOSIS — D509 Iron deficiency anemia, unspecified: Secondary | ICD-10-CM | POA: Diagnosis not present

## 2022-03-24 DIAGNOSIS — Z4932 Encounter for adequacy testing for peritoneal dialysis: Secondary | ICD-10-CM | POA: Diagnosis not present

## 2022-03-24 DIAGNOSIS — D509 Iron deficiency anemia, unspecified: Secondary | ICD-10-CM | POA: Diagnosis not present

## 2022-03-24 DIAGNOSIS — Z992 Dependence on renal dialysis: Secondary | ICD-10-CM | POA: Diagnosis not present

## 2022-03-24 DIAGNOSIS — N2581 Secondary hyperparathyroidism of renal origin: Secondary | ICD-10-CM | POA: Diagnosis not present

## 2022-03-24 DIAGNOSIS — N186 End stage renal disease: Secondary | ICD-10-CM | POA: Diagnosis not present

## 2022-03-24 DIAGNOSIS — D631 Anemia in chronic kidney disease: Secondary | ICD-10-CM | POA: Diagnosis not present

## 2022-03-25 DIAGNOSIS — N2581 Secondary hyperparathyroidism of renal origin: Secondary | ICD-10-CM | POA: Diagnosis not present

## 2022-03-25 DIAGNOSIS — D509 Iron deficiency anemia, unspecified: Secondary | ICD-10-CM | POA: Diagnosis not present

## 2022-03-25 DIAGNOSIS — Z4932 Encounter for adequacy testing for peritoneal dialysis: Secondary | ICD-10-CM | POA: Diagnosis not present

## 2022-03-25 DIAGNOSIS — D631 Anemia in chronic kidney disease: Secondary | ICD-10-CM | POA: Diagnosis not present

## 2022-03-25 DIAGNOSIS — N186 End stage renal disease: Secondary | ICD-10-CM | POA: Diagnosis not present

## 2022-03-25 DIAGNOSIS — Z992 Dependence on renal dialysis: Secondary | ICD-10-CM | POA: Diagnosis not present

## 2022-03-26 DIAGNOSIS — Z992 Dependence on renal dialysis: Secondary | ICD-10-CM | POA: Diagnosis not present

## 2022-03-26 DIAGNOSIS — N186 End stage renal disease: Secondary | ICD-10-CM | POA: Diagnosis not present

## 2022-03-26 DIAGNOSIS — N2581 Secondary hyperparathyroidism of renal origin: Secondary | ICD-10-CM | POA: Diagnosis not present

## 2022-03-26 DIAGNOSIS — D631 Anemia in chronic kidney disease: Secondary | ICD-10-CM | POA: Diagnosis not present

## 2022-03-26 DIAGNOSIS — D509 Iron deficiency anemia, unspecified: Secondary | ICD-10-CM | POA: Diagnosis not present

## 2022-03-26 DIAGNOSIS — Z4932 Encounter for adequacy testing for peritoneal dialysis: Secondary | ICD-10-CM | POA: Diagnosis not present

## 2022-03-27 DIAGNOSIS — D631 Anemia in chronic kidney disease: Secondary | ICD-10-CM | POA: Diagnosis not present

## 2022-03-27 DIAGNOSIS — N186 End stage renal disease: Secondary | ICD-10-CM | POA: Diagnosis not present

## 2022-03-27 DIAGNOSIS — D509 Iron deficiency anemia, unspecified: Secondary | ICD-10-CM | POA: Diagnosis not present

## 2022-03-27 DIAGNOSIS — Z4932 Encounter for adequacy testing for peritoneal dialysis: Secondary | ICD-10-CM | POA: Diagnosis not present

## 2022-03-27 DIAGNOSIS — Z992 Dependence on renal dialysis: Secondary | ICD-10-CM | POA: Diagnosis not present

## 2022-03-27 DIAGNOSIS — N2581 Secondary hyperparathyroidism of renal origin: Secondary | ICD-10-CM | POA: Diagnosis not present

## 2022-03-28 DIAGNOSIS — Z992 Dependence on renal dialysis: Secondary | ICD-10-CM | POA: Diagnosis not present

## 2022-03-28 DIAGNOSIS — Z4932 Encounter for adequacy testing for peritoneal dialysis: Secondary | ICD-10-CM | POA: Diagnosis not present

## 2022-03-28 DIAGNOSIS — N2581 Secondary hyperparathyroidism of renal origin: Secondary | ICD-10-CM | POA: Diagnosis not present

## 2022-03-28 DIAGNOSIS — N186 End stage renal disease: Secondary | ICD-10-CM | POA: Diagnosis not present

## 2022-03-28 DIAGNOSIS — D509 Iron deficiency anemia, unspecified: Secondary | ICD-10-CM | POA: Diagnosis not present

## 2022-03-28 DIAGNOSIS — D631 Anemia in chronic kidney disease: Secondary | ICD-10-CM | POA: Diagnosis not present

## 2022-03-29 DIAGNOSIS — N186 End stage renal disease: Secondary | ICD-10-CM | POA: Diagnosis not present

## 2022-03-29 DIAGNOSIS — Z4932 Encounter for adequacy testing for peritoneal dialysis: Secondary | ICD-10-CM | POA: Diagnosis not present

## 2022-03-29 DIAGNOSIS — N2581 Secondary hyperparathyroidism of renal origin: Secondary | ICD-10-CM | POA: Diagnosis not present

## 2022-03-29 DIAGNOSIS — D631 Anemia in chronic kidney disease: Secondary | ICD-10-CM | POA: Diagnosis not present

## 2022-03-29 DIAGNOSIS — D509 Iron deficiency anemia, unspecified: Secondary | ICD-10-CM | POA: Diagnosis not present

## 2022-03-29 DIAGNOSIS — Z992 Dependence on renal dialysis: Secondary | ICD-10-CM | POA: Diagnosis not present

## 2022-03-30 DIAGNOSIS — N186 End stage renal disease: Secondary | ICD-10-CM | POA: Diagnosis not present

## 2022-03-30 DIAGNOSIS — Z992 Dependence on renal dialysis: Secondary | ICD-10-CM | POA: Diagnosis not present

## 2022-03-30 DIAGNOSIS — D631 Anemia in chronic kidney disease: Secondary | ICD-10-CM | POA: Diagnosis not present

## 2022-03-30 DIAGNOSIS — Z4932 Encounter for adequacy testing for peritoneal dialysis: Secondary | ICD-10-CM | POA: Diagnosis not present

## 2022-03-30 DIAGNOSIS — N2581 Secondary hyperparathyroidism of renal origin: Secondary | ICD-10-CM | POA: Diagnosis not present

## 2022-03-30 DIAGNOSIS — D509 Iron deficiency anemia, unspecified: Secondary | ICD-10-CM | POA: Diagnosis not present

## 2022-03-31 DIAGNOSIS — Z992 Dependence on renal dialysis: Secondary | ICD-10-CM | POA: Diagnosis not present

## 2022-03-31 DIAGNOSIS — N2581 Secondary hyperparathyroidism of renal origin: Secondary | ICD-10-CM | POA: Diagnosis not present

## 2022-03-31 DIAGNOSIS — D631 Anemia in chronic kidney disease: Secondary | ICD-10-CM | POA: Diagnosis not present

## 2022-03-31 DIAGNOSIS — N186 End stage renal disease: Secondary | ICD-10-CM | POA: Diagnosis not present

## 2022-03-31 DIAGNOSIS — D509 Iron deficiency anemia, unspecified: Secondary | ICD-10-CM | POA: Diagnosis not present

## 2022-03-31 DIAGNOSIS — Z4932 Encounter for adequacy testing for peritoneal dialysis: Secondary | ICD-10-CM | POA: Diagnosis not present

## 2022-04-01 DIAGNOSIS — Z992 Dependence on renal dialysis: Secondary | ICD-10-CM | POA: Diagnosis not present

## 2022-04-01 DIAGNOSIS — D631 Anemia in chronic kidney disease: Secondary | ICD-10-CM | POA: Diagnosis not present

## 2022-04-01 DIAGNOSIS — D509 Iron deficiency anemia, unspecified: Secondary | ICD-10-CM | POA: Diagnosis not present

## 2022-04-01 DIAGNOSIS — N186 End stage renal disease: Secondary | ICD-10-CM | POA: Diagnosis not present

## 2022-04-01 DIAGNOSIS — Z4932 Encounter for adequacy testing for peritoneal dialysis: Secondary | ICD-10-CM | POA: Diagnosis not present

## 2022-04-01 DIAGNOSIS — N2581 Secondary hyperparathyroidism of renal origin: Secondary | ICD-10-CM | POA: Diagnosis not present

## 2022-04-02 DIAGNOSIS — Z992 Dependence on renal dialysis: Secondary | ICD-10-CM | POA: Diagnosis not present

## 2022-04-02 DIAGNOSIS — D631 Anemia in chronic kidney disease: Secondary | ICD-10-CM | POA: Diagnosis not present

## 2022-04-02 DIAGNOSIS — Z4932 Encounter for adequacy testing for peritoneal dialysis: Secondary | ICD-10-CM | POA: Diagnosis not present

## 2022-04-02 DIAGNOSIS — D509 Iron deficiency anemia, unspecified: Secondary | ICD-10-CM | POA: Diagnosis not present

## 2022-04-02 DIAGNOSIS — N186 End stage renal disease: Secondary | ICD-10-CM | POA: Diagnosis not present

## 2022-04-02 DIAGNOSIS — N2581 Secondary hyperparathyroidism of renal origin: Secondary | ICD-10-CM | POA: Diagnosis not present

## 2022-04-03 DIAGNOSIS — Z992 Dependence on renal dialysis: Secondary | ICD-10-CM | POA: Diagnosis not present

## 2022-04-03 DIAGNOSIS — Z4932 Encounter for adequacy testing for peritoneal dialysis: Secondary | ICD-10-CM | POA: Diagnosis not present

## 2022-04-03 DIAGNOSIS — N186 End stage renal disease: Secondary | ICD-10-CM | POA: Diagnosis not present

## 2022-04-03 DIAGNOSIS — N2581 Secondary hyperparathyroidism of renal origin: Secondary | ICD-10-CM | POA: Diagnosis not present

## 2022-04-03 DIAGNOSIS — D509 Iron deficiency anemia, unspecified: Secondary | ICD-10-CM | POA: Diagnosis not present

## 2022-04-03 DIAGNOSIS — D631 Anemia in chronic kidney disease: Secondary | ICD-10-CM | POA: Diagnosis not present

## 2022-04-04 DIAGNOSIS — D631 Anemia in chronic kidney disease: Secondary | ICD-10-CM | POA: Diagnosis not present

## 2022-04-04 DIAGNOSIS — N2581 Secondary hyperparathyroidism of renal origin: Secondary | ICD-10-CM | POA: Diagnosis not present

## 2022-04-04 DIAGNOSIS — N186 End stage renal disease: Secondary | ICD-10-CM | POA: Diagnosis not present

## 2022-04-04 DIAGNOSIS — D509 Iron deficiency anemia, unspecified: Secondary | ICD-10-CM | POA: Diagnosis not present

## 2022-04-04 DIAGNOSIS — Z4932 Encounter for adequacy testing for peritoneal dialysis: Secondary | ICD-10-CM | POA: Diagnosis not present

## 2022-04-04 DIAGNOSIS — Z992 Dependence on renal dialysis: Secondary | ICD-10-CM | POA: Diagnosis not present

## 2022-04-05 DIAGNOSIS — D631 Anemia in chronic kidney disease: Secondary | ICD-10-CM | POA: Diagnosis not present

## 2022-04-05 DIAGNOSIS — N2581 Secondary hyperparathyroidism of renal origin: Secondary | ICD-10-CM | POA: Diagnosis not present

## 2022-04-05 DIAGNOSIS — Z4932 Encounter for adequacy testing for peritoneal dialysis: Secondary | ICD-10-CM | POA: Diagnosis not present

## 2022-04-05 DIAGNOSIS — Z992 Dependence on renal dialysis: Secondary | ICD-10-CM | POA: Diagnosis not present

## 2022-04-05 DIAGNOSIS — D509 Iron deficiency anemia, unspecified: Secondary | ICD-10-CM | POA: Diagnosis not present

## 2022-04-05 DIAGNOSIS — N186 End stage renal disease: Secondary | ICD-10-CM | POA: Diagnosis not present

## 2022-04-06 DIAGNOSIS — Z992 Dependence on renal dialysis: Secondary | ICD-10-CM | POA: Diagnosis not present

## 2022-04-06 DIAGNOSIS — N2581 Secondary hyperparathyroidism of renal origin: Secondary | ICD-10-CM | POA: Diagnosis not present

## 2022-04-06 DIAGNOSIS — Z4932 Encounter for adequacy testing for peritoneal dialysis: Secondary | ICD-10-CM | POA: Diagnosis not present

## 2022-04-06 DIAGNOSIS — N186 End stage renal disease: Secondary | ICD-10-CM | POA: Diagnosis not present

## 2022-04-06 DIAGNOSIS — D631 Anemia in chronic kidney disease: Secondary | ICD-10-CM | POA: Diagnosis not present

## 2022-04-06 DIAGNOSIS — D509 Iron deficiency anemia, unspecified: Secondary | ICD-10-CM | POA: Diagnosis not present

## 2022-04-07 DIAGNOSIS — D509 Iron deficiency anemia, unspecified: Secondary | ICD-10-CM | POA: Diagnosis not present

## 2022-04-07 DIAGNOSIS — D631 Anemia in chronic kidney disease: Secondary | ICD-10-CM | POA: Diagnosis not present

## 2022-04-07 DIAGNOSIS — N186 End stage renal disease: Secondary | ICD-10-CM | POA: Diagnosis not present

## 2022-04-07 DIAGNOSIS — N2581 Secondary hyperparathyroidism of renal origin: Secondary | ICD-10-CM | POA: Diagnosis not present

## 2022-04-07 DIAGNOSIS — Z992 Dependence on renal dialysis: Secondary | ICD-10-CM | POA: Diagnosis not present

## 2022-04-07 DIAGNOSIS — Z4932 Encounter for adequacy testing for peritoneal dialysis: Secondary | ICD-10-CM | POA: Diagnosis not present

## 2022-04-08 DIAGNOSIS — N2581 Secondary hyperparathyroidism of renal origin: Secondary | ICD-10-CM | POA: Diagnosis not present

## 2022-04-08 DIAGNOSIS — D631 Anemia in chronic kidney disease: Secondary | ICD-10-CM | POA: Diagnosis not present

## 2022-04-08 DIAGNOSIS — N186 End stage renal disease: Secondary | ICD-10-CM | POA: Diagnosis not present

## 2022-04-08 DIAGNOSIS — Z992 Dependence on renal dialysis: Secondary | ICD-10-CM | POA: Diagnosis not present

## 2022-04-09 DIAGNOSIS — D631 Anemia in chronic kidney disease: Secondary | ICD-10-CM | POA: Diagnosis not present

## 2022-04-09 DIAGNOSIS — Z992 Dependence on renal dialysis: Secondary | ICD-10-CM | POA: Diagnosis not present

## 2022-04-09 DIAGNOSIS — N186 End stage renal disease: Secondary | ICD-10-CM | POA: Diagnosis not present

## 2022-04-09 DIAGNOSIS — N2581 Secondary hyperparathyroidism of renal origin: Secondary | ICD-10-CM | POA: Diagnosis not present

## 2022-04-10 DIAGNOSIS — N2581 Secondary hyperparathyroidism of renal origin: Secondary | ICD-10-CM | POA: Diagnosis not present

## 2022-04-10 DIAGNOSIS — N186 End stage renal disease: Secondary | ICD-10-CM | POA: Diagnosis not present

## 2022-04-10 DIAGNOSIS — Z992 Dependence on renal dialysis: Secondary | ICD-10-CM | POA: Diagnosis not present

## 2022-04-10 DIAGNOSIS — D631 Anemia in chronic kidney disease: Secondary | ICD-10-CM | POA: Diagnosis not present

## 2022-04-11 DIAGNOSIS — N186 End stage renal disease: Secondary | ICD-10-CM | POA: Diagnosis not present

## 2022-04-11 DIAGNOSIS — N2581 Secondary hyperparathyroidism of renal origin: Secondary | ICD-10-CM | POA: Diagnosis not present

## 2022-04-11 DIAGNOSIS — D631 Anemia in chronic kidney disease: Secondary | ICD-10-CM | POA: Diagnosis not present

## 2022-04-11 DIAGNOSIS — Z992 Dependence on renal dialysis: Secondary | ICD-10-CM | POA: Diagnosis not present

## 2022-04-12 DIAGNOSIS — Z992 Dependence on renal dialysis: Secondary | ICD-10-CM | POA: Diagnosis not present

## 2022-04-12 DIAGNOSIS — N2581 Secondary hyperparathyroidism of renal origin: Secondary | ICD-10-CM | POA: Diagnosis not present

## 2022-04-12 DIAGNOSIS — D631 Anemia in chronic kidney disease: Secondary | ICD-10-CM | POA: Diagnosis not present

## 2022-04-12 DIAGNOSIS — N186 End stage renal disease: Secondary | ICD-10-CM | POA: Diagnosis not present

## 2022-04-13 DIAGNOSIS — N186 End stage renal disease: Secondary | ICD-10-CM | POA: Diagnosis not present

## 2022-04-13 DIAGNOSIS — N2581 Secondary hyperparathyroidism of renal origin: Secondary | ICD-10-CM | POA: Diagnosis not present

## 2022-04-13 DIAGNOSIS — D631 Anemia in chronic kidney disease: Secondary | ICD-10-CM | POA: Diagnosis not present

## 2022-04-13 DIAGNOSIS — Z992 Dependence on renal dialysis: Secondary | ICD-10-CM | POA: Diagnosis not present

## 2022-04-14 DIAGNOSIS — D631 Anemia in chronic kidney disease: Secondary | ICD-10-CM | POA: Diagnosis not present

## 2022-04-14 DIAGNOSIS — N2581 Secondary hyperparathyroidism of renal origin: Secondary | ICD-10-CM | POA: Diagnosis not present

## 2022-04-14 DIAGNOSIS — N186 End stage renal disease: Secondary | ICD-10-CM | POA: Diagnosis not present

## 2022-04-14 DIAGNOSIS — Z992 Dependence on renal dialysis: Secondary | ICD-10-CM | POA: Diagnosis not present

## 2022-04-15 DIAGNOSIS — N2581 Secondary hyperparathyroidism of renal origin: Secondary | ICD-10-CM | POA: Diagnosis not present

## 2022-04-15 DIAGNOSIS — Z992 Dependence on renal dialysis: Secondary | ICD-10-CM | POA: Diagnosis not present

## 2022-04-15 DIAGNOSIS — D631 Anemia in chronic kidney disease: Secondary | ICD-10-CM | POA: Diagnosis not present

## 2022-04-15 DIAGNOSIS — N186 End stage renal disease: Secondary | ICD-10-CM | POA: Diagnosis not present

## 2022-04-16 DIAGNOSIS — D631 Anemia in chronic kidney disease: Secondary | ICD-10-CM | POA: Diagnosis not present

## 2022-04-16 DIAGNOSIS — N186 End stage renal disease: Secondary | ICD-10-CM | POA: Diagnosis not present

## 2022-04-16 DIAGNOSIS — Z992 Dependence on renal dialysis: Secondary | ICD-10-CM | POA: Diagnosis not present

## 2022-04-16 DIAGNOSIS — N2581 Secondary hyperparathyroidism of renal origin: Secondary | ICD-10-CM | POA: Diagnosis not present

## 2022-04-17 DIAGNOSIS — Z992 Dependence on renal dialysis: Secondary | ICD-10-CM | POA: Diagnosis not present

## 2022-04-17 DIAGNOSIS — N2581 Secondary hyperparathyroidism of renal origin: Secondary | ICD-10-CM | POA: Diagnosis not present

## 2022-04-17 DIAGNOSIS — D631 Anemia in chronic kidney disease: Secondary | ICD-10-CM | POA: Diagnosis not present

## 2022-04-17 DIAGNOSIS — N186 End stage renal disease: Secondary | ICD-10-CM | POA: Diagnosis not present

## 2022-04-18 DIAGNOSIS — N2581 Secondary hyperparathyroidism of renal origin: Secondary | ICD-10-CM | POA: Diagnosis not present

## 2022-04-18 DIAGNOSIS — D631 Anemia in chronic kidney disease: Secondary | ICD-10-CM | POA: Diagnosis not present

## 2022-04-18 DIAGNOSIS — Z992 Dependence on renal dialysis: Secondary | ICD-10-CM | POA: Diagnosis not present

## 2022-04-18 DIAGNOSIS — N186 End stage renal disease: Secondary | ICD-10-CM | POA: Diagnosis not present

## 2022-04-19 DIAGNOSIS — D631 Anemia in chronic kidney disease: Secondary | ICD-10-CM | POA: Diagnosis not present

## 2022-04-19 DIAGNOSIS — N186 End stage renal disease: Secondary | ICD-10-CM | POA: Diagnosis not present

## 2022-04-19 DIAGNOSIS — N2581 Secondary hyperparathyroidism of renal origin: Secondary | ICD-10-CM | POA: Diagnosis not present

## 2022-04-19 DIAGNOSIS — Z992 Dependence on renal dialysis: Secondary | ICD-10-CM | POA: Diagnosis not present

## 2022-04-20 DIAGNOSIS — D631 Anemia in chronic kidney disease: Secondary | ICD-10-CM | POA: Diagnosis not present

## 2022-04-20 DIAGNOSIS — N2581 Secondary hyperparathyroidism of renal origin: Secondary | ICD-10-CM | POA: Diagnosis not present

## 2022-04-20 DIAGNOSIS — N186 End stage renal disease: Secondary | ICD-10-CM | POA: Diagnosis not present

## 2022-04-20 DIAGNOSIS — Z992 Dependence on renal dialysis: Secondary | ICD-10-CM | POA: Diagnosis not present

## 2022-04-21 DIAGNOSIS — D631 Anemia in chronic kidney disease: Secondary | ICD-10-CM | POA: Diagnosis not present

## 2022-04-21 DIAGNOSIS — N2581 Secondary hyperparathyroidism of renal origin: Secondary | ICD-10-CM | POA: Diagnosis not present

## 2022-04-21 DIAGNOSIS — Z992 Dependence on renal dialysis: Secondary | ICD-10-CM | POA: Diagnosis not present

## 2022-04-21 DIAGNOSIS — N186 End stage renal disease: Secondary | ICD-10-CM | POA: Diagnosis not present

## 2022-04-22 DIAGNOSIS — N186 End stage renal disease: Secondary | ICD-10-CM | POA: Diagnosis not present

## 2022-04-22 DIAGNOSIS — Z992 Dependence on renal dialysis: Secondary | ICD-10-CM | POA: Diagnosis not present

## 2022-04-22 DIAGNOSIS — N2581 Secondary hyperparathyroidism of renal origin: Secondary | ICD-10-CM | POA: Diagnosis not present

## 2022-04-22 DIAGNOSIS — D631 Anemia in chronic kidney disease: Secondary | ICD-10-CM | POA: Diagnosis not present

## 2022-04-23 DIAGNOSIS — D631 Anemia in chronic kidney disease: Secondary | ICD-10-CM | POA: Diagnosis not present

## 2022-04-23 DIAGNOSIS — Z992 Dependence on renal dialysis: Secondary | ICD-10-CM | POA: Diagnosis not present

## 2022-04-23 DIAGNOSIS — N186 End stage renal disease: Secondary | ICD-10-CM | POA: Diagnosis not present

## 2022-04-23 DIAGNOSIS — N2581 Secondary hyperparathyroidism of renal origin: Secondary | ICD-10-CM | POA: Diagnosis not present

## 2022-04-24 DIAGNOSIS — Z992 Dependence on renal dialysis: Secondary | ICD-10-CM | POA: Diagnosis not present

## 2022-04-24 DIAGNOSIS — N186 End stage renal disease: Secondary | ICD-10-CM | POA: Diagnosis not present

## 2022-04-24 DIAGNOSIS — D631 Anemia in chronic kidney disease: Secondary | ICD-10-CM | POA: Diagnosis not present

## 2022-04-24 DIAGNOSIS — N2581 Secondary hyperparathyroidism of renal origin: Secondary | ICD-10-CM | POA: Diagnosis not present

## 2022-04-25 DIAGNOSIS — N186 End stage renal disease: Secondary | ICD-10-CM | POA: Diagnosis not present

## 2022-04-25 DIAGNOSIS — Z992 Dependence on renal dialysis: Secondary | ICD-10-CM | POA: Diagnosis not present

## 2022-04-25 DIAGNOSIS — D631 Anemia in chronic kidney disease: Secondary | ICD-10-CM | POA: Diagnosis not present

## 2022-04-25 DIAGNOSIS — N2581 Secondary hyperparathyroidism of renal origin: Secondary | ICD-10-CM | POA: Diagnosis not present

## 2022-04-26 DIAGNOSIS — D631 Anemia in chronic kidney disease: Secondary | ICD-10-CM | POA: Diagnosis not present

## 2022-04-26 DIAGNOSIS — N186 End stage renal disease: Secondary | ICD-10-CM | POA: Diagnosis not present

## 2022-04-26 DIAGNOSIS — Z992 Dependence on renal dialysis: Secondary | ICD-10-CM | POA: Diagnosis not present

## 2022-04-26 DIAGNOSIS — N2581 Secondary hyperparathyroidism of renal origin: Secondary | ICD-10-CM | POA: Diagnosis not present

## 2022-04-27 DIAGNOSIS — Z992 Dependence on renal dialysis: Secondary | ICD-10-CM | POA: Diagnosis not present

## 2022-04-27 DIAGNOSIS — D631 Anemia in chronic kidney disease: Secondary | ICD-10-CM | POA: Diagnosis not present

## 2022-04-27 DIAGNOSIS — N186 End stage renal disease: Secondary | ICD-10-CM | POA: Diagnosis not present

## 2022-04-27 DIAGNOSIS — N2581 Secondary hyperparathyroidism of renal origin: Secondary | ICD-10-CM | POA: Diagnosis not present

## 2022-04-28 DIAGNOSIS — Z992 Dependence on renal dialysis: Secondary | ICD-10-CM | POA: Diagnosis not present

## 2022-04-28 DIAGNOSIS — N186 End stage renal disease: Secondary | ICD-10-CM | POA: Diagnosis not present

## 2022-04-28 DIAGNOSIS — N2581 Secondary hyperparathyroidism of renal origin: Secondary | ICD-10-CM | POA: Diagnosis not present

## 2022-04-28 DIAGNOSIS — D631 Anemia in chronic kidney disease: Secondary | ICD-10-CM | POA: Diagnosis not present

## 2022-04-29 DIAGNOSIS — Z992 Dependence on renal dialysis: Secondary | ICD-10-CM | POA: Diagnosis not present

## 2022-04-29 DIAGNOSIS — N2581 Secondary hyperparathyroidism of renal origin: Secondary | ICD-10-CM | POA: Diagnosis not present

## 2022-04-29 DIAGNOSIS — N186 End stage renal disease: Secondary | ICD-10-CM | POA: Diagnosis not present

## 2022-04-29 DIAGNOSIS — D631 Anemia in chronic kidney disease: Secondary | ICD-10-CM | POA: Diagnosis not present

## 2022-04-30 DIAGNOSIS — D631 Anemia in chronic kidney disease: Secondary | ICD-10-CM | POA: Diagnosis not present

## 2022-04-30 DIAGNOSIS — N2581 Secondary hyperparathyroidism of renal origin: Secondary | ICD-10-CM | POA: Diagnosis not present

## 2022-04-30 DIAGNOSIS — N186 End stage renal disease: Secondary | ICD-10-CM | POA: Diagnosis not present

## 2022-04-30 DIAGNOSIS — Z992 Dependence on renal dialysis: Secondary | ICD-10-CM | POA: Diagnosis not present

## 2022-05-01 DIAGNOSIS — Z992 Dependence on renal dialysis: Secondary | ICD-10-CM | POA: Diagnosis not present

## 2022-05-01 DIAGNOSIS — N186 End stage renal disease: Secondary | ICD-10-CM | POA: Diagnosis not present

## 2022-05-01 DIAGNOSIS — D631 Anemia in chronic kidney disease: Secondary | ICD-10-CM | POA: Diagnosis not present

## 2022-05-01 DIAGNOSIS — N2581 Secondary hyperparathyroidism of renal origin: Secondary | ICD-10-CM | POA: Diagnosis not present

## 2022-05-02 DIAGNOSIS — Z992 Dependence on renal dialysis: Secondary | ICD-10-CM | POA: Diagnosis not present

## 2022-05-02 DIAGNOSIS — N186 End stage renal disease: Secondary | ICD-10-CM | POA: Diagnosis not present

## 2022-05-02 DIAGNOSIS — D631 Anemia in chronic kidney disease: Secondary | ICD-10-CM | POA: Diagnosis not present

## 2022-05-02 DIAGNOSIS — N2581 Secondary hyperparathyroidism of renal origin: Secondary | ICD-10-CM | POA: Diagnosis not present

## 2022-05-03 DIAGNOSIS — N186 End stage renal disease: Secondary | ICD-10-CM | POA: Diagnosis not present

## 2022-05-03 DIAGNOSIS — N2581 Secondary hyperparathyroidism of renal origin: Secondary | ICD-10-CM | POA: Diagnosis not present

## 2022-05-03 DIAGNOSIS — D631 Anemia in chronic kidney disease: Secondary | ICD-10-CM | POA: Diagnosis not present

## 2022-05-03 DIAGNOSIS — Z992 Dependence on renal dialysis: Secondary | ICD-10-CM | POA: Diagnosis not present

## 2022-05-04 DIAGNOSIS — D631 Anemia in chronic kidney disease: Secondary | ICD-10-CM | POA: Diagnosis not present

## 2022-05-04 DIAGNOSIS — Z992 Dependence on renal dialysis: Secondary | ICD-10-CM | POA: Diagnosis not present

## 2022-05-04 DIAGNOSIS — N186 End stage renal disease: Secondary | ICD-10-CM | POA: Diagnosis not present

## 2022-05-04 DIAGNOSIS — N2581 Secondary hyperparathyroidism of renal origin: Secondary | ICD-10-CM | POA: Diagnosis not present

## 2022-05-05 DIAGNOSIS — Z992 Dependence on renal dialysis: Secondary | ICD-10-CM | POA: Diagnosis not present

## 2022-05-05 DIAGNOSIS — N2581 Secondary hyperparathyroidism of renal origin: Secondary | ICD-10-CM | POA: Diagnosis not present

## 2022-05-05 DIAGNOSIS — N186 End stage renal disease: Secondary | ICD-10-CM | POA: Diagnosis not present

## 2022-05-05 DIAGNOSIS — D631 Anemia in chronic kidney disease: Secondary | ICD-10-CM | POA: Diagnosis not present

## 2022-05-06 DIAGNOSIS — N2581 Secondary hyperparathyroidism of renal origin: Secondary | ICD-10-CM | POA: Diagnosis not present

## 2022-05-06 DIAGNOSIS — Z992 Dependence on renal dialysis: Secondary | ICD-10-CM | POA: Diagnosis not present

## 2022-05-06 DIAGNOSIS — D631 Anemia in chronic kidney disease: Secondary | ICD-10-CM | POA: Diagnosis not present

## 2022-05-06 DIAGNOSIS — N186 End stage renal disease: Secondary | ICD-10-CM | POA: Diagnosis not present

## 2022-05-07 DIAGNOSIS — N2581 Secondary hyperparathyroidism of renal origin: Secondary | ICD-10-CM | POA: Diagnosis not present

## 2022-05-07 DIAGNOSIS — N186 End stage renal disease: Secondary | ICD-10-CM | POA: Diagnosis not present

## 2022-05-07 DIAGNOSIS — D631 Anemia in chronic kidney disease: Secondary | ICD-10-CM | POA: Diagnosis not present

## 2022-05-07 DIAGNOSIS — Z992 Dependence on renal dialysis: Secondary | ICD-10-CM | POA: Diagnosis not present

## 2022-05-08 DIAGNOSIS — N2581 Secondary hyperparathyroidism of renal origin: Secondary | ICD-10-CM | POA: Diagnosis not present

## 2022-05-08 DIAGNOSIS — N186 End stage renal disease: Secondary | ICD-10-CM | POA: Diagnosis not present

## 2022-05-08 DIAGNOSIS — D509 Iron deficiency anemia, unspecified: Secondary | ICD-10-CM | POA: Diagnosis not present

## 2022-05-08 DIAGNOSIS — D631 Anemia in chronic kidney disease: Secondary | ICD-10-CM | POA: Diagnosis not present

## 2022-05-08 DIAGNOSIS — Z992 Dependence on renal dialysis: Secondary | ICD-10-CM | POA: Diagnosis not present

## 2022-05-09 DIAGNOSIS — D509 Iron deficiency anemia, unspecified: Secondary | ICD-10-CM | POA: Diagnosis not present

## 2022-05-09 DIAGNOSIS — N2581 Secondary hyperparathyroidism of renal origin: Secondary | ICD-10-CM | POA: Diagnosis not present

## 2022-05-09 DIAGNOSIS — Z992 Dependence on renal dialysis: Secondary | ICD-10-CM | POA: Diagnosis not present

## 2022-05-09 DIAGNOSIS — D631 Anemia in chronic kidney disease: Secondary | ICD-10-CM | POA: Diagnosis not present

## 2022-05-09 DIAGNOSIS — N186 End stage renal disease: Secondary | ICD-10-CM | POA: Diagnosis not present

## 2022-05-10 DIAGNOSIS — N2581 Secondary hyperparathyroidism of renal origin: Secondary | ICD-10-CM | POA: Diagnosis not present

## 2022-05-10 DIAGNOSIS — N186 End stage renal disease: Secondary | ICD-10-CM | POA: Diagnosis not present

## 2022-05-10 DIAGNOSIS — D631 Anemia in chronic kidney disease: Secondary | ICD-10-CM | POA: Diagnosis not present

## 2022-05-10 DIAGNOSIS — D509 Iron deficiency anemia, unspecified: Secondary | ICD-10-CM | POA: Diagnosis not present

## 2022-05-10 DIAGNOSIS — Z992 Dependence on renal dialysis: Secondary | ICD-10-CM | POA: Diagnosis not present

## 2022-05-11 DIAGNOSIS — N2581 Secondary hyperparathyroidism of renal origin: Secondary | ICD-10-CM | POA: Diagnosis not present

## 2022-05-11 DIAGNOSIS — D631 Anemia in chronic kidney disease: Secondary | ICD-10-CM | POA: Diagnosis not present

## 2022-05-11 DIAGNOSIS — D509 Iron deficiency anemia, unspecified: Secondary | ICD-10-CM | POA: Diagnosis not present

## 2022-05-11 DIAGNOSIS — Z992 Dependence on renal dialysis: Secondary | ICD-10-CM | POA: Diagnosis not present

## 2022-05-11 DIAGNOSIS — N186 End stage renal disease: Secondary | ICD-10-CM | POA: Diagnosis not present

## 2022-05-12 DIAGNOSIS — N186 End stage renal disease: Secondary | ICD-10-CM | POA: Diagnosis not present

## 2022-05-12 DIAGNOSIS — N2581 Secondary hyperparathyroidism of renal origin: Secondary | ICD-10-CM | POA: Diagnosis not present

## 2022-05-12 DIAGNOSIS — D509 Iron deficiency anemia, unspecified: Secondary | ICD-10-CM | POA: Diagnosis not present

## 2022-05-12 DIAGNOSIS — Z992 Dependence on renal dialysis: Secondary | ICD-10-CM | POA: Diagnosis not present

## 2022-05-12 DIAGNOSIS — D631 Anemia in chronic kidney disease: Secondary | ICD-10-CM | POA: Diagnosis not present

## 2022-05-13 DIAGNOSIS — Z992 Dependence on renal dialysis: Secondary | ICD-10-CM | POA: Diagnosis not present

## 2022-05-13 DIAGNOSIS — D631 Anemia in chronic kidney disease: Secondary | ICD-10-CM | POA: Diagnosis not present

## 2022-05-13 DIAGNOSIS — D509 Iron deficiency anemia, unspecified: Secondary | ICD-10-CM | POA: Diagnosis not present

## 2022-05-13 DIAGNOSIS — N186 End stage renal disease: Secondary | ICD-10-CM | POA: Diagnosis not present

## 2022-05-13 DIAGNOSIS — N2581 Secondary hyperparathyroidism of renal origin: Secondary | ICD-10-CM | POA: Diagnosis not present

## 2022-05-14 DIAGNOSIS — N2581 Secondary hyperparathyroidism of renal origin: Secondary | ICD-10-CM | POA: Diagnosis not present

## 2022-05-14 DIAGNOSIS — D509 Iron deficiency anemia, unspecified: Secondary | ICD-10-CM | POA: Diagnosis not present

## 2022-05-14 DIAGNOSIS — D631 Anemia in chronic kidney disease: Secondary | ICD-10-CM | POA: Diagnosis not present

## 2022-05-14 DIAGNOSIS — Z992 Dependence on renal dialysis: Secondary | ICD-10-CM | POA: Diagnosis not present

## 2022-05-14 DIAGNOSIS — N186 End stage renal disease: Secondary | ICD-10-CM | POA: Diagnosis not present

## 2022-05-15 DIAGNOSIS — Z992 Dependence on renal dialysis: Secondary | ICD-10-CM | POA: Diagnosis not present

## 2022-05-15 DIAGNOSIS — D631 Anemia in chronic kidney disease: Secondary | ICD-10-CM | POA: Diagnosis not present

## 2022-05-15 DIAGNOSIS — N186 End stage renal disease: Secondary | ICD-10-CM | POA: Diagnosis not present

## 2022-05-15 DIAGNOSIS — D509 Iron deficiency anemia, unspecified: Secondary | ICD-10-CM | POA: Diagnosis not present

## 2022-05-15 DIAGNOSIS — N2581 Secondary hyperparathyroidism of renal origin: Secondary | ICD-10-CM | POA: Diagnosis not present

## 2022-05-16 DIAGNOSIS — D509 Iron deficiency anemia, unspecified: Secondary | ICD-10-CM | POA: Diagnosis not present

## 2022-05-16 DIAGNOSIS — N2581 Secondary hyperparathyroidism of renal origin: Secondary | ICD-10-CM | POA: Diagnosis not present

## 2022-05-16 DIAGNOSIS — D631 Anemia in chronic kidney disease: Secondary | ICD-10-CM | POA: Diagnosis not present

## 2022-05-16 DIAGNOSIS — N186 End stage renal disease: Secondary | ICD-10-CM | POA: Diagnosis not present

## 2022-05-16 DIAGNOSIS — Z992 Dependence on renal dialysis: Secondary | ICD-10-CM | POA: Diagnosis not present

## 2022-05-17 DIAGNOSIS — D631 Anemia in chronic kidney disease: Secondary | ICD-10-CM | POA: Diagnosis not present

## 2022-05-17 DIAGNOSIS — N186 End stage renal disease: Secondary | ICD-10-CM | POA: Diagnosis not present

## 2022-05-17 DIAGNOSIS — D509 Iron deficiency anemia, unspecified: Secondary | ICD-10-CM | POA: Diagnosis not present

## 2022-05-17 DIAGNOSIS — N2581 Secondary hyperparathyroidism of renal origin: Secondary | ICD-10-CM | POA: Diagnosis not present

## 2022-05-17 DIAGNOSIS — Z992 Dependence on renal dialysis: Secondary | ICD-10-CM | POA: Diagnosis not present

## 2022-05-18 DIAGNOSIS — N186 End stage renal disease: Secondary | ICD-10-CM | POA: Diagnosis not present

## 2022-05-18 DIAGNOSIS — D631 Anemia in chronic kidney disease: Secondary | ICD-10-CM | POA: Diagnosis not present

## 2022-05-18 DIAGNOSIS — N2581 Secondary hyperparathyroidism of renal origin: Secondary | ICD-10-CM | POA: Diagnosis not present

## 2022-05-18 DIAGNOSIS — Z992 Dependence on renal dialysis: Secondary | ICD-10-CM | POA: Diagnosis not present

## 2022-05-18 DIAGNOSIS — D509 Iron deficiency anemia, unspecified: Secondary | ICD-10-CM | POA: Diagnosis not present

## 2022-05-19 DIAGNOSIS — D631 Anemia in chronic kidney disease: Secondary | ICD-10-CM | POA: Diagnosis not present

## 2022-05-19 DIAGNOSIS — N186 End stage renal disease: Secondary | ICD-10-CM | POA: Diagnosis not present

## 2022-05-19 DIAGNOSIS — D509 Iron deficiency anemia, unspecified: Secondary | ICD-10-CM | POA: Diagnosis not present

## 2022-05-19 DIAGNOSIS — N2581 Secondary hyperparathyroidism of renal origin: Secondary | ICD-10-CM | POA: Diagnosis not present

## 2022-05-19 DIAGNOSIS — Z992 Dependence on renal dialysis: Secondary | ICD-10-CM | POA: Diagnosis not present

## 2022-05-20 DIAGNOSIS — D509 Iron deficiency anemia, unspecified: Secondary | ICD-10-CM | POA: Diagnosis not present

## 2022-05-20 DIAGNOSIS — N186 End stage renal disease: Secondary | ICD-10-CM | POA: Diagnosis not present

## 2022-05-20 DIAGNOSIS — D631 Anemia in chronic kidney disease: Secondary | ICD-10-CM | POA: Diagnosis not present

## 2022-05-20 DIAGNOSIS — Z992 Dependence on renal dialysis: Secondary | ICD-10-CM | POA: Diagnosis not present

## 2022-05-20 DIAGNOSIS — N2581 Secondary hyperparathyroidism of renal origin: Secondary | ICD-10-CM | POA: Diagnosis not present

## 2022-05-21 DIAGNOSIS — Z992 Dependence on renal dialysis: Secondary | ICD-10-CM | POA: Diagnosis not present

## 2022-05-21 DIAGNOSIS — N2581 Secondary hyperparathyroidism of renal origin: Secondary | ICD-10-CM | POA: Diagnosis not present

## 2022-05-21 DIAGNOSIS — N186 End stage renal disease: Secondary | ICD-10-CM | POA: Diagnosis not present

## 2022-05-21 DIAGNOSIS — D509 Iron deficiency anemia, unspecified: Secondary | ICD-10-CM | POA: Diagnosis not present

## 2022-05-21 DIAGNOSIS — D631 Anemia in chronic kidney disease: Secondary | ICD-10-CM | POA: Diagnosis not present

## 2022-05-22 DIAGNOSIS — D631 Anemia in chronic kidney disease: Secondary | ICD-10-CM | POA: Diagnosis not present

## 2022-05-22 DIAGNOSIS — Z992 Dependence on renal dialysis: Secondary | ICD-10-CM | POA: Diagnosis not present

## 2022-05-22 DIAGNOSIS — N2581 Secondary hyperparathyroidism of renal origin: Secondary | ICD-10-CM | POA: Diagnosis not present

## 2022-05-22 DIAGNOSIS — N186 End stage renal disease: Secondary | ICD-10-CM | POA: Diagnosis not present

## 2022-05-22 DIAGNOSIS — D509 Iron deficiency anemia, unspecified: Secondary | ICD-10-CM | POA: Diagnosis not present

## 2022-05-23 DIAGNOSIS — N2581 Secondary hyperparathyroidism of renal origin: Secondary | ICD-10-CM | POA: Diagnosis not present

## 2022-05-23 DIAGNOSIS — N186 End stage renal disease: Secondary | ICD-10-CM | POA: Diagnosis not present

## 2022-05-23 DIAGNOSIS — Z992 Dependence on renal dialysis: Secondary | ICD-10-CM | POA: Diagnosis not present

## 2022-05-23 DIAGNOSIS — D631 Anemia in chronic kidney disease: Secondary | ICD-10-CM | POA: Diagnosis not present

## 2022-05-23 DIAGNOSIS — D509 Iron deficiency anemia, unspecified: Secondary | ICD-10-CM | POA: Diagnosis not present

## 2022-05-24 DIAGNOSIS — D509 Iron deficiency anemia, unspecified: Secondary | ICD-10-CM | POA: Diagnosis not present

## 2022-05-24 DIAGNOSIS — N186 End stage renal disease: Secondary | ICD-10-CM | POA: Diagnosis not present

## 2022-05-24 DIAGNOSIS — Z992 Dependence on renal dialysis: Secondary | ICD-10-CM | POA: Diagnosis not present

## 2022-05-24 DIAGNOSIS — D631 Anemia in chronic kidney disease: Secondary | ICD-10-CM | POA: Diagnosis not present

## 2022-05-24 DIAGNOSIS — N2581 Secondary hyperparathyroidism of renal origin: Secondary | ICD-10-CM | POA: Diagnosis not present

## 2022-05-25 DIAGNOSIS — N186 End stage renal disease: Secondary | ICD-10-CM | POA: Diagnosis not present

## 2022-05-25 DIAGNOSIS — D509 Iron deficiency anemia, unspecified: Secondary | ICD-10-CM | POA: Diagnosis not present

## 2022-05-25 DIAGNOSIS — D631 Anemia in chronic kidney disease: Secondary | ICD-10-CM | POA: Diagnosis not present

## 2022-05-25 DIAGNOSIS — N2581 Secondary hyperparathyroidism of renal origin: Secondary | ICD-10-CM | POA: Diagnosis not present

## 2022-05-25 DIAGNOSIS — Z992 Dependence on renal dialysis: Secondary | ICD-10-CM | POA: Diagnosis not present

## 2022-05-26 DIAGNOSIS — Z992 Dependence on renal dialysis: Secondary | ICD-10-CM | POA: Diagnosis not present

## 2022-05-26 DIAGNOSIS — D631 Anemia in chronic kidney disease: Secondary | ICD-10-CM | POA: Diagnosis not present

## 2022-05-26 DIAGNOSIS — N2581 Secondary hyperparathyroidism of renal origin: Secondary | ICD-10-CM | POA: Diagnosis not present

## 2022-05-26 DIAGNOSIS — D509 Iron deficiency anemia, unspecified: Secondary | ICD-10-CM | POA: Diagnosis not present

## 2022-05-26 DIAGNOSIS — N186 End stage renal disease: Secondary | ICD-10-CM | POA: Diagnosis not present

## 2022-05-27 DIAGNOSIS — D631 Anemia in chronic kidney disease: Secondary | ICD-10-CM | POA: Diagnosis not present

## 2022-05-27 DIAGNOSIS — N2581 Secondary hyperparathyroidism of renal origin: Secondary | ICD-10-CM | POA: Diagnosis not present

## 2022-05-27 DIAGNOSIS — D509 Iron deficiency anemia, unspecified: Secondary | ICD-10-CM | POA: Diagnosis not present

## 2022-05-27 DIAGNOSIS — N186 End stage renal disease: Secondary | ICD-10-CM | POA: Diagnosis not present

## 2022-05-27 DIAGNOSIS — Z992 Dependence on renal dialysis: Secondary | ICD-10-CM | POA: Diagnosis not present

## 2022-05-28 DIAGNOSIS — D631 Anemia in chronic kidney disease: Secondary | ICD-10-CM | POA: Diagnosis not present

## 2022-05-28 DIAGNOSIS — D509 Iron deficiency anemia, unspecified: Secondary | ICD-10-CM | POA: Diagnosis not present

## 2022-05-28 DIAGNOSIS — N186 End stage renal disease: Secondary | ICD-10-CM | POA: Diagnosis not present

## 2022-05-28 DIAGNOSIS — N2581 Secondary hyperparathyroidism of renal origin: Secondary | ICD-10-CM | POA: Diagnosis not present

## 2022-05-28 DIAGNOSIS — Z992 Dependence on renal dialysis: Secondary | ICD-10-CM | POA: Diagnosis not present

## 2022-05-29 DIAGNOSIS — D509 Iron deficiency anemia, unspecified: Secondary | ICD-10-CM | POA: Diagnosis not present

## 2022-05-29 DIAGNOSIS — D631 Anemia in chronic kidney disease: Secondary | ICD-10-CM | POA: Diagnosis not present

## 2022-05-29 DIAGNOSIS — N186 End stage renal disease: Secondary | ICD-10-CM | POA: Diagnosis not present

## 2022-05-29 DIAGNOSIS — N2581 Secondary hyperparathyroidism of renal origin: Secondary | ICD-10-CM | POA: Diagnosis not present

## 2022-05-29 DIAGNOSIS — Z992 Dependence on renal dialysis: Secondary | ICD-10-CM | POA: Diagnosis not present

## 2022-05-30 DIAGNOSIS — N2581 Secondary hyperparathyroidism of renal origin: Secondary | ICD-10-CM | POA: Diagnosis not present

## 2022-05-30 DIAGNOSIS — Z992 Dependence on renal dialysis: Secondary | ICD-10-CM | POA: Diagnosis not present

## 2022-05-30 DIAGNOSIS — N186 End stage renal disease: Secondary | ICD-10-CM | POA: Diagnosis not present

## 2022-05-30 DIAGNOSIS — D509 Iron deficiency anemia, unspecified: Secondary | ICD-10-CM | POA: Diagnosis not present

## 2022-05-30 DIAGNOSIS — D631 Anemia in chronic kidney disease: Secondary | ICD-10-CM | POA: Diagnosis not present

## 2022-05-31 DIAGNOSIS — D631 Anemia in chronic kidney disease: Secondary | ICD-10-CM | POA: Diagnosis not present

## 2022-05-31 DIAGNOSIS — N2581 Secondary hyperparathyroidism of renal origin: Secondary | ICD-10-CM | POA: Diagnosis not present

## 2022-05-31 DIAGNOSIS — D509 Iron deficiency anemia, unspecified: Secondary | ICD-10-CM | POA: Diagnosis not present

## 2022-05-31 DIAGNOSIS — N186 End stage renal disease: Secondary | ICD-10-CM | POA: Diagnosis not present

## 2022-05-31 DIAGNOSIS — Z992 Dependence on renal dialysis: Secondary | ICD-10-CM | POA: Diagnosis not present

## 2022-06-01 DIAGNOSIS — N2581 Secondary hyperparathyroidism of renal origin: Secondary | ICD-10-CM | POA: Diagnosis not present

## 2022-06-01 DIAGNOSIS — N186 End stage renal disease: Secondary | ICD-10-CM | POA: Diagnosis not present

## 2022-06-01 DIAGNOSIS — D509 Iron deficiency anemia, unspecified: Secondary | ICD-10-CM | POA: Diagnosis not present

## 2022-06-01 DIAGNOSIS — Z992 Dependence on renal dialysis: Secondary | ICD-10-CM | POA: Diagnosis not present

## 2022-06-01 DIAGNOSIS — D631 Anemia in chronic kidney disease: Secondary | ICD-10-CM | POA: Diagnosis not present

## 2022-06-02 DIAGNOSIS — D631 Anemia in chronic kidney disease: Secondary | ICD-10-CM | POA: Diagnosis not present

## 2022-06-02 DIAGNOSIS — Z992 Dependence on renal dialysis: Secondary | ICD-10-CM | POA: Diagnosis not present

## 2022-06-02 DIAGNOSIS — D509 Iron deficiency anemia, unspecified: Secondary | ICD-10-CM | POA: Diagnosis not present

## 2022-06-02 DIAGNOSIS — N2581 Secondary hyperparathyroidism of renal origin: Secondary | ICD-10-CM | POA: Diagnosis not present

## 2022-06-02 DIAGNOSIS — N186 End stage renal disease: Secondary | ICD-10-CM | POA: Diagnosis not present

## 2022-06-03 DIAGNOSIS — D631 Anemia in chronic kidney disease: Secondary | ICD-10-CM | POA: Diagnosis not present

## 2022-06-03 DIAGNOSIS — N2581 Secondary hyperparathyroidism of renal origin: Secondary | ICD-10-CM | POA: Diagnosis not present

## 2022-06-03 DIAGNOSIS — N186 End stage renal disease: Secondary | ICD-10-CM | POA: Diagnosis not present

## 2022-06-03 DIAGNOSIS — Z992 Dependence on renal dialysis: Secondary | ICD-10-CM | POA: Diagnosis not present

## 2022-06-03 DIAGNOSIS — D509 Iron deficiency anemia, unspecified: Secondary | ICD-10-CM | POA: Diagnosis not present

## 2022-06-04 DIAGNOSIS — N2581 Secondary hyperparathyroidism of renal origin: Secondary | ICD-10-CM | POA: Diagnosis not present

## 2022-06-04 DIAGNOSIS — Z992 Dependence on renal dialysis: Secondary | ICD-10-CM | POA: Diagnosis not present

## 2022-06-04 DIAGNOSIS — D631 Anemia in chronic kidney disease: Secondary | ICD-10-CM | POA: Diagnosis not present

## 2022-06-04 DIAGNOSIS — N186 End stage renal disease: Secondary | ICD-10-CM | POA: Diagnosis not present

## 2022-06-04 DIAGNOSIS — D509 Iron deficiency anemia, unspecified: Secondary | ICD-10-CM | POA: Diagnosis not present

## 2022-06-05 DIAGNOSIS — D509 Iron deficiency anemia, unspecified: Secondary | ICD-10-CM | POA: Diagnosis not present

## 2022-06-05 DIAGNOSIS — D631 Anemia in chronic kidney disease: Secondary | ICD-10-CM | POA: Diagnosis not present

## 2022-06-05 DIAGNOSIS — Z992 Dependence on renal dialysis: Secondary | ICD-10-CM | POA: Diagnosis not present

## 2022-06-05 DIAGNOSIS — N2581 Secondary hyperparathyroidism of renal origin: Secondary | ICD-10-CM | POA: Diagnosis not present

## 2022-06-05 DIAGNOSIS — N186 End stage renal disease: Secondary | ICD-10-CM | POA: Diagnosis not present

## 2022-06-06 DIAGNOSIS — D509 Iron deficiency anemia, unspecified: Secondary | ICD-10-CM | POA: Diagnosis not present

## 2022-06-06 DIAGNOSIS — N186 End stage renal disease: Secondary | ICD-10-CM | POA: Diagnosis not present

## 2022-06-06 DIAGNOSIS — N2581 Secondary hyperparathyroidism of renal origin: Secondary | ICD-10-CM | POA: Diagnosis not present

## 2022-06-06 DIAGNOSIS — Z992 Dependence on renal dialysis: Secondary | ICD-10-CM | POA: Diagnosis not present

## 2022-06-06 DIAGNOSIS — D631 Anemia in chronic kidney disease: Secondary | ICD-10-CM | POA: Diagnosis not present

## 2022-06-07 DIAGNOSIS — Z992 Dependence on renal dialysis: Secondary | ICD-10-CM | POA: Diagnosis not present

## 2022-06-07 DIAGNOSIS — D631 Anemia in chronic kidney disease: Secondary | ICD-10-CM | POA: Diagnosis not present

## 2022-06-07 DIAGNOSIS — N186 End stage renal disease: Secondary | ICD-10-CM | POA: Diagnosis not present

## 2022-06-07 DIAGNOSIS — N2581 Secondary hyperparathyroidism of renal origin: Secondary | ICD-10-CM | POA: Diagnosis not present

## 2022-06-07 DIAGNOSIS — D509 Iron deficiency anemia, unspecified: Secondary | ICD-10-CM | POA: Diagnosis not present

## 2022-06-08 DIAGNOSIS — Z992 Dependence on renal dialysis: Secondary | ICD-10-CM | POA: Diagnosis not present

## 2022-06-08 DIAGNOSIS — N186 End stage renal disease: Secondary | ICD-10-CM | POA: Diagnosis not present

## 2022-06-08 DIAGNOSIS — D631 Anemia in chronic kidney disease: Secondary | ICD-10-CM | POA: Diagnosis not present

## 2022-06-08 DIAGNOSIS — N2581 Secondary hyperparathyroidism of renal origin: Secondary | ICD-10-CM | POA: Diagnosis not present

## 2022-06-08 DIAGNOSIS — G4733 Obstructive sleep apnea (adult) (pediatric): Secondary | ICD-10-CM | POA: Diagnosis not present

## 2022-06-09 DIAGNOSIS — D631 Anemia in chronic kidney disease: Secondary | ICD-10-CM | POA: Diagnosis not present

## 2022-06-09 DIAGNOSIS — N186 End stage renal disease: Secondary | ICD-10-CM | POA: Diagnosis not present

## 2022-06-09 DIAGNOSIS — N2581 Secondary hyperparathyroidism of renal origin: Secondary | ICD-10-CM | POA: Diagnosis not present

## 2022-06-09 DIAGNOSIS — Z992 Dependence on renal dialysis: Secondary | ICD-10-CM | POA: Diagnosis not present

## 2022-06-10 DIAGNOSIS — L97512 Non-pressure chronic ulcer of other part of right foot with fat layer exposed: Secondary | ICD-10-CM | POA: Diagnosis not present

## 2022-06-10 DIAGNOSIS — N2581 Secondary hyperparathyroidism of renal origin: Secondary | ICD-10-CM | POA: Diagnosis not present

## 2022-06-10 DIAGNOSIS — Z794 Long term (current) use of insulin: Secondary | ICD-10-CM | POA: Diagnosis not present

## 2022-06-10 DIAGNOSIS — E11621 Type 2 diabetes mellitus with foot ulcer: Secondary | ICD-10-CM | POA: Diagnosis not present

## 2022-06-10 DIAGNOSIS — Z89422 Acquired absence of other left toe(s): Secondary | ICD-10-CM | POA: Diagnosis not present

## 2022-06-10 DIAGNOSIS — Z992 Dependence on renal dialysis: Secondary | ICD-10-CM | POA: Diagnosis not present

## 2022-06-10 DIAGNOSIS — L97509 Non-pressure chronic ulcer of other part of unspecified foot with unspecified severity: Secondary | ICD-10-CM | POA: Diagnosis not present

## 2022-06-10 DIAGNOSIS — N186 End stage renal disease: Secondary | ICD-10-CM | POA: Diagnosis not present

## 2022-06-10 DIAGNOSIS — D631 Anemia in chronic kidney disease: Secondary | ICD-10-CM | POA: Diagnosis not present

## 2022-06-10 DIAGNOSIS — L97522 Non-pressure chronic ulcer of other part of left foot with fat layer exposed: Secondary | ICD-10-CM | POA: Diagnosis not present

## 2022-06-11 DIAGNOSIS — D631 Anemia in chronic kidney disease: Secondary | ICD-10-CM | POA: Diagnosis not present

## 2022-06-11 DIAGNOSIS — N186 End stage renal disease: Secondary | ICD-10-CM | POA: Diagnosis not present

## 2022-06-11 DIAGNOSIS — N2581 Secondary hyperparathyroidism of renal origin: Secondary | ICD-10-CM | POA: Diagnosis not present

## 2022-06-11 DIAGNOSIS — Z992 Dependence on renal dialysis: Secondary | ICD-10-CM | POA: Diagnosis not present

## 2022-06-12 DIAGNOSIS — Z992 Dependence on renal dialysis: Secondary | ICD-10-CM | POA: Diagnosis not present

## 2022-06-12 DIAGNOSIS — D631 Anemia in chronic kidney disease: Secondary | ICD-10-CM | POA: Diagnosis not present

## 2022-06-12 DIAGNOSIS — N2581 Secondary hyperparathyroidism of renal origin: Secondary | ICD-10-CM | POA: Diagnosis not present

## 2022-06-12 DIAGNOSIS — N186 End stage renal disease: Secondary | ICD-10-CM | POA: Diagnosis not present

## 2022-06-13 DIAGNOSIS — D631 Anemia in chronic kidney disease: Secondary | ICD-10-CM | POA: Diagnosis not present

## 2022-06-13 DIAGNOSIS — Z992 Dependence on renal dialysis: Secondary | ICD-10-CM | POA: Diagnosis not present

## 2022-06-13 DIAGNOSIS — N186 End stage renal disease: Secondary | ICD-10-CM | POA: Diagnosis not present

## 2022-06-13 DIAGNOSIS — N2581 Secondary hyperparathyroidism of renal origin: Secondary | ICD-10-CM | POA: Diagnosis not present

## 2022-06-14 DIAGNOSIS — D631 Anemia in chronic kidney disease: Secondary | ICD-10-CM | POA: Diagnosis not present

## 2022-06-14 DIAGNOSIS — N186 End stage renal disease: Secondary | ICD-10-CM | POA: Diagnosis not present

## 2022-06-14 DIAGNOSIS — Z992 Dependence on renal dialysis: Secondary | ICD-10-CM | POA: Diagnosis not present

## 2022-06-14 DIAGNOSIS — N2581 Secondary hyperparathyroidism of renal origin: Secondary | ICD-10-CM | POA: Diagnosis not present

## 2022-06-15 DIAGNOSIS — Z992 Dependence on renal dialysis: Secondary | ICD-10-CM | POA: Diagnosis not present

## 2022-06-15 DIAGNOSIS — E119 Type 2 diabetes mellitus without complications: Secondary | ICD-10-CM | POA: Diagnosis not present

## 2022-06-15 DIAGNOSIS — L97509 Non-pressure chronic ulcer of other part of unspecified foot with unspecified severity: Secondary | ICD-10-CM | POA: Diagnosis not present

## 2022-06-15 DIAGNOSIS — E11621 Type 2 diabetes mellitus with foot ulcer: Secondary | ICD-10-CM | POA: Diagnosis not present

## 2022-06-15 DIAGNOSIS — D631 Anemia in chronic kidney disease: Secondary | ICD-10-CM | POA: Diagnosis not present

## 2022-06-15 DIAGNOSIS — N2581 Secondary hyperparathyroidism of renal origin: Secondary | ICD-10-CM | POA: Diagnosis not present

## 2022-06-15 DIAGNOSIS — Z89422 Acquired absence of other left toe(s): Secondary | ICD-10-CM | POA: Diagnosis not present

## 2022-06-15 DIAGNOSIS — N186 End stage renal disease: Secondary | ICD-10-CM | POA: Diagnosis not present

## 2022-06-15 DIAGNOSIS — Z794 Long term (current) use of insulin: Secondary | ICD-10-CM | POA: Diagnosis not present

## 2022-06-15 DIAGNOSIS — L97522 Non-pressure chronic ulcer of other part of left foot with fat layer exposed: Secondary | ICD-10-CM | POA: Diagnosis not present

## 2022-06-16 DIAGNOSIS — N2581 Secondary hyperparathyroidism of renal origin: Secondary | ICD-10-CM | POA: Diagnosis not present

## 2022-06-16 DIAGNOSIS — D631 Anemia in chronic kidney disease: Secondary | ICD-10-CM | POA: Diagnosis not present

## 2022-06-16 DIAGNOSIS — Z992 Dependence on renal dialysis: Secondary | ICD-10-CM | POA: Diagnosis not present

## 2022-06-16 DIAGNOSIS — N186 End stage renal disease: Secondary | ICD-10-CM | POA: Diagnosis not present

## 2022-06-17 DIAGNOSIS — N2581 Secondary hyperparathyroidism of renal origin: Secondary | ICD-10-CM | POA: Diagnosis not present

## 2022-06-17 DIAGNOSIS — D631 Anemia in chronic kidney disease: Secondary | ICD-10-CM | POA: Diagnosis not present

## 2022-06-17 DIAGNOSIS — Z992 Dependence on renal dialysis: Secondary | ICD-10-CM | POA: Diagnosis not present

## 2022-06-17 DIAGNOSIS — N186 End stage renal disease: Secondary | ICD-10-CM | POA: Diagnosis not present

## 2022-06-18 DIAGNOSIS — N2581 Secondary hyperparathyroidism of renal origin: Secondary | ICD-10-CM | POA: Diagnosis not present

## 2022-06-18 DIAGNOSIS — Z992 Dependence on renal dialysis: Secondary | ICD-10-CM | POA: Diagnosis not present

## 2022-06-18 DIAGNOSIS — D631 Anemia in chronic kidney disease: Secondary | ICD-10-CM | POA: Diagnosis not present

## 2022-06-18 DIAGNOSIS — N186 End stage renal disease: Secondary | ICD-10-CM | POA: Diagnosis not present

## 2022-06-19 DIAGNOSIS — N2581 Secondary hyperparathyroidism of renal origin: Secondary | ICD-10-CM | POA: Diagnosis not present

## 2022-06-19 DIAGNOSIS — N186 End stage renal disease: Secondary | ICD-10-CM | POA: Diagnosis not present

## 2022-06-19 DIAGNOSIS — D631 Anemia in chronic kidney disease: Secondary | ICD-10-CM | POA: Diagnosis not present

## 2022-06-19 DIAGNOSIS — Z992 Dependence on renal dialysis: Secondary | ICD-10-CM | POA: Diagnosis not present

## 2022-06-20 DIAGNOSIS — N2581 Secondary hyperparathyroidism of renal origin: Secondary | ICD-10-CM | POA: Diagnosis not present

## 2022-06-20 DIAGNOSIS — N186 End stage renal disease: Secondary | ICD-10-CM | POA: Diagnosis not present

## 2022-06-20 DIAGNOSIS — D631 Anemia in chronic kidney disease: Secondary | ICD-10-CM | POA: Diagnosis not present

## 2022-06-20 DIAGNOSIS — Z992 Dependence on renal dialysis: Secondary | ICD-10-CM | POA: Diagnosis not present

## 2022-06-21 DIAGNOSIS — D631 Anemia in chronic kidney disease: Secondary | ICD-10-CM | POA: Diagnosis not present

## 2022-06-21 DIAGNOSIS — N186 End stage renal disease: Secondary | ICD-10-CM | POA: Diagnosis not present

## 2022-06-21 DIAGNOSIS — Z992 Dependence on renal dialysis: Secondary | ICD-10-CM | POA: Diagnosis not present

## 2022-06-21 DIAGNOSIS — N2581 Secondary hyperparathyroidism of renal origin: Secondary | ICD-10-CM | POA: Diagnosis not present

## 2022-06-22 DIAGNOSIS — Z992 Dependence on renal dialysis: Secondary | ICD-10-CM | POA: Diagnosis not present

## 2022-06-22 DIAGNOSIS — N2581 Secondary hyperparathyroidism of renal origin: Secondary | ICD-10-CM | POA: Diagnosis not present

## 2022-06-22 DIAGNOSIS — D631 Anemia in chronic kidney disease: Secondary | ICD-10-CM | POA: Diagnosis not present

## 2022-06-22 DIAGNOSIS — N186 End stage renal disease: Secondary | ICD-10-CM | POA: Diagnosis not present

## 2022-06-23 DIAGNOSIS — N186 End stage renal disease: Secondary | ICD-10-CM | POA: Diagnosis not present

## 2022-06-23 DIAGNOSIS — L97521 Non-pressure chronic ulcer of other part of left foot limited to breakdown of skin: Secondary | ICD-10-CM | POA: Diagnosis not present

## 2022-06-23 DIAGNOSIS — N2581 Secondary hyperparathyroidism of renal origin: Secondary | ICD-10-CM | POA: Diagnosis not present

## 2022-06-23 DIAGNOSIS — L97512 Non-pressure chronic ulcer of other part of right foot with fat layer exposed: Secondary | ICD-10-CM | POA: Diagnosis not present

## 2022-06-23 DIAGNOSIS — Z992 Dependence on renal dialysis: Secondary | ICD-10-CM | POA: Diagnosis not present

## 2022-06-23 DIAGNOSIS — L97522 Non-pressure chronic ulcer of other part of left foot with fat layer exposed: Secondary | ICD-10-CM | POA: Diagnosis not present

## 2022-06-23 DIAGNOSIS — D631 Anemia in chronic kidney disease: Secondary | ICD-10-CM | POA: Diagnosis not present

## 2022-06-24 DIAGNOSIS — Z992 Dependence on renal dialysis: Secondary | ICD-10-CM | POA: Diagnosis not present

## 2022-06-24 DIAGNOSIS — N186 End stage renal disease: Secondary | ICD-10-CM | POA: Diagnosis not present

## 2022-06-24 DIAGNOSIS — N2581 Secondary hyperparathyroidism of renal origin: Secondary | ICD-10-CM | POA: Diagnosis not present

## 2022-06-24 DIAGNOSIS — D631 Anemia in chronic kidney disease: Secondary | ICD-10-CM | POA: Diagnosis not present

## 2022-06-25 DIAGNOSIS — N186 End stage renal disease: Secondary | ICD-10-CM | POA: Diagnosis not present

## 2022-06-25 DIAGNOSIS — D631 Anemia in chronic kidney disease: Secondary | ICD-10-CM | POA: Diagnosis not present

## 2022-06-25 DIAGNOSIS — Z992 Dependence on renal dialysis: Secondary | ICD-10-CM | POA: Diagnosis not present

## 2022-06-25 DIAGNOSIS — N2581 Secondary hyperparathyroidism of renal origin: Secondary | ICD-10-CM | POA: Diagnosis not present

## 2022-06-26 DIAGNOSIS — N186 End stage renal disease: Secondary | ICD-10-CM | POA: Diagnosis not present

## 2022-06-26 DIAGNOSIS — N2581 Secondary hyperparathyroidism of renal origin: Secondary | ICD-10-CM | POA: Diagnosis not present

## 2022-06-26 DIAGNOSIS — Z992 Dependence on renal dialysis: Secondary | ICD-10-CM | POA: Diagnosis not present

## 2022-06-26 DIAGNOSIS — D631 Anemia in chronic kidney disease: Secondary | ICD-10-CM | POA: Diagnosis not present

## 2022-06-27 DIAGNOSIS — D631 Anemia in chronic kidney disease: Secondary | ICD-10-CM | POA: Diagnosis not present

## 2022-06-27 DIAGNOSIS — N2581 Secondary hyperparathyroidism of renal origin: Secondary | ICD-10-CM | POA: Diagnosis not present

## 2022-06-27 DIAGNOSIS — Z992 Dependence on renal dialysis: Secondary | ICD-10-CM | POA: Diagnosis not present

## 2022-06-27 DIAGNOSIS — N186 End stage renal disease: Secondary | ICD-10-CM | POA: Diagnosis not present

## 2022-06-28 DIAGNOSIS — D631 Anemia in chronic kidney disease: Secondary | ICD-10-CM | POA: Diagnosis not present

## 2022-06-28 DIAGNOSIS — N186 End stage renal disease: Secondary | ICD-10-CM | POA: Diagnosis not present

## 2022-06-28 DIAGNOSIS — Z992 Dependence on renal dialysis: Secondary | ICD-10-CM | POA: Diagnosis not present

## 2022-06-28 DIAGNOSIS — N2581 Secondary hyperparathyroidism of renal origin: Secondary | ICD-10-CM | POA: Diagnosis not present

## 2022-06-29 DIAGNOSIS — D631 Anemia in chronic kidney disease: Secondary | ICD-10-CM | POA: Diagnosis not present

## 2022-06-29 DIAGNOSIS — N186 End stage renal disease: Secondary | ICD-10-CM | POA: Diagnosis not present

## 2022-06-29 DIAGNOSIS — N2581 Secondary hyperparathyroidism of renal origin: Secondary | ICD-10-CM | POA: Diagnosis not present

## 2022-06-29 DIAGNOSIS — Z992 Dependence on renal dialysis: Secondary | ICD-10-CM | POA: Diagnosis not present

## 2022-06-30 DIAGNOSIS — I872 Venous insufficiency (chronic) (peripheral): Secondary | ICD-10-CM | POA: Diagnosis not present

## 2022-06-30 DIAGNOSIS — Z992 Dependence on renal dialysis: Secondary | ICD-10-CM | POA: Diagnosis not present

## 2022-06-30 DIAGNOSIS — N186 End stage renal disease: Secondary | ICD-10-CM | POA: Diagnosis not present

## 2022-06-30 DIAGNOSIS — D631 Anemia in chronic kidney disease: Secondary | ICD-10-CM | POA: Diagnosis not present

## 2022-06-30 DIAGNOSIS — N2581 Secondary hyperparathyroidism of renal origin: Secondary | ICD-10-CM | POA: Diagnosis not present

## 2022-07-01 DIAGNOSIS — Z992 Dependence on renal dialysis: Secondary | ICD-10-CM | POA: Diagnosis not present

## 2022-07-01 DIAGNOSIS — D631 Anemia in chronic kidney disease: Secondary | ICD-10-CM | POA: Diagnosis not present

## 2022-07-01 DIAGNOSIS — N2581 Secondary hyperparathyroidism of renal origin: Secondary | ICD-10-CM | POA: Diagnosis not present

## 2022-07-01 DIAGNOSIS — N186 End stage renal disease: Secondary | ICD-10-CM | POA: Diagnosis not present

## 2022-07-02 DIAGNOSIS — N2581 Secondary hyperparathyroidism of renal origin: Secondary | ICD-10-CM | POA: Diagnosis not present

## 2022-07-02 DIAGNOSIS — N186 End stage renal disease: Secondary | ICD-10-CM | POA: Diagnosis not present

## 2022-07-02 DIAGNOSIS — Z992 Dependence on renal dialysis: Secondary | ICD-10-CM | POA: Diagnosis not present

## 2022-07-02 DIAGNOSIS — D631 Anemia in chronic kidney disease: Secondary | ICD-10-CM | POA: Diagnosis not present

## 2022-07-03 DIAGNOSIS — N186 End stage renal disease: Secondary | ICD-10-CM | POA: Diagnosis not present

## 2022-07-03 DIAGNOSIS — N2581 Secondary hyperparathyroidism of renal origin: Secondary | ICD-10-CM | POA: Diagnosis not present

## 2022-07-03 DIAGNOSIS — D631 Anemia in chronic kidney disease: Secondary | ICD-10-CM | POA: Diagnosis not present

## 2022-07-03 DIAGNOSIS — Z992 Dependence on renal dialysis: Secondary | ICD-10-CM | POA: Diagnosis not present

## 2022-07-04 DIAGNOSIS — Z992 Dependence on renal dialysis: Secondary | ICD-10-CM | POA: Diagnosis not present

## 2022-07-04 DIAGNOSIS — N186 End stage renal disease: Secondary | ICD-10-CM | POA: Diagnosis not present

## 2022-07-04 DIAGNOSIS — N2581 Secondary hyperparathyroidism of renal origin: Secondary | ICD-10-CM | POA: Diagnosis not present

## 2022-07-04 DIAGNOSIS — D631 Anemia in chronic kidney disease: Secondary | ICD-10-CM | POA: Diagnosis not present

## 2022-07-05 DIAGNOSIS — D631 Anemia in chronic kidney disease: Secondary | ICD-10-CM | POA: Diagnosis not present

## 2022-07-05 DIAGNOSIS — N2581 Secondary hyperparathyroidism of renal origin: Secondary | ICD-10-CM | POA: Diagnosis not present

## 2022-07-05 DIAGNOSIS — N186 End stage renal disease: Secondary | ICD-10-CM | POA: Diagnosis not present

## 2022-07-05 DIAGNOSIS — Z992 Dependence on renal dialysis: Secondary | ICD-10-CM | POA: Diagnosis not present

## 2022-07-06 DIAGNOSIS — N186 End stage renal disease: Secondary | ICD-10-CM | POA: Diagnosis not present

## 2022-07-06 DIAGNOSIS — D631 Anemia in chronic kidney disease: Secondary | ICD-10-CM | POA: Diagnosis not present

## 2022-07-06 DIAGNOSIS — N2581 Secondary hyperparathyroidism of renal origin: Secondary | ICD-10-CM | POA: Diagnosis not present

## 2022-07-06 DIAGNOSIS — Z992 Dependence on renal dialysis: Secondary | ICD-10-CM | POA: Diagnosis not present

## 2022-07-07 DIAGNOSIS — L97512 Non-pressure chronic ulcer of other part of right foot with fat layer exposed: Secondary | ICD-10-CM | POA: Diagnosis not present

## 2022-07-07 DIAGNOSIS — D631 Anemia in chronic kidney disease: Secondary | ICD-10-CM | POA: Diagnosis not present

## 2022-07-07 DIAGNOSIS — L97422 Non-pressure chronic ulcer of left heel and midfoot with fat layer exposed: Secondary | ICD-10-CM | POA: Diagnosis not present

## 2022-07-07 DIAGNOSIS — N186 End stage renal disease: Secondary | ICD-10-CM | POA: Diagnosis not present

## 2022-07-07 DIAGNOSIS — Z992 Dependence on renal dialysis: Secondary | ICD-10-CM | POA: Diagnosis not present

## 2022-07-07 DIAGNOSIS — N2581 Secondary hyperparathyroidism of renal origin: Secondary | ICD-10-CM | POA: Diagnosis not present

## 2022-07-08 DIAGNOSIS — Z992 Dependence on renal dialysis: Secondary | ICD-10-CM | POA: Diagnosis not present

## 2022-07-08 DIAGNOSIS — D631 Anemia in chronic kidney disease: Secondary | ICD-10-CM | POA: Diagnosis not present

## 2022-07-08 DIAGNOSIS — N186 End stage renal disease: Secondary | ICD-10-CM | POA: Diagnosis not present

## 2022-07-08 DIAGNOSIS — N2581 Secondary hyperparathyroidism of renal origin: Secondary | ICD-10-CM | POA: Diagnosis not present

## 2022-07-09 DIAGNOSIS — Z992 Dependence on renal dialysis: Secondary | ICD-10-CM | POA: Diagnosis not present

## 2022-07-09 DIAGNOSIS — D631 Anemia in chronic kidney disease: Secondary | ICD-10-CM | POA: Diagnosis not present

## 2022-07-09 DIAGNOSIS — Z4932 Encounter for adequacy testing for peritoneal dialysis: Secondary | ICD-10-CM | POA: Diagnosis not present

## 2022-07-09 DIAGNOSIS — N186 End stage renal disease: Secondary | ICD-10-CM | POA: Diagnosis not present

## 2022-07-09 DIAGNOSIS — N2581 Secondary hyperparathyroidism of renal origin: Secondary | ICD-10-CM | POA: Diagnosis not present

## 2022-07-10 DIAGNOSIS — Z4932 Encounter for adequacy testing for peritoneal dialysis: Secondary | ICD-10-CM | POA: Diagnosis not present

## 2022-07-10 DIAGNOSIS — N186 End stage renal disease: Secondary | ICD-10-CM | POA: Diagnosis not present

## 2022-07-10 DIAGNOSIS — Z992 Dependence on renal dialysis: Secondary | ICD-10-CM | POA: Diagnosis not present

## 2022-07-10 DIAGNOSIS — D631 Anemia in chronic kidney disease: Secondary | ICD-10-CM | POA: Diagnosis not present

## 2022-07-10 DIAGNOSIS — N2581 Secondary hyperparathyroidism of renal origin: Secondary | ICD-10-CM | POA: Diagnosis not present

## 2022-07-11 DIAGNOSIS — N186 End stage renal disease: Secondary | ICD-10-CM | POA: Diagnosis not present

## 2022-07-11 DIAGNOSIS — N2581 Secondary hyperparathyroidism of renal origin: Secondary | ICD-10-CM | POA: Diagnosis not present

## 2022-07-11 DIAGNOSIS — Z992 Dependence on renal dialysis: Secondary | ICD-10-CM | POA: Diagnosis not present

## 2022-07-11 DIAGNOSIS — Z4932 Encounter for adequacy testing for peritoneal dialysis: Secondary | ICD-10-CM | POA: Diagnosis not present

## 2022-07-11 DIAGNOSIS — D631 Anemia in chronic kidney disease: Secondary | ICD-10-CM | POA: Diagnosis not present

## 2022-07-12 DIAGNOSIS — N2581 Secondary hyperparathyroidism of renal origin: Secondary | ICD-10-CM | POA: Diagnosis not present

## 2022-07-12 DIAGNOSIS — D631 Anemia in chronic kidney disease: Secondary | ICD-10-CM | POA: Diagnosis not present

## 2022-07-12 DIAGNOSIS — N186 End stage renal disease: Secondary | ICD-10-CM | POA: Diagnosis not present

## 2022-07-12 DIAGNOSIS — Z4932 Encounter for adequacy testing for peritoneal dialysis: Secondary | ICD-10-CM | POA: Diagnosis not present

## 2022-07-12 DIAGNOSIS — Z992 Dependence on renal dialysis: Secondary | ICD-10-CM | POA: Diagnosis not present

## 2022-07-13 DIAGNOSIS — D631 Anemia in chronic kidney disease: Secondary | ICD-10-CM | POA: Diagnosis not present

## 2022-07-13 DIAGNOSIS — Z4932 Encounter for adequacy testing for peritoneal dialysis: Secondary | ICD-10-CM | POA: Diagnosis not present

## 2022-07-13 DIAGNOSIS — N186 End stage renal disease: Secondary | ICD-10-CM | POA: Diagnosis not present

## 2022-07-13 DIAGNOSIS — Z992 Dependence on renal dialysis: Secondary | ICD-10-CM | POA: Diagnosis not present

## 2022-07-13 DIAGNOSIS — N2581 Secondary hyperparathyroidism of renal origin: Secondary | ICD-10-CM | POA: Diagnosis not present

## 2022-07-14 DIAGNOSIS — D631 Anemia in chronic kidney disease: Secondary | ICD-10-CM | POA: Diagnosis not present

## 2022-07-14 DIAGNOSIS — I739 Peripheral vascular disease, unspecified: Secondary | ICD-10-CM | POA: Diagnosis not present

## 2022-07-14 DIAGNOSIS — N2581 Secondary hyperparathyroidism of renal origin: Secondary | ICD-10-CM | POA: Diagnosis not present

## 2022-07-14 DIAGNOSIS — I872 Venous insufficiency (chronic) (peripheral): Secondary | ICD-10-CM | POA: Diagnosis not present

## 2022-07-14 DIAGNOSIS — L97522 Non-pressure chronic ulcer of other part of left foot with fat layer exposed: Secondary | ICD-10-CM | POA: Diagnosis not present

## 2022-07-14 DIAGNOSIS — Z4932 Encounter for adequacy testing for peritoneal dialysis: Secondary | ICD-10-CM | POA: Diagnosis not present

## 2022-07-14 DIAGNOSIS — Z992 Dependence on renal dialysis: Secondary | ICD-10-CM | POA: Diagnosis not present

## 2022-07-14 DIAGNOSIS — R600 Localized edema: Secondary | ICD-10-CM | POA: Diagnosis not present

## 2022-07-14 DIAGNOSIS — T1490XA Injury, unspecified, initial encounter: Secondary | ICD-10-CM | POA: Diagnosis not present

## 2022-07-14 DIAGNOSIS — N186 End stage renal disease: Secondary | ICD-10-CM | POA: Diagnosis not present

## 2022-07-15 DIAGNOSIS — N2581 Secondary hyperparathyroidism of renal origin: Secondary | ICD-10-CM | POA: Diagnosis not present

## 2022-07-15 DIAGNOSIS — D631 Anemia in chronic kidney disease: Secondary | ICD-10-CM | POA: Diagnosis not present

## 2022-07-15 DIAGNOSIS — Z992 Dependence on renal dialysis: Secondary | ICD-10-CM | POA: Diagnosis not present

## 2022-07-15 DIAGNOSIS — N186 End stage renal disease: Secondary | ICD-10-CM | POA: Diagnosis not present

## 2022-07-15 DIAGNOSIS — Z4932 Encounter for adequacy testing for peritoneal dialysis: Secondary | ICD-10-CM | POA: Diagnosis not present

## 2022-07-16 DIAGNOSIS — D631 Anemia in chronic kidney disease: Secondary | ICD-10-CM | POA: Diagnosis not present

## 2022-07-16 DIAGNOSIS — N186 End stage renal disease: Secondary | ICD-10-CM | POA: Diagnosis not present

## 2022-07-16 DIAGNOSIS — Z992 Dependence on renal dialysis: Secondary | ICD-10-CM | POA: Diagnosis not present

## 2022-07-16 DIAGNOSIS — N2581 Secondary hyperparathyroidism of renal origin: Secondary | ICD-10-CM | POA: Diagnosis not present

## 2022-07-16 DIAGNOSIS — Z4932 Encounter for adequacy testing for peritoneal dialysis: Secondary | ICD-10-CM | POA: Diagnosis not present

## 2022-07-17 DIAGNOSIS — Z4932 Encounter for adequacy testing for peritoneal dialysis: Secondary | ICD-10-CM | POA: Diagnosis not present

## 2022-07-17 DIAGNOSIS — N2581 Secondary hyperparathyroidism of renal origin: Secondary | ICD-10-CM | POA: Diagnosis not present

## 2022-07-17 DIAGNOSIS — Z992 Dependence on renal dialysis: Secondary | ICD-10-CM | POA: Diagnosis not present

## 2022-07-17 DIAGNOSIS — D631 Anemia in chronic kidney disease: Secondary | ICD-10-CM | POA: Diagnosis not present

## 2022-07-17 DIAGNOSIS — N186 End stage renal disease: Secondary | ICD-10-CM | POA: Diagnosis not present

## 2022-07-18 DIAGNOSIS — Z992 Dependence on renal dialysis: Secondary | ICD-10-CM | POA: Diagnosis not present

## 2022-07-18 DIAGNOSIS — N186 End stage renal disease: Secondary | ICD-10-CM | POA: Diagnosis not present

## 2022-07-18 DIAGNOSIS — D631 Anemia in chronic kidney disease: Secondary | ICD-10-CM | POA: Diagnosis not present

## 2022-07-18 DIAGNOSIS — N2581 Secondary hyperparathyroidism of renal origin: Secondary | ICD-10-CM | POA: Diagnosis not present

## 2022-07-18 DIAGNOSIS — Z4932 Encounter for adequacy testing for peritoneal dialysis: Secondary | ICD-10-CM | POA: Diagnosis not present

## 2022-07-19 DIAGNOSIS — D631 Anemia in chronic kidney disease: Secondary | ICD-10-CM | POA: Diagnosis not present

## 2022-07-19 DIAGNOSIS — Z4932 Encounter for adequacy testing for peritoneal dialysis: Secondary | ICD-10-CM | POA: Diagnosis not present

## 2022-07-19 DIAGNOSIS — Z992 Dependence on renal dialysis: Secondary | ICD-10-CM | POA: Diagnosis not present

## 2022-07-19 DIAGNOSIS — N2581 Secondary hyperparathyroidism of renal origin: Secondary | ICD-10-CM | POA: Diagnosis not present

## 2022-07-19 DIAGNOSIS — N186 End stage renal disease: Secondary | ICD-10-CM | POA: Diagnosis not present

## 2022-07-20 DIAGNOSIS — N186 End stage renal disease: Secondary | ICD-10-CM | POA: Diagnosis not present

## 2022-07-20 DIAGNOSIS — N2581 Secondary hyperparathyroidism of renal origin: Secondary | ICD-10-CM | POA: Diagnosis not present

## 2022-07-20 DIAGNOSIS — D631 Anemia in chronic kidney disease: Secondary | ICD-10-CM | POA: Diagnosis not present

## 2022-07-20 DIAGNOSIS — Z4932 Encounter for adequacy testing for peritoneal dialysis: Secondary | ICD-10-CM | POA: Diagnosis not present

## 2022-07-20 DIAGNOSIS — Z992 Dependence on renal dialysis: Secondary | ICD-10-CM | POA: Diagnosis not present

## 2022-07-21 DIAGNOSIS — N186 End stage renal disease: Secondary | ICD-10-CM | POA: Diagnosis not present

## 2022-07-21 DIAGNOSIS — I872 Venous insufficiency (chronic) (peripheral): Secondary | ICD-10-CM | POA: Diagnosis not present

## 2022-07-21 DIAGNOSIS — D631 Anemia in chronic kidney disease: Secondary | ICD-10-CM | POA: Diagnosis not present

## 2022-07-21 DIAGNOSIS — Z4932 Encounter for adequacy testing for peritoneal dialysis: Secondary | ICD-10-CM | POA: Diagnosis not present

## 2022-07-21 DIAGNOSIS — Z992 Dependence on renal dialysis: Secondary | ICD-10-CM | POA: Diagnosis not present

## 2022-07-21 DIAGNOSIS — N2581 Secondary hyperparathyroidism of renal origin: Secondary | ICD-10-CM | POA: Diagnosis not present

## 2022-07-22 DIAGNOSIS — D631 Anemia in chronic kidney disease: Secondary | ICD-10-CM | POA: Diagnosis not present

## 2022-07-22 DIAGNOSIS — Z4932 Encounter for adequacy testing for peritoneal dialysis: Secondary | ICD-10-CM | POA: Diagnosis not present

## 2022-07-22 DIAGNOSIS — Z992 Dependence on renal dialysis: Secondary | ICD-10-CM | POA: Diagnosis not present

## 2022-07-22 DIAGNOSIS — N2581 Secondary hyperparathyroidism of renal origin: Secondary | ICD-10-CM | POA: Diagnosis not present

## 2022-07-22 DIAGNOSIS — N186 End stage renal disease: Secondary | ICD-10-CM | POA: Diagnosis not present

## 2022-07-23 DIAGNOSIS — N186 End stage renal disease: Secondary | ICD-10-CM | POA: Diagnosis not present

## 2022-07-23 DIAGNOSIS — N2581 Secondary hyperparathyroidism of renal origin: Secondary | ICD-10-CM | POA: Diagnosis not present

## 2022-07-23 DIAGNOSIS — Z992 Dependence on renal dialysis: Secondary | ICD-10-CM | POA: Diagnosis not present

## 2022-07-23 DIAGNOSIS — D631 Anemia in chronic kidney disease: Secondary | ICD-10-CM | POA: Diagnosis not present

## 2022-07-23 DIAGNOSIS — Z4932 Encounter for adequacy testing for peritoneal dialysis: Secondary | ICD-10-CM | POA: Diagnosis not present

## 2022-07-24 DIAGNOSIS — N2581 Secondary hyperparathyroidism of renal origin: Secondary | ICD-10-CM | POA: Diagnosis not present

## 2022-07-24 DIAGNOSIS — Z4932 Encounter for adequacy testing for peritoneal dialysis: Secondary | ICD-10-CM | POA: Diagnosis not present

## 2022-07-24 DIAGNOSIS — N186 End stage renal disease: Secondary | ICD-10-CM | POA: Diagnosis not present

## 2022-07-24 DIAGNOSIS — Z992 Dependence on renal dialysis: Secondary | ICD-10-CM | POA: Diagnosis not present

## 2022-07-24 DIAGNOSIS — D631 Anemia in chronic kidney disease: Secondary | ICD-10-CM | POA: Diagnosis not present

## 2022-07-25 DIAGNOSIS — Z992 Dependence on renal dialysis: Secondary | ICD-10-CM | POA: Diagnosis not present

## 2022-07-25 DIAGNOSIS — Z4932 Encounter for adequacy testing for peritoneal dialysis: Secondary | ICD-10-CM | POA: Diagnosis not present

## 2022-07-25 DIAGNOSIS — D631 Anemia in chronic kidney disease: Secondary | ICD-10-CM | POA: Diagnosis not present

## 2022-07-25 DIAGNOSIS — N2581 Secondary hyperparathyroidism of renal origin: Secondary | ICD-10-CM | POA: Diagnosis not present

## 2022-07-25 DIAGNOSIS — N186 End stage renal disease: Secondary | ICD-10-CM | POA: Diagnosis not present

## 2022-07-26 DIAGNOSIS — N2581 Secondary hyperparathyroidism of renal origin: Secondary | ICD-10-CM | POA: Diagnosis not present

## 2022-07-26 DIAGNOSIS — Z4932 Encounter for adequacy testing for peritoneal dialysis: Secondary | ICD-10-CM | POA: Diagnosis not present

## 2022-07-26 DIAGNOSIS — N186 End stage renal disease: Secondary | ICD-10-CM | POA: Diagnosis not present

## 2022-07-26 DIAGNOSIS — Z992 Dependence on renal dialysis: Secondary | ICD-10-CM | POA: Diagnosis not present

## 2022-07-26 DIAGNOSIS — D631 Anemia in chronic kidney disease: Secondary | ICD-10-CM | POA: Diagnosis not present

## 2022-07-27 DIAGNOSIS — D631 Anemia in chronic kidney disease: Secondary | ICD-10-CM | POA: Diagnosis not present

## 2022-07-27 DIAGNOSIS — Z4932 Encounter for adequacy testing for peritoneal dialysis: Secondary | ICD-10-CM | POA: Diagnosis not present

## 2022-07-27 DIAGNOSIS — N2581 Secondary hyperparathyroidism of renal origin: Secondary | ICD-10-CM | POA: Diagnosis not present

## 2022-07-27 DIAGNOSIS — Z992 Dependence on renal dialysis: Secondary | ICD-10-CM | POA: Diagnosis not present

## 2022-07-27 DIAGNOSIS — N186 End stage renal disease: Secondary | ICD-10-CM | POA: Diagnosis not present

## 2022-07-28 DIAGNOSIS — N2581 Secondary hyperparathyroidism of renal origin: Secondary | ICD-10-CM | POA: Diagnosis not present

## 2022-07-28 DIAGNOSIS — Z992 Dependence on renal dialysis: Secondary | ICD-10-CM | POA: Diagnosis not present

## 2022-07-28 DIAGNOSIS — D631 Anemia in chronic kidney disease: Secondary | ICD-10-CM | POA: Diagnosis not present

## 2022-07-28 DIAGNOSIS — Z4932 Encounter for adequacy testing for peritoneal dialysis: Secondary | ICD-10-CM | POA: Diagnosis not present

## 2022-07-28 DIAGNOSIS — N186 End stage renal disease: Secondary | ICD-10-CM | POA: Diagnosis not present

## 2022-07-29 DIAGNOSIS — D631 Anemia in chronic kidney disease: Secondary | ICD-10-CM | POA: Diagnosis not present

## 2022-07-29 DIAGNOSIS — Z992 Dependence on renal dialysis: Secondary | ICD-10-CM | POA: Diagnosis not present

## 2022-07-29 DIAGNOSIS — Z4932 Encounter for adequacy testing for peritoneal dialysis: Secondary | ICD-10-CM | POA: Diagnosis not present

## 2022-07-29 DIAGNOSIS — N186 End stage renal disease: Secondary | ICD-10-CM | POA: Diagnosis not present

## 2022-07-29 DIAGNOSIS — N2581 Secondary hyperparathyroidism of renal origin: Secondary | ICD-10-CM | POA: Diagnosis not present

## 2022-07-30 DIAGNOSIS — N2581 Secondary hyperparathyroidism of renal origin: Secondary | ICD-10-CM | POA: Diagnosis not present

## 2022-07-30 DIAGNOSIS — D631 Anemia in chronic kidney disease: Secondary | ICD-10-CM | POA: Diagnosis not present

## 2022-07-30 DIAGNOSIS — Z992 Dependence on renal dialysis: Secondary | ICD-10-CM | POA: Diagnosis not present

## 2022-07-30 DIAGNOSIS — Z4932 Encounter for adequacy testing for peritoneal dialysis: Secondary | ICD-10-CM | POA: Diagnosis not present

## 2022-07-30 DIAGNOSIS — N186 End stage renal disease: Secondary | ICD-10-CM | POA: Diagnosis not present

## 2022-07-31 DIAGNOSIS — N2581 Secondary hyperparathyroidism of renal origin: Secondary | ICD-10-CM | POA: Diagnosis not present

## 2022-07-31 DIAGNOSIS — D631 Anemia in chronic kidney disease: Secondary | ICD-10-CM | POA: Diagnosis not present

## 2022-07-31 DIAGNOSIS — N186 End stage renal disease: Secondary | ICD-10-CM | POA: Diagnosis not present

## 2022-07-31 DIAGNOSIS — Z4932 Encounter for adequacy testing for peritoneal dialysis: Secondary | ICD-10-CM | POA: Diagnosis not present

## 2022-07-31 DIAGNOSIS — Z992 Dependence on renal dialysis: Secondary | ICD-10-CM | POA: Diagnosis not present

## 2022-08-01 DIAGNOSIS — Z992 Dependence on renal dialysis: Secondary | ICD-10-CM | POA: Diagnosis not present

## 2022-08-01 DIAGNOSIS — N2581 Secondary hyperparathyroidism of renal origin: Secondary | ICD-10-CM | POA: Diagnosis not present

## 2022-08-01 DIAGNOSIS — D631 Anemia in chronic kidney disease: Secondary | ICD-10-CM | POA: Diagnosis not present

## 2022-08-01 DIAGNOSIS — N186 End stage renal disease: Secondary | ICD-10-CM | POA: Diagnosis not present

## 2022-08-01 DIAGNOSIS — Z4932 Encounter for adequacy testing for peritoneal dialysis: Secondary | ICD-10-CM | POA: Diagnosis not present

## 2022-08-02 DIAGNOSIS — N2581 Secondary hyperparathyroidism of renal origin: Secondary | ICD-10-CM | POA: Diagnosis not present

## 2022-08-02 DIAGNOSIS — N186 End stage renal disease: Secondary | ICD-10-CM | POA: Diagnosis not present

## 2022-08-02 DIAGNOSIS — Z4932 Encounter for adequacy testing for peritoneal dialysis: Secondary | ICD-10-CM | POA: Diagnosis not present

## 2022-08-02 DIAGNOSIS — Z992 Dependence on renal dialysis: Secondary | ICD-10-CM | POA: Diagnosis not present

## 2022-08-02 DIAGNOSIS — D631 Anemia in chronic kidney disease: Secondary | ICD-10-CM | POA: Diagnosis not present

## 2022-08-03 DIAGNOSIS — D631 Anemia in chronic kidney disease: Secondary | ICD-10-CM | POA: Diagnosis not present

## 2022-08-03 DIAGNOSIS — N186 End stage renal disease: Secondary | ICD-10-CM | POA: Diagnosis not present

## 2022-08-03 DIAGNOSIS — N2581 Secondary hyperparathyroidism of renal origin: Secondary | ICD-10-CM | POA: Diagnosis not present

## 2022-08-03 DIAGNOSIS — Z4932 Encounter for adequacy testing for peritoneal dialysis: Secondary | ICD-10-CM | POA: Diagnosis not present

## 2022-08-03 DIAGNOSIS — Z992 Dependence on renal dialysis: Secondary | ICD-10-CM | POA: Diagnosis not present

## 2022-08-04 DIAGNOSIS — I739 Peripheral vascular disease, unspecified: Secondary | ICD-10-CM | POA: Diagnosis not present

## 2022-08-04 DIAGNOSIS — L84 Corns and callosities: Secondary | ICD-10-CM | POA: Diagnosis not present

## 2022-08-04 DIAGNOSIS — N2581 Secondary hyperparathyroidism of renal origin: Secondary | ICD-10-CM | POA: Diagnosis not present

## 2022-08-04 DIAGNOSIS — Z992 Dependence on renal dialysis: Secondary | ICD-10-CM | POA: Diagnosis not present

## 2022-08-04 DIAGNOSIS — I872 Venous insufficiency (chronic) (peripheral): Secondary | ICD-10-CM | POA: Diagnosis not present

## 2022-08-04 DIAGNOSIS — Z4932 Encounter for adequacy testing for peritoneal dialysis: Secondary | ICD-10-CM | POA: Diagnosis not present

## 2022-08-04 DIAGNOSIS — N186 End stage renal disease: Secondary | ICD-10-CM | POA: Diagnosis not present

## 2022-08-04 DIAGNOSIS — D631 Anemia in chronic kidney disease: Secondary | ICD-10-CM | POA: Diagnosis not present

## 2022-08-05 DIAGNOSIS — Z992 Dependence on renal dialysis: Secondary | ICD-10-CM | POA: Diagnosis not present

## 2022-08-05 DIAGNOSIS — N186 End stage renal disease: Secondary | ICD-10-CM | POA: Diagnosis not present

## 2022-08-05 DIAGNOSIS — N2581 Secondary hyperparathyroidism of renal origin: Secondary | ICD-10-CM | POA: Diagnosis not present

## 2022-08-05 DIAGNOSIS — D631 Anemia in chronic kidney disease: Secondary | ICD-10-CM | POA: Diagnosis not present

## 2022-08-05 DIAGNOSIS — Z4932 Encounter for adequacy testing for peritoneal dialysis: Secondary | ICD-10-CM | POA: Diagnosis not present

## 2022-08-06 DIAGNOSIS — D631 Anemia in chronic kidney disease: Secondary | ICD-10-CM | POA: Diagnosis not present

## 2022-08-06 DIAGNOSIS — N2581 Secondary hyperparathyroidism of renal origin: Secondary | ICD-10-CM | POA: Diagnosis not present

## 2022-08-06 DIAGNOSIS — Z4932 Encounter for adequacy testing for peritoneal dialysis: Secondary | ICD-10-CM | POA: Diagnosis not present

## 2022-08-06 DIAGNOSIS — N186 End stage renal disease: Secondary | ICD-10-CM | POA: Diagnosis not present

## 2022-08-06 DIAGNOSIS — Z992 Dependence on renal dialysis: Secondary | ICD-10-CM | POA: Diagnosis not present

## 2022-08-07 DIAGNOSIS — N186 End stage renal disease: Secondary | ICD-10-CM | POA: Diagnosis not present

## 2022-08-07 DIAGNOSIS — Z992 Dependence on renal dialysis: Secondary | ICD-10-CM | POA: Diagnosis not present

## 2022-08-07 DIAGNOSIS — D631 Anemia in chronic kidney disease: Secondary | ICD-10-CM | POA: Diagnosis not present

## 2022-08-07 DIAGNOSIS — N2581 Secondary hyperparathyroidism of renal origin: Secondary | ICD-10-CM | POA: Diagnosis not present

## 2022-08-07 DIAGNOSIS — Z4932 Encounter for adequacy testing for peritoneal dialysis: Secondary | ICD-10-CM | POA: Diagnosis not present

## 2022-08-08 DIAGNOSIS — Z23 Encounter for immunization: Secondary | ICD-10-CM | POA: Diagnosis not present

## 2022-08-08 DIAGNOSIS — D509 Iron deficiency anemia, unspecified: Secondary | ICD-10-CM | POA: Diagnosis not present

## 2022-08-08 DIAGNOSIS — N2581 Secondary hyperparathyroidism of renal origin: Secondary | ICD-10-CM | POA: Diagnosis not present

## 2022-08-08 DIAGNOSIS — Z992 Dependence on renal dialysis: Secondary | ICD-10-CM | POA: Diagnosis not present

## 2022-08-08 DIAGNOSIS — D631 Anemia in chronic kidney disease: Secondary | ICD-10-CM | POA: Diagnosis not present

## 2022-08-08 DIAGNOSIS — N186 End stage renal disease: Secondary | ICD-10-CM | POA: Diagnosis not present

## 2022-08-08 DIAGNOSIS — E875 Hyperkalemia: Secondary | ICD-10-CM | POA: Diagnosis not present

## 2022-08-09 DIAGNOSIS — E875 Hyperkalemia: Secondary | ICD-10-CM | POA: Diagnosis not present

## 2022-08-09 DIAGNOSIS — D631 Anemia in chronic kidney disease: Secondary | ICD-10-CM | POA: Diagnosis not present

## 2022-08-09 DIAGNOSIS — Z23 Encounter for immunization: Secondary | ICD-10-CM | POA: Diagnosis not present

## 2022-08-09 DIAGNOSIS — N2581 Secondary hyperparathyroidism of renal origin: Secondary | ICD-10-CM | POA: Diagnosis not present

## 2022-08-09 DIAGNOSIS — N186 End stage renal disease: Secondary | ICD-10-CM | POA: Diagnosis not present

## 2022-08-09 DIAGNOSIS — D509 Iron deficiency anemia, unspecified: Secondary | ICD-10-CM | POA: Diagnosis not present

## 2022-08-09 DIAGNOSIS — Z992 Dependence on renal dialysis: Secondary | ICD-10-CM | POA: Diagnosis not present

## 2022-08-10 DIAGNOSIS — N186 End stage renal disease: Secondary | ICD-10-CM | POA: Diagnosis not present

## 2022-08-10 DIAGNOSIS — Z23 Encounter for immunization: Secondary | ICD-10-CM | POA: Diagnosis not present

## 2022-08-10 DIAGNOSIS — D509 Iron deficiency anemia, unspecified: Secondary | ICD-10-CM | POA: Diagnosis not present

## 2022-08-10 DIAGNOSIS — E875 Hyperkalemia: Secondary | ICD-10-CM | POA: Diagnosis not present

## 2022-08-10 DIAGNOSIS — D631 Anemia in chronic kidney disease: Secondary | ICD-10-CM | POA: Diagnosis not present

## 2022-08-10 DIAGNOSIS — N2581 Secondary hyperparathyroidism of renal origin: Secondary | ICD-10-CM | POA: Diagnosis not present

## 2022-08-10 DIAGNOSIS — Z992 Dependence on renal dialysis: Secondary | ICD-10-CM | POA: Diagnosis not present

## 2022-08-11 DIAGNOSIS — N186 End stage renal disease: Secondary | ICD-10-CM | POA: Diagnosis not present

## 2022-08-11 DIAGNOSIS — N2581 Secondary hyperparathyroidism of renal origin: Secondary | ICD-10-CM | POA: Diagnosis not present

## 2022-08-11 DIAGNOSIS — E875 Hyperkalemia: Secondary | ICD-10-CM | POA: Diagnosis not present

## 2022-08-11 DIAGNOSIS — Z23 Encounter for immunization: Secondary | ICD-10-CM | POA: Diagnosis not present

## 2022-08-11 DIAGNOSIS — D509 Iron deficiency anemia, unspecified: Secondary | ICD-10-CM | POA: Diagnosis not present

## 2022-08-11 DIAGNOSIS — D631 Anemia in chronic kidney disease: Secondary | ICD-10-CM | POA: Diagnosis not present

## 2022-08-11 DIAGNOSIS — Z992 Dependence on renal dialysis: Secondary | ICD-10-CM | POA: Diagnosis not present

## 2022-08-12 DIAGNOSIS — D631 Anemia in chronic kidney disease: Secondary | ICD-10-CM | POA: Diagnosis not present

## 2022-08-12 DIAGNOSIS — Z992 Dependence on renal dialysis: Secondary | ICD-10-CM | POA: Diagnosis not present

## 2022-08-12 DIAGNOSIS — N186 End stage renal disease: Secondary | ICD-10-CM | POA: Diagnosis not present

## 2022-08-12 DIAGNOSIS — Z23 Encounter for immunization: Secondary | ICD-10-CM | POA: Diagnosis not present

## 2022-08-12 DIAGNOSIS — E875 Hyperkalemia: Secondary | ICD-10-CM | POA: Diagnosis not present

## 2022-08-12 DIAGNOSIS — D509 Iron deficiency anemia, unspecified: Secondary | ICD-10-CM | POA: Diagnosis not present

## 2022-08-12 DIAGNOSIS — N2581 Secondary hyperparathyroidism of renal origin: Secondary | ICD-10-CM | POA: Diagnosis not present

## 2022-08-13 DIAGNOSIS — D631 Anemia in chronic kidney disease: Secondary | ICD-10-CM | POA: Diagnosis not present

## 2022-08-13 DIAGNOSIS — D509 Iron deficiency anemia, unspecified: Secondary | ICD-10-CM | POA: Diagnosis not present

## 2022-08-13 DIAGNOSIS — E875 Hyperkalemia: Secondary | ICD-10-CM | POA: Diagnosis not present

## 2022-08-13 DIAGNOSIS — N2581 Secondary hyperparathyroidism of renal origin: Secondary | ICD-10-CM | POA: Diagnosis not present

## 2022-08-13 DIAGNOSIS — N186 End stage renal disease: Secondary | ICD-10-CM | POA: Diagnosis not present

## 2022-08-13 DIAGNOSIS — Z992 Dependence on renal dialysis: Secondary | ICD-10-CM | POA: Diagnosis not present

## 2022-08-13 DIAGNOSIS — Z23 Encounter for immunization: Secondary | ICD-10-CM | POA: Diagnosis not present

## 2022-08-14 DIAGNOSIS — D631 Anemia in chronic kidney disease: Secondary | ICD-10-CM | POA: Diagnosis not present

## 2022-08-14 DIAGNOSIS — N2581 Secondary hyperparathyroidism of renal origin: Secondary | ICD-10-CM | POA: Diagnosis not present

## 2022-08-14 DIAGNOSIS — N186 End stage renal disease: Secondary | ICD-10-CM | POA: Diagnosis not present

## 2022-08-14 DIAGNOSIS — E875 Hyperkalemia: Secondary | ICD-10-CM | POA: Diagnosis not present

## 2022-08-14 DIAGNOSIS — D509 Iron deficiency anemia, unspecified: Secondary | ICD-10-CM | POA: Diagnosis not present

## 2022-08-14 DIAGNOSIS — Z992 Dependence on renal dialysis: Secondary | ICD-10-CM | POA: Diagnosis not present

## 2022-08-14 DIAGNOSIS — Z23 Encounter for immunization: Secondary | ICD-10-CM | POA: Diagnosis not present

## 2022-08-15 DIAGNOSIS — D631 Anemia in chronic kidney disease: Secondary | ICD-10-CM | POA: Diagnosis not present

## 2022-08-15 DIAGNOSIS — Z992 Dependence on renal dialysis: Secondary | ICD-10-CM | POA: Diagnosis not present

## 2022-08-15 DIAGNOSIS — D509 Iron deficiency anemia, unspecified: Secondary | ICD-10-CM | POA: Diagnosis not present

## 2022-08-15 DIAGNOSIS — Z23 Encounter for immunization: Secondary | ICD-10-CM | POA: Diagnosis not present

## 2022-08-15 DIAGNOSIS — N186 End stage renal disease: Secondary | ICD-10-CM | POA: Diagnosis not present

## 2022-08-15 DIAGNOSIS — E875 Hyperkalemia: Secondary | ICD-10-CM | POA: Diagnosis not present

## 2022-08-15 DIAGNOSIS — N2581 Secondary hyperparathyroidism of renal origin: Secondary | ICD-10-CM | POA: Diagnosis not present

## 2022-08-16 DIAGNOSIS — D509 Iron deficiency anemia, unspecified: Secondary | ICD-10-CM | POA: Diagnosis not present

## 2022-08-16 DIAGNOSIS — Z992 Dependence on renal dialysis: Secondary | ICD-10-CM | POA: Diagnosis not present

## 2022-08-16 DIAGNOSIS — N2581 Secondary hyperparathyroidism of renal origin: Secondary | ICD-10-CM | POA: Diagnosis not present

## 2022-08-16 DIAGNOSIS — Z23 Encounter for immunization: Secondary | ICD-10-CM | POA: Diagnosis not present

## 2022-08-16 DIAGNOSIS — D631 Anemia in chronic kidney disease: Secondary | ICD-10-CM | POA: Diagnosis not present

## 2022-08-16 DIAGNOSIS — E875 Hyperkalemia: Secondary | ICD-10-CM | POA: Diagnosis not present

## 2022-08-16 DIAGNOSIS — N186 End stage renal disease: Secondary | ICD-10-CM | POA: Diagnosis not present

## 2022-08-17 DIAGNOSIS — D509 Iron deficiency anemia, unspecified: Secondary | ICD-10-CM | POA: Diagnosis not present

## 2022-08-17 DIAGNOSIS — N186 End stage renal disease: Secondary | ICD-10-CM | POA: Diagnosis not present

## 2022-08-17 DIAGNOSIS — Z23 Encounter for immunization: Secondary | ICD-10-CM | POA: Diagnosis not present

## 2022-08-17 DIAGNOSIS — N2581 Secondary hyperparathyroidism of renal origin: Secondary | ICD-10-CM | POA: Diagnosis not present

## 2022-08-17 DIAGNOSIS — Z992 Dependence on renal dialysis: Secondary | ICD-10-CM | POA: Diagnosis not present

## 2022-08-17 DIAGNOSIS — E875 Hyperkalemia: Secondary | ICD-10-CM | POA: Diagnosis not present

## 2022-08-17 DIAGNOSIS — D631 Anemia in chronic kidney disease: Secondary | ICD-10-CM | POA: Diagnosis not present

## 2022-08-18 DIAGNOSIS — E875 Hyperkalemia: Secondary | ICD-10-CM | POA: Diagnosis not present

## 2022-08-18 DIAGNOSIS — D509 Iron deficiency anemia, unspecified: Secondary | ICD-10-CM | POA: Diagnosis not present

## 2022-08-18 DIAGNOSIS — N2581 Secondary hyperparathyroidism of renal origin: Secondary | ICD-10-CM | POA: Diagnosis not present

## 2022-08-18 DIAGNOSIS — N186 End stage renal disease: Secondary | ICD-10-CM | POA: Diagnosis not present

## 2022-08-18 DIAGNOSIS — Z992 Dependence on renal dialysis: Secondary | ICD-10-CM | POA: Diagnosis not present

## 2022-08-18 DIAGNOSIS — D631 Anemia in chronic kidney disease: Secondary | ICD-10-CM | POA: Diagnosis not present

## 2022-08-18 DIAGNOSIS — Z23 Encounter for immunization: Secondary | ICD-10-CM | POA: Diagnosis not present

## 2022-08-19 DIAGNOSIS — N186 End stage renal disease: Secondary | ICD-10-CM | POA: Diagnosis not present

## 2022-08-19 DIAGNOSIS — N2581 Secondary hyperparathyroidism of renal origin: Secondary | ICD-10-CM | POA: Diagnosis not present

## 2022-08-19 DIAGNOSIS — E875 Hyperkalemia: Secondary | ICD-10-CM | POA: Diagnosis not present

## 2022-08-19 DIAGNOSIS — Z992 Dependence on renal dialysis: Secondary | ICD-10-CM | POA: Diagnosis not present

## 2022-08-19 DIAGNOSIS — D631 Anemia in chronic kidney disease: Secondary | ICD-10-CM | POA: Diagnosis not present

## 2022-08-19 DIAGNOSIS — Z23 Encounter for immunization: Secondary | ICD-10-CM | POA: Diagnosis not present

## 2022-08-19 DIAGNOSIS — D509 Iron deficiency anemia, unspecified: Secondary | ICD-10-CM | POA: Diagnosis not present

## 2022-08-20 DIAGNOSIS — E875 Hyperkalemia: Secondary | ICD-10-CM | POA: Diagnosis not present

## 2022-08-20 DIAGNOSIS — D631 Anemia in chronic kidney disease: Secondary | ICD-10-CM | POA: Diagnosis not present

## 2022-08-20 DIAGNOSIS — D509 Iron deficiency anemia, unspecified: Secondary | ICD-10-CM | POA: Diagnosis not present

## 2022-08-20 DIAGNOSIS — N2581 Secondary hyperparathyroidism of renal origin: Secondary | ICD-10-CM | POA: Diagnosis not present

## 2022-08-20 DIAGNOSIS — N186 End stage renal disease: Secondary | ICD-10-CM | POA: Diagnosis not present

## 2022-08-20 DIAGNOSIS — Z992 Dependence on renal dialysis: Secondary | ICD-10-CM | POA: Diagnosis not present

## 2022-08-20 DIAGNOSIS — Z23 Encounter for immunization: Secondary | ICD-10-CM | POA: Diagnosis not present

## 2022-08-21 DIAGNOSIS — N2581 Secondary hyperparathyroidism of renal origin: Secondary | ICD-10-CM | POA: Diagnosis not present

## 2022-08-21 DIAGNOSIS — E875 Hyperkalemia: Secondary | ICD-10-CM | POA: Diagnosis not present

## 2022-08-21 DIAGNOSIS — D509 Iron deficiency anemia, unspecified: Secondary | ICD-10-CM | POA: Diagnosis not present

## 2022-08-21 DIAGNOSIS — Z992 Dependence on renal dialysis: Secondary | ICD-10-CM | POA: Diagnosis not present

## 2022-08-21 DIAGNOSIS — D631 Anemia in chronic kidney disease: Secondary | ICD-10-CM | POA: Diagnosis not present

## 2022-08-21 DIAGNOSIS — N186 End stage renal disease: Secondary | ICD-10-CM | POA: Diagnosis not present

## 2022-08-21 DIAGNOSIS — Z23 Encounter for immunization: Secondary | ICD-10-CM | POA: Diagnosis not present

## 2022-08-22 DIAGNOSIS — Z23 Encounter for immunization: Secondary | ICD-10-CM | POA: Diagnosis not present

## 2022-08-22 DIAGNOSIS — D631 Anemia in chronic kidney disease: Secondary | ICD-10-CM | POA: Diagnosis not present

## 2022-08-22 DIAGNOSIS — E875 Hyperkalemia: Secondary | ICD-10-CM | POA: Diagnosis not present

## 2022-08-22 DIAGNOSIS — Z992 Dependence on renal dialysis: Secondary | ICD-10-CM | POA: Diagnosis not present

## 2022-08-22 DIAGNOSIS — N186 End stage renal disease: Secondary | ICD-10-CM | POA: Diagnosis not present

## 2022-08-22 DIAGNOSIS — N2581 Secondary hyperparathyroidism of renal origin: Secondary | ICD-10-CM | POA: Diagnosis not present

## 2022-08-22 DIAGNOSIS — D509 Iron deficiency anemia, unspecified: Secondary | ICD-10-CM | POA: Diagnosis not present

## 2022-08-23 DIAGNOSIS — N2581 Secondary hyperparathyroidism of renal origin: Secondary | ICD-10-CM | POA: Diagnosis not present

## 2022-08-23 DIAGNOSIS — D509 Iron deficiency anemia, unspecified: Secondary | ICD-10-CM | POA: Diagnosis not present

## 2022-08-23 DIAGNOSIS — Z992 Dependence on renal dialysis: Secondary | ICD-10-CM | POA: Diagnosis not present

## 2022-08-23 DIAGNOSIS — D631 Anemia in chronic kidney disease: Secondary | ICD-10-CM | POA: Diagnosis not present

## 2022-08-23 DIAGNOSIS — E875 Hyperkalemia: Secondary | ICD-10-CM | POA: Diagnosis not present

## 2022-08-23 DIAGNOSIS — Z23 Encounter for immunization: Secondary | ICD-10-CM | POA: Diagnosis not present

## 2022-08-23 DIAGNOSIS — N186 End stage renal disease: Secondary | ICD-10-CM | POA: Diagnosis not present

## 2022-08-24 DIAGNOSIS — N2581 Secondary hyperparathyroidism of renal origin: Secondary | ICD-10-CM | POA: Diagnosis not present

## 2022-08-24 DIAGNOSIS — Z992 Dependence on renal dialysis: Secondary | ICD-10-CM | POA: Diagnosis not present

## 2022-08-24 DIAGNOSIS — E875 Hyperkalemia: Secondary | ICD-10-CM | POA: Diagnosis not present

## 2022-08-24 DIAGNOSIS — D509 Iron deficiency anemia, unspecified: Secondary | ICD-10-CM | POA: Diagnosis not present

## 2022-08-24 DIAGNOSIS — D631 Anemia in chronic kidney disease: Secondary | ICD-10-CM | POA: Diagnosis not present

## 2022-08-24 DIAGNOSIS — N186 End stage renal disease: Secondary | ICD-10-CM | POA: Diagnosis not present

## 2022-08-24 DIAGNOSIS — Z23 Encounter for immunization: Secondary | ICD-10-CM | POA: Diagnosis not present

## 2022-08-25 DIAGNOSIS — D509 Iron deficiency anemia, unspecified: Secondary | ICD-10-CM | POA: Diagnosis not present

## 2022-08-25 DIAGNOSIS — N2581 Secondary hyperparathyroidism of renal origin: Secondary | ICD-10-CM | POA: Diagnosis not present

## 2022-08-25 DIAGNOSIS — E875 Hyperkalemia: Secondary | ICD-10-CM | POA: Diagnosis not present

## 2022-08-25 DIAGNOSIS — N186 End stage renal disease: Secondary | ICD-10-CM | POA: Diagnosis not present

## 2022-08-25 DIAGNOSIS — Z23 Encounter for immunization: Secondary | ICD-10-CM | POA: Diagnosis not present

## 2022-08-25 DIAGNOSIS — D631 Anemia in chronic kidney disease: Secondary | ICD-10-CM | POA: Diagnosis not present

## 2022-08-25 DIAGNOSIS — Z992 Dependence on renal dialysis: Secondary | ICD-10-CM | POA: Diagnosis not present

## 2022-08-26 DIAGNOSIS — D509 Iron deficiency anemia, unspecified: Secondary | ICD-10-CM | POA: Diagnosis not present

## 2022-08-26 DIAGNOSIS — E875 Hyperkalemia: Secondary | ICD-10-CM | POA: Diagnosis not present

## 2022-08-26 DIAGNOSIS — N2581 Secondary hyperparathyroidism of renal origin: Secondary | ICD-10-CM | POA: Diagnosis not present

## 2022-08-26 DIAGNOSIS — Z992 Dependence on renal dialysis: Secondary | ICD-10-CM | POA: Diagnosis not present

## 2022-08-26 DIAGNOSIS — Z23 Encounter for immunization: Secondary | ICD-10-CM | POA: Diagnosis not present

## 2022-08-26 DIAGNOSIS — N186 End stage renal disease: Secondary | ICD-10-CM | POA: Diagnosis not present

## 2022-08-26 DIAGNOSIS — D631 Anemia in chronic kidney disease: Secondary | ICD-10-CM | POA: Diagnosis not present

## 2022-08-27 DIAGNOSIS — D509 Iron deficiency anemia, unspecified: Secondary | ICD-10-CM | POA: Diagnosis not present

## 2022-08-27 DIAGNOSIS — N2581 Secondary hyperparathyroidism of renal origin: Secondary | ICD-10-CM | POA: Diagnosis not present

## 2022-08-27 DIAGNOSIS — Z23 Encounter for immunization: Secondary | ICD-10-CM | POA: Diagnosis not present

## 2022-08-27 DIAGNOSIS — Z992 Dependence on renal dialysis: Secondary | ICD-10-CM | POA: Diagnosis not present

## 2022-08-27 DIAGNOSIS — D631 Anemia in chronic kidney disease: Secondary | ICD-10-CM | POA: Diagnosis not present

## 2022-08-27 DIAGNOSIS — E875 Hyperkalemia: Secondary | ICD-10-CM | POA: Diagnosis not present

## 2022-08-27 DIAGNOSIS — N186 End stage renal disease: Secondary | ICD-10-CM | POA: Diagnosis not present

## 2022-08-28 DIAGNOSIS — N2581 Secondary hyperparathyroidism of renal origin: Secondary | ICD-10-CM | POA: Diagnosis not present

## 2022-08-28 DIAGNOSIS — Z992 Dependence on renal dialysis: Secondary | ICD-10-CM | POA: Diagnosis not present

## 2022-08-28 DIAGNOSIS — Z23 Encounter for immunization: Secondary | ICD-10-CM | POA: Diagnosis not present

## 2022-08-28 DIAGNOSIS — D631 Anemia in chronic kidney disease: Secondary | ICD-10-CM | POA: Diagnosis not present

## 2022-08-28 DIAGNOSIS — E875 Hyperkalemia: Secondary | ICD-10-CM | POA: Diagnosis not present

## 2022-08-28 DIAGNOSIS — N186 End stage renal disease: Secondary | ICD-10-CM | POA: Diagnosis not present

## 2022-08-28 DIAGNOSIS — D509 Iron deficiency anemia, unspecified: Secondary | ICD-10-CM | POA: Diagnosis not present

## 2022-08-29 DIAGNOSIS — N2581 Secondary hyperparathyroidism of renal origin: Secondary | ICD-10-CM | POA: Diagnosis not present

## 2022-08-29 DIAGNOSIS — Z992 Dependence on renal dialysis: Secondary | ICD-10-CM | POA: Diagnosis not present

## 2022-08-29 DIAGNOSIS — Z23 Encounter for immunization: Secondary | ICD-10-CM | POA: Diagnosis not present

## 2022-08-29 DIAGNOSIS — N186 End stage renal disease: Secondary | ICD-10-CM | POA: Diagnosis not present

## 2022-08-29 DIAGNOSIS — E875 Hyperkalemia: Secondary | ICD-10-CM | POA: Diagnosis not present

## 2022-08-29 DIAGNOSIS — D631 Anemia in chronic kidney disease: Secondary | ICD-10-CM | POA: Diagnosis not present

## 2022-08-29 DIAGNOSIS — D509 Iron deficiency anemia, unspecified: Secondary | ICD-10-CM | POA: Diagnosis not present

## 2022-08-30 DIAGNOSIS — D631 Anemia in chronic kidney disease: Secondary | ICD-10-CM | POA: Diagnosis not present

## 2022-08-30 DIAGNOSIS — Z23 Encounter for immunization: Secondary | ICD-10-CM | POA: Diagnosis not present

## 2022-08-30 DIAGNOSIS — N186 End stage renal disease: Secondary | ICD-10-CM | POA: Diagnosis not present

## 2022-08-30 DIAGNOSIS — Z992 Dependence on renal dialysis: Secondary | ICD-10-CM | POA: Diagnosis not present

## 2022-08-30 DIAGNOSIS — D509 Iron deficiency anemia, unspecified: Secondary | ICD-10-CM | POA: Diagnosis not present

## 2022-08-30 DIAGNOSIS — N2581 Secondary hyperparathyroidism of renal origin: Secondary | ICD-10-CM | POA: Diagnosis not present

## 2022-08-30 DIAGNOSIS — E875 Hyperkalemia: Secondary | ICD-10-CM | POA: Diagnosis not present

## 2022-08-31 DIAGNOSIS — D509 Iron deficiency anemia, unspecified: Secondary | ICD-10-CM | POA: Diagnosis not present

## 2022-08-31 DIAGNOSIS — Z79899 Other long term (current) drug therapy: Secondary | ICD-10-CM | POA: Diagnosis not present

## 2022-08-31 DIAGNOSIS — N186 End stage renal disease: Secondary | ICD-10-CM | POA: Diagnosis not present

## 2022-08-31 DIAGNOSIS — E1065 Type 1 diabetes mellitus with hyperglycemia: Secondary | ICD-10-CM | POA: Diagnosis not present

## 2022-08-31 DIAGNOSIS — Z23 Encounter for immunization: Secondary | ICD-10-CM | POA: Diagnosis not present

## 2022-08-31 DIAGNOSIS — Z794 Long term (current) use of insulin: Secondary | ICD-10-CM | POA: Diagnosis not present

## 2022-08-31 DIAGNOSIS — I120 Hypertensive chronic kidney disease with stage 5 chronic kidney disease or end stage renal disease: Secondary | ICD-10-CM | POA: Diagnosis not present

## 2022-08-31 DIAGNOSIS — E1022 Type 1 diabetes mellitus with diabetic chronic kidney disease: Secondary | ICD-10-CM | POA: Diagnosis not present

## 2022-08-31 DIAGNOSIS — E10621 Type 1 diabetes mellitus with foot ulcer: Secondary | ICD-10-CM | POA: Diagnosis not present

## 2022-08-31 DIAGNOSIS — Z79624 Long term (current) use of inhibitors of nucleotide synthesis: Secondary | ICD-10-CM | POA: Diagnosis not present

## 2022-08-31 DIAGNOSIS — L97529 Non-pressure chronic ulcer of other part of left foot with unspecified severity: Secondary | ICD-10-CM | POA: Diagnosis not present

## 2022-08-31 DIAGNOSIS — D631 Anemia in chronic kidney disease: Secondary | ICD-10-CM | POA: Diagnosis not present

## 2022-08-31 DIAGNOSIS — E1042 Type 1 diabetes mellitus with diabetic polyneuropathy: Secondary | ICD-10-CM | POA: Diagnosis not present

## 2022-08-31 DIAGNOSIS — E875 Hyperkalemia: Secondary | ICD-10-CM | POA: Diagnosis not present

## 2022-08-31 DIAGNOSIS — Z992 Dependence on renal dialysis: Secondary | ICD-10-CM | POA: Diagnosis not present

## 2022-08-31 DIAGNOSIS — N2581 Secondary hyperparathyroidism of renal origin: Secondary | ICD-10-CM | POA: Diagnosis not present

## 2022-09-01 DIAGNOSIS — Z992 Dependence on renal dialysis: Secondary | ICD-10-CM | POA: Diagnosis not present

## 2022-09-01 DIAGNOSIS — J387 Other diseases of larynx: Secondary | ICD-10-CM | POA: Diagnosis not present

## 2022-09-01 DIAGNOSIS — D631 Anemia in chronic kidney disease: Secondary | ICD-10-CM | POA: Diagnosis not present

## 2022-09-01 DIAGNOSIS — N2581 Secondary hyperparathyroidism of renal origin: Secondary | ICD-10-CM | POA: Diagnosis not present

## 2022-09-01 DIAGNOSIS — R0982 Postnasal drip: Secondary | ICD-10-CM | POA: Diagnosis not present

## 2022-09-01 DIAGNOSIS — R053 Chronic cough: Secondary | ICD-10-CM | POA: Diagnosis not present

## 2022-09-01 DIAGNOSIS — J382 Nodules of vocal cords: Secondary | ICD-10-CM | POA: Diagnosis not present

## 2022-09-01 DIAGNOSIS — D509 Iron deficiency anemia, unspecified: Secondary | ICD-10-CM | POA: Diagnosis not present

## 2022-09-01 DIAGNOSIS — N186 End stage renal disease: Secondary | ICD-10-CM | POA: Diagnosis not present

## 2022-09-01 DIAGNOSIS — Z23 Encounter for immunization: Secondary | ICD-10-CM | POA: Diagnosis not present

## 2022-09-01 DIAGNOSIS — E875 Hyperkalemia: Secondary | ICD-10-CM | POA: Diagnosis not present

## 2022-09-01 DIAGNOSIS — J302 Other seasonal allergic rhinitis: Secondary | ICD-10-CM | POA: Diagnosis not present

## 2022-09-01 DIAGNOSIS — J301 Allergic rhinitis due to pollen: Secondary | ICD-10-CM | POA: Diagnosis not present

## 2022-09-02 DIAGNOSIS — E875 Hyperkalemia: Secondary | ICD-10-CM | POA: Diagnosis not present

## 2022-09-02 DIAGNOSIS — D631 Anemia in chronic kidney disease: Secondary | ICD-10-CM | POA: Diagnosis not present

## 2022-09-02 DIAGNOSIS — Z992 Dependence on renal dialysis: Secondary | ICD-10-CM | POA: Diagnosis not present

## 2022-09-02 DIAGNOSIS — D509 Iron deficiency anemia, unspecified: Secondary | ICD-10-CM | POA: Diagnosis not present

## 2022-09-02 DIAGNOSIS — N2581 Secondary hyperparathyroidism of renal origin: Secondary | ICD-10-CM | POA: Diagnosis not present

## 2022-09-02 DIAGNOSIS — N186 End stage renal disease: Secondary | ICD-10-CM | POA: Diagnosis not present

## 2022-09-02 DIAGNOSIS — Z23 Encounter for immunization: Secondary | ICD-10-CM | POA: Diagnosis not present

## 2022-09-03 DIAGNOSIS — Z992 Dependence on renal dialysis: Secondary | ICD-10-CM | POA: Diagnosis not present

## 2022-09-03 DIAGNOSIS — E875 Hyperkalemia: Secondary | ICD-10-CM | POA: Diagnosis not present

## 2022-09-03 DIAGNOSIS — N2581 Secondary hyperparathyroidism of renal origin: Secondary | ICD-10-CM | POA: Diagnosis not present

## 2022-09-03 DIAGNOSIS — D631 Anemia in chronic kidney disease: Secondary | ICD-10-CM | POA: Diagnosis not present

## 2022-09-03 DIAGNOSIS — Z23 Encounter for immunization: Secondary | ICD-10-CM | POA: Diagnosis not present

## 2022-09-03 DIAGNOSIS — D509 Iron deficiency anemia, unspecified: Secondary | ICD-10-CM | POA: Diagnosis not present

## 2022-09-03 DIAGNOSIS — N186 End stage renal disease: Secondary | ICD-10-CM | POA: Diagnosis not present

## 2022-09-04 DIAGNOSIS — N186 End stage renal disease: Secondary | ICD-10-CM | POA: Diagnosis not present

## 2022-09-04 DIAGNOSIS — D509 Iron deficiency anemia, unspecified: Secondary | ICD-10-CM | POA: Diagnosis not present

## 2022-09-04 DIAGNOSIS — Z992 Dependence on renal dialysis: Secondary | ICD-10-CM | POA: Diagnosis not present

## 2022-09-04 DIAGNOSIS — D631 Anemia in chronic kidney disease: Secondary | ICD-10-CM | POA: Diagnosis not present

## 2022-09-04 DIAGNOSIS — Z23 Encounter for immunization: Secondary | ICD-10-CM | POA: Diagnosis not present

## 2022-09-04 DIAGNOSIS — E875 Hyperkalemia: Secondary | ICD-10-CM | POA: Diagnosis not present

## 2022-09-04 DIAGNOSIS — N2581 Secondary hyperparathyroidism of renal origin: Secondary | ICD-10-CM | POA: Diagnosis not present

## 2022-09-05 DIAGNOSIS — N186 End stage renal disease: Secondary | ICD-10-CM | POA: Diagnosis not present

## 2022-09-05 DIAGNOSIS — Z23 Encounter for immunization: Secondary | ICD-10-CM | POA: Diagnosis not present

## 2022-09-05 DIAGNOSIS — N2581 Secondary hyperparathyroidism of renal origin: Secondary | ICD-10-CM | POA: Diagnosis not present

## 2022-09-05 DIAGNOSIS — D509 Iron deficiency anemia, unspecified: Secondary | ICD-10-CM | POA: Diagnosis not present

## 2022-09-05 DIAGNOSIS — Z992 Dependence on renal dialysis: Secondary | ICD-10-CM | POA: Diagnosis not present

## 2022-09-05 DIAGNOSIS — E875 Hyperkalemia: Secondary | ICD-10-CM | POA: Diagnosis not present

## 2022-09-05 DIAGNOSIS — D631 Anemia in chronic kidney disease: Secondary | ICD-10-CM | POA: Diagnosis not present

## 2022-09-06 ENCOUNTER — Encounter (INDEPENDENT_AMBULATORY_CARE_PROVIDER_SITE_OTHER): Payer: Self-pay

## 2022-09-06 DIAGNOSIS — D631 Anemia in chronic kidney disease: Secondary | ICD-10-CM | POA: Diagnosis not present

## 2022-09-06 DIAGNOSIS — E875 Hyperkalemia: Secondary | ICD-10-CM | POA: Diagnosis not present

## 2022-09-06 DIAGNOSIS — Z23 Encounter for immunization: Secondary | ICD-10-CM | POA: Diagnosis not present

## 2022-09-06 DIAGNOSIS — Z992 Dependence on renal dialysis: Secondary | ICD-10-CM | POA: Diagnosis not present

## 2022-09-06 DIAGNOSIS — G4733 Obstructive sleep apnea (adult) (pediatric): Secondary | ICD-10-CM | POA: Diagnosis not present

## 2022-09-06 DIAGNOSIS — N2581 Secondary hyperparathyroidism of renal origin: Secondary | ICD-10-CM | POA: Diagnosis not present

## 2022-09-06 DIAGNOSIS — D509 Iron deficiency anemia, unspecified: Secondary | ICD-10-CM | POA: Diagnosis not present

## 2022-09-06 DIAGNOSIS — N186 End stage renal disease: Secondary | ICD-10-CM | POA: Diagnosis not present

## 2022-09-07 DIAGNOSIS — Z23 Encounter for immunization: Secondary | ICD-10-CM | POA: Diagnosis not present

## 2022-09-07 DIAGNOSIS — N2581 Secondary hyperparathyroidism of renal origin: Secondary | ICD-10-CM | POA: Diagnosis not present

## 2022-09-07 DIAGNOSIS — D631 Anemia in chronic kidney disease: Secondary | ICD-10-CM | POA: Diagnosis not present

## 2022-09-07 DIAGNOSIS — D509 Iron deficiency anemia, unspecified: Secondary | ICD-10-CM | POA: Diagnosis not present

## 2022-09-07 DIAGNOSIS — E875 Hyperkalemia: Secondary | ICD-10-CM | POA: Diagnosis not present

## 2022-09-07 DIAGNOSIS — E1122 Type 2 diabetes mellitus with diabetic chronic kidney disease: Secondary | ICD-10-CM | POA: Diagnosis not present

## 2022-09-07 DIAGNOSIS — Z992 Dependence on renal dialysis: Secondary | ICD-10-CM | POA: Diagnosis not present

## 2022-09-07 DIAGNOSIS — N186 End stage renal disease: Secondary | ICD-10-CM | POA: Diagnosis not present

## 2022-09-07 DIAGNOSIS — Z01818 Encounter for other preprocedural examination: Secondary | ICD-10-CM | POA: Diagnosis not present

## 2022-09-08 DIAGNOSIS — N186 End stage renal disease: Secondary | ICD-10-CM | POA: Diagnosis not present

## 2022-09-08 DIAGNOSIS — D631 Anemia in chronic kidney disease: Secondary | ICD-10-CM | POA: Diagnosis not present

## 2022-09-08 DIAGNOSIS — N2581 Secondary hyperparathyroidism of renal origin: Secondary | ICD-10-CM | POA: Diagnosis not present

## 2022-09-08 DIAGNOSIS — Z992 Dependence on renal dialysis: Secondary | ICD-10-CM | POA: Diagnosis not present

## 2022-09-09 DIAGNOSIS — D631 Anemia in chronic kidney disease: Secondary | ICD-10-CM | POA: Diagnosis not present

## 2022-09-09 DIAGNOSIS — Z992 Dependence on renal dialysis: Secondary | ICD-10-CM | POA: Diagnosis not present

## 2022-09-09 DIAGNOSIS — N186 End stage renal disease: Secondary | ICD-10-CM | POA: Diagnosis not present

## 2022-09-09 DIAGNOSIS — N2581 Secondary hyperparathyroidism of renal origin: Secondary | ICD-10-CM | POA: Diagnosis not present

## 2022-09-10 DIAGNOSIS — N186 End stage renal disease: Secondary | ICD-10-CM | POA: Diagnosis not present

## 2022-09-10 DIAGNOSIS — N2581 Secondary hyperparathyroidism of renal origin: Secondary | ICD-10-CM | POA: Diagnosis not present

## 2022-09-10 DIAGNOSIS — Z992 Dependence on renal dialysis: Secondary | ICD-10-CM | POA: Diagnosis not present

## 2022-09-10 DIAGNOSIS — D631 Anemia in chronic kidney disease: Secondary | ICD-10-CM | POA: Diagnosis not present

## 2022-09-11 DIAGNOSIS — D631 Anemia in chronic kidney disease: Secondary | ICD-10-CM | POA: Diagnosis not present

## 2022-09-11 DIAGNOSIS — N186 End stage renal disease: Secondary | ICD-10-CM | POA: Diagnosis not present

## 2022-09-11 DIAGNOSIS — Z992 Dependence on renal dialysis: Secondary | ICD-10-CM | POA: Diagnosis not present

## 2022-09-11 DIAGNOSIS — N2581 Secondary hyperparathyroidism of renal origin: Secondary | ICD-10-CM | POA: Diagnosis not present

## 2022-09-12 DIAGNOSIS — N186 End stage renal disease: Secondary | ICD-10-CM | POA: Diagnosis not present

## 2022-09-12 DIAGNOSIS — D631 Anemia in chronic kidney disease: Secondary | ICD-10-CM | POA: Diagnosis not present

## 2022-09-12 DIAGNOSIS — N2581 Secondary hyperparathyroidism of renal origin: Secondary | ICD-10-CM | POA: Diagnosis not present

## 2022-09-12 DIAGNOSIS — Z992 Dependence on renal dialysis: Secondary | ICD-10-CM | POA: Diagnosis not present

## 2022-09-13 DIAGNOSIS — Z992 Dependence on renal dialysis: Secondary | ICD-10-CM | POA: Diagnosis not present

## 2022-09-13 DIAGNOSIS — D631 Anemia in chronic kidney disease: Secondary | ICD-10-CM | POA: Diagnosis not present

## 2022-09-13 DIAGNOSIS — N2581 Secondary hyperparathyroidism of renal origin: Secondary | ICD-10-CM | POA: Diagnosis not present

## 2022-09-13 DIAGNOSIS — N186 End stage renal disease: Secondary | ICD-10-CM | POA: Diagnosis not present

## 2022-09-14 DIAGNOSIS — Z992 Dependence on renal dialysis: Secondary | ICD-10-CM | POA: Diagnosis not present

## 2022-09-14 DIAGNOSIS — D631 Anemia in chronic kidney disease: Secondary | ICD-10-CM | POA: Diagnosis not present

## 2022-09-14 DIAGNOSIS — N186 End stage renal disease: Secondary | ICD-10-CM | POA: Diagnosis not present

## 2022-09-14 DIAGNOSIS — N2581 Secondary hyperparathyroidism of renal origin: Secondary | ICD-10-CM | POA: Diagnosis not present

## 2022-09-15 DIAGNOSIS — N186 End stage renal disease: Secondary | ICD-10-CM | POA: Diagnosis not present

## 2022-09-15 DIAGNOSIS — N2581 Secondary hyperparathyroidism of renal origin: Secondary | ICD-10-CM | POA: Diagnosis not present

## 2022-09-15 DIAGNOSIS — D631 Anemia in chronic kidney disease: Secondary | ICD-10-CM | POA: Diagnosis not present

## 2022-09-15 DIAGNOSIS — I739 Peripheral vascular disease, unspecified: Secondary | ICD-10-CM | POA: Diagnosis not present

## 2022-09-15 DIAGNOSIS — L97522 Non-pressure chronic ulcer of other part of left foot with fat layer exposed: Secondary | ICD-10-CM | POA: Diagnosis not present

## 2022-09-15 DIAGNOSIS — Z992 Dependence on renal dialysis: Secondary | ICD-10-CM | POA: Diagnosis not present

## 2022-09-16 DIAGNOSIS — Z992 Dependence on renal dialysis: Secondary | ICD-10-CM | POA: Diagnosis not present

## 2022-09-16 DIAGNOSIS — D631 Anemia in chronic kidney disease: Secondary | ICD-10-CM | POA: Diagnosis not present

## 2022-09-16 DIAGNOSIS — N186 End stage renal disease: Secondary | ICD-10-CM | POA: Diagnosis not present

## 2022-09-16 DIAGNOSIS — N2581 Secondary hyperparathyroidism of renal origin: Secondary | ICD-10-CM | POA: Diagnosis not present

## 2022-09-17 DIAGNOSIS — N186 End stage renal disease: Secondary | ICD-10-CM | POA: Diagnosis not present

## 2022-09-17 DIAGNOSIS — Z992 Dependence on renal dialysis: Secondary | ICD-10-CM | POA: Diagnosis not present

## 2022-09-17 DIAGNOSIS — D631 Anemia in chronic kidney disease: Secondary | ICD-10-CM | POA: Diagnosis not present

## 2022-09-17 DIAGNOSIS — N2581 Secondary hyperparathyroidism of renal origin: Secondary | ICD-10-CM | POA: Diagnosis not present

## 2022-09-18 DIAGNOSIS — D631 Anemia in chronic kidney disease: Secondary | ICD-10-CM | POA: Diagnosis not present

## 2022-09-18 DIAGNOSIS — N2581 Secondary hyperparathyroidism of renal origin: Secondary | ICD-10-CM | POA: Diagnosis not present

## 2022-09-18 DIAGNOSIS — N186 End stage renal disease: Secondary | ICD-10-CM | POA: Diagnosis not present

## 2022-09-18 DIAGNOSIS — Z992 Dependence on renal dialysis: Secondary | ICD-10-CM | POA: Diagnosis not present

## 2022-09-19 DIAGNOSIS — D631 Anemia in chronic kidney disease: Secondary | ICD-10-CM | POA: Diagnosis not present

## 2022-09-19 DIAGNOSIS — N186 End stage renal disease: Secondary | ICD-10-CM | POA: Diagnosis not present

## 2022-09-19 DIAGNOSIS — N2581 Secondary hyperparathyroidism of renal origin: Secondary | ICD-10-CM | POA: Diagnosis not present

## 2022-09-19 DIAGNOSIS — Z992 Dependence on renal dialysis: Secondary | ICD-10-CM | POA: Diagnosis not present

## 2022-09-20 DIAGNOSIS — N2581 Secondary hyperparathyroidism of renal origin: Secondary | ICD-10-CM | POA: Diagnosis not present

## 2022-09-20 DIAGNOSIS — Z992 Dependence on renal dialysis: Secondary | ICD-10-CM | POA: Diagnosis not present

## 2022-09-20 DIAGNOSIS — N186 End stage renal disease: Secondary | ICD-10-CM | POA: Diagnosis not present

## 2022-09-20 DIAGNOSIS — D631 Anemia in chronic kidney disease: Secondary | ICD-10-CM | POA: Diagnosis not present

## 2022-09-21 DIAGNOSIS — N186 End stage renal disease: Secondary | ICD-10-CM | POA: Diagnosis not present

## 2022-09-21 DIAGNOSIS — Z992 Dependence on renal dialysis: Secondary | ICD-10-CM | POA: Diagnosis not present

## 2022-09-21 DIAGNOSIS — D631 Anemia in chronic kidney disease: Secondary | ICD-10-CM | POA: Diagnosis not present

## 2022-09-21 DIAGNOSIS — N2581 Secondary hyperparathyroidism of renal origin: Secondary | ICD-10-CM | POA: Diagnosis not present

## 2022-09-22 DIAGNOSIS — Z992 Dependence on renal dialysis: Secondary | ICD-10-CM | POA: Diagnosis not present

## 2022-09-22 DIAGNOSIS — N186 End stage renal disease: Secondary | ICD-10-CM | POA: Diagnosis not present

## 2022-09-22 DIAGNOSIS — N2581 Secondary hyperparathyroidism of renal origin: Secondary | ICD-10-CM | POA: Diagnosis not present

## 2022-09-22 DIAGNOSIS — D631 Anemia in chronic kidney disease: Secondary | ICD-10-CM | POA: Diagnosis not present

## 2022-09-23 DIAGNOSIS — N2581 Secondary hyperparathyroidism of renal origin: Secondary | ICD-10-CM | POA: Diagnosis not present

## 2022-09-23 DIAGNOSIS — D631 Anemia in chronic kidney disease: Secondary | ICD-10-CM | POA: Diagnosis not present

## 2022-09-23 DIAGNOSIS — Z992 Dependence on renal dialysis: Secondary | ICD-10-CM | POA: Diagnosis not present

## 2022-09-23 DIAGNOSIS — N186 End stage renal disease: Secondary | ICD-10-CM | POA: Diagnosis not present

## 2022-09-24 DIAGNOSIS — N2581 Secondary hyperparathyroidism of renal origin: Secondary | ICD-10-CM | POA: Diagnosis not present

## 2022-09-24 DIAGNOSIS — Z992 Dependence on renal dialysis: Secondary | ICD-10-CM | POA: Diagnosis not present

## 2022-09-24 DIAGNOSIS — D631 Anemia in chronic kidney disease: Secondary | ICD-10-CM | POA: Diagnosis not present

## 2022-09-24 DIAGNOSIS — N186 End stage renal disease: Secondary | ICD-10-CM | POA: Diagnosis not present

## 2022-09-25 DIAGNOSIS — D631 Anemia in chronic kidney disease: Secondary | ICD-10-CM | POA: Diagnosis not present

## 2022-09-25 DIAGNOSIS — N186 End stage renal disease: Secondary | ICD-10-CM | POA: Diagnosis not present

## 2022-09-25 DIAGNOSIS — N2581 Secondary hyperparathyroidism of renal origin: Secondary | ICD-10-CM | POA: Diagnosis not present

## 2022-09-25 DIAGNOSIS — Z992 Dependence on renal dialysis: Secondary | ICD-10-CM | POA: Diagnosis not present

## 2022-09-26 DIAGNOSIS — N2581 Secondary hyperparathyroidism of renal origin: Secondary | ICD-10-CM | POA: Diagnosis not present

## 2022-09-26 DIAGNOSIS — Z992 Dependence on renal dialysis: Secondary | ICD-10-CM | POA: Diagnosis not present

## 2022-09-26 DIAGNOSIS — D631 Anemia in chronic kidney disease: Secondary | ICD-10-CM | POA: Diagnosis not present

## 2022-09-26 DIAGNOSIS — N186 End stage renal disease: Secondary | ICD-10-CM | POA: Diagnosis not present

## 2022-09-27 DIAGNOSIS — D631 Anemia in chronic kidney disease: Secondary | ICD-10-CM | POA: Diagnosis not present

## 2022-09-27 DIAGNOSIS — N2581 Secondary hyperparathyroidism of renal origin: Secondary | ICD-10-CM | POA: Diagnosis not present

## 2022-09-27 DIAGNOSIS — Z992 Dependence on renal dialysis: Secondary | ICD-10-CM | POA: Diagnosis not present

## 2022-09-27 DIAGNOSIS — N186 End stage renal disease: Secondary | ICD-10-CM | POA: Diagnosis not present

## 2022-09-28 DIAGNOSIS — N2581 Secondary hyperparathyroidism of renal origin: Secondary | ICD-10-CM | POA: Diagnosis not present

## 2022-09-28 DIAGNOSIS — D631 Anemia in chronic kidney disease: Secondary | ICD-10-CM | POA: Diagnosis not present

## 2022-09-28 DIAGNOSIS — Z992 Dependence on renal dialysis: Secondary | ICD-10-CM | POA: Diagnosis not present

## 2022-09-28 DIAGNOSIS — N186 End stage renal disease: Secondary | ICD-10-CM | POA: Diagnosis not present

## 2022-09-29 DIAGNOSIS — N186 End stage renal disease: Secondary | ICD-10-CM | POA: Diagnosis not present

## 2022-09-29 DIAGNOSIS — N2581 Secondary hyperparathyroidism of renal origin: Secondary | ICD-10-CM | POA: Diagnosis not present

## 2022-09-29 DIAGNOSIS — D631 Anemia in chronic kidney disease: Secondary | ICD-10-CM | POA: Diagnosis not present

## 2022-09-29 DIAGNOSIS — Z992 Dependence on renal dialysis: Secondary | ICD-10-CM | POA: Diagnosis not present

## 2022-09-30 DIAGNOSIS — N186 End stage renal disease: Secondary | ICD-10-CM | POA: Diagnosis not present

## 2022-09-30 DIAGNOSIS — Z992 Dependence on renal dialysis: Secondary | ICD-10-CM | POA: Diagnosis not present

## 2022-09-30 DIAGNOSIS — D631 Anemia in chronic kidney disease: Secondary | ICD-10-CM | POA: Diagnosis not present

## 2022-09-30 DIAGNOSIS — N2581 Secondary hyperparathyroidism of renal origin: Secondary | ICD-10-CM | POA: Diagnosis not present

## 2022-10-01 DIAGNOSIS — D631 Anemia in chronic kidney disease: Secondary | ICD-10-CM | POA: Diagnosis not present

## 2022-10-01 DIAGNOSIS — Z992 Dependence on renal dialysis: Secondary | ICD-10-CM | POA: Diagnosis not present

## 2022-10-01 DIAGNOSIS — N186 End stage renal disease: Secondary | ICD-10-CM | POA: Diagnosis not present

## 2022-10-01 DIAGNOSIS — N2581 Secondary hyperparathyroidism of renal origin: Secondary | ICD-10-CM | POA: Diagnosis not present

## 2022-10-02 DIAGNOSIS — N186 End stage renal disease: Secondary | ICD-10-CM | POA: Diagnosis not present

## 2022-10-02 DIAGNOSIS — N2581 Secondary hyperparathyroidism of renal origin: Secondary | ICD-10-CM | POA: Diagnosis not present

## 2022-10-02 DIAGNOSIS — D631 Anemia in chronic kidney disease: Secondary | ICD-10-CM | POA: Diagnosis not present

## 2022-10-02 DIAGNOSIS — Z992 Dependence on renal dialysis: Secondary | ICD-10-CM | POA: Diagnosis not present

## 2022-10-03 DIAGNOSIS — N186 End stage renal disease: Secondary | ICD-10-CM | POA: Diagnosis not present

## 2022-10-03 DIAGNOSIS — N2581 Secondary hyperparathyroidism of renal origin: Secondary | ICD-10-CM | POA: Diagnosis not present

## 2022-10-03 DIAGNOSIS — D631 Anemia in chronic kidney disease: Secondary | ICD-10-CM | POA: Diagnosis not present

## 2022-10-03 DIAGNOSIS — Z992 Dependence on renal dialysis: Secondary | ICD-10-CM | POA: Diagnosis not present

## 2022-10-04 DIAGNOSIS — N2581 Secondary hyperparathyroidism of renal origin: Secondary | ICD-10-CM | POA: Diagnosis not present

## 2022-10-04 DIAGNOSIS — Z992 Dependence on renal dialysis: Secondary | ICD-10-CM | POA: Diagnosis not present

## 2022-10-04 DIAGNOSIS — N186 End stage renal disease: Secondary | ICD-10-CM | POA: Diagnosis not present

## 2022-10-04 DIAGNOSIS — D631 Anemia in chronic kidney disease: Secondary | ICD-10-CM | POA: Diagnosis not present

## 2022-10-05 DIAGNOSIS — N186 End stage renal disease: Secondary | ICD-10-CM | POA: Diagnosis not present

## 2022-10-05 DIAGNOSIS — Z992 Dependence on renal dialysis: Secondary | ICD-10-CM | POA: Diagnosis not present

## 2022-10-05 DIAGNOSIS — N2581 Secondary hyperparathyroidism of renal origin: Secondary | ICD-10-CM | POA: Diagnosis not present

## 2022-10-05 DIAGNOSIS — D631 Anemia in chronic kidney disease: Secondary | ICD-10-CM | POA: Diagnosis not present

## 2022-10-06 DIAGNOSIS — N2581 Secondary hyperparathyroidism of renal origin: Secondary | ICD-10-CM | POA: Diagnosis not present

## 2022-10-06 DIAGNOSIS — Z992 Dependence on renal dialysis: Secondary | ICD-10-CM | POA: Diagnosis not present

## 2022-10-06 DIAGNOSIS — D631 Anemia in chronic kidney disease: Secondary | ICD-10-CM | POA: Diagnosis not present

## 2022-10-06 DIAGNOSIS — N186 End stage renal disease: Secondary | ICD-10-CM | POA: Diagnosis not present

## 2022-10-07 DIAGNOSIS — D631 Anemia in chronic kidney disease: Secondary | ICD-10-CM | POA: Diagnosis not present

## 2022-10-07 DIAGNOSIS — Z992 Dependence on renal dialysis: Secondary | ICD-10-CM | POA: Diagnosis not present

## 2022-10-07 DIAGNOSIS — N2581 Secondary hyperparathyroidism of renal origin: Secondary | ICD-10-CM | POA: Diagnosis not present

## 2022-10-07 DIAGNOSIS — N186 End stage renal disease: Secondary | ICD-10-CM | POA: Diagnosis not present

## 2022-10-08 DIAGNOSIS — N186 End stage renal disease: Secondary | ICD-10-CM | POA: Diagnosis not present

## 2022-10-08 DIAGNOSIS — N2581 Secondary hyperparathyroidism of renal origin: Secondary | ICD-10-CM | POA: Diagnosis not present

## 2022-10-08 DIAGNOSIS — Z992 Dependence on renal dialysis: Secondary | ICD-10-CM | POA: Diagnosis not present

## 2022-10-08 DIAGNOSIS — D631 Anemia in chronic kidney disease: Secondary | ICD-10-CM | POA: Diagnosis not present

## 2022-10-09 DIAGNOSIS — Z992 Dependence on renal dialysis: Secondary | ICD-10-CM | POA: Diagnosis not present

## 2022-10-09 DIAGNOSIS — N2581 Secondary hyperparathyroidism of renal origin: Secondary | ICD-10-CM | POA: Diagnosis not present

## 2022-10-09 DIAGNOSIS — D631 Anemia in chronic kidney disease: Secondary | ICD-10-CM | POA: Diagnosis not present

## 2022-10-09 DIAGNOSIS — N186 End stage renal disease: Secondary | ICD-10-CM | POA: Diagnosis not present

## 2022-10-10 DIAGNOSIS — Z992 Dependence on renal dialysis: Secondary | ICD-10-CM | POA: Diagnosis not present

## 2022-10-10 DIAGNOSIS — D631 Anemia in chronic kidney disease: Secondary | ICD-10-CM | POA: Diagnosis not present

## 2022-10-10 DIAGNOSIS — N186 End stage renal disease: Secondary | ICD-10-CM | POA: Diagnosis not present

## 2022-10-10 DIAGNOSIS — N2581 Secondary hyperparathyroidism of renal origin: Secondary | ICD-10-CM | POA: Diagnosis not present

## 2022-10-11 DIAGNOSIS — N186 End stage renal disease: Secondary | ICD-10-CM | POA: Diagnosis not present

## 2022-10-11 DIAGNOSIS — Z992 Dependence on renal dialysis: Secondary | ICD-10-CM | POA: Diagnosis not present

## 2022-10-11 DIAGNOSIS — N2581 Secondary hyperparathyroidism of renal origin: Secondary | ICD-10-CM | POA: Diagnosis not present

## 2022-10-11 DIAGNOSIS — D631 Anemia in chronic kidney disease: Secondary | ICD-10-CM | POA: Diagnosis not present

## 2022-10-12 DIAGNOSIS — D631 Anemia in chronic kidney disease: Secondary | ICD-10-CM | POA: Diagnosis not present

## 2022-10-12 DIAGNOSIS — Z992 Dependence on renal dialysis: Secondary | ICD-10-CM | POA: Diagnosis not present

## 2022-10-12 DIAGNOSIS — N186 End stage renal disease: Secondary | ICD-10-CM | POA: Diagnosis not present

## 2022-10-12 DIAGNOSIS — N2581 Secondary hyperparathyroidism of renal origin: Secondary | ICD-10-CM | POA: Diagnosis not present

## 2022-10-13 DIAGNOSIS — Z992 Dependence on renal dialysis: Secondary | ICD-10-CM | POA: Diagnosis not present

## 2022-10-13 DIAGNOSIS — N186 End stage renal disease: Secondary | ICD-10-CM | POA: Diagnosis not present

## 2022-10-13 DIAGNOSIS — D631 Anemia in chronic kidney disease: Secondary | ICD-10-CM | POA: Diagnosis not present

## 2022-10-13 DIAGNOSIS — N2581 Secondary hyperparathyroidism of renal origin: Secondary | ICD-10-CM | POA: Diagnosis not present

## 2022-10-14 DIAGNOSIS — N2581 Secondary hyperparathyroidism of renal origin: Secondary | ICD-10-CM | POA: Diagnosis not present

## 2022-10-14 DIAGNOSIS — N186 End stage renal disease: Secondary | ICD-10-CM | POA: Diagnosis not present

## 2022-10-14 DIAGNOSIS — D631 Anemia in chronic kidney disease: Secondary | ICD-10-CM | POA: Diagnosis not present

## 2022-10-14 DIAGNOSIS — Z992 Dependence on renal dialysis: Secondary | ICD-10-CM | POA: Diagnosis not present

## 2022-10-15 DIAGNOSIS — D631 Anemia in chronic kidney disease: Secondary | ICD-10-CM | POA: Diagnosis not present

## 2022-10-15 DIAGNOSIS — Z992 Dependence on renal dialysis: Secondary | ICD-10-CM | POA: Diagnosis not present

## 2022-10-15 DIAGNOSIS — N2581 Secondary hyperparathyroidism of renal origin: Secondary | ICD-10-CM | POA: Diagnosis not present

## 2022-10-15 DIAGNOSIS — N186 End stage renal disease: Secondary | ICD-10-CM | POA: Diagnosis not present

## 2022-10-16 DIAGNOSIS — Z992 Dependence on renal dialysis: Secondary | ICD-10-CM | POA: Diagnosis not present

## 2022-10-16 DIAGNOSIS — D631 Anemia in chronic kidney disease: Secondary | ICD-10-CM | POA: Diagnosis not present

## 2022-10-16 DIAGNOSIS — N186 End stage renal disease: Secondary | ICD-10-CM | POA: Diagnosis not present

## 2022-10-16 DIAGNOSIS — N2581 Secondary hyperparathyroidism of renal origin: Secondary | ICD-10-CM | POA: Diagnosis not present

## 2022-10-17 DIAGNOSIS — N186 End stage renal disease: Secondary | ICD-10-CM | POA: Diagnosis not present

## 2022-10-17 DIAGNOSIS — N2581 Secondary hyperparathyroidism of renal origin: Secondary | ICD-10-CM | POA: Diagnosis not present

## 2022-10-17 DIAGNOSIS — Z992 Dependence on renal dialysis: Secondary | ICD-10-CM | POA: Diagnosis not present

## 2022-10-17 DIAGNOSIS — D631 Anemia in chronic kidney disease: Secondary | ICD-10-CM | POA: Diagnosis not present

## 2022-10-18 DIAGNOSIS — N2581 Secondary hyperparathyroidism of renal origin: Secondary | ICD-10-CM | POA: Diagnosis not present

## 2022-10-18 DIAGNOSIS — N186 End stage renal disease: Secondary | ICD-10-CM | POA: Diagnosis not present

## 2022-10-18 DIAGNOSIS — Z992 Dependence on renal dialysis: Secondary | ICD-10-CM | POA: Diagnosis not present

## 2022-10-18 DIAGNOSIS — D631 Anemia in chronic kidney disease: Secondary | ICD-10-CM | POA: Diagnosis not present

## 2022-10-19 DIAGNOSIS — D631 Anemia in chronic kidney disease: Secondary | ICD-10-CM | POA: Diagnosis not present

## 2022-10-19 DIAGNOSIS — N2581 Secondary hyperparathyroidism of renal origin: Secondary | ICD-10-CM | POA: Diagnosis not present

## 2022-10-19 DIAGNOSIS — Z992 Dependence on renal dialysis: Secondary | ICD-10-CM | POA: Diagnosis not present

## 2022-10-19 DIAGNOSIS — N186 End stage renal disease: Secondary | ICD-10-CM | POA: Diagnosis not present

## 2022-10-20 DIAGNOSIS — N2581 Secondary hyperparathyroidism of renal origin: Secondary | ICD-10-CM | POA: Diagnosis not present

## 2022-10-20 DIAGNOSIS — N186 End stage renal disease: Secondary | ICD-10-CM | POA: Diagnosis not present

## 2022-10-20 DIAGNOSIS — D631 Anemia in chronic kidney disease: Secondary | ICD-10-CM | POA: Diagnosis not present

## 2022-10-20 DIAGNOSIS — Z992 Dependence on renal dialysis: Secondary | ICD-10-CM | POA: Diagnosis not present

## 2022-10-21 DIAGNOSIS — N186 End stage renal disease: Secondary | ICD-10-CM | POA: Diagnosis not present

## 2022-10-21 DIAGNOSIS — D631 Anemia in chronic kidney disease: Secondary | ICD-10-CM | POA: Diagnosis not present

## 2022-10-21 DIAGNOSIS — N2581 Secondary hyperparathyroidism of renal origin: Secondary | ICD-10-CM | POA: Diagnosis not present

## 2022-10-21 DIAGNOSIS — Z992 Dependence on renal dialysis: Secondary | ICD-10-CM | POA: Diagnosis not present

## 2022-10-22 DIAGNOSIS — N2581 Secondary hyperparathyroidism of renal origin: Secondary | ICD-10-CM | POA: Diagnosis not present

## 2022-10-22 DIAGNOSIS — D631 Anemia in chronic kidney disease: Secondary | ICD-10-CM | POA: Diagnosis not present

## 2022-10-22 DIAGNOSIS — Z992 Dependence on renal dialysis: Secondary | ICD-10-CM | POA: Diagnosis not present

## 2022-10-22 DIAGNOSIS — N186 End stage renal disease: Secondary | ICD-10-CM | POA: Diagnosis not present

## 2022-10-23 DIAGNOSIS — N2581 Secondary hyperparathyroidism of renal origin: Secondary | ICD-10-CM | POA: Diagnosis not present

## 2022-10-23 DIAGNOSIS — Z992 Dependence on renal dialysis: Secondary | ICD-10-CM | POA: Diagnosis not present

## 2022-10-23 DIAGNOSIS — D631 Anemia in chronic kidney disease: Secondary | ICD-10-CM | POA: Diagnosis not present

## 2022-10-23 DIAGNOSIS — N186 End stage renal disease: Secondary | ICD-10-CM | POA: Diagnosis not present

## 2022-10-24 DIAGNOSIS — D631 Anemia in chronic kidney disease: Secondary | ICD-10-CM | POA: Diagnosis not present

## 2022-10-24 DIAGNOSIS — Z992 Dependence on renal dialysis: Secondary | ICD-10-CM | POA: Diagnosis not present

## 2022-10-24 DIAGNOSIS — N186 End stage renal disease: Secondary | ICD-10-CM | POA: Diagnosis not present

## 2022-10-24 DIAGNOSIS — N2581 Secondary hyperparathyroidism of renal origin: Secondary | ICD-10-CM | POA: Diagnosis not present

## 2022-10-25 DIAGNOSIS — D631 Anemia in chronic kidney disease: Secondary | ICD-10-CM | POA: Diagnosis not present

## 2022-10-25 DIAGNOSIS — N2581 Secondary hyperparathyroidism of renal origin: Secondary | ICD-10-CM | POA: Diagnosis not present

## 2022-10-25 DIAGNOSIS — Z992 Dependence on renal dialysis: Secondary | ICD-10-CM | POA: Diagnosis not present

## 2022-10-25 DIAGNOSIS — N186 End stage renal disease: Secondary | ICD-10-CM | POA: Diagnosis not present

## 2022-10-26 DIAGNOSIS — N186 End stage renal disease: Secondary | ICD-10-CM | POA: Diagnosis not present

## 2022-10-26 DIAGNOSIS — Z992 Dependence on renal dialysis: Secondary | ICD-10-CM | POA: Diagnosis not present

## 2022-10-26 DIAGNOSIS — D631 Anemia in chronic kidney disease: Secondary | ICD-10-CM | POA: Diagnosis not present

## 2022-10-26 DIAGNOSIS — N2581 Secondary hyperparathyroidism of renal origin: Secondary | ICD-10-CM | POA: Diagnosis not present

## 2022-10-27 DIAGNOSIS — Z992 Dependence on renal dialysis: Secondary | ICD-10-CM | POA: Diagnosis not present

## 2022-10-27 DIAGNOSIS — N186 End stage renal disease: Secondary | ICD-10-CM | POA: Diagnosis not present

## 2022-10-27 DIAGNOSIS — D631 Anemia in chronic kidney disease: Secondary | ICD-10-CM | POA: Diagnosis not present

## 2022-10-27 DIAGNOSIS — N2581 Secondary hyperparathyroidism of renal origin: Secondary | ICD-10-CM | POA: Diagnosis not present

## 2022-10-28 DIAGNOSIS — Z992 Dependence on renal dialysis: Secondary | ICD-10-CM | POA: Diagnosis not present

## 2022-10-28 DIAGNOSIS — D631 Anemia in chronic kidney disease: Secondary | ICD-10-CM | POA: Diagnosis not present

## 2022-10-28 DIAGNOSIS — N186 End stage renal disease: Secondary | ICD-10-CM | POA: Diagnosis not present

## 2022-10-28 DIAGNOSIS — N2581 Secondary hyperparathyroidism of renal origin: Secondary | ICD-10-CM | POA: Diagnosis not present

## 2022-10-29 DIAGNOSIS — Z992 Dependence on renal dialysis: Secondary | ICD-10-CM | POA: Diagnosis not present

## 2022-10-29 DIAGNOSIS — D631 Anemia in chronic kidney disease: Secondary | ICD-10-CM | POA: Diagnosis not present

## 2022-10-29 DIAGNOSIS — N186 End stage renal disease: Secondary | ICD-10-CM | POA: Diagnosis not present

## 2022-10-29 DIAGNOSIS — N2581 Secondary hyperparathyroidism of renal origin: Secondary | ICD-10-CM | POA: Diagnosis not present

## 2022-10-30 DIAGNOSIS — N2581 Secondary hyperparathyroidism of renal origin: Secondary | ICD-10-CM | POA: Diagnosis not present

## 2022-10-30 DIAGNOSIS — N186 End stage renal disease: Secondary | ICD-10-CM | POA: Diagnosis not present

## 2022-10-30 DIAGNOSIS — Z992 Dependence on renal dialysis: Secondary | ICD-10-CM | POA: Diagnosis not present

## 2022-10-30 DIAGNOSIS — D631 Anemia in chronic kidney disease: Secondary | ICD-10-CM | POA: Diagnosis not present

## 2022-10-31 DIAGNOSIS — Z992 Dependence on renal dialysis: Secondary | ICD-10-CM | POA: Diagnosis not present

## 2022-10-31 DIAGNOSIS — N2581 Secondary hyperparathyroidism of renal origin: Secondary | ICD-10-CM | POA: Diagnosis not present

## 2022-10-31 DIAGNOSIS — D631 Anemia in chronic kidney disease: Secondary | ICD-10-CM | POA: Diagnosis not present

## 2022-10-31 DIAGNOSIS — N186 End stage renal disease: Secondary | ICD-10-CM | POA: Diagnosis not present

## 2022-11-01 DIAGNOSIS — N2581 Secondary hyperparathyroidism of renal origin: Secondary | ICD-10-CM | POA: Diagnosis not present

## 2022-11-01 DIAGNOSIS — N186 End stage renal disease: Secondary | ICD-10-CM | POA: Diagnosis not present

## 2022-11-01 DIAGNOSIS — Z992 Dependence on renal dialysis: Secondary | ICD-10-CM | POA: Diagnosis not present

## 2022-11-01 DIAGNOSIS — D631 Anemia in chronic kidney disease: Secondary | ICD-10-CM | POA: Diagnosis not present

## 2022-11-02 DIAGNOSIS — Z992 Dependence on renal dialysis: Secondary | ICD-10-CM | POA: Diagnosis not present

## 2022-11-02 DIAGNOSIS — D631 Anemia in chronic kidney disease: Secondary | ICD-10-CM | POA: Diagnosis not present

## 2022-11-02 DIAGNOSIS — N186 End stage renal disease: Secondary | ICD-10-CM | POA: Diagnosis not present

## 2022-11-02 DIAGNOSIS — N2581 Secondary hyperparathyroidism of renal origin: Secondary | ICD-10-CM | POA: Diagnosis not present

## 2022-11-03 DIAGNOSIS — N186 End stage renal disease: Secondary | ICD-10-CM | POA: Diagnosis not present

## 2022-11-03 DIAGNOSIS — N2581 Secondary hyperparathyroidism of renal origin: Secondary | ICD-10-CM | POA: Diagnosis not present

## 2022-11-03 DIAGNOSIS — D631 Anemia in chronic kidney disease: Secondary | ICD-10-CM | POA: Diagnosis not present

## 2022-11-03 DIAGNOSIS — Z992 Dependence on renal dialysis: Secondary | ICD-10-CM | POA: Diagnosis not present

## 2022-11-04 DIAGNOSIS — N186 End stage renal disease: Secondary | ICD-10-CM | POA: Diagnosis not present

## 2022-11-04 DIAGNOSIS — N2581 Secondary hyperparathyroidism of renal origin: Secondary | ICD-10-CM | POA: Diagnosis not present

## 2022-11-04 DIAGNOSIS — D631 Anemia in chronic kidney disease: Secondary | ICD-10-CM | POA: Diagnosis not present

## 2022-11-04 DIAGNOSIS — Z992 Dependence on renal dialysis: Secondary | ICD-10-CM | POA: Diagnosis not present

## 2022-11-05 DIAGNOSIS — Z992 Dependence on renal dialysis: Secondary | ICD-10-CM | POA: Diagnosis not present

## 2022-11-05 DIAGNOSIS — N2581 Secondary hyperparathyroidism of renal origin: Secondary | ICD-10-CM | POA: Diagnosis not present

## 2022-11-05 DIAGNOSIS — D631 Anemia in chronic kidney disease: Secondary | ICD-10-CM | POA: Diagnosis not present

## 2022-11-05 DIAGNOSIS — N186 End stage renal disease: Secondary | ICD-10-CM | POA: Diagnosis not present

## 2022-11-06 DIAGNOSIS — Z992 Dependence on renal dialysis: Secondary | ICD-10-CM | POA: Diagnosis not present

## 2022-11-06 DIAGNOSIS — N2581 Secondary hyperparathyroidism of renal origin: Secondary | ICD-10-CM | POA: Diagnosis not present

## 2022-11-06 DIAGNOSIS — N186 End stage renal disease: Secondary | ICD-10-CM | POA: Diagnosis not present

## 2022-11-06 DIAGNOSIS — D631 Anemia in chronic kidney disease: Secondary | ICD-10-CM | POA: Diagnosis not present

## 2022-11-07 DIAGNOSIS — N2581 Secondary hyperparathyroidism of renal origin: Secondary | ICD-10-CM | POA: Diagnosis not present

## 2022-11-07 DIAGNOSIS — Z992 Dependence on renal dialysis: Secondary | ICD-10-CM | POA: Diagnosis not present

## 2022-11-07 DIAGNOSIS — N186 End stage renal disease: Secondary | ICD-10-CM | POA: Diagnosis not present

## 2022-11-07 DIAGNOSIS — D631 Anemia in chronic kidney disease: Secondary | ICD-10-CM | POA: Diagnosis not present

## 2023-06-30 IMAGING — CT CT CHEST W/O CM
2 of 4 series · 15 of 36 positions shown, 18 images · non-contrast
Comparison: No prior chest CT

CLINICAL DATA: Cough, persistent

EXAM:
CT CHEST WITHOUT CONTRAST
TECHNIQUE: Multidetector CT imaging of the chest was performed following the
standard protocol without IV contrast.

[Series 2: thorax · axial · 0.65mm/px · z∈[-947,-639]mm · 12 of 182 slices shown, 15 images]
[im 14/182  mediastinal]
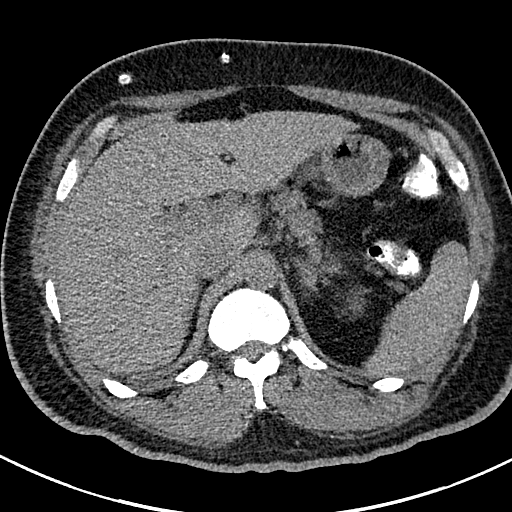
[im 14/182  lung]
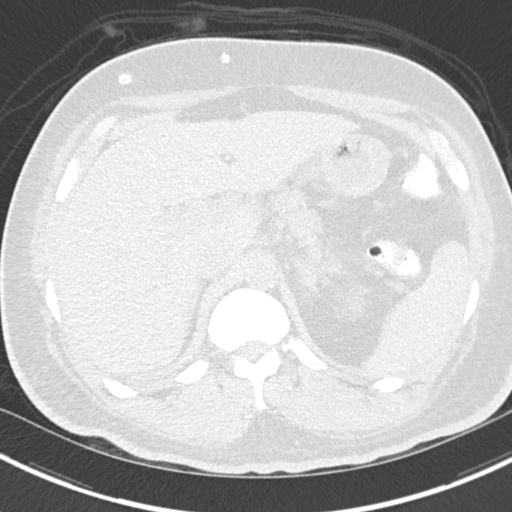
[im 28/182  lung]
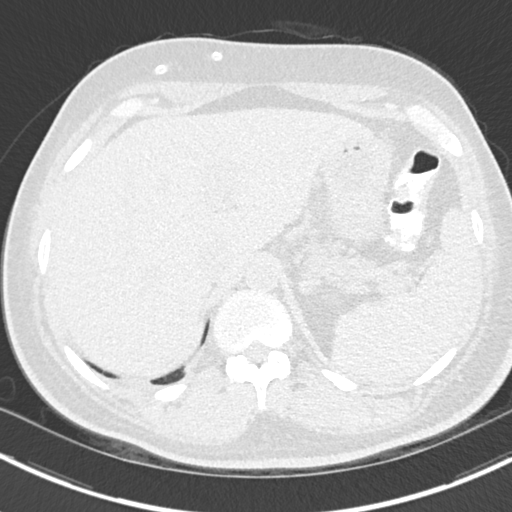
[im 42/182  lung]
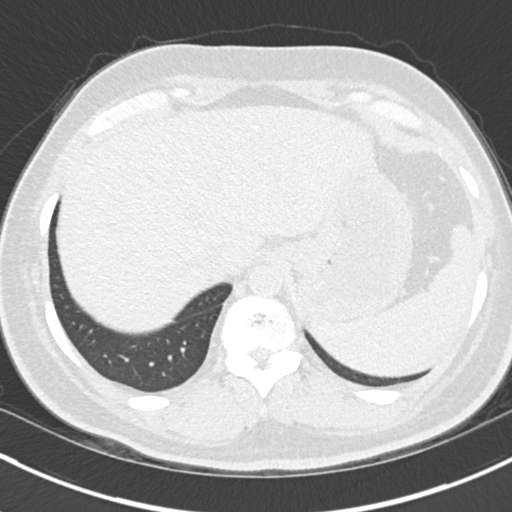
[im 56/182  lung]
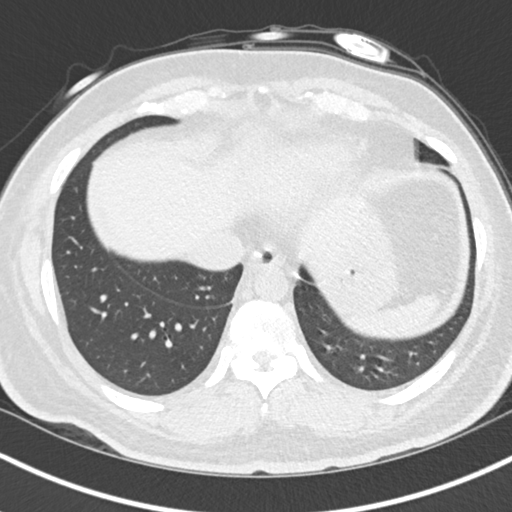
[im 70/182  mediastinal]
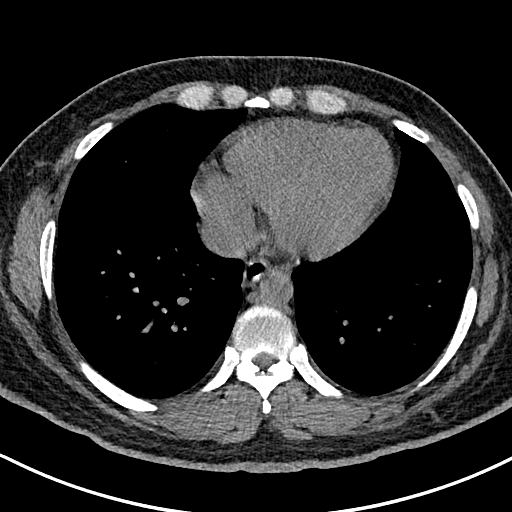
[im 70/182  lung]
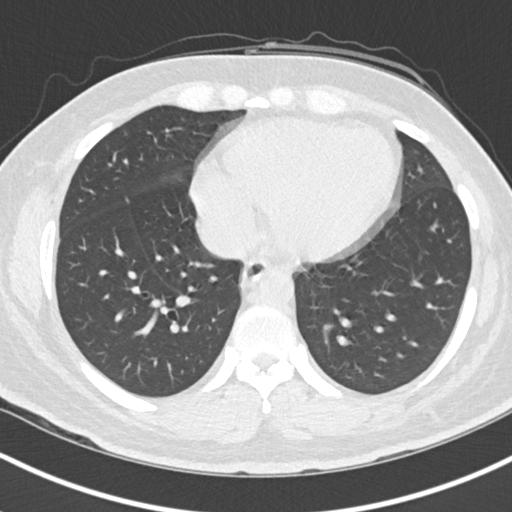
[im 84/182  lung]
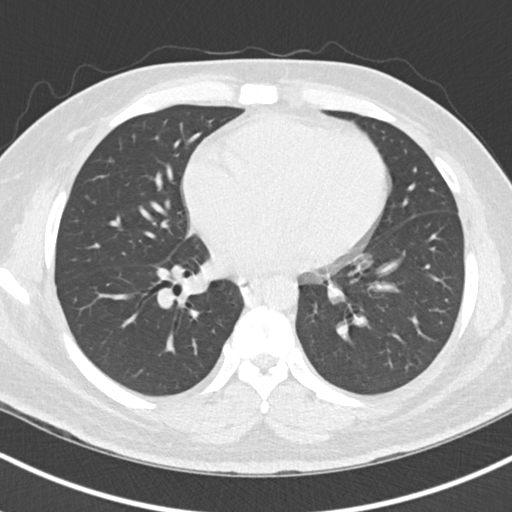
[im 98/182  lung]
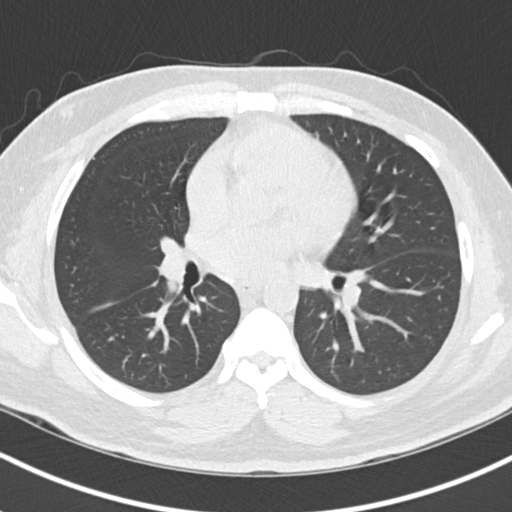
[im 112/182  lung]
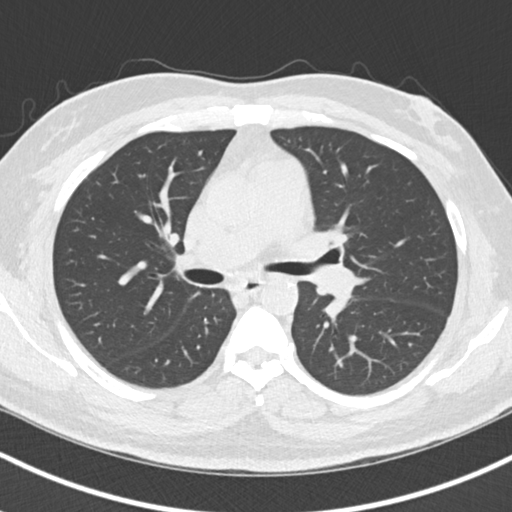
[im 126/182  mediastinal]
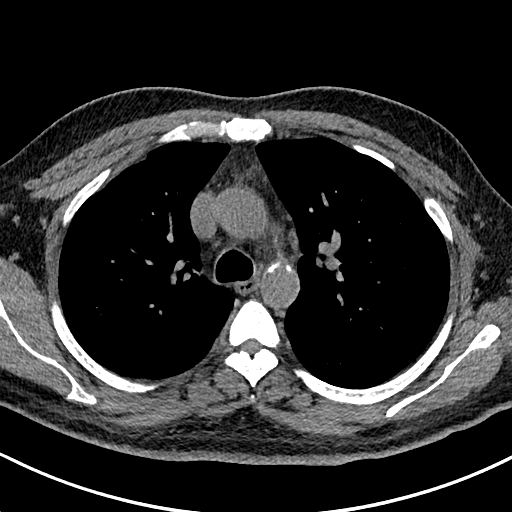
[im 126/182  lung]
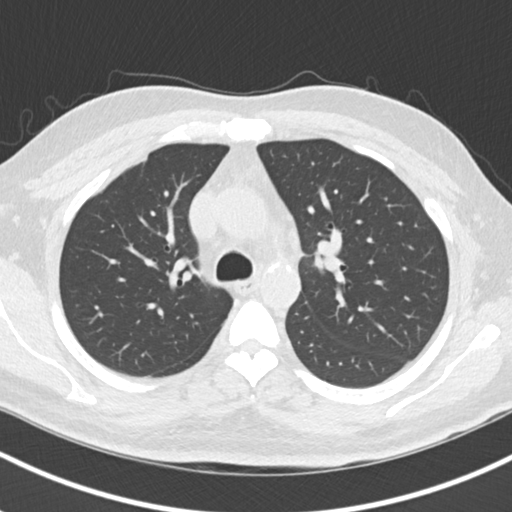
[im 140/182  lung]
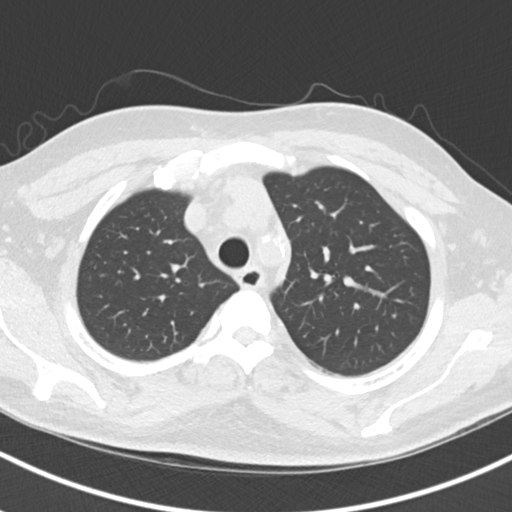
[im 154/182  lung]
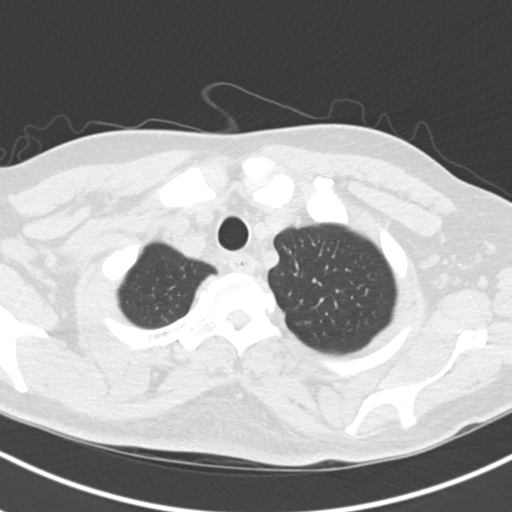
[im 168/182  lung]
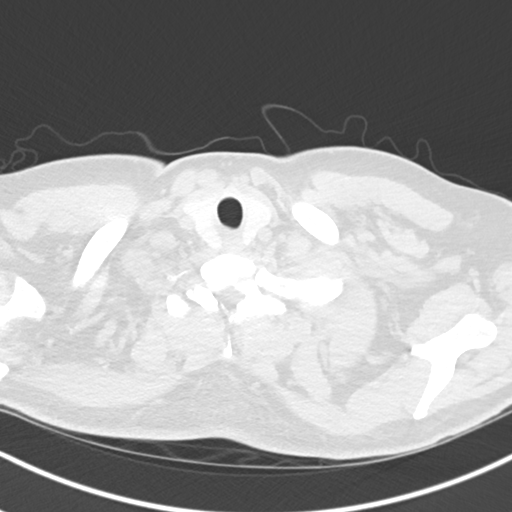

[Series 5: coronal · coronal · 0.76mm/px · 3 of 134 slices shown]
[im 27/134  lung]
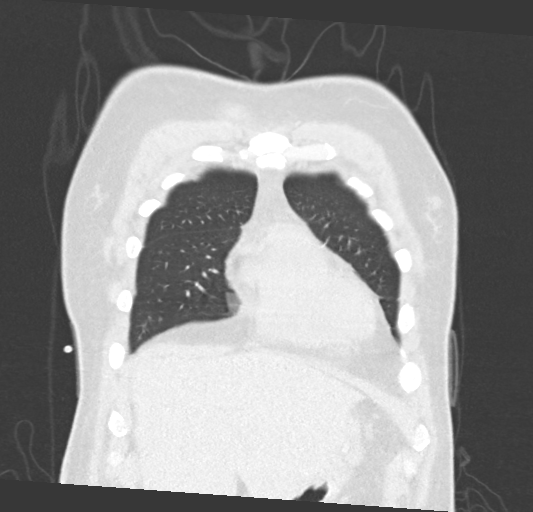
[im 54/134  lung]
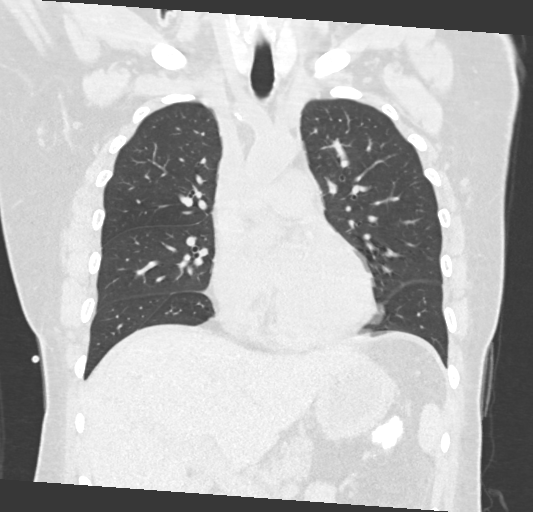
[im 80/134  lung]
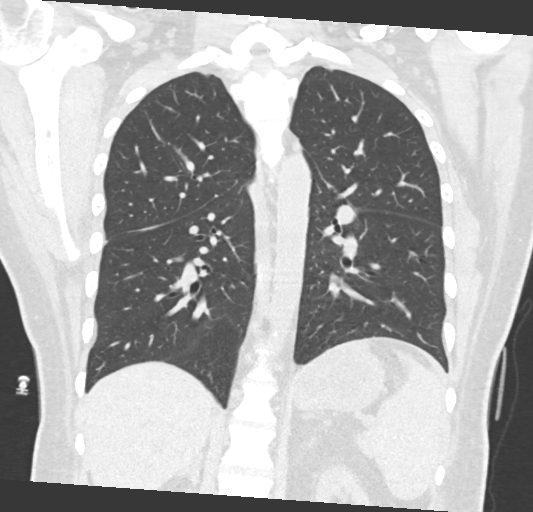

[15 of 36 positions shown; findings below may reference images not displayed]

FINDINGS: Cardiovascular: Moderate coronary and mild aortic atherosclerotic
calcifications. No significant vascular findings. The heart is
normal in size. No pericardial effusion.

Mediastinum/Nodes: No enlarged mediastinal or axillary lymph nodes.
Thyroid gland, trachea, and esophagus demonstrate no significant
findings.

Lungs/Pleura: No focal pulmonary opacity. No pleural effusion or
pneumothorax.

Upper Abdomen: Please see same day CT abdomen pelvis.

Musculoskeletal: No chest wall mass or suspicious bone lesions
identified.
IMPRESSION: 1. No acute process in the chest. No etiology is seen for the
patient's cough.

2. Aortic Atherosclerosis ([id number]). Coronary artery
atherosclerosis.

## 2023-06-30 IMAGING — DX DG FOOT COMPLETE 3+V*L*
3 series · 3 of 3 positions shown · non-contrast
Comparison: Left foot MRI 02/01/2017, left foot radiograph
01/31/2017

CLINICAL DATA: Swelling, concern for osteomyelitis

EXAM:
LEFT FOOT - COMPLETE 3+ VIEW

[foot ap]
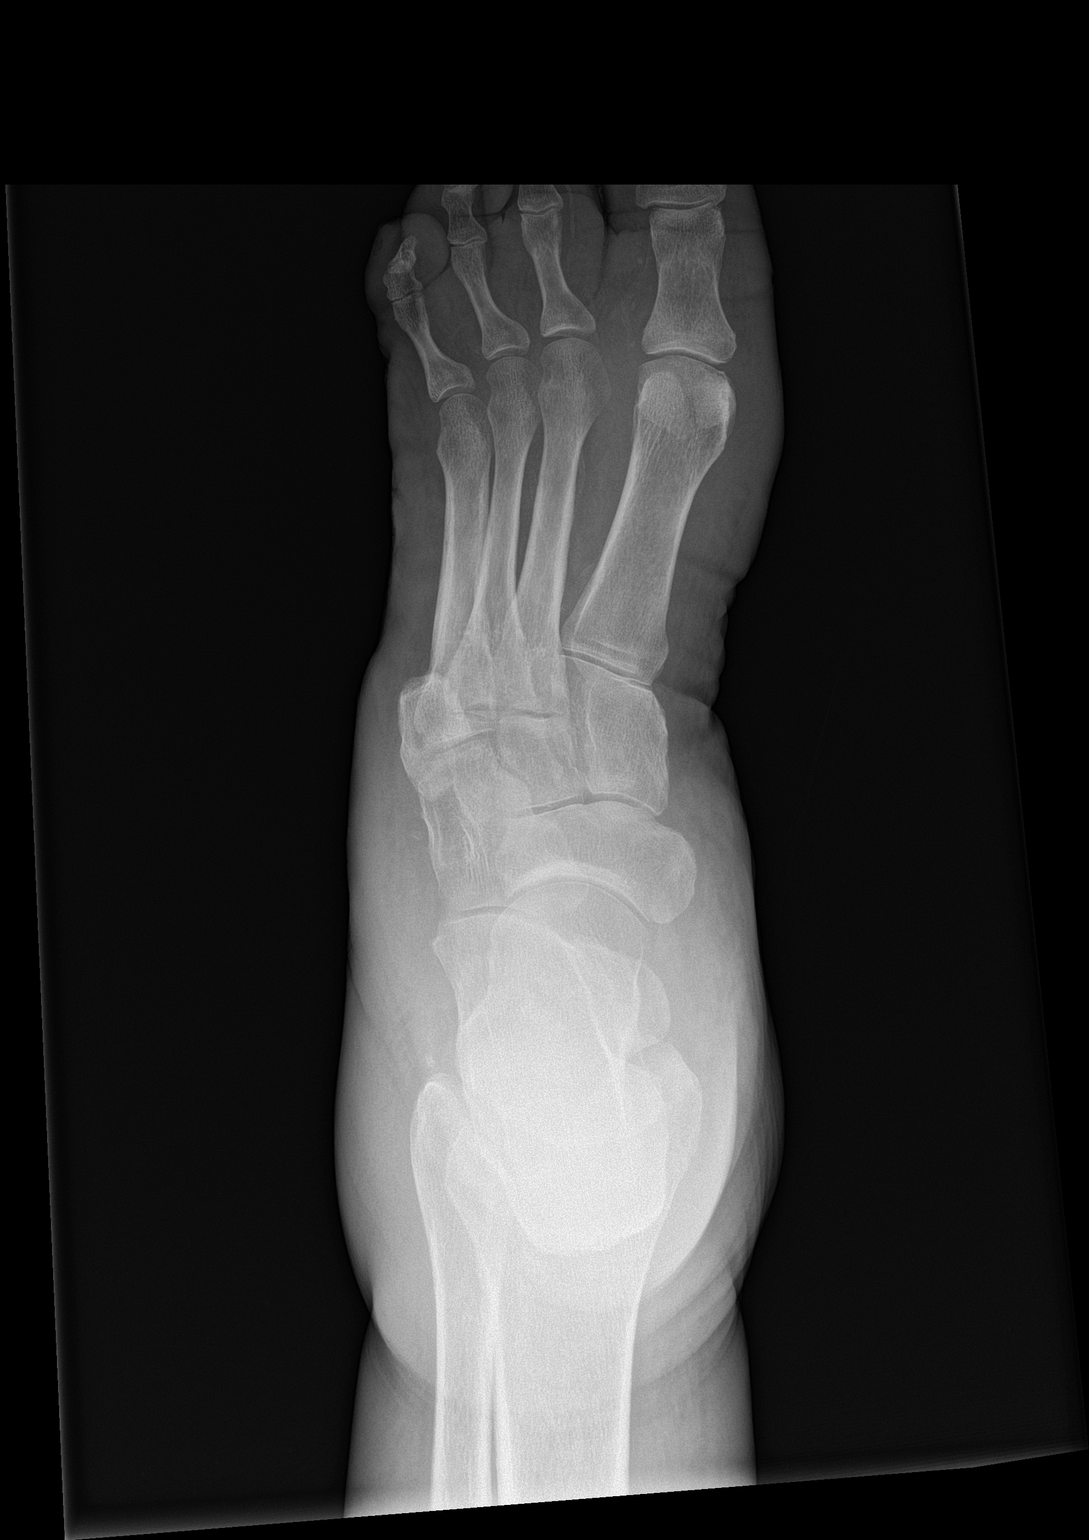

[foot obl]
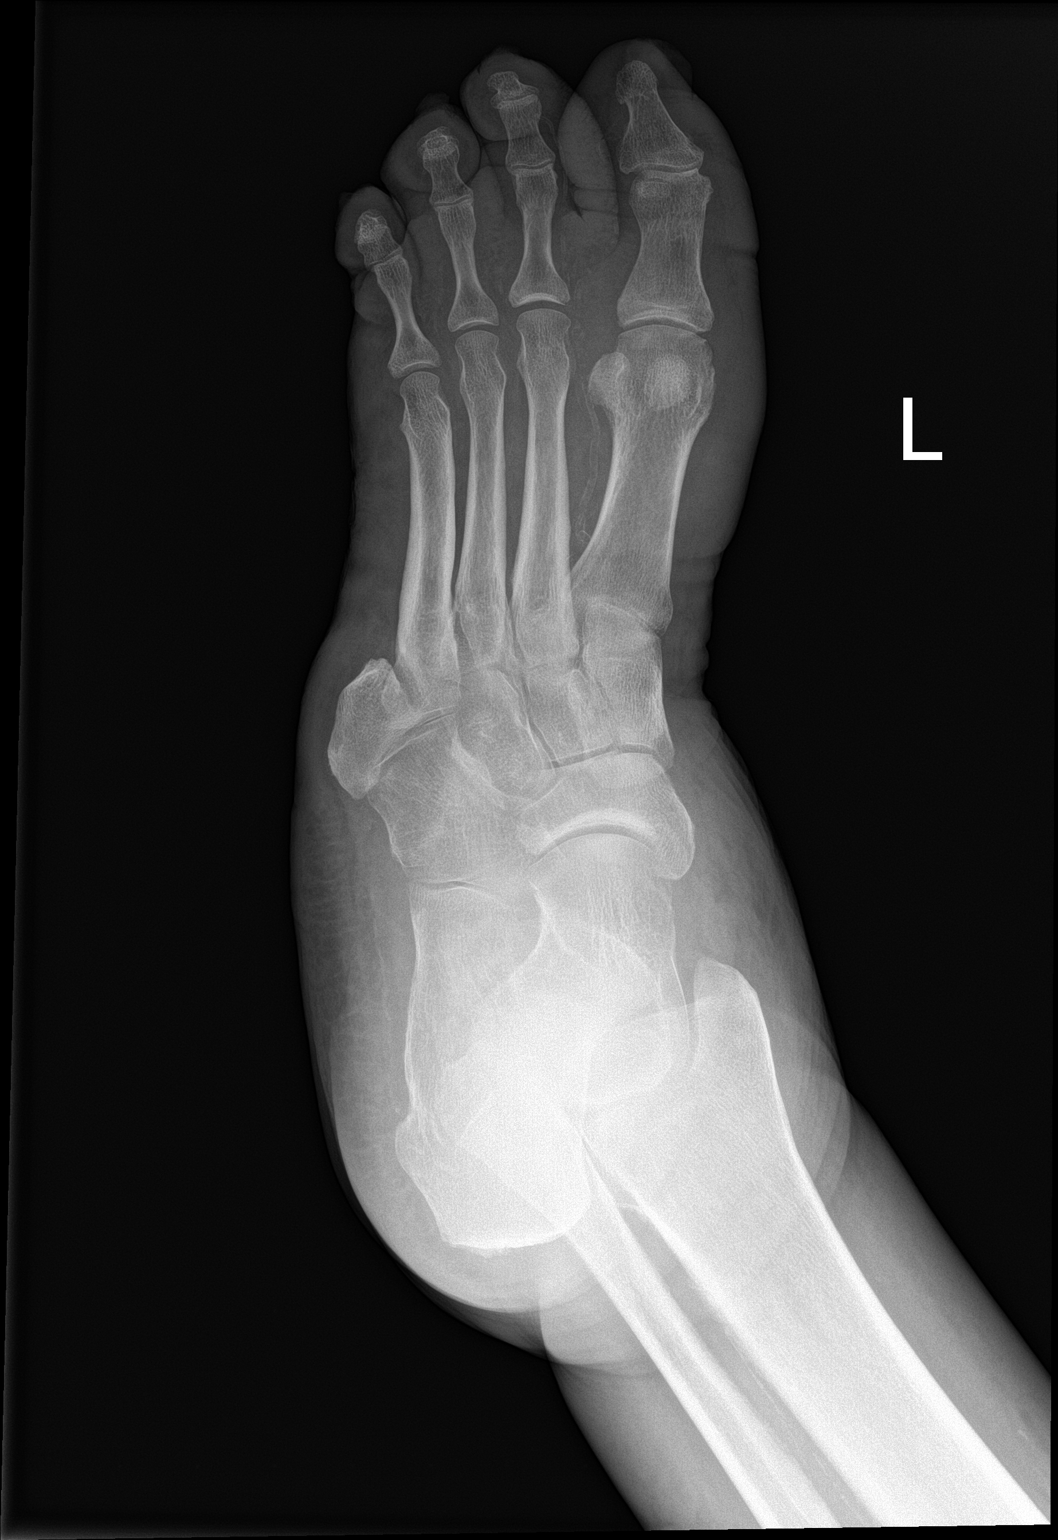

[foot lat]
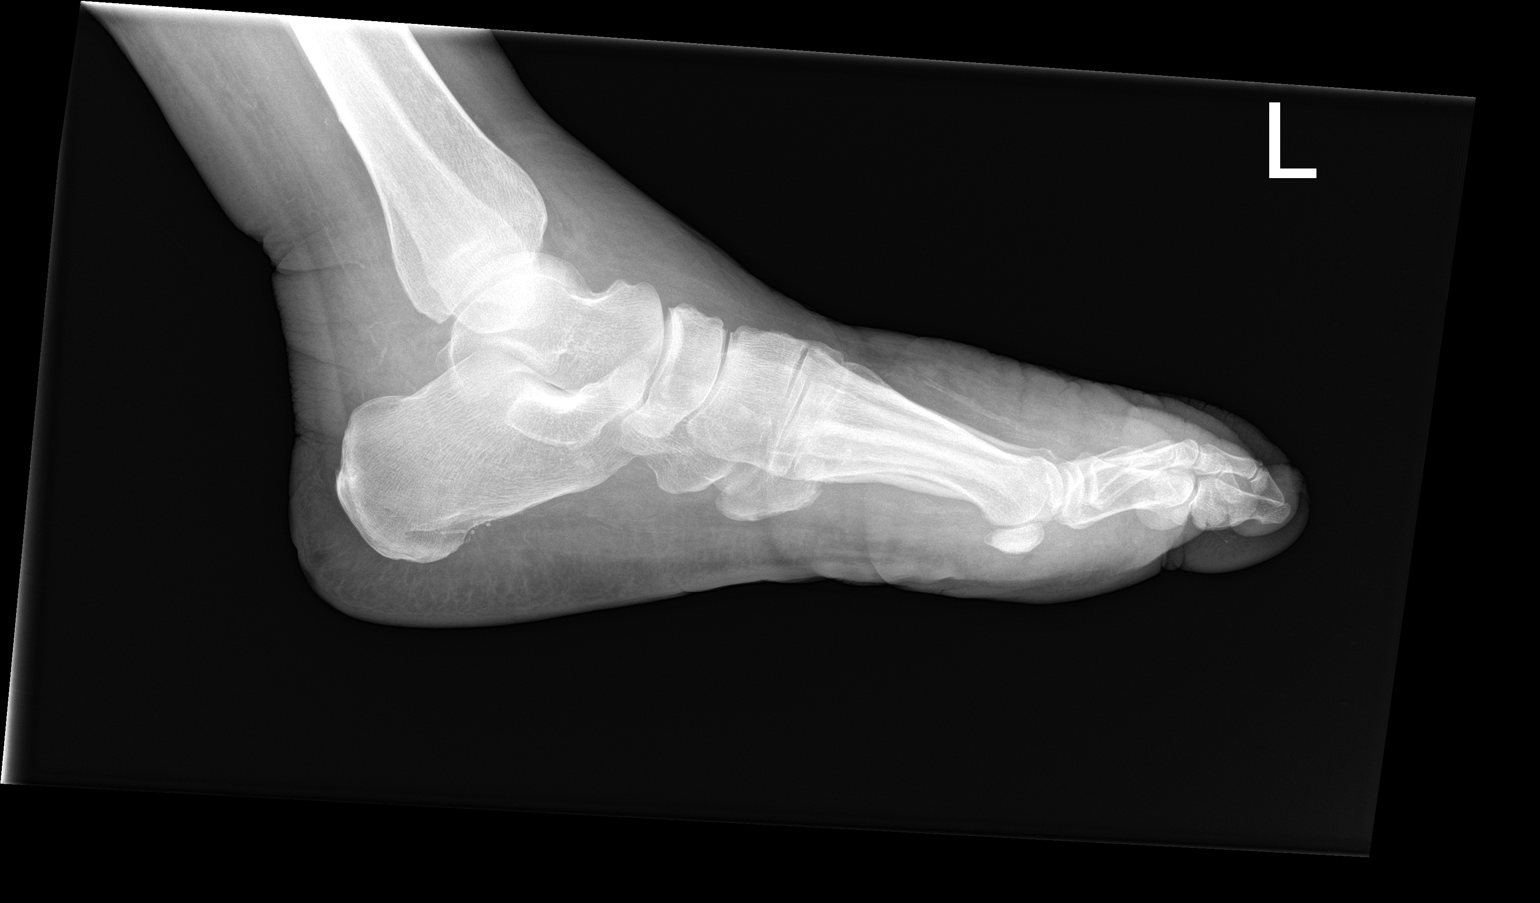

[3 of 3 positions shown; findings below may reference images not displayed]

FINDINGS: The patient is status post amputation of most of the fifth toe
metatarsal as well as the fifth toe. The remaining portion of the
metatarsal is normal in appearance. There is no osseous destruction
or erosion in the foot. There is no acute fracture or dislocation.
Alignment is normal. The joint spaces are preserved. Vascular
calcifications are noted. There is diffuse soft tissue swelling but
no soft tissue gas.
IMPRESSION: Postsurgical changes in the foot as above. There is no osseous
erosion or destruction identified. There is diffuse soft tissue
swelling without soft tissue gas. If there is persistent clinical
concern for osteomyelitis, MRI may be considered.

## 2023-06-30 IMAGING — DX DG CHEST 1V PORT
1 series · 1 of 1 positions shown · non-contrast
Comparison: January 31, 2017.

CLINICAL DATA: Questionable sepsis - evaluate for abnormality

EXAM:
PORTABLE CHEST 1 VIEW

[chest ap]
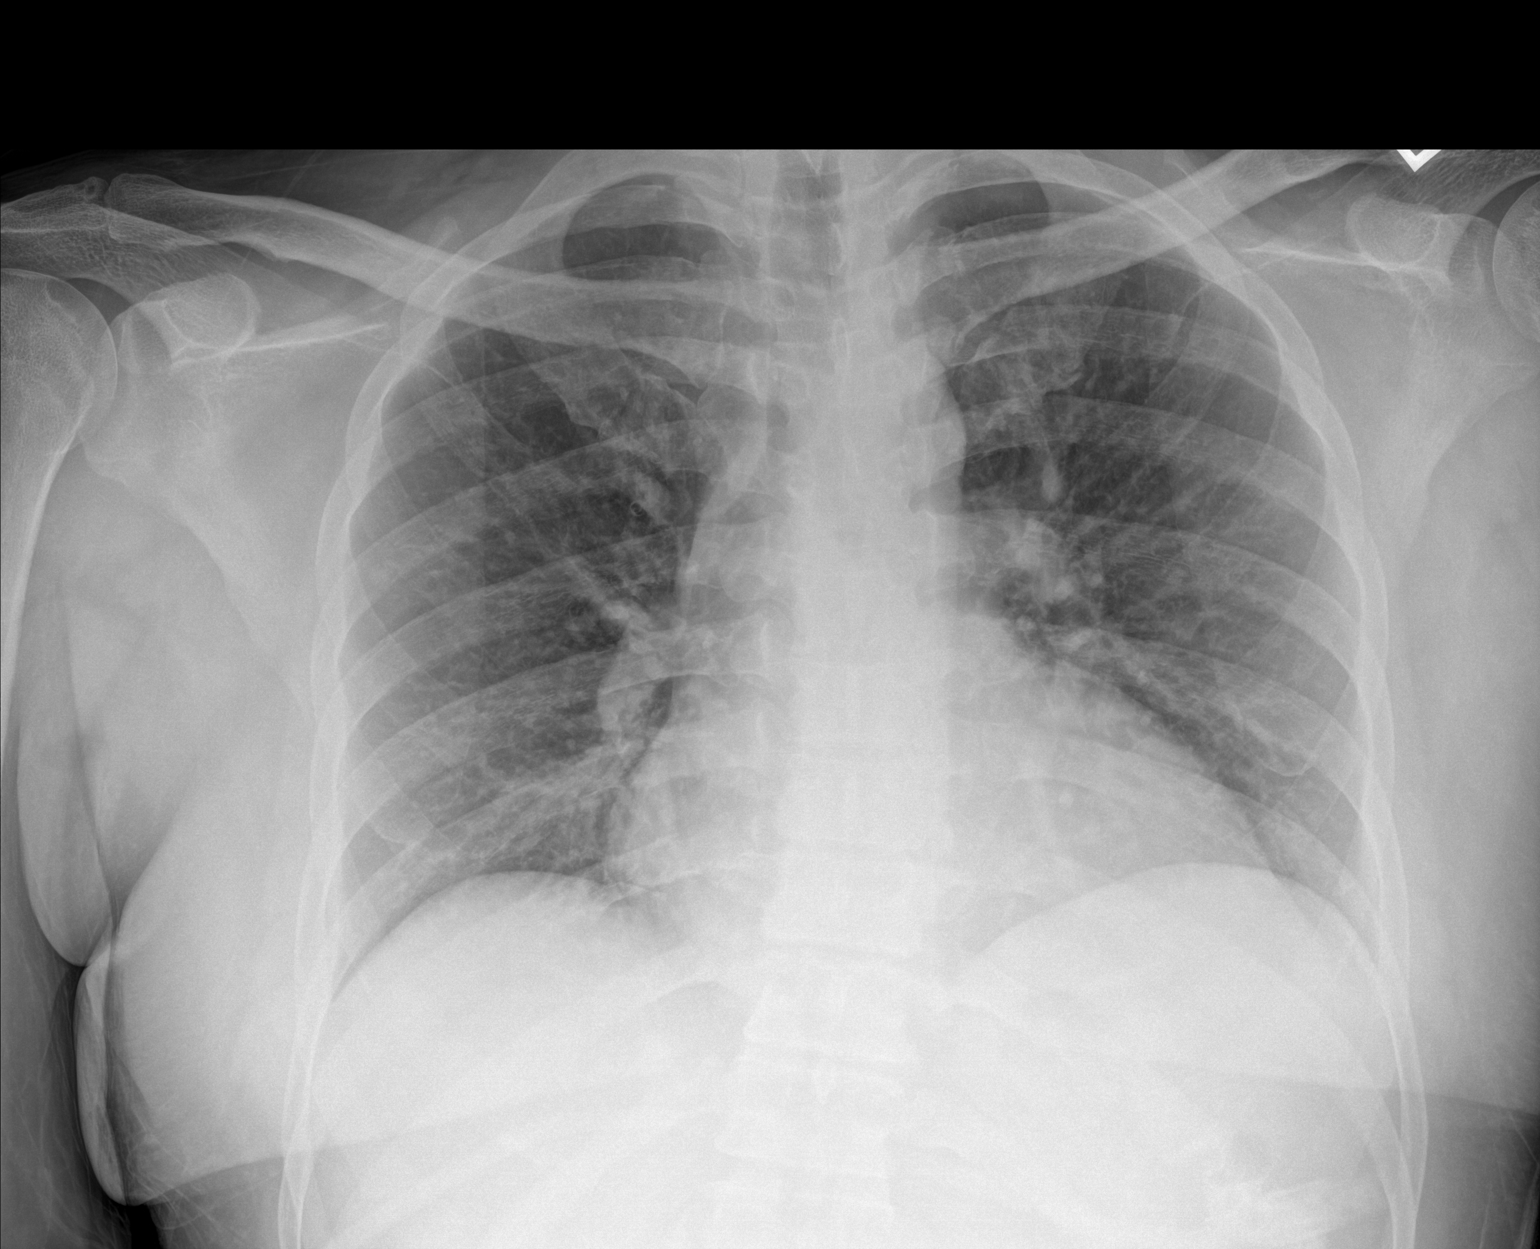

[1 of 1 positions shown; findings below may reference images not displayed]

FINDINGS: Borderline enlargement of the cardiac silhouette, likely accentuated
by AP technique. Both lungs are clear. No visible pleural effusions
or pneumothorax. No acute osseous abnormality.
IMPRESSION: No evidence of acute cardiopulmonary disease.
